# Patient Record
Sex: Male | Born: 1955 | Hispanic: No | Marital: Married | Smoking: Former smoker
Health system: Southern US, Community
[De-identification: ages and names within clinical notes are randomized; demographics above are authoritative.]

## PROBLEM LIST (undated history)

## (undated) DIAGNOSIS — E1169 Type 2 diabetes mellitus with other specified complication: Secondary | ICD-10-CM

## (undated) DIAGNOSIS — M549 Dorsalgia, unspecified: Secondary | ICD-10-CM

## (undated) DIAGNOSIS — M1990 Unspecified osteoarthritis, unspecified site: Secondary | ICD-10-CM

## (undated) DIAGNOSIS — F329 Major depressive disorder, single episode, unspecified: Secondary | ICD-10-CM

## (undated) DIAGNOSIS — F32A Depression, unspecified: Secondary | ICD-10-CM

## (undated) DIAGNOSIS — G4733 Obstructive sleep apnea (adult) (pediatric): Secondary | ICD-10-CM

## (undated) DIAGNOSIS — Z205 Contact with and (suspected) exposure to viral hepatitis: Secondary | ICD-10-CM

## (undated) DIAGNOSIS — F1110 Opioid abuse, uncomplicated: Secondary | ICD-10-CM

## (undated) DIAGNOSIS — K2270 Barrett's esophagus without dysplasia: Secondary | ICD-10-CM

## (undated) DIAGNOSIS — E669 Obesity, unspecified: Secondary | ICD-10-CM

## (undated) DIAGNOSIS — J301 Allergic rhinitis due to pollen: Secondary | ICD-10-CM

## (undated) HISTORY — DX: Depression, unspecified: F32.A

## (undated) HISTORY — PX: ABDOMINAL SURGERY: SHX537

## (undated) HISTORY — PX: BACK SURGERY: SHX140

## (undated) HISTORY — PX: HERNIA REPAIR: SHX51

## (undated) HISTORY — DX: Contact with and (suspected) exposure to viral hepatitis: Z20.5

## (undated) HISTORY — DX: Obstructive sleep apnea (adult) (pediatric): G47.33

## (undated) HISTORY — DX: Barrett's esophagus without dysplasia: K22.70

## (undated) HISTORY — PX: COLONOSCOPY: SHX174

## (undated) HISTORY — PX: ESOPHAGOGASTRODUODENOSCOPY: SHX1529

## (undated) HISTORY — DX: Allergic rhinitis due to pollen: J30.1

## (undated) HISTORY — PX: JOINT REPLACEMENT: SHX530

## (undated) HISTORY — DX: Major depressive disorder, single episode, unspecified: F32.9

## (undated) HISTORY — DX: Opioid abuse, uncomplicated: F11.10

---

## 1998-07-26 ENCOUNTER — Encounter: Payer: Self-pay | Admitting: Emergency Medicine

## 1998-07-26 ENCOUNTER — Emergency Department (HOSPITAL_COMMUNITY): Admission: EM | Admit: 1998-07-26 | Discharge: 1998-07-26 | Payer: Self-pay | Admitting: Emergency Medicine

## 2004-09-25 ENCOUNTER — Encounter (INDEPENDENT_AMBULATORY_CARE_PROVIDER_SITE_OTHER): Payer: Self-pay | Admitting: *Deleted

## 2004-09-25 ENCOUNTER — Ambulatory Visit (HOSPITAL_COMMUNITY): Admission: RE | Admit: 2004-09-25 | Discharge: 2004-09-25 | Payer: Self-pay | Admitting: Gastroenterology

## 2004-09-25 LAB — HM COLONOSCOPY

## 2005-02-20 ENCOUNTER — Encounter: Admission: RE | Admit: 2005-02-20 | Discharge: 2005-02-20 | Payer: Self-pay | Admitting: Family Medicine

## 2005-03-13 ENCOUNTER — Encounter: Admission: RE | Admit: 2005-03-13 | Discharge: 2005-03-13 | Payer: Self-pay | Admitting: Family Medicine

## 2005-03-27 ENCOUNTER — Encounter: Admission: RE | Admit: 2005-03-27 | Discharge: 2005-03-27 | Payer: Self-pay | Admitting: Family Medicine

## 2005-06-05 ENCOUNTER — Encounter: Admission: RE | Admit: 2005-06-05 | Discharge: 2005-06-05 | Payer: Self-pay | Admitting: Family Medicine

## 2007-07-17 HISTORY — PX: TOTAL KNEE ARTHROPLASTY: SHX125

## 2007-09-11 ENCOUNTER — Encounter: Admission: RE | Admit: 2007-09-11 | Discharge: 2007-09-11 | Payer: Self-pay | Admitting: Family Medicine

## 2007-12-17 ENCOUNTER — Inpatient Hospital Stay (HOSPITAL_COMMUNITY): Admission: RE | Admit: 2007-12-17 | Discharge: 2007-12-20 | Payer: Self-pay | Admitting: Orthopedic Surgery

## 2008-01-20 ENCOUNTER — Encounter: Admission: RE | Admit: 2008-01-20 | Discharge: 2008-04-12 | Payer: Self-pay | Admitting: Orthopedic Surgery

## 2008-10-19 ENCOUNTER — Encounter: Admission: RE | Admit: 2008-10-19 | Discharge: 2008-10-19 | Payer: Self-pay | Admitting: Family Medicine

## 2008-11-15 ENCOUNTER — Encounter: Admission: RE | Admit: 2008-11-15 | Discharge: 2008-11-15 | Payer: Self-pay | Admitting: Family Medicine

## 2009-05-10 ENCOUNTER — Encounter: Admission: RE | Admit: 2009-05-10 | Discharge: 2009-05-10 | Payer: Self-pay | Admitting: Family Medicine

## 2010-11-28 NOTE — Op Note (Signed)
NAME:  Robert [name], Robert [name]                 ACCOUNT NO.:  [id number]   MEDICAL RECORD NO.:  [id number]          PATIENT TYPE:  INP   LOCATION:  2550                         FACILITY:  [facility]   PHYSICIAN:  Loreta Ave, M.D. DATE OF BIRTH:  Aug 18, 1955   DATE OF PROCEDURE:  12/17/2007  DATE OF DISCHARGE:                               OPERATIVE REPORT   PREOPERATIVE DIAGNOSES:  1. End-stage degenerative arthritis, left knee.  2. 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  3. Numerous previous operative interventions.  4. Morbid obesity.   POSTOPERATIVE DIAGNOSIS:  1. End-stage degenerative arthritis, left knee.  2. 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  3. Numerous previous operative interventions.  4. Morbid obesity.  5. With significant bony erosions of medial femur and proximal tibia.   PROCEDURE:  Left total knee replacement utilizing Stryker Triathlon  prosthesis, standard approach, cemented pegged posterior stabilized #8  femoral component, cemented #8 tibial component with 11-mm polyethylene  insert, resurfacing 40-mm pegged cemented medial offset patellar  component, and extensive soft tissue balancing and medial capsule  release.   SURGEON:  Loreta Ave, M.D.   ASSISTANT:  Zonia Kief, PA.   I was present throughout the entire case and necessary for timely  completion of procedure.   ANESTHESIA:  General.   BLOOD LOSS:  Minimal.   TOURNIQUET TIME:  1 hour 30 minutes.   SPECIMENS:  None.   COMPLICATIONS:  None.   DRESSING:  Soft compressive with knee immobilizer.   DRAIN:  Hemovac x1.   PROCEDURE:  The patient brought to the operating room, placed on  operating table in supine position.  After adequate anesthesia been  obtained, knee examined.  Positioning and overall approach made much  more difficult by the patient's size, previous deformity, and his morbid  obesity.  A 7-10 degrees flexion contracture.  Alignment more than 5  degrees of  varus, not correctable at all.  Flexion barely 90 degrees.  Tourniquet applied.  Prepped and draped in usual sterile fashion.  Exsanguinated elevation Esmarch, tourniquet inflated to 350 mmHg.  Straight incision above the patella down to the tibial tubercle.  Medial  arthrotomy extending up into the quad tendon.  Knee exposed.  Extensive  spurring throughout, debrided.  Remnants of menisci debrided.  ACL had  marked deficiency spurring at the notch.  Distal femur exposed.  Intramedullary guide placed.  Distal cut resecting 12-mm set at 5  degrees of valgus.  I then did the distal cuts on the femur.  This was  difficult because of the bony deficiency medial and posteromedial.  Using epicondylar axis as much as possible, this was sized, cut, and  fitted for a posterior stabilized #8 femoral component.  This gave good  fitting coverage alignment at completion.  Proximal tibial resection was  of 3 degrees, posterior slope cut with the extramedullary guide getting  just barely below the posteromedial defect.  Good bone stock throughout.  Some sclerotic bone medially treated with multiple drilling, size #8  component.  The patella exposed.  Marked spurs removed.  Posterior 11-mm  removed.  Size drilled and fitted for a 40-mm component.  Generous  medial capsule release already been done.  Trials put in place.  A #8 on  the femur, #8 on the tibia.  With 11-mm insert on the tibial component  and the patellar component in place, I had full extension, full flexion,  nicely balanced knee with good patellofemoral tracking and stability.  Tibia was marked for appropriate rotation and then hand reamed.  All  trials removed.  Copious irrigation with a pulse irrigating device.  Cement prepared and placed on all components, which were all firmly  seated and hammered in place.  Polyethylene attached to tibia.  Patellar  component put in place and held with clamp.  Once cement hardened, then  knee was  reexamined.  Excellent motion with full extension, full flexion  normal mechanical axis, good stability.  Good patellofemoral tracking.  Hemovac placed brought out through a separate stab wound.  Arthrotomy  closed #1 Vicryl.  Skin and subcutaneous tissue with Vicryl and staples.  Knee injected with Marcaine and Hemovac clamp.  Sterile compressive  dressing applied.  Tourniquet deflated and removed.  Knee immobilizer  applied.  Anesthesia reversed.  Brought to recovery room.  Tolerated  surgery well.  No complications.      Loreta Ave, M.D.  Electronically Signed     DFM/MEDQ  D:  12/17/2007  T:  12/18/2007  Job:  [id number]

## 2010-11-28 NOTE — Discharge Summary (Signed)
NAME:  Robert [name], Robert [name]                 ACCOUNT NO.:  [id number]   MEDICAL RECORD NO.:  [id number]          PATIENT TYPE:  INP   LOCATION:  5022                         FACILITY:  [facility]   PHYSICIAN:  Loreta Ave, M.D. DATE OF BIRTH:  01/06/1956   DATE OF ADMISSION:  12/17/2007  DATE OF DISCHARGE:  12/20/2007                               DISCHARGE SUMMARY   FINAL DIAGNOSES:  1. Status post left total knee replacement for end-stage degenerative      joint disease.  2. Morbid obesity.  3. Depression.   HISTORY OF PRESENT ILLNESS:  A 55 year old white male with history of  end-stage DJD left knee and chronic pain presented to our office for  further evaluation for total knee replacement.  He had progressive  worsening pain with failed response to conservative treatment.  Significant decrease in his daily activities due to the ongoing  complaints.   HOSPITAL COURSE:  On December 17, 2007, the patient was taken to the [facility] OR, and a left total knee replacement procedure was performed.   SURGEON:  Loreta Ave, MD   ASSISTANT:  Genene Churn. Barry Dienes, PA-C   ANESTHESIA:  General.   ESTIMATED BLOOD LOSS:  Minimal.   COMPLICATIONS:  None.   DRAIN:  One Hemovac drain placed.   TOURNIQUET TIME:  2 hours.   DISPOSITION:  Transferred to recovery in stable condition.   HOSPITAL COURSE:  On December 18, 2007, the patient was doing well.  Good  pain control.  Temperature 100.8, pulse 115, respirations 16, and blood  pressure 148/98.  Dressing clean, dry, and intact.  Calf nontender and  neurovascularly intact.  Started on Lovenox for DVT prophylaxis.  No  Coumadin will be used secondary to the patient's history of lower GI  bleed.  PT was started.  On December 19, 2007, the patient was doing well.  Temperature 97.8, pulse 101, respirations 16, and blood pressure 147/73.  Hemoglobin 10.4 and hematocrit 29.2.  Wound looks good and staples  intact.  No drainage or signs of infection.  Calf  nontender and  neurovascularly intact.  Hemovac drain discontinued.  Discontinued  Foley.  Saline locked IV.  Ambulating well with therapy.  On December 20, 2007, the patient doing well and states that he is ready to go home.  No  complaints.  Vital signs stable and afebrile.  Hemoglobin 9.5 and  hematocrit 26.9.  Sodium 136, potassium 3.4, chloride 99, CO2 30, BUN  10, creatinine 0.83, and glucose 133.  Wound looks good and staples  intact.  No drainage or signs of infection.   CONDITION:  Good and stable.   DISPOSITION:  Discharged home.   MEDICATIONS:  1. Percocet 10/325 one-to-two tablets p.o. q.4-6 hours p.r.n. for      pain.  2. Robaxin 500 mg one tablet p.o. q.6 hours p.r.n. for spasms.  3. Lovenox 30 mg one subcu injection b.i.d. x4 weeks postoperative for      DVT prophylaxis.   INSTRUCTIONS:  The patient will go home with PT and OT to improve  ambulation and  knee range of motion and strengthening.  Daily dressing  changes with 4 x 4 gauze and tape.  Weightbearing as tolerated.  We will  follow up in 2 weeks postop for a recheck.      Loreta Ave, M.D.  Electronically Signed     Loreta Ave, M.D.  Electronically Signed    DFM/MEDQ  D:  01/26/2008  T:  01/27/2008  Job:  [id number]

## 2010-12-01 NOTE — Op Note (Signed)
NAME:  Robert [name], Robert [name]                 ACCOUNT NO.:  [id number]   MEDICAL RECORD NO.:  [id number]          PATIENT TYPE:  AMB   LOCATION:  ENDO                         FACILITY:  [facility]   PHYSICIAN:  Bernette Redbird, M.D.   DATE OF BIRTH:  1955/08/28   DATE OF PROCEDURE:  09/25/2004  DATE OF DISCHARGE:                                 OPERATIVE REPORT   PROCEDURE:  Colonoscopy with biopsy.   INDICATION:  Hemoccult positive stool in a 55 year old gentleman with  nonspecific upper tract symptoms and 10 pounds weight loss with a negative  endoscopy. Also needs colon cancer screening in view of age.   FINDINGS:  Two small polyps in the proximal colon.   PROCEDURE:  The nature, purpose and risks of the procedure had been  discussed with the patient, who provided written consent. Sedation for this  procedure and the upper endoscopy which preceded it totaled Phenergan 12.5  milligrams IV, fentanyl 140 mcg IV, and Versed 10 milligrams IV without  arrhythmias or desaturation. Digital exam of the prostate as well as  perianal inspection were unremarkable. The Olympus adjustable tension adult  video colonoscope was advanced around the colon, using some external  abdominal compression to reach the cecum which was identified by clear  visualization of the appendiceal orifice. The terminal ileum was not  entered. Pullback was performed.   In and just above the cecum, I encountered total of two small sessile  polyps, each measuring about 2 to 3 mm across, and removed by cold biopsy in  the first instance and probably removed by cold biopsy in second instance  (it was sort of behind a fold and I never could see it again following  biopsy).   No large polyps, cancer, colitis, vascular malformations or diverticulosis  were noted. Retroflexion in the rectum and reinspection of the rectum were  unremarkable except for the presence of a patch of hemorrhagic mucosa the  rectosigmoid junction presumably due  to scope trauma, without evidence of a  tear or laceration or perforation.   The patient tolerated the procedure well and there were no apparent  complications.   IMPRESSION:  1.  Two colon polyps removed as described above (221.3).  2.  No source of heme positive stool identified  3.  No source of weight loss or nonspecific abdominal discomfort identified.   PLAN:  Await pathology results.    RB/MEDQ  D:  09/25/2004  T:  09/25/2004  Job:  [id number]   cc:   Milus Height, PA-C [facility]

## 2010-12-01 NOTE — Op Note (Signed)
NAME:  Robert [name], Robert [name]                 ACCOUNT NO.:  [id number]   MEDICAL RECORD NO.:  [id number]          PATIENT TYPE:  AMB   LOCATION:  ENDO                         FACILITY:  [facility]   PHYSICIAN:  Bernette Redbird, M.D.   DATE OF BIRTH:  1955/09/21   DATE OF PROCEDURE:  09/25/2004  DATE OF DISCHARGE:                                 OPERATIVE REPORT   PROCEDURE:  Upper endoscopy with biopsies.   INDICATION:  A 55 year old gentleman with heme-positive stool, nonspecific  upper abdominal discomfort with food intolerance and 10 pounds weight loss.   FINDINGS:  Short segment Barrett's esophagus.   PROCEDURE:  The nature, purpose, risks of the procedure had been discussed  with the patient provided written consent. Sedation was Phenergan 12.5  milligrams IV, fentanyl 100 mcg IV and Versed 10 milligrams IV. The Olympus  video endoscope was passed under direct vision. The larynx and vocal cords  looked normal. The esophagus was readily entered.   The proximal esophagus was normal, but the distal esophagus had a couple of  tongues of Barrett's mucosa extending about 2-3 cm proximally. These were  biopsied at the conclusion of the procedure. No free reflux, active reflux  esophagitis, ring, stricture, significant hiatal hernia, varices, infection  or neoplasia were observed.   The stomach contained no residual and had normal mucosa without evidence of  gastritis, erosions, ulcers, polyps or masses and a retroflexed view of the  cardia was unremarkable. The pylorus, duodenal bulb and second duodenum  looked normal.   Random mucosal biopsies were obtained from the duodenum and the gastric  antrum and then from the Barrett's segment of the esophagus.   The patient tolerated the procedure well and there no apparent  complications.   IMPRESSION:  1.  Short segment Barrett's esophagus (530.85).  2.  No source of heme positivity or GI symptoms identified.   PLAN:  Await pathology results and  proceed to colonoscopic evaluation.     RB/MEDQ  D:  09/25/2004  T:  09/25/2004  Job:  [id number]   cc:   Dr. [facility]

## 2011-01-12 ENCOUNTER — Other Ambulatory Visit: Payer: Self-pay | Admitting: Family Medicine

## 2011-01-12 DIAGNOSIS — J3489 Other specified disorders of nose and nasal sinuses: Secondary | ICD-10-CM

## 2011-01-16 ENCOUNTER — Ambulatory Visit
Admission: RE | Admit: 2011-01-16 | Discharge: 2011-01-16 | Disposition: A | Discharge status: Home / Self Care | Payer: Medicare Other | Source: Ambulatory Visit | Attending: Family Medicine | Admitting: Family Medicine

## 2011-01-16 DIAGNOSIS — J3489 Other specified disorders of nose and nasal sinuses: Secondary | ICD-10-CM

## 2011-04-11 LAB — CBC
Hemoglobin: 14.9
MCHC: 34.9
Platelets: 199
RBC: 4.65
RDW: 13.9
WBC: 5.9

## 2011-04-11 LAB — URINALYSIS, ROUTINE W REFLEX MICROSCOPIC
Glucose, UA: NEGATIVE
Nitrite: NEGATIVE
Urobilinogen, UA: 1

## 2011-04-11 LAB — COMPREHENSIVE METABOLIC PANEL
CO2: 26
Chloride: 106
Glucose, Bld: 95
Potassium: 4.4
Total Bilirubin: 0.7

## 2011-04-11 LAB — APTT: aPTT: 29

## 2011-04-12 LAB — CBC
HCT: 29.2 — ABNORMAL LOW
HCT: 35.7 — ABNORMAL LOW
Hemoglobin: 12.4 — ABNORMAL LOW
Hemoglobin: 9.5 — ABNORMAL LOW
MCHC: 34.9
MCHC: 35.3
MCHC: 35.7
Platelets: 149 — ABNORMAL LOW
Platelets: 159
Platelets: 211
RBC: 2.91 — ABNORMAL LOW
RBC: 3.15 — ABNORMAL LOW
RBC: 3.87 — ABNORMAL LOW
WBC: 6.1

## 2011-04-12 LAB — BASIC METABOLIC PANEL
BUN: 14
CO2: 27
Calcium: 8.6
Calcium: 8.7
Chloride: 105
Chloride: 99
Creatinine, Ser: 1.13
GFR calc Af Amer: >60
GFR calc Af Amer: >60
GFR calc non Af Amer: >60
GFR calc non Af Amer: >60
Glucose, Bld: 149 — ABNORMAL HIGH
Potassium: 3.4 — ABNORMAL LOW
Potassium: 4.3
Sodium: 136

## 2011-08-31 DIAGNOSIS — M545 Low back pain: Secondary | ICD-10-CM | POA: Diagnosis not present

## 2011-09-19 DIAGNOSIS — IMO0002 Reserved for concepts with insufficient information to code with codable children: Secondary | ICD-10-CM | POA: Diagnosis not present

## 2011-09-19 DIAGNOSIS — M47817 Spondylosis without myelopathy or radiculopathy, lumbosacral region: Secondary | ICD-10-CM | POA: Diagnosis not present

## 2011-09-22 ENCOUNTER — Emergency Department (HOSPITAL_COMMUNITY): Payer: Medicare Other

## 2011-09-22 ENCOUNTER — Inpatient Hospital Stay (HOSPITAL_COMMUNITY)
Admission: EM | Admit: 2011-09-22 | Discharge: 2011-09-25 | DRG: 194 | Disposition: A | Discharge status: Home / Self Care | Payer: Medicare Other | Attending: Internal Medicine | Admitting: Internal Medicine

## 2011-09-22 ENCOUNTER — Inpatient Hospital Stay (HOSPITAL_COMMUNITY): Payer: Medicare Other

## 2011-09-22 ENCOUNTER — Encounter (HOSPITAL_COMMUNITY): Payer: Self-pay

## 2011-09-22 ENCOUNTER — Other Ambulatory Visit: Payer: Self-pay

## 2011-09-22 DIAGNOSIS — R410 Disorientation, unspecified: Secondary | ICD-10-CM

## 2011-09-22 DIAGNOSIS — G8929 Other chronic pain: Secondary | ICD-10-CM | POA: Diagnosis not present

## 2011-09-22 DIAGNOSIS — R112 Nausea with vomiting, unspecified: Secondary | ICD-10-CM

## 2011-09-22 DIAGNOSIS — Z6841 Body Mass Index (BMI) 40.0 and over, adult: Secondary | ICD-10-CM

## 2011-09-22 DIAGNOSIS — K5730 Diverticulosis of large intestine without perforation or abscess without bleeding: Secondary | ICD-10-CM | POA: Diagnosis not present

## 2011-09-22 DIAGNOSIS — R509 Fever, unspecified: Secondary | ICD-10-CM

## 2011-09-22 DIAGNOSIS — G4730 Sleep apnea, unspecified: Secondary | ICD-10-CM | POA: Diagnosis not present

## 2011-09-22 DIAGNOSIS — G4710 Hypersomnia, unspecified: Secondary | ICD-10-CM | POA: Diagnosis not present

## 2011-09-22 DIAGNOSIS — R1032 Left lower quadrant pain: Secondary | ICD-10-CM | POA: Diagnosis not present

## 2011-09-22 DIAGNOSIS — I2789 Other specified pulmonary heart diseases: Secondary | ICD-10-CM | POA: Diagnosis present

## 2011-09-22 DIAGNOSIS — J209 Acute bronchitis, unspecified: Secondary | ICD-10-CM

## 2011-09-22 DIAGNOSIS — R109 Unspecified abdominal pain: Secondary | ICD-10-CM | POA: Diagnosis present

## 2011-09-22 DIAGNOSIS — K5900 Constipation, unspecified: Secondary | ICD-10-CM | POA: Diagnosis present

## 2011-09-22 DIAGNOSIS — G319 Degenerative disease of nervous system, unspecified: Secondary | ICD-10-CM | POA: Diagnosis not present

## 2011-09-22 DIAGNOSIS — E871 Hypo-osmolality and hyponatremia: Secondary | ICD-10-CM | POA: Diagnosis not present

## 2011-09-22 DIAGNOSIS — M545 Low back pain, unspecified: Secondary | ICD-10-CM | POA: Diagnosis not present

## 2011-09-22 DIAGNOSIS — R6883 Chills (without fever): Secondary | ICD-10-CM | POA: Diagnosis not present

## 2011-09-22 DIAGNOSIS — F29 Unspecified psychosis not due to a substance or known physiological condition: Secondary | ICD-10-CM | POA: Diagnosis present

## 2011-09-22 DIAGNOSIS — J189 Pneumonia, unspecified organism: Secondary | ICD-10-CM | POA: Diagnosis present

## 2011-09-22 DIAGNOSIS — J40 Bronchitis, not specified as acute or chronic: Secondary | ICD-10-CM

## 2011-09-22 DIAGNOSIS — G4733 Obstructive sleep apnea (adult) (pediatric): Secondary | ICD-10-CM | POA: Diagnosis present

## 2011-09-22 DIAGNOSIS — M549 Dorsalgia, unspecified: Secondary | ICD-10-CM | POA: Diagnosis not present

## 2011-09-22 DIAGNOSIS — J45909 Unspecified asthma, uncomplicated: Secondary | ICD-10-CM | POA: Diagnosis not present

## 2011-09-22 DIAGNOSIS — E860 Dehydration: Secondary | ICD-10-CM

## 2011-09-22 DIAGNOSIS — R4789 Other speech disturbances: Secondary | ICD-10-CM | POA: Diagnosis not present

## 2011-09-22 DIAGNOSIS — K7689 Other specified diseases of liver: Secondary | ICD-10-CM | POA: Diagnosis not present

## 2011-09-22 DIAGNOSIS — R059 Cough, unspecified: Secondary | ICD-10-CM | POA: Diagnosis not present

## 2011-09-22 DIAGNOSIS — R4182 Altered mental status, unspecified: Secondary | ICD-10-CM | POA: Diagnosis not present

## 2011-09-22 DIAGNOSIS — I7789 Other specified disorders of arteries and arterioles: Secondary | ICD-10-CM | POA: Diagnosis not present

## 2011-09-22 HISTORY — DX: Unspecified osteoarthritis, unspecified site: M19.90

## 2011-09-22 LAB — CBC
HCT: 37.9 % — ABNORMAL LOW (ref 39.0–52.0)
MCHC: 35.4 g/dL (ref 30.0–36.0)
MCV: 90.5 fL (ref 78.0–100.0)
RDW: 13.7 % (ref 11.5–15.5)

## 2011-09-22 LAB — PROCALCITONIN: Procalcitonin: 0.88 ng/mL

## 2011-09-22 LAB — URINALYSIS, ROUTINE W REFLEX MICROSCOPIC
Glucose, UA: NEGATIVE mg/dL
Leukocytes, UA: NEGATIVE
Nitrite: NEGATIVE
Protein, ur: NEGATIVE mg/dL
Urobilinogen, UA: 0.2 mg/dL (ref 0.0–1.0)

## 2011-09-22 LAB — COMPREHENSIVE METABOLIC PANEL
AST: 47 U/L — ABNORMAL HIGH (ref 0–37)
CO2: 25 mEq/L (ref 19–32)
Calcium: 8.8 mg/dL (ref 8.4–10.5)
Creatinine, Ser: 0.71 mg/dL (ref 0.50–1.35)
GFR calc non Af Amer: >90 mL/min (ref >90)

## 2011-09-22 LAB — GLUCOSE, CAPILLARY: Glucose-Capillary: 143 mg/dL — ABNORMAL HIGH (ref 70–99)

## 2011-09-22 LAB — DIFFERENTIAL
Basophils Absolute: 0 10*3/uL (ref 0.0–0.1)
Basophils Relative: 0 % (ref 0–1)
Eosinophils Relative: 2 % (ref 0–5)
Monocytes Absolute: 0.3 10*3/uL (ref 0.1–1.0)

## 2011-09-22 LAB — LIPASE, BLOOD: Lipase: 21 U/L (ref 11–59)

## 2011-09-22 MED ORDER — HYDROMORPHONE HCL PF 1 MG/ML IJ SOLN
1.0000 mg | Freq: Once | INTRAMUSCULAR | Status: AC
  Administered 2011-09-22: 1 mg via INTRAVENOUS
  Filled 2011-09-22: qty 1

## 2011-09-22 MED ORDER — IPRATROPIUM BROMIDE 0.02 % IN SOLN
0.5000 mg | Freq: Once | RESPIRATORY_TRACT | Status: AC
  Administered 2011-09-22: 0.5 mg via RESPIRATORY_TRACT
  Filled 2011-09-22: qty 2.5

## 2011-09-22 MED ORDER — ONDANSETRON HCL 4 MG/2ML IJ SOLN
INTRAMUSCULAR | Status: AC
  Filled 2011-09-22: qty 2

## 2011-09-22 MED ORDER — BISACODYL 5 MG PO TBEC: 5.0000 mg | DELAYED_RELEASE_TABLET | Freq: Every day | ORAL | Status: DC | PRN

## 2011-09-22 MED ORDER — PANTOPRAZOLE SODIUM 40 MG PO TBEC
40.0000 mg | DELAYED_RELEASE_TABLET | Freq: Every day | ORAL | Status: DC
  Administered 2011-09-22 – 2011-09-25 (×4): 40 mg via ORAL
  Filled 2011-09-22 (×3): qty 1

## 2011-09-22 MED ORDER — ONDANSETRON HCL 4 MG/2ML IJ SOLN
4.0000 mg | Freq: Once | INTRAMUSCULAR | Status: AC
  Administered 2011-09-22: 4 mg via INTRAVENOUS
  Filled 2011-09-22: qty 2

## 2011-09-22 MED ORDER — HYDROCODONE-ACETAMINOPHEN 10-325 MG PO TABS
1.0000 | ORAL_TABLET | ORAL | Status: DC | PRN
  Administered 2011-09-22 – 2011-09-24 (×4): 1 via ORAL
  Filled 2011-09-22 (×4): qty 1

## 2011-09-22 MED ORDER — CYCLOBENZAPRINE HCL 10 MG PO TABS
10.0000 mg | ORAL_TABLET | Freq: Once | ORAL | Status: AC
  Administered 2011-09-22: 10 mg via ORAL
  Filled 2011-09-22: qty 1

## 2011-09-22 MED ORDER — SODIUM CHLORIDE 0.9 % IV SOLN
INTRAVENOUS | Status: DC
  Administered 2011-09-22: 125 mL/h via INTRAVENOUS
  Administered 2011-09-23 – 2011-09-24 (×4): via INTRAVENOUS

## 2011-09-22 MED ORDER — DOCUSATE SODIUM 100 MG PO CAPS
100.0000 mg | ORAL_CAPSULE | Freq: Two times a day (BID) | ORAL | Status: DC
  Administered 2011-09-22 – 2011-09-25 (×6): 100 mg via ORAL
  Filled 2011-09-22 (×7): qty 1

## 2011-09-22 MED ORDER — DEXTROSE 5 % IV SOLN
1.0000 g | INTRAVENOUS | Status: DC
  Administered 2011-09-22 – 2011-09-24 (×3): 1 g via INTRAVENOUS
  Filled 2011-09-22 (×4): qty 10

## 2011-09-22 MED ORDER — SODIUM CHLORIDE 0.9 % IV BOLUS (SEPSIS)
2000.0000 mL | Freq: Once | INTRAVENOUS | Status: AC
  Administered 2011-09-22: 2000 mL via INTRAVENOUS

## 2011-09-22 MED ORDER — POLYETHYLENE GLYCOL 3350 17 G PO PACK
17.0000 g | PACK | Freq: Every day | ORAL | Status: DC | PRN
  Filled 2011-09-22: qty 1

## 2011-09-22 MED ORDER — ALBUTEROL SULFATE (5 MG/ML) 0.5% IN NEBU
5.0000 mg | INHALATION_SOLUTION | Freq: Once | RESPIRATORY_TRACT | Status: AC
  Administered 2011-09-22: 5 mg via RESPIRATORY_TRACT
  Filled 2011-09-22: qty 1

## 2011-09-22 MED ORDER — GUAIFENESIN ER 600 MG PO TB12
600.0000 mg | ORAL_TABLET | Freq: Two times a day (BID) | ORAL | Status: DC
  Administered 2011-09-22 – 2011-09-25 (×6): 600 mg via ORAL
  Filled 2011-09-22 (×7): qty 1

## 2011-09-22 MED ORDER — ACETAMINOPHEN 325 MG PO TABS
650.0000 mg | ORAL_TABLET | Freq: Once | ORAL | Status: AC
  Administered 2011-09-22: 650 mg via ORAL
  Filled 2011-09-22: qty 2

## 2011-09-22 MED ORDER — LEVALBUTEROL HCL 0.63 MG/3ML IN NEBU
0.6300 mg | INHALATION_SOLUTION | Freq: Four times a day (QID) | RESPIRATORY_TRACT | Status: DC | PRN
  Filled 2011-09-22: qty 3

## 2011-09-22 MED ORDER — DEXTROSE 5 % IV SOLN
500.0000 mg | INTRAVENOUS | Status: DC
  Administered 2011-09-22: 500 mg via INTRAVENOUS
  Filled 2011-09-22 (×2): qty 500

## 2011-09-22 MED ORDER — DEXTROSE 5 % IV SOLN: 500.0000 mg | Freq: Once | INTRAVENOUS | Status: DC

## 2011-09-22 MED ORDER — ONDANSETRON HCL 4 MG/2ML IJ SOLN
4.0000 mg | Freq: Four times a day (QID) | INTRAMUSCULAR | Status: DC | PRN
  Administered 2011-09-22: 4 mg via INTRAVENOUS
  Filled 2011-09-22: qty 2

## 2011-09-22 MED ORDER — ONDANSETRON HCL 4 MG/2ML IJ SOLN: 4.0000 mg | Freq: Three times a day (TID) | INTRAMUSCULAR | Status: DC | PRN

## 2011-09-22 MED ORDER — CYCLOBENZAPRINE HCL 10 MG PO TABS
5.0000 mg | ORAL_TABLET | Freq: Three times a day (TID) | ORAL | Status: DC | PRN
  Administered 2011-09-23 – 2011-09-24 (×3): 5 mg via ORAL
  Filled 2011-09-22 (×3): qty 1

## 2011-09-22 MED ORDER — DEXTROSE 5 % IV SOLN
1.0000 g | INTRAVENOUS | Status: DC
  Filled 2011-09-22: qty 10

## 2011-09-22 MED ORDER — SENNA 8.6 MG PO TABS
1.0000 | ORAL_TABLET | Freq: Two times a day (BID) | ORAL | Status: DC
  Administered 2011-09-22 – 2011-09-25 (×6): 8.6 mg via ORAL
  Filled 2011-09-22 (×7): qty 1

## 2011-09-22 MED ORDER — SERTRALINE HCL 100 MG PO TABS
200.0000 mg | ORAL_TABLET | Freq: Every day | ORAL | Status: DC
  Administered 2011-09-22 – 2011-09-25 (×4): 200 mg via ORAL
  Filled 2011-09-22 (×4): qty 2

## 2011-09-22 MED ORDER — SODIUM CHLORIDE 0.9 % IV SOLN: INTRAVENOUS | Status: DC

## 2011-09-22 MED ORDER — ONDANSETRON HCL 4 MG PO TABS: 4.0000 mg | ORAL_TABLET | Freq: Four times a day (QID) | ORAL | Status: DC | PRN

## 2011-09-22 MED ORDER — IPRATROPIUM BROMIDE 0.02 % IN SOLN: 0.5000 mg | Freq: Four times a day (QID) | RESPIRATORY_TRACT | Status: DC | PRN

## 2011-09-22 NOTE — ED Notes (Signed)
Pt had back surgery in 2006 and is on Norco for pain.  Pt had constipation and was told to take stool softeners.  Last BM was yesterday.  Pt c/o severe LLQ abdominal pain and chills.  Pt's temp here oral was 98.2.

## 2011-09-22 NOTE — ED Notes (Signed)
IV team returned call for IV start. 

## 2011-09-22 NOTE — H&P (Addendum)
PCP:  No primary provider on file.   DOA:  09/22/2011 10:06 AM  Chief Complaint:  Cough and abdominal pain  HPI: 56 years old morbidly obese pleasant man with history of chronic back pain on narcotics. He presented to the ER today with chief complaint off cough and nasal congestion. productive of yellowish/greenish sputum for about 3 days associated with chills and shortness of breath, he denies any chest pain. Patient is also complaining of constipation and abdominal pain and distention especially in the left lower quadrant. He had one episode of vomiting this morning which was described as clear nonbloody. Patient also will complaining of sinus congestion and frontal headache, he denies any blurring of vision, any motor weakness or numbness. He does have chronic history of back pain since 1980s with no changes or worsening. He was recently seen by his orthopedic surgeon couple of days ago. ER attending reported that patient was confused. Workup in the ED including CT head showed cortical atrophy with no acute changes, KUB showed constipation with no obstructive pattern. Rectal temperature checked was found to be 101.9. Patient was given Rocephin and Zithromax, blood cultures were ordered.  Allergies: No Known Allergies  Prior to Admission medications   Medication Sig Start Date End Date Taking? Authorizing Provider  cyclobenzaprine (FLEXERIL) 10 MG tablet Take 5-10 mg by mouth 3 (three) times daily as needed. For muscle spasms. Starts with 5mg  and takes additional 5mg  if needed.   Yes Historical Provider, MD  HYDROcodone-acetaminophen (NORCO) 10-325 MG per tablet Take 1 tablet by mouth every 4 (four) hours as needed. For pain   Yes Historical Provider, MD  meloxicam (MOBIC) 15 MG tablet Take 15 mg by mouth daily.   Yes Historical Provider, MD  omeprazole (PRILOSEC) 20 MG capsule Take 20 mg by mouth daily.   Yes Historical Provider, MD  sertraline (ZOLOFT) 100 MG tablet Take 200 mg by mouth daily.    Yes Historical Provider, MD    Past Medical History  Diagnosis Date  . Arthritis   . Asthma       Sleep apnea on CPAP  Past Surgical History  Procedure Date  . Back surgery   . Abdominal surgery   . Hernia repair   . Joint replacement     Social History:  This was wife ,reports that he has never smoked. He does not have any smokeless tobacco history on file. He reports that he drinks alcohol. He reports that he does not use illicit drugs.  No family history on file.  Review of Systems:  Constitutional: Denies fever, chills, diaphoresis, appetite change and fatigue.  HEENT: C/O congestion,Denies photophobia, eye pain, redness, hearing loss, ear pain,  sore throat,  mouth sores, trouble swallowing, neck pain, neck stiffness and tinnitus.   Respiratory: C/O SOB, DOE, cough,DENIES chest tightness,  and wheezing.   Cardiovascular: Denies chest pain, palpitations and leg swelling.  Gastrointestinal: C/Onausea, vomiting, abdominal pain,denies diarrhea, constipation, blood in stool and abdominal distention.  Genitourinary: Denies dysuria, urgency, frequency, hematuria, flank pain and difficulty urinating.  Musculoskeletal: C/O back pain, denies  joint swelling, arthralgias and gait problem.  Neurological: Denies dizziness, seizures, syncope, weakness, light-headedness, numbness .C/O headaches.     Physical Exam:  Filed Vitals:   09/22/11 1007 09/22/11 1014 09/22/11 1444 09/22/11 1628  BP: 123/65  126/75   Pulse:   125   Temp: 98.2 F (36.8 C)   101.9 F (38.8 C)  TempSrc: Oral   Rectal  Resp: 16  24  SpO2: 94% 97% 95%     Constitutional: Vital signs reviewed.  Patient is morbidly obese in moderate acute distress and cooperative with exam. Alert and oriented x3.  Head: Normocephalic and atraumatic. No sinus tenderness on exam. Eyes: PERRL, EOMI, conjunctivae normal, No scleral icterus.  Neck: Supple, Trachea midline normal ROM, No JVD, mass, thyromegaly, or carotid bruit  present.  Cardiovascular: RRR, S1 normal, S2 normal, no MRG, pulses symmetric and intact bilaterally Pulmonary/Chest: CTAB, no wheezes, rales, or rhonchi. Decreased breathing sounds bilaterally Abdominal: Soft. Left lower quadrant tenderness with rebound however no guarding, distended, bowel sounds are normal, no masses, organomegaly, or guarding present.  Ext: no edema and no cyanosis, pulses palpable bilaterally (DP and PT) Neurological: A&O x3, Strenght is normal and symmetric bilaterally, cranial nerve II-XII are grossly intact, no focal motor deficit, sensory intact to light touch bilaterally.    Labs on Admission:  Results for orders placed during the hospital encounter of 09/22/11 (from the past 48 hour(s))  CBC     Status: Abnormal   Collection Time   09/22/11 11:31 AM      Component Value Range Comment   WBC 6.7  4.0 - 10.5 (K/uL)    RBC 4.19 (*) 4.22 - 5.81 (MIL/uL)    Hemoglobin 13.4  13.0 - 17.0 (g/dL)    HCT 40.9 (*) 81.1 - 52.0 (%)    MCV 90.5  78.0 - 100.0 (fL)    MCH 32.0  26.0 - 34.0 (pg)    MCHC 35.4  30.0 - 36.0 (g/dL)    RDW 91.4  78.2 - 95.6 (%)    Platelets 144 (*) 150 - 400 (K/uL)   DIFFERENTIAL     Status: Abnormal   Collection Time   09/22/11 11:31 AM      Component Value Range Comment   Neutrophils Relative 88 (*) 43 - 77 (%)    Neutro Abs 5.9  1.7 - 7.7 (K/uL)    Lymphocytes Relative 5 (*) 12 - 46 (%)    Lymphs Abs 0.4 (*) 0.7 - 4.0 (K/uL)    Monocytes Relative 5  3 - 12 (%)    Monocytes Absolute 0.3  0.1 - 1.0 (K/uL)    Eosinophils Relative 2  0 - 5 (%)    Eosinophils Absolute 0.1  0.0 - 0.7 (K/uL)    Basophils Relative 0  0 - 1 (%)    Basophils Absolute 0.0  0.0 - 0.1 (K/uL)   COMPREHENSIVE METABOLIC PANEL     Status: Abnormal   Collection Time   09/22/11 11:31 AM      Component Value Range Comment   Sodium 137  135 - 145 (mEq/L)    Potassium 4.1  3.5 - 5.1 (mEq/L)    Chloride 101  96 - 112 (mEq/L)    CO2 25  19 - 32 (mEq/L)    Glucose, Bld 120 (*)  70 - 99 (mg/dL)    BUN 16  6 - 23 (mg/dL)    Creatinine, Ser 2.13  0.50 - 1.35 (mg/dL)    Calcium 8.8  8.4 - 10.5 (mg/dL)    Total Protein 7.0  6.0 - 8.3 (g/dL)    Albumin 4.3  3.5 - 5.2 (g/dL)    AST 47 (*) 0 - 37 (U/L)    ALT 61 (*) 0 - 53 (U/L)    Alkaline Phosphatase 66  39 - 117 (U/L)    Total Bilirubin 0.6  0.3 - 1.2 (mg/dL)    GFR calc  non Af Amer >90  >90 (mL/min)    GFR calc Af Amer >90  >90 (mL/min)   LIPASE, BLOOD     Status: Normal   Collection Time   09/22/11 11:31 AM      Component Value Range Comment   Lipase 21  11 - 59 (U/L)   URINALYSIS, ROUTINE W REFLEX MICROSCOPIC     Status: Normal   Collection Time   09/22/11 11:34 AM      Component Value Range Comment   Color, Urine YELLOW  YELLOW     APPearance CLEAR  CLEAR     Specific Gravity, Urine 1.025  1.005 - 1.030     pH 5.5  5.0 - 8.0     Glucose, UA NEGATIVE  NEGATIVE (mg/dL)    Hgb urine dipstick NEGATIVE  NEGATIVE     Bilirubin Urine NEGATIVE  NEGATIVE     Ketones, ur NEGATIVE  NEGATIVE (mg/dL)    Protein, ur NEGATIVE  NEGATIVE (mg/dL)    Urobilinogen, UA 0.2  0.0 - 1.0 (mg/dL)    Nitrite NEGATIVE  NEGATIVE     Leukocytes, UA NEGATIVE  NEGATIVE  MICROSCOPIC NOT DONE ON URINES WITH NEGATIVE PROTEIN, BLOOD, LEUKOCYTES, NITRITE, OR GLUCOSE <1000 mg/dL.    Radiological Exams on Admission: Dg Chest 2 View  09/22/2011  *RADIOLOGY REPORT*  Clinical Data: Abdominal pain and chills.  CHEST - 2 VIEW  Comparison: Chest x-ray 10/30/2009.  Findings: Lung volumes are low.  No consolidative airspace disease. No definite pleural effusions.  Prominence of the central pulmonary arteries is noted, which could suggest underlying pulmonary arterial hypertension.  Heart size is mildly enlarged. The patient is rotated to the left on today's exam, resulting in distortion of the mediastinal contours and reduced diagnostic sensitivity and specificity for mediastinal pathology.  IMPRESSION: 1.  No radiographic evidence of acute  cardiopulmonary disease. 2.  Dilatation of the central pulmonary arteries which may suggest pulmonary arterial hypertension. 3.  Mild cardiomegaly.  Original Report Authenticated By: Florencia Reasons, M.D.   Ct Head Wo Contrast  09/22/2011  *RADIOLOGY REPORT*  Clinical Data: Mental status changes.  Speech disturbance.  CT HEAD WITHOUT CONTRAST  Technique:  Contiguous axial images were obtained from the base of the skull through the vertex without contrast.  Comparison: 11/15/2008  Findings: There is no evidence of acute infarction, mass lesion, hemorrhage, hydrocephalus or extra-axial collection.  Some cortical atrophy remains evident.  The calvarium is unremarkable.  There are mild mucosal inflammatory changes of the paranasal sinuses.  IMPRESSION: Cortical atrophy.  No focal or acute finding.  Original Report Authenticated By: Thomasenia Sales, M.D.   Dg Abd 2 Views  09/22/2011  *RADIOLOGY REPORT*  Clinical Data: Abdominal pain.  Evaluate for obstruction.  ABDOMEN - 2 VIEW  Comparison: No priors.  Findings: Supine and left lateral decubitus views of the abdomen demonstrate gas and stool scattered throughout the colon extending to the distal rectum.  No pathologic distension of small bowel.  No gross evidence of pneumoperitoneum.  Sutures and likely related to prior ventral hernia repair are noted projecting over the mid abdomen.  Postoperative changes of PLIF at L4-L5 are incidentally noted.  IMPRESSION:  1.  Nonobstructive bowel gas pattern. 2.  No pneumoperitoneum. 3.  Postoperative changes, as above.  Original Report Authenticated By: Florencia Reasons, M.D.   EKG: Sinus tachycardia rate of 109 no ST changes  Assessment/Plan Active Problems:  Bronchitis//suspected sinusitis/suspected pneumonia.  Confusion  Abdominal pain  Constipation  Chronic back pain  Dehydration  Nausea & vomiting Obstructive sleep apnea. Plan Admit to telemetry Hydrate with IV fluids, antiemetics for nausea vomiting,  keep him clear liquid diet. We'll pain CT scan of the abdomen and pelvis with contrast to evaluate his abdominal pain. Check lactic acid and proalcitonin and follow blood cultures. We'll continue with Rocephin and Zithromax for now. Neb when necessary and mucolytic. Continue CPAP for OSA. Monitor continuous pulse ox. Obtain 2-D echocardiogram. We'll place on  laxative regimen.Continue Norco for pain control Patient is currently at alert and oriented x3 however he has episodes of sleepiness and drowsiness  during interview which is consistent with OSA . No neurological deficit appreciated on exam. CT head showed cortical atrophy with no acute events. DVT prophylaxis CODE STATUS discussed with patient and wife and he  requested full code.  Time Spent on Admission: Approximately 50 minutes  Alanna Storti 09/22/2011, 4:57 PM

## 2011-09-22 NOTE — ED Provider Notes (Signed)
History     CSN: [id number]  Arrival date & time 09/22/11  1006   First MD Initiated Contact with Patient 09/22/11 1040      Chief Complaint  Patient presents with  . Abdominal Pain  . Chills   Level V caveat patient obtained from patient's wife patient refuses to talk.  (Consider location/radiation/quality/duration/timing/severity/associated sxs/prior treatment) HPI  Per wife patient started having cough 3 days ago she states he is productive of mucus that was green now yellow. She denies any fever. He was seen by his orthopedist 3 days ago Dr. Dia Sitter in [place] And he was told to stop his Advil and he was placed on a new possible anti-inflammatory she's not sure. She states that he faces gets red and he is  getting hyper. She states he has chronic constipation and last night took a stool softener he's had nausea and vomiting and states that he's been thirsty for several days. She denies any polyuria. She relates she's having pain in his left lower quadrant that she relates started today. When patient asked to point where he hurts he shakily points to his left lower abdomen. Patient presents via EMS. Wife states she has also had similar symptoms but not as bad.  PCP Dr. Manus Gunning  Past Medical History  Diagnosis Date  . Arthritis   . Asthma    chronic back pain  Past Surgical History  Procedure Date  . Back surgery   . Abdominal surgery   . Hernia repair   . Joint replacement     No family history on file.  History  Substance Use Topics  . Smoking status: Never Smoker   . Smokeless tobacco: Not on file  . Alcohol Use: Yes     rarely  live with spouse On disability   Review of Systems  Unable to perform ROS   Allergies  Review of patient's allergies indicates no known allergies.  Home Medications   Current Outpatient Rx  Name Route Sig Dispense Refill  . CYCLOBENZAPRINE HCL 10 MG PO TABS Oral Take 5-10 mg by mouth 3 (three) times daily as needed. For  muscle spasms. Starts with 5mg  and takes additional 5mg  if needed.    Marland Kitchen HYDROCODONE-ACETAMINOPHEN 10-325 MG PO TABS Oral Take 1 tablet by mouth every 4 (four) hours as needed. For pain    . MELOXICAM 15 MG PO TABS Oral Take 15 mg by mouth daily.    Marland Kitchen OMEPRAZOLE 20 MG PO CPDR Oral Take 20 mg by mouth daily.    . SERTRALINE HCL 100 MG PO TABS Oral Take 200 mg by mouth daily.      BP 123/65  Temp(Src) 98.2 F (36.8 C) (Oral)  Resp 16  SpO2 97%  Vital signs normal     Physical Exam  Nursing note and vitals reviewed. Constitutional: He appears well-developed and well-nourished.  Non-toxic appearance. He does not appear ill. No distress.       Obese, laying on his right side. Patient will only state "I need water"  HENT:  Head: Normocephalic and atraumatic.  Right Ear: External ear normal.  Left Ear: External ear normal.  Nose: Nose normal. No mucosal edema or rhinorrhea.  Mouth/Throat: Mucous membranes are normal. No dental abscesses or uvula swelling.       Tongue dry  Eyes: Conjunctivae and EOM are normal. Pupils are equal, round, and reactive to light.  Neck: Normal range of motion and full passive range of motion without pain. Neck supple.  Cardiovascular: Normal rate, regular rhythm and normal heart sounds.  Exam reveals no gallop and no friction rub.   No murmur heard. Pulmonary/Chest: Effort normal and breath sounds normal. No respiratory distress. He has no wheezes. He has no rhonchi. He has no rales. He exhibits no tenderness and no crepitus.  Abdominal: Soft. Normal appearance and bowel sounds are normal. He exhibits distension. There is no tenderness. There is no rebound and no guarding.       Abdomen feels firm to touch. There are decreased bowel sounds.  Musculoskeletal: Normal range of motion. He exhibits no edema and no tenderness.       Moves all extremities well.   Neurological: He is alert. He has normal strength. No cranial nerve deficit.  Skin: Skin is warm, dry  and intact. No rash noted. No erythema. No pallor.       Face is flushed. Patient has a reddened area in his left lower quadrant which wife relates is a rash however I think it may be where he has his hand resting.  Psychiatric: He has a normal mood and affect. His speech is normal and behavior is normal. His mood appears not anxious.    ED Course  Procedures (including critical care time)   Medications  0.9 %  sodium chloride infusion (not administered)  sodium chloride 0.9 % bolus 2,000 mL (2000 mL Intravenous Given 09/22/11 1345)  cyclobenzaprine (FLEXERIL) tablet 10 mg (not administered)  albuterol (PROVENTIL) (5 MG/ML) 0.5% nebulizer solution 5 mg (not administered)  ipratropium (ATROVENT) nebulizer solution 0.5 mg (not administered)  HYDROmorphone (DILAUDID) injection 1 mg (1 mg Intravenous Given 09/22/11 1214)  ondansetron (ZOFRAN) injection 4 mg (4 mg Intravenous Given 09/22/11 1214)    Pt given IV fluids. He was given dilaudid 1 mg IV.   14:00 pt now talking, states his back is stiff. Has cough that almost sounds like he is having bronchospasm, will try a nebulizer.   15:07 wife states pt is now confused. Pt is now making eye contact, smiles and says inappropriate things, talking about a 10 pound weight. Will get CT of head. Still has some wheezing, will repeat his nebulizer.   Techs report they helped pt to the bathroom and he had a BM but he told them he didn't.   16:29 Dr Cleotis Lema, med-surg triad team 7  Rectal temp shows fever.  Results for orders placed during the hospital encounter of 09/22/11  CBC      Component Value Range   WBC 6.7  4.0 - 10.5 (K/uL)   RBC 4.19 (*) 4.22 - 5.81 (MIL/uL)   Hemoglobin 13.4  13.0 - 17.0 (g/dL)   HCT 16.1 (*) 09.6 - 52.0 (%)   MCV 90.5  78.0 - 100.0 (fL)   MCH 32.0  26.0 - 34.0 (pg)   MCHC 35.4  30.0 - 36.0 (g/dL)   RDW 04.5  40.9 - 81.1 (%)   Platelets 144 (*) 150 - 400 (K/uL)  DIFFERENTIAL      Component Value Range   Neutrophils  Relative 88 (*) 43 - 77 (%)   Neutro Abs 5.9  1.7 - 7.7 (K/uL)   Lymphocytes Relative 5 (*) 12 - 46 (%)   Lymphs Abs 0.4 (*) 0.7 - 4.0 (K/uL)   Monocytes Relative 5  3 - 12 (%)   Monocytes Absolute 0.3  0.1 - 1.0 (K/uL)   Eosinophils Relative 2  0 - 5 (%)   Eosinophils Absolute 0.1  0.0 - 0.7 (K/uL)  Basophils Relative 0  0 - 1 (%)   Basophils Absolute 0.0  0.0 - 0.1 (K/uL)  COMPREHENSIVE METABOLIC PANEL      Component Value Range   Sodium 137  135 - 145 (mEq/L)   Potassium 4.1  3.5 - 5.1 (mEq/L)   Chloride 101  96 - 112 (mEq/L)   CO2 25  19 - 32 (mEq/L)   Glucose, Bld 120 (*) 70 - 99 (mg/dL)   BUN 16  6 - 23 (mg/dL)   Creatinine, Ser 1.61  0.50 - 1.35 (mg/dL)   Calcium 8.8  8.4 - 09.6 (mg/dL)   Total Protein 7.0  6.0 - 8.3 (g/dL)   Albumin 4.3  3.5 - 5.2 (g/dL)   AST 47 (*) 0 - 37 (U/L)   ALT 61 (*) 0 - 53 (U/L)   Alkaline Phosphatase 66  39 - 117 (U/L)   Total Bilirubin 0.6  0.3 - 1.2 (mg/dL)   GFR calc non Af Amer >90  >90 (mL/min)   GFR calc Af Amer >90  >90 (mL/min)  LIPASE, BLOOD      Component Value Range   Lipase 21  11 - 59 (U/L)  URINALYSIS, ROUTINE W REFLEX MICROSCOPIC      Component Value Range   Color, Urine YELLOW  YELLOW    APPearance CLEAR  CLEAR    Specific Gravity, Urine 1.025  1.005 - 1.030    pH 5.5  5.0 - 8.0    Glucose, UA NEGATIVE  NEGATIVE (mg/dL)   Hgb urine dipstick NEGATIVE  NEGATIVE    Bilirubin Urine NEGATIVE  NEGATIVE    Ketones, ur NEGATIVE  NEGATIVE (mg/dL)   Protein, ur NEGATIVE  NEGATIVE (mg/dL)   Urobilinogen, UA 0.2  0.0 - 1.0 (mg/dL)   Nitrite NEGATIVE  NEGATIVE    Leukocytes, UA NEGATIVE  NEGATIVE    Laboratory interpretation all normal   Dg Chest 2 View  09/22/2011  *RADIOLOGY REPORT*  Clinical Data: Abdominal pain and chills.  CHEST - 2 VIEW  Comparison: Chest x-ray 10/30/2009.  Findings: Lung volumes are low.  No consolidative airspace disease. No definite pleural effusions.  Prominence of the central pulmonary arteries is  noted, which could suggest underlying pulmonary arterial hypertension.  Heart size is mildly enlarged. The patient is rotated to the left on today's exam, resulting in distortion of the mediastinal contours and reduced diagnostic sensitivity and specificity for mediastinal pathology.  IMPRESSION: 1.  No radiographic evidence of acute cardiopulmonary disease. 2.  Dilatation of the central pulmonary arteries which may suggest pulmonary arterial hypertension. 3.  Mild cardiomegaly.  Original Report Authenticated By: Florencia Reasons, M.D.   Dg Abd 2 Views  09/22/2011  *RADIOLOGY REPORT*  Clinical Data: Abdominal pain.  Evaluate for obstruction.  ABDOMEN - 2 VIEW  Comparison: No priors.  Findings: Supine and left lateral decubitus views of the abdomen demonstrate gas and stool scattered throughout the colon extending to the distal rectum.  No pathologic distension of small bowel.  No gross evidence of pneumoperitoneum.  Sutures and likely related to prior ventral hernia repair are noted projecting over the mid abdomen.  Postoperative changes of PLIF at L4-L5 are incidentally noted.  IMPRESSION:  1.  Nonobstructive bowel gas pattern. 2.  No pneumoperitoneum. 3.  Postoperative changes, as above.  Original Report Authenticated By: Florencia Reasons, M.D.      1. Chronic back pain   2. Constipation   3. Bronchitis with bronchospasm   4. Confusion   5.  Dehydration   6. Nausea and vomiting   7. Fever    Plan admission Devoria Albe, MD, FACEP    MDM          Ward Givens, MD 09/22/11 1630  Ward Givens, MD 09/22/11 1945

## 2011-09-22 NOTE — ED Notes (Signed)
Paged IV team for IV start after failed attempts to restart line.  Line that EMS place (24 G to right hand) is occluded and would not flush.  Removed that line.

## 2011-09-22 NOTE — ED Notes (Signed)
Started 1L bolus of a 2L bolus order.  Pt only has 1 IV site, 24G to left upper arm.  Will attempt another IV line after pt receives some fluids.

## 2011-09-22 NOTE — ED Notes (Signed)
Pt dozing off during assessment.  Pt easily aroused by saying his name.  Pt states he has not slept well in 2 nights.

## 2011-09-22 NOTE — ED Notes (Signed)
Gave Zofran 4mg  IV to EMS to replace the dose given in the field.

## 2011-09-23 DIAGNOSIS — E860 Dehydration: Secondary | ICD-10-CM | POA: Diagnosis not present

## 2011-09-23 DIAGNOSIS — E871 Hypo-osmolality and hyponatremia: Secondary | ICD-10-CM | POA: Diagnosis not present

## 2011-09-23 DIAGNOSIS — M545 Low back pain: Secondary | ICD-10-CM | POA: Diagnosis not present

## 2011-09-23 DIAGNOSIS — G4710 Hypersomnia, unspecified: Secondary | ICD-10-CM | POA: Diagnosis not present

## 2011-09-23 LAB — GLUCOSE, CAPILLARY: Glucose-Capillary: 115 mg/dL — ABNORMAL HIGH (ref 70–99)

## 2011-09-23 MED ORDER — IOHEXOL 300 MG/ML  SOLN
80.0000 mL | Freq: Once | INTRAMUSCULAR | Status: AC | PRN
  Administered 2011-09-23: 80 mL via INTRAVENOUS

## 2011-09-23 MED ORDER — DEXTROSE 5 % IV SOLN
500.0000 mg | INTRAVENOUS | Status: DC
  Administered 2011-09-23 – 2011-09-24 (×2): 500 mg via INTRAVENOUS
  Filled 2011-09-23 (×4): qty 500

## 2011-09-23 NOTE — Progress Notes (Addendum)
Subjective: Patient seen and examined, feeling much better. He reported improvement in his breathing and improvement in his abdominal pain after he had bowel movement yesterday.  Objective: Vital signs in last 24 hours: Temp:  [97.7 F (36.5 C)-101.9 F (38.8 C)] 97.7 F (36.5 C) (03/10 0516) Pulse Rate:  [91-125] 91  (03/10 0516) Resp:  [18-24] 20  (03/10 0516) BP: (104-139)/(46-80) 139/80 mmHg (03/10 0516) SpO2:  [93 %-97 %] 95 % (03/10 0516) Weight:  [161.4 kg (355 lb 13.2 oz)] 161.4 kg (355 lb 13.2 oz) (03/09 2123) Weight change:  Last BM Date: 09/21/11  Intake/Output from previous day: 03/09 0701 - 03/10 0700 In: 1958.3 [I.V.:1656.3; IV Piggyback:302] Out: 750 [Urine:750]     Physical Exam: General: Alert, awake, oriented x3, in no acute distress. Morbidly obese Heart: Regular rate and rhythm, without murmurs, rubs, gallops. Lungs: Clear to auscultation bilaterally. Abdomen: Soft, mild tenderness in left lower quadrant with no rebound or guarding. distended, positive bowel sounds. Extremities: No clubbing cyanosis or edema with positive pedal pulses. Neuro: Grossly intact, nonfocal.    Lab Results: Results for orders placed during the hospital encounter of 09/22/11 (from the past 24 hour(s))  CBC     Status: Abnormal   Collection Time   09/22/11 11:31 AM      Component Value Range   WBC 6.7  4.0 - 10.5 (K/uL)   RBC 4.19 (*) 4.22 - 5.81 (MIL/uL)   Hemoglobin 13.4  13.0 - 17.0 (g/dL)   HCT 78.2 (*) 95.6 - 52.0 (%)   MCV 90.5  78.0 - 100.0 (fL)   MCH 32.0  26.0 - 34.0 (pg)   MCHC 35.4  30.0 - 36.0 (g/dL)   RDW 21.3  08.6 - 57.8 (%)   Platelets 144 (*) 150 - 400 (K/uL)  DIFFERENTIAL     Status: Abnormal   Collection Time   09/22/11 11:31 AM      Component Value Range   Neutrophils Relative 88 (*) 43 - 77 (%)   Neutro Abs 5.9  1.7 - 7.7 (K/uL)   Lymphocytes Relative 5 (*) 12 - 46 (%)   Lymphs Abs 0.4 (*) 0.7 - 4.0 (K/uL)   Monocytes Relative 5  3 - 12 (%)   Monocytes Absolute 0.3  0.1 - 1.0 (K/uL)   Eosinophils Relative 2  0 - 5 (%)   Eosinophils Absolute 0.1  0.0 - 0.7 (K/uL)   Basophils Relative 0  0 - 1 (%)   Basophils Absolute 0.0  0.0 - 0.1 (K/uL)  COMPREHENSIVE METABOLIC PANEL     Status: Abnormal   Collection Time   09/22/11 11:31 AM      Component Value Range   Sodium 137  135 - 145 (mEq/L)   Potassium 4.1  3.5 - 5.1 (mEq/L)   Chloride 101  96 - 112 (mEq/L)   CO2 25  19 - 32 (mEq/L)   Glucose, Bld 120 (*) 70 - 99 (mg/dL)   BUN 16  6 - 23 (mg/dL)   Creatinine, Ser 4.69  0.50 - 1.35 (mg/dL)   Calcium 8.8  8.4 - 62.9 (mg/dL)   Total Protein 7.0  6.0 - 8.3 (g/dL)   Albumin 4.3  3.5 - 5.2 (g/dL)   AST 47 (*) 0 - 37 (U/L)   ALT 61 (*) 0 - 53 (U/L)   Alkaline Phosphatase 66  39 - 117 (U/L)   Total Bilirubin 0.6  0.3 - 1.2 (mg/dL)   GFR calc non Af Amer >90  >  90 (mL/min)   GFR calc Af Amer >90  >90 (mL/min)  LIPASE, BLOOD     Status: Normal   Collection Time   09/22/11 11:31 AM      Component Value Range   Lipase 21  11 - 59 (U/L)  URINALYSIS, ROUTINE W REFLEX MICROSCOPIC     Status: Normal   Collection Time   09/22/11 11:34 AM      Component Value Range   Color, Urine YELLOW  YELLOW    APPearance CLEAR  CLEAR    Specific Gravity, Urine 1.025  1.005 - 1.030    pH 5.5  5.0 - 8.0    Glucose, UA NEGATIVE  NEGATIVE (mg/dL)   Hgb urine dipstick NEGATIVE  NEGATIVE    Bilirubin Urine NEGATIVE  NEGATIVE    Ketones, ur NEGATIVE  NEGATIVE (mg/dL)   Protein, ur NEGATIVE  NEGATIVE (mg/dL)   Urobilinogen, UA 0.2  0.0 - 1.0 (mg/dL)   Nitrite NEGATIVE  NEGATIVE    Leukocytes, UA NEGATIVE  NEGATIVE   CULTURE, BLOOD (ROUTINE X 2)     Status: Normal (Preliminary result)   Collection Time   09/22/11  5:00 PM      Component Value Range   Specimen Description BLOOD RIGHT ARM     Special Requests       Value: BOTTLES DRAWN AEROBIC AND ANAEROBIC 10CC BLUE 4CC RED   Culture  Setup Time [id number]     Culture       Value:        BLOOD CULTURE  RECEIVED NO GROWTH TO DATE CULTURE WILL BE HELD FOR 5 DAYS BEFORE ISSUING A FINAL NEGATIVE REPORT   Report Status PENDING    LACTIC ACID, PLASMA     Status: Normal   Collection Time   09/22/11  6:49 PM      Component Value Range   Lactic Acid, Venous 1.2  0.5 - 2.2 (mmol/L)  PROCALCITONIN     Status: Normal   Collection Time   09/22/11  6:49 PM      Component Value Range   Procalcitonin 0.88    GLUCOSE, CAPILLARY     Status: Abnormal   Collection Time   09/22/11  7:50 PM      Component Value Range   Glucose-Capillary 143 (*) 70 - 99 (mg/dL)  GLUCOSE, CAPILLARY     Status: Abnormal   Collection Time   09/23/11  8:05 AM      Component Value Range   Glucose-Capillary 122 (*) 70 - 99 (mg/dL)    Studies/Results: Dg Chest 2 View  09/22/2011  *RADIOLOGY REPORT*  Clinical Data: Abdominal pain and chills.  CHEST - 2 VIEW  Comparison: Chest x-ray 10/30/2009.  Findings: Lung volumes are low.  No consolidative airspace disease. No definite pleural effusions.  Prominence of the central pulmonary arteries is noted, which could suggest underlying pulmonary arterial hypertension.  Heart size is mildly enlarged. The patient is rotated to the left on today's exam, resulting in distortion of the mediastinal contours and reduced diagnostic sensitivity and specificity for mediastinal pathology.  IMPRESSION: 1.  No radiographic evidence of acute cardiopulmonary disease. 2.  Dilatation of the central pulmonary arteries which may suggest pulmonary arterial hypertension. 3.  Mild cardiomegaly.  Original Report Authenticated By: Florencia Reasons, M.D.   Ct Head Wo Contrast  09/22/2011  *RADIOLOGY REPORT*  Clinical Data: Mental status changes.  Speech disturbance.  CT HEAD WITHOUT CONTRAST  Technique:  Contiguous axial images were obtained from  the base of the skull through the vertex without contrast.  Comparison: 11/15/2008  Findings: There is no evidence of acute infarction, mass lesion, hemorrhage, hydrocephalus or  extra-axial collection.  Some cortical atrophy remains evident.  The calvarium is unremarkable.  There are mild mucosal inflammatory changes of the paranasal sinuses.  IMPRESSION: Cortical atrophy.  No focal or acute finding.  Original Report Authenticated By: Thomasenia Sales, M.D.   Ct Abdomen Pelvis W Contrast  09/23/2011  *RADIOLOGY REPORT*  Clinical Data: Left lower quadrant abdominal pain  CT ABDOMEN AND PELVIS WITH CONTRAST  Technique:  Multidetector CT imaging of the abdomen and pelvis was performed following the standard protocol during bolus administration of intravenous contrast.  Contrast: 80mL OMNIPAQUE IOHEXOL 300 MG/ML IJ SOLN  Comparison: 09/22/2011 radiograph  Findings: 1.1 cm right hilar density on image 1 may represent volume averaging however the nodule cannot be excluded. The lung bases are otherwise predominately clear.  Heart size within normal limits.  Suboptimal contrast bolus.  Within this limitation, hepatic steatosis without focal lesion identified.  Distended gallbladder without wall thickening or pericholecystic fluid.  No biliary ductal dilatation.  Unremarkable spleen, pancreas, adrenal glands.  Symmetric renal enhancement.  No hydronephrosis or hydroureter.  Moderate stool burden.  Mild colonic diverticulosis.  Appendix not identified however no right lower quadrant inflammation.  No free intraperitoneal air or fluid.  No lymphadenopathy.  Status post anterior abdominal wall hernia repair.  No associated fluid collection.  Thin-walled bladder.  Normal caliber vasculature.  Multilevel degenerative changes of the imaged spine. No acute or aggressive appearing osseous lesion.  The L4-5 posterior fusion hardware.  IMPRESSION: Status post anterior abdominal wall hernia repair.  No bowel obstruction or intraperitoneal fluid.  Hepatic steatosis.  Mild colonic diverticulosis without CT evidence for diverticulitis.  Original Report Authenticated By: Waneta Martins, M.D.   Dg Abd 2  Views  09/22/2011  *RADIOLOGY REPORT*  Clinical Data: Abdominal pain.  Evaluate for obstruction.  ABDOMEN - 2 VIEW  Comparison: No priors.  Findings: Supine and left lateral decubitus views of the abdomen demonstrate gas and stool scattered throughout the colon extending to the distal rectum.  No pathologic distension of small bowel.  No gross evidence of pneumoperitoneum.  Sutures and likely related to prior ventral hernia repair are noted projecting over the mid abdomen.  Postoperative changes of PLIF at L4-L5 are incidentally noted.  IMPRESSION:  1.  Nonobstructive bowel gas pattern. 2.  No pneumoperitoneum. 3.  Postoperative changes, as above.  Original Report Authenticated By: Florencia Reasons, M.D.    Medications:    . acetaminophen  650 mg Oral Once  . albuterol  5 mg Nebulization Once  . albuterol  5 mg Nebulization Once  . azithromycin  500 mg Intravenous Q24H  . cefTRIAXone (ROCEPHIN) IVPB 1 gram/50 mL D5W  1 g Intravenous Q24H  . cefTRIAXone (ROCEPHIN)  IV  1 g Intravenous Q24H  . cyclobenzaprine  10 mg Oral Once  . docusate sodium  100 mg Oral BID  . guaiFENesin  600 mg Oral BID  .  HYDROmorphone (DILAUDID) injection  1 mg Intravenous Once  . ipratropium  0.5 mg Nebulization Once  . ipratropium  0.5 mg Nebulization Once  . ondansetron      . ondansetron (ZOFRAN) IV  4 mg Intravenous Once  . pantoprazole  40 mg Oral Q1200  . senna  1 tablet Oral BID  . sertraline  200 mg Oral Daily  . sodium chloride  2,000  mL Intravenous Once  . DISCONTD: azithromycin  500 mg Intravenous Once    bisacodyl, cyclobenzaprine, HYDROcodone-acetaminophen, iohexol, ipratropium, levalbuterol, ondansetron (ZOFRAN) IV, ondansetron, polyethylene glycol, DISCONTD: ondansetron (ZOFRAN) IV     . sodium chloride 125 mL/hr at 09/23/11 0644  . DISCONTD: sodium chloride      Assessment/Plan:  Active Problems:  Bronchitis/sinusitis/suspected pneumonia. Significant response to Rocephin and Zithromax  noted, will  Continue. Continue nebs when necessary. Repeat chest x-ray tomorrow.  Confusion: Resolved  Abdominal pain: Significantly improved, most likely secondary to constipation. CT abdomen showed diverticulosis with no diverticulitis.  Constipation: Continue laxatives  Chronic back pain: Continue pain control, no neurologic deficits on exam.  Dehydration: Continue IV fluids, decrease rate to 75 cc per hour.  Nausea & vomiting: Resolved, continue Zofran when necessary Obstructive sleep apnea: Continue CPAP each bedtime. Suspected pulmonary hypertension: Obtain 2-D echocardiogram. Disposition: PT consult pending.    LOS: 1 day   Robert [name] 09/23/2011, 10:09 AM

## 2011-09-23 NOTE — Progress Notes (Signed)
Physical Therapy Evaluation Patient Details Name: Robert [name] MRN: [id number] DOB: 20-May-1956 Today's Date: 09/23/2011  Problem List:  Patient Active Problem List  Diagnoses  . Bronchitis  . Confusion  . Abdominal pain  . Constipation  . Chronic back pain  . Dehydration  . Nausea & vomiting    Past Medical History:  Past Medical History  Diagnosis Date  . Arthritis   . Asthma    Past Surgical History:  Past Surgical History  Procedure Date  . Back surgery   . Abdominal surgery   . Hernia repair   . Joint replacement     PT Assessment/Plan/Recommendation PT Assessment Clinical Impression Statement: 56 yo male admitted with AMS, SOB/bronchitis presents to PT with somewhat decr activity tol; will benefit from PT to maximize independence and safety with mobility, activity tolerance, steps to enable safe dc home; Will likely only need 1 more PT session PT Recommendation/Assessment: Patient will need skilled PT in the acute care venue PT Problem List: Decreased activity tolerance;Decreased mobility PT Therapy Diagnosis : Difficulty walking PT Plan PT Frequency: Min 3X/week PT Treatment/Interventions: DME instruction;Gait training;Stair training;Functional mobility training;Patient/family education PT Recommendation Follow Up Recommendations: No PT follow up Equipment Recommended: None recommended by PT PT Goals  Acute Rehab PT Goals PT Goal Formulation: With patient Time For Goal Achievement: 7 days Pt will go Supine/Side to Sit: with modified independence PT Goal: Supine/Side to Sit - Progress: Goal set today Pt will go Sit to Supine/Side: with modified independence PT Goal: Sit to Supine/Side - Progress: Goal set today Pt will go Sit to Stand: with modified independence PT Goal: Sit to Stand - Progress: Goal set today Pt will go Stand to Sit: with modified independence PT Goal: Stand to Sit - Progress: Goal set today Pt will Ambulate: >150 feet;with modified  independence (without shortness of breath) PT Goal: Ambulate - Progress: Goal set today Pt will Go Up / Down Stairs: 3-5 stairs;with modified independence;with rail(s) PT Goal: Up/Down Stairs - Progress: Goal set today  PT Evaluation Precautions/Restrictions    Prior Functioning  Home Living Lives With: Family Receives Help From: Family Type of Home: House Home Layout: One level Home Access: Stairs to enter Entrance Stairs-Rails: Right;Left;Can reach both Secretary/administrator of Steps: 4 Bathroom Shower/Tub: Teacher, adult education: None Prior Function Level of Independence: Independent with homemaking with ambulation Able to Take Stairs?: Yes Driving: Yes Vocation: Full time employment Vocation Requirements: Guitarist Cognition Cognition Arousal/Alertness: Awake/alert Overall Cognitive Status: Appears within functional limits for tasks assessed Orientation Level: Oriented X4 Sensation/Coordination Coordination Gross Motor Movements are Fluid and Coordinated: Yes (slightly limited by body habitus) Fine Motor Movements are Fluid and Coordinated: Yes Extremity Assessment RUE Assessment RUE Assessment: Within Functional Limits LUE Assessment LUE Assessment: Within Functional Limits RLE Assessment RLE Assessment: Within Functional Limits LLE Assessment LLE Assessment: Within Functional Limits Mobility (including Balance) Bed Mobility Bed Mobility: Yes Supine to Sit: 5: Supervision Supine to Sit Details (indicate cue type and reason): cues for safe technique Sit to Supine: 5: Supervision Sit to Supine - Details (indicate cue type and reason): cues for safe technique, and technique to avoid back pain Transfers Transfers: Yes Sit to Stand: 4: Min assist (guard assist, without physical contact) Sit to Stand Details (indicate cue type and reason): cues for safety Stand to Sit: 4: Min assist (guard assist without  physical contact) Stand to Sit Details:  cues to control descent Ambulation/Gait Ambulation/Gait: Yes Ambulation/Gait Assistance: 4: Min assist;5: Supervision Ambulation/Gait  Assistance Details (indicate cue type and reason): Minguard assist at first progressing to supervision; wide stance; cues to self monitor for activity tolerance as we noted some shortness of breath during amb; O2 sats remained >95% on room air Ambulation Distance (Feet): 200 Feet Assistive device: None Gait Pattern: Step-through pattern  Posture/Postural Control Posture/Postural Control: Postural limitations Postural Limitations: Walks with trunk stiff; reports previous back surgeries Exercise    End of Session PT - End of Session Equipment Utilized During Treatment: Gait belt Activity Tolerance: Patient tolerated treatment well Patient left: in bed;with call bell in reach (in bed per pt request) Nurse Communication: Mobility status for transfers;Mobility status for ambulation General Behavior During Session: [facility] for tasks performed Cognition: [facility] for tasks performed  Van Clines [facility] [place], [place] [phone]  09/23/2011, 5:20 PM

## 2011-09-23 NOTE — Progress Notes (Addendum)
1149 09-23-11 RN Charge Synetta Fail called Infectious Disease and they stated that pt will not need nasal swab since placed on droplet precautions. Pt is not from Nursing home- he is from home with wife. Pt has blood cultures pending. Will continue to monitor pt. Gala Lewandowsky, RN,BSN

## 2011-09-23 NOTE — Progress Notes (Signed)
Pt placed on CPAP at previous settings with 2L O2 bleed in. Pt tolerating well. RT will continue to monitor.

## 2011-09-24 ENCOUNTER — Inpatient Hospital Stay (HOSPITAL_COMMUNITY): Payer: Medicare Other

## 2011-09-24 DIAGNOSIS — M545 Low back pain: Secondary | ICD-10-CM | POA: Diagnosis not present

## 2011-09-24 DIAGNOSIS — E860 Dehydration: Secondary | ICD-10-CM | POA: Diagnosis not present

## 2011-09-24 DIAGNOSIS — R509 Fever, unspecified: Secondary | ICD-10-CM | POA: Diagnosis not present

## 2011-09-24 DIAGNOSIS — R05 Cough: Secondary | ICD-10-CM | POA: Diagnosis not present

## 2011-09-24 DIAGNOSIS — G4710 Hypersomnia, unspecified: Secondary | ICD-10-CM | POA: Diagnosis not present

## 2011-09-24 DIAGNOSIS — E871 Hypo-osmolality and hyponatremia: Secondary | ICD-10-CM | POA: Diagnosis not present

## 2011-09-24 DIAGNOSIS — J45909 Unspecified asthma, uncomplicated: Secondary | ICD-10-CM | POA: Diagnosis not present

## 2011-09-24 LAB — MRSA PCR SCREENING: MRSA by PCR: NEGATIVE

## 2011-09-24 MED ORDER — VANCOMYCIN HCL 1000 MG IV SOLR
2000.0000 mg | Freq: Two times a day (BID) | INTRAVENOUS | Status: DC
  Administered 2011-09-24 – 2011-09-25 (×2): 2000 mg via INTRAVENOUS
  Filled 2011-09-24 (×4): qty 2000

## 2011-09-24 NOTE — Clinical Documentation Improvement (Signed)
Pulmonary Hypertension Documentation Clarification Query  THIS DOCUMENT IS NOT A PERMANENT PART OF THE MEDICAL RECORD  TO RESPOND TO THE THIS QUERY, FOLLOW THE INSTRUCTIONS BELOW:  1. If needed, update documentation for the patient's encounter via the notes activity.  2. Access this query again and click edit on the In [employer].  3. After updating, or not, click F2 to complete all highlighted (required) fields concerning your review. Select "additional documentation in the medical record" OR "no additional documentation provided".  4. Click Sign note button.  5. The deficiency will fall out of your In Basket *Please let us know if you are not able to complete this workflow by phone or e-mail (listed below).       09/24/11  Dear Dr.  Cleotis Lema  Marton Redwood  In an effort to better capture your patient's severity of illness, reflect appropriate length of stay and utilization of resources, a review of the patient medical record has revealed indicators for Pulmonary Hypertension.   Based on your clinical judgment, please clarify and document in a progress note and/or discharge summary the clinical condition associated with the following supporting information:  In responding to this query please exercise your independent judgment.  The fact that a query is asked, does not imply that any particular answer is desired or expected.  Possible Clinical Conditions?   ESSENTIAL PULMONARY HYPERTENSION  PRIMARY PULMONARY HYPERTENSION  IDIOPATHIC PULMONARY HYPERTENSION   Other Condition   Cannot Clinically Determine    Supporting Information:  Risk Factors: " Hypertension"   Signs and Symptoms:(As per Notes) "Suspected pulmonary hypertension: Obtain 2-D echocardiogram"  Diagnostics: Radiology:CXR 09-22-11 IMPRESSION: 1.  No radiographic evidence of acute cardiopulmonary disease. 2.  Dilatation of the central pulmonary arteries which may suggest pulmonary arterial hypertension. 3.  Mild  cardiomegaly.  Original Report Authenticated By: Florencia Reasons, M.D.   ECHO: Awaiting Results   You may use possible, probable, or suspect with inpatient documentation. Possible, probable, suspected diagnoses MUST be documented at the time of discharge.  Reviewed:  no additional documentation provided  Thank You,  Joanette Gula Delk RN,BSN Clinical Documentation Specialist: [phone] Pager Health Information Management [place]

## 2011-09-24 NOTE — Progress Notes (Signed)
ANTIBIOTIC CONSULT NOTE - INITIAL  Pharmacy Consult for Vancomycin Indication:  Positive blood cultures  No Known Allergies  Patient Measurements: Height: 6\' 1"  (185.4 cm) Weight: 354 lb 15.1 oz (161 kg) IBW/kg (Calculated) : 79.9   Vital Signs: Temp: 97.8 F (36.6 C) (03/10 2100) Temp src: Axillary (03/10 2100) BP: 135/85 mmHg (03/10 2100) Pulse Rate: 85  (03/10 2100) Intake/Output from previous day: 03/10 0701 - 03/11 0700 In: 3380 [P.O.:1080; I.V.:2000; IV Piggyback:300] Out: 600 [Urine:600] Intake/Output from this shift: Total I/O In: 1780 [P.O.:480; I.V.:1000; IV Piggyback:300] Out: 600 [Urine:600]  Labs:  [facility] 09/22/11 1131  WBC 6.7  HGB 13.4  PLT 144*  LABCREA --  CREATININE 0.71   Estimated Creatinine Clearance: 161.8 ml/min (by C-G formula based on Cr of 0.71).  Microbiology: Recent Results (from the past 720 hour(s))  CULTURE, BLOOD (ROUTINE X 2)     Status: Normal (Preliminary result)   Collection Time   09/22/11  5:00 PM      Component Value Range Status Comment   Specimen Description BLOOD RIGHT ARM   Final    Special Requests     Final    Value: BOTTLES DRAWN AEROBIC AND ANAEROBIC 10CC BLUE 4CC RED   Culture  Setup Time [id number]   Final    Culture     Final    Value:        BLOOD CULTURE RECEIVED NO GROWTH TO DATE CULTURE WILL BE HELD FOR 5 DAYS BEFORE ISSUING A FINAL NEGATIVE REPORT   Report Status PENDING   Incomplete   CULTURE, BLOOD (ROUTINE X 2)     Status: Normal (Preliminary result)   Collection Time   09/22/11  5:34 PM      Component Value Range Status Comment   Specimen Description BLOOD LEFT FOREARM   Final    Special Requests BOTTLES DRAWN AEROBIC AND ANAEROBIC 5CC   Final    Culture  Setup Time [id number]   Final    Culture     Final    Value: GRAM POSITIVE COCCI IN CLUSTERS     Note: Gram Stain Report Called to,Read Back By and Verified With: Melissa Noon RN on 09/24/11 at 01:28 by Christie Nottingham   Report Status PENDING    Incomplete     Medical History: Past Medical History  Diagnosis Date  . Arthritis   . Asthma     Medications:  Anti-infectives     Start     Dose/Rate Route Frequency Ordered Stop   09/23/11 2100   azithromycin (ZITHROMAX) 500 mg in dextrose 5 % 250 mL IVPB        500 mg 250 mL/hr over 60 Minutes Intravenous Every 24 hours 09/23/11 1815     09/23/11 1800   cefTRIAXone (ROCEPHIN) 1 g in dextrose 5 % 50 mL IVPB  Status:  Discontinued        1 g 100 mL/hr over 30 Minutes Intravenous Every 24 hours 09/22/11 1925 09/23/11 1711   09/22/11 2100   azithromycin (ZITHROMAX) 500 mg in dextrose 5 % 250 mL IVPB  Status:  Discontinued        500 mg 250 mL/hr over 60 Minutes Intravenous Every 24 hours 09/22/11 1925 09/23/11 1815   09/22/11 1645   cefTRIAXone (ROCEPHIN) 1 g in dextrose 5 % 50 mL IVPB        1 g 100 mL/hr over 30 Minutes Intravenous Every 24 hours 09/22/11 1638     09/22/11 1645  azithromycin (ZITHROMAX) 500 mg in dextrose 5 % 250 mL IVPB  Status:  Discontinued        500 mg 250 mL/hr over 60 Minutes Intravenous  Once 09/22/11 1638 09/22/11 1939         Assessment: 56 yo male admitted with complaints of cough, nasal congestion and productive sputum.  He has had chills and shortness of breath as well.  One of 2 blood cultures grew Gram positive cocci in clusters and IV Vancomycin will be started until sensitivities are known.  Patient has tolerated Ceftriaxone and Azithromycin without noted complications.  He is morbidly obese, therefore, we will use weight adjusted dosing for him.  Goal of Therapy:  Vancomycin trough level 15-20 mcg/ml  Plan:  Vancomycin 2000 mg IV every 12 hours Will check steady state levels if continued.  Nadara Mustard, PharmD., MS Clinical Pharmacist Pager:  [phone] 09/24/2011,2:08 AM

## 2011-09-24 NOTE — Progress Notes (Signed)
Subjective: Patient seen and examined, feeling a lot better. Reported resolution of his abdominal pain and improvement in his breathing and cough.  Objective: Vital signs in last 24 hours: Temp:  [97.7 F (36.5 C)-98.8 F (37.1 C)] 98.8 F (37.1 C) (03/11 1420) Pulse Rate:  [85-91] 86  (03/11 1420) Resp:  [20] 20  (03/11 1420) BP: (116-158)/(81-94) 158/94 mmHg (03/11 1420) SpO2:  [95 %-98 %] 98 % (03/11 1420) Weight:  [161 kg (354 lb 15.1 oz)] 161 kg (354 lb 15.1 oz) (03/10 2100) Weight change: -0.4 kg (-14.1 oz) Last BM Date: 09/24/11  Intake/Output from previous day: 03/10 0701 - 03/11 0700 In: 4630 [P.O.:1080; I.V.:2750; IV Piggyback:800] Out: 1050 [Urine:1050] Total I/O In: 480 [P.O.:480] Out: 1000 [Urine:1000]   Physical Exam: General: Alert, awake, oriented x3, in no acute distress. Morbidly obese  Heart: Regular rate and rhythm, without murmurs, rubs, gallops.  Lungs: Clear to auscultation bilaterally.  Abdomen: Obese, Soft,  no tenderness. None distended, positive bowel sounds.  Extremities: No clubbing cyanosis or edema with positive pedal pulses.  Neuro: Grossly intact, nonfocal.     Lab Results: Results for orders placed during the hospital encounter of 09/22/11 (from the past 24 hour(s))  GLUCOSE, CAPILLARY     Status: Abnormal   Collection Time   09/23/11  4:38 PM      Component Value Range   Glucose-Capillary 115 (*) 70 - 99 (mg/dL)  GLUCOSE, CAPILLARY     Status: Abnormal   Collection Time   09/23/11  9:25 PM      Component Value Range   Glucose-Capillary 105 (*) 70 - 99 (mg/dL)   Comment 1 Notify RN    MRSA PCR SCREENING     Status: Normal   Collection Time   09/24/11  3:30 AM      Component Value Range   MRSA by PCR NEGATIVE  NEGATIVE     Studies/Results: Ct Head Wo Contrast  09/22/2011  *RADIOLOGY REPORT*  Clinical Data: Mental status changes.  Speech disturbance.  CT HEAD WITHOUT CONTRAST  Technique:  Contiguous axial images were obtained from  the base of the skull through the vertex without contrast.  Comparison: 11/15/2008  Findings: There is no evidence of acute infarction, mass lesion, hemorrhage, hydrocephalus or extra-axial collection.  Some cortical atrophy remains evident.  The calvarium is unremarkable.  There are mild mucosal inflammatory changes of the paranasal sinuses.  IMPRESSION: Cortical atrophy.  No focal or acute finding.  Original Report Authenticated By: Thomasenia Sales, M.D.   Ct Abdomen Pelvis W Contrast  09/23/2011  *RADIOLOGY REPORT*  Clinical Data: Left lower quadrant abdominal pain  CT ABDOMEN AND PELVIS WITH CONTRAST  Technique:  Multidetector CT imaging of the abdomen and pelvis was performed following the standard protocol during bolus administration of intravenous contrast.  Contrast: 80mL OMNIPAQUE IOHEXOL 300 MG/ML IJ SOLN  Comparison: 09/22/2011 radiograph  Findings: 1.1 cm right hilar density on image 1 may represent volume averaging however the nodule cannot be excluded. The lung bases are otherwise predominately clear.  Heart size within normal limits.  Suboptimal contrast bolus.  Within this limitation, hepatic steatosis without focal lesion identified.  Distended gallbladder without wall thickening or pericholecystic fluid.  No biliary ductal dilatation.  Unremarkable spleen, pancreas, adrenal glands.  Symmetric renal enhancement.  No hydronephrosis or hydroureter.  Moderate stool burden.  Mild colonic diverticulosis.  Appendix not identified however no right lower quadrant inflammation.  No free intraperitoneal air or fluid.  No lymphadenopathy.  Status post anterior abdominal wall hernia repair.  No associated fluid collection.  Thin-walled bladder.  Normal caliber vasculature.  Multilevel degenerative changes of the imaged spine. No acute or aggressive appearing osseous lesion.  The L4-5 posterior fusion hardware.  IMPRESSION: Status post anterior abdominal wall hernia repair.  No bowel obstruction or  intraperitoneal fluid.  Hepatic steatosis.  Mild colonic diverticulosis without CT evidence for diverticulitis.  Original Report Authenticated By: Waneta Martins, M.D.    Medications:    . azithromycin  500 mg Intravenous Q24H  . cefTRIAXone (ROCEPHIN) IVPB 1 gram/50 mL D5W  1 g Intravenous Q24H  . docusate sodium  100 mg Oral BID  . guaiFENesin  600 mg Oral BID  . pantoprazole  40 mg Oral Q1200  . senna  1 tablet Oral BID  . sertraline  200 mg Oral Daily  . vancomycin  2,000 mg Intravenous Q12H  . DISCONTD: azithromycin  500 mg Intravenous Q24H  . DISCONTD: cefTRIAXone (ROCEPHIN)  IV  1 g Intravenous Q24H    bisacodyl, cyclobenzaprine, HYDROcodone-acetaminophen, ipratropium, levalbuterol, ondansetron (ZOFRAN) IV, ondansetron, polyethylene glycol     . sodium chloride 125 mL/hr at 09/23/11 2231    Assessment/Plan:  Active Problems:  Bronchitis/sinusitis/suspected pneumonia.  He continued to improve with Rocephin and Zithromax . Continue nebs when necessary.  Repeat chest x-ray today.  Staph Bacteremia: One out of two bottles likely contaminant. Follow identification and sensitivity and continue vancomycin for now. Normal White blood count, lactic acid and PCT all within normal range. Confusion: Resolved  Abdominal pain: Resolved was most likely secondary to constipation. CT abdomen showed diverticulosis with no diverticulitis.  Constipation: Continue laxatives  Chronic back pain: Improved. Continue pain control, no neurologic deficits on exam. Seen by PT no followup recommended. Dehydration: Continue IV fluids, decrease rate to 75 cc per hour.  Nausea & vomiting: Resolved, continue Zofran when necessary  Obstructive sleep apnea: Continue CPAP each bedtime.  Suspected pulmonary hypertension:  2-D echocardiogram pending.  Disposition: To home with wife when stable.  LOS: 2 days   Robert [name] 09/24/2011, 3:06 PM

## 2011-09-24 NOTE — Progress Notes (Signed)
Placed patient on CPAP QHS auto-titration, full face mask and humidity.  Patient tolerating CPAP well.

## 2011-09-24 NOTE — Significant Event (Signed)
Patient's aerobic blood culture came back positive for gram positive cocci in clusters. Benedetto Coons, PA notified - orders given. Will continue to monitor.  Casey Burkitt, RN

## 2011-09-24 NOTE — Progress Notes (Signed)
Pt. Went downstairs for x-ray when he comeback he has a scratch on his left lower leg, the side was cleaned and a Tegaderm was put on,Keep monitoring pt. Closely.

## 2011-09-24 NOTE — Progress Notes (Signed)
Utilization Review Completed.Rexine Gowens T3/05/2012   

## 2011-09-25 DIAGNOSIS — E860 Dehydration: Secondary | ICD-10-CM | POA: Diagnosis not present

## 2011-09-25 DIAGNOSIS — M545 Low back pain: Secondary | ICD-10-CM | POA: Diagnosis not present

## 2011-09-25 DIAGNOSIS — G4730 Sleep apnea, unspecified: Secondary | ICD-10-CM | POA: Diagnosis not present

## 2011-09-25 DIAGNOSIS — E871 Hypo-osmolality and hyponatremia: Secondary | ICD-10-CM | POA: Diagnosis not present

## 2011-09-25 LAB — CBC
HCT: 31.8 % — ABNORMAL LOW (ref 39.0–52.0)
RDW: 13.8 % (ref 11.5–15.5)
WBC: 4.5 10*3/uL (ref 4.0–10.5)

## 2011-09-25 LAB — BASIC METABOLIC PANEL
BUN: 8 mg/dL (ref 6–23)
Chloride: 104 mEq/L (ref 96–112)
GFR calc Af Amer: >90 mL/min (ref >90)
Potassium: 4.1 mEq/L (ref 3.5–5.1)

## 2011-09-25 LAB — CULTURE, BLOOD (ROUTINE X 2): Culture  Setup Time: 201303100141

## 2011-09-25 MED ORDER — SENNA-DOCUSATE SODIUM 8.6-50 MG PO TABS: 2.0000 | ORAL_TABLET | Freq: Every day | ORAL | Status: DC

## 2011-09-25 MED ORDER — GUAIFENESIN ER 600 MG PO TB12: 600.0000 mg | ORAL_TABLET | Freq: Two times a day (BID) | ORAL | Status: DC

## 2011-09-25 MED ORDER — LEVOFLOXACIN 750 MG PO TABS: 750.0000 mg | ORAL_TABLET | Freq: Every day | ORAL | Status: AC

## 2011-09-25 MED ORDER — POLYETHYLENE GLYCOL 3350 17 G PO PACK: 17.0000 g | PACK | Freq: Every day | ORAL | Status: AC | PRN

## 2011-09-25 NOTE — Discharge Summary (Addendum)
Patient ID: Robert [name] MRN: [id number] DOB/AGE: Dec 21, 1955 56 y.o.  Admit date: 09/22/2011 Discharge date: 09/25/2011  Primary Care Physician:  No primary provider on file.   Discharge Diagnoses:    Present on Admission:  .Bronchitis /suspected pneumonia.  Marland Kitchen transient Confusion, resolved.  .Abdominal pain .Constipation .Chronic back pain .Dehydration .Nausea & vomiting  Medication List  As of 09/25/2011 12:16 PM   TAKE these medications         cyclobenzaprine 10 MG tablet   Commonly known as: FLEXERIL   Take 5-10 mg by mouth 3 (three) times daily as needed. For muscle spasms. Starts with 5mg  and takes additional 5mg  if needed.      guaiFENesin 600 MG 12 hr tablet   Commonly known as: MUCINEX   Take 1 tablet (600 mg total) by mouth 2 (two) times daily.      HYDROcodone-acetaminophen 10-325 MG per tablet   Commonly known as: NORCO   Take 1 tablet by mouth every 4 (four) hours as needed. For pain      levofloxacin 750 MG tablet   Commonly known as: LEVAQUIN   Take 1 tablet (750 mg total) by mouth daily.      meloxicam 15 MG tablet   Commonly known as: MOBIC   Take 15 mg by mouth daily.      omeprazole 20 MG capsule   Commonly known as: PRILOSEC   Take 20 mg by mouth daily.      polyethylene glycol packet   Commonly known as: MIRALAX / GLYCOLAX   Take 17 g by mouth daily as needed.      sennosides-docusate sodium 8.6-50 MG tablet   Commonly known as: SENOKOT-S   Take 2 tablets by mouth daily.      sertraline 100 MG tablet   Commonly known as: ZOLOFT   Take 200 mg by mouth daily.             Consults:  None   Significant Diagnostic Studies:  Dg Chest 2 View  09/24/2011  *RADIOLOGY REPORT*  Clinical Data: Cough and fever.  History of asthma.  CHEST - 2 VIEW  Comparison: 09/22/2011.  Findings: Borderline enlarged cardiac silhouette with a mild decrease in size, most likely technical in nature.  Improved inspiration.  The aorta remains tortuous.  Clear  lungs.  Mild diffuse peribronchial thickening.  Mild thoracic spine degenerative changes.  IMPRESSION: Mild bronchitic changes.  Original Report Authenticated By: Darrol Angel, M.D.   Dg Chest 2 View  09/22/2011  *RADIOLOGY REPORT*  Clinical Data: Abdominal pain and chills.  CHEST - 2 VIEW  Comparison: Chest x-ray 10/30/2009.  Findings: Lung volumes are low.  No consolidative airspace disease. No definite pleural effusions.  Prominence of the central pulmonary arteries is noted, which could suggest underlying pulmonary arterial hypertension.  Heart size is mildly enlarged. The patient is rotated to the left on today's exam, resulting in distortion of the mediastinal contours and reduced diagnostic sensitivity and specificity for mediastinal pathology.  IMPRESSION: 1.  No radiographic evidence of acute cardiopulmonary disease. 2.  Dilatation of the central pulmonary arteries which may suggest pulmonary arterial hypertension. 3.  Mild cardiomegaly.  Original Report Authenticated By: Florencia Reasons, M.D.   Ct Head Wo Contrast  09/22/2011  *RADIOLOGY REPORT*  Clinical Data: Mental status changes.  Speech disturbance.  CT HEAD WITHOUT CONTRAST  Technique:  Contiguous axial images were obtained from the base of the skull through the vertex without contrast.  Comparison: 11/15/2008  Findings: There is no evidence of acute infarction, mass lesion, hemorrhage, hydrocephalus or extra-axial collection.  Some cortical atrophy remains evident.  The calvarium is unremarkable.  There are mild mucosal inflammatory changes of the paranasal sinuses.  IMPRESSION: Cortical atrophy.  No focal or acute finding.  Original Report Authenticated By: Thomasenia Sales, M.D.   Ct Abdomen Pelvis W Contrast  09/23/2011  *RADIOLOGY REPORT*  Clinical Data: Left lower quadrant abdominal pain  CT ABDOMEN AND PELVIS WITH CONTRAST  Technique:  Multidetector CT imaging of the abdomen and pelvis was performed following the standard protocol  during bolus administration of intravenous contrast.  Contrast: 80mL OMNIPAQUE IOHEXOL 300 MG/ML IJ SOLN  Comparison: 09/22/2011 radiograph  Findings: 1.1 cm right hilar density on image 1 may represent volume averaging however the nodule cannot be excluded. The lung bases are otherwise predominately clear.  Heart size within normal limits.  Suboptimal contrast bolus.  Within this limitation, hepatic steatosis without focal lesion identified.  Distended gallbladder without wall thickening or pericholecystic fluid.  No biliary ductal dilatation.  Unremarkable spleen, pancreas, adrenal glands.  Symmetric renal enhancement.  No hydronephrosis or hydroureter.  Moderate stool burden.  Mild colonic diverticulosis.  Appendix not identified however no right lower quadrant inflammation.  No free intraperitoneal air or fluid.  No lymphadenopathy.  Status post anterior abdominal wall hernia repair.  No associated fluid collection.  Thin-walled bladder.  Normal caliber vasculature.  Multilevel degenerative changes of the imaged spine. No acute or aggressive appearing osseous lesion.  The L4-5 posterior fusion hardware.  IMPRESSION: Status post anterior abdominal wall hernia repair.  No bowel obstruction or intraperitoneal fluid.  Hepatic steatosis.  Mild colonic diverticulosis without CT evidence for diverticulitis.  Original Report Authenticated By: Waneta Martins, M.D.   Dg Abd 2 Views  09/22/2011  *RADIOLOGY REPORT*  Clinical Data: Abdominal pain.  Evaluate for obstruction.  ABDOMEN - 2 VIEW  Comparison: No priors.  Findings: Supine and left lateral decubitus views of the abdomen demonstrate gas and stool scattered throughout the colon extending to the distal rectum.  No pathologic distension of small bowel.  No gross evidence of pneumoperitoneum.  Sutures and likely related to prior ventral hernia repair are noted projecting over the mid abdomen.  Postoperative changes of PLIF at L4-L5 are incidentally noted.   IMPRESSION:  1.  Nonobstructive bowel gas pattern. 2.  No pneumoperitoneum. 3.  Postoperative changes, as above.  Original Report Authenticated By: Florencia Reasons, M.D.    Brief H and P: For complete details please refer to admission H and P, but in brief  56 years old morbidly obese pleasant man with history of chronic back pain on narcotics. He presented to the ER today with chief complaint off cough and nasal congestion. productive of yellowish/greenish sputum for about 3 days associated with chills and shortness of breath, he denies any chest pain. Patient is also complaining of constipation and abdominal pain and distention especially in the left lower quadrant. He had one episode of vomiting this morning which was described as clear nonbloody. Patient also will complaining of sinus congestion and frontal headache, he denies any blurring of vision, any motor weakness or numbness. He does have chronic history of back pain since 1980s with no changes or worsening. He was recently seen by his orthopedic surgeon couple of days ago. ER attending reported that patient was confused. Workup in the ED including CT head showed cortical atrophy with no acute changes, KUB showed constipation with no obstructive  pattern. Rectal temperature checked was found to be 101.9. Patient was given Rocephin and Zithromax, blood cultures were ordered.   Hospital Course:  Patient was admitted to the medical floor, he was treated with IV Rocephin and Zithromax, nebs when necessary and mucolytic. CT of the abdomen and pelvis was done showed diverticulosis with no diverticulitis. His abdominal pain was felt to be secondary to constipation and he was treated with laxative regimen with resolution of his abdominal pain. One set of blood cultures grew staph aureus , patient was covered with vancomycin however identification of blood cultures showed coagulase-negative staph which was felt to be contaminant given clinical  improvement, absence of fever or leukocytosis,that was discussed with ID(Dr Lucianne Muss who agreed). Patient was kept on CPAP at night and he was compliant and advised to continue as an outpatient. His chest x-ray showed findings suggestive of possible pulmonary hypertension, patient will need echocardiogram as an outpatient. Patient continued to improve and he is stable for discharge to home today. Active Problems:  Bronchitis/sinusitis/suspected pneumonia.  We'll switch IV medication to by mouth Levaquin, continue mucolytics. CNS Staph Bacteremia: One out of two bottles likely contaminant.   Normal White blood count, lactic acid and PCT all within normal range.  Confusion: Resolved  Abdominal pain: Resolved was most likely secondary to constipation. CT abdomen showed diverticulosis with no diverticulitis.  Constipation: Continue laxatives  Chronic back pain: Improved. Continue pain control, no neurologic deficits on exam. Seen by PT no followup recommended.  Dehydration: Resolved, patient was treated with IV fluids.  Nausea & vomiting: Resolved, patient was treated with Zofran when necessary  Obstructive sleep apnea: Continue CPAP each bedtime.  Suspected pulmonary hypertension: Recommend 2-D echocardiogram and outpatient,defer PCP Disposition: To home with wife   Subjective: Patient seen and examined, denies any complaints and wants to go home.    Filed Vitals:   09/25/11 0914  BP: 155/91  Pulse: 74  Temp: 98.4 F (36.9 C)  Resp: 20    General: Alert, awake, oriented x3, in no acute distress. Morbidly obese  Heart: Regular rate and rhythm, without murmurs, rubs, gallops.  Lungs: Clear to auscultation bilaterally.  Abdomen: Obese, Soft, no tenderness. None distended, positive bowel sounds.  Extremities: No clubbing cyanosis or edema with positive pedal pulses.  Neuro: Grossly intact, nonfocal.   Disposition and Follow-up:   (attempted to route this report electronically  to Dr.  Deborah Chalk /PCP was unsuccessful ). Patient was advised to call and make an appointment with his PCP ASAP within one week.   Time spent on Discharge: Approximately 45 minutes   Signed: Lusia Greis 09/25/2011, 12:16 PM

## 2011-09-28 LAB — CULTURE, BLOOD (ROUTINE X 2): Culture: NO GROWTH

## 2011-10-01 DIAGNOSIS — M48061 Spinal stenosis, lumbar region without neurogenic claudication: Secondary | ICD-10-CM | POA: Diagnosis not present

## 2011-10-01 DIAGNOSIS — M5126 Other intervertebral disc displacement, lumbar region: Secondary | ICD-10-CM | POA: Diagnosis not present

## 2011-10-01 DIAGNOSIS — M47817 Spondylosis without myelopathy or radiculopathy, lumbosacral region: Secondary | ICD-10-CM | POA: Diagnosis not present

## 2011-10-01 DIAGNOSIS — M5137 Other intervertebral disc degeneration, lumbosacral region: Secondary | ICD-10-CM | POA: Diagnosis not present

## 2011-10-08 DIAGNOSIS — M961 Postlaminectomy syndrome, not elsewhere classified: Secondary | ICD-10-CM | POA: Diagnosis not present

## 2011-10-08 DIAGNOSIS — M4716 Other spondylosis with myelopathy, lumbar region: Secondary | ICD-10-CM | POA: Diagnosis not present

## 2011-10-08 DIAGNOSIS — M48061 Spinal stenosis, lumbar region without neurogenic claudication: Secondary | ICD-10-CM | POA: Diagnosis not present

## 2011-10-10 DIAGNOSIS — M5430 Sciatica, unspecified side: Secondary | ICD-10-CM | POA: Diagnosis not present

## 2011-10-17 DIAGNOSIS — M545 Low back pain: Secondary | ICD-10-CM | POA: Diagnosis not present

## 2011-10-17 DIAGNOSIS — R262 Difficulty in walking, not elsewhere classified: Secondary | ICD-10-CM | POA: Diagnosis not present

## 2011-10-17 DIAGNOSIS — M48061 Spinal stenosis, lumbar region without neurogenic claudication: Secondary | ICD-10-CM | POA: Diagnosis not present

## 2011-11-01 DIAGNOSIS — R262 Difficulty in walking, not elsewhere classified: Secondary | ICD-10-CM | POA: Diagnosis not present

## 2011-11-01 DIAGNOSIS — M545 Low back pain: Secondary | ICD-10-CM | POA: Diagnosis not present

## 2011-11-01 DIAGNOSIS — M48061 Spinal stenosis, lumbar region without neurogenic claudication: Secondary | ICD-10-CM | POA: Diagnosis not present

## 2011-11-06 DIAGNOSIS — M48061 Spinal stenosis, lumbar region without neurogenic claudication: Secondary | ICD-10-CM | POA: Diagnosis not present

## 2011-11-06 DIAGNOSIS — M545 Low back pain: Secondary | ICD-10-CM | POA: Diagnosis not present

## 2011-11-06 DIAGNOSIS — M5430 Sciatica, unspecified side: Secondary | ICD-10-CM | POA: Diagnosis not present

## 2011-11-06 DIAGNOSIS — R262 Difficulty in walking, not elsewhere classified: Secondary | ICD-10-CM | POA: Diagnosis not present

## 2011-11-08 DIAGNOSIS — M545 Low back pain: Secondary | ICD-10-CM | POA: Diagnosis not present

## 2011-11-08 DIAGNOSIS — M48061 Spinal stenosis, lumbar region without neurogenic claudication: Secondary | ICD-10-CM | POA: Diagnosis not present

## 2011-11-08 DIAGNOSIS — R262 Difficulty in walking, not elsewhere classified: Secondary | ICD-10-CM | POA: Diagnosis not present

## 2011-11-11 ENCOUNTER — Emergency Department (HOSPITAL_COMMUNITY)
Admission: EM | Admit: 2011-11-11 | Discharge: 2011-11-12 | Disposition: A | Discharge status: Home / Self Care | Payer: Medicare Other | Attending: Emergency Medicine | Admitting: Emergency Medicine

## 2011-11-11 ENCOUNTER — Encounter (HOSPITAL_COMMUNITY): Payer: Self-pay | Admitting: Emergency Medicine

## 2011-11-11 ENCOUNTER — Emergency Department (HOSPITAL_COMMUNITY): Payer: Medicare Other

## 2011-11-11 DIAGNOSIS — R509 Fever, unspecified: Secondary | ICD-10-CM | POA: Insufficient documentation

## 2011-11-11 DIAGNOSIS — J45909 Unspecified asthma, uncomplicated: Secondary | ICD-10-CM | POA: Diagnosis not present

## 2011-11-11 DIAGNOSIS — R059 Cough, unspecified: Secondary | ICD-10-CM | POA: Insufficient documentation

## 2011-11-11 DIAGNOSIS — J40 Bronchitis, not specified as acute or chronic: Secondary | ICD-10-CM

## 2011-11-11 DIAGNOSIS — R0602 Shortness of breath: Secondary | ICD-10-CM | POA: Diagnosis not present

## 2011-11-11 DIAGNOSIS — M129 Arthropathy, unspecified: Secondary | ICD-10-CM | POA: Insufficient documentation

## 2011-11-11 DIAGNOSIS — R5381 Other malaise: Secondary | ICD-10-CM | POA: Diagnosis not present

## 2011-11-11 DIAGNOSIS — R05 Cough: Secondary | ICD-10-CM | POA: Insufficient documentation

## 2011-11-11 DIAGNOSIS — J209 Acute bronchitis, unspecified: Secondary | ICD-10-CM | POA: Diagnosis not present

## 2011-11-11 HISTORY — DX: Dorsalgia, unspecified: M54.9

## 2011-11-11 LAB — URINALYSIS, ROUTINE W REFLEX MICROSCOPIC
Bilirubin Urine: NEGATIVE
Glucose, UA: NEGATIVE mg/dL
Ketones, ur: NEGATIVE mg/dL
Leukocytes, UA: NEGATIVE
Specific Gravity, Urine: 1.015 (ref 1.005–1.030)
pH: 7.5 (ref 5.0–8.0)

## 2011-11-11 LAB — BASIC METABOLIC PANEL
BUN: 9 mg/dL (ref 6–23)
CO2: 29 mEq/L (ref 19–32)
Chloride: 99 mEq/L (ref 96–112)
Creatinine, Ser: 0.71 mg/dL (ref 0.50–1.35)

## 2011-11-11 LAB — DIFFERENTIAL
Lymphocytes Relative: 9 % — ABNORMAL LOW (ref 12–46)
Lymphs Abs: 0.5 10*3/uL — ABNORMAL LOW (ref 0.7–4.0)
Monocytes Absolute: 1 10*3/uL (ref 0.1–1.0)
Monocytes Relative: 18 % — ABNORMAL HIGH (ref 3–12)
Neutro Abs: 3.8 10*3/uL (ref 1.7–7.7)

## 2011-11-11 LAB — CBC
HCT: 36.9 % — ABNORMAL LOW (ref 39.0–52.0)
Hemoglobin: 13.2 g/dL (ref 13.0–17.0)
RBC: 4.2 MIL/uL — ABNORMAL LOW (ref 4.22–5.81)
WBC: 5.4 10*3/uL (ref 4.0–10.5)

## 2011-11-11 MED ORDER — SODIUM CHLORIDE 0.9 % IV BOLUS (SEPSIS)
1000.0000 mL | Freq: Once | INTRAVENOUS | Status: AC
  Administered 2011-11-11: 1000 mL via INTRAVENOUS

## 2011-11-11 MED ORDER — PREDNISONE 20 MG PO TABS: 60.0000 mg | ORAL_TABLET | Freq: Every day | ORAL | Status: AC

## 2011-11-11 MED ORDER — SODIUM CHLORIDE 0.9 % IV SOLN
INTRAVENOUS | Status: DC
  Administered 2011-11-11: 23:00:00 via INTRAVENOUS

## 2011-11-11 MED ORDER — AZITHROMYCIN 250 MG PO TABS
500.0000 mg | ORAL_TABLET | Freq: Once | ORAL | Status: AC
  Administered 2011-11-11: 500 mg via ORAL
  Filled 2011-11-11: qty 2

## 2011-11-11 MED ORDER — ALBUTEROL SULFATE (5 MG/ML) 0.5% IN NEBU
5.0000 mg | INHALATION_SOLUTION | Freq: Once | RESPIRATORY_TRACT | Status: AC
  Administered 2011-11-11: 5 mg via RESPIRATORY_TRACT
  Filled 2011-11-11: qty 1

## 2011-11-11 MED ORDER — ALBUTEROL SULFATE HFA 108 (90 BASE) MCG/ACT IN AERS
2.0000 | INHALATION_SPRAY | RESPIRATORY_TRACT | Status: DC | PRN
  Filled 2011-11-11: qty 6.7

## 2011-11-11 MED ORDER — PREDNISONE 20 MG PO TABS
60.0000 mg | ORAL_TABLET | Freq: Once | ORAL | Status: AC
  Administered 2011-11-11: 60 mg via ORAL
  Filled 2011-11-11: qty 3

## 2011-11-11 MED ORDER — AZITHROMYCIN 250 MG PO TABS: 250.0000 mg | ORAL_TABLET | Freq: Every day | ORAL | Status: AC

## 2011-11-11 NOTE — ED Notes (Signed)
Pt stated that yesterday morning, he started having a fever, body aches, and difficulty breathing. Symptoms became worse throughout the day.  He stated that he took his temperature at home and it was 102, rectal. Pt has faint wheezing in all lobes. No sore throat. No CP. No n/v/d. Pt has productive coughing. No blood in mucous. Per pt, mucous is white.

## 2011-11-11 NOTE — ED Notes (Signed)
Patient with fever and shortness of breath that started yesterday.  Patient states he was having fever of 104 rectally at home.

## 2011-11-11 NOTE — ED Provider Notes (Signed)
History     CSN: [id number]  Arrival date & time 11/11/11  2007   First MD Initiated Contact with Patient 11/11/11 2023      Chief Complaint  Patient presents with  . Fever  . Shortness of Breath    (Consider location/radiation/quality/duration/timing/severity/associated sxs/prior treatment) Patient is a 56 y.o. male presenting with fever. The history is provided by the patient. No language interpreter was used.  Fever Primary symptoms of the febrile illness include fever, fatigue, cough and wheezing. Primary symptoms do not include visual change, headaches, shortness of breath, abdominal pain, nausea, vomiting, diarrhea, dysuria or rash. The current episode started yesterday. This is a new problem. The problem has been gradually worsening.  The fever began today. The fever has been gradually worsening since its onset. The maximum temperature recorded prior to his arrival was 102 to 102.9 F. The temperature was taken by a rectal thermometer.  The fatigue began today. The fatigue has been unchanged since its onset.  The cough began today. The cough is dry and productive. The sputum is yellow.    Past Medical History  Diagnosis Date  . Arthritis   . Asthma   . Back pain     Past Surgical History  Procedure Date  . Back surgery   . Abdominal surgery   . Hernia repair   . Joint replacement     History reviewed. No pertinent family history.  History  Substance Use Topics  . Smoking status: Never Smoker   . Smokeless tobacco: Not on file  . Alcohol Use: Yes     rarely      Review of Systems  Constitutional: Positive for fever, chills and fatigue. Negative for activity change and appetite change.  HENT: Negative for congestion, sore throat, rhinorrhea, neck pain and neck stiffness.   Respiratory: Positive for cough and wheezing. Negative for shortness of breath.   Cardiovascular: Negative for chest pain and palpitations.  Gastrointestinal: Negative for nausea,  vomiting, abdominal pain and diarrhea.  Genitourinary: Negative for dysuria, urgency, frequency and flank pain.  Skin: Negative for rash and wound.  Neurological: Negative for dizziness, weakness, light-headedness, numbness and headaches.  All other systems reviewed and are negative.    Allergies  Review of patient's allergies indicates no known allergies.  Home Medications   Current Outpatient Rx  Name Route Sig Dispense Refill  . ACETAMINOPHEN 500 MG PO TABS Oral Take 1,000 mg by mouth every 6 (six) hours as needed. For pain/fever    . CYCLOBENZAPRINE HCL 10 MG PO TABS Oral Take 5-10 mg by mouth 3 (three) times daily as needed. For muscle spasms. Starts with 5mg  and takes additional 5mg  if needed.    Marland Kitchen HYDROCODONE-ACETAMINOPHEN 10-325 MG PO TABS Oral Take 1 tablet by mouth every 4 (four) hours as needed. For pain    . MELOXICAM 15 MG PO TABS Oral Take 15 mg by mouth daily.    . SERTRALINE HCL 100 MG PO TABS Oral Take 200 mg by mouth daily.    . AZITHROMYCIN 250 MG PO TABS Oral Take 1 tablet (250 mg total) by mouth daily. 4 tablet 0  . PREDNISONE 20 MG PO TABS Oral Take 3 tablets (60 mg total) by mouth daily. 15 tablet 0    BP 150/89  Pulse 80  Temp(Src) 100 F (37.8 C) (Rectal)  Resp 18  SpO2 100%  Physical Exam  Nursing note and vitals reviewed. Constitutional: He is oriented to person, place, and time. He appears well-developed  and well-nourished. No distress.  HENT:  Head: Normocephalic and atraumatic.  Right Ear: External ear normal.  Left Ear: External ear normal.  Mouth/Throat: Oropharynx is clear and moist. No oropharyngeal exudate.  Eyes: Conjunctivae and EOM are normal. Pupils are equal, round, and reactive to light.  Neck: Normal range of motion. Neck supple.  Cardiovascular: Normal rate, regular rhythm, normal heart sounds and intact distal pulses.  Exam reveals no gallop and no friction rub.   No murmur heard. Pulmonary/Chest: Effort normal. No respiratory  distress. He has wheezes (diffuse faint end expiratory wheezing).  Abdominal: Soft. Bowel sounds are normal. There is no tenderness.  Musculoskeletal: Normal range of motion. He exhibits no edema and no tenderness.  Lymphadenopathy:    He has no cervical adenopathy.  Neurological: He is alert and oriented to person, place, and time. No cranial nerve deficit.  Skin: Skin is warm and dry. No rash noted.    ED Course  Procedures (including critical care time)  Labs Reviewed  CBC - Abnormal; Notable for the following:    RBC 4.20 (*)    HCT 36.9 (*)    Platelets 137 (*)    All other components within normal limits  DIFFERENTIAL - Abnormal; Notable for the following:    Lymphocytes Relative 9 (*)    Lymphs Abs 0.5 (*)    Monocytes Relative 18 (*)    All other components within normal limits  BASIC METABOLIC PANEL - Abnormal; Notable for the following:    Glucose, Bld 123 (*)    All other components within normal limits  URINALYSIS, ROUTINE W REFLEX MICROSCOPIC   Dg Chest 2 View  11/11/2011  *RADIOLOGY REPORT*  Clinical Data: Shortness of breath, fatigue, fever.  CHEST - 2 VIEW  Comparison: 09/24/2011.  Findings: Aortic arch and ectasia.  Heart size mildly enlarged. Mild central vascular congestion without edema.  No focal consolidation.  No pneumothorax or pleural effusion.  The multilevel degenerative change.  No acute osseous abnormality.  IMPRESSION:  No acute process identified.  Mild cardiomegaly.  Central vascular prominence is similar to prior.  Original Report Authenticated By: Waneta Martins, M.D.     1. Bronchitis       MDM  cxr with no pna.  No additional cause identified for his sx.  Likely bronchitis. Wheezing resolved after nebs x3.  Placed on steroids and abx for purulent sputum and likely copd.  Will dc home on steroids, inhaler and zmax.  Provided strict return precautions        Dayton Bailiff, MD 11/11/11 2350

## 2011-11-11 NOTE — Discharge Instructions (Signed)

## 2011-11-13 DIAGNOSIS — J069 Acute upper respiratory infection, unspecified: Secondary | ICD-10-CM | POA: Diagnosis not present

## 2011-11-15 DIAGNOSIS — M47817 Spondylosis without myelopathy or radiculopathy, lumbosacral region: Secondary | ICD-10-CM | POA: Diagnosis not present

## 2011-11-15 DIAGNOSIS — M545 Low back pain: Secondary | ICD-10-CM | POA: Diagnosis not present

## 2011-11-22 DIAGNOSIS — M545 Low back pain: Secondary | ICD-10-CM | POA: Diagnosis not present

## 2011-11-26 ENCOUNTER — Encounter: Payer: Self-pay | Admitting: Internal Medicine

## 2011-11-27 ENCOUNTER — Ambulatory Visit (INDEPENDENT_AMBULATORY_CARE_PROVIDER_SITE_OTHER): Payer: Medicare Other | Admitting: Internal Medicine

## 2011-11-27 ENCOUNTER — Encounter: Payer: Self-pay | Admitting: Internal Medicine

## 2011-11-27 VITALS — BP 118/66 | HR 73 | Temp 98.1°F | Ht 72.0 in | Wt 337.4 lb

## 2011-11-27 DIAGNOSIS — J4489 Other specified chronic obstructive pulmonary disease: Secondary | ICD-10-CM

## 2011-11-27 DIAGNOSIS — J449 Chronic obstructive pulmonary disease, unspecified: Secondary | ICD-10-CM

## 2011-11-27 DIAGNOSIS — R0600 Dyspnea, unspecified: Secondary | ICD-10-CM | POA: Insufficient documentation

## 2011-11-27 MED ORDER — OMEPRAZOLE 20 MG PO CPDR: DELAYED_RELEASE_CAPSULE | ORAL | Status: DC

## 2011-11-27 NOTE — Assessment & Plan Note (Signed)
When respiratory symptoms begin or become refractory well after a patient reports complete smoking cessation,  Especially when this wasn't the case while they were smoking, a red flag is raised based on the work of Dr Primitivo Gauze which states:  if you quit smoking when your best day FEV1 is still well preserved it is highly unlikely you will progress to severe disease.  That is to say, once the smoking stops,  the symptoms should not suddenly erupt or markedly worsen.  If so, the differential diagnosis should include  obesity/deconditioning,  LPR/Reflux/Aspiration syndromes,  occult CHF, or  especially side effect of medications commonly used in this population.    His situation is complicated in that he has "wheezed all his life" and indeed "wheezed" in the office but found to have classic pseudowheeze pattern while admittedly not on gerd rx even though is MO (obesity/deconditioning) with h/o Barrett's(so has GERD).  Therefore needs aggressive ppi trial plus diet plus f/u with full pft's

## 2011-11-27 NOTE — Progress Notes (Signed)
  Subjective:    Patient ID: Robert [name], male    DOB: 1955/12/06  MRN: [id number]  HPI  63 yowm quit smoking 2006 morbidly obese dx with Barrett's esophagitis with "audible wheezing all his life" but did not slow him down but weighed 240-250 then developed sob since 2010 and referred 11/27/2011 by Dr Manus Gunning for pulmonary evaluation p recent admit as follows  Admit date: 09/22/2011  Discharge date: 09/25/2011  Primary Care Physician: No primary provider on file.  Discharge Diagnoses:  Present on Admission:  .Bronchitis /suspected pneumonia.  Marland Kitchen transient Confusion, resolved.  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 Also having flares of resp failure ? Due to medication reaction most recent hosp April 2013 better p change from narco to ultram.  No longer using ventolin.  Has Barrett's but not using ppi consistently at all and using lots of mints "for my stomach". Has some sense of nasal congestion without spring/fall allergies, no purulent nasal secretions. Not much variability to any of his symptoms.  Sleeping ok without nocturnal  or early am exacerbation  of respiratory  c/o's or need for noct saba. Also denies any obvious fluctuation of symptoms with weather or environmental changes or other aggravating or alleviating factors except as outlined above.       Review of Systems  Constitutional: Negative for fever, chills, activity change, appetite change and unexpected weight change.  HENT: Positive for congestion. Negative for sore throat, rhinorrhea, sneezing, trouble swallowing, dental problem, voice change and postnasal drip.   Eyes: Negative for visual  disturbance.  Respiratory: Positive for shortness of breath. Negative for cough and choking.   Cardiovascular: Negative for chest pain and leg swelling.  Gastrointestinal: Negative for nausea, vomiting and abdominal pain.  Genitourinary: Negative for difficulty urinating.  Musculoskeletal: Negative for arthralgias.  Skin: Negative for rash.  Psychiatric/Behavioral: Negative for behavioral problems and confusion.       Objective:   Physical Exam Pleasant obese amb wm nad Wt 337 11/27/2011  HEENT mild turbinate edema.  Oropharynx no thrush or excess pnd or cobblestoning.  No JVD or cervical adenopathy. Mild accessory muscle hypertrophy. Trachea midline, nl thryroid. Chest was hyperinflated by percussion with diminished breath sounds and moderate increased exp time without wheeze. Hoover sign positive at mid inspiration. Regular rate and rhythm without murmur gallop or rub or increase P2 or edema.  Abd: no hsm, nl excursion. Ext warm without cyanosis or clubbing.      cxr 11/11/11 Aortic arch and ectasia. Heart size mildly enlarged.  Mild central vascular congestion without edema. No focal  consolidation. No pneumothorax or pleural effusion. The  multilevel degenerative change. No acute osseous abnormality.  IMPRESSION:  No acute process identified.      Assessment & Plan:

## 2011-11-27 NOTE — Patient Instructions (Addendum)
Resume omeprazole 20 mg Take 30- 60 min before your first and last meals of the day   GERD (REFLUX)  is an extremely common cause of respiratory symptoms, many times with no significant heartburn at all.    It can be treated with medication, but also with lifestyle changes including avoidance of late meals, excessive alcohol, smoking cessation, and avoid fatty foods, chocolate, peppermint, colas, red wine, and acidic juices such as orange juice.  NO MINT OR MENTHOL PRODUCTS SO NO COUGH DROPS  USE SUGARLESS CANDY INSTEAD (jolley ranchers or Stover's)  NO OIL BASED VITAMINS - use powdered substitutes.   Bring all medication including over the counters with you to next visit - only use your inhaler if you can't catch your breath but bring it with you!  Please schedule a follow up office visit in 6 weeks, call sooner if needed with PFTs

## 2011-12-06 DIAGNOSIS — J45909 Unspecified asthma, uncomplicated: Secondary | ICD-10-CM | POA: Diagnosis not present

## 2011-12-27 DIAGNOSIS — M5430 Sciatica, unspecified side: Secondary | ICD-10-CM | POA: Diagnosis not present

## 2011-12-27 DIAGNOSIS — M545 Low back pain: Secondary | ICD-10-CM | POA: Diagnosis not present

## 2012-01-03 ENCOUNTER — Encounter: Payer: Self-pay | Admitting: Physical Medicine & Rehabilitation

## 2012-01-04 ENCOUNTER — Encounter: Payer: Medicare Other | Attending: Physical Medicine & Rehabilitation

## 2012-01-04 ENCOUNTER — Ambulatory Visit (HOSPITAL_BASED_OUTPATIENT_CLINIC_OR_DEPARTMENT_OTHER): Payer: Medicare Other | Admitting: Physical Medicine & Rehabilitation

## 2012-01-04 ENCOUNTER — Encounter: Payer: Self-pay | Admitting: Physical Medicine & Rehabilitation

## 2012-01-04 VITALS — BP 149/80 | HR 56 | Resp 16 | Ht 71.0 in | Wt 327.0 lb

## 2012-01-04 DIAGNOSIS — IMO0001 Reserved for inherently not codable concepts without codable children: Secondary | ICD-10-CM | POA: Insufficient documentation

## 2012-01-04 DIAGNOSIS — M961 Postlaminectomy syndrome, not elsewhere classified: Secondary | ICD-10-CM | POA: Diagnosis not present

## 2012-01-04 DIAGNOSIS — G8929 Other chronic pain: Secondary | ICD-10-CM | POA: Diagnosis not present

## 2012-01-04 DIAGNOSIS — Z5181 Encounter for therapeutic drug level monitoring: Secondary | ICD-10-CM | POA: Diagnosis not present

## 2012-01-04 DIAGNOSIS — Z79899 Other long term (current) drug therapy: Secondary | ICD-10-CM | POA: Diagnosis not present

## 2012-01-04 DIAGNOSIS — M549 Dorsalgia, unspecified: Secondary | ICD-10-CM

## 2012-01-04 DIAGNOSIS — M545 Low back pain, unspecified: Secondary | ICD-10-CM | POA: Insufficient documentation

## 2012-01-04 DIAGNOSIS — F1110 Opioid abuse, uncomplicated: Secondary | ICD-10-CM | POA: Insufficient documentation

## 2012-01-04 NOTE — Patient Instructions (Addendum)
Recommend that you go to [facility] to help you come off of your oxycodone. You may benefit from lumbar medial branch blocks to treat the arthritis in the lumbar facet joints above your L4-L5 fusion. The usual protocol is 2 sets of medial branch blocks. If you get greater than 50% relief on both occasions then there is another procedure called radiofrequency neurotomy that may be beneficial for up to 12 months. I will not be prescribing a narcotic analgesics for reasons that we have discussed.

## 2012-01-04 NOTE — Progress Notes (Signed)
Subjective:    Patient ID: Robert [name], male    DOB: [birth year], 56 y.o.   MRN: [id number]  HPI History of low back pain since at least 2006. Has undergone an L4-L5 laminectomy. Last MRI was performed on 10/01/2011 showing the postoperative level as well as some surrounding scar. No other nerve root compression was seen. There is evidence of fusion at L4-L5 level. There was facet hypertrophy at L2-L3, L3-L4, L5-S1 levels. He should also complaining of pain in the right buttocks as well as right anterior thigh area. As tried physical therapy and was limited by financial concerns. Has been taking pain medicine. Head problems postoperatively taking too much pain medicine. Has been off the pain medicine but then placed back on Norco and has taken 87 tablets in the last 8 days. The prescription dose was for no more than 4 tablets per day.Injections with Dr Alois Cliche last injection on 6/13 Pain Inventory Average Pain 6 Pain Right Now 7 My pain is constant, sharp, stabbing, tingling and aching  In the last 24 hours, has pain interfered with the following? General activity 7 Relation with others 5 Enjoyment of life 7 What TIME of day is your pain at its worst? morning and evening Sleep (in general) Poor  Pain is worse with: walking, bending, sitting and standing Pain improves with: medication and injections Relief from Meds: 5  Mobility use a cane how many minutes can you walk? 10-15 ability to climb steps?  yes do you drive?  yes  Function disabled: date disabled 2006 I need assistance with the following:  household duties and shopping  Neuro/Psych weakness numbness tingling spasms depression  Prior Studies x-rays CT/MRI  Physicians involved in your care Orthopedist Dr Myer Peer   Family History  Problem Relation Age of Onset  . Allergies Mother    History   Social History  . Marital Status: Married    Spouse Name: N/A    Number of Children: 2  . Years of Education:  N/A   Occupational History  . Mucician, Disabled    Social History Main Topics  . Smoking status: Former Smoker -- 0.8 packs/day for 15 years    Types: Cigarettes    Quit date: 07/16/2004  . Smokeless tobacco: Never Used  . Alcohol Use: Yes     occ  . Drug Use: No  . Sexually Active: None   Other Topics Concern  . None   Social History Narrative  . None   Past Surgical History  Procedure Date  . Back surgery   . Abdominal surgery   . Hernia repair   . Joint replacement   . Total knee arthroplasty 2009    left   Past Medical History  Diagnosis Date  . Arthritis   . Asthma   . Back pain   . Barrett esophagus   . Exposure to hepatitis B   . Exposure to hepatitis C   . Narcotic abuse   . OSA (obstructive sleep apnea)     on CPAP   . Depressive disorder   . Allergic rhinitis due to pollen    BP 149/80  Pulse 56  Resp 16  Ht 5\' 11"  (1.803 m)  Wt 327 lb (148.326 kg)  BMI 45.61 kg/m2  SpO2 93%     Review of Systems  Constitutional: Positive for unexpected weight change.  Respiratory: Positive for shortness of breath and wheezing.   Musculoskeletal: Positive for back pain.  Neurological: Positive for weakness and numbness.  Hematological: Bruises/bleeds easily.  Psychiatric/Behavioral: Positive for dysphoric mood.  All other systems reviewed and are negative.       Objective:   Physical Exam  Constitutional: He is oriented to person, place, and time.  Musculoskeletal:       Lumbar back: He exhibits decreased range of motion, tenderness and pain. He exhibits no deformity and no spasm.       Good lumbar flexion limited lumbar extension pain with lateral bending to both sides  Neurological: He is alert and oriented to person, place, and time. He has normal strength. A sensory deficit is present. Gait abnormal.  Reflex Scores:      Tricep reflexes are 2+ on the right side and 2+ on the left side.      Bicep reflexes are 2+ on the right side and 2+ on the  left side.      Brachioradialis reflexes are 2+ on the right side and 2+ on the left side.      Patellar reflexes are 2+ on the right side and 2+ on the left side.      Achilles reflexes are 2+ on the right side and 2+ on the left side.      Left L4, left L5 reduction pin prick  Psychiatric: His mood appears anxious.          Assessment & Plan:  1. Lumbar postlaminectomy syndrome with chronic pain. I think a multimodal approach is appropriate and think that lumbar medial branch blocks above the level of the fusion may be helpful to alleviate the low back pain. In regards to narcotic pain medications, the patient is clearly overusing pain medication and has done so on more than one occasion. I recommend treatment for prescription drug abuse. Nonnarcotic pain management after completion of an outpatient program. Discussed with patient and have made a referral to [facility].  The patient agrees with this treatment plan

## 2012-01-08 DIAGNOSIS — M47817 Spondylosis without myelopathy or radiculopathy, lumbosacral region: Secondary | ICD-10-CM | POA: Diagnosis not present

## 2012-01-08 DIAGNOSIS — M961 Postlaminectomy syndrome, not elsewhere classified: Secondary | ICD-10-CM | POA: Diagnosis not present

## 2012-01-15 ENCOUNTER — Ambulatory Visit: Payer: Medicare Other | Admitting: Internal Medicine

## 2012-02-01 DIAGNOSIS — M545 Low back pain: Secondary | ICD-10-CM | POA: Diagnosis not present

## 2012-02-01 DIAGNOSIS — G8921 Chronic pain due to trauma: Secondary | ICD-10-CM | POA: Diagnosis not present

## 2012-02-01 DIAGNOSIS — M79609 Pain in unspecified limb: Secondary | ICD-10-CM | POA: Diagnosis not present

## 2012-02-01 DIAGNOSIS — Z79899 Other long term (current) drug therapy: Secondary | ICD-10-CM | POA: Diagnosis not present

## 2012-02-01 DIAGNOSIS — Z5181 Encounter for therapeutic drug level monitoring: Secondary | ICD-10-CM | POA: Diagnosis not present

## 2012-02-08 DIAGNOSIS — G8921 Chronic pain due to trauma: Secondary | ICD-10-CM | POA: Diagnosis not present

## 2012-02-08 DIAGNOSIS — M545 Low back pain: Secondary | ICD-10-CM | POA: Diagnosis not present

## 2012-02-08 DIAGNOSIS — M47817 Spondylosis without myelopathy or radiculopathy, lumbosacral region: Secondary | ICD-10-CM | POA: Diagnosis not present

## 2012-02-15 ENCOUNTER — Ambulatory Visit (INDEPENDENT_AMBULATORY_CARE_PROVIDER_SITE_OTHER): Payer: Medicare Other | Admitting: Internal Medicine

## 2012-02-15 ENCOUNTER — Encounter: Payer: Self-pay | Admitting: Internal Medicine

## 2012-02-15 VITALS — BP 120/70 | HR 86 | Temp 97.6°F | Ht 74.0 in | Wt 325.0 lb

## 2012-02-15 DIAGNOSIS — J449 Chronic obstructive pulmonary disease, unspecified: Secondary | ICD-10-CM

## 2012-02-15 DIAGNOSIS — R0989 Other specified symptoms and signs involving the circulatory and respiratory systems: Secondary | ICD-10-CM

## 2012-02-15 DIAGNOSIS — R0600 Dyspnea, unspecified: Secondary | ICD-10-CM

## 2012-02-15 LAB — PULMONARY FUNCTION TEST

## 2012-02-15 NOTE — Assessment & Plan Note (Signed)
Cal balance issues reviewed See instructions for specific recommendations which were reviewed directly with the patient who was given a copy with highlighter outlining the key components.

## 2012-02-15 NOTE — Progress Notes (Signed)
Subjective:    Patient ID: Robert [name], male    DOB: [birth year]  MRN: [id number]  HPI  38 yowm quit smoking 2006 morbidly obese dx with Barrett's esophagitis with "audible wheezing all his life" but did not slow him down but weighed 240-250 then developed sob since 2010 and referred 11/27/2011 by Dr Manus Gunning for pulmonary evaluation p recent admit as follows  Admit date: 09/22/2011  Discharge date: 09/25/2011  Primary Care Physician: No primary provider on file.  Discharge Diagnoses:  Present on Admission:  .Bronchitis /suspected pneumonia.  Marland Kitchen transient Confusion, resolved.  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 Also having flares of resp failure ? Due to medication reaction most recent hosp April 2013 better p change from narco to ultram.  No longer using ventolin.  Has Barrett's but not using ppi consistently at all and using lots of mints "for my stomach". Has some sense of nasal congestion without spring/fall allergies, no purulent nasal secretions. Not much variability to any of his symptoms. rec Resume omeprazole 20 mg Take 30- 60 min before your first and last meals of the day  GERD diet Continue qvar   02/15/2012 f/u ov/Alinda Egolf cc much better sob but not consistently taking ppi bid despite dx of Barrett's but does confirm he understands this is important.   No unusual cough, purulent sputum or sinus/hb symptoms on present rx.   Sleeping ok without nocturnal  or early am exacerbation  of respiratory  c/o's or need for noct saba. Also denies any obvious fluctuation of symptoms with weather or environmental changes or other aggravating or alleviating factors except  as outlined above.  ROS  The following are not active complaints unless bolded sore throat, dysphagia, dental problems, itching, sneezing,  nasal congestion or excess/ purulent secretions, ear ache,   fever, chills, sweats, unintended wt loss, pleuritic or exertional cp, hemoptysis,  orthopnea pnd or leg swelling, presyncope, palpitations, heartburn, abdominal pain, anorexia, nausea, vomiting, diarrhea  or change in bowel or urinary habits, change in stools or urine, dysuria,hematuria,  rash, arthralgias, visual complaints, headache, numbness weakness or ataxia or problems with walking or coordination,  change in mood/affect or memory.                  Objective:   Physical Exam Pleasant obese amb wm nad with occ throat clearing  Wt 337 11/27/2011 > 02/15/2012  326 HEENT mild turbinate edema.  Oropharynx no thrush or excess pnd or cobblestoning.  No JVD or cervical adenopathy. Mild accessory muscle hypertrophy. Trachea midline, nl thryroid. Chest was hyperinflated by percussion with diminished breath sounds and moderate increased exp time without wheeze. Hoover sign positive at mid inspiration. Regular rate and rhythm without murmur gallop or rub or increase P2 or edema.  Abd: no hsm, nl excursion. Ext warm without cyanosis or clubbing.      cxr 11/11/11 Aortic arch and ectasia. Heart size mildly enlarged.  Mild central vascular congestion without edema. No focal  consolidation. No pneumothorax or pleural effusion. The  multilevel degenerative change. No acute osseous abnormality.  IMPRESSION:  No acute process identified.      Assessment &  Plan:

## 2012-02-15 NOTE — Assessment & Plan Note (Signed)
-   PFT's 02/15/2012 FEV1  3.60 (97%) ratio 72 and no change p B2,  ERV 41, dlco 73 corrects to 109  I had an extended summary discussion with the patient today lasting 15 to 20 minutes of a 25 minute visit on the following issues:   So he does not have sign copd but could still have asthma - the main problem on pft's is obesity which also places him at risk of bad GERD  The proper method of use, as well as anticipated side effects, of a metered-dose inhaler are discussed and demonstrated to the patient. Improved effectiveness after extensive coaching during this visit to a level of approximately  75%  Ok to stop qvar at this point and focus on rx of GERD and see if any of his "wheeze"  Or sob recur and if so immediately resume qvar and then if not controlled return here, otherwise f/u can be prn

## 2012-02-15 NOTE — Patient Instructions (Addendum)
Maintain on prilosec (or omeprazole) Take 30- 60 min before your first and last meals of the day   Stop qvar after a few weeks and see if any respiratory symptoms flare or you start needing your rescue w/in a week or two and if so resume it.  Your main respiratory problem is your weight. Weight control is simply a matter of calorie balance which needs to be tilted in your favor by eating less and exercising more.  To get the most out of exercise, you need to be continuously aware that you are short of breath, but never out of breath, for 30 minutes daily. As you improve, it will actually be easier for you to do the same amount of exercise  in  30 minutes so always push to the level where you are short of breath.  If this does not result in gradual weight reduction then I strongly recommend you see a nutritionist with a food diary x 2 weeks so that we can work out a negative calorie balance which is universally effective in steady weight loss programs.  Think of your calorie balance like you do your bank account where in this case you want the balance to go down so you must take in less calories than you burn up.  It's just that simple:  Hard to do, but easy to understand.  Good luck!    If you are satisfied with your treatment plan let your doctor know and he/she can either refill your medications or you can return here when your prescription runs out.     If in any way you are not 100% satisfied,  please tell us.  If 100% better, tell your friends!

## 2012-02-15 NOTE — Progress Notes (Signed)
PFT done today. 

## 2012-02-27 DIAGNOSIS — M545 Low back pain, unspecified: Secondary | ICD-10-CM | POA: Diagnosis not present

## 2012-02-27 DIAGNOSIS — IMO0002 Reserved for concepts with insufficient information to code with codable children: Secondary | ICD-10-CM | POA: Diagnosis not present

## 2012-02-27 DIAGNOSIS — E669 Obesity, unspecified: Secondary | ICD-10-CM | POA: Diagnosis not present

## 2012-03-07 DIAGNOSIS — G8921 Chronic pain due to trauma: Secondary | ICD-10-CM | POA: Diagnosis not present

## 2012-03-07 DIAGNOSIS — M545 Low back pain: Secondary | ICD-10-CM | POA: Diagnosis not present

## 2012-03-07 DIAGNOSIS — M961 Postlaminectomy syndrome, not elsewhere classified: Secondary | ICD-10-CM | POA: Diagnosis not present

## 2012-04-14 DIAGNOSIS — M545 Low back pain: Secondary | ICD-10-CM | POA: Diagnosis not present

## 2012-04-14 DIAGNOSIS — IMO0002 Reserved for concepts with insufficient information to code with codable children: Secondary | ICD-10-CM | POA: Diagnosis not present

## 2012-04-14 DIAGNOSIS — M5137 Other intervertebral disc degeneration, lumbosacral region: Secondary | ICD-10-CM | POA: Diagnosis not present

## 2012-04-14 DIAGNOSIS — M961 Postlaminectomy syndrome, not elsewhere classified: Secondary | ICD-10-CM | POA: Diagnosis not present

## 2012-05-07 ENCOUNTER — Other Ambulatory Visit: Payer: Self-pay | Admitting: Gastroenterology

## 2012-05-07 DIAGNOSIS — K2270 Barrett's esophagus without dysplasia: Secondary | ICD-10-CM | POA: Diagnosis not present

## 2012-05-07 DIAGNOSIS — K2210 Ulcer of esophagus without bleeding: Secondary | ICD-10-CM | POA: Diagnosis not present

## 2012-05-26 DIAGNOSIS — IMO0002 Reserved for concepts with insufficient information to code with codable children: Secondary | ICD-10-CM | POA: Diagnosis not present

## 2012-05-26 DIAGNOSIS — M961 Postlaminectomy syndrome, not elsewhere classified: Secondary | ICD-10-CM | POA: Diagnosis not present

## 2012-05-26 DIAGNOSIS — M545 Low back pain: Secondary | ICD-10-CM | POA: Diagnosis not present

## 2012-05-31 ENCOUNTER — Emergency Department (HOSPITAL_COMMUNITY)
Admission: EM | Admit: 2012-05-31 | Discharge: 2012-05-31 | Disposition: A | Discharge status: Home / Self Care | Payer: Medicare Other | Attending: Emergency Medicine | Admitting: Emergency Medicine

## 2012-05-31 ENCOUNTER — Encounter (HOSPITAL_COMMUNITY): Payer: Self-pay | Admitting: *Deleted

## 2012-05-31 ENCOUNTER — Emergency Department (HOSPITAL_COMMUNITY): Payer: Medicare Other

## 2012-05-31 DIAGNOSIS — F329 Major depressive disorder, single episode, unspecified: Secondary | ICD-10-CM | POA: Diagnosis not present

## 2012-05-31 DIAGNOSIS — Z79899 Other long term (current) drug therapy: Secondary | ICD-10-CM | POA: Insufficient documentation

## 2012-05-31 DIAGNOSIS — R509 Fever, unspecified: Secondary | ICD-10-CM | POA: Diagnosis not present

## 2012-05-31 DIAGNOSIS — Z9889 Other specified postprocedural states: Secondary | ICD-10-CM | POA: Diagnosis not present

## 2012-05-31 DIAGNOSIS — R110 Nausea: Secondary | ICD-10-CM | POA: Insufficient documentation

## 2012-05-31 DIAGNOSIS — R109 Unspecified abdominal pain: Secondary | ICD-10-CM

## 2012-05-31 DIAGNOSIS — Z87891 Personal history of nicotine dependence: Secondary | ICD-10-CM | POA: Diagnosis not present

## 2012-05-31 DIAGNOSIS — Z8739 Personal history of other diseases of the musculoskeletal system and connective tissue: Secondary | ICD-10-CM | POA: Insufficient documentation

## 2012-05-31 DIAGNOSIS — R161 Splenomegaly, not elsewhere classified: Secondary | ICD-10-CM | POA: Diagnosis not present

## 2012-05-31 DIAGNOSIS — R197 Diarrhea, unspecified: Secondary | ICD-10-CM | POA: Diagnosis not present

## 2012-05-31 DIAGNOSIS — J45909 Unspecified asthma, uncomplicated: Secondary | ICD-10-CM | POA: Insufficient documentation

## 2012-05-31 DIAGNOSIS — K2270 Barrett's esophagus without dysplasia: Secondary | ICD-10-CM | POA: Insufficient documentation

## 2012-05-31 DIAGNOSIS — G4733 Obstructive sleep apnea (adult) (pediatric): Secondary | ICD-10-CM | POA: Diagnosis not present

## 2012-05-31 DIAGNOSIS — F3289 Other specified depressive episodes: Secondary | ICD-10-CM | POA: Insufficient documentation

## 2012-05-31 DIAGNOSIS — R112 Nausea with vomiting, unspecified: Secondary | ICD-10-CM | POA: Diagnosis not present

## 2012-05-31 LAB — URINALYSIS, ROUTINE W REFLEX MICROSCOPIC
Bilirubin Urine: NEGATIVE
Ketones, ur: 40 mg/dL — AB
Leukocytes, UA: NEGATIVE
Nitrite: NEGATIVE
Protein, ur: NEGATIVE mg/dL
Urobilinogen, UA: 0.2 mg/dL (ref 0.0–1.0)

## 2012-05-31 LAB — COMPREHENSIVE METABOLIC PANEL
ALT: 38 U/L (ref 0–53)
Alkaline Phosphatase: 72 U/L (ref 39–117)
BUN: 23 mg/dL (ref 6–23)
CO2: 21 mEq/L (ref 19–32)
Chloride: 106 mEq/L (ref 96–112)
GFR calc Af Amer: >90 mL/min (ref >90)
GFR calc non Af Amer: >90 mL/min (ref >90)
Glucose, Bld: 134 mg/dL — ABNORMAL HIGH (ref 70–99)
Potassium: 3.8 mEq/L (ref 3.5–5.1)
Sodium: 137 mEq/L (ref 135–145)
Total Bilirubin: 0.7 mg/dL (ref 0.3–1.2)
Total Protein: 6.9 g/dL (ref 6.0–8.3)

## 2012-05-31 LAB — CBC
HCT: 41.5 % (ref 39.0–52.0)
Hemoglobin: 15.2 g/dL (ref 13.0–17.0)
MCHC: 36.6 g/dL — ABNORMAL HIGH (ref 30.0–36.0)
RBC: 4.71 MIL/uL (ref 4.22–5.81)

## 2012-05-31 MED ORDER — HYDROCODONE-ACETAMINOPHEN 5-325 MG PO TABS: 1.0000 | ORAL_TABLET | ORAL | Status: DC | PRN

## 2012-05-31 MED ORDER — ONDANSETRON HCL 4 MG PO TABS: 4.0000 mg | ORAL_TABLET | Freq: Three times a day (TID) | ORAL | Status: DC | PRN

## 2012-05-31 MED ORDER — IOHEXOL 300 MG/ML  SOLN
20.0000 mL | INTRAMUSCULAR | Status: AC
  Administered 2012-05-31: 20 mL via ORAL

## 2012-05-31 MED ORDER — SODIUM CHLORIDE 0.9 % IV BOLUS (SEPSIS)
500.0000 mL | Freq: Once | INTRAVENOUS | Status: AC
  Administered 2012-05-31: 500 mL via INTRAVENOUS

## 2012-05-31 MED ORDER — HYDROMORPHONE HCL PF 1 MG/ML IJ SOLN
1.0000 mg | INTRAMUSCULAR | Status: AC
  Administered 2012-05-31: 1 mg via INTRAVENOUS
  Filled 2012-05-31: qty 1

## 2012-05-31 MED ORDER — ONDANSETRON HCL 4 MG/2ML IJ SOLN
4.0000 mg | Freq: Once | INTRAMUSCULAR | Status: AC
  Administered 2012-05-31: 4 mg via INTRAVENOUS
  Filled 2012-05-31: qty 2

## 2012-05-31 MED ORDER — IOHEXOL 300 MG/ML  SOLN
100.0000 mL | Freq: Once | INTRAMUSCULAR | Status: AC | PRN
  Administered 2012-05-31: 100 mL via INTRAVENOUS

## 2012-05-31 MED ORDER — ACETAMINOPHEN 325 MG PO TABS
650.0000 mg | ORAL_TABLET | Freq: Once | ORAL | Status: AC
  Administered 2012-05-31: 650 mg via ORAL
  Filled 2012-05-31: qty 2

## 2012-05-31 MED ORDER — MORPHINE SULFATE 4 MG/ML IJ SOLN
4.0000 mg | Freq: Once | INTRAMUSCULAR | Status: AC
  Administered 2012-05-31: 4 mg via INTRAVENOUS
  Filled 2012-05-31: qty 1

## 2012-05-31 NOTE — ED Notes (Signed)
CT made aware PT done with contrast

## 2012-05-31 NOTE — ED Provider Notes (Signed)
Medical screening examination/treatment/procedure(s) were conducted as a shared visit with non-physician practitioner(s) and myself.  I personally evaluated the patient during the encounter   Rolan Bucco, MD 05/31/12 2251

## 2012-05-31 NOTE — ED Notes (Signed)
Pt stated that he started having diarrhea this morning. No blood in stool. No N/V. Lower abdominal cramping. No chest pain or SOB. Pt stated that this has happened 2 times before, and he is unaware of the reasoning.

## 2012-05-31 NOTE — ED Notes (Signed)
Lab informed of the need to draw blood.

## 2012-05-31 NOTE — ED Notes (Signed)
Pt given oral fluids, pt tolerating well

## 2012-05-31 NOTE — ED Notes (Signed)
Report given to [facility]. Transported by RN.

## 2012-05-31 NOTE — ED Notes (Signed)
Patient states that around 5am he woke up with diarrhea, abdominal pain, fever, and nausea.  Patient states that he just feels dry

## 2012-05-31 NOTE — ED Provider Notes (Signed)
Patient to move to CDU holding for CT abd/pelvis.  Sign out received from Dr Fredderick Phenix.  Pt with RLQ pain, diarrhea, and fever.  Hx hernia repair with mesh, no other abdominal surgeries.  Plan for CT, UA, IVF, pain control.  If CT negative for acute process, pt may be d/c home.    7:55 PM Pt currently in CT.   8:49 PM Patient reports pain across entire lower abdomen, 6/10.  States morphine previously only lasted a short time.  Will give dilaudid and zofran.  Pt reports last episode of diarrhea was just after he arrived to ED after first set of vitals taken.  Awaiting CT results.    9:32 PM Pt reports pain is 3/10 after dilaudid.  On exam, pt is A&O, NAD, abdomen is obese, NABS, soft, TTP diffusely with exception of suprapubic area, no guarding, no rebound.  Reviewed results with Dr Fredderick Phenix.  Plan is for d/c home with pain and nausea medication, recheck with PCP on Monday.    Discussed all results, treatment plan, and follow up with patient.  Pt given return precautions.  Pt verbalizes understanding and agrees with plan.       Results for orders placed during the hospital encounter of 05/31/12  CBC      Component Value Range   WBC 7.4  4.0 - 10.5 K/uL   RBC 4.71  4.22 - 5.81 MIL/uL   Hemoglobin 15.2  13.0 - 17.0 g/dL   HCT 16.1  09.6 - 04.5 %   MCV 88.1  78.0 - 100.0 fL   MCH 32.3  26.0 - 34.0 pg   MCHC 36.6 (*) 30.0 - 36.0 g/dL   RDW 40.9  81.1 - 91.4 %   Platelets 144 (*) 150 - 400 K/uL  COMPREHENSIVE METABOLIC PANEL      Component Value Range   Sodium 137  135 - 145 mEq/L   Potassium 3.8  3.5 - 5.1 mEq/L   Chloride 106  96 - 112 mEq/L   CO2 21  19 - 32 mEq/L   Glucose, Bld 134 (*) 70 - 99 mg/dL   BUN 23  6 - 23 mg/dL   Creatinine, Ser 7.82  0.50 - 1.35 mg/dL   Calcium 9.2  8.4 - 95.6 mg/dL   Total Protein 6.9  6.0 - 8.3 g/dL   Albumin 4.0  3.5 - 5.2 g/dL   AST 24  0 - 37 U/L   ALT 38  0 - 53 U/L   Alkaline Phosphatase 72  39 - 117 U/L   Total Bilirubin 0.7  0.3 - 1.2 mg/dL   GFR  calc non Af Amer >90  >90 mL/min   GFR calc Af Amer >90  >90 mL/min  URINALYSIS, ROUTINE W REFLEX MICROSCOPIC      Component Value Range   Color, Urine YELLOW  YELLOW   APPearance CLEAR  CLEAR   Specific Gravity, Urine 1.026  1.005 - 1.030   pH 8.0  5.0 - 8.0   Glucose, UA NEGATIVE  NEGATIVE mg/dL   Hgb urine dipstick NEGATIVE  NEGATIVE   Bilirubin Urine NEGATIVE  NEGATIVE   Ketones, ur 40 (*) NEGATIVE mg/dL   Protein, ur NEGATIVE  NEGATIVE mg/dL   Urobilinogen, UA 0.2  0.0 - 1.0 mg/dL   Nitrite NEGATIVE  NEGATIVE   Leukocytes, UA NEGATIVE  NEGATIVE   Ct Abdomen Pelvis W Contrast  05/31/2012  *RADIOLOGY REPORT*  Clinical Data: Abdominal pain with diarrhea, fever and nausea.  CT  ABDOMEN AND PELVIS WITH CONTRAST  Technique:  Multidetector CT imaging of the abdomen and pelvis was performed following the standard protocol during bolus administration of intravenous contrast.  Contrast: OMNIPAQUE IOHEXOL 300 MG/ML  SOLN  Comparison: 09/22/2011.  Findings: Fatty liver without focal lesion.  No calcified gallstones.  Enlarged spleen spanning over 17.3 mm.  No focal mass.  No focal pancreatic adrenal or renal mass.  No extraluminal bowel inflammatory process, free fluid or free air.  Prior anterior abdominal wall hernia surgery.  Degenerative changes lower thoracic and lumbar spine.  Prior fusion L4-5.  Noncontrast filled views of the urinary bladder unremarkable.  Mild ectasia iliac arteries.  Minimal atherosclerotic type changes lower abdominal aorta.  Lung bases clear.  IMPRESSION: Splenomegaly.  No extraluminal bowel inflammatory process, free fluid or free air.   Original Report Authenticated By: Lacy Duverney, M.D.       [place], [place] 05/31/12 2158

## 2012-05-31 NOTE — ED Provider Notes (Signed)
History     CSN: [id number]  Arrival date & time 05/31/12  1303   First MD Initiated Contact with Patient 05/31/12 1624      Chief Complaint  Patient presents with  . Diarrhea  . Fever  . Abdominal Pain    (Consider location/radiation/quality/duration/timing/severity/associated sxs/prior treatment) HPI Comments: Patient woke up this morning with abdominal pain. He states it started suddenly about 5 AM. It starts in his lower abdomen and radiates up to the right side. He describes as a burning crampy sensation. He has associated watery, nonbloody diarrhea. Had some nausea but no vomiting. He has had a fever at home. His wife to a rectal temp was 101. He states he feels dehydrated. He denies any recent travel or camping history. He took one dose of clindamycin prior to dental procedure 2 weeks ago however denies any other antibiotic use. He has had multiple hernia repairs in his abdomen but denies any other surgeries. Denies any history of obstruction. Denies any urinary symptoms.  Patient is a 56 y.o. male presenting with diarrhea, fever, and abdominal pain.  Diarrhea The primary symptoms include fever, abdominal pain, nausea and diarrhea. Primary symptoms do not include fatigue, vomiting, arthralgias or rash.  The illness does not include chills or back pain.  Fever Primary symptoms of the febrile illness include fever, abdominal pain, nausea and diarrhea. Primary symptoms do not include fatigue, headaches, cough, shortness of breath, vomiting, arthralgias or rash.  Abdominal Pain The primary symptoms of the illness include abdominal pain, fever, nausea and diarrhea. The primary symptoms of the illness do not include fatigue, shortness of breath or vomiting.  Symptoms associated with the illness do not include chills, diaphoresis, hematuria, frequency or back pain.    Past Medical History  Diagnosis Date  . Arthritis   . Asthma   . Back pain   . Barrett esophagus   . Exposure to  hepatitis B   . Exposure to hepatitis C   . Narcotic abuse   . OSA (obstructive sleep apnea)     on CPAP   . Depressive disorder   . Allergic rhinitis due to pollen     Past Surgical History  Procedure Date  . Back surgery   . Abdominal surgery   . Hernia repair   . Joint replacement   . Total knee arthroplasty 2009    left    Family History  Problem Relation Age of Onset  . Allergies Mother     History  Substance Use Topics  . Smoking status: Former Smoker -- 0.8 packs/day for 15 years    Types: Cigarettes    Quit date: 07/16/2004  . Smokeless tobacco: Never Used  . Alcohol Use: Yes     Comment: occ      Review of Systems  Constitutional: Positive for fever. Negative for chills, diaphoresis and fatigue.  HENT: Negative for congestion, rhinorrhea and sneezing.   Eyes: Negative.   Respiratory: Negative for cough, chest tightness and shortness of breath.   Cardiovascular: Negative for chest pain and leg swelling.  Gastrointestinal: Positive for nausea, abdominal pain and diarrhea. Negative for vomiting and blood in stool.  Genitourinary: Negative for frequency, hematuria, flank pain and difficulty urinating.  Musculoskeletal: Negative for back pain and arthralgias.  Skin: Negative for rash.  Neurological: Negative for dizziness, speech difficulty, weakness, numbness and headaches.    Allergies  Review of patient's allergies indicates no known allergies.  Home Medications   Current Outpatient Rx  Name  Route  Sig  Dispense  Refill  . ALBUTEROL SULFATE HFA 108 (90 BASE) MCG/ACT IN AERS   Inhalation   Inhale 2 puffs into the lungs every 6 (six) hours as needed. For shortness of breath         . B COMPLEX 100 PO   Oral   Take 1 capsule by mouth daily.         . MELOXICAM 7.5 MG PO TABS   Oral   Take 7.5 mg by mouth 2 (two) times daily.         Marland Kitchen OMEPRAZOLE 20 MG PO CPDR   Oral   Take 20 mg by mouth 2 (two) times daily. Take 30- 60 min before your  first and last meals of the day         . POTASSIUM 99 MG PO TABS   Oral   Take 2 tablets by mouth every other day.          . SERTRALINE HCL 100 MG PO TABS   Oral   Take 200 mg by mouth daily.         . TOPIRAMATE 25 MG PO TABS   Oral   Take 25 mg by mouth 2 (two) times daily. Tapering up to 2 tablets twice daily         . TRAMADOL HCL 50 MG PO TABS   Oral   Take 50 mg by mouth every 4 (four) hours as needed. For pain           BP 135/80  Pulse 103  Temp 99.1 F (37.3 C) (Oral)  Resp 20  SpO2 95%  Physical Exam  Constitutional: He is oriented to person, place, and time. He appears well-developed and well-nourished.  HENT:  Head: Normocephalic and atraumatic.  Eyes: Pupils are equal, round, and reactive to light.  Neck: Normal range of motion. Neck supple.  Cardiovascular: Normal rate, regular rhythm and normal heart sounds.   Pulmonary/Chest: Effort normal and breath sounds normal. No respiratory distress. He has no wheezes. He has no rales. He exhibits no tenderness.  Abdominal: Soft. Bowel sounds are normal. There is tenderness (Moderate tenderness to the right lower and mid abdomen. No peritoneal signs.). There is no rebound and no guarding.  Musculoskeletal: Normal range of motion. He exhibits no edema.  Lymphadenopathy:    He has no cervical adenopathy.  Neurological: He is alert and oriented to person, place, and time.  Skin: Skin is warm and dry. No rash noted.  Psychiatric: He has a normal mood and affect.    ED Course  Procedures (including critical care time)  Results for orders placed during the hospital encounter of 05/31/12  CBC      Component Value Range   WBC 7.4  4.0 - 10.5 K/uL   RBC 4.71  4.22 - 5.81 MIL/uL   Hemoglobin 15.2  13.0 - 17.0 g/dL   HCT 40.1  02.7 - 25.3 %   MCV 88.1  78.0 - 100.0 fL   MCH 32.3  26.0 - 34.0 pg   MCHC 36.6 (*) 30.0 - 36.0 g/dL   RDW 66.4  40.3 - 47.4 %   Platelets 144 (*) 150 - 400 K/uL  COMPREHENSIVE  METABOLIC PANEL      Component Value Range   Sodium 137  135 - 145 mEq/L   Potassium 3.8  3.5 - 5.1 mEq/L   Chloride 106  96 - 112 mEq/L   CO2 21  19 -  32 mEq/L   Glucose, Bld 134 (*) 70 - 99 mg/dL   BUN 23  6 - 23 mg/dL   Creatinine, Ser 3.32  0.50 - 1.35 mg/dL   Calcium 9.2  8.4 - 95.1 mg/dL   Total Protein 6.9  6.0 - 8.3 g/dL   Albumin 4.0  3.5 - 5.2 g/dL   AST 24  0 - 37 U/L   ALT 38  0 - 53 U/L   Alkaline Phosphatase 72  39 - 117 U/L   Total Bilirubin 0.7  0.3 - 1.2 mg/dL   GFR calc non Af Amer >90  >90 mL/min   GFR calc Af Amer >90  >90 mL/min   No results found.    1. Abdominal pain       MDM  Pt with lower abd pain, diarrhea.  Labs okay.  Awaiting urine and CT.  Will move to CDU pending results.         Rolan Bucco, MD 05/31/12 [phone]

## 2012-06-03 DIAGNOSIS — Z23 Encounter for immunization: Secondary | ICD-10-CM | POA: Diagnosis not present

## 2012-06-03 DIAGNOSIS — R109 Unspecified abdominal pain: Secondary | ICD-10-CM | POA: Diagnosis not present

## 2012-06-03 DIAGNOSIS — R197 Diarrhea, unspecified: Secondary | ICD-10-CM | POA: Diagnosis not present

## 2012-06-06 DIAGNOSIS — G4733 Obstructive sleep apnea (adult) (pediatric): Secondary | ICD-10-CM | POA: Diagnosis not present

## 2012-06-17 DIAGNOSIS — M545 Low back pain: Secondary | ICD-10-CM | POA: Diagnosis not present

## 2012-06-17 DIAGNOSIS — E669 Obesity, unspecified: Secondary | ICD-10-CM | POA: Diagnosis not present

## 2012-06-17 DIAGNOSIS — Z981 Arthrodesis status: Secondary | ICD-10-CM | POA: Diagnosis not present

## 2012-06-17 DIAGNOSIS — M129 Arthropathy, unspecified: Secondary | ICD-10-CM | POA: Diagnosis not present

## 2012-06-17 DIAGNOSIS — B1920 Unspecified viral hepatitis C without hepatic coma: Secondary | ICD-10-CM | POA: Diagnosis not present

## 2012-06-17 DIAGNOSIS — M961 Postlaminectomy syndrome, not elsewhere classified: Secondary | ICD-10-CM | POA: Diagnosis not present

## 2012-06-17 DIAGNOSIS — G4730 Sleep apnea, unspecified: Secondary | ICD-10-CM | POA: Diagnosis not present

## 2012-06-17 DIAGNOSIS — IMO0002 Reserved for concepts with insufficient information to code with codable children: Secondary | ICD-10-CM | POA: Diagnosis not present

## 2012-06-17 DIAGNOSIS — G8921 Chronic pain due to trauma: Secondary | ICD-10-CM | POA: Diagnosis not present

## 2012-06-17 DIAGNOSIS — Z79899 Other long term (current) drug therapy: Secondary | ICD-10-CM | POA: Diagnosis not present

## 2012-06-17 DIAGNOSIS — Z96659 Presence of unspecified artificial knee joint: Secondary | ICD-10-CM | POA: Diagnosis not present

## 2012-06-17 DIAGNOSIS — F329 Major depressive disorder, single episode, unspecified: Secondary | ICD-10-CM | POA: Diagnosis not present

## 2012-06-25 DIAGNOSIS — H905 Unspecified sensorineural hearing loss: Secondary | ICD-10-CM | POA: Diagnosis not present

## 2012-07-01 ENCOUNTER — Other Ambulatory Visit: Payer: Self-pay | Admitting: Gastroenterology

## 2012-07-01 DIAGNOSIS — K2270 Barrett's esophagus without dysplasia: Secondary | ICD-10-CM | POA: Diagnosis not present

## 2012-07-01 DIAGNOSIS — K2210 Ulcer of esophagus without bleeding: Secondary | ICD-10-CM | POA: Diagnosis not present

## 2012-07-01 DIAGNOSIS — R1011 Right upper quadrant pain: Secondary | ICD-10-CM

## 2012-07-03 ENCOUNTER — Ambulatory Visit
Admission: RE | Admit: 2012-07-03 | Discharge: 2012-07-03 | Disposition: A | Discharge status: Home / Self Care | Payer: Medicare Other | Source: Ambulatory Visit | Attending: Gastroenterology | Admitting: Gastroenterology

## 2012-07-03 DIAGNOSIS — R1011 Right upper quadrant pain: Secondary | ICD-10-CM

## 2012-07-03 DIAGNOSIS — R161 Splenomegaly, not elsewhere classified: Secondary | ICD-10-CM | POA: Diagnosis not present

## 2012-07-03 DIAGNOSIS — K7689 Other specified diseases of liver: Secondary | ICD-10-CM | POA: Diagnosis not present

## 2012-07-21 DIAGNOSIS — M961 Postlaminectomy syndrome, not elsewhere classified: Secondary | ICD-10-CM | POA: Diagnosis not present

## 2012-07-21 DIAGNOSIS — IMO0002 Reserved for concepts with insufficient information to code with codable children: Secondary | ICD-10-CM | POA: Diagnosis not present

## 2012-07-21 DIAGNOSIS — G8921 Chronic pain due to trauma: Secondary | ICD-10-CM | POA: Insufficient documentation

## 2012-07-21 DIAGNOSIS — M5416 Radiculopathy, lumbar region: Secondary | ICD-10-CM | POA: Insufficient documentation

## 2012-11-10 ENCOUNTER — Other Ambulatory Visit: Payer: Self-pay | Admitting: Gastroenterology

## 2012-11-10 DIAGNOSIS — K2270 Barrett's esophagus without dysplasia: Secondary | ICD-10-CM | POA: Diagnosis not present

## 2013-01-13 DIAGNOSIS — M545 Low back pain: Secondary | ICD-10-CM | POA: Diagnosis not present

## 2013-01-13 DIAGNOSIS — M542 Cervicalgia: Secondary | ICD-10-CM | POA: Diagnosis not present

## 2013-01-19 DIAGNOSIS — M47812 Spondylosis without myelopathy or radiculopathy, cervical region: Secondary | ICD-10-CM | POA: Diagnosis not present

## 2013-01-19 DIAGNOSIS — R209 Unspecified disturbances of skin sensation: Secondary | ICD-10-CM | POA: Diagnosis not present

## 2013-01-20 DIAGNOSIS — IMO0002 Reserved for concepts with insufficient information to code with codable children: Secondary | ICD-10-CM | POA: Diagnosis not present

## 2013-02-17 DIAGNOSIS — Z79899 Other long term (current) drug therapy: Secondary | ICD-10-CM | POA: Diagnosis not present

## 2013-02-17 DIAGNOSIS — M961 Postlaminectomy syndrome, not elsewhere classified: Secondary | ICD-10-CM | POA: Diagnosis not present

## 2013-02-17 DIAGNOSIS — M79609 Pain in unspecified limb: Secondary | ICD-10-CM | POA: Diagnosis not present

## 2013-02-17 DIAGNOSIS — G894 Chronic pain syndrome: Secondary | ICD-10-CM | POA: Diagnosis not present

## 2013-03-05 DIAGNOSIS — M961 Postlaminectomy syndrome, not elsewhere classified: Secondary | ICD-10-CM | POA: Diagnosis not present

## 2013-03-05 DIAGNOSIS — M79609 Pain in unspecified limb: Secondary | ICD-10-CM | POA: Diagnosis not present

## 2013-03-05 DIAGNOSIS — G894 Chronic pain syndrome: Secondary | ICD-10-CM | POA: Diagnosis not present

## 2013-03-09 ENCOUNTER — Other Ambulatory Visit: Payer: Self-pay | Admitting: Family Medicine

## 2013-03-09 ENCOUNTER — Ambulatory Visit
Admission: RE | Admit: 2013-03-09 | Discharge: 2013-03-09 | Disposition: A | Discharge status: Home / Self Care | Payer: Medicare Other | Source: Ambulatory Visit | Attending: Family Medicine | Admitting: Family Medicine

## 2013-03-09 DIAGNOSIS — M19049 Primary osteoarthritis, unspecified hand: Secondary | ICD-10-CM | POA: Diagnosis not present

## 2013-03-09 DIAGNOSIS — M79645 Pain in left finger(s): Secondary | ICD-10-CM

## 2013-03-09 DIAGNOSIS — M79609 Pain in unspecified limb: Secondary | ICD-10-CM | POA: Diagnosis not present

## 2013-03-13 DIAGNOSIS — R6882 Decreased libido: Secondary | ICD-10-CM | POA: Diagnosis not present

## 2013-03-13 DIAGNOSIS — R609 Edema, unspecified: Secondary | ICD-10-CM | POA: Diagnosis not present

## 2013-03-13 DIAGNOSIS — M542 Cervicalgia: Secondary | ICD-10-CM | POA: Diagnosis not present

## 2013-03-13 DIAGNOSIS — R635 Abnormal weight gain: Secondary | ICD-10-CM | POA: Diagnosis not present

## 2013-03-13 DIAGNOSIS — M1990 Unspecified osteoarthritis, unspecified site: Secondary | ICD-10-CM | POA: Diagnosis not present

## 2013-04-29 DIAGNOSIS — G894 Chronic pain syndrome: Secondary | ICD-10-CM | POA: Diagnosis not present

## 2013-04-29 DIAGNOSIS — M961 Postlaminectomy syndrome, not elsewhere classified: Secondary | ICD-10-CM | POA: Diagnosis not present

## 2013-04-29 DIAGNOSIS — Z79899 Other long term (current) drug therapy: Secondary | ICD-10-CM | POA: Diagnosis not present

## 2013-04-29 DIAGNOSIS — M5137 Other intervertebral disc degeneration, lumbosacral region: Secondary | ICD-10-CM | POA: Diagnosis not present

## 2013-06-05 DIAGNOSIS — G4733 Obstructive sleep apnea (adult) (pediatric): Secondary | ICD-10-CM | POA: Diagnosis not present

## 2013-07-22 DIAGNOSIS — G894 Chronic pain syndrome: Secondary | ICD-10-CM | POA: Diagnosis not present

## 2013-07-22 DIAGNOSIS — M961 Postlaminectomy syndrome, not elsewhere classified: Secondary | ICD-10-CM | POA: Diagnosis not present

## 2013-07-22 DIAGNOSIS — M5380 Other specified dorsopathies, site unspecified: Secondary | ICD-10-CM | POA: Diagnosis not present

## 2013-07-22 DIAGNOSIS — Z79899 Other long term (current) drug therapy: Secondary | ICD-10-CM | POA: Diagnosis not present

## 2014-01-04 DIAGNOSIS — M5137 Other intervertebral disc degeneration, lumbosacral region: Secondary | ICD-10-CM | POA: Diagnosis not present

## 2014-01-04 DIAGNOSIS — G894 Chronic pain syndrome: Secondary | ICD-10-CM | POA: Diagnosis not present

## 2014-01-04 DIAGNOSIS — M79609 Pain in unspecified limb: Secondary | ICD-10-CM | POA: Diagnosis not present

## 2014-01-04 DIAGNOSIS — Z79899 Other long term (current) drug therapy: Secondary | ICD-10-CM | POA: Diagnosis not present

## 2014-04-15 DEATH — deceased

## 2014-06-30 DIAGNOSIS — Z79899 Other long term (current) drug therapy: Secondary | ICD-10-CM | POA: Diagnosis not present

## 2014-06-30 DIAGNOSIS — G894 Chronic pain syndrome: Secondary | ICD-10-CM | POA: Diagnosis not present

## 2014-06-30 DIAGNOSIS — M79604 Pain in right leg: Secondary | ICD-10-CM | POA: Diagnosis not present

## 2014-06-30 DIAGNOSIS — Z79891 Long term (current) use of opiate analgesic: Secondary | ICD-10-CM | POA: Diagnosis not present

## 2014-06-30 DIAGNOSIS — M1990 Unspecified osteoarthritis, unspecified site: Secondary | ICD-10-CM | POA: Diagnosis not present

## 2014-06-30 DIAGNOSIS — M79605 Pain in left leg: Secondary | ICD-10-CM | POA: Diagnosis not present

## 2014-06-30 DIAGNOSIS — M549 Dorsalgia, unspecified: Secondary | ICD-10-CM | POA: Diagnosis not present

## 2014-06-30 DIAGNOSIS — G629 Polyneuropathy, unspecified: Secondary | ICD-10-CM | POA: Diagnosis not present

## 2014-06-30 DIAGNOSIS — M961 Postlaminectomy syndrome, not elsewhere classified: Secondary | ICD-10-CM | POA: Diagnosis not present

## 2014-12-24 DIAGNOSIS — W57XXXA Bitten or stung by nonvenomous insect and other nonvenomous arthropods, initial encounter: Secondary | ICD-10-CM | POA: Diagnosis not present

## 2014-12-24 DIAGNOSIS — Z6841 Body Mass Index (BMI) 40.0 and over, adult: Secondary | ICD-10-CM | POA: Diagnosis not present

## 2014-12-24 DIAGNOSIS — I700 Atherosclerosis of aorta: Secondary | ICD-10-CM | POA: Diagnosis not present

## 2014-12-24 DIAGNOSIS — R21 Rash and other nonspecific skin eruption: Secondary | ICD-10-CM | POA: Diagnosis not present

## 2014-12-27 DIAGNOSIS — M1990 Unspecified osteoarthritis, unspecified site: Secondary | ICD-10-CM | POA: Diagnosis not present

## 2014-12-27 DIAGNOSIS — M961 Postlaminectomy syndrome, not elsewhere classified: Secondary | ICD-10-CM | POA: Diagnosis not present

## 2014-12-27 DIAGNOSIS — G629 Polyneuropathy, unspecified: Secondary | ICD-10-CM | POA: Diagnosis not present

## 2014-12-27 DIAGNOSIS — Z79899 Other long term (current) drug therapy: Secondary | ICD-10-CM | POA: Diagnosis not present

## 2014-12-27 DIAGNOSIS — M79606 Pain in leg, unspecified: Secondary | ICD-10-CM | POA: Diagnosis not present

## 2014-12-27 DIAGNOSIS — G894 Chronic pain syndrome: Secondary | ICD-10-CM | POA: Diagnosis not present

## 2015-06-27 DIAGNOSIS — M792 Neuralgia and neuritis, unspecified: Secondary | ICD-10-CM | POA: Diagnosis not present

## 2015-06-27 DIAGNOSIS — Z79899 Other long term (current) drug therapy: Secondary | ICD-10-CM | POA: Diagnosis not present

## 2015-06-27 DIAGNOSIS — M4697 Unspecified inflammatory spondylopathy, lumbosacral region: Secondary | ICD-10-CM | POA: Diagnosis not present

## 2015-06-27 DIAGNOSIS — G894 Chronic pain syndrome: Secondary | ICD-10-CM | POA: Diagnosis not present

## 2015-09-19 DIAGNOSIS — Z79899 Other long term (current) drug therapy: Secondary | ICD-10-CM | POA: Diagnosis not present

## 2015-09-19 DIAGNOSIS — M4697 Unspecified inflammatory spondylopathy, lumbosacral region: Secondary | ICD-10-CM | POA: Diagnosis not present

## 2015-09-19 DIAGNOSIS — M545 Low back pain: Secondary | ICD-10-CM | POA: Diagnosis not present

## 2015-09-19 DIAGNOSIS — G894 Chronic pain syndrome: Secondary | ICD-10-CM | POA: Diagnosis not present

## 2015-09-27 ENCOUNTER — Emergency Department (HOSPITAL_COMMUNITY)
Admission: EM | Admit: 2015-09-27 | Discharge: 2015-09-27 | Disposition: A | Discharge status: Home / Self Care | Payer: No Typology Code available for payment source | Attending: Emergency Medicine | Admitting: Emergency Medicine

## 2015-09-27 ENCOUNTER — Encounter (HOSPITAL_COMMUNITY): Payer: Self-pay | Admitting: Emergency Medicine

## 2015-09-27 ENCOUNTER — Emergency Department (HOSPITAL_COMMUNITY): Payer: No Typology Code available for payment source

## 2015-09-27 DIAGNOSIS — Y9389 Activity, other specified: Secondary | ICD-10-CM | POA: Insufficient documentation

## 2015-09-27 DIAGNOSIS — Y998 Other external cause status: Secondary | ICD-10-CM | POA: Insufficient documentation

## 2015-09-27 DIAGNOSIS — S29001A Unspecified injury of muscle and tendon of front wall of thorax, initial encounter: Secondary | ICD-10-CM | POA: Insufficient documentation

## 2015-09-27 DIAGNOSIS — J45909 Unspecified asthma, uncomplicated: Secondary | ICD-10-CM | POA: Insufficient documentation

## 2015-09-27 DIAGNOSIS — S199XXA Unspecified injury of neck, initial encounter: Secondary | ICD-10-CM | POA: Diagnosis not present

## 2015-09-27 DIAGNOSIS — Z9981 Dependence on supplemental oxygen: Secondary | ICD-10-CM | POA: Diagnosis not present

## 2015-09-27 DIAGNOSIS — S279XXA Injury of unspecified intrathoracic organ, initial encounter: Secondary | ICD-10-CM | POA: Diagnosis not present

## 2015-09-27 DIAGNOSIS — R079 Chest pain, unspecified: Secondary | ICD-10-CM | POA: Diagnosis not present

## 2015-09-27 DIAGNOSIS — Y92410 Unspecified street and highway as the place of occurrence of the external cause: Secondary | ICD-10-CM | POA: Insufficient documentation

## 2015-09-27 DIAGNOSIS — S161XXA Strain of muscle, fascia and tendon at neck level, initial encounter: Secondary | ICD-10-CM | POA: Diagnosis not present

## 2015-09-27 DIAGNOSIS — Z87891 Personal history of nicotine dependence: Secondary | ICD-10-CM | POA: Diagnosis not present

## 2015-09-27 DIAGNOSIS — G8929 Other chronic pain: Secondary | ICD-10-CM | POA: Insufficient documentation

## 2015-09-27 DIAGNOSIS — S299XXA Unspecified injury of thorax, initial encounter: Secondary | ICD-10-CM | POA: Diagnosis not present

## 2015-09-27 DIAGNOSIS — G4733 Obstructive sleep apnea (adult) (pediatric): Secondary | ICD-10-CM | POA: Diagnosis not present

## 2015-09-27 DIAGNOSIS — S0990XA Unspecified injury of head, initial encounter: Secondary | ICD-10-CM | POA: Insufficient documentation

## 2015-09-27 DIAGNOSIS — S3992XA Unspecified injury of lower back, initial encounter: Secondary | ICD-10-CM | POA: Diagnosis not present

## 2015-09-27 DIAGNOSIS — Z8619 Personal history of other infectious and parasitic diseases: Secondary | ICD-10-CM | POA: Diagnosis not present

## 2015-09-27 MED ORDER — HYDROCODONE-ACETAMINOPHEN 5-325 MG PO TABS
2.0000 | ORAL_TABLET | Freq: Once | ORAL | Status: AC
  Administered 2015-09-27: 2 via ORAL
  Filled 2015-09-27: qty 2

## 2015-09-27 MED ORDER — TRAMADOL HCL 50 MG PO TABS
150.0000 mg | ORAL_TABLET | Freq: Once | ORAL | Status: DC
  Filled 2015-09-27: qty 3
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
## 2015-09-30 ENCOUNTER — Ambulatory Visit
Admission: RE | Admit: 2015-09-30 | Discharge: 2015-09-30 | Disposition: A | Discharge status: Home / Self Care | Payer: Medicare Other | Source: Ambulatory Visit | Attending: Family Medicine | Admitting: Family Medicine

## 2015-09-30 ENCOUNTER — Other Ambulatory Visit: Payer: Self-pay | Admitting: Family Medicine

## 2015-09-30 DIAGNOSIS — M5136 Other intervertebral disc degeneration, lumbar region: Secondary | ICD-10-CM | POA: Diagnosis not present

## 2015-09-30 DIAGNOSIS — M549 Dorsalgia, unspecified: Secondary | ICD-10-CM | POA: Diagnosis not present

## 2015-09-30 DIAGNOSIS — R29898 Other symptoms and signs involving the musculoskeletal system: Secondary | ICD-10-CM

## 2015-10-04 DIAGNOSIS — Z981 Arthrodesis status: Secondary | ICD-10-CM | POA: Diagnosis not present

## 2015-10-04 DIAGNOSIS — S335XXA Sprain of ligaments of lumbar spine, initial encounter: Secondary | ICD-10-CM | POA: Diagnosis not present

## 2015-10-04 DIAGNOSIS — M545 Low back pain: Secondary | ICD-10-CM | POA: Diagnosis not present

## 2015-10-27 ENCOUNTER — Encounter: Payer: Self-pay | Admitting: Physical Therapy

## 2015-10-27 ENCOUNTER — Ambulatory Visit: Payer: Medicare Other | Attending: Orthopedic Surgery | Admitting: Physical Therapy

## 2015-10-27 DIAGNOSIS — M5442 Lumbago with sciatica, left side: Secondary | ICD-10-CM | POA: Diagnosis not present

## 2015-10-27 DIAGNOSIS — M6281 Muscle weakness (generalized): Secondary | ICD-10-CM | POA: Diagnosis not present

## 2015-10-27 DIAGNOSIS — R262 Difficulty in walking, not elsewhere classified: Secondary | ICD-10-CM | POA: Diagnosis not present
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
## 2015-11-03 DIAGNOSIS — Z981 Arthrodesis status: Secondary | ICD-10-CM | POA: Diagnosis not present

## 2015-11-03 DIAGNOSIS — S335XXA Sprain of ligaments of lumbar spine, initial encounter: Secondary | ICD-10-CM | POA: Diagnosis not present

## 2015-11-03 DIAGNOSIS — M545 Low back pain: Secondary | ICD-10-CM | POA: Diagnosis not present

## 2015-11-10 DIAGNOSIS — Q7649 Other congenital malformations of spine, not associated with scoliosis: Secondary | ICD-10-CM | POA: Diagnosis not present

## 2015-11-10 DIAGNOSIS — Z981 Arthrodesis status: Secondary | ICD-10-CM | POA: Diagnosis not present

## 2015-11-10 DIAGNOSIS — M5124 Other intervertebral disc displacement, thoracic region: Secondary | ICD-10-CM | POA: Diagnosis not present

## 2015-11-10 DIAGNOSIS — M5126 Other intervertebral disc displacement, lumbar region: Secondary | ICD-10-CM | POA: Diagnosis not present

## 2015-11-14 ENCOUNTER — Ambulatory Visit: Payer: Medicare Other | Attending: Orthopedic Surgery | Admitting: Physical Therapy

## 2015-11-14 DIAGNOSIS — M5442 Lumbago with sciatica, left side: Secondary | ICD-10-CM | POA: Diagnosis not present

## 2015-11-14 DIAGNOSIS — M6281 Muscle weakness (generalized): Secondary | ICD-10-CM | POA: Insufficient documentation

## 2015-11-14 DIAGNOSIS — R262 Difficulty in walking, not elsewhere classified: Secondary | ICD-10-CM | POA: Insufficient documentation

## 2015-11-14 NOTE — Therapy (Signed)
[facility] [address] [place], [place], [address] Phone: [phone]   Fax:  [phone]  Physical Therapy Treatment  Patient Details  Name: Robert [name] MRN: [id number] Date of Birth: Nov 25, 1955 Referring Provider: Dr Herbert Deaner Daubert   Encounter Date: 11/14/2015      PT End of Session - 11/14/15 0931    Visit Number 2   Number of Visits 16   Date for PT Re-Evaluation 12/22/15   Authorization Type medicare    PT Start Time 0930   PT Stop Time 1020   PT Time Calculation (min) 50 min      Past Medical History  Diagnosis Date  . Arthritis   . Asthma   . Back pain   . Barrett esophagus   . Exposure to hepatitis B   . Exposure to hepatitis C   . Narcotic abuse   . OSA (obstructive sleep apnea)     on CPAP   . Depressive disorder   . Allergic rhinitis due to pollen     Past Surgical History  Procedure Laterality Date  . Back surgery    . Abdominal surgery    . Hernia repair    . Joint replacement    . Total knee arthroplasty  2009    left    There were no vitals filed for this visit.      Subjective Assessment - 11/14/15 0931    Subjective I am better.  I talked to the PA. They cleared me for PT. Gave me muscle relaxers and recommended heat and ice.    Currently in Pain? Yes   Pain Score 6    Pain Location Back   Pain Descriptors / Indicators Aching;Stabbing   Aggravating Factors  lifting and twisting to move dog   Pain Relieving Factors heat, muscle relaxers,                          [facility] Adult PT Treatment/Exercise - 11/14/15 0001    Self-Care   Self-Care Other Self-Care Comments   Other Self-Care Comments  HOME TENS RECOMMENDATION   Lumbar Exercises: Stretches   Single Knee to Chest Stretch 3 reps;30 seconds   Lower Trunk Rotation 5 reps;10 seconds   Lumbar Exercises: Supine   Ab Set 10 reps   AB Set Limitations with cues for breathing   Clam 10 reps   Clam Limitations with  ab set   Isometric Hip Flexion 10 reps   Electrical Stimulation   Electrical Stimulation Location lumbar spine side lying on the R with pillow between the legs.    Electrical Stimulation Action IFCx 15 minutes   Electrical Stimulation Parameters 27 mA   Electrical Stimulation Goals Pain                PT Education - 11/14/15 1033    Education provided Yes   Education Details Tens unit info, HEP    Person(s) Educated Patient   Methods Explanation;Handout   Comprehension Verbalized understanding          PT Short Term Goals - 11/14/15 1046    PT SHORT TERM GOAL #1   Title -Pt will increase core strength to good    Time 4   Period Weeks   Status On-going   PT SHORT TERM GOAL #2   Title -Pt will be I w/ HEP for core strength and stretching    Time 4   Period Weeks  Status On-going   PT SHORT TERM GOAL #3   Title -Pt will report centralized lumbar pain with no radiculopathy   Time 4   Period Weeks   Status On-going   PT SHORT TERM GOAL #4   Title Pt will report 4/10 pain at worst.    Time 4   Period Weeks   Status On-going           PT Long Term Goals - 10/27/15 1216    PT LONG TERM GOAL #1   Title Pt will sit for 1 hour without pain in order to drive in his car    Baseline can not sit for more then 5 min    PT LONG TERM GOAL #2   Title Pt will stand for 1/2 hour in order to perform IADL's    Baseline can not stand > 5 min    PT LONG TERM GOAL #3   Title Pt's photo score will have a 55 % limitation    Baseline 80 % limitation    PT LONG TERM GOAL #4   Title Pt return to the gym with a program that promotes core stability and avoids exercises that will irritate his back.                Plan - 11/14/15 1034    Clinical Impression Statement Pt reports improvement after muscle relaxers and Heat treatments at home. PA cleared him for PT however he has not gotten the results of his MRI. Instructed pt in gentle hip stretching as well as beginning  core exercises and updated HEP with no increased pain. Pt also given info on how to purchase Home TENS.  Pt reports he has mutiple umbilical and inguinal hernias. He has a h/o umbilical hernia repair.    PT Next Visit Plan check HEP, continue IFC   PT Home Exercise Plan LTR, knee to chest, isometric hip flexion with ab set, ab set with clam      Patient will benefit from skilled therapeutic intervention in order to improve the following deficits and impairments:  Abnormal gait, Decreased activity tolerance, Decreased range of motion, Decreased endurance, Hypomobility, Pain, Impaired sensation, Improper body mechanics  Visit Diagnosis: Bilateral low back pain with left-sided sciatica  Muscle weakness (generalized)  Difficulty in walking, not elsewhere classified     Problem List Patient Active Problem List   Diagnosis Date Noted  . Morbid obesity (HCC) 02/15/2012  . Back pain 01/04/2012  . Postlaminectomy syndrome, lumbar region 01/04/2012  . Opioid abuse, unspecified 01/04/2012  . Dyspnea 11/27/2011  . Bronchitis 09/22/2011  . Confusion 09/22/2011  . Abdominal pain 09/22/2011  . Constipation 09/22/2011  . Chronic back pain 09/22/2011  . Dehydration 09/22/2011  . Nausea & vomiting 09/22/2011    Sherrie Mustacheonoho, Tarique Loveall McGee, PTA 11/14/2015, 10:48 AM  [facility] [address] [place], [place], [address] Phone: [phone]   Fax:  [phone]  Name: Robert [name] MRN: [id number] Date of Birth: 10/22/1955

## 2015-11-14 NOTE — Patient Instructions (Addendum)
Unilateral Isometric Hip Flexion    Tighten stomach and raise right knee to outstretched arm. Push gently, keeping arm straight, trunk rigid. Hold ___5_ seconds while exhaling Repeat __10__ times per set. Do __1-2__ sets per session. Do __2__ sessions per day.  Knee Drop    Keep pelvis stable. Inhale, Without rotating hips, slowly drop knees to side, pause, return to center as you exhale.  Repeat for ___10_ times. 2 session per day.  Knee to Chest (Flexion)    Pull knee toward chest. Feel stretch in lower back or buttock area. Breathing deeply, Hold _30___ seconds. Repeat with other knee. Repeat _3___ times. Do ___2_ sessions per day.  Lower Trunk Rotation Stretch    Keeping back flat and feet together, rotate knees to left side. Hold _5___ seconds. Repeat _10___ times per set. Do __1__ sets per session. Do __2__ sessions per day.

## 2015-11-16 ENCOUNTER — Ambulatory Visit: Payer: Medicare Other | Admitting: Physical Therapy

## 2015-11-16 DIAGNOSIS — R262 Difficulty in walking, not elsewhere classified: Secondary | ICD-10-CM

## 2015-11-16 DIAGNOSIS — M5442 Lumbago with sciatica, left side: Secondary | ICD-10-CM | POA: Diagnosis not present

## 2015-11-16 DIAGNOSIS — M6281 Muscle weakness (generalized): Secondary | ICD-10-CM

## 2015-11-16 NOTE — Therapy (Signed)
[facility] [address] [place], [place], [address] Phone: [phone]   Fax:  [phone]  Physical Therapy Treatment  Patient Details  Name: Robert [name] MRN: [id number] Date of Birth: [birth year] Referring Provider: Dr Herbert DeanerHarlan Daubert   Encounter Date: 11/16/2015      PT End of Session - 11/16/15 1005    Visit Number 3   Number of Visits 16   Date for PT Re-Evaluation 12/22/15   Authorization Type medicare    PT Start Time 0932   PT Stop Time 1019   PT Time Calculation (min) 47 min      Past Medical History  Diagnosis Date  . Arthritis   . Asthma   . Back pain   . Barrett esophagus   . Exposure to hepatitis B   . Exposure to hepatitis C   . Narcotic abuse   . OSA (obstructive sleep apnea)     on CPAP   . Depressive disorder   . Allergic rhinitis due to pollen     Past Surgical History  Procedure Laterality Date  . Back surgery    . Abdominal surgery    . Hernia repair    . Joint replacement    . Total knee arthroplasty  2009    left    There were no vitals filed for this visit.      Subjective Assessment - 11/16/15 0933    Subjective The exercises make my leg muscles sore.    Currently in Pain? Yes   Pain Score 6   5.5 or 6/10                          [facility] Adult PT Treatment/Exercise - 11/16/15 0001    Lumbar Exercises: Stretches   Single Knee to Chest Stretch 3 reps;30 seconds   Lower Trunk Rotation 5 reps;10 seconds   Lumbar Exercises: Supine   Ab Set 10 reps   AB Set Limitations with cues for breathing   Glut Set 10 reps   Glut Set Limitations with ab set   Clam 10 reps   Clam Limitations with ab set   Isometric Hip Flexion 10 reps   Isometric Hip Flexion Limitations added opposites for obliques   Electrical Stimulation   Electrical Stimulation Location lumbar spine side lying on the R with pillow between the legs.    Electrical Stimulation Action IFC x 15 minutes    Electrical Stimulation Parameters 29   Electrical Stimulation Goals Pain                  PT Short Term Goals - 11/14/15 1046    PT SHORT TERM GOAL #1   Title -Pt will increase core strength to good    Time 4   Period Weeks   Status On-going   PT SHORT TERM GOAL #2   Title -Pt will be I w/ HEP for core strength and stretching    Time 4   Period Weeks   Status On-going   PT SHORT TERM GOAL #3   Title -Pt will report centralized lumbar pain with no radiculopathy   Time 4   Period Weeks   Status On-going   PT SHORT TERM GOAL #4   Title Pt will report 4/10 pain at worst.    Time 4   Period Weeks   Status On-going           PT Long Term Goals -  10/27/15 1216    PT LONG TERM GOAL #1   Title Pt will sit for 1 hour without pain in order to drive in his car    Baseline can not sit for more then 5 min    PT LONG TERM GOAL #2   Title Pt will stand for 1/2 hour in order to perform IADL's    Baseline can not stand > 5 min    PT LONG TERM GOAL #3   Title Pt's photo score will have a 55 % limitation    Baseline 80 % limitation    PT LONG TERM GOAL #4   Title Pt return to the gym with a program that promotes core stability and avoids exercises that will irritate his back.                Plan - 11/16/15 1004    Clinical Impression Statement Pt reports muscle soreness but no increased pain with HEP. Review of HEP from last visit with increased reps and no increased pain. Pt reports IFC helpful with pain.    PT Next Visit Plan Continue HEP and progress as tolerated (pt has h/o umbilical hernia repair and inguinal hernias) , IFC for pain management       Patient will benefit from skilled therapeutic intervention in order to improve the following deficits and impairments:  Abnormal gait, Decreased activity tolerance, Decreased range of motion, Decreased endurance, Hypomobility, Pain, Impaired sensation, Improper body mechanics  Visit Diagnosis: Bilateral low back  pain with left-sided sciatica  Muscle weakness (generalized)  Difficulty in walking, not elsewhere classified     Problem List Patient Active Problem List   Diagnosis Date Noted  . Morbid obesity (HCC) 02/15/2012  . Back pain 01/04/2012  . Postlaminectomy syndrome, lumbar region 01/04/2012  . Opioid abuse, unspecified 01/04/2012  . Dyspnea 11/27/2011  . Bronchitis 09/22/2011  . Confusion 09/22/2011  . Abdominal pain 09/22/2011  . Constipation 09/22/2011  . Chronic back pain 09/22/2011  . Dehydration 09/22/2011  . Nausea & vomiting 09/22/2011    Sherrie Mustache, PTA 11/16/2015, 10:08 AM  [facility] [address] [place], [place], [address] Phone: [phone]   Fax:  [phone]  Name: Robert [name] MRN: [id number] Date of Birth: 05/16/1956

## 2015-11-17 DIAGNOSIS — Z981 Arthrodesis status: Secondary | ICD-10-CM | POA: Diagnosis not present

## 2015-11-17 DIAGNOSIS — M4806 Spinal stenosis, lumbar region: Secondary | ICD-10-CM | POA: Diagnosis not present

## 2015-11-21 ENCOUNTER — Ambulatory Visit: Payer: Medicare Other | Admitting: Physical Therapy

## 2015-11-21 DIAGNOSIS — M5442 Lumbago with sciatica, left side: Secondary | ICD-10-CM | POA: Diagnosis not present

## 2015-11-21 DIAGNOSIS — R262 Difficulty in walking, not elsewhere classified: Secondary | ICD-10-CM

## 2015-11-21 DIAGNOSIS — M6281 Muscle weakness (generalized): Secondary | ICD-10-CM | POA: Diagnosis not present
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
## 2015-11-23 ENCOUNTER — Ambulatory Visit: Payer: Medicare Other | Admitting: Physical Therapy

## 2015-11-23 DIAGNOSIS — M6281 Muscle weakness (generalized): Secondary | ICD-10-CM | POA: Diagnosis not present

## 2015-11-23 DIAGNOSIS — M5442 Lumbago with sciatica, left side: Secondary | ICD-10-CM | POA: Diagnosis not present

## 2015-11-23 DIAGNOSIS — R262 Difficulty in walking, not elsewhere classified: Secondary | ICD-10-CM | POA: Diagnosis not present

## 2015-11-23 DIAGNOSIS — M5416 Radiculopathy, lumbar region: Secondary | ICD-10-CM | POA: Diagnosis not present

## 2015-11-23 DIAGNOSIS — M545 Low back pain: Secondary | ICD-10-CM | POA: Diagnosis not present

## 2015-11-23 NOTE — Therapy (Signed)
[facility] [address] [place], [place], [address] Phone: [phone]   Fax:  [phone]  Physical Therapy Treatment  Patient Details  Name: Robert [name] MRN: [id number] Date of Birth: 08/21/1955 Referring Provider: Dr Herbert DeanerHarlan Daubert   Encounter Date: 11/23/2015      PT End of Session - 11/23/15 0938    Visit Number 5   Number of Visits 16   Date for PT Re-Evaluation 12/22/15   PT Start Time 0932   PT Stop Time 1015   PT Time Calculation (min) 43 min      Past Medical History  Diagnosis Date  . Arthritis   . Asthma   . Back pain   . Barrett esophagus   . Exposure to hepatitis B   . Exposure to hepatitis C   . Narcotic abuse   . OSA (obstructive sleep apnea)     on CPAP   . Depressive disorder   . Allergic rhinitis due to pollen     Past Surgical History  Procedure Laterality Date  . Back surgery    . Abdominal surgery    . Hernia repair    . Joint replacement    . Total knee arthroplasty  2009    left    There were no vitals filed for this visit.      Subjective Assessment - 11/23/15 0937    Currently in Pain? Yes   Pain Score 6    Pain Location Back   Pain Orientation Right;Left                         [facility] Adult PT Treatment/Exercise - 11/23/15 0001    Lumbar Exercises: Stretches   Lower Trunk Rotation 5 reps;30 seconds   Lumbar Exercises: Standing   Row 15 reps;Theraband   Theraband Level (Row) Level 3 (Green)   Lumbar Exercises: Seated   Other Seated Lumbar Exercises Seated on sit fot pelvic rocking, holding posterior pelvic tilt with breathing and then added clams while mainting Posterior pelvic tilt as well as alternating toe taps, alternating marching, seated dead bug   Lumbar Exercises: Supine   Heel Slides 10 reps   Heel Slides Limitations with ab draw in   Bent Knee Raise 10 reps   Bent Knee Raise Limitations with ab draw in   Knee/Hip Exercises: Stretches   Hip Flexor Stretch 1 rep;60 seconds                PT Education - 11/23/15 1026    Education provided Yes   Education Details Standing green band rows and extension   Person(s) Educated Patient   Methods Explanation   Comprehension Verbalized understanding          PT Short Term Goals - 11/21/15 1113    PT SHORT TERM GOAL #1   Title -Pt will increase core strength to good    Baseline improving   Time 4   Period Weeks   Status On-going   PT SHORT TERM GOAL #2   Title -Pt will be I w/ HEP for core strength and stretching    Time 4   Period Weeks   Status Achieved   PT SHORT TERM GOAL #3   Title -Pt will report centralized lumbar pain with no radiculopathy   Baseline some improvement   Time 4   Period Weeks   Status On-going   PT SHORT TERM GOAL #4   Title Pt  will report 4/10 pain at worst.    Time 4   Period Weeks   Status On-going           PT Long Term Goals - 11/21/15 1113    PT LONG TERM GOAL #1   Title Pt will sit for 1 hour without pain in order to drive in his car    Baseline 20 miniutes   Period Weeks   Status On-going   PT LONG TERM GOAL #2   Title Pt will stand for 1/2 hour in order to perform IADL's    Period Weeks   Status Achieved   PT LONG TERM GOAL #3   Title Pt's photo score will have a 55 % limitation    Period Weeks   Status On-going   PT LONG TERM GOAL #4   Title Pt return to the gym with a program that promotes core stability and avoids exercises that will irritate his back.    Period Weeks   Status On-going               Plan - 11/23/15 0945    Clinical Impression Statement Pt interested in purchasing dynadisc for care stability exercises at home. Pt given options for purchase. He is having lumbar injections today. He is compliant with his HEP and feels soreness in core with stability exercises.  Progressed supine core exercises with good tolerance and no increased pain. Also instructed pt in standing Rows and  shoulder extension without increased pain.    PT Next Visit Plan Continue HEP and progress as tolerated (pt has h/o umbilical hernia repair and inguinal hernias) Review standing bands, , IFC for pain management , did pt get inkjections?   PT Home Exercise Plan LTR, knee to chest, isometric hip flexion with ab set, ab set with clam, hip flexor stretch , standing rows and extension with green band      Patient will benefit from skilled therapeutic intervention in order to improve the following deficits and impairments:  Abnormal gait, Decreased activity tolerance, Decreased range of motion, Decreased endurance, Hypomobility, Pain, Impaired sensation, Improper body mechanics  Visit Diagnosis: Bilateral low back pain with left-sided sciatica  Muscle weakness (generalized)  Difficulty in walking, not elsewhere classified     Problem List Patient Active Problem List   Diagnosis Date Noted  . Morbid obesity (HCC) 02/15/2012  . Back pain 01/04/2012  . Postlaminectomy syndrome, lumbar region 01/04/2012  . Opioid abuse, unspecified 01/04/2012  . Dyspnea 11/27/2011  . Bronchitis 09/22/2011  . Confusion 09/22/2011  . Abdominal pain 09/22/2011  . Constipation 09/22/2011  . Chronic back pain 09/22/2011  . Dehydration 09/22/2011  . Nausea & vomiting 09/22/2011    Sherrie Mustache, PTA 11/23/2015, 10:26 AM  [facility] [address] [place], [place], [address] Phone: [phone]   Fax:  [phone]  Name: Robert [name] MRN: [id number] Date of Birth: 04/30/1956

## 2015-11-29 ENCOUNTER — Ambulatory Visit: Payer: Medicare Other | Admitting: Physical Therapy

## 2015-11-29 DIAGNOSIS — R262 Difficulty in walking, not elsewhere classified: Secondary | ICD-10-CM | POA: Diagnosis not present

## 2015-11-29 DIAGNOSIS — M6281 Muscle weakness (generalized): Secondary | ICD-10-CM

## 2015-11-29 DIAGNOSIS — M5442 Lumbago with sciatica, left side: Secondary | ICD-10-CM

## 2015-11-29 NOTE — Patient Instructions (Signed)

## 2015-11-29 NOTE — Therapy (Signed)
[facility] [address] [place], [place], [address] Phone: [phone]   Fax:  [phone]  Physical Therapy Treatment  Patient Details  Name: Robert [name] MRN: [id number] Date of Birth: 11-28-1955 Referring Provider: Dr Herbert DeanerHarlan Daubert   Encounter Date: 11/29/2015      PT End of Session - 11/29/15 0940    Visit Number 6   Number of Visits 16   Date for PT Re-Evaluation 12/22/15   Authorization Type medicare    PT Start Time 0933   PT Stop Time 1040   PT Time Calculation (min) 67 min      Past Medical History  Diagnosis Date  . Arthritis   . Asthma   . Back pain   . Barrett esophagus   . Exposure to hepatitis B   . Exposure to hepatitis C   . Narcotic abuse   . OSA (obstructive sleep apnea)     on CPAP   . Depressive disorder   . Allergic rhinitis due to pollen     Past Surgical History  Procedure Laterality Date  . Back surgery    . Abdominal surgery    . Hernia repair    . Joint replacement    . Total knee arthroplasty  2009    left    There were no vitals filed for this visit.      Subjective Assessment - 11/29/15 0936    Subjective I had the lumbar injections and the MD recommended I wait 2 days to do my HEP. The injections helped a little. Its easier to move.    Currently in Pain? Yes   Pain Score 5    Pain Location Back   Pain Orientation Left;Right   Pain Descriptors / Indicators Aching;Dull   Aggravating Factors  lifting and twisting   Pain Relieving Factors HMP, relaxing on my side.                         [facility] Adult PT Treatment/Exercise - 11/29/15 0001    Lumbar Exercises: Stretches   Lower Trunk Rotation 5 reps;30 seconds   Lumbar Exercises: Standing   Row 20 reps;Theraband   Theraband Level (Row) Level 3 (Green)   Shoulder Extension Strengthening   Theraband Level (Shoulder Extension) Level 3 (Green)   Lumbar Exercises: Supine   Clam 20 reps   Clam Limitations  with ab set   Heel Slides 20 reps   Heel Slides Limitations with ab draw in   Bent Knee Raise 20 reps   Bent Knee Raise Limitations with ab draw in   Isometric Hip Flexion 10 reps   Isometric Hip Flexion Limitations added opposites for obliques x 10   Knee/Hip Exercises: Stretches   Active Hamstring Stretch 3 reps;30 seconds   Hip Flexor Stretch 1 rep;60 seconds   Other Knee/Hip Stretches Butterfly stretch- continued to get a cramp in  left hamstring  better with one leg at a time.    Programme researcher, broadcasting/film/videolectrical Stimulation   Electrical Stimulation Location lumbar spine side lying on the R with pillow between the legs.    Electrical Stimulation Action IFC x 15 minutes   Electrical Stimulation Goals Pain                PT Education - 11/29/15 1035    Education provided Yes   Education Details Pre pilates HEP   Person(s) Educated Patient   Methods Explanation;Handout   Comprehension Verbalized understanding  PT Short Term Goals - 11/21/15 1113    PT SHORT TERM GOAL #1   Title -Pt will increase core strength to good    Baseline improving   Time 4   Period Weeks   Status On-going   PT SHORT TERM GOAL #2   Title -Pt will be I w/ HEP for core strength and stretching    Time 4   Period Weeks   Status Achieved   PT SHORT TERM GOAL #3   Title -Pt will report centralized lumbar pain with no radiculopathy   Baseline some improvement   Time 4   Period Weeks   Status On-going   PT SHORT TERM GOAL #4   Title Pt will report 4/10 pain at worst.    Time 4   Period Weeks   Status On-going           PT Long Term Goals - 11/21/15 1113    PT LONG TERM GOAL #1   Title Pt will sit for 1 hour without pain in order to drive in his car    Baseline 20 miniutes   Period Weeks   Status On-going   PT LONG TERM GOAL #2   Title Pt will stand for 1/2 hour in order to perform IADL's    Period Weeks   Status Achieved   PT LONG TERM GOAL #3   Title Pt's photo score will have a 55 %  limitation    Period Weeks   Status On-going   PT LONG TERM GOAL #4   Title Pt return to the gym with a program that promotes core stability and avoids exercises that will irritate his back.    Period Weeks   Status On-going               Plan - 11/29/15 1000    Clinical Impression Statement Pt able to tolerate 45 minute drive to MD appointment. Progressing toward LTG #1, Pt also reports decreased radiculopathy, progressing toward remaining STGs. Continued focus on core strength and flexibility without increased pain. [place] HEP.    PT Next Visit Plan Continue HEP and progress as tolerated (pt has h/o umbilical hernia repair and inguinal hernias)  IFC for pain management    PT Home Exercise Plan LTR, knee to chest, isometric hip flexion with ab set, ab set with clam, hip flexor stretch , standing rows and extension with green band, prepilates HEP      Patient will benefit from skilled therapeutic intervention in order to improve the following deficits and impairments:  Abnormal gait, Decreased activity tolerance, Decreased range of motion, Decreased endurance, Hypomobility, Pain, Impaired sensation, Improper body mechanics  Visit Diagnosis: Bilateral low back pain with left-sided sciatica  Muscle weakness (generalized)  Difficulty in walking, not elsewhere classified     Problem List Patient Active Problem List   Diagnosis Date Noted  . Morbid obesity (HCC) 02/15/2012  . Back pain 01/04/2012  . Postlaminectomy syndrome, lumbar region 01/04/2012  . Opioid abuse, unspecified 01/04/2012  . Dyspnea 11/27/2011  . Bronchitis 09/22/2011  . Confusion 09/22/2011  . Abdominal pain 09/22/2011  . Constipation 09/22/2011  . Chronic back pain 09/22/2011  . Dehydration 09/22/2011  . Nausea & vomiting 09/22/2011    Sherrie Mustache, PTA 11/29/2015, 10:36 AM  [facility] [address] [place], [place], [address] Phone:  [phone]   Fax:  [phone]  Name: Robert [name] MRN: [id number] Date of Birth: 18-May-1956

## 2015-12-01 ENCOUNTER — Ambulatory Visit: Payer: Medicare Other | Admitting: Physical Therapy

## 2015-12-06 ENCOUNTER — Ambulatory Visit: Payer: Medicare Other | Admitting: Physical Therapy

## 2015-12-06 ENCOUNTER — Encounter: Payer: Self-pay | Admitting: Physical Therapy

## 2015-12-06 DIAGNOSIS — M6281 Muscle weakness (generalized): Secondary | ICD-10-CM | POA: Diagnosis not present

## 2015-12-06 DIAGNOSIS — M5442 Lumbago with sciatica, left side: Secondary | ICD-10-CM | POA: Diagnosis not present

## 2015-12-06 DIAGNOSIS — R262 Difficulty in walking, not elsewhere classified: Secondary | ICD-10-CM

## 2015-12-06 NOTE — Therapy (Signed)
[facility] [address] [place], [place], [address] Phone: [phone]   Fax:  [phone]  Physical Therapy Treatment  Patient Details  Name: Robert [name] MRN: [id number] Date of Birth: Feb 17, 1956 Referring Provider: Dr Herbert Deaner Daubert   Encounter Date: 12/06/2015      PT End of Session - 12/06/15 1004    Visit Number 7   Number of Visits 16   Date for PT Re-Evaluation 12/22/15   Authorization Type medicare    PT Start Time 0926   PT Stop Time 1030   PT Time Calculation (min) 64 min      Past Medical History  Diagnosis Date  . Arthritis   . Asthma   . Back pain   . Barrett esophagus   . Exposure to hepatitis B   . Exposure to hepatitis C   . Narcotic abuse   . OSA (obstructive sleep apnea)     on CPAP   . Depressive disorder   . Allergic rhinitis due to pollen     Past Surgical History  Procedure Laterality Date  . Back surgery    . Abdominal surgery    . Hernia repair    . Joint replacement    . Total knee arthroplasty  2009    left    There were no vitals filed for this visit.      Subjective Assessment - 12/06/15 0929    Subjective Injections above seem to be helping, needs to schedule for injections below surgery site. Has been doing HEP, rest of the body has been complaining. Reports cont numbness in R thigh and groin.    How long can you sit comfortably? 30 min   How long can you stand comfortably? 10-15 min before leg goes numb   Currently in Pain? Yes   Pain Score 6    Pain Descriptors / Indicators Aching;[facility] Adult PT Treatment/Exercise - 12/06/15 0001    Exercises   Exercises Lumbar   Lumbar Exercises: Stretches   Lower Trunk Rotation Other (comment)  10 reps   Passive Hamstring Stretch 30 seconds   Passive Hamstring Stretch Limitations seated EOB   Lumbar Exercises: Aerobic   Stationary Bike NuStep 10 min L5   Lumbar Exercises:  Standing   Heel Raises 15 reps   Heel Raises Limitations UE on PB on high table   Lumbar Exercises: Seated   Other Seated Lumbar Exercises seated pelvic tilt with UE abd GTB, marches, antirotation RTB    Electrical Stimulation   Electrical Stimulation Location lumbar spine side lying on the R with pillow between the legs.    Electrical Stimulation Action IFC 15 min   Electrical Stimulation Goals Pain                PT Education - 12/06/15 1004    Education provided Yes   Education Details exercise form/rationale, drinking water, water walking/bike riding for exercise, cues for gait pattern   Person(s) Educated Patient   Methods Explanation;Demonstration;Tactile cues;Verbal cues   Comprehension Verbalized understanding;Returned demonstration;Verbal cues required;Tactile cues required;Need further instruction          PT Short Term Goals - 11/21/15 1113    PT SHORT TERM GOAL #1   Title -Pt will increase core strength to good    Baseline improving   Time 4  Period Weeks   Status On-going   PT SHORT TERM GOAL #2   Title -Pt will be I w/ HEP for core strength and stretching    Time 4   Period Weeks   Status Achieved   PT SHORT TERM GOAL #3   Title -Pt will report centralized lumbar pain with no radiculopathy   Baseline some improvement   Time 4   Period Weeks   Status On-going   PT SHORT TERM GOAL #4   Title Pt will report 4/10 pain at worst.    Time 4   Period Weeks   Status On-going           PT Long Term Goals - 11/21/15 1113    PT LONG TERM GOAL #1   Title Pt will sit for 1 hour without pain in order to drive in his car    Baseline 20 miniutes   Period Weeks   Status On-going   PT LONG TERM GOAL #2   Title Pt will stand for 1/2 hour in order to perform IADL's    Period Weeks   Status Achieved   PT LONG TERM GOAL #3   Title Pt's photo score will have a 55 % limitation    Period Weeks   Status On-going   PT LONG TERM GOAL #4   Title Pt return  to the gym with a program that promotes core stability and avoids exercises that will irritate his back.    Period Weeks   Status On-going               Plan - 12/06/15 1127    Clinical Impression Statement Pt responded positively to exercise and was able to maintain appropriate core contraction with minimal cuing from PT. Notable difficulty with antirotation with resistance from R. Will continue to benefit fom skilled PT in order to stabilize and strengthen lumbo pelvic region and train appropriate gait so pt can return to PLOF.    Clinical Impairments Affecting Rehab Potential old spinal fracture and fixation    PT Treatment/Interventions ADLs/Self Care Home Management;Cryotherapy;Electrical Stimulation;Moist Heat;Traction;Therapeutic exercise;Therapeutic activities;Functional mobility training;Ultrasound;Manual techniques;Taping;Passive range of motion;Dry needling   PT Next Visit Plan Continue HEP and progress as tolerated (pt has h/o umbilical hernia repair and inguinal hernias)  IFC for pain management, review effects of Nustep   PT Home Exercise Plan LTR, knee to chest, isometric hip flexion with ab set, ab set with clam, hip flexor stretch , standing rows and extension with green band, prepilates HEP   Consulted and Agree with Plan of Care Patient      Patient will benefit from skilled therapeutic intervention in order to improve the following deficits and impairments:  Abnormal gait, Decreased activity tolerance, Decreased range of motion, Decreased endurance, Hypomobility, Pain, Impaired sensation, Improper body mechanics  Visit Diagnosis: Bilateral low back pain with left-sided sciatica  Muscle weakness (generalized)  Difficulty in walking, not elsewhere classified     Problem List Patient Active Problem List   Diagnosis Date Noted  . Morbid obesity (HCC) 02/15/2012  . Back pain 01/04/2012  . Postlaminectomy syndrome, lumbar region 01/04/2012  . Opioid abuse,  unspecified 01/04/2012  . Dyspnea 11/27/2011  . Bronchitis 09/22/2011  . Confusion 09/22/2011  . Abdominal pain 09/22/2011  . Constipation 09/22/2011  . Chronic back pain 09/22/2011  . Dehydration 09/22/2011  . Nausea & vomiting 09/22/2011    Willadene Mounsey C. Afomia Blackley PT, DPT 12/06/2015 11:30 AM   [facility] [address]  [address] [place], [place], [address] Phone: [phone]   Fax:  [phone]  Name: Robert [name] MRN: [id number] Date of Birth: July 10, 1956

## 2015-12-08 ENCOUNTER — Ambulatory Visit: Payer: Medicare Other | Admitting: Physical Therapy

## 2015-12-08 DIAGNOSIS — R262 Difficulty in walking, not elsewhere classified: Secondary | ICD-10-CM | POA: Diagnosis not present

## 2015-12-08 DIAGNOSIS — M6281 Muscle weakness (generalized): Secondary | ICD-10-CM

## 2015-12-08 DIAGNOSIS — M5442 Lumbago with sciatica, left side: Secondary | ICD-10-CM
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
## 2015-12-13 ENCOUNTER — Ambulatory Visit: Payer: Medicare Other | Admitting: Physical Therapy

## 2015-12-15 ENCOUNTER — Ambulatory Visit: Payer: Medicare Other | Attending: Orthopedic Surgery | Admitting: Physical Therapy

## 2015-12-15 DIAGNOSIS — M5137 Other intervertebral disc degeneration, lumbosacral region: Secondary | ICD-10-CM | POA: Diagnosis not present

## 2015-12-15 DIAGNOSIS — M47817 Spondylosis without myelopathy or radiculopathy, lumbosacral region: Secondary | ICD-10-CM | POA: Diagnosis not present

## 2015-12-15 DIAGNOSIS — M6281 Muscle weakness (generalized): Secondary | ICD-10-CM | POA: Insufficient documentation

## 2015-12-15 DIAGNOSIS — R262 Difficulty in walking, not elsewhere classified: Secondary | ICD-10-CM | POA: Insufficient documentation

## 2015-12-15 DIAGNOSIS — G894 Chronic pain syndrome: Secondary | ICD-10-CM | POA: Diagnosis not present

## 2015-12-15 DIAGNOSIS — Z79899 Other long term (current) drug therapy: Secondary | ICD-10-CM | POA: Diagnosis not present

## 2015-12-15 DIAGNOSIS — M961 Postlaminectomy syndrome, not elsewhere classified: Secondary | ICD-10-CM | POA: Diagnosis not present

## 2015-12-15 DIAGNOSIS — M5442 Lumbago with sciatica, left side: Secondary | ICD-10-CM | POA: Insufficient documentation

## 2015-12-15 DIAGNOSIS — Z79891 Long term (current) use of opiate analgesic: Secondary | ICD-10-CM | POA: Diagnosis not present

## 2015-12-27 ENCOUNTER — Ambulatory Visit: Payer: Medicare Other | Admitting: Physical Therapy

## 2015-12-27 ENCOUNTER — Encounter: Payer: Self-pay | Admitting: Physical Therapy

## 2015-12-27 DIAGNOSIS — M5442 Lumbago with sciatica, left side: Secondary | ICD-10-CM

## 2015-12-27 DIAGNOSIS — M6281 Muscle weakness (generalized): Secondary | ICD-10-CM | POA: Diagnosis not present

## 2015-12-27 DIAGNOSIS — R262 Difficulty in walking, not elsewhere classified: Secondary | ICD-10-CM
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
## 2015-12-29 ENCOUNTER — Encounter: Payer: Medicare Other | Admitting: Physical Therapy

## 2015-12-29 DIAGNOSIS — M5417 Radiculopathy, lumbosacral region: Secondary | ICD-10-CM | POA: Diagnosis not present

## 2015-12-30 ENCOUNTER — Ambulatory Visit: Payer: Medicare Other | Admitting: Physical Therapy

## 2015-12-30 DIAGNOSIS — R262 Difficulty in walking, not elsewhere classified: Secondary | ICD-10-CM

## 2015-12-30 DIAGNOSIS — M5442 Lumbago with sciatica, left side: Secondary | ICD-10-CM | POA: Diagnosis not present

## 2015-12-30 DIAGNOSIS — M6281 Muscle weakness (generalized): Secondary | ICD-10-CM

## 2015-12-30 NOTE — Therapy (Signed)
[facility] Outpatient Rehabilitation [facility] [address] [place], [place], [address] Phone: [phone]   Fax:  [phone]  Physical Therapy Treatment  Patient Details  Name: Robert [name] MRN: [id number] Date of Birth: 11-23-1955 Referring Provider: Dr Herbert Deaner Daubert   Encounter Date: 12/30/2015      PT End of Session - 12/30/15 0858    Visit Number 10   Number of Visits 16   Date for PT Re-Evaluation 02/07/16   Authorization Type medicare    PT Start Time 0845   PT Stop Time 0945   PT Time Calculation (min) 60 min      Past Medical History  Diagnosis Date  . Arthritis   . Asthma   . Back pain   . Barrett esophagus   . Exposure to hepatitis B   . Exposure to hepatitis C   . Narcotic abuse   . OSA (obstructive sleep apnea)     on CPAP   . Depressive disorder   . Allergic rhinitis due to pollen     Past Surgical History  Procedure Laterality Date  . Back surgery    . Abdominal surgery    . Hernia repair    . Joint replacement    . Total knee arthroplasty  2009    left    There were no vitals filed for this visit.      Subjective Assessment - 12/30/15 0852    Subjective I am ever so much more flexible   Currently in Pain? Yes   Pain Score 6    Pain Location Back            [facility] PT Assessment - 12/30/15 0001    Observation/Other Assessments   Focus on Therapeutic Outcomes (FOTO)  50% limited improved from 80% limited on eval                     [facility] Adult PT Treatment/Exercise - 12/30/15 0001    Lumbar Exercises: Stretches   Prone Mid Back Stretch Limitations seated physioball roll outs x10   Lumbar Exercises: Aerobic   Stationary Bike NuStep 10 min L6   Lumbar Exercises: Standing   Row 20 reps;Theraband   Theraband Level (Row) Level 3 (Green)   Shoulder Extension Strengthening   Theraband Level (Shoulder Extension) Level 3 (Green)   Other Standing Lumbar Exercises Standing rotational stabilization with  green band x1 5 each side in staggered stand    Other Standing Lumbar Exercises Standing 3 point rotational reach x5 each side   Lumbar Exercises: Seated   Long Arc Quad on Chair 10 reps   LAQ on Chair Limitations BUE reach forward, 3s holds   Electrical Stimulation   Electrical Stimulation Location lumbar spine side lying on the R with pillow between the legs.    Electrical Stimulation Action IFC x 15    Electrical Stimulation Parameters to tolerance    Electrical Stimulation Goals Pain                  PT Short Term Goals - 12/27/15 1020    PT SHORT TERM GOAL #1   Title -Pt will increase core strength to good    PT SHORT TERM GOAL #2   Title -Pt will be I w/ HEP for core strength and stretching    Time 4   Period Weeks   Status Achieved   PT SHORT TERM GOAL #3   Title -Pt will report centralized lumbar pain with  no radiculopathy   Baseline stops above knee after doing a lot of flexion motions with exercises.    Time 4   Period Weeks   Status On-going   PT SHORT TERM GOAL #4   Title Pt will report 4/10 pain at worst.    Baseline 7/10, morning, due to being bend forward and to the R   Time 4   Period Weeks   Status On-going           PT Long Term Goals - 12/08/15 1307    PT LONG TERM GOAL #1   Title Pt will sit for 1 hour without pain in order to drive in his car    Baseline 20 miniutes   Time 8   Period Weeks   Status On-going   PT LONG TERM GOAL #2   Title Pt will stand for 1/2 hour in order to perform IADL's    Baseline can not stand > 5 min    Time 8   Period Weeks   Status Achieved   PT LONG TERM GOAL #3   Title Pt's photo score will have a 55 % limitation    Baseline 80 % limitation    Time 8   Period Weeks   Status On-going   PT LONG TERM GOAL #4   Title Pt return to the gym with a program that promotes core stability and avoids exercises that will irritate his back.    Time 8   Period Weeks   Status On-going               Plan  - 12/30/15 0930    Clinical Impression Statement Continued standing and seated stabilization exercises while avoiding hip flexion. No increased leg pain. IFC for pain. Pt is still trying to save money for purchase.    PT Next Visit Plan  Continue HEP and progress as tolerated (pt has h/o umbilical hernia repair and inguinal hernias)  IFC for pain management   PT Home Exercise Plan LTR, knee to chest, ab set with clam, hip flexor stretch , standing rows and extension with green band, prepilates HEP- except hip flexion      Patient will benefit from skilled therapeutic intervention in order to improve the following deficits and impairments:  Abnormal gait, Decreased activity tolerance, Decreased range of motion, Decreased endurance, Hypomobility, Pain, Impaired sensation, Improper body mechanics  Visit Diagnosis: Bilateral low back pain with left-sided sciatica  Muscle weakness (generalized)  Difficulty in walking, not elsewhere classified     Problem List Patient Active Problem List   Diagnosis Date Noted  . Morbid obesity (HCC) 02/15/2012  . Back pain 01/04/2012  . Postlaminectomy syndrome, lumbar region 01/04/2012  . Opioid abuse, unspecified 01/04/2012  . Dyspnea 11/27/2011  . Bronchitis 09/22/2011  . Confusion 09/22/2011  . Abdominal pain 09/22/2011  . Constipation 09/22/2011  . Chronic back pain 09/22/2011  . Dehydration 09/22/2011  . Nausea & vomiting 09/22/2011    Sherrie Mustacheonoho, Dasani Crear McGee, PTA 12/30/2015, 9:50 AM  [facility] [address] [place], [place], [address] Phone: [phone]   Fax:  [phone]  Name: Robert [name] MRN: [id number] Date of Birth: 1955/11/12

## 2016-01-05 ENCOUNTER — Ambulatory Visit: Payer: Medicare Other | Admitting: Physical Therapy

## 2016-01-06 ENCOUNTER — Ambulatory Visit: Payer: Medicare Other | Admitting: Physical Therapy

## 2016-01-06 DIAGNOSIS — M5442 Lumbago with sciatica, left side: Secondary | ICD-10-CM

## 2016-01-06 DIAGNOSIS — R262 Difficulty in walking, not elsewhere classified: Secondary | ICD-10-CM

## 2016-01-06 DIAGNOSIS — M6281 Muscle weakness (generalized): Secondary | ICD-10-CM | POA: Diagnosis not present

## 2016-01-06 NOTE — Therapy (Signed)
[facility] [address] [place], [place], [address] Phone: [phone]   Fax:  [phone]  Physical Therapy Treatment  Patient Details  Name: Robert D Sw[place] MRN: [id number] Date of Birth: 1956-03-31 Referring Provider: Dr Herbert DeanerHarlan Daubert   Encounter Date: 01/06/2016      PT End of Session - 01/06/16 0902    Visit Number 11   Number of Visits 16   Date for PT Re-Evaluation 02/07/16   Authorization Type medicare    PT Start Time [id number]   PT Stop Time 0945   PT Time Calculation (min) 47 min      Past Medical History  Diagnosis Date  . Arthritis   . Asthma   . Back pain   . Barrett esophagus   . Exposure to hepatitis B   . Exposure to hepatitis C   . Narcotic abuse   . OSA (obstructive sleep apnea)     on CPAP   . Depressive disorder   . Allergic rhinitis due to pollen     Past Surgical History  Procedure Laterality Date  . Back surgery    . Abdominal surgery    . Hernia repair    . Joint replacement    . Total knee arthroplasty  2009    left    There were no vitals filed for this visit.      Subjective Assessment - 01/06/16 0903    Subjective I am doing okay   Currently in Pain? Yes   Pain Score 5    Pain Location Back   Pain Orientation Lower;Right;Left;Mid   Pain Descriptors / Indicators Aching   Aggravating Factors  reaching over head, bending to do laundry   Pain Relieving Factors MHP, laying on either side                         [facility] Adult PT Treatment/Exercise - 01/06/16 0001    Lumbar Exercises: Aerobic   Stationary Bike Nustep L5 x 5 minutes   Lumbar Exercises: Standing   Other Standing Lumbar Exercises Standing with back to wall, pelvic tilts x 10, then hold pelvic tilt with UE flexion, green band horizontal abduction-cues for neutral spine, towel behind head.    Lumbar Exercises: Seated   Other Seated Lumbar Exercises Seated thoracic extension over back of chair 10 sec x  5   Lumbar Exercises: Sidelying   Other Sidelying Lumbar Exercises upper trunk rotation book openings x 3 each side 30 sec   Electrical Stimulation   Electrical Stimulation Location lumbar spine side lying on the R with pillow between the legs.    Electrical Stimulation Action IFC x 15    Electrical Stimulation Parameters 16   Electrical Stimulation Goals Pain                  PT Short Term Goals - 01/06/16 0923    PT SHORT TERM GOAL #1   Title -Pt will increase core strength to good    Baseline improving   Time 4   Period Weeks   Status On-going   PT SHORT TERM GOAL #2   Title -Pt will be I w/ HEP for core strength and stretching    Time 4   Period Weeks   Status Achieved   PT SHORT TERM GOAL #3   Title -Pt will report centralized lumbar pain with no radiculopathy   Time 4   Period Weeks   Status On-going  PT SHORT TERM GOAL #4   Title Pt will report 4/10 pain at worst.    Baseline 7/10 in the mornings, sometimes as low as 5/10    Time 4   Period Weeks   Status On-going           PT Long Term Goals - 01/06/16 9604    PT LONG TERM GOAL #1   Title Pt will sit for 1 hour without pain in order to drive in his car    Baseline 20 minutes   Time 8   Period Weeks   Status On-going   PT LONG TERM GOAL #2   Title Pt will stand for 1/2 hour in order to perform IADL's    Baseline can stand/walk 30 minutes   Time 8   Period Weeks   Status Achieved   PT LONG TERM GOAL #3   Title Pt's photo score will have a 55 % limitation    Baseline 80 % limitation    Time 8   Period Weeks   Status On-going   PT LONG TERM GOAL #4   Title Pt return to the gym with a program that promotes core stability and avoids exercises that will irritate his back.    Time 8   Period Weeks   Status On-going               Plan - 01/06/16 0917    Clinical Impression Statement Pt came on wrong day however able to work him into schedule. Pt reports decreased radicular symptoms  in L LE however right sie still present. He has ddifficulty with increased back pain when reaching over head. He deomonstrates decreased thoracic mobility and shoulder flexion ROM bilateral. Instructed pt in standing pelvic tilts against wall with UE flexion and horizontal abduction/adduction exercises. Also instructed pt in upper trunk rotation and seated thoracic extension stretch over back of chair. These stretches were added to HEP. Pt reports increased strain in calves with standing against wall which eased when he brought his feet closer to the wall.    PT Next Visit Plan  Continue HEP and progress as tolerated; continue to work on standing core/ thoracic mobility exercises (pt has h/o umbilical hernia repair and inguinal hernias)  IFC for pain management   PT Home Exercise Plan LTR, knee to chest, ab set with clam, hip flexor stretch , standing rows and extension with green band, prepilates HEP- except hip flexion, standing rotation stabilization with green band, thoracic extension over chair, upper trunk rotation book openings.       Patient will benefit from skilled therapeutic intervention in order to improve the following deficits and impairments:  Abnormal gait, Decreased activity tolerance, Decreased range of motion, Decreased endurance, Hypomobility, Pain, Impaired sensation, Improper body mechanics  Visit Diagnosis: Bilateral low back pain with left-sided sciatica  Muscle weakness (generalized)  Difficulty in walking, not elsewhere classified     Problem List Patient Active Problem List   Diagnosis Date Noted  . Morbid obesity (HCC) 02/15/2012  . Back pain 01/04/2012  . Postlaminectomy syndrome, lumbar region 01/04/2012  . Opioid abuse, unspecified 01/04/2012  . Dyspnea 11/27/2011  . Bronchitis 09/22/2011  . Confusion 09/22/2011  . Abdominal pain 09/22/2011  . Constipation 09/22/2011  . Chronic back pain 09/22/2011  . Dehydration 09/22/2011  . Nausea & vomiting  09/22/2011    Jannette Spanner [place] [facility] 01/06/2016, 12:16 PM  [facility] [address] [place], [place], [address] Phone: [phone]  Fax:  [phone]  Name: Demarkis D [place] MRN: [id number] Date of Birth: 1955/07/29

## 2016-01-11 ENCOUNTER — Ambulatory Visit: Payer: Medicare Other | Admitting: Physical Therapy

## 2016-01-11 DIAGNOSIS — R262 Difficulty in walking, not elsewhere classified: Secondary | ICD-10-CM

## 2016-01-11 DIAGNOSIS — M6281 Muscle weakness (generalized): Secondary | ICD-10-CM

## 2016-01-11 DIAGNOSIS — M5442 Lumbago with sciatica, left side: Secondary | ICD-10-CM

## 2016-01-11 NOTE — Therapy (Signed)
[facility] Outpatient Rehabilitation [facility] [address] [place], [place], [address] Phone: [phone]   Fax:  [phone]  Physical Therapy Treatment  Patient Details  Name: Robert [name] MRN: [id number] Date of Birth: 22-Jan-1956 Referring Provider: Dr Herbert Deaner Daubert   Encounter Date: 01/11/2016      PT End of Session - 01/11/16 1152    Visit Number 12   Number of Visits 16   Date for PT Re-Evaluation 02/07/16   Authorization Type medicare    PT Start Time 1150   PT Stop Time 1243   PT Time Calculation (min) 53 min      Past Medical History  Diagnosis Date  . Arthritis   . Asthma   . Back pain   . Barrett esophagus   . Exposure to hepatitis B   . Exposure to hepatitis C   . Narcotic abuse   . OSA (obstructive sleep apnea)     on CPAP   . Depressive disorder   . Allergic rhinitis due to pollen     Past Surgical History  Procedure Laterality Date  . Back surgery    . Abdominal surgery    . Hernia repair    . Joint replacement    . Total knee arthroplasty  2009    left    There were no vitals filed for this visit.      Subjective Assessment - 01/11/16 1222    Subjective I think the pelvic tilts on the wall have aggravated my sciatica.    Currently in Pain? Yes   Pain Score 6    Pain Location Back   Pain Orientation Lower;Left;Mid                         [facility] Adult PT Treatment/Exercise - 01/11/16 0001    Lumbar Exercises: Aerobic   Stationary Bike Nustep L5 x 8 minutes   Lumbar Exercises: Standing   Other Standing Lumbar Exercises Standing at wall modified exercise to only contract abdominals instead while perfroming posterior pelvic tilt -maintaining core brace, horizontal abduction with green band x 20, ER x 20   Other Standing Lumbar Exercises Standing rotation stabilization green band in semi tandem stance x 15 each side, the D2 flexion x 10 each with green band and normal stance.    Lumbar Exercises: Seated    Other Seated Lumbar Exercises Seated thoracic extension over back of chair 1 minute x 2   Lumbar Exercises: Sidelying   Other Sidelying Lumbar Exercises upper trunk rotation book openings x 3 each side 30 sec   Electrical Stimulation   Electrical Stimulation Location lumbar spine side lying on the R with pillow between the legs.    Electrical Stimulation Action IFC x 15 min   Electrical Stimulation Parameters 20   Electrical Stimulation Goals Pain                  PT Short Term Goals - 01/06/16 0923    PT SHORT TERM GOAL #1   Title -Pt will increase core strength to good    Baseline improving   Time 4   Period Weeks   Status On-going   PT SHORT TERM GOAL #2   Title -Pt will be I w/ HEP for core strength and stretching    Time 4   Period Weeks   Status Achieved   PT SHORT TERM GOAL #3   Title -Pt will report centralized lumbar pain with no  radiculopathy   Time 4   Period Weeks   Status On-going   PT SHORT TERM GOAL #4   Title Pt will report 4/10 pain at worst.    Baseline 7/10 in the mornings, sometimes as low as 5/10    Time 4   Period Weeks   Status On-going           PT Long Term Goals - 01/06/16 0254    PT LONG TERM GOAL #1   Title Pt will sit for 1 hour without pain in order to drive in his car    Baseline 20 minutes   Time 8   Period Weeks   Status On-going   PT LONG TERM GOAL #2   Title Pt will stand for 1/2 hour in order to perform IADL's    Baseline can stand/walk 30 minutes   Time 8   Period Weeks   Status Achieved   PT LONG TERM GOAL #3   Title Pt's photo score will have a 55 % limitation    Baseline 80 % limitation    Time 8   Period Weeks   Status On-going   PT LONG TERM GOAL #4   Title Pt return to the gym with a program that promotes core stability and avoids exercises that will irritate his back.    Time 8   Period Weeks   Status On-going               Plan - 01/11/16 1228    Clinical Impression Statement Pt  reports he tried the pelvic tilts at the wall over the weekend and he thinks it may have aggravated his sciatica. Asked pt to omit these exercises. We perfromed abdominal bracing while maintaining neutral spine and performed UE band exercises. He has some discomfort maintaining neutral spine. Repeated standing rotational stabilization exercises with semi tandem stand. Pt reports various pains throughout treatment however reports he is okay and wants to proceed. He admits to looking forward to the Estim at the end of session. He is still trying to [place] some extra money to purchase one for home. Reviewed book opening stretch and thoracic extension stretch which pt reports feel of benefit. No additional goals met due to exacerbation of pain.    PT Next Visit Plan  Continue HEP and progress as tolerated; continue to work on standing core/ thoracic mobility exercises (pt has h/o umbilical hernia repair and inguinal hernias)  IFC for pain management      Patient will benefit from skilled therapeutic intervention in order to improve the following deficits and impairments:  Abnormal gait, Decreased activity tolerance, Decreased range of motion, Decreased endurance, Hypomobility, Pain, Impaired sensation, Improper body mechanics  Visit Diagnosis: Bilateral low back pain with left-sided sciatica  Muscle weakness (generalized)  Difficulty in walking, not elsewhere classified     Problem List Patient Active Problem List   Diagnosis Date Noted  . Morbid obesity (HCC) 02/15/2012  . Back pain 01/04/2012  . Postlaminectomy syndrome, lumbar region 01/04/2012  . Opioid abuse, unspecified 01/04/2012  . Dyspnea 11/27/2011  . Bronchitis 09/22/2011  . Confusion 09/22/2011  . Abdominal pain 09/22/2011  . Constipation 09/22/2011  . Chronic back pain 09/22/2011  . Dehydration 09/22/2011  . Nausea & vomiting 09/22/2011    Sherrie Mustache, PTA 01/11/2016, 12:35 PM  [facility] [address] [place], [place], [address] Phone: [phone]   Fax:  [phone]  Name: Robert [name] MRN: [id number]  Date of Birth: 1955-09-08

## 2016-01-13 ENCOUNTER — Ambulatory Visit: Payer: Medicare Other | Admitting: Physical Therapy

## 2016-01-13 DIAGNOSIS — R262 Difficulty in walking, not elsewhere classified: Secondary | ICD-10-CM

## 2016-01-13 DIAGNOSIS — M5442 Lumbago with sciatica, left side: Secondary | ICD-10-CM

## 2016-01-13 DIAGNOSIS — M6281 Muscle weakness (generalized): Secondary | ICD-10-CM
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
## 2016-01-18 ENCOUNTER — Ambulatory Visit: Payer: No Typology Code available for payment source | Attending: Orthopedic Surgery | Admitting: Physical Therapy

## 2016-01-18 DIAGNOSIS — M6281 Muscle weakness (generalized): Secondary | ICD-10-CM | POA: Diagnosis not present

## 2016-01-18 DIAGNOSIS — R262 Difficulty in walking, not elsewhere classified: Secondary | ICD-10-CM | POA: Diagnosis not present

## 2016-01-18 DIAGNOSIS — M5442 Lumbago with sciatica, left side: Secondary | ICD-10-CM | POA: Diagnosis not present
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
## 2016-01-19 DIAGNOSIS — M4696 Unspecified inflammatory spondylopathy, lumbar region: Secondary | ICD-10-CM | POA: Diagnosis not present

## 2016-01-19 DIAGNOSIS — M47817 Spondylosis without myelopathy or radiculopathy, lumbosacral region: Secondary | ICD-10-CM | POA: Diagnosis not present

## 2016-01-19 DIAGNOSIS — M5137 Other intervertebral disc degeneration, lumbosacral region: Secondary | ICD-10-CM | POA: Diagnosis not present

## 2016-01-19 DIAGNOSIS — M961 Postlaminectomy syndrome, not elsewhere classified: Secondary | ICD-10-CM | POA: Diagnosis not present

## 2016-01-19 DIAGNOSIS — G894 Chronic pain syndrome: Secondary | ICD-10-CM | POA: Diagnosis not present

## 2016-01-19 DIAGNOSIS — Z79891 Long term (current) use of opiate analgesic: Secondary | ICD-10-CM | POA: Diagnosis not present

## 2016-01-19 DIAGNOSIS — Z79899 Other long term (current) drug therapy: Secondary | ICD-10-CM | POA: Diagnosis not present

## 2016-01-20 ENCOUNTER — Ambulatory Visit: Payer: No Typology Code available for payment source | Admitting: Physical Therapy

## 2016-01-20 DIAGNOSIS — M5442 Lumbago with sciatica, left side: Secondary | ICD-10-CM | POA: Diagnosis not present

## 2016-01-20 DIAGNOSIS — M6281 Muscle weakness (generalized): Secondary | ICD-10-CM | POA: Diagnosis not present

## 2016-01-20 DIAGNOSIS — R262 Difficulty in walking, not elsewhere classified: Secondary | ICD-10-CM | POA: Diagnosis not present

## 2016-01-20 NOTE — Therapy (Signed)
[facility] [address] [place], [place], [address] Phone: [phone]   Fax:  [phone]  Physical Therapy Treatment  Patient Details  Name: Robert [name] MRN: [id number] Date of Birth: 07-13-1956 Referring Provider: Dr Herbert DeanerHarlan Daubert   Encounter Date: 01/20/2016      PT End of Session - 01/20/16 0937    Visit Number 15   Number of Visits 16   Date for PT Re-Evaluation 02/07/16   Authorization Type medicare    PT Start Time 0932   PT Stop Time 1028   PT Time Calculation (min) 56 min   Activity Tolerance Patient tolerated treatment well   Behavior During Therapy [facility] for tasks assessed/performed      Past Medical History  Diagnosis Date  . Arthritis   . Asthma   . Back pain   . Barrett esophagus   . Exposure to hepatitis B   . Exposure to hepatitis C   . Narcotic abuse   . OSA (obstructive sleep apnea)     on CPAP   . Depressive disorder   . Allergic rhinitis due to pollen     Past Surgical History  Procedure Laterality Date  . Back surgery    . Abdominal surgery    . Hernia repair    . Joint replacement    . Total knee arthroplasty  2009    left    There were no vitals filed for this visit.      Subjective Assessment - 01/20/16 0935    Subjective Pt reports he is sore following spinal injections.    Patient Stated Goals to have less pain. To return to working out to lose weight.    Currently in Pain? Yes   Pain Score 6    Pain Location Back   Pain Orientation Lower   Pain Descriptors / Indicators Tightness;Sore                         [facility] Adult PT Treatment/Exercise - 01/20/16 0001    Lumbar Exercises: Aerobic   Stationary Bike Nustep L5 x 7 minutes   Lumbar Exercises: Standing   Row 20 reps;Theraband   Theraband Level (Row) Level 3 (Green)   Shoulder Extension 20 reps   Theraband Level (Shoulder Extension) Level 3 (Green)   Other Standing Lumbar Exercises against wall,  abdominal brace body blade lateral with BUE 3x1 min   Lumbar Exercises: Sidelying   Clam 20 reps;10 reps   Electrical Stimulation   Electrical Stimulation Location lumbar spine side lying on the R with pillow between the legs.    Electrical Stimulation Action IFC 15 min   Electrical Stimulation Parameters 15   Electrical Stimulation Goals Pain                PT Education - 01/20/16 [phone]    Education provided Yes   Education Details exercise form/rationale   Person(s) Educated Patient   Methods Explanation;Demonstration;Tactile cues;Verbal cues   Comprehension Verbalized understanding;Returned demonstration;Verbal cues required;Tactile cues required;Need further instruction          PT Short Term Goals - 01/18/16 1338    PT SHORT TERM GOAL #1   Title -Pt will increase core strength to good    Baseline improving   Time 4   Period Weeks   Status On-going   PT SHORT TERM GOAL #2   Title -Pt will be I w/ HEP for core strength and stretching  Time 4   Period Weeks   Status Achieved   PT SHORT TERM GOAL #3   Title -Pt will report centralized lumbar pain with no radiculopathy   Baseline stops above knee after doing a lot of flexion motions with exercises.    PT SHORT TERM GOAL #4   Title Pt will report 4/10 pain at worst.    Baseline 7/10 in the mornings, sometimes as low as 5/10    Time 4   Period Weeks   Status On-going           PT Long Term Goals - 01/06/16 [id number]    PT LONG TERM GOAL #1   Title Pt will sit for 1 hour without pain in order to drive in his car    Baseline 20 minutes   Time 8   Period Weeks   Status On-going   PT LONG TERM GOAL #2   Title Pt will stand for 1/2 hour in order to perform IADL's    Baseline can stand/walk 30 minutes   Time 8   Period Weeks   Status Achieved   PT LONG TERM GOAL #3   Title Pt's photo score will have a 55 % limitation    Baseline 80 % limitation    Time 8   Period Weeks   Status On-going   PT LONG TERM  GOAL #4   Title Pt return to the gym with a program that promotes core stability and avoids exercises that will irritate his back.    Time 8   Period Weeks   Status On-going               Plan - 01/20/16 [id number]    Clinical Impression Statement Avoidance of rotational motions and excessive flexion/extension due to injections yesterday. difficulty with core control during body blade challenge and required VC to breathe and maintain form.    PT Next Visit Plan  Continue HEP and progress as tolerated; continue to work on standing core/ thoracic mobility exercises (pt has h/o umbilical hernia repair and inguinal hernias)  IFC for pain management   PT Home Exercise Plan LTR, knee to chest, ab set with clam, hip flexor stretch , standing rows and extension with green band, prepilates HEP- except hip flexion, standing rotation stabilization with green band, thoracic extnsion over chair, upper trunk rotation book openings.    Consulted and Agree with Plan of Care Patient      Patient will benefit from skilled therapeutic intervention in order to improve the following deficits and impairments:     Visit Diagnosis: Bilateral low back pain with left-sided sciatica  Muscle weakness (generalized)  Difficulty in walking, not elsewhere classified     Problem List Patient Active Problem List   Diagnosis Date Noted  . Morbid obesity (HCC) 02/15/2012  . Back pain 01/04/2012  . Postlaminectomy syndrome, lumbar region 01/04/2012  . Opioid abuse, unspecified 01/04/2012  . Dyspnea 11/27/2011  . Bronchitis 09/22/2011  . Confusion 09/22/2011  . Abdominal pain 09/22/2011  . Constipation 09/22/2011  . Chronic back pain 09/22/2011  . Dehydration 09/22/2011  . Nausea & vomiting 09/22/2011    Desean Heemstra C. Lewis Keats PT, DPT 01/20/2016 10:19 AM   [facility] [address] [place], [place], [address] Phone: [phone]   Fax:   [phone]  Name: Robert [name] MRN: [id number] Date of Birth: 1955/07/18

## 2016-01-24 ENCOUNTER — Ambulatory Visit: Payer: No Typology Code available for payment source | Admitting: Physical Therapy

## 2016-01-24 DIAGNOSIS — R262 Difficulty in walking, not elsewhere classified: Secondary | ICD-10-CM

## 2016-01-24 DIAGNOSIS — M5442 Lumbago with sciatica, left side: Secondary | ICD-10-CM | POA: Diagnosis not present

## 2016-01-24 DIAGNOSIS — M6281 Muscle weakness (generalized): Secondary | ICD-10-CM | POA: Diagnosis not present

## 2016-01-24 NOTE — Therapy (Addendum)
[place], [place], [address] Phone: [phone]   Fax:  [phone]  Physical Therapy Treatment/Discharge Summary  Patient Details  Name: Robert [name] MRN: [id number] Date of Birth: June 30, 1956 Referring Provider: Dr Theadora Rama Daubert   Encounter Date: 01/24/2016      PT End of Session - 01/24/16 1106    Visit Number 16   Number of Visits 16   Date for PT Re-Evaluation 02/07/16   Authorization Type medicare    PT Start Time 1103   PT Stop Time 1205   PT Time Calculation (min) 62 min      Past Medical History  Diagnosis Date  . Arthritis   . Asthma   . Back pain   . Barrett esophagus   . Exposure to hepatitis B   . Exposure to hepatitis C   . Narcotic abuse   . OSA (obstructive sleep apnea)     on CPAP   . Depressive disorder   . Allergic rhinitis due to pollen     Past Surgical History  Procedure Laterality Date  . Back surgery    . Abdominal surgery    . Hernia repair    . Joint replacement    . Total knee arthroplasty  2009    left    There were no vitals filed for this visit.      Subjective Assessment - 01/24/16 1113    Subjective I am having increased sciatica pain, thought it might be due to the injections.    Currently in Pain? Yes   Pain Score 6    Pain Location Back   Pain Orientation Lower   Aggravating Factors  scraping the trim to prepare for pain   Pain Relieving Factors E stim            [facility] PT Assessment - 01/24/16 0001    Observation/Other Assessments   Focus on Therapeutic Outcomes (FOTO)  54 % limited (At best 50% limited) improved from 80 % limited on Eval                      [facility] Adult PT Treatment/Exercise - 01/24/16 0001    Lumbar Exercises: Stretches   Lower Trunk Rotation Limitations x10   Lumbar Exercises: Standing   Row 20 reps;Theraband   Theraband Level (Row) Level 3 (Green)   Shoulder Extension 20 reps   Theraband Level  (Shoulder Extension) Level 3 (Green)   Other Standing Lumbar Exercises Standing rotation stabilization green band in semi tandem stance x 15 each side, the D2 flexion x 10 each with green band and normal stance.    Lumbar Exercises: Seated   Long Arc Quad on Chair 20 reps   LAQ on Chair Limitations with arms at 90 degrees to engage core   Lumbar Exercises: Supine   Clam 20 reps   Clam Limitations with ab set   Lumbar Exercises: Sidelying   Clam 20 reps;10 reps   Other Sidelying Lumbar Exercises upper trunk rotation book openings x 3 each side 30 sec   Electrical Stimulation   Electrical Stimulation Location lumbar spine side lying on the R with pillow between the legs.    Electrical Stimulation Action IFC x 1 5 minutes    Electrical Stimulation Parameters 15   Electrical Stimulation Goals Pain                PT Education - 01/24/16 1204    Education provided  Yes   Education Details rotational stab and d2 flexion   Person(s) Educated Patient   Methods Explanation;Handout   Comprehension Verbalized understanding          PT Short Term Goals - 01/24/16 1117    PT SHORT TERM GOAL #1   Title -Pt will increase core strength to good    Baseline improving   Time 4   Period Weeks   Status Partially Met   PT SHORT TERM GOAL #2   Title -Pt will be I w/ HEP for core strength and stretching    Time 4   Period Weeks   Status Achieved   PT SHORT TERM GOAL #3   Title -Pt will report centralized lumbar pain with no radiculopathy   Baseline was doing better until recent exacerbation   Time 4   Period Weeks   Status Partially Met   PT SHORT TERM GOAL #4   Title Pt will report 4/10 pain at worst.    Baseline 7/10 in the mornings, sometimes as low as 5/10    Time 4   Period Weeks   Status Not Met           PT Long Term Goals - 01/24/16 1120    PT LONG TERM GOAL #1   Title Pt will sit for 1 hour without pain in order to drive in his car    Baseline 35 minutes max    Time 8   Period Weeks   Status Not Met   PT LONG TERM GOAL #2   Title Pt will stand for 1/2 hour in order to perform IADL's    Baseline can stand/walk 30 minutes   Time 8   Period Weeks   Status Achieved   PT LONG TERM GOAL #3   Title Pt's photo score will have a 55 % limitation    Baseline 50% best, 54% today   Time 8   Period Weeks   Status Partially Met   PT LONG TERM GOAL #4   Title Pt return to the gym with a program that promotes core stability and avoids exercises that will irritate his back.    Baseline Has never planned to go to gym, Has HEP   Time 8   Period Weeks   Status Deferred            G-Codes - 2016-02-02 1437    Functional Assessment Tool Used Clinical judgement (FOTO to be completed next visit)   Functional Limitation Changing and maintaining body position   Changing and Maintaining Body Position Current Status (K5397) At least 60 percent but less than 80 percent impaired, limited or restricted   Changing and Maintaining Body Position Goal Status (Q7341) At least 20 percent but less than 40 percent impaired, limited or restricted   Changing and Maintaining Body Position Discharge Status (P3790) At least 20 percent but less than 40 percent impaired, limited or restricted             Plan - 01/24/16 1126    Clinical Impression Statement Pt reports improved mobility and ease with trasfers as well as improved sitting and walking tolerance. His lumbar pain still averages 6/10 daily with decreased leg pain overall. He is having an exacerbation of sciatica today due to preparing to paint family room at home. He is independent with HEP and is prepared to discharge today. He plans to get a exercise bike for home and purchase a home TENS unit when able. See goals  met.    PT Next Visit Plan discharge   PT Home Exercise Plan LTR, knee to chest, ab set with clam, hip flexor stretch , standing rows and extension with green band, prepilates HEP- except hip flexion,  standing rotation stabilization with green band, thoracic extnsion over chair, upper trunk rotation book openings.       Patient will benefit from skilled therapeutic intervention in order to improve the following deficits and impairments:  Abnormal gait, Decreased activity tolerance, Decreased range of motion, Decreased endurance, Hypomobility, Pain, Impaired sensation, Improper body mechanics  Visit Diagnosis: Bilateral low back pain with left-sided sciatica  Muscle weakness (generalized)  Difficulty in walking, not elsewhere classified  PHYSICAL THERAPY DISCHARGE SUMMARY  Visits from Start of Care: 16  Current functional level related to goals / functional outcomes: Patient continues to have pain but he has a good program to reduce his pain. He was advised that weight loss would likely lead to improved lower back pain.    Remaining deficits: Pain in lower back   Education / Equipment: Continue with HEP  Plan: Patient agrees to discharge.  Patient goals were partially met. Patient is being discharged due to lack of progress.  ?????       Problem List Patient Active Problem List   Diagnosis Date Noted  . Morbid obesity ([place]) 02/15/2012  . Back pain 01/04/2012  . Postlaminectomy syndrome, lumbar region 01/04/2012  . Opioid abuse, unspecified 01/04/2012  . Dyspnea 11/27/2011  . Bronchitis 09/22/2011  . Confusion 09/22/2011  . Abdominal pain 09/22/2011  . Constipation 09/22/2011  . Chronic back pain 09/22/2011  . Dehydration 09/22/2011  . Nausea & vomiting 09/22/2011   Carolyne Littles PT DPT  Dorene Ar, [place] 01/24/2016, 1:11 PM  [facility] [address] [place], [place], [address] Phone: [phone]   Fax:  [phone]  Name: Bracen D [name] MRN: [id number] Date of Birth: 09-07-1955   PHYSICAL THERAPY DISCHARGE SUMMARY  Visits from Start of Care: 16  Current functional level related to goals /  functional outcomes: See above   Remaining deficits: See above   Education / Equipment: Anatomy of condition, POC, HEP, exercise form/rationale  Plan: Patient agrees to discharge.  Patient goals were partially met. Patient is being discharged due to being pleased with the current functional level.  ?????    Cobie Leidner C. Hightower PT, DPT 06/06/16 8:00 AM

## 2016-01-26 ENCOUNTER — Encounter: Payer: Medicare Other | Admitting: Physical Therapy

## 2016-02-16 DIAGNOSIS — M961 Postlaminectomy syndrome, not elsewhere classified: Secondary | ICD-10-CM | POA: Diagnosis not present

## 2016-02-16 DIAGNOSIS — Z79891 Long term (current) use of opiate analgesic: Secondary | ICD-10-CM | POA: Diagnosis not present

## 2016-02-16 DIAGNOSIS — M545 Low back pain: Secondary | ICD-10-CM | POA: Diagnosis not present

## 2016-02-16 DIAGNOSIS — M4696 Unspecified inflammatory spondylopathy, lumbar region: Secondary | ICD-10-CM | POA: Diagnosis not present

## 2016-02-16 DIAGNOSIS — Z79899 Other long term (current) drug therapy: Secondary | ICD-10-CM | POA: Diagnosis not present

## 2016-02-16 DIAGNOSIS — M5137 Other intervertebral disc degeneration, lumbosacral region: Secondary | ICD-10-CM | POA: Diagnosis not present

## 2016-02-16 DIAGNOSIS — G894 Chronic pain syndrome: Secondary | ICD-10-CM | POA: Diagnosis not present

## 2016-02-21 ENCOUNTER — Ambulatory Visit: Payer: Medicare Other | Admitting: Family Medicine

## 2016-03-02 DIAGNOSIS — M545 Low back pain: Secondary | ICD-10-CM | POA: Diagnosis not present

## 2016-03-15 ENCOUNTER — Ambulatory Visit: Payer: Medicare Other | Admitting: Family Medicine

## 2016-03-20 DIAGNOSIS — M5137 Other intervertebral disc degeneration, lumbosacral region: Secondary | ICD-10-CM | POA: Diagnosis not present

## 2016-03-20 DIAGNOSIS — M4696 Unspecified inflammatory spondylopathy, lumbar region: Secondary | ICD-10-CM | POA: Diagnosis not present

## 2016-03-20 DIAGNOSIS — M961 Postlaminectomy syndrome, not elsewhere classified: Secondary | ICD-10-CM | POA: Diagnosis not present

## 2016-03-20 DIAGNOSIS — M47817 Spondylosis without myelopathy or radiculopathy, lumbosacral region: Secondary | ICD-10-CM | POA: Diagnosis not present

## 2016-04-11 ENCOUNTER — Ambulatory Visit (INDEPENDENT_AMBULATORY_CARE_PROVIDER_SITE_OTHER): Payer: Medicare Other | Admitting: Family Medicine

## 2016-04-11 ENCOUNTER — Encounter: Payer: Self-pay | Admitting: Family Medicine

## 2016-04-11 DIAGNOSIS — Z9989 Dependence on other enabling machines and devices: Secondary | ICD-10-CM

## 2016-04-11 DIAGNOSIS — J984 Other disorders of lung: Secondary | ICD-10-CM

## 2016-04-11 DIAGNOSIS — E669 Obesity, unspecified: Secondary | ICD-10-CM

## 2016-04-11 DIAGNOSIS — R5381 Other malaise: Secondary | ICD-10-CM | POA: Diagnosis not present

## 2016-04-11 DIAGNOSIS — R600 Localized edema: Secondary | ICD-10-CM

## 2016-04-11 DIAGNOSIS — F32A Depression, unspecified: Secondary | ICD-10-CM | POA: Insufficient documentation

## 2016-04-11 DIAGNOSIS — G4733 Obstructive sleep apnea (adult) (pediatric): Secondary | ICD-10-CM | POA: Diagnosis not present

## 2016-04-11 DIAGNOSIS — F329 Major depressive disorder, single episode, unspecified: Secondary | ICD-10-CM

## 2016-04-11 DIAGNOSIS — Z1389 Encounter for screening for other disorder: Secondary | ICD-10-CM

## 2016-04-11 DIAGNOSIS — E668 Other obesity: Secondary | ICD-10-CM

## 2016-04-11 DIAGNOSIS — Z87891 Personal history of nicotine dependence: Secondary | ICD-10-CM

## 2016-04-11 NOTE — Patient Instructions (Signed)
     Mediterranean Diet  Why follow it? Research shows. . Those who follow the Mediterranean diet have a reduced risk of heart disease  . The diet is associated with a reduced incidence of Parkinson's and Alzheimer's diseases . People following the diet may have longer life expectancies and lower rates of chronic diseases  . The Dietary Guidelines for Americans recommends the Mediterranean diet as an eating plan to promote health and prevent disease  What Is the Mediterranean Diet?  . Healthy eating plan based on typical foods and recipes of Mediterranean-style cooking . The diet is primarily a plant based diet; these foods should make up a majority of meals   Starches - Plant based foods should make up a majority of meals - They are an important sources of vitamins, minerals, energy, antioxidants, and fiber - Choose whole grains, foods high in fiber and minimally processed items  - Typical grain sources include wheat, oats, barley, corn, brown rice, bulgar, farro, millet, polenta, couscous  - Various types of beans include chickpeas, lentils, fava beans, black beans, white beans   Fruits  Veggies - Large quantities of antioxidant rich fruits & veggies; 6 or more servings  - Vegetables can be eaten raw or lightly drizzled with oil and cooked  - Vegetables common to the traditional Mediterranean Diet include: artichokes, arugula, beets, broccoli, brussel sprouts, cabbage, carrots, celery, collard greens, cucumbers, eggplant, kale, leeks, lemons, lettuce, mushrooms, okra, onions, peas, peppers, potatoes, pumpkin, radishes, rutabaga, shallots, spinach, sweet potatoes, turnips, zucchini - Fruits common to the Mediterranean Diet include: apples, apricots, avocados, cherries, clementines, dates, figs, grapefruits, grapes, melons, nectarines, oranges, peaches, pears, pomegranates, strawberries, tangerines  Fats - Replace butter and margarine with healthy oils, such as olive oil, canola oil, and  tahini  - Limit nuts to no more than a handful a day  - Nuts include walnuts, almonds, pecans, pistachios, pine nuts  - Limit or avoid candied, honey roasted or heavily salted nuts - Olives are central to the Mediterranean diet - can be eaten whole or used in a variety of dishes   Meats Protein - Limiting red meat: no more than a few times a month - When eating red meat: choose lean cuts and keep the portion to the size of deck of cards - Eggs: approx. 0 to 4 times a week  - Fish and lean poultry: at least 2 a week  - Healthy protein sources include, chicken, turkey, lean beef, lamb - Increase intake of seafood such as tuna, salmon, trout, mackerel, shrimp, scallops - Avoid or limit high fat processed meats such as sausage and bacon  Dairy - Include moderate amounts of low fat dairy products  - Focus on healthy dairy such as fat free yogurt, skim milk, low or reduced fat cheese - Limit dairy products higher in fat such as whole or 2% milk, cheese, ice cream  Alcohol - Moderate amounts of red wine is ok  - No more than 5 oz daily for women (all ages) and men older than age 65  - No more than 10 oz of wine daily for men younger than 65  Other - Limit sweets and other desserts  - Use herbs and spices instead of salt to flavor foods  - Herbs and spices common to the traditional Mediterranean Diet include: basil, bay leaves, chives, cloves, cumin, fennel, garlic, lavender, marjoram, mint, oregano, parsley, pepper, rosemary, sage, savory, sumac, tarragon, thyme   It's not just a diet, it's   a lifestyle:  . The Mediterranean diet includes lifestyle factors typical of those in the region  . Foods, drinks and meals are best eaten with others and savored . Daily physical activity is important for overall good health . This could be strenuous exercise like running and aerobics . This could also be more leisurely activities such as walking, housework, yard-work, or taking the stairs . Moderation is  the key; a balanced and healthy diet accommodates most foods and drinks . Consider portion sizes and frequency of consumption of certain foods   Meal Ideas & Options:  . Breakfast:  o Whole wheat toast or whole wheat English muffins with peanut butter & hard boiled egg o Steel cut oats topped with apples & cinnamon and skim milk  o Fresh fruit: banana, strawberries, melon, berries, peaches  o Smoothies: strawberries, bananas, greek yogurt, peanut butter o Low fat greek yogurt with blueberries and granola  o Egg white omelet with spinach and mushrooms o Breakfast couscous: whole wheat couscous, apricots, skim milk, cranberries  . Sandwiches:  o Hummus and grilled vegetables (peppers, zucchini, squash) on whole wheat bread   o Grilled chicken on whole wheat pita with lettuce, tomatoes, cucumbers or tzatziki  o Tuna salad on whole wheat bread: tuna salad made with greek yogurt, olives, red peppers, capers, green onions o Garlic rosemary lamb pita: lamb sauted with garlic, rosemary, salt & pepper; add lettuce, cucumber, greek yogurt to pita - flavor with lemon juice and black pepper  . Seafood:  o Mediterranean grilled salmon, seasoned with garlic, basil, parsley, lemon juice and black pepper o Shrimp, lemon, and spinach whole-grain pasta salad made with low fat greek yogurt  o Seared scallops with lemon orzo  o Seared tuna steaks seasoned salt, pepper, coriander topped with tomato mixture of olives, tomatoes, olive oil, minced garlic, parsley, green onions and cappers  . Meats:  o Herbed greek chicken salad with kalamata olives, cucumber, feta  o Red bell peppers stuffed with spinach, bulgur, lean ground beef (or lentils) & topped with feta   o Kebabs: skewers of chicken, tomatoes, onions, zucchini, squash  o Turkey burgers: made with red onions, mint, dill, lemon juice, feta cheese topped with roasted red peppers . Vegetarian o Cucumber salad: cucumbers, artichoke hearts, celery, red  onion, feta cheese, tossed in olive oil & lemon juice  o Hummus and whole grain pita points with a greek salad (lettuce, tomato, feta, olives, cucumbers, red onion) o Lentil soup with celery, carrots made with vegetable broth, garlic, salt and pepper  o Tabouli salad: parsley, bulgur, mint, scallions, cucumbers, tomato, radishes, lemon juice, olive oil, salt and pepper.  

## 2016-04-11 NOTE — Progress Notes (Signed)
New patient office visit note:  Impression and Recommendations:    1. Morbid obesity due to excess calories (HCC)   2. Restrictive lung disease secondary to obesity   3. OSA on CPAP   4. Bilateral lower extremity pitting edema   5. Physical deconditioning   6. Screening for multiple conditions   7. History of tobacco use   8. h/o Depressive disorder      Discussed with patient connection between BMI and various medical conditions. Extensive health counseling done. Mentioned bariatric medicine referral and we will readdress next office visit as well.   Patient will follow-up with Dr. Sherene [name] regarding his restrictive lung disease and CPAP machine for his OSA.  Explained to patient bilateral lower extremity edema and skin darkening is chronic. I explained pathophysiology. Unless he loses weight and starts to exercise, and eat better, this will never go away and will worsen with time.  Recommend he walk 5 minutes several times daily as tolerated. Patient told me he cannot walk due to his terrible back pain.    All pain medicines from his Ortho and pain doctors.  Denies depression or history thereof  We'll obtain fasting blood work in the very near future and make a follow-up to discuss. Pt was in the office today for 40+ minutes, with over 50% time spent in face to face counseling of various medical concerns and in coordination of care   Orders Placed This Encounter  Procedures  . CBC with Differential/Platelet  . COMPLETE METABOLIC PANEL WITH GFR  . Hemoglobin A1c  . Lipid panel  . T4, free  . TSH  . VITAMIN D 25 Hydroxy (Vit-D Deficiency, Fractures)  . Vitamin B12    New Prescriptions   No medications on file    Modified Medications   No medications on file    Discontinued Medications   No medications on file    Return for Fasting blood work near future and then OV with me 2 wks later to discuss.  The patient was counseled, risk factors were  discussed, anticipatory guidance given.  Gross side effects, risk and benefits, and alternatives of medications discussed with patient.  Patient is aware that all medications have potential side effects and we are unable to predict every side effect or drug-drug interaction that may occur.  Expresses verbal understanding and consents to current therapy plan and treatment regimen.  Please see AVS handed out to patient at the end of our visit for further patient instructions/ counseling done pertaining to today's office visit.    Note: This document was prepared using Dragon voice recognition software and may include unintentional dictation errors.  -------------------------------------------------------------------------------------------------------------------  Subjective:    Chief Complaint  Patient presents with  . Establish Care    HPI: Robert [name] is a pleasant 60 y.o. male who presents to [facility] [address] [facility] today to review their medical history with me and establish care.   DisabledNow, Was a musician, Visual merchandiser   I asked the patient to review their chronic problem list with me to ensure everything was updated and accurate.    - Patient has not seen a primary care physician for over 5-6 years at least other than his pain doctors and/or orthopedist. Has not had screening blood work in at least that amount of time.    Robert [name] to Robert [name], 2 daughters ages 52 and 14;  and 1 son age 12 from a prior marriage  Morbidly obese:  Has been big most of his life. Everyone in his family is.  He gained about 100-125 lbs in 2006 after back and knee sx  Patient with a history of several knee surgeries- 4 then knee replacement '07, back surgery 2006.  Specialists patient is cared for by:  Dr Robert [name]- spine center in [place]- back sx.  Goes to the [facility] in [place]-  Dr  Robert [name] Readings from Last 3 Encounters:  04/11/16 (!) 354 lb 14.4 oz  (161 kg)  09/27/15 (!) 340 lb (154.2 kg)  02/15/12 (!) 325 lb (147.4 kg)   BP Readings from Last 3 Encounters:  04/11/16 134/86  09/27/15 131/81  05/31/12 (!) 107/46   Pulse Readings from Last 3 Encounters:  04/11/16 75  09/27/15 61  05/31/12 97   BMI Readings from Last 3 Encounters:  04/11/16 48.81 kg/m  09/27/15 43.07 kg/m  02/15/12 41.73 kg/m    Patient Active Problem List   Diagnosis Date Noted  . Bilateral lower extremity pitting edema 04/11/2016    Priority: High  . Restrictive lung disease secondary to obesity 04/11/2016    Priority: High  . Morbid obesity BMI >er 48 02/15/2012    Priority: High  . OSA on CPAP- seen Dr Robert [name] in past 04/11/2016    Priority: Medium  . Physical deconditioning 04/11/2016    Priority: Medium  . History of tobacco use 04/11/2016    Priority: Low  . h/o Depressive disorder 04/11/2016  . Lumbar radiculopathy 07/21/2012  . Chronic pain due to injury 07/21/2012  . Back pain 01/04/2012  . Postlaminectomy syndrome, lumbar region 01/04/2012  . h/o Narcotic abuse 01/04/2012  . Dyspnea 11/27/2011  . Confusion 09/22/2011  . Abdominal pain 09/22/2011  . Constipation 09/22/2011  . Chronic back pain 09/22/2011  . Dehydration 09/22/2011  . Nausea & vomiting 09/22/2011     Past Medical History:  Diagnosis Date  . Allergic rhinitis due to pollen   . Arthritis   . Asthma   . Back pain   . Barrett esophagus   . Depressive disorder   . Exposure to hepatitis B   . Exposure to hepatitis C   . Narcotic abuse    pain medications  . OSA (obstructive sleep apnea)    on CPAP      Past Surgical History:  Procedure Laterality Date  . ABDOMINAL SURGERY    . BACK SURGERY    . HERNIA REPAIR    . JOINT REPLACEMENT    . TOTAL KNEE ARTHROPLASTY  2009   left     Family History  Problem Relation Age of Onset  . Allergies Mother   . Early death Father   . Thyroid disease Sister      History  Drug Use No    Comment: Prior abuse of  narcotics    History  Alcohol Use No    Comment: occ    History  Smoking Status  . Former Smoker  . Packs/day: 0.80  . Years: 15.00  . Types: Cigarettes  . Quit date: 07/16/2004  Smokeless Tobacco  . Never Used    Patient's Medications  New Prescriptions   No medications on file  Previous Medications   ALBUTEROL (PROVENTIL HFA;VENTOLIN HFA) 108 (90 BASE) MCG/ACT INHALER    Inhale 2 puffs into the lungs every 6 (six) hours as needed for wheezing or shortness of breath.   B COMPLEX VITAMINS (B COMPLEX 100 PO)  Take 1 capsule by mouth daily.   CYCLOBENZAPRINE (FLEXERIL) 10 MG TABLET    Take 1 tablet by mouth 2 (two) times daily.   IBUPROFEN (ADVIL,MOTRIN) 200 MG TABLET    Take 200 mg by mouth every 6 (six) hours as needed for fever or mild pain.   TRAMADOL (ULTRAM) 50 MG TABLET    Take 50 mg by mouth 3 (three) times daily. For pain  Modified Medications   No medications on file  Discontinued Medications   No medications on file    Allergies: Review of patient's allergies indicates no known allergies.  Review of Systems  Constitutional: Negative.  Negative for chills, diaphoresis, fever, malaise/fatigue and weight loss.  HENT: Negative.  Negative for congestion, sore throat and tinnitus.   Eyes: Negative.  Negative for blurred vision, double vision and photophobia.  Respiratory: Positive for cough. Negative for wheezing.   Cardiovascular: Negative.  Negative for chest pain and palpitations.  Gastrointestinal: Negative.  Negative for blood in stool, diarrhea, nausea and vomiting.  Genitourinary: Negative.  Negative for dysuria, frequency and urgency.       Concerned with sexual function  Musculoskeletal: Positive for joint pain and myalgias.  Skin: Negative.  Negative for itching and rash.  Neurological: Negative.  Negative for dizziness, focal weakness, weakness and headaches.  Endo/Heme/Allergies: Negative.  Negative for environmental allergies and polydipsia. Does not  bruise/bleed easily.  Psychiatric/Behavioral: Negative for depression and memory loss. The patient has insomnia. The patient is not nervous/anxious.      Objective:   Blood pressure 134/86, pulse 75, height 5' 11.5" (1.816 m), weight (!) 354 lb 14.4 oz (161 kg). Body mass index is 48.81 kg/m. General: Well Developed, well nourished, and in no acute distress.  Neuro: Alert and oriented x3, extra-ocular muscles intact HEENT: Normocephalic, atraumatic, pupils equal round reactive, neck supple Skin: no gross suspicious lesions or rashes  Cardiac: Regular rate and rhythm- but very distant, no murmurs rubs or gallops.  Respiratory:   Distant but essentially clear to auscultation bilaterally. Not using accessory muscles, speaking in full sentences.  Abdominal: Soft, not grossly distended Musculoskeletal: Ambulates w/o diff, FROM * 4 ext.  Vasc: less 2 sec cap RF, warm and pink; bilateral pitting edema up to his knees. Chronic venous stasis changes bilateral lower legs Psych:   judgement and insight good, Euthymic mood. Full Affect.

## 2016-04-17 DIAGNOSIS — G894 Chronic pain syndrome: Secondary | ICD-10-CM | POA: Diagnosis not present

## 2016-04-17 DIAGNOSIS — M4696 Unspecified inflammatory spondylopathy, lumbar region: Secondary | ICD-10-CM | POA: Diagnosis not present

## 2016-04-17 DIAGNOSIS — M961 Postlaminectomy syndrome, not elsewhere classified: Secondary | ICD-10-CM | POA: Diagnosis not present

## 2016-04-17 DIAGNOSIS — M5137 Other intervertebral disc degeneration, lumbosacral region: Secondary | ICD-10-CM | POA: Diagnosis not present

## 2016-04-17 DIAGNOSIS — Z79891 Long term (current) use of opiate analgesic: Secondary | ICD-10-CM | POA: Diagnosis not present

## 2016-04-17 DIAGNOSIS — M47817 Spondylosis without myelopathy or radiculopathy, lumbosacral region: Secondary | ICD-10-CM | POA: Diagnosis not present

## 2016-04-17 DIAGNOSIS — Z79899 Other long term (current) drug therapy: Secondary | ICD-10-CM | POA: Diagnosis not present

## 2016-04-25 ENCOUNTER — Other Ambulatory Visit (INDEPENDENT_AMBULATORY_CARE_PROVIDER_SITE_OTHER): Payer: Medicare Other

## 2016-04-25 DIAGNOSIS — G4733 Obstructive sleep apnea (adult) (pediatric): Secondary | ICD-10-CM

## 2016-04-25 DIAGNOSIS — Z9989 Dependence on other enabling machines and devices: Secondary | ICD-10-CM | POA: Diagnosis not present

## 2016-04-25 DIAGNOSIS — R7303 Prediabetes: Secondary | ICD-10-CM | POA: Diagnosis not present

## 2016-04-25 DIAGNOSIS — R410 Disorientation, unspecified: Secondary | ICD-10-CM | POA: Diagnosis not present

## 2016-04-25 DIAGNOSIS — E559 Vitamin D deficiency, unspecified: Secondary | ICD-10-CM | POA: Diagnosis not present

## 2016-04-26 LAB — COMPLETE METABOLIC PANEL WITH GFR
ALK PHOS: 69 U/L (ref 40–115)
ALT: 38 U/L (ref 9–46)
AST: 21 U/L (ref 10–35)
Albumin: 4.2 g/dL (ref 3.6–5.1)
BILIRUBIN TOTAL: 0.5 mg/dL (ref 0.2–1.2)
BUN: 19 mg/dL (ref 7–25)
CALCIUM: 9.2 mg/dL (ref 8.6–10.3)
CO2: 29 mmol/L (ref 20–31)
CREATININE: 0.81 mg/dL (ref 0.70–1.25)
Chloride: 102 mmol/L (ref 98–110)
Glucose, Bld: 107 mg/dL — ABNORMAL HIGH (ref 65–99)
Potassium: 4.5 mmol/L (ref 3.5–5.3)
Sodium: 140 mmol/L (ref 135–146)
TOTAL PROTEIN: 6.4 g/dL (ref 6.1–8.1)

## 2016-04-26 LAB — CBC WITH DIFFERENTIAL/PLATELET
BASOS ABS: 0 {cells}/uL (ref 0–200)
Basophils Relative: 0 %
EOS ABS: 68 {cells}/uL (ref 15–500)
Eosinophils Relative: 1 %
HEMATOCRIT: 42.6 % (ref 38.5–50.0)
Hemoglobin: 14.4 g/dL (ref 13.2–17.1)
LYMPHS PCT: 21 %
Lymphs Abs: 1428 cells/uL (ref 850–3900)
MCH: 31.2 pg (ref 27.0–33.0)
MCHC: 33.8 g/dL (ref 32.0–36.0)
MCV: 92.2 fL (ref 80.0–100.0)
MONO ABS: 476 {cells}/uL (ref 200–950)
MONOS PCT: 7 %
MPV: 9.6 fL (ref 7.5–12.5)
Neutro Abs: 4828 cells/uL (ref 1500–7800)
Neutrophils Relative %: 71 %
PLATELETS: 196 10*3/uL (ref 140–400)
RBC: 4.62 MIL/uL (ref 4.20–5.80)
RDW: 14.4 % (ref 11.0–15.0)
WBC: 6.8 10*3/uL (ref 3.8–10.8)

## 2016-04-26 LAB — TSH: TSH: 1.42 m[IU]/L (ref 0.40–4.50)

## 2016-04-26 LAB — LIPID PANEL
CHOL/HDL RATIO: 3.1 ratio (ref <=5.0)
CHOLESTEROL: 154 mg/dL (ref 125–200)
HDL: 50 mg/dL (ref >=40)
LDL Cholesterol: 91 mg/dL (ref <130)
Triglycerides: 65 mg/dL (ref <150)
VLDL: 13 mg/dL (ref <30)

## 2016-04-26 LAB — HEMOGLOBIN A1C
HEMOGLOBIN A1C: 6.2 % — AB (ref <5.7)
MEAN PLASMA GLUCOSE: 131 mg/dL

## 2016-04-26 LAB — T4, FREE: FREE T4: 1.2 ng/dL (ref 0.8–1.8)

## 2016-04-26 LAB — VITAMIN D 25 HYDROXY (VIT D DEFICIENCY, FRACTURES): VIT D 25 HYDROXY: 12 ng/mL — AB (ref 30–100)

## 2016-04-26 LAB — VITAMIN B12: VITAMIN B 12: 279 pg/mL (ref 200–1100)

## 2016-05-08 ENCOUNTER — Encounter: Payer: Self-pay | Admitting: Family Medicine

## 2016-05-08 ENCOUNTER — Ambulatory Visit (INDEPENDENT_AMBULATORY_CARE_PROVIDER_SITE_OTHER): Payer: Medicare Other | Admitting: Family Medicine

## 2016-05-08 VITALS — BP 138/84 | HR 88 | Ht 71.5 in | Wt 351.4 lb

## 2016-05-08 DIAGNOSIS — Z23 Encounter for immunization: Secondary | ICD-10-CM | POA: Diagnosis not present

## 2016-05-08 DIAGNOSIS — I872 Venous insufficiency (chronic) (peripheral): Secondary | ICD-10-CM | POA: Insufficient documentation

## 2016-05-08 DIAGNOSIS — R7303 Prediabetes: Secondary | ICD-10-CM | POA: Diagnosis not present

## 2016-05-08 DIAGNOSIS — E559 Vitamin D deficiency, unspecified: Secondary | ICD-10-CM | POA: Insufficient documentation

## 2016-05-08 DIAGNOSIS — R5381 Other malaise: Secondary | ICD-10-CM

## 2016-05-08 MED ORDER — VITAMIN D (ERGOCALCIFEROL) 1.25 MG (50000 UNIT) PO CAPS: 50000.0000 [IU] | ORAL_CAPSULE | ORAL | 10 refills | Status: DC

## 2016-05-08 MED ORDER — VITAMIN D3 125 MCG (5000 UT) PO TABS: ORAL_TABLET | ORAL | 3 refills | Status: DC

## 2016-05-08 NOTE — Progress Notes (Signed)
Assessment and plan:  1. Prediabetes- new onset 04-2016   2. Morbid obesity BMI >er 48   3. Vitamin D deficiency   4. Physical deconditioning   5. Need for prophylactic vaccination and inoculation against influenza    - Counseled patient on pathophysiology of disease and discussed various treatment options, which often includes dietary and lifestyle modifications as first line, in addition to discussing the risks and benefits of various medications.   Please call insurance company to find out which glucometer they prefer- and please let us know. Education done and handouts provided. Obesity: Encouraged weight loss through healthy\prudent diet and moving daily. Patient will take weekly and daily doses of vitamin D. Also advised B complex vitamin daily - Anticipatory guidance given.  - Encouraged to return to clinic or call the office with any further questions or concerns.   New Prescriptions   CHOLECALCIFEROL (VITAMIN D3) 5000 UNITS TABS    5,000 IU OTC vitamin D3 daily.   VITAMIN D, ERGOCALCIFEROL, (DRISDOL) 50000 UNITS CAPS CAPSULE    Take 1 capsule (50,000 Units total) by mouth every 7 (seven) days.    Modified Medications   No medications on file    Discontinued Medications   No medications on file   Return in about 4 months (around 09/08/2016) for reck A1c next ov;   reck BP and for new onset Pre-DM.  Anticipatory guidance and routine counseling done re: condition, txmnt options and need for follow up. All questions of patient's were answered.   Gross side effects, risk and benefits, and alternatives of medications discussed with patient.  Patient is aware that all medications have potential side effects and we are unable to predict every sideeffect or drug-drug interaction that may occur.  Expresses verbal understanding and consents to current therapy plan and treatment regiment.  Please see AVS handed out  to patient at the end of our visit for additional patient instructions/ counseling done pertaining to today's office visit.  Note: This document was prepared using Dragon voice recognition software and may include unintentional dictation errors.   ----------------------------------------------------------------------------------------------------------------------  Subjective:   CC:   Robert [name] is a 60 y.o. male who presents to [facility] today for review and discussion of recent bloodwork that was done.  All recent blood work that we ordered was reviewed with patient today.  Patient was counseled on all abnormalities and we discussed dietary and lifestyle changes that could help those values (also medications when appropriate).  Extensive health counseling performed and all patient's concerns/ questions were addressed.   1. Prior PCP Dr Eilleen Kempf- in [place].  2. No h/o Pre-DM 3. Down 3lbs since last OV-little change in diet etc 4. Has fatigue-but thought was due to decompensation-maybe vit D def 5.  - Has a lot of questions about healthy diet, eating habits, exercise. He has not done any exercise since last office visit when I set a goal of 5 minutes twice daily.  Wt Readings from Last 3 Encounters:  05/08/16 (!) 351 lb 6.4 oz (159.4 kg)  04/11/16 (!) 354 lb 14.4 oz (161 kg)  09/27/15 (!) 340 lb (154.2 kg)   BP Readings from Last 3 Encounters:  05/08/16 138/84  04/11/16 134/86  09/27/15 131/81   Pulse Readings from Last 3 Encounters:  05/08/16 88  04/11/16 75  09/27/15 61   BMI Readings from Last 3 Encounters:  05/08/16 48.33 kg/m  04/11/16 48.81 kg/m  09/27/15 43.07 kg/m     Patient Care Team    Relationship Specialty Notifications Start End  [name], DO PCP - General Family Medicine  02/20/16   Artist Pais, MD  Orthopedic Surgery  04/11/16   Dian Situ, MD  Pain Medicine  04/11/16   Tanda Rockers, MD Consulting Physician  Pulmonary Disease  04/11/16    Comment: Obstructive sleep apnea, restrictive lung disease due to severe morbid obesity    Full medical history updated and reviewed in the office today  Patient Active Problem List   Diagnosis Date Noted  . Vitamin D deficiency 05/08/2016    Priority: High  . Prediabetes: new onset 04/2016 05/08/2016    Priority: High  . Bilateral lower extremity pitting edema 04/11/2016    Priority: High  . Restrictive lung disease secondary to obesity 04/11/2016    Priority: High  . Morbid obesity BMI >er 48 02/15/2012    Priority: High  . Chronic venous stasis dermatitis of both lower extremities 05/08/2016    Priority: Medium  . OSA on CPAP- seen Dr Melvyn Novas in past 04/11/2016    Priority: Medium  . Physical deconditioning 04/11/2016    Priority: Medium  . h/o Depressive disorder 04/11/2016    Priority: Medium  . History of tobacco use 04/11/2016    Priority: Low  . Postlaminectomy syndrome, lumbar region 01/04/2012    Priority: Low  . h/o Narcotic abuse 01/04/2012    Priority: Low  . Constipation 09/22/2011    Priority: Low  . Chronic back pain 09/22/2011    Priority: Low  . Lumbar radiculopathy 07/21/2012  . Chronic pain due to injury 07/21/2012  . Back pain 01/04/2012  . Dyspnea 11/27/2011  . Confusion 09/22/2011  . Abdominal pain 09/22/2011  . Dehydration 09/22/2011  . Nausea & vomiting 09/22/2011    Past Medical History:  Diagnosis Date  . Allergic rhinitis due to pollen   . Arthritis   . Asthma   . Back pain   . Barrett esophagus   . Depressive disorder   . Exposure to hepatitis B   . Exposure to hepatitis C   . Narcotic abuse    pain medications  . OSA (obstructive sleep apnea)    on CPAP     Past Surgical History:  Procedure Laterality Date  . ABDOMINAL SURGERY    . BACK SURGERY    . HERNIA REPAIR    . JOINT REPLACEMENT    . TOTAL KNEE ARTHROPLASTY  2009   left    Social History  Substance Use Topics  . Smoking status:  Former Smoker    Packs/day: 0.80    Years: 15.00    Types: Cigarettes    Quit date: 07/16/2004  . Smokeless tobacco: Never Used  . Alcohol use No     Comment: occ    Family Hx: Family History  Problem Relation Age of Onset  . Allergies Mother   . Early death Father   . Thyroid disease Sister      Medications: Current Outpatient Prescriptions  Medication Sig Dispense Refill  . albuterol (PROVENTIL HFA;VENTOLIN HFA) 108 (90 Base) MCG/ACT inhaler Inhale 2 puffs into the lungs every 6 (six) hours as needed for wheezing or shortness of breath.    . B Complex Vitamins (B COMPLEX 100 PO) Take 1 capsule by mouth daily.    . cyclobenzaprine (FLEXERIL) 10 MG tablet Take 1 tablet by mouth 2 (two) times daily.    Marland Kitchen ibuprofen (ADVIL,MOTRIN) 200  MG tablet Take 200 mg by mouth every 6 (six) hours as needed for fever or mild pain.    . traMADol (ULTRAM) 50 MG tablet Take 50 mg by mouth 3 (three) times daily. For pain    . Cholecalciferol (VITAMIN D3) 5000 units TABS 5,000 IU OTC vitamin D3 daily. 90 tablet 3  . Vitamin D, Ergocalciferol, (DRISDOL) 50000 units CAPS capsule Take 1 capsule (50,000 Units total) by mouth every 7 (seven) days. 12 capsule 10   No current facility-administered medications for this visit.     Allergies:  No Known Allergies   ROS: Review of Systems  Constitutional: Negative.  Negative for chills, diaphoresis, fever, malaise/fatigue and weight loss.  HENT: Negative.  Negative for congestion, sore throat and tinnitus.   Eyes: Negative.  Negative for blurred vision, double vision and photophobia.  Respiratory: Negative.  Negative for cough and wheezing.   Cardiovascular: Negative.  Negative for chest pain and palpitations.  Gastrointestinal: Negative.  Negative for blood in stool, diarrhea, nausea and vomiting.  Genitourinary: Negative.  Negative for dysuria, frequency and urgency.  Musculoskeletal: Negative.  Negative for joint pain and myalgias.  Skin: Negative.   Negative for itching and rash.  Neurological: Negative.  Negative for dizziness, focal weakness, weakness and headaches.  Endo/Heme/Allergies: Negative.  Negative for environmental allergies and polydipsia. Does not bruise/bleed easily.  Psychiatric/Behavioral: Negative.  Negative for depression and memory loss. The patient is not nervous/anxious and does not have insomnia.     Objective:  Blood pressure 138/84, pulse 88, height 5' 11.5" (1.816 m), weight (!) 351 lb 6.4 oz (159.4 kg). Body mass index is 48.33 kg/m. Gen:   Well NAD, A and O *3 HEENT:    [place]/AT, EOMI,  MMM, OP- clr Lungs:   Normal work of breathing. CTA B/L, no Wh, rhonchi Heart:   RRR, S1, S2 WNL's, no MRG Abd:   Obese Exts:     Bilateral 3+ pitting edema and chronic venous stasis dermatitis Psych:    No HI/SI, judgement and insight good, Euthymic mood. Full Affect.   Recent Results (from the past 2160 hour(s))  CBC with Differential/Platelet     Status: None   Collection Time: 04/25/16  8:46 AM  Result Value Ref Range   WBC 6.8 3.8 - 10.8 K/uL   RBC 4.62 4.20 - 5.80 MIL/uL   Hemoglobin 14.4 13.2 - 17.1 g/dL   HCT 42.6 38.5 - 50.0 %   MCV 92.2 80.0 - 100.0 fL   MCH 31.2 27.0 - 33.0 pg   MCHC 33.8 32.0 - 36.0 g/dL   RDW 14.4 11.0 - 15.0 %   Platelets 196 140 - 400 K/uL   MPV 9.6 7.5 - 12.5 fL   Neutro Abs 4,828 1,500 - 7,800 cells/uL   Lymphs Abs 1,428 850 - 3,900 cells/uL   Monocytes Absolute 476 200 - 950 cells/uL   Eosinophils Absolute 68 15 - 500 cells/uL   Basophils Absolute 0 0 - 200 cells/uL   Neutrophils Relative % 71 %   Lymphocytes Relative 21 %   Monocytes Relative 7 %   Eosinophils Relative 1 %   Basophils Relative 0 %   Smear Review Criteria for review not met   COMPLETE METABOLIC PANEL WITH GFR     Status: Abnormal   Collection Time: 04/25/16  8:46 AM  Result Value Ref Range   Sodium 140 135 - 146 mmol/L   Potassium 4.5 3.5 - 5.3 mmol/L   Chloride 102 98 - 110  mmol/L   CO2 29 20 - 31  mmol/L   Glucose, Bld 107 (H) 65 - 99 mg/dL   BUN 19 7 - 25 mg/dL   Creat 0.81 0.70 - 1.25 mg/dL    Comment:   For patients > or = 62 years of age: The upper reference limit for Creatinine is approximately 13% higher for people identified as African-American.      Total Bilirubin 0.5 0.2 - 1.2 mg/dL   Alkaline Phosphatase 69 40 - 115 U/L   AST 21 10 - 35 U/L   ALT 38 9 - 46 U/L   Total Protein 6.4 6.1 - 8.1 g/dL   Albumin 4.2 3.6 - 5.1 g/dL   Calcium 9.2 8.6 - 10.3 mg/dL   GFR, Est African American >89 >=60 mL/min   GFR, Est Non African American >89 >=60 mL/min  Hemoglobin A1c     Status: Abnormal   Collection Time: 04/25/16  8:46 AM  Result Value Ref Range   Hgb A1c MFr Bld 6.2 (H) <5.7 %    Comment:   For someone without known diabetes, a hemoglobin A1c value between 5.7% and 6.4% is consistent with prediabetes and should be confirmed with a follow-up test.   For someone with known diabetes, a value <7% indicates that their diabetes is well controlled. A1c targets should be individualized based on duration of diabetes, age, co-morbid conditions and other considerations.   This assay result is consistent with an increased risk of diabetes.   Currently, no consensus exists regarding use of hemoglobin A1c for diagnosis of diabetes in children.      Mean Plasma Glucose 131 mg/dL  Lipid panel     Status: None   Collection Time: 04/25/16  8:46 AM  Result Value Ref Range   Cholesterol 154 125 - 200 mg/dL   Triglycerides 65 <150 mg/dL   HDL 50 >=40 mg/dL   Total CHOL/HDL Ratio 3.1 <=5.0 Ratio   VLDL 13 <30 mg/dL   LDL Cholesterol 91 <130 mg/dL    Comment:   Total Cholesterol/HDL Ratio:CHD Risk                        Coronary Heart Disease Risk Table                                        Men       Women          1/2 Average Risk              3.4        3.3              Average Risk              5.0        4.4           2X Average Risk              9.6        7.1            3X Average Risk             23.4       11.0 Use the calculated Patient Ratio above and the CHD Risk table  to determine the patient's CHD Risk.   T4, free     Status: None  Collection Time: 04/25/16  8:46 AM  Result Value Ref Range   Free T4 1.2 0.8 - 1.8 ng/dL  TSH     Status: None   Collection Time: 04/25/16  8:46 AM  Result Value Ref Range   TSH 1.42 0.40 - 4.50 mIU/L  VITAMIN D 25 Hydroxy (Vit-D Deficiency, Fractures)     Status: Abnormal   Collection Time: 04/25/16  8:46 AM  Result Value Ref Range   Vit D, 25-Hydroxy 12 (L) 30 - 100 ng/mL    Comment: Vitamin D Status           25-OH Vitamin D        Deficiency                <20 ng/mL        Insufficiency         20 - 29 ng/mL        Optimal             > or = 30 ng/mL   For 25-OH Vitamin D testing on patients on D2-supplementation and patients for whom quantitation of D2 and D3 fractions is required, the [facility] 25-OH VIT D, (D2,D3), LC/MS/MS is recommended: order code [id number] (patients > 2 yrs).   Vitamin B12     Status: None   Collection Time: 04/25/16  8:46 AM  Result Value Ref Range   Vitamin B-12 279 200 - 1,100 pg/mL

## 2016-05-08 NOTE — Patient Instructions (Addendum)
Please let us know if you would like a glucometer   also please check your blood pressure at home or at [facility] or pharmacies or even the apartment they can check your blood pressure for you. If it remains above the goal of 140/90 or less then please come in sooner than planned.   Prediabetes Eating Plan Prediabetes--also called impaired glucose tolerance or impaired fasting glucose--is a condition that causes blood sugar (blood glucose) levels to be higher than normal. Following a healthy diet can help to keep prediabetes under control. It can also help to lower the risk of type 2 diabetes and heart disease, which are increased in people who have prediabetes. Along with regular exercise, a healthy diet:  Promotes weight loss.  Helps to control blood sugar levels.  Helps to improve the way that the body uses insulin. WHAT DO I NEED TO KNOW ABOUT THIS EATING PLAN?  Use the glycemic index (GI) to plan your meals. The index tells you how quickly a food will raise your blood sugar. Choose low-GI foods. These foods take a longer time to raise blood sugar.  Pay close attention to the amount of carbohydrates in the food that you eat. Carbohydrates increase blood sugar levels.  Keep track of how many calories you take in. Eating the right amount of calories will help you to achieve a healthy weight. Losing about 7 percent of your starting weight can help to prevent type 2 diabetes.  You may want to follow a Mediterranean diet. This diet includes a lot of vegetables, lean meats or fish, whole grains, fruits, and healthy oils and fats. WHAT FOODS CAN I EAT? Grains Whole grains, such as whole-wheat or whole-grain breads, crackers, cereals, and pasta. Unsweetened oatmeal. Bulgur. Barley. Quinoa. Brown rice. Corn or whole-wheat flour tortillas or taco shells. Vegetables Lettuce. Spinach. Peas. Beets. Cauliflower. Cabbage. Broccoli. Carrots. Tomatoes. Squash. Eggplant. Herbs. Peppers. Onions. Cucumbers.  Brussels sprouts. Fruits Berries. Bananas. Apples. Oranges. Grapes. Papaya. Mango. Pomegranate. Kiwi. Grapefruit. Cherries. Meats and Other Protein Sources Seafood. Lean meats, such as chicken and [place] or lean cuts of pork and beef. Tofu. Eggs. Nuts. Beans. Dairy Low-fat or fat-free dairy products, such as yogurt, cottage cheese, and cheese. Beverages Water. Tea. Coffee. Sugar-free or diet soda. Seltzer water. Milk. Milk alternatives, such as soy or almond milk. Condiments Mustard. Relish. Low-fat, low-sugar ketchup. Low-fat, low-sugar barbecue sauce. Low-fat or fat-free mayonnaise. Sweets and Desserts Sugar-free or low-fat pudding. Sugar-free or low-fat ice cream and other frozen treats. Fats and Oils Avocado. Walnuts. Olive oil. The items listed above may not be a complete list of recommended foods or beverages. Contact your dietitian for more options.  WHAT FOODS ARE NOT RECOMMENDED? Grains Refined white flour and flour products, such as bread, pasta, snack foods, and cereals. Beverages Sweetened drinks, such as sweet iced tea and soda. Sweets and Desserts Baked goods, such as cake, cupcakes, pastries, cookies, and cheesecake. The items listed above may not be a complete list of foods and beverages to avoid. Contact your dietitian for more information.   This information is not intended to replace advice given to you by your health care provider. Make sure you discuss any questions you have with your health care provider.   Document Released: 11/16/2014 Document Reviewed: 11/16/2014 Elsevier Interactive Patient Education 2016 [employer].     Risk factors for prediabetes and type 2 diabetes  Researchers don't fully understand why some people develop prediabetes and type 2 diabetes and others don't.  It's clear  that certain factors increase the risk, however, including:  Weight. The more fatty tissue you have, the more resistant your cells become to insulin.  Inactivity.  The less active you are, the greater your risk. Physical activity helps you control your weight, uses up glucose as energy and makes your cells more sensitive to insulin.  Family history. Your risk increases if a parent or sibling has type 2 diabetes.  Race. Although it's unclear why, people of certain races - including blacks, Hispanics, American Indians and Asian-Americans - are at higher risk.  Age. Your risk increases as you get older. This may be because you tend to exercise less, lose muscle mass and gain weight as you age. But type 2 diabetes is also increasing dramatically among children, adolescents and younger adults.  Gestational diabetes. If you developed gestational diabetes when you were pregnant, your risk of developing prediabetes and type 2 diabetes later increases. If you gave birth to a baby weighing more than 9 pounds (4 kilograms), you're also at risk of type 2 diabetes.  Polycystic ovary syndrome. For women, having polycystic ovary syndrome - a common condition characterized by irregular menstrual periods, excess hair growth and obesity - increases the risk of diabetes.  High blood pressure. Having blood pressure over 140/90 millimeters of mercury (mm Hg) is linked to an increased risk of type 2 diabetes.  Abnormal cholesterol and triglyceride levels. If you have low levels of high-density lipoprotein (HDL), or "good," cholesterol, your risk of type 2 diabetes is higher. Triglycerides are another type of fat carried in the blood. People with high levels of triglycerides have an increased risk of type 2 diabetes. Your doctor can let you know what your cholesterol and triglyceride levels are.    A good guide to good carbs: The glycemic index ---If you have diabetes, or at risk for diabetes, you know all too well that when you eat carbohydrates, your blood sugar goes up. The total amount of carbs you consume at a meal or in a snack mostly determines what your blood sugar will do. But  the food itself also plays a role. A serving of white rice has almost the same effect as eating pure table sugar - a quick, high spike in blood sugar. A serving of lentils has a slower, smaller effect.  ---Picking good sources of carbs can help you control your blood sugar and your weight. Even if you don't have diabetes, eating healthier carbohydrate-rich foods can help ward off a host of chronic conditions, from heart disease to various cancers to, well, diabetes.  ---One way to choose foods is with the glycemic index (GI). This tool measures how much a food boosts blood sugar.  The glycemic index rates the effect of a specific amount of a food on blood sugar compared with the same amount of pure glucose. A food with a glycemic index of 28 boosts blood sugar only 28% as much as pure glucose. One with a GI of 95 acts like pure glucose.  High glycemic foods result in a quick spike in insulin and blood sugar (also known as blood glucose).  Low glycemic foods have a slower, smaller effect- these are healthier for you.   Using the glycemic index Using the glycemic index is easy: choose foods in the low GI category instead of those in the high GI category (see below), and go easy on those in between. Low glycemic index (GI of 55 or less): Most fruits and vegetables, beans, minimally processed grains,  pasta, low-fat dairy foods, and nuts.  Moderate glycemic index (GI 56 to 69): White and sweet potatoes, corn, white rice, couscous, breakfast cereals such as Cream of Wheat and Mini Wheats.  High glycemic index (GI of 70 or higher): White bread, rice cakes, most crackers, bagels, cakes, doughnuts, croissants, most packaged breakfast cereals. You can see the values for 100 commons foods and get links to more at www.health.RecordDebt.hu.  Swaps for lowering glycemic index  Instead of this high-glycemic index food Eat this lower-glycemic index food  White rice Brown rice or converted rice  Instant  oatmeal Steel-cut oats  Cornflakes Bran flakes  Baked potato Pasta, bulgur  White bread Whole-grain bread  Corn Peas or leafy greens     Please realize, EXERCISE IS MEDICINE!  -  American Heart Association ( AHA) guidelines for exercise : If you are in good health, without any medical conditions, you should engage in 150 minutes of moderate intensity aerobic activity per week.  This means you should be huffing and puffing throughout your workout.   Engaging in regular exercise will improve brain function and memory, as well as improve mood, boost immune system and help with weight management.  As well as the other, more well-known effects of exercise such as decreasing blood sugar levels, decreasing blood pressure,  and decreasing bad cholesterol levels/ increasing good cholesterol levels.     -  The AHA strongly endorses consumption of a diet that contains a variety of foods from all the food categories with an emphasis on fruits and vegetables; fat-free and low-fat dairy products; cereal and grain products; legumes and nuts; and fish, poultry, and/or extra lean meats.    Excessive food intake, especially of foods high in saturated and trans fats, sugar, and salt, should be avoided.    Adequate water intake of roughly 1/2 of your weight in pounds, should equal the ounces of water per day you should drink.  So for instance, if you're 200 pounds, that would be 100 ounces of water per day.         Mediterranean Diet  Why follow it? Research shows. . Those who follow the Mediterranean diet have a reduced risk of heart disease  . The diet is associated with a reduced incidence of Parkinson's and Alzheimer's diseases . People following the diet may have longer life expectancies and lower rates of chronic diseases  . The Dietary Guidelines for Americans recommends the Mediterranean diet as an eating plan to promote health and prevent disease  What Is the Mediterranean Diet?  . Healthy eating  plan based on typical foods and recipes of Mediterranean-style cooking . The diet is primarily a plant based diet; these foods should make up a majority of meals   Starches - Plant based foods should make up a majority of meals - They are an important sources of vitamins, minerals, energy, antioxidants, and fiber - Choose whole grains, foods high in fiber and minimally processed items  - Typical grain sources include wheat, oats, barley, corn, brown rice, bulgar, farro, millet, polenta, couscous  - Various types of beans include chickpeas, lentils, fava beans, black beans, white beans   Fruits  Veggies - Large quantities of antioxidant rich fruits & veggies; 6 or more servings  - Vegetables can be eaten raw or lightly drizzled with oil and cooked  - Vegetables common to the traditional Mediterranean Diet include: artichokes, arugula, beets, broccoli, brussel sprouts, cabbage, carrots, celery, collard greens, cucumbers, eggplant, kale, leeks, lemons, lettuce, mushrooms,  okra, onions, peas, peppers, potatoes, pumpkin, radishes, rutabaga, shallots, spinach, sweet potatoes, turnips, zucchini - Fruits common to the Mediterranean Diet include: apples, apricots, avocados, cherries, clementines, dates, figs, grapefruits, grapes, melons, nectarines, oranges, peaches, pears, pomegranates, strawberries, tangerines  Fats - Replace butter and margarine with healthy oils, such as olive oil, canola oil, and tahini  - Limit nuts to no more than a handful a day  - Nuts include walnuts, almonds, pecans, pistachios, pine nuts  - Limit or avoid candied, honey roasted or heavily salted nuts - Olives are central to the [employer] - can be eaten whole or used in a variety of dishes   Meats Protein - Limiting red meat: no more than a few times a month - When eating red meat: choose lean cuts and keep the portion to the size of deck of cards - Eggs: approx. 0 to 4 times a week  - Fish and lean poultry: at least 2  a week  - Healthy protein sources include, chicken, [place], lean beef, lamb - Increase intake of seafood such as tuna, salmon, trout, mackerel, shrimp, scallops - Avoid or limit high fat processed meats such as sausage and bacon  Dairy - Include moderate amounts of low fat dairy products  - Focus on healthy dairy such as fat free yogurt, skim milk, low or reduced fat cheese - Limit dairy products higher in fat such as whole or 2% milk, cheese, ice cream  Alcohol - Moderate amounts of red wine is ok  - No more than 5 oz daily for women (all ages) and men older than age 41  - No more than 10 oz of wine daily for men younger than 79  Other - Limit sweets and other desserts  - Use herbs and spices instead of salt to flavor foods  - Herbs and spices common to the traditional Mediterranean Diet include: basil, bay leaves, chives, cloves, cumin, fennel, garlic, lavender, marjoram, mint, oregano, parsley, pepper, rosemary, sage, savory, sumac, tarragon, thyme   It's not just a diet, it's a lifestyle:  . The Mediterranean diet includes lifestyle factors typical of those in the region  . Foods, drinks and meals are best eaten with others and savored . Daily physical activity is important for overall good health . This could be strenuous exercise like running and aerobics . This could also be more leisurely activities such as walking, housework, yard-work, or taking the stairs . Moderation is the key; a balanced and healthy diet accommodates most foods and drinks . Consider portion sizes and frequency of consumption of certain foods   Meal Ideas & Options:  . Breakfast:  o Whole wheat toast or whole wheat English muffins with peanut butter & hard boiled egg o Steel cut oats topped with apples & cinnamon and skim milk  o Fresh fruit: banana, strawberries, melon, berries, peaches  o Smoothies: strawberries, bananas, greek yogurt, peanut butter o Low fat greek yogurt with blueberries and granola   o Egg white omelet with spinach and mushrooms o Breakfast couscous: whole wheat couscous, apricots, skim milk, cranberries  . Sandwiches:  o Hummus and grilled vegetables (peppers, zucchini, squash) on whole wheat bread   o Grilled chicken on whole wheat pita with lettuce, tomatoes, cucumbers or tzatziki  o Tuna salad on whole wheat bread: tuna salad made with greek yogurt, olives, red peppers, capers, green onions o Garlic rosemary lamb pita: lamb sauted with garlic, rosemary, salt & pepper; add lettuce, cucumber, greek yogurt to [place] -  flavor with lemon juice and black pepper  . Seafood:  o Mediterranean grilled salmon, seasoned with garlic, basil, parsley, lemon juice and black pepper o Shrimp, lemon, and spinach whole-grain pasta salad made with low fat greek yogurt  o Seared scallops with lemon orzo  o Seared tuna steaks seasoned salt, pepper, coriander topped with tomato mixture of olives, tomatoes, olive oil, minced garlic, parsley, green onions and cappers  . Meats:  o Herbed greek chicken salad with kalamata olives, cucumber, feta  o Red bell peppers stuffed with spinach, bulgur, lean ground beef (or lentils) & topped with feta   o Kebabs: skewers of chicken, tomatoes, onions, zucchini, squash  o [place] burgers: made with red onions, mint, dill, lemon juice, feta cheese topped with roasted red peppers . Vegetarian o Cucumber salad: cucumbers, artichoke hearts, celery, red onion, feta cheese, tossed in olive oil & lemon juice  o Hummus and whole grain pita points with a greek salad (lettuce, tomato, feta, olives, cucumbers, red onion) o Lentil soup with celery, carrots made with vegetable broth, garlic, salt and pepper  o Tabouli salad: parsley, bulgur, mint, scallions, cucumbers, tomato, radishes, lemon juice, olive oil, salt and pepper.

## 2016-05-21 DIAGNOSIS — Z79899 Other long term (current) drug therapy: Secondary | ICD-10-CM | POA: Diagnosis not present

## 2016-05-21 DIAGNOSIS — M47817 Spondylosis without myelopathy or radiculopathy, lumbosacral region: Secondary | ICD-10-CM | POA: Diagnosis not present

## 2016-05-21 DIAGNOSIS — M961 Postlaminectomy syndrome, not elsewhere classified: Secondary | ICD-10-CM | POA: Diagnosis not present

## 2016-05-21 DIAGNOSIS — Z79891 Long term (current) use of opiate analgesic: Secondary | ICD-10-CM | POA: Diagnosis not present

## 2016-05-21 DIAGNOSIS — G894 Chronic pain syndrome: Secondary | ICD-10-CM | POA: Diagnosis not present

## 2016-05-21 DIAGNOSIS — M5137 Other intervertebral disc degeneration, lumbosacral region: Secondary | ICD-10-CM | POA: Diagnosis not present

## 2016-05-21 DIAGNOSIS — M4696 Unspecified inflammatory spondylopathy, lumbar region: Secondary | ICD-10-CM | POA: Diagnosis not present

## 2016-06-13 ENCOUNTER — Ambulatory Visit: Payer: Medicare Other | Admitting: Family Medicine

## 2016-06-18 DIAGNOSIS — Z79899 Other long term (current) drug therapy: Secondary | ICD-10-CM | POA: Diagnosis not present

## 2016-06-18 DIAGNOSIS — G894 Chronic pain syndrome: Secondary | ICD-10-CM | POA: Diagnosis not present

## 2016-06-18 DIAGNOSIS — M961 Postlaminectomy syndrome, not elsewhere classified: Secondary | ICD-10-CM | POA: Diagnosis not present

## 2016-06-18 DIAGNOSIS — M47817 Spondylosis without myelopathy or radiculopathy, lumbosacral region: Secondary | ICD-10-CM | POA: Diagnosis not present

## 2016-06-18 DIAGNOSIS — Z79891 Long term (current) use of opiate analgesic: Secondary | ICD-10-CM | POA: Diagnosis not present

## 2016-06-18 DIAGNOSIS — M5137 Other intervertebral disc degeneration, lumbosacral region: Secondary | ICD-10-CM | POA: Diagnosis not present

## 2016-07-02 ENCOUNTER — Ambulatory Visit: Payer: Medicare Other | Admitting: Family Medicine

## 2016-07-17 DIAGNOSIS — G894 Chronic pain syndrome: Secondary | ICD-10-CM | POA: Diagnosis not present

## 2016-07-17 DIAGNOSIS — M47816 Spondylosis without myelopathy or radiculopathy, lumbar region: Secondary | ICD-10-CM | POA: Diagnosis not present

## 2016-07-17 DIAGNOSIS — M545 Low back pain: Secondary | ICD-10-CM | POA: Diagnosis not present

## 2016-07-17 DIAGNOSIS — M961 Postlaminectomy syndrome, not elsewhere classified: Secondary | ICD-10-CM | POA: Diagnosis not present

## 2016-07-17 DIAGNOSIS — M5137 Other intervertebral disc degeneration, lumbosacral region: Secondary | ICD-10-CM | POA: Diagnosis not present

## 2016-07-17 DIAGNOSIS — Z79899 Other long term (current) drug therapy: Secondary | ICD-10-CM | POA: Diagnosis not present

## 2016-07-17 DIAGNOSIS — Z79891 Long term (current) use of opiate analgesic: Secondary | ICD-10-CM | POA: Diagnosis not present

## 2016-08-20 DIAGNOSIS — Z79899 Other long term (current) drug therapy: Secondary | ICD-10-CM | POA: Diagnosis not present

## 2016-08-20 DIAGNOSIS — M47816 Spondylosis without myelopathy or radiculopathy, lumbar region: Secondary | ICD-10-CM | POA: Diagnosis not present

## 2016-08-20 DIAGNOSIS — Z79891 Long term (current) use of opiate analgesic: Secondary | ICD-10-CM | POA: Diagnosis not present

## 2016-08-20 DIAGNOSIS — G894 Chronic pain syndrome: Secondary | ICD-10-CM | POA: Diagnosis not present

## 2016-08-20 DIAGNOSIS — M545 Low back pain: Secondary | ICD-10-CM | POA: Diagnosis not present

## 2016-09-04 ENCOUNTER — Ambulatory Visit: Payer: Medicare Other | Admitting: Family Medicine

## 2016-09-17 DIAGNOSIS — Z79891 Long term (current) use of opiate analgesic: Secondary | ICD-10-CM | POA: Diagnosis not present

## 2016-09-17 DIAGNOSIS — G894 Chronic pain syndrome: Secondary | ICD-10-CM | POA: Diagnosis not present

## 2016-09-17 DIAGNOSIS — M5137 Other intervertebral disc degeneration, lumbosacral region: Secondary | ICD-10-CM | POA: Diagnosis not present

## 2016-09-17 DIAGNOSIS — Z79899 Other long term (current) drug therapy: Secondary | ICD-10-CM | POA: Diagnosis not present

## 2016-09-17 DIAGNOSIS — M961 Postlaminectomy syndrome, not elsewhere classified: Secondary | ICD-10-CM | POA: Diagnosis not present

## 2016-09-17 DIAGNOSIS — M47817 Spondylosis without myelopathy or radiculopathy, lumbosacral region: Secondary | ICD-10-CM | POA: Diagnosis not present

## 2016-09-21 ENCOUNTER — Ambulatory Visit: Payer: Medicare Other | Admitting: Family Medicine

## 2016-10-17 DIAGNOSIS — G894 Chronic pain syndrome: Secondary | ICD-10-CM | POA: Diagnosis not present

## 2016-10-17 DIAGNOSIS — M961 Postlaminectomy syndrome, not elsewhere classified: Secondary | ICD-10-CM | POA: Diagnosis not present

## 2016-10-17 DIAGNOSIS — M545 Low back pain: Secondary | ICD-10-CM | POA: Diagnosis not present

## 2016-10-17 DIAGNOSIS — Z79891 Long term (current) use of opiate analgesic: Secondary | ICD-10-CM | POA: Diagnosis not present

## 2016-10-17 DIAGNOSIS — M47816 Spondylosis without myelopathy or radiculopathy, lumbar region: Secondary | ICD-10-CM | POA: Diagnosis not present

## 2016-10-17 DIAGNOSIS — Z79899 Other long term (current) drug therapy: Secondary | ICD-10-CM | POA: Diagnosis not present

## 2016-11-01 ENCOUNTER — Ambulatory Visit: Payer: Medicare Other | Admitting: Family Medicine

## 2016-11-15 DIAGNOSIS — M47816 Spondylosis without myelopathy or radiculopathy, lumbar region: Secondary | ICD-10-CM | POA: Diagnosis not present

## 2016-11-15 DIAGNOSIS — Z79899 Other long term (current) drug therapy: Secondary | ICD-10-CM | POA: Diagnosis not present

## 2016-11-15 DIAGNOSIS — M545 Low back pain: Secondary | ICD-10-CM | POA: Diagnosis not present

## 2016-11-15 DIAGNOSIS — G894 Chronic pain syndrome: Secondary | ICD-10-CM | POA: Diagnosis not present

## 2016-11-15 DIAGNOSIS — Z79891 Long term (current) use of opiate analgesic: Secondary | ICD-10-CM | POA: Diagnosis not present

## 2016-11-22 ENCOUNTER — Other Ambulatory Visit: Payer: Self-pay | Admitting: Pain Medicine

## 2016-11-22 DIAGNOSIS — M545 Low back pain: Secondary | ICD-10-CM

## 2016-11-27 ENCOUNTER — Encounter: Payer: Self-pay | Admitting: Family Medicine

## 2016-11-27 ENCOUNTER — Ambulatory Visit (INDEPENDENT_AMBULATORY_CARE_PROVIDER_SITE_OTHER): Payer: Medicare Other | Admitting: Family Medicine

## 2016-11-27 VITALS — BP 138/87 | HR 67 | Ht 71.5 in | Wt 354.5 lb

## 2016-11-27 DIAGNOSIS — R7303 Prediabetes: Secondary | ICD-10-CM

## 2016-11-27 DIAGNOSIS — E559 Vitamin D deficiency, unspecified: Secondary | ICD-10-CM

## 2016-11-27 DIAGNOSIS — G8929 Other chronic pain: Secondary | ICD-10-CM | POA: Diagnosis not present

## 2016-11-27 DIAGNOSIS — F329 Major depressive disorder, single episode, unspecified: Secondary | ICD-10-CM

## 2016-11-27 DIAGNOSIS — M79672 Pain in left foot: Secondary | ICD-10-CM | POA: Diagnosis not present

## 2016-11-27 DIAGNOSIS — M545 Low back pain: Secondary | ICD-10-CM

## 2016-11-27 DIAGNOSIS — F32A Depression, unspecified: Secondary | ICD-10-CM

## 2016-11-27 DIAGNOSIS — R600 Localized edema: Secondary | ICD-10-CM

## 2016-11-27 DIAGNOSIS — Z1159 Encounter for screening for other viral diseases: Secondary | ICD-10-CM | POA: Diagnosis not present

## 2016-11-27 LAB — POCT GLYCOSYLATED HEMOGLOBIN (HGB A1C): Hemoglobin A1C: 6.3

## 2016-11-27 NOTE — Assessment & Plan Note (Signed)
Pain mgt- per Pain Doc

## 2016-11-27 NOTE — Patient Instructions (Addendum)
Ice 15-20 min every hour even if painful  Wear supportive shoes; elevate feet as well   - please call us if you have not heard anythign about the nutritional referral in one week- Robert [name]  - call your insurance co and ask them exactly which BS monitor is cheapest for you and call tonya with that info and we will send script to pharmacy.  Check fasting BS and 2 hr PP.  Risk factors for prediabetes and type 2 diabetes  Researchers don't fully understand why some people develop prediabetes and type 2 diabetes and others don't.  It's clear that certain factors increase the risk, however, including:  Weight. The more fatty tissue you have, the more resistant your cells become to insulin.  Inactivity. The less active you are, the greater your risk. Physical activity helps you control your weight, uses up glucose as energy and makes your cells more sensitive to insulin.  Family history. Your risk increases if a parent or sibling has type 2 diabetes.  Race. Although it's unclear why, people of certain races - including blacks, Hispanics, American Indians and Asian-Americans - are at higher risk.  Age. Your risk increases as you get older. This may be because you tend to exercise less, lose muscle mass and gain weight as you age. But type 2 diabetes is also increasing dramatically among children, adolescents and younger adults.  Gestational diabetes. If you developed gestational diabetes when you were pregnant, your risk of developing prediabetes and type 2 diabetes later increases. If you gave birth to a baby weighing more than 9 pounds (4 kilograms), you're also at risk of type 2 diabetes.  Polycystic ovary syndrome. For women, having polycystic ovary syndrome - a common condition characterized by irregular menstrual periods, excess hair growth and obesity - increases the risk of diabetes.  High blood pressure. Having blood pressure over 140/90 millimeters of mercury (mm Hg) is linked to an increased risk  of type 2 diabetes.  Abnormal cholesterol and triglyceride levels. If you have low levels of high-density lipoprotein (HDL), or "good," cholesterol, your risk of type 2 diabetes is higher. Triglycerides are another type of fat carried in the blood. People with high levels of triglycerides have an increased risk of type 2 diabetes. Your doctor can let you know what your cholesterol and triglyceride levels are.  A good guide to good carbs: The glycemic index ---If you have diabetes, or at risk for diabetes, you know all too well that when you eat carbohydrates, your blood sugar goes up. The total amount of carbs you consume at a meal or in a snack mostly determines what your blood sugar will do. But the food itself also plays a role. A serving of white rice has almost the same effect as eating pure table sugar - a quick, high spike in blood sugar. A serving of lentils has a slower, smaller effect.  ---Picking good sources of carbs can help you control your blood sugar and your weight. Even if you don't have diabetes, eating healthier carbohydrate-rich foods can help ward off a host of chronic conditions, from heart disease to various cancers to, well, diabetes.  ---One way to choose foods is with the glycemic index (GI). This tool measures how much a food boosts blood sugar.  The glycemic index rates the effect of a specific amount of a food on blood sugar compared with the same amount of pure glucose. A food with a glycemic index of 28 boosts blood sugar only 28%  as much as pure glucose. One with a GI of 95 acts like pure glucose.    High glycemic foods result in a quick spike in insulin and blood sugar (also known as blood glucose).  Low glycemic foods have a slower, smaller effect- these are healthier for you.   Using the glycemic index Using the glycemic index is easy: choose foods in the low GI category instead of those in the high GI category (see below), and go easy on those in between. Low  glycemic index (GI of 55 or less): Most fruits and vegetables, beans, minimally processed grains, pasta, low-fat dairy foods, and nuts.  Moderate glycemic index (GI 56 to 69): White and sweet potatoes, corn, white rice, couscous, breakfast cereals such as Cream of Wheat and Mini Wheats.  High glycemic index (GI of 70 or higher): White bread, rice cakes, most crackers, bagels, cakes, doughnuts, croissants, most packaged breakfast cereals. You can see the values for 100 commons foods and get links to more at www.health.RecordDebt.hu.  Swaps for lowering glycemic index  Instead of this high-glycemic index food Eat this lower-glycemic index food  White rice Brown rice or converted rice  Instant oatmeal Steel-cut oats  Cornflakes Bran flakes  Baked potato Pasta, bulgur  White bread Whole-grain bread  Corn Peas or leafy greens       Prediabetes Eating Plan  Prediabetes--also called impaired glucose tolerance or impaired fasting glucose--is a condition that causes blood sugar (blood glucose) levels to be higher than normal. Following a healthy diet can help to keep prediabetes under control. It can also help to lower the risk of type 2 diabetes and heart disease, which are increased in people who have prediabetes. Along with regular exercise, a healthy diet:  Promotes weight loss.  Helps to control blood sugar levels.  Helps to improve the way that the body uses insulin.   WHAT DO I NEED TO KNOW ABOUT THIS EATING PLAN?   Use the glycemic index (GI) to plan your meals. The index tells you how quickly a food will raise your blood sugar. Choose low-GI foods. These foods take a longer time to raise blood sugar.  Pay close attention to the amount of carbohydrates in the food that you eat. Carbohydrates increase blood sugar levels.  Keep track of how many calories you take in. Eating the right amount of calories will help you to achieve a healthy weight. Losing about 7 percent of your  starting weight can help to prevent type 2 diabetes.  You may want to follow a Mediterranean diet. This diet includes a lot of vegetables, lean meats or fish, whole grains, fruits, and healthy oils and fats.   WHAT FOODS CAN I EAT?  Grains Whole grains, such as whole-wheat or whole-grain breads, crackers, cereals, and pasta. Unsweetened oatmeal. Bulgur. Barley. Quinoa. Brown rice. Corn or whole-wheat flour tortillas or taco shells. Vegetables Lettuce. Spinach. Peas. Beets. Cauliflower. Cabbage. Broccoli. Carrots. Tomatoes. Squash. Eggplant. Herbs. Peppers. Onions. Cucumbers. Brussels sprouts. Fruits Berries. Bananas. Apples. Oranges. Grapes. Papaya. Mango. Pomegranate. Kiwi. Grapefruit. Cherries. Meats and Other Protein Sources Seafood. Lean meats, such as chicken and [place] or lean cuts of pork and beef. Tofu. Eggs. Nuts. Beans. Dairy Low-fat or fat-free dairy products, such as yogurt, cottage cheese, and cheese. Beverages Water. Tea. Coffee. Sugar-free or diet soda. Seltzer water. Milk. Milk alternatives, such as soy or almond milk. Condiments Mustard. Relish. Low-fat, low-sugar ketchup. Low-fat, low-sugar barbecue sauce. Low-fat or fat-free mayonnaise. Sweets and Desserts Sugar-free or low-fat  pudding. Sugar-free or low-fat ice cream and other frozen treats. Fats and Oils Avocado. Walnuts. Olive oil. The items listed above may not be a complete list of recommended foods or beverages. Contact your dietitian for more options.    WHAT FOODS ARE NOT RECOMMENDED?  Grains Refined white flour and flour products, such as bread, pasta, snack foods, and cereals. Beverages Sweetened drinks, such as sweet iced tea and soda. Sweets and Desserts Baked goods, such as cake, cupcakes, pastries, cookies, and cheesecake. The items listed above may not be a complete list of foods and beverages to avoid. Contact your dietitian for more information.   This information is not intended to replace  advice given to you by your health care provider. Make sure you discuss any questions you have with your health care provider.   Document Released: 11/16/2014 Document Reviewed: 11/16/2014 Elsevier Interactive Patient Education [employer]

## 2016-11-27 NOTE — Assessment & Plan Note (Addendum)
Worse now---> needs diet and lifestyle mod.   Referral ot DM educator- early intevention needed

## 2016-11-27 NOTE — Assessment & Plan Note (Signed)
wlaking on track- that is forgiving is rec daily   Elevated feet o/w  Declines TED hose--> would not be able to put them on  Control salt intake  Drink more water

## 2016-11-27 NOTE — Assessment & Plan Note (Addendum)
Likley due to ATFL sprain and bone contusion- may  take months to heal due to being overwt.   Ice 15-20 min every hour if painful  Wear supportive shoes- not sandals but something with ankle support;   elevate feet as well.  Avoid aggrivating activities

## 2016-11-27 NOTE — Progress Notes (Signed)
Impression and Recommendations:    1. Prediabetes: new onset 04/2016   2. Morbid obesity BMI >er 48   3. Vitamin D deficiency   4. Bilateral lower extremity pitting edema   5. Chronic pain of left heel- 2 mo   6. Need for hepatitis C screening test   7. h/o Depressive disorder   8. Chronic low back pain, unspecified back pain laterality, with sciatica presence unspecified     Bilateral lower extremity pitting edema wlaking on track- that is forgiving is rec daily   Elevated feet o/w  Declines TED hose--> would not be able to put them on  Control salt intake  Drink more water  Morbid obesity BMI >er 48 Nutrition counseling referral made;  Declines bariatric medicine referral after our discusion  improtance of wt loss d/c pt or else future may  be bleak due to medical conditions being poorly contorlled and DM  Prediabetes: new onset 04/2016 Worse now---> needs diet and lifestyle mod.   Referral ot DM educator- early intevention needed  Vitamin D deficiency Will check levels with next Bldwrk draw   h/o Depressive disorder Mood- stable; denies issue/ concern  Chronic back pain Pain mgt- per Pain Doc  Chronic pain of left heel Likley due to ATFL sprain and bone contusion- may  take months to heal due to being overwt.   Ice 15-20 min every hour if painful  Wear supportive shoes- not sandals but something with ankle support;   elevate feet as well.  Avoid aggrivating activities   The patient was counseled, risk factors were discussed, anticipatory guidance given.   Orders Placed This Encounter  Procedures  . VITAMIN D 25 Hydroxy (Vit-D Deficiency, Fractures)  . Hepatitis C Antibody  . Ambulatory referral to diabetic education  . POCT glycosylated hemoglobin (Hb A1C)     Gross side effects, risk and benefits, and alternatives of medications and treatment plan in general discussed with patient.  Patient is aware that all medications have  potential side effects and we are unable to predict every side effect or drug-drug interaction that may occur.   Patient will call with any questions prior to using medication if they have concerns.  Expresses verbal understanding and consents to current therapy and treatment regimen.  No barriers to understanding were identified.  Red flag symptoms and signs discussed in detail.  Patient expressed understanding regarding what to do in case of emergency\urgent symptoms  Please see AVS handed out to patient at the end of our visit for further patient instructions/ counseling done pertaining to today's office visit.   Return in about 4 months (around 03/30/2017) for pre-DM, wt loss or sooner if needed.     Note: This document was prepared using Dragon voice recognition software and may include unintentional dictation errors.  Thomasene Lot 4:42 PM --------------------------------------------------------------------------------------------------------------------------------------------------------------------------------------------------------------------------------------------    Subjective:    CC:  Chief Complaint  Patient presents with  . Hypertension  . Vitamin D deficiency  . Hyperglycemia    HPI: Robert [name] is a 61 y.o. male who presents to [facility] today for issues as discussed below.   Wt: Lost 8 lbs according to last pain ov.  Eating less- smaller portions and if not hungry, not eating.  No exercise due to back pain- not walking even  Pre-Dm:  Was 6.2 - now 6.3.  Not charting meals/ tracking foods.  Has not lost wt according to our scales- gained.  L heel pain:   Couple months now - injured c mo ago.  Now slowly gettign better, was doing steps (stepping up onto a step)- 25 steps on one foot and 25 steps on other foot--> and he missed a step and came down harder on his L foot/heel one time.  Then pain started 1-2 days after that  incident.   Mostly heel and medial side of foot.  Been sore since.  No txmnt tried.  Wearing sandals/ poorly supportive shoes today   Wt Readings from Last 3 Encounters:  11/27/16 (!) 354 lb 8 oz (160.8 kg)  05/08/16 (!) 351 lb 6.4 oz (159.4 kg)  04/11/16 (!) 354 lb 14.4 oz (161 kg)   BP Readings from Last 3 Encounters:  11/27/16 138/87  05/08/16 138/84  04/11/16 134/86   Pulse Readings from Last 3 Encounters:  11/27/16 67  05/08/16 88  04/11/16 75   BMI Readings from Last 3 Encounters:  11/27/16 48.75 kg/m  05/08/16 48.33 kg/m  04/11/16 48.81 kg/m     Patient Care Team    Relationship Specialty Notifications Start End  Thomasene Lot, DO PCP - General Family Medicine  02/20/16   Rachelle Hora, MD  Orthopedic Surgery  04/11/16   Ardell Isaacs, MD  Pain Medicine  04/11/16   Nyoka Cowden, MD Consulting Physician Pulmonary Disease  04/11/16    Comment: Obstructive sleep apnea, restrictive lung disease due to severe morbid obesity     Patient Active Problem List   Diagnosis Date Noted  . Vitamin D deficiency 05/08/2016    Priority: High  . Prediabetes: new onset 04/2016 05/08/2016    Priority: High  . Bilateral lower extremity pitting edema 04/11/2016    Priority: High  . Restrictive lung disease secondary to obesity 04/11/2016    Priority: High  . Morbid obesity BMI >er 48 02/15/2012    Priority: High  . Chronic venous stasis dermatitis of both lower extremities 05/08/2016    Priority: Medium  . OSA on CPAP- seen Dr Sherene Sires in past 04/11/2016    Priority: Medium  . Physical deconditioning 04/11/2016    Priority: Medium  . h/o Depressive disorder 04/11/2016    Priority: Medium  . History of tobacco use 04/11/2016    Priority: Low  . Postlaminectomy syndrome, lumbar region 01/04/2012    Priority: Low  . h/o Narcotic abuse 01/04/2012    Priority: Low  . Constipation 09/22/2011    Priority: Low  . Chronic back pain 09/22/2011    Priority: Low  . Chronic  pain of left heel 11/27/2016  . Lumbar radiculopathy 07/21/2012  . Chronic pain due to injury 07/21/2012  . Back pain 01/04/2012  . Dyspnea 11/27/2011  . Confusion 09/22/2011  . Abdominal pain 09/22/2011  . Dehydration 09/22/2011  . Nausea & vomiting 09/22/2011    Past Medical history, Surgical history, Family history, Social history, Allergies and Medications have been entered into the medical record, reviewed and changed as needed.    Current Meds  Medication Sig  . albuterol (PROVENTIL HFA;VENTOLIN HFA) 108 (90 Base) MCG/ACT inhaler Inhale 2 puffs into the lungs every 6 (six) hours as needed for wheezing or shortness of breath.  . B Complex Vitamins (B COMPLEX 100 PO) Take 1 capsule by mouth daily.  . Cholecalciferol (VITAMIN D3) 5000 units TABS 5,000 IU OTC vitamin D3 daily.  . cyclobenzaprine (FLEXERIL) 10 MG tablet Take 1 tablet by mouth 2 (two) times daily.  Marland Kitchen ibuprofen (ADVIL,MOTRIN) 200 MG tablet Take  200 mg by mouth every 6 (six) hours as needed for fever or mild pain.  Marland Kitchen. Potassium 75 MG TABS Take 1 tablet by mouth daily at 8 pm.  . traMADol (ULTRAM) 50 MG tablet Take 50 mg by mouth 3 (three) times daily. For pain  . Vitamin D, Ergocalciferol, (DRISDOL) 50000 units CAPS capsule Take 1 capsule (50,000 Units total) by mouth every 7 (seven) days.  Marland Kitchen. zinc gluconate 50 MG tablet Take 50 mg by mouth daily.    Allergies:  No Known Allergies   Review of Systems: General:   Denies fever, chills, unexplained weight loss.  Optho/Auditory:   Denies visual changes, blurred vision/LOV Respiratory:   Denies wheeze, DOE more than baseline levels.  Cardiovascular:   Denies chest pain, palpitations, new onset peripheral edema  Gastrointestinal:   Denies nausea, vomiting, diarrhea, abd pain.  Genitourinary: Denies dysuria, freq/ urgency, flank pain or discharge from genitals.  Endocrine:     Denies hot or cold intolerance, polyuria, polydipsia. Musculoskeletal:   Denies unexplained  myalgias, unexplained arthralgias, gait problems.  Skin:  Denies new onset rash, suspicious lesions Neurological:     Denies dizziness, unexplained weakness, numbness  Psychiatric/Behavioral:   Denies mood changes, suicidal or homicidal ideations, hallucinations    Objective:   Blood pressure 138/87, pulse 67, height 5' 11.5" (1.816 m), weight (!) 354 lb 8 oz (160.8 kg). Body mass index is 48.75 kg/m. General:  Well Developed, well nourished, appropriate for stated age.  Neuro:  Alert and oriented,  extra-ocular muscles intact  HEENT:  Normocephalic, atraumatic, neck supple, no carotid bruits appreciated  Skin:  no gross rash, warm, pink. Cardiac:  RRR, S1 S2 Respiratory:  ECTA B/L and A/P, Not using accessory muscles, speaking in full sentences- unlabored. Foot:  Callused feet b/l and diffuse.  Edematous b/l with 3+ pitting edema.  TT Deep Palp over posterior medial aspect calcaneus and ATFL region. No boney ttp, neg drawers, some pain with inversion/eversion mvmnts of heel.  Cap RF less than 2 sec. Vascular:  Ext warm, no cyanosis apprec.; cap RF less 2 sec. Psych:  No HI/SI, judgement and insight good, Euthymic mood. Full Affect.

## 2016-11-27 NOTE — Assessment & Plan Note (Signed)
[facility]

## 2016-11-27 NOTE — Assessment & Plan Note (Addendum)
Nutrition counseling referral made;  Declines bariatric medicine referral after our discusion  improtance of wt loss d/c pt or else future may  be bleak due to medical conditions being poorly contorlled and DM

## 2016-11-27 NOTE — Assessment & Plan Note (Signed)
Mood- stable; denies issue/ concern

## 2016-12-07 ENCOUNTER — Telehealth: Payer: Self-pay

## 2016-12-07 DIAGNOSIS — K2270 Barrett's esophagus without dysplasia: Secondary | ICD-10-CM

## 2016-12-07 NOTE — Telephone Encounter (Signed)
Spoke with patient regarding records from [facility].  Patients says he never had follow up EGD or Colonoscopy.  Records reviewed and scanned into chart.  Referral to Gastroenterology for follow up made.  Patient aware.  He also denies any use of PPI.

## 2016-12-12 ENCOUNTER — Encounter: Payer: Self-pay | Admitting: Internal Medicine

## 2016-12-17 DIAGNOSIS — Z79891 Long term (current) use of opiate analgesic: Secondary | ICD-10-CM | POA: Diagnosis not present

## 2016-12-17 DIAGNOSIS — Z79899 Other long term (current) drug therapy: Secondary | ICD-10-CM | POA: Diagnosis not present

## 2016-12-17 DIAGNOSIS — G894 Chronic pain syndrome: Secondary | ICD-10-CM | POA: Diagnosis not present

## 2016-12-17 DIAGNOSIS — M545 Low back pain: Secondary | ICD-10-CM | POA: Diagnosis not present

## 2016-12-17 DIAGNOSIS — M47816 Spondylosis without myelopathy or radiculopathy, lumbar region: Secondary | ICD-10-CM | POA: Diagnosis not present

## 2016-12-29 ENCOUNTER — Other Ambulatory Visit: Payer: Medicare Other

## 2016-12-31 ENCOUNTER — Ambulatory Visit
Admission: RE | Admit: 2016-12-31 | Discharge: 2016-12-31 | Disposition: A | Discharge status: Home / Self Care | Payer: Medicare Other | Source: Ambulatory Visit | Attending: Pain Medicine | Admitting: Pain Medicine

## 2016-12-31 DIAGNOSIS — M5126 Other intervertebral disc displacement, lumbar region: Secondary | ICD-10-CM | POA: Diagnosis not present

## 2016-12-31 DIAGNOSIS — M545 Low back pain: Secondary | ICD-10-CM

## 2017-01-17 DIAGNOSIS — M79606 Pain in leg, unspecified: Secondary | ICD-10-CM | POA: Diagnosis not present

## 2017-01-17 DIAGNOSIS — M961 Postlaminectomy syndrome, not elsewhere classified: Secondary | ICD-10-CM | POA: Diagnosis not present

## 2017-01-17 DIAGNOSIS — G894 Chronic pain syndrome: Secondary | ICD-10-CM | POA: Diagnosis not present

## 2017-01-17 DIAGNOSIS — Z79899 Other long term (current) drug therapy: Secondary | ICD-10-CM | POA: Diagnosis not present

## 2017-01-17 DIAGNOSIS — Z79891 Long term (current) use of opiate analgesic: Secondary | ICD-10-CM | POA: Diagnosis not present

## 2017-01-17 DIAGNOSIS — M545 Low back pain: Secondary | ICD-10-CM | POA: Diagnosis not present

## 2017-01-21 ENCOUNTER — Encounter: Payer: Self-pay | Admitting: Internal Medicine

## 2017-01-21 ENCOUNTER — Ambulatory Visit (INDEPENDENT_AMBULATORY_CARE_PROVIDER_SITE_OTHER): Payer: Medicare Other | Admitting: Internal Medicine

## 2017-01-21 VITALS — BP 126/74 | HR 76 | Ht 71.0 in | Wt 345.0 lb

## 2017-01-21 DIAGNOSIS — Z86010 Personal history of colonic polyps: Secondary | ICD-10-CM | POA: Diagnosis not present

## 2017-01-21 DIAGNOSIS — K2270 Barrett's esophagus without dysplasia: Secondary | ICD-10-CM

## 2017-01-21 NOTE — Patient Instructions (Addendum)
  We will obtain your records from [facility] for review and we will contact you with results and plans.   Normal BMI (Body Mass Index- based on height and weight) is between 19 and 25. Your BMI today is Body mass index is 48.12 kg/m. Marland Kitchen. Please consider follow up  regarding your BMI with your Primary Care Provider.    I appreciate the opportunity to care for you. Stan Headarl Gessner, MD, [facility]

## 2017-01-21 NOTE — Progress Notes (Addendum)
Robert [name] 61 y.o. 02-09-1956 [id number]  Assessment & Plan:   Encounter Diagnoses  Name Primary?  . Barrett's esophagus without dysplasia Yes  . Hx of adenomatous polyp of colon   . Morbid obesity (HCC)     Patient clearly has a history of Barrett's esophagus I do not know the length of the segment. He has never had dysplasia, fortunately. He does not have active reflux symptoms. He is not on a PPI but current guidelines indicate that is optional and I would agree as it is not entirely clear why people get Barrett's though we do think reflux is related were not 100% certain.  I will review his endoscopy reports and determine when he should have another surveillance endoscopy it might be 5 years from the last one. That would be 2019.  He has a history of a small adenomatous polyp in 2006, he thinks he had another colonoscopy, I don't have that report, apparently no polyp then. Thus he could wait 10 years from his last colonoscopy that is the case. I will sort that out and let him know.  Note that his weight is 345 pounds which is close to her maximum stretcher limit of 350 pounds for our endoscopy center. He has lost about 20 pounds in the last several months to a year and is congratulated on this and he is working on eating differently.  I appreciate the opportunity to care for this patient. CC: Thomasene Lot, DO  Records review after this visit:  He has a 3 cm length of Barrett's esophagus reported in 2014. Intestinal metaplasia consistent with Barrett's esophagus noted. No dysplasia. It is a reasonable time to repeat a surveillance upper endoscopy. 3-5 years is the recommendation for that. His last colonoscopy was in 2010 so he should not need a repeat colonoscopy until 2020. As no polyp on the 2010 exam after a diminutive adenoma in 2006.   Subjective:   Chief Complaint:Barrett's esophagus and history of colon polyps  HPI The patient is a very nice 61 year old married  white man, disabled professional guitarist, with a long history of Barrett's esophagus previously followed by Dr. Matthias Hughs as well as a history of a small adenomatous colon polyp in 2006. Barrett's esophagus was diagnosed then also. I have pathology reports available that show Barrett's esophagus i.e. intestinal metaplasia without dysplasia in the distal esophagus in 2006, 2010 2013 and 2014. In 2013 there was some atypia indeterminate for dysplasia so he had a follow-up in 2014 without dysplasia. He has no significant heartburn symptoms. He says that those disappeared when he drastically reduced his coffee consumption though he still drinks about 6 caffeinated beverages a day he used to drink 2 pots a day.  He is trying to lose weight with intent. He's had back surgeries and knee surgery and immobility has led to weight gain he tells me. There are no other active GI symptoms. No Known Allergies Current Meds  Medication Sig  . albuterol (PROVENTIL HFA;VENTOLIN HFA) 108 (90 Base) MCG/ACT inhaler Inhale 2 puffs into the lungs every 6 (six) hours as needed for wheezing or shortness of breath.  . APPLE CIDER VINEGAR PO Take by mouth.  . B Complex Vitamins (B COMPLEX 100 PO) Take 1 capsule by mouth daily.  . Cholecalciferol (VITAMIN D3) 5000 units TABS 5,000 IU OTC vitamin D3 daily.  . cyclobenzaprine (FLEXERIL) 10 MG tablet Take 1 tablet by mouth 2 (two) times daily.  Marland Kitchen ibuprofen (ADVIL,MOTRIN) 200 MG tablet Take 200  mg by mouth every 6 (six) hours as needed for fever or mild pain.  . Misc Natural Products (GINSENG COMPLEX PO) Take by mouth.  . Potassium 75 MG TABS Take 1 tablet by mouth daily at 8 pm.  . traMADol (ULTRAM) 50 MG tablet Take 50 mg by mouth 3 (three) times daily. For pain  . Vitamin D, Ergocalciferol, (DRISDOL) 50000 units CAPS capsule Take 1 capsule (50,000 Units total) by mouth every 7 (seven) days.  Marland Kitchen. zinc gluconate 50 MG tablet Take 50 mg by mouth daily.   Past Medical History:    Diagnosis Date  . Allergic rhinitis due to pollen   . Arthritis   . Asthma   . Back pain   . Barrett esophagus   . Depressive disorder   . Exposure to hepatitis B   . Exposure to hepatitis C   . Narcotic abuse    pain medications  . OSA (obstructive sleep apnea)    on CPAP    Past Surgical History:  Procedure Laterality Date  . ABDOMINAL SURGERY    . BACK SURGERY     Rods and screws in lumbar area  . COLONOSCOPY    . ESOPHAGOGASTRODUODENOSCOPY  multiple  . HERNIA REPAIR     4  . JOINT REPLACEMENT    . TOTAL KNEE ARTHROPLASTY  2009   left   Social History   Social History  . Marital status: Married    Spouse name: N/A  . Number of children: 2  . Years of education: N/A   Occupational History  . Musician, Disabled    Social History Main Topics  . Smoking status: Former Smoker    Packs/day: 0.80    Years: 15.00    Types: Cigarettes    Quit date: 07/16/2004  . Smokeless tobacco: Never Used  . Alcohol use No  . Drug use: No     Comment: Prior abuse of narcotics  . Sexual activity: No    Social History Narrative   Married, 2 daughters born 2003 and 2004. 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 [place]   6 caffeinated beverages daily.   01/21/2017      family history includes Allergies in his mother; Early death in his father; Thyroid disease in his sister.   Review of Systems  Positive for insomnia and back pain which is chronic. He uses a back brace and walks with a cane. All other review of systems are negative. Objective:   Physical Exam @BP  126/74   Pulse 76   Ht 5\' 11"  (1.803 m)   Wt (!) 345 lb (156.5 kg)   BMI 48.12 kg/m @  General:  Well-developed, well-nourished and in no acute distress - obese Eyes:  anicteric. ENT:   Mouth and posterior pharynx free of lesions.  Neck:   supple w/o thyromegaly or mass.  Lungs: Clear to auscultation bilaterally. Heart:  S1S2, no rubs,  murmurs, gallops. Abdomen:  obese soft, non-tender, no hepatosplenomegaly, hernia, or mass and BS+. There are surgical scars. Lymph:  no cervical or supraclavicular adenopathy. Extremities:   Trace LE edema, cyanosis or clubbing, some small varicosities LE's Skin   cat scratches Neuro:  A&O x 3.  Psych:  appropriate mood and  Affect.   Data Reviewed:  Endoscopy information pathology as per history of present illness. Normal CBC October 2017 Primary care visit May 2018

## 2017-02-04 ENCOUNTER — Telehealth: Payer: Self-pay

## 2017-02-04 NOTE — Telephone Encounter (Signed)
-----   Message from Iva Booparl E Gessner, MD sent at 02/01/2017  5:12 PM EDT ----- Regarding: Next procedures He needs a colonoscopy recall for October 2020.  He should have a repeat EGD to follow-up Barrett's esophagus any time he is ready. He is close to the weight limit but not above so [facility]

## 2017-02-04 NOTE — Telephone Encounter (Signed)
Left Robert [name] a message to call us so I can inform him what Dr Leone PayorGessner advises. Colon recall put into system.

## 2017-02-07 NOTE — Telephone Encounter (Signed)
  [facility] called back and we looked at the open dates and he needs to check with his wife and call back to set up the EGD and pre-visit.  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

## 2017-02-11 ENCOUNTER — Encounter: Payer: Self-pay | Admitting: Family Medicine

## 2017-02-11 ENCOUNTER — Ambulatory Visit (INDEPENDENT_AMBULATORY_CARE_PROVIDER_SITE_OTHER): Payer: Medicare Other | Admitting: Family Medicine

## 2017-02-11 VITALS — BP 140/84 | HR 89 | Ht 71.0 in | Wt 341.0 lb

## 2017-02-11 DIAGNOSIS — H9190 Unspecified hearing loss, unspecified ear: Secondary | ICD-10-CM

## 2017-02-11 DIAGNOSIS — W57XXXA Bitten or stung by nonvenomous insect and other nonvenomous arthropods, initial encounter: Secondary | ICD-10-CM

## 2017-02-11 NOTE — Progress Notes (Signed)
Pt here for an acute care OV today   Impression and Recommendations:    1. Tick bite, initial encounter    Ehrlichiosis, and lymes disease, [place] spotted fever labs drawn today- see orders. - Since tick was on body less than 24 hours, we will hold off on treatment until labs returned.  Did discuss chance of false positives with patient.  He will let us know if he develops any other symptoms.  No problem-specific Assessment & Plan notes found for this encounter.   The patient was counseled, risk factors were discussed, anticipatory guidance given.  New Prescriptions   No medications on file    Discontinued Medications   No medications on file     Orders Placed This Encounter  Procedures  . [place] spotted fvr abs pnl(IgG+IgM)  . Lyme Disease, IgM, Early Test w/ Rflx     Gross side effects, risk and benefits, and alternatives of medications and treatment plan in general discussed with patient.  Patient is aware that all medications have potential side effects and we are unable to predict every side effect or drug-drug interaction that may occur.   Patient will call with any questions prior to using medication if they have concerns.  Expresses verbal understanding and consents to current therapy and treatment regimen.  No barriers to understanding were identified.  Red flag symptoms and signs discussed in detail.  Patient expressed understanding regarding what to do in case of emergency\urgent symptoms  Please see AVS handed out to patient at the end of our visit for further patient instructions/ counseling done pertaining to today's office visit.   No Follow-up on file.     Note: This document was prepared using Dragon voice recognition software and may include unintentional dictation errors.  Thomasene Loteborah Kanden Carey 5:02  PM --------------------------------------------------------------------------------------------------------------------------------------------------------------------------------------------------------------------------------------------    Subjective:    CC:  Chief Complaint  Patient presents with  . Tick Removal    6 wks ago right lower leg    HPI: Robert [name] is a 61 y.o. male who [facility] for issues as discussed below.  -  That day was walking in yard- later than day found tick on R lower ext. Pulled off with tweasers- bled.  About 3 wks later--> felt more achy than usual.  C/o fatigued more than usual and HA.   No Fevers.  No rash. --->   Been more active than usual lately- pulling wallpaper off wall, more late nights than usual with work/ music stuff.    Hearing affected- hears a high pitched sound constantly- for sev yrs- can't recall when he didn't hear that.  Had a hearing test- 3-4 yrs ago in [place]  No problems updated.   Wt Readings from Last 3 Encounters:  02/11/17 (!) 341 lb (154.7 kg)  01/21/17 (!) 345 lb (156.5 kg)  11/27/16 (!) 354 lb 8 oz (160.8 kg)   BP Readings from Last 3 Encounters:  02/11/17 140/84  01/21/17 126/74  11/27/16 138/87   BMI Readings from Last 3 Encounters:  02/11/17 47.56 kg/m  01/21/17 48.12 kg/m  11/27/16 48.75 kg/m     Patient Care Team    Relationship Specialty Notifications Start End  Thomasene Lotpalski, Navneet Schmuck, DO PCP - General Family Medicine  02/20/16   Rachelle Horaaubert, Harlan B, MD  Orthopedic Surgery  04/11/16   Ardell IsaacsSpivey, David, MD  Pain Medicine  04/11/16   Nyoka CowdenWert, Michael B, MD Consulting Physician Pulmonary Disease  04/11/16    Comment: Obstructive sleep apnea, restrictive lung disease due to severe morbid obesity     Patient Active Problem List   Diagnosis Date Noted  . Vitamin D deficiency 05/08/2016    Priority: High  . Prediabetes: new onset 04/2016 05/08/2016    Priority: High  .  Bilateral lower extremity pitting edema 04/11/2016    Priority: High  . Restrictive lung disease secondary to obesity 04/11/2016    Priority: High  . Morbid obesity BMI >er 48 02/15/2012    Priority: High  . Chronic venous stasis dermatitis of both lower extremities 05/08/2016    Priority: Medium  . OSA on CPAP- seen Dr Sherene SiresWert in past 04/11/2016    Priority: Medium  . Physical deconditioning 04/11/2016    Priority: Medium  . h/o Depressive disorder 04/11/2016    Priority: Medium  . History of tobacco use 04/11/2016    Priority: Low  . Postlaminectomy syndrome, lumbar region 01/04/2012    Priority: Low  . h/o Narcotic abuse 01/04/2012    Priority: Low  . Constipation 09/22/2011    Priority: Low  . Chronic back pain 09/22/2011    Priority: Low  . Barrett's esophagus 12/07/2016  . Chronic pain of left heel 11/27/2016  . Lumbar radiculopathy 07/21/2012  . Chronic pain due to injury 07/21/2012  . Back pain 01/04/2012  . Dyspnea 11/27/2011  . Confusion 09/22/2011  . Abdominal pain 09/22/2011  . Dehydration 09/22/2011  . Nausea & vomiting 09/22/2011    Past Medical history, Surgical history, Family history, Social history, Allergies and Medications have been entered into the medical record, reviewed and changed as needed.    Current Meds  Medication Sig  . albuterol (PROVENTIL HFA;VENTOLIN HFA) 108 (90 Base) MCG/ACT inhaler Inhale 2 puffs into the lungs every 6 (six) hours as needed for wheezing or shortness of breath.  . APPLE CIDER VINEGAR PO Take by mouth.  . B Complex Vitamins (B COMPLEX 100 PO) Take 1 capsule by mouth daily.  . Cholecalciferol (VITAMIN D3) 5000 units TABS 5,000 IU OTC vitamin D3 daily.  . cyclobenzaprine (FLEXERIL) 10 MG tablet Take 1 tablet by mouth 2 (two) times daily.  Marland Kitchen. ibuprofen (ADVIL,MOTRIN) 200 MG tablet Take 200 mg by mouth every 6 (six) hours as needed for fever or mild pain.  . Misc Natural Products (GINSENG COMPLEX PO) Take by mouth.  .  Potassium 75 MG TABS Take 1 tablet by mouth daily at 8 pm.  . traMADol (ULTRAM) 50 MG tablet Take 50 mg by mouth 3 (three) times daily. For pain  . Vitamin D, Ergocalciferol, (DRISDOL) 50000 units CAPS capsule Take 1 capsule (50,000 Units total) by mouth every 7 (seven) days.  Marland Kitchen. zinc gluconate 50 MG tablet Take 50 mg by mouth daily.    Allergies:  No Known Allergies   Review of Systems: General:   Denies fever, chills, unexplained weight loss.  Optho/Auditory:   Denies visual changes, blurred vision/LOV Respiratory:   Denies wheeze, DOE more than baseline levels.  Cardiovascular:   Denies chest pain, palpitations, new onset peripheral edema  Gastrointestinal:   Denies nausea, vomiting, diarrhea, abd pain.  Genitourinary: Denies dysuria, freq/ urgency, flank pain or discharge from genitals.  Endocrine:     Denies hot or cold intolerance, polyuria, polydipsia. Musculoskeletal:   Denies unexplained myalgias, joint swelling, unexplained arthralgias, gait problems.  Skin:  Denies new onset rash, suspicious lesions Neurological:     Denies dizziness, unexplained weakness, numbness  Psychiatric/Behavioral:   Denies  mood changes, suicidal or homicidal ideations, hallucinations    Objective:   Blood pressure 140/84, pulse 89, height 5\' 11"  (1.803 m), weight (!) 341 lb (154.7 kg). Body mass index is 47.56 kg/m. General:  Well Developed, well nourished, appropriate for stated age.  Neuro:  Alert and oriented,  extra-ocular muscles intact  HEENT:  Normocephalic, atraumatic, neck supple Skin:  no gross rash, warm, pink. Cardiac:  RRR, S1 S2 Respiratory:  ECTA B/L and A/P, Not using accessory muscles, speaking in full sentences- unlabored. Vascular:  Ext warm, no cyanosis apprec.; cap RF less 2 sec. Psych:  No HI/SI, judgement and insight good, Euthymic mood. Full Affect.

## 2017-02-11 NOTE — Patient Instructions (Addendum)
Since tick was on body less than 24 hours, we will hold off on treatment until labs returned.  Did discuss chance of false positives with patient.  He will let us know if he develops any other symptoms.   Tick Bite Information  Introduction Ticks are insects that attach themselves to the skin. There are many types of ticks. Common types include wood ticks and deer ticks. Sometimes, ticks carry diseases that can make a person very ill. The most common places for ticks to attach themselves are the scalp, neck, armpits, waist, and groin. HOW CAN YOU PREVENT TICK BITES? Take these steps to help prevent tick bites when you are outdoors:  Wear long sleeves and long pants.  Wear white clothes so you can see ticks more easily.  Tuck your pant legs into your socks.  If walking on a trail, stay in the middle of the trail to avoid brushing against bushes.  Avoid walking through areas with long grass.  Put bug spray on all skin that is showing and along boot tops, pant legs, and sleeve cuffs.  Check clothes, hair, and skin often and before going inside.  Brush off any ticks that are not attached.  Take a shower or bath as soon as possible after being outdoors.  HOW SHOULD YOU REMOVE A TICK? Ticks should be removed as soon as possible to help prevent diseases. 1. If latex gloves are available, put them on before trying to remove a tick. 2. Use tweezers to grasp the tick as close to the skin as possible. You may also use curved forceps or a tick removal tool. Grasp the tick as close to its head as possible. Avoid grasping the tick on its body. 3. Pull gently upward until the tick lets go. Do not twist the tick or jerk it suddenly. This may break off the tick's head or mouth parts. 4. Do not squeeze or crush the tick's body. This could force disease-carrying fluids from the tick into your body. 5. After the tick is removed, wash the bite area and your hands with soap and water or  alcohol. 6. Apply a small amount of antiseptic cream or ointment to the bite site. 7. Wash any tools that were used.  Do not try to remove a tick by applying a hot match, petroleum jelly, or fingernail polish to the tick. These methods do not work. They may also increase the chances of disease being spread from the tick bite. WHEN SHOULD YOU SEEK HELP? Contact your health care provider if you are unable to remove a tick or if a part of the tick breaks off in the skin. After a tick bite, you need to watch for signs and symptoms of diseases that can be spread by ticks. Contact your health care provider if you develop any of the following:  Fever.  Rash.  Redness and puffiness (swelling) in the area of the tick bite.  Tender, puffy lymph glands.  Watery poop (diarrhea).  Weight loss.  Cough.  Feeling more tired than normal (fatigue).  Muscle, joint, or bone pain.  Belly (abdominal) pain.  Headache.  Change in your level of consciousness.  Trouble walking or moving your legs.  Loss of feeling (numbness) in the legs.  Loss of movement (paralysis).  Shortness of breath.  Confusion.  Throwing up (vomiting) many times.  This information is not intended to replace advice given to you by your health care provider. Make sure you discuss any questions you have with  your health care provider. Document Released: 09/26/2009 Document Revised: 12/08/2015 Document Reviewed: 12/10/2012 Elsevier Interactive Patient Education  [employer]2018 [employer]

## 2017-02-13 LAB — ROCKY MTN SPOTTED FVR ABS PNL(IGG+IGM)
RMSF IGM: 0.23 {index} (ref 0.00–0.89)
RMSF IgG: NEGATIVE

## 2017-02-13 LAB — LYME, IGM, EARLY TEST/REFLEX

## 2017-02-15 DIAGNOSIS — M545 Low back pain: Secondary | ICD-10-CM | POA: Diagnosis not present

## 2017-02-15 DIAGNOSIS — G894 Chronic pain syndrome: Secondary | ICD-10-CM | POA: Diagnosis not present

## 2017-02-15 DIAGNOSIS — M47816 Spondylosis without myelopathy or radiculopathy, lumbar region: Secondary | ICD-10-CM | POA: Diagnosis not present

## 2017-02-15 DIAGNOSIS — Z79891 Long term (current) use of opiate analgesic: Secondary | ICD-10-CM | POA: Diagnosis not present

## 2017-02-15 DIAGNOSIS — Z79899 Other long term (current) drug therapy: Secondary | ICD-10-CM | POA: Diagnosis not present

## 2017-02-18 LAB — E. CHAFFEENSIS-HME (MONOCYTIC)
E. CHAFFEENSIS (HME) IGM TITER: NEGATIVE
E. CHAFFEENSIS IGG AB: NEGATIVE

## 2017-02-18 LAB — SPECIMEN STATUS REPORT

## 2017-03-07 ENCOUNTER — Telehealth: Payer: Self-pay | Admitting: Family Medicine

## 2017-03-07 NOTE — Telephone Encounter (Signed)
Patient got your phone call the labs, he said he already saw the results were normal so he doesn't need a call back about these. He just wants to make sure that he can get his disability placard form soon and to call when it is ready

## 2017-03-20 DIAGNOSIS — M545 Low back pain: Secondary | ICD-10-CM | POA: Diagnosis not present

## 2017-03-20 DIAGNOSIS — M79606 Pain in leg, unspecified: Secondary | ICD-10-CM | POA: Diagnosis not present

## 2017-03-20 DIAGNOSIS — M961 Postlaminectomy syndrome, not elsewhere classified: Secondary | ICD-10-CM | POA: Diagnosis not present

## 2017-03-20 DIAGNOSIS — G894 Chronic pain syndrome: Secondary | ICD-10-CM | POA: Diagnosis not present

## 2017-03-28 ENCOUNTER — Ambulatory Visit (INDEPENDENT_AMBULATORY_CARE_PROVIDER_SITE_OTHER): Payer: Medicare Other | Admitting: Family Medicine

## 2017-03-28 ENCOUNTER — Ambulatory Visit: Payer: Medicare Other

## 2017-03-28 ENCOUNTER — Encounter: Payer: Self-pay | Admitting: Family Medicine

## 2017-03-28 VITALS — BP 137/87 | HR 64 | Ht 71.0 in | Wt 345.0 lb

## 2017-03-28 DIAGNOSIS — R600 Localized edema: Secondary | ICD-10-CM

## 2017-03-28 DIAGNOSIS — E559 Vitamin D deficiency, unspecified: Secondary | ICD-10-CM

## 2017-03-28 DIAGNOSIS — M79672 Pain in left foot: Secondary | ICD-10-CM

## 2017-03-28 DIAGNOSIS — R7303 Prediabetes: Secondary | ICD-10-CM

## 2017-03-28 DIAGNOSIS — F329 Major depressive disorder, single episode, unspecified: Secondary | ICD-10-CM

## 2017-03-28 DIAGNOSIS — M7662 Achilles tendinitis, left leg: Secondary | ICD-10-CM

## 2017-03-28 DIAGNOSIS — I872 Venous insufficiency (chronic) (peripheral): Secondary | ICD-10-CM

## 2017-03-28 DIAGNOSIS — G8929 Other chronic pain: Secondary | ICD-10-CM

## 2017-03-28 DIAGNOSIS — F32A Depression, unspecified: Secondary | ICD-10-CM

## 2017-03-28 LAB — POCT GLYCOSYLATED HEMOGLOBIN (HGB A1C): HEMOGLOBIN A1C: 6.2

## 2017-03-28 MED ORDER — METFORMIN HCL 500 MG PO TABS: 500.0000 mg | ORAL_TABLET | Freq: Two times a day (BID) | ORAL | 3 refills | Status: DC

## 2017-03-28 NOTE — Patient Instructions (Addendum)
Check your blood sugar in the mornings after you eat especially good or especially on healthy the night before.  Fasting blood sugars should be between 60 and 100 and definitely less than 125.  What I recommend you do is call your insurance company and find out which glucometer they recommend then call the office and tell Melissa and she can call it in for you.       Achilles Tendinitis Achilles tendinitis is inflammation of the tough, cord-like band that attaches the lower leg muscles to the heel bone (Achilles tendon). This is usually caused by overusing the tendon and the ankle joint. Achilles tendinitis usually gets better over time with treatment and caring for yourself at home. It can take weeks or months to heal completely. What are the causes? This condition may be caused by:  A sudden increase in exercise or activity, such as running.  Doing the same exercises or activities (such as jumping) over and over.  Not warming up calf muscles before exercising.  Exercising in shoes that are worn out or not made for exercise.  Having arthritis or a bone growth (spur) on the back of the heel bone. This can rub against the tendon and hurt it.  Age-related wear and tear. Tendons become less flexible with age and more likely to be injured.  What are the signs or symptoms? Common symptoms of this condition include:  Pain in the Achilles tendon or in the back of the leg, just above the heel. The pain usually gets worse with exercise.  Stiffness or soreness in the back of the leg, especially in the morning.  Swelling of the skin over the Achilles tendon.  Thickening of the tendon.  Bone spurs at the bottom of the Achilles tendon, near the heel.  Trouble standing on tiptoe.  How is this diagnosed? This condition is diagnosed based on your symptoms and a physical exam. You may have tests, including:  X-rays.  MRI.  How is this treated? The goal of treatment is to relieve  symptoms and help your injury heal. Treatment may include:  Decreasing or stopping activities that caused the tendinitis. This may mean switching to low-impact exercises like biking or swimming.  Icing the injured area.  Doing physical therapy, including strengthening and stretching exercises.  NSAIDs to help relieve pain and swelling.  Using supportive shoes, wraps, heel lifts, or a walking boot (air cast).  Surgery. This may be done if your symptoms do not improve after 6 months.  Using high-energy shock wave impulses to stimulate the healing process (extracorporeal shock wave therapy). This is rare.  Injection of medicines to help relieve inflammation (corticosteroids). This is rare.  Follow these instructions at home: If you have an air cast:  Wear the cast as told by your health care provider. Remove it only as told by your health care provider.  Loosen the cast if your toes tingle, become numb, or turn cold and blue. Activity  Gradually return to your normal activities once your health care provider approves. Do not do activities that cause pain. ? Consider doing low-impact exercises, like cycling or swimming.  If you have an air cast, ask your health care provider when it is safe for you to drive.  If physical therapy was prescribed, do exercises as told by your health care provider or physical therapist. Managing pain, stiffness, and swelling  Raise (elevate) your foot above the level of your heart while you are sitting or lying down.  Move  your toes often to avoid stiffness and to lessen swelling.  If directed, put ice on the injured area: ? Put ice in a plastic bag. ? Place a towel between your skin and the bag. ? Leave the ice on for 20 minutes, 2-3 times a day General instructions  If directed, wrap your foot with an elastic bandage or other wrap. This can help keep your tendon from moving too much while it heals. Your health care provider will show you how to  wrap your foot correctly.  Wear supportive shoes or heel lifts only as told by your health care provider.  Take over-the-counter and prescription medicines only as told by your health care provider.  Keep all follow-up visits as told by your health care provider. This is important. Contact a health care provider if:  You have symptoms that gets worse.  You have pain that does not get better with medicine.  You develop new, unexplained symptoms.  You develop warmth and swelling in your foot.  You have a fever. Get help right away if:  You have a sudden popping sound or sensation in your Achilles tendon followed by severe pain.  You cannot move your toes or foot.  You cannot put any weight on your foot. Summary  Achilles tendinitis is inflammation of the tough, cord-like band that attaches the lower leg muscles to the heel bone (Achilles tendon).  This condition is usually caused by overusing the tendon and the ankle joint. It can also be caused by arthritis or normal aging.  The most common symptoms of this condition include pain, swelling, or stiffness in the Achilles tendon or in the back of the leg.  This condition is usually treated with rest, NSAIDs, and physical therapy. This information is not intended to replace advice given to you by your health care provider. Make sure you discuss any questions you have with your health care provider. Document Released: 04/11/2005 Document Revised: 05/21/2016 Document Reviewed: 05/21/2016 Elsevier Interactive Patient Education  2017 [employer]     Achilles Tendinitis Rehab Ask your health care provider which exercises are safe for you. Do exercises exactly as told by your health care provider and adjust them as directed. It is normal to feel mild stretching, pulling, tightness, or discomfort as you do these exercises, but you should stop right away if you feel sudden pain or your pain gets worse. Do not begin these exercises  until told by your health care provider. Stretching and range of motion exercises These exercises warm up your muscles and joints and improve the movement and flexibility of your ankle. These exercises also help to relieve pain, numbness, and tingling. Exercise A: Standing wall calf stretch, knee straight  1. Stand with your hands against a wall. 2. Extend your __________ leg behind you and bend your front knee slightly. Keep both of your heels on the floor. 3. Point the toes of your back foot slightly inward. 4. Keeping your heels on the floor and your back knee straight, shift your weight toward the wall. Do not allow your back to arch. You should feel a gentle stretch in your calf. 5. Hold this position for seconds. Repeat __________ times. Complete this stretch __________ times per day. Exercise B: Standing wall calf stretch, knee bent 1. Stand with your hands against a wall. 2. Extend your __________ leg behind you, and bend your front knee slightly. Keep both of your heels on the floor. 3. Point the toes of your back foot  slightly inward. 4. Keeping your heels on the floor, unlock your back knee so that it is bent. You should feel a gentle stretch deep in your calf. 5. Hold this position for __________ seconds. Repeat __________ times. Complete this stretch __________ times per day. Strengthening exercises These exercises build strength and control of your ankle. Endurance is the ability to use your muscles for a long time, even after they get tired. Exercise C: Plantar flexion with band  1. Sit on the floor with your __________ leg extended. You may put a pillow under your calf to give your foot more room to move. 2. Loop a rubber exercise band or tube around the ball of your __________ foot. The ball of your foot is on the walking surface, right under your toes. The band or tube should be slightly tense when your foot is relaxed. If the band or tube slips, you can put on your shoe or  put a washcloth between the band and your foot to help it stay in place. 3. Slowly point your toes downward, pushing them away from you. 4. Hold this position for __________ seconds. 5. Slowly release the tension in the band or tube, controlling smoothly until your foot is back to the starting position. Repeat __________ times. Complete this exercise __________ times per day. Exercise D: Heel raise with eccentric lower  1. Stand on a step with the balls of your feet. The ball of your foot is on the walking surface, right under your toes. ? Do not put your heels on the step. ? For balance, rest your hands on the wall or on a railing. 2. Rise up onto the balls of your feet. 3. Keeping your heels up, shift all of your weight to your __________ leg and pick up your other leg. 4. Slowly lower your __________ leg so your heel drops below the level of the step. 5. Put down your foot. If told by your health care provider, build up to:  3 sets of 15 repetitions while keeping your knees straight.  3 sets of 15 repetitions while keeping your knees bent as far as told by your health care provider.  Complete this exercise __________ times per day. If this exercise is too easy, try doing it while wearing a backpack with weights in it. Balance exercises These exercises improve or maintain your balance. Balance is important in preventing falls. Exercise E: Single leg stand 1. Without shoes, stand near a railing or in a door frame. Hold on to the railing or door frame as needed. 2. Stand on your __________ foot. Keep your big toe down on the floor and try to keep your arch lifted. 3. Hold this position for __________ seconds. Repeat __________ times. Complete this exercise __________ times per day. If this exercise is too easy, you can try it with your eyes closed or while standing on a pillow. This information is not intended to replace advice given to you by your health care provider. Make sure you  discuss any questions you have with your health care provider. Document Released: 01/31/2005 Document Revised: 03/08/2016 Document Reviewed: 03/08/2015 Elsevier Interactive Patient Education  [employer].

## 2017-03-28 NOTE — Progress Notes (Signed)
Impression and Recommendations:    1. Pain of left heel   2. Prediabetes: new onset 04/2016   3. Morbid obesity BMI >er 48   4. Bilateral lower extremity pitting edema   5. Vitamin D deficiency   6. Chronic venous stasis dermatitis of both lower extremities   7. h/o Depressive disorder   8. Acute on Chronic pain of left heel   9. acute Left heel pain   10. Achilles tendinitis of left lower extremity     Pain of left heel - Plan: XR Os Calcis Left, CANCELED: XR Os Calcis Left, CANCELED: CHG X-RAY HEEL, CANCELED: XR Os Calcis Left  Prediabetes: new onset 04/2016 - Plan: POCT glycosylated hemoglobin (Hb A1C), metFORMIN (GLUCOPHAGE) 500 MG tablet, Basic metabolic panel  Morbid obesity BMI >er 48  Bilateral lower extremity pitting edema  Vitamin D deficiency  Chronic venous stasis dermatitis of both lower extremities  h/o Depressive disorder  Acute on Chronic pain of left heel - Plan: Ambulatory referral to Sports Medicine  acute Left heel pain  Achilles tendinitis of left lower extremity - Plan: Ambulatory referral to Sports Medicine  No problem-specific Assessment & Plan notes found for this encounter.   The patient was counseled, risk factors were discussed, anticipatory guidance given.   New Prescriptions   METFORMIN (GLUCOPHAGE) 500 MG TABLET    Take 1 tablet (500 mg total) by mouth 2 (two) times daily with a meal.     Discontinued Medications   No medications on file     Modified Medications   No medications on file      Orders Placed This Encounter  Procedures  . XR Os Calcis Left  . Basic metabolic panel  . Ambulatory referral to Sports Medicine  . POCT glycosylated hemoglobin (Hb A1C)     Gross side effects, risk and benefits, and alternatives of medications and treatment plan in general discussed with patient.  Patient is aware that all medications have potential side effects and we are unable to predict every side effect or drug-drug  interaction that may occur.   Patient will call with any questions prior to using medication if they have concerns.  Expresses verbal understanding and consents to current therapy and treatment regimen.  No barriers to understanding were identified.  Red flag symptoms and signs discussed in detail.  Patient expressed understanding regarding what to do in case of emergency\urgent symptoms  Please see AVS handed out to patient at the end of our visit for further patient instructions/ counseling done pertaining to today's office visit.   No Follow-up on file.     Note: This document was prepared using Dragon voice recognition software and may include unintentional dictation errors.  Robert [name] 11:15 AM --------------------------------------------------------------------------------------------------------------------------------------------------------------------------------------------------------------------------------------------    Subjective:    CC:  Chief Complaint  Patient presents with  . Follow-up    HPI: Robert [name] is a 61 y.o. male who presents to [facility] today for issues as discussed below.  1-1.5 mo ago twisted ankle/ feel into a divot in yard - immediate pain in heel then and sometimes W, sometimes better.    A1c last time 4 mo ago--> 6.3.   Today 6.2.  Eliminated ice cream.  Stopped white bread, less carbs.  BMP from 2009 showed that his glucose was elevated to 142 fasting back then.    Problem  acute Left heel pain  Chronic Pain of Left Heel  Wt Readings from Last 3 Encounters:  03/28/17 (!) 345 lb (156.5 kg)  02/11/17 (!) 341 lb (154.7 kg)  01/21/17 (!) 345 lb (156.5 kg)   BP Readings from Last 3 Encounters:  03/28/17 137/87  02/11/17 140/84  01/21/17 126/74   Pulse Readings from Last 3 Encounters:  03/28/17 64  02/11/17 89  01/21/17 76   BMI Readings from Last 3 Encounters:  03/28/17 48.12 kg/m    02/11/17 47.56 kg/m  01/21/17 48.12 kg/m     Patient Care Team    Relationship Specialty Notifications Start End  Thomasene Lot, DO PCP - General Family Medicine  02/20/16   Rachelle Hora, MD  Orthopedic Surgery  04/11/16   Ardell Isaacs, MD  Pain Medicine  04/11/16   Nyoka Cowden, MD Consulting Physician Pulmonary Disease  04/11/16    Comment: Obstructive sleep apnea, restrictive lung disease due to severe morbid obesity     Patient Active Problem List   Diagnosis Date Noted  . Vitamin D deficiency 05/08/2016    Priority: High  . Prediabetes: new onset 04/2016 05/08/2016    Priority: High  . Bilateral lower extremity pitting edema 04/11/2016    Priority: High  . Restrictive lung disease secondary to obesity 04/11/2016    Priority: High  . Morbid obesity BMI >er 48 02/15/2012    Priority: High  . Chronic venous stasis dermatitis of both lower extremities 05/08/2016    Priority: Medium  . OSA on CPAP- seen Dr Sherene Sires in past 04/11/2016    Priority: Medium  . Physical deconditioning 04/11/2016    Priority: Medium  . h/o Depressive disorder 04/11/2016    Priority: Medium  . History of tobacco use 04/11/2016    Priority: Low  . Postlaminectomy syndrome, lumbar region 01/04/2012    Priority: Low  . h/o Narcotic abuse 01/04/2012    Priority: Low  . Constipation 09/22/2011    Priority: Low  . Chronic back pain 09/22/2011    Priority: Low  . acute Left heel pain 03/28/2017  . Tick bite 02/11/2017  . Barrett's esophagus 12/07/2016  . Chronic pain of left heel 11/27/2016  . Lumbar radiculopathy 07/21/2012  . Chronic pain due to injury 07/21/2012  . Back pain 01/04/2012  . Dyspnea 11/27/2011  . Confusion 09/22/2011  . Abdominal pain 09/22/2011  . Dehydration 09/22/2011  . Nausea & vomiting 09/22/2011    Past Medical history, Surgical history, Family history, Social history, Allergies and Medications have been entered into the medical record, reviewed and changed  as needed.    Current Meds  Medication Sig  . albuterol (PROVENTIL HFA;VENTOLIN HFA) 108 (90 Base) MCG/ACT inhaler Inhale 2 puffs into the lungs every 6 (six) hours as needed for wheezing or shortness of breath.  . APPLE CIDER VINEGAR PO Take by mouth.  . B Complex Vitamins (B COMPLEX 100 PO) Take 1 capsule by mouth daily.  . Cholecalciferol (VITAMIN D3) 5000 units TABS 5,000 IU OTC vitamin D3 daily.  . cyclobenzaprine (FLEXERIL) 10 MG tablet Take 1 tablet by mouth 2 (two) times daily.  Marland Kitchen ibuprofen (ADVIL,MOTRIN) 200 MG tablet Take 200 mg by mouth every 6 (six) hours as needed for fever or mild pain.  . Misc Natural Products (GINSENG COMPLEX PO) Take by mouth.  . Potassium 75 MG TABS Take 1 tablet by mouth daily at 8 pm.  . traMADol (ULTRAM) 50 MG tablet Take 50 mg by mouth 3 (three) times daily. For pain  . Vitamin D, Ergocalciferol, (DRISDOL) 50000  units CAPS capsule Take 1 capsule (50,000 Units total) by mouth every 7 (seven) days.  Marland Kitchen. zinc gluconate 50 MG tablet Take 50 mg by mouth daily.    Allergies:  No Known Allergies   Review of Systems: General:   Denies fever, chills, unexplained weight loss.  Optho/Auditory:   Denies visual changes, blurred vision/LOV Respiratory:   Denies wheeze, DOE more than baseline levels.  Cardiovascular:   Denies chest pain, palpitations, new onset peripheral edema  Gastrointestinal:   Denies nausea, vomiting, diarrhea, abd pain.  Genitourinary: Denies dysuria, freq/ urgency, flank pain or discharge from genitals.  Endocrine:     Denies hot or cold intolerance, polyuria, polydipsia. Musculoskeletal:   Denies unexplained myalgias, joint swelling, unexplained arthralgias, gait problems.  Skin:  Denies new onset rash, suspicious lesions Neurological:     Denies dizziness, unexplained weakness, numbness  Psychiatric/Behavioral:   Denies mood changes, suicidal or homicidal ideations, hallucinations    Objective:   Blood pressure 137/87, pulse 64,  height 5\' 11"  (1.803 m), weight (!) 345 lb (156.5 kg). Body mass index is 48.12 kg/m. General:  Well Developed, well nourished, appropriate for stated age.  Neuro:  Alert and oriented,  extra-ocular muscles intact  HEENT:  Normocephalic, atraumatic, neck supple, no carotid bruits appreciated  Skin:  no gross rash, warm, pink. Cardiac:  RRR, S1 S2 Respiratory:  ECTA B/L and A/P, Not using accessory muscles, speaking in full sentences- unlabored. Vascular:  Ext warm, no cyanosis apprec.; cap RF less 2 sec. Psych:  No HI/SI, judgement and insight good, Euthymic mood. Full Affect.

## 2017-03-29 LAB — BASIC METABOLIC PANEL
BUN/Creatinine Ratio: 10 (ref 10–24)
BUN: 8 mg/dL (ref 8–27)
CALCIUM: 9.5 mg/dL (ref 8.6–10.2)
CHLORIDE: 101 mmol/L (ref 96–106)
CO2: 28 mmol/L (ref 20–29)
Creatinine, Ser: 0.83 mg/dL (ref 0.76–1.27)
GFR calc Af Amer: 108 mL/min/{1.73_m2} (ref >59)
GFR calc non Af Amer: 94 mL/min/{1.73_m2} (ref >59)
Glucose: 94 mg/dL (ref 65–99)
POTASSIUM: 4.5 mmol/L (ref 3.5–5.2)
Sodium: 142 mmol/L (ref 134–144)

## 2017-04-03 ENCOUNTER — Ambulatory Visit (INDEPENDENT_AMBULATORY_CARE_PROVIDER_SITE_OTHER): Payer: Medicare Other | Admitting: Family Medicine

## 2017-04-03 DIAGNOSIS — M7662 Achilles tendinitis, left leg: Secondary | ICD-10-CM

## 2017-04-03 MED ORDER — NITROGLYCERIN 0.2 MG/HR TD PT24: MEDICATED_PATCH | TRANSDERMAL | 0 refills | Status: DC

## 2017-04-03 NOTE — Patient Instructions (Signed)

## 2017-04-03 NOTE — Progress Notes (Signed)
Chief complaint: Posterior heel pain 6 months, acute on chronic pain 6 weeks  History of present illness: Robert [name] is a 61 year old male who presents to the sports medicine office today with chief complaint of left heel pain. He reports that this is been ongoing for approximately 6 months now. He reports this initially starting as he was walking down some steps, misstepped, reports having a forced plantarflexion and then fell, landed back and hit his calcaneus bone. He reports pain with dorsiflexion and eversion. He does not report of any immediate swelling, warmth, erythema, or ecchymosis. He does mostly wear walking sandals, occasionally wear shoes for hiking. He reports 6 weeks ago while cleaning out his yard he walked into a pothole, have forced plantar flexion and increased pain at the posterior heel near Achilles insertion. He did see his primary physician approximately a week ago. X ray of his left heel was done there, was told that there were no fractures or any acute bony issues. He was recommended to follow up here for further evaluation. He does not report of any previous left foot or ankle injury. he does not report of any numbness, tingling, or weakness. He has had his left knee replaced. He does not report of any left hip pain. He reports that his primary physician did give him some Achilles tendon physical therapy exercises to do at home, he has been doing this over the last few days. Has not used any medications for relief of symptoms.  His past medical history, surgical history, family history, social history are reviewed. Pertinent history includes hypertension, prediabetes, morbid obesity, postlaminectomy syndrome, status post left knee replacement, obstructive sleep apnea on CPAP. He reports of former tobacco use, none currently. His family history is noncontributory to orthopedic complaint.  Review of systems:  As stated above  Physical exam: Vital signs are reviewed and are documented  in the chart Gen.: Alert, oriented, appears stated age, in no apparent distress HEENT: Moist oral mucosa Respiratory: Normal respirations, able to speak in full sentences Cardiac: Regular rate, distal pulses 2+, 1+ pitting edema bilaterally up to the top of his ankles Integumentary: No rashes on visible skin:  Neurologic: Strength 5/5, sensation 2+ bilateral lower extremities Psych: Normal affect, mood is described as good Musculoskeletal: Inspection of left foot reveals no obvious deformity or muscle atrophy, no warmth, erythema, ecchymosis, or effusion noted. He is tender to palpation at insertion of Achilles onto calcaneus, no tenderness over mid substance of Achilles, calcaneal squeeze test positive, pain elicited with dorsiflexion and eversion, both active and passively, no pain elicited with plantar flexion and inversion, anterior drawer and talar tilt negative  Limited musculoskeletal ultrasound of his left Achilles was performed in the office today. He does have increase Achilles size over the insertional component of Achilles over calcaneus. Measurement of this is 1 cm, midportion of Achilles is 0.6 cm. Do see slight hypoechogenicity within the distal Achilles where it inserts on the calcaneus, both on long axis and short axis views. Do see some slight swelling near the retrocalcaneal bursa, but the bursa was not fully viewed. Color Doppler that showed neovascularization at distal aspect near Achilles insertion to calcaneus. For comparison, the right Achilles measures 0.6 cm at the insertional aspect. He does have calcaneal spurring noted on left heel, otherwise no other abnormalities seen on ultrasound.  Impression: Left insertional Achilles tendinopathy  Assessment and plan: 1. Left posterior heel pain  Posterior heel pain -Discussed with patient based on history, physical exam, and ultrasound  findings he does have diagnosis of left insertional Achilles tendinopathy, with left Achilles  distally measuring 1 cm, which is abnormal -Personally and independently reviewed x-ray from office visit last week here in the office, he does have some slight degenerative changes noted in the calcaneus, with subchondral sclerosis and subchondral cysts noted, no evidence of any type of avulsion fracture or injury -Will start patient on nitroglycerin protocol, he does not have any contraindications for this, does not have history of migraines or rosacea, discussed to apply one fourth nitroglycerin patch to affected area daily, discussed changing position a patch every 24 hours, discussed side effects of headache and skin irritation -Discussed not to use Viagra, Cialis, or Levitra with this -Discussed eccentric exercises for him to do at home, mainly telling him not to go further below level of step in lowering left heel  Will have patient follow-up in 6 weeks, will plan for repeat ultrasound at that time. Otherwise, he will return to clinic sooner as needed.   Haynes Kerns, M.D. Primary Care Sports Medicine Fellow [facility]

## 2017-04-17 DIAGNOSIS — M545 Low back pain: Secondary | ICD-10-CM | POA: Diagnosis not present

## 2017-04-17 DIAGNOSIS — G894 Chronic pain syndrome: Secondary | ICD-10-CM | POA: Diagnosis not present

## 2017-04-17 DIAGNOSIS — M961 Postlaminectomy syndrome, not elsewhere classified: Secondary | ICD-10-CM | POA: Diagnosis not present

## 2017-04-17 DIAGNOSIS — M47816 Spondylosis without myelopathy or radiculopathy, lumbar region: Secondary | ICD-10-CM | POA: Diagnosis not present

## 2017-04-30 ENCOUNTER — Other Ambulatory Visit: Payer: Self-pay

## 2017-04-30 MED ORDER — NITROGLYCERIN 0.2 MG/HR TD PT24: MEDICATED_PATCH | TRANSDERMAL | 0 refills | Status: DC

## 2017-05-01 DIAGNOSIS — M47817 Spondylosis without myelopathy or radiculopathy, lumbosacral region: Secondary | ICD-10-CM | POA: Diagnosis not present

## 2017-05-09 ENCOUNTER — Ambulatory Visit (INDEPENDENT_AMBULATORY_CARE_PROVIDER_SITE_OTHER): Payer: Medicare Other | Admitting: Family Medicine

## 2017-05-09 ENCOUNTER — Encounter: Payer: Self-pay | Admitting: Family Medicine

## 2017-05-09 VITALS — BP 149/82 | HR 67 | Ht 71.5 in | Wt 356.1 lb

## 2017-05-09 DIAGNOSIS — E860 Dehydration: Secondary | ICD-10-CM | POA: Diagnosis not present

## 2017-05-09 DIAGNOSIS — R5381 Other malaise: Secondary | ICD-10-CM | POA: Diagnosis not present

## 2017-05-09 DIAGNOSIS — R600 Localized edema: Secondary | ICD-10-CM

## 2017-05-09 DIAGNOSIS — R252 Cramp and spasm: Secondary | ICD-10-CM | POA: Diagnosis not present

## 2017-05-09 DIAGNOSIS — E559 Vitamin D deficiency, unspecified: Secondary | ICD-10-CM

## 2017-05-09 DIAGNOSIS — R7303 Prediabetes: Secondary | ICD-10-CM

## 2017-05-09 DIAGNOSIS — Z23 Encounter for immunization: Secondary | ICD-10-CM

## 2017-05-09 DIAGNOSIS — Z9989 Dependence on other enabling machines and devices: Secondary | ICD-10-CM

## 2017-05-09 DIAGNOSIS — G4733 Obstructive sleep apnea (adult) (pediatric): Secondary | ICD-10-CM

## 2017-05-09 MED ORDER — BLOOD GLUCOSE MONITOR KIT: PACK | 0 refills | Status: DC

## 2017-05-09 NOTE — Patient Instructions (Signed)
Risk factors for prediabetes and type 2 diabetes ° °Researchers don't fully understand why some people develop prediabetes and type 2 diabetes and others don't.  It's clear that certain factors increase the risk, however, including: ° °Weight. The more fatty tissue you have, the more resistant your cells become to insulin.  °Inactivity. The less active you are, the greater your risk. Physical activity helps you control your weight, uses up glucose as energy and makes your cells more sensitive to insulin.  °Family history. Your risk increases if a parent or sibling has type 2 diabetes.  °Race. Although it's unclear why, people of certain races -- including blacks, Hispanics, American Indians and Asian-Americans -- are at higher risk.  °Age. Your risk increases as you get older. This may be because you tend to exercise less, lose muscle mass and gain weight as you age. But type 2 diabetes is also increasing dramatically among children, adolescents and younger adults.  °Gestational diabetes. If you developed gestational diabetes when you were pregnant, your risk of developing prediabetes and type 2 diabetes later increases. If you gave birth to a baby weighing more than 9 pounds (4 kilograms), you're also at risk of type 2 diabetes.  °Polycystic ovary syndrome. For women, having polycystic ovary syndrome -- a common condition characterized by irregular menstrual periods, excess hair growth and obesity -- increases the risk of diabetes.  °High blood pressure. Having blood pressure over 140/90 millimeters of mercury (mm Hg) is linked to an increased risk of type 2 diabetes.  °Abnormal cholesterol and triglyceride levels. If you have low levels of high-density lipoprotein (HDL), or "good," cholesterol, your risk of type 2 diabetes is higher. Triglycerides are another type of fat carried in the blood. People with high levels of triglycerides have an increased risk of type 2 diabetes. Your doctor can let you know what your  cholesterol and triglyceride levels are. ° °A good guide to good carbs: The glycemic index °---If you have diabetes, or at risk for diabetes, you know all too well that when you eat carbohydrates, your blood sugar goes up. The total amount of carbs you consume at a meal or in a snack mostly determines what your blood sugar will do. But the food itself also plays a role. A serving of white rice has almost the same effect as eating pure table sugar -- a quick, high spike in blood sugar. A serving of lentils has a slower, smaller effect. ° °---Picking good sources of carbs can help you control your blood sugar and your weight. Even if you don't have diabetes, eating healthier carbohydrate-rich foods can help ward off a host of chronic conditions, from heart disease to various cancers to, well, diabetes. ° °---One way to choose foods is with the glycemic index (GI). This tool measures how much a food boosts blood sugar.  The glycemic index rates the effect of a specific amount of a food on blood sugar compared with the same amount of pure glucose. A food with a glycemic index of 28 boosts blood sugar only 28% as much as pure glucose. One with a GI of 95 acts like pure glucose. ° ° ° °High glycemic foods result in a quick spike in insulin and blood sugar (also known as blood glucose).  Low glycemic foods have a slower, smaller effect- these are healthier for you.  ° °Using the glycemic index °Using the glycemic index is easy: choose foods in the low GI category instead of those in the high   GI category (see below), and go easy on those in between. °Low glycemic index (GI of 55 or less): Most fruits and vegetables, beans, minimally processed grains, pasta, low-fat dairy foods, and nuts.  °Moderate glycemic index (GI 56 to 69): White and sweet potatoes, corn, white rice, couscous, breakfast cereals such as Cream of Wheat and Mini Wheats.  °High glycemic index (GI of 70 or higher): White bread, rice cakes, most crackers,  bagels, cakes, doughnuts, croissants, most packaged breakfast cereals. °You can see the values for 100 commons foods and get links to more at www.health.[employer].edu/glycemic. ° °Swaps for lowering glycemic index  °Instead of this high-glycemic index food Eat this lower-glycemic index food  °White rice Brown rice or converted rice  °Instant oatmeal Steel-cut oats  °Cornflakes Bran flakes  °Baked potato Pasta, bulgur  °White bread Whole-grain bread  °Corn Peas or leafy greens  ° ° ° ° ° °Prediabetes Eating Plan ° °Prediabetes--also called impaired glucose tolerance or impaired fasting glucose--is a condition that causes blood sugar (blood glucose) levels to be higher than normal. Following a healthy diet can help to keep prediabetes under control. It can also help to lower the risk of type 2 diabetes and heart disease, which are increased in people who have prediabetes. Along with regular exercise, a healthy diet: °· Promotes weight loss. °· Helps to control blood sugar levels. °· Helps to improve the way that the body uses insulin. ° ° °WHAT DO I NEED TO KNOW ABOUT THIS EATING PLAN? ° °· Use the glycemic index (GI) to plan your meals. The index tells you how quickly a food will raise your blood sugar. Choose low-GI foods. These foods take a longer time to raise blood sugar. °· Pay close attention to the amount of carbohydrates in the food that you eat. Carbohydrates increase blood sugar levels. °· Keep track of how many calories you take in. Eating the right amount of calories will help you to achieve a healthy weight. Losing about 7 percent of your starting weight can help to prevent type 2 diabetes. °· You may want to follow a Mediterranean diet. This diet includes a lot of vegetables, lean meats or fish, whole grains, fruits, and healthy oils and fats. ° ° °WHAT FOODS CAN I EAT? ° °Grains °Whole grains, such as whole-wheat or whole-grain breads, crackers, cereals, and pasta. Unsweetened oatmeal. Bulgur. Barley.  Quinoa. Brown rice. Corn or whole-wheat flour tortillas or taco shells. °Vegetables °Lettuce. Spinach. Peas. Beets. Cauliflower. Cabbage. Broccoli. Carrots. Tomatoes. Squash. Eggplant. Herbs. Peppers. Onions. Cucumbers. Brussels sprouts. °Fruits °Berries. Bananas. Apples. Oranges. Grapes. Papaya. Mango. Pomegranate. Kiwi. Grapefruit. Cherries. °Meats and Other Protein Sources °Seafood. Lean meats, such as chicken and turkey or lean cuts of pork and beef. Tofu. Eggs. Nuts. Beans. °Dairy °Low-fat or fat-free dairy products, such as yogurt, cottage cheese, and cheese. °Beverages °Water. Tea. Coffee. Sugar-free or diet soda. Seltzer water. Milk. Milk alternatives, such as soy or almond milk. °Condiments °Mustard. Relish. Low-fat, low-sugar ketchup. Low-fat, low-sugar barbecue sauce. Low-fat or fat-free mayonnaise. °Sweets and Desserts °Sugar-free or low-fat pudding. Sugar-free or low-fat ice cream and other frozen treats. °Fats and Oils °Avocado. Walnuts. Olive oil. °The items listed above may not be a complete list of recommended foods or beverages. Contact your dietitian for more options.  ° ° °WHAT FOODS ARE NOT RECOMMENDED? ° °Grains °Refined white flour and flour products, such as bread, pasta, snack foods, and cereals. °Beverages °Sweetened drinks, such as sweet iced tea and soda. °Sweets and Desserts °  Baked goods, such as cake, cupcakes, pastries, cookies, and cheesecake. °The items listed above may not be a complete list of foods and beverages to avoid. Contact your dietitian for more information. °  °This information is not intended to replace advice given to you by your health care provider. Make sure you discuss any questions you have with your health care provider. °  °Document Released: 11/16/2014 Document Reviewed: 11/16/2014 °[employer]vier Interactive Patient Education ©2016 [employer] ° ° ° °

## 2017-05-09 NOTE — Progress Notes (Signed)
Impression and Recommendations:    1. Prediabetes: new onset 04/2016   2. Morbid obesity BMI >er 48   3. Bilateral lower extremity pitting edema   4. OSA on CPAP- seen Dr Sherene Sires in past   5. Physical deconditioning   6. Bilateral leg cramps   7. Dehydration   8. Vitamin D deficiency   9. Flu vaccine need   10. Need for Tdap vaccination      1. Flu vaccine need - Flu Vaccine QUAD 36+ mos IM  2. Need for Tdap vaccination    No problem-specific Assessment & Plan notes found for this encounter.   Education and routine counseling performed. Handouts provided.  New Prescriptions   BLOOD GLUCOSE METER KIT AND SUPPLIES KIT    Dispense based on patient and insurance preference. Use up to four times daily as directed. (FOR ICD-9 250.00, 250.01).    Meds ordered this encounter  Medications   blood glucose meter kit and supplies KIT    Sig: Dispense based on patient and insurance preference. Use up to four times daily as directed. (FOR ICD-9 250.00, 250.01).    Dispense:  1 each    Refill:  0    Order Specific Question:   Number of strips    Answer:   360    Order Specific Question:   Number of lancets    Answer:   360    Modified Medications   No medications on file    Discontinued Medications   No medications on file     Orders Placed This Encounter  Procedures   Flu Vaccine QUAD 36+ mos IM   CBC with Differential/Platelet   Comprehensive metabolic panel   Lipid panel   VITAMIN D 25 Hydroxy (Vit-D Deficiency, Fractures)   TSH   T4, free     Return for Prediabetes follow-up (after starting metformin mid-Sept ) in mid December.  The patient was counseled, risk factors were discussed, anticipatory guidance given.  Gross side effects, risk and benefits, and alternatives of medications discussed with patient.  Patient is aware that all medications have potential side effects and we are unable to predict every side effect or drug-drug interaction that may  occur.  Expresses verbal understanding and consents to current therapy plan and treatment regimen.  Please see AVS handed out to patient at the end of our visit for further patient instructions/ counseling done pertaining to today's office visit.    Note:  This document was prepared using Dragon voice recognition software and may include unintentional dictation errors.     Subjective:    Chief Complaint  Patient presents with   Follow-up     Robert [name] is a 61 y.o. male who presents to [facility] today for Diabetes Management.     DM HPI: Pre-DM 10-15 yrs- last OV we decided to put pt on metformin.  Tolerating it well.   Not checking sugars b/c he called Medicare and they did not tell him which glucometer is best as he could not get in touch with anybody there    Pt is currently maintained on the following medications for diabetes:   see med list today Medication compliance -   Home glucose readings range    Denies polyuria/polydipsia. Denies hypo/ hyperglycemia symptoms - He denies new onset of: chest pain, exercise intolerance, shortness of breath, dizziness, visual changes, headache, lower extremity swelling or claudication.   Last diabetic eye exam was  No results found for: HMDIABEYEEXA  Foot exam- UTD  Last A1C in the office was:  Lab Results  Component Value Date   HGBA1C 6.2 03/28/2017   HGBA1C 6.3 11/27/2016   HGBA1C 6.2 (H) 04/25/2016    Lab Results  Component Value Date   LDLCALC 91 04/25/2016   CREATININE 0.83 03/28/2017      Last 3 blood pressure readings in our office are as follows: BP Readings from Last 3 Encounters:  05/09/17 (!) 149/82  04/03/17 134/79  03/28/17 137/87    BMI Readings from Last 3 Encounters:  05/09/17 48.97 kg/m  04/03/17 46.76 kg/m  03/28/17 48.12 kg/m     Problem  Bilateral Leg Cramps      Patient Care Team    Relationship Specialty Notifications Start End  Thomasene Lot, DO PCP - General Family Medicine  02/20/16   Rachelle Hora, MD  Orthopedic Surgery  04/11/16   Ardell Isaacs, MD  Pain Medicine  04/11/16   Nyoka Cowden, MD Consulting Physician Pulmonary Disease  04/11/16    Comment: Obstructive sleep apnea, restrictive lung disease due to severe morbid obesity     Patient Active Problem List   Diagnosis Date Noted   Vitamin D deficiency 05/08/2016    Priority: High   Prediabetes: new onset 04/2016 05/08/2016    Priority: High   Bilateral lower extremity pitting edema 04/11/2016    Priority: High   Restrictive lung disease secondary to obesity 04/11/2016    Priority: High   Morbid obesity BMI >er 48 02/15/2012    Priority: High   Chronic venous stasis dermatitis of both lower extremities 05/08/2016    Priority: Medium   OSA on CPAP- seen Dr Sherene Sires in past 04/11/2016    Priority: Medium   Physical deconditioning 04/11/2016    Priority: Medium   h/o Depressive disorder 04/11/2016    Priority: Medium   History of tobacco use 04/11/2016    Priority: Low   Postlaminectomy syndrome, lumbar region 01/04/2012    Priority: Low   h/o Narcotic abuse 01/04/2012    Priority: Low   Constipation 09/22/2011    Priority: Low   Chronic back pain 09/22/2011    Priority: Low   Bilateral leg cramps 05/09/2017   acute Left heel pain 03/28/2017   Tick bite 02/11/2017   Barrett's esophagus 12/07/2016   Chronic pain of left heel 11/27/2016   Lumbar radiculopathy 07/21/2012   Chronic pain due to injury 07/21/2012   Back pain 01/04/2012   Dyspnea 11/27/2011   Confusion 09/22/2011   Abdominal pain 09/22/2011   Dehydration 09/22/2011   Nausea & vomiting 09/22/2011     Past Medical History:  Diagnosis Date   Allergic rhinitis due to pollen    Arthritis    Asthma    Back pain    Barrett esophagus    Depressive disorder    Exposure to hepatitis B    Exposure to hepatitis C    Narcotic abuse (HCC)    pain medications   OSA (obstructive  sleep apnea)    on CPAP      Past Surgical History:  Procedure Laterality Date   ABDOMINAL SURGERY     BACK SURGERY     Rods and screws in lumbar area   COLONOSCOPY     ESOPHAGOGASTRODUODENOSCOPY  multiple   HERNIA REPAIR     4   JOINT REPLACEMENT     TOTAL KNEE ARTHROPLASTY  2009   left  Family History  Problem Relation Age of Onset   Allergies Mother    Early death Father    Thyroid disease Sister    Colon cancer Neg Hx    Stomach cancer Neg Hx      History  Drug Use No    Comment: Prior abuse of narcotics  ,  History  Alcohol Use No  ,  History  Smoking Status   Former Smoker   Packs/day: 0.80   Years: 15.00   Types: Cigarettes   Quit date: 07/16/2004  Smokeless Tobacco   Never Used  ,    Current Outpatient Prescriptions on File Prior to Visit  Medication Sig Dispense Refill   albuterol (PROVENTIL HFA;VENTOLIN HFA) 108 (90 Base) MCG/ACT inhaler Inhale 2 puffs into the lungs every 6 (six) hours as needed for wheezing or shortness of breath.     APPLE CIDER VINEGAR PO Take by mouth.     B Complex Vitamins (B COMPLEX 100 PO) Take 1 capsule by mouth daily.     Cholecalciferol (VITAMIN D3) 5000 units TABS 5,000 IU OTC vitamin D3 daily. 90 tablet 3   cyclobenzaprine (FLEXERIL) 10 MG tablet Take 1 tablet by mouth 2 (two) times daily.     ibuprofen (ADVIL,MOTRIN) 200 MG tablet Take 200 mg by mouth every 6 (six) hours as needed for fever or mild pain.     metFORMIN (GLUCOPHAGE) 500 MG tablet Take 1 tablet (500 mg total) by mouth 2 (two) times daily with a meal. 180 tablet 3   Misc Natural Products (GINSENG COMPLEX PO) Take by mouth.     nitroGLYCERIN (NITRODUR - DOSED IN MG/24 HR) 0.2 mg/hr patch Place 1/4 patch on affected area daily. 30 patch 0   Potassium 75 MG TABS Take 1 tablet by mouth daily at 8 pm.     traMADol (ULTRAM) 50 MG tablet Take 50 mg by mouth 3 (three) times daily. For pain     Vitamin D, Ergocalciferol, (DRISDOL) 50000 units CAPS capsule  Take 1 capsule (50,000 Units total) by mouth every 7 (seven) days. 12 capsule 10   zinc gluconate 50 MG tablet Take 50 mg by mouth daily.     No current facility-administered medications on file prior to visit.      No Known Allergies   Review of Systems:   General:  Denies fever, chills Optho/Auditory:   Denies visual changes, blurred vision Respiratory:   Denies SOB, cough, wheeze, DIB  Cardiovascular:   Denies chest pain, palpitations, painful respirations Gastrointestinal:   Denies nausea, vomiting, diarrhea.  Endocrine:     Denies new hot or cold intolerance Musculoskeletal:  Denies joint swelling, gait issues, or new unexplained myalgias/ arthralgias Skin:  Denies rash, suspicious lesions  Neurological:    Denies dizziness, unexplained weakness, numbness  Psychiatric/Behavioral:   Denies mood changes    Objective:     Blood pressure (!) 149/82, pulse 67, height 5' 11.5" (1.816 m), weight (!) 356 lb 1.6 oz (161.5 kg).  Body mass index is 48.97 kg/m.  General: Well Developed, well nourished, and in no acute distress.  HEENT: Normocephalic, atraumatic, pupils equal round reactive to light, neck supple, No carotid bruits, no JVD Skin: Warm and dry, cap RF less 2 sec Cardiac: Regular rate and rhythm, S1, S2 WNL's, no murmurs rubs or gallops Respiratory: ECTA B/L, Not using accessory muscles, speaking in full sentences. NeuroM-Sk: Ambulates w/o assistance, moves ext * 4 w/o difficulty, sensation grossly intact.  Ext: scant edema b/l lower ext  Psych: No HI/SI, judgement and insight good, Euthymic mood. Full Affect.

## 2017-05-10 LAB — COMPREHENSIVE METABOLIC PANEL
ALBUMIN: 4.3 g/dL (ref 3.6–4.8)
ALT: 35 IU/L (ref 0–44)
AST: 27 IU/L (ref 0–40)
Albumin/Globulin Ratio: 2.3 — ABNORMAL HIGH (ref 1.2–2.2)
Alkaline Phosphatase: 70 IU/L (ref 39–117)
BILIRUBIN TOTAL: 0.5 mg/dL (ref 0.0–1.2)
BUN / CREAT RATIO: 17 (ref 10–24)
BUN: 13 mg/dL (ref 8–27)
CHLORIDE: 103 mmol/L (ref 96–106)
CO2: 29 mmol/L (ref 20–29)
CREATININE: 0.76 mg/dL (ref 0.76–1.27)
Calcium: 9.3 mg/dL (ref 8.6–10.2)
GFR calc non Af Amer: 97 mL/min/{1.73_m2} (ref >59)
GFR, EST AFRICAN AMERICAN: 112 mL/min/{1.73_m2} (ref >59)
GLUCOSE: 128 mg/dL — AB (ref 65–99)
Globulin, Total: 1.9 g/dL (ref 1.5–4.5)
Potassium: 4.8 mmol/L (ref 3.5–5.2)
Sodium: 144 mmol/L (ref 134–144)
TOTAL PROTEIN: 6.2 g/dL (ref 6.0–8.5)

## 2017-05-10 LAB — CBC WITH DIFFERENTIAL/PLATELET
BASOS ABS: 0 10*3/uL (ref 0.0–0.2)
BASOS: 0 %
EOS (ABSOLUTE): 0.2 10*3/uL (ref 0.0–0.4)
EOS: 4 %
HEMATOCRIT: 38 % (ref 37.5–51.0)
HEMOGLOBIN: 12.8 g/dL — AB (ref 13.0–17.7)
Immature Grans (Abs): 0 10*3/uL (ref 0.0–0.1)
Immature Granulocytes: 0 %
LYMPHS: 25 %
Lymphocytes Absolute: 1.1 10*3/uL (ref 0.7–3.1)
MCH: 30.9 pg (ref 26.6–33.0)
MCHC: 33.7 g/dL (ref 31.5–35.7)
MCV: 92 fL (ref 79–97)
MONOCYTES: 12 %
Monocytes Absolute: 0.6 10*3/uL (ref 0.1–0.9)
NEUTROS PCT: 59 %
Neutrophils Absolute: 2.7 10*3/uL (ref 1.4–7.0)
Platelets: 159 10*3/uL (ref 150–379)
RBC: 4.14 x10E6/uL (ref 4.14–5.80)
RDW: 13.7 % (ref 12.3–15.4)
WBC: 4.5 10*3/uL (ref 3.4–10.8)

## 2017-05-10 LAB — LIPID PANEL
Chol/HDL Ratio: 4.4 ratio (ref 0.0–5.0)
Cholesterol, Total: 140 mg/dL (ref 100–199)
HDL: 32 mg/dL — ABNORMAL LOW (ref >39)
LDL CALC: 81 mg/dL (ref 0–99)
Triglycerides: 133 mg/dL (ref 0–149)
VLDL CHOLESTEROL CAL: 27 mg/dL (ref 5–40)

## 2017-05-10 LAB — TSH: TSH: 1.79 u[IU]/mL (ref 0.450–4.500)

## 2017-05-10 LAB — VITAMIN D 25 HYDROXY (VIT D DEFICIENCY, FRACTURES): VIT D 25 HYDROXY: 52 ng/mL (ref 30.0–100.0)

## 2017-05-10 LAB — T4, FREE: FREE T4: 1.05 ng/dL (ref 0.82–1.77)

## 2017-05-15 ENCOUNTER — Ambulatory Visit: Payer: Medicare Other | Admitting: Family Medicine

## 2017-05-17 ENCOUNTER — Ambulatory Visit: Payer: Medicare Other | Admitting: Family Medicine

## 2017-05-20 DIAGNOSIS — M47816 Spondylosis without myelopathy or radiculopathy, lumbar region: Secondary | ICD-10-CM | POA: Diagnosis not present

## 2017-05-20 DIAGNOSIS — Z79899 Other long term (current) drug therapy: Secondary | ICD-10-CM | POA: Diagnosis not present

## 2017-05-20 DIAGNOSIS — M545 Low back pain: Secondary | ICD-10-CM | POA: Diagnosis not present

## 2017-05-20 DIAGNOSIS — M47817 Spondylosis without myelopathy or radiculopathy, lumbosacral region: Secondary | ICD-10-CM | POA: Diagnosis not present

## 2017-05-20 DIAGNOSIS — G894 Chronic pain syndrome: Secondary | ICD-10-CM | POA: Diagnosis not present

## 2017-05-20 DIAGNOSIS — Z79891 Long term (current) use of opiate analgesic: Secondary | ICD-10-CM | POA: Diagnosis not present

## 2017-05-22 ENCOUNTER — Encounter: Payer: Self-pay | Admitting: Family Medicine

## 2017-05-22 ENCOUNTER — Ambulatory Visit (INDEPENDENT_AMBULATORY_CARE_PROVIDER_SITE_OTHER): Payer: Medicare Other | Admitting: Family Medicine

## 2017-05-22 VITALS — BP 137/85 | Ht 71.5 in | Wt 340.0 lb

## 2017-05-22 DIAGNOSIS — M7662 Achilles tendinitis, left leg: Secondary | ICD-10-CM

## 2017-05-22 NOTE — Progress Notes (Signed)
Chief complaint: Follow up Achilles tendonopathy x 7.5 months, acute over 12 weeks  History of present illness: Robert [name] is a 61 year old male who presents to the sports medicine office today for follow-up of left heel pain. When he was here last time he was diagnosed with Achilles tendinopathy as diagnosed on ultrasound. He was started on nitroglycerin protocol and given home exercise program to do eccentric exercises. He reports overall today having 40% interval improvement in his symptoms. He reports that he has been doing exercises on his own on a daily basis, has also been using the nitroglycerin patch. He does not report of any interval injury or trauma. He has not had any headaches with the nitroglycerin patch. He reports that he is trying to lose weight. He does not report of any numbness, tingling, or burning paresthesias. He reports swelling in his ankles have gone down slightly. Today, he describes the pain as a throbbing pain, 2/10. Any type of prolonged walking dorsiflexion are aggravating factors.  Review of systems:  As stated above  Interval past medical history, surgical history, family history, and social history obtained and unchanged.  Physical exam: Vital signs are reviewed and are documented in the chart Gen.: Alert, oriented, appears stated age, in no apparent distress HEENT: Moist oral mucosa Respiratory: Normal respirations, able to speak in full sentences Cardiac: Regular rate, distal pulses 2+ Integumentary: No rashes on visible skin:  Neurologic: Strength 5/5, sensation 2+ Psych: Normal affect, mood is described as good Musculoskeletal: Inspection of left foot reveals no obvious deformity or muscle atrophy, no warmth, erythema, ecchymosis, or effusion noted, he is tender to palpation at insertion of Achilles onto calcaneus, some tenderness over precalcaneal bursa, no tenderness over mid substance of Achilles, calcaneal squeeze test negative, no pain elicited with active  or passive dorsiflexion, plantar flexion, inversion, eversion, anterior drawer and talar tilt negative  Limited musculoskeletal ultrasound of his left Achilles was performed in the office today. Key findings of the ultrasound revealed that he still continues to have Achilles tendinopathy, measurement of the Achilles tendon at the calcaneal insertion measures 1.54 cm, no evidence today of neovascularization on color flow Doppler, normal Achilles tendon size at the midportion. There is ultrasound evidence of chronic tendinopathy on short axis view at the calcaneal insertion, with slight hypoechogenicity seen in the midsubstance of Achilles tendon     Assessment and plan: 1. Left posterior heel pain, with diagnosis of left insertional Achilles tendinopathy  Plan: Discussed with patient that things are positive in the sense that his symptoms are improving. Discussed to have him continue with nitroglycerin patch for an additional 6 weeks. Congratulated him on weight loss and encouraged continued weight loss. Discussed continued eccentric strengthening exercises. Did offer formal physical therapy option to him, which he declines today due to financial reasons. Will have him follow-up in 6 weeks or sooner as needed.   Robert [name], M.D. Primary Care Sports Medicine Fellow [facility]

## 2017-05-28 ENCOUNTER — Other Ambulatory Visit: Payer: Self-pay

## 2017-05-28 DIAGNOSIS — R7303 Prediabetes: Secondary | ICD-10-CM

## 2017-05-28 MED ORDER — ONETOUCH DELICA LANCETS 33G MISC: 11 refills | Status: AC

## 2017-05-28 MED ORDER — ONETOUCH ULTRA MINI W/DEVICE KIT: PACK | 0 refills | Status: AC

## 2017-05-28 MED ORDER — GLUCOSE BLOOD VI STRP: ORAL_STRIP | 11 refills | Status: AC

## 2017-05-28 NOTE — Telephone Encounter (Signed)
Pharmacy sent over request for diabetic supplies. MPulliam, CMA/RT(R)

## 2017-06-12 DIAGNOSIS — M47817 Spondylosis without myelopathy or radiculopathy, lumbosacral region: Secondary | ICD-10-CM | POA: Diagnosis not present

## 2017-06-17 DIAGNOSIS — M47816 Spondylosis without myelopathy or radiculopathy, lumbar region: Secondary | ICD-10-CM | POA: Diagnosis not present

## 2017-06-17 DIAGNOSIS — M545 Low back pain: Secondary | ICD-10-CM | POA: Diagnosis not present

## 2017-06-17 DIAGNOSIS — G894 Chronic pain syndrome: Secondary | ICD-10-CM | POA: Diagnosis not present

## 2017-07-02 ENCOUNTER — Encounter: Payer: Self-pay | Admitting: Family Medicine

## 2017-07-02 ENCOUNTER — Ambulatory Visit: Payer: Medicare Other

## 2017-07-02 ENCOUNTER — Ambulatory Visit (INDEPENDENT_AMBULATORY_CARE_PROVIDER_SITE_OTHER): Payer: Medicare Other | Admitting: Family Medicine

## 2017-07-02 ENCOUNTER — Ambulatory Visit (HOSPITAL_COMMUNITY): Payer: Medicare Other

## 2017-07-02 VITALS — BP 133/84 | HR 70 | Ht 72.0 in | Wt 352.0 lb

## 2017-07-02 DIAGNOSIS — G8929 Other chronic pain: Secondary | ICD-10-CM

## 2017-07-02 DIAGNOSIS — M19042 Primary osteoarthritis, left hand: Secondary | ICD-10-CM | POA: Diagnosis not present

## 2017-07-02 DIAGNOSIS — M159 Polyosteoarthritis, unspecified: Secondary | ICD-10-CM

## 2017-07-02 DIAGNOSIS — M79645 Pain in left finger(s): Secondary | ICD-10-CM

## 2017-07-02 DIAGNOSIS — M1812 Unilateral primary osteoarthritis of first carpometacarpal joint, left hand: Secondary | ICD-10-CM | POA: Insufficient documentation

## 2017-07-02 DIAGNOSIS — M545 Low back pain: Secondary | ICD-10-CM | POA: Diagnosis not present

## 2017-07-02 DIAGNOSIS — R7303 Prediabetes: Secondary | ICD-10-CM

## 2017-07-02 LAB — POCT GLYCOSYLATED HEMOGLOBIN (HGB A1C): Hemoglobin A1C: 6.5

## 2017-07-02 MED ORDER — MELOXICAM 15 MG PO TABS: 15.0000 mg | ORAL_TABLET | Freq: Every day | ORAL | 0 refills | Status: DC

## 2017-07-02 NOTE — Progress Notes (Signed)
Impression and Recommendations:    1. Prediabetes: new onset 04/2016   2. Morbid obesity BMI >er 48   3. Chronic low back pain, unspecified back pain laterality, with sciatica presence unspecified   4. Chronic thumb pain, left   5. Generalized osteoarthritis    We would do a trial of the meloxicam to see if that will help with your generalized arthritis in your especially your left thumb, knees, ankles, back etc.  Please take this first thing in the morning when you awake.  12 hours later you can take the tramadol if you would like at night.  Please see if this helps with your generalized arthritic pains a little more and help to move throughout the day a little more.  I only gave you 30 of the meloxicam see you can see if it helps you.  Over the next 2-3 weeks please email [place] or call us and talk to [facility] about if it is helping or not and we could just refill it for you.   -Please ice your thumb 1520 minutes 3-4 times a day to help with pain  1. Continue metformin at 500 twice daily.  A1c 6.5 today.  Patient knows we will recheck in 3 months and if it is not under 6.5 we will change diagnosis to diabetes and adjust meds accordingly if needed. 2. Discussed weight loss.  Discussed prudent diet.  Patient will try to become more active as his ankle heals.  He will follow-up with sports med. 3. Continue his tramadol as needed for back pain from his back doctor.  He will take it just at night and try the meloxicam for his generalized osteoarthritis and multiple sites for daytime pain relief and mobility.  We will give only 30 and patient will let us know in the next 2-3 weeks if this is working well for him then we will refill. 4. For left thumb pain, will obtain x-ray today.  Likely to show degenerative joint disease.  Will call patient if it shows anything other than that. 5.   Orders Placed This Encounter  Procedures  . DG Finger Thumb Left  . DG Hand Complete Left  . POCT  glycosylated hemoglobin (Hb A1C)    Gross side effects, risk and benefits, and alternatives of medications and treatment plan in general discussed with patient.  Patient is aware that all medications have potential side effects and we are unable to predict every side effect or drug-drug interaction that may occur.   Patient will call with any questions prior to using medication if they have concerns.  Expresses verbal understanding and consents to current therapy and treatment regimen.  No barriers to understanding were identified.  Red flag symptoms and signs discussed in detail.  Patient expressed understanding regarding what to do in case of emergency\urgent symptoms  Please see AVS handed out to patient at the end of our visit for further patient instructions/ counseling done pertaining to today's office visit.   Return for 36-monthfollow-up blood sugar/ A1c etc..    Note: This note was prepared with assistance of Dragon voice recognition software. Occasional wrong-word or sound-a-like substitutions may have occurred due to the inherent limitations of voice recognition software.    I have reviewed the above documentation for accuracy and completeness, and I agree with the above.   DMellody Dance01/14/19 8:12 AM  --------------------------------------------------------------------------------------------------------------------------------------------------------------------------------------------------------------------------------------------    Subjective:     HPI: Robert [name] a 61y.o.  male who presents [facility] today for issues as discussed below.   L ankle pain - seeing Dr Raynelle Bring of [facility]- getting nitroglycerin patches for his ankle to help his tendon heel which increases blood flow.  Feeling well.  Does feel is helping and he is slowly getting better.  Pre-DM:    Taking metformin.  No s-e.   A1c 6.2 on 03/28/17 and started  metformin 3-4 months now.  Aic today went up to 6.5.   Patient does admit he is probably been not eating as healthy over the holidays-lots more desserts and rich foods.   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  It aches him especially first thing in the morning when he awakes it feels stiff and swollen and painful.  As he gets it moving and playing guitar for 2-3 hours a day it eventually loosens and feels like it is moving better and less pain, but after using it playuing for couple hrs per day, later in the day once he gets stiff again, it will hurt again.  Uses tramadol for his chronic back pain.    Wt Readings from Last 3 Encounters:  07/03/17 (!) 342 lb (155.1 kg)  07/02/17 (!) 352 lb (159.7 kg)  05/22/17 (!) 340 lb (154.2 kg)   BP Readings from Last 3 Encounters:  07/03/17 140/83  07/02/17 133/84  05/22/17 137/85   Pulse Readings from Last 3 Encounters:  07/02/17 70  05/09/17 67  03/28/17 64   BMI Readings from Last 3 Encounters:  07/03/17 45.75 kg/m  07/02/17 47.74 kg/m  05/22/17 46.76 kg/m     Patient Care Team    Relationship Specialty Notifications Start End  [name], DO PCP - General Family Medicine  02/20/16   Artist Pais, MD  Orthopedic Surgery  04/11/16   Dian Situ, MD  Pain Medicine  04/11/16   Tanda Rockers, MD Consulting Physician Pulmonary Disease  04/11/16    Comment: Obstructive sleep apnea, restrictive lung disease due to severe morbid obesity  Awilda Metro, PA-C  Pain Medicine  07/23/17      Patient Active Problem List   Diagnosis Date Noted  . Vitamin D deficiency 05/08/2016    Priority: High  . Prediabetes: new onset 04/2016 05/08/2016    Priority: High  . Bilateral lower extremity pitting edema 04/11/2016    Priority: High  . Restrictive lung disease secondary  to obesity 04/11/2016    Priority: High  . Morbid obesity BMI >er 48 02/15/2012    Priority: High  . Chronic venous stasis dermatitis of both lower extremities 05/08/2016    Priority: Medium  . OSA on CPAP- seen Dr Melvyn Novas in past 04/11/2016    Priority: Medium  . Physical deconditioning 04/11/2016    Priority: Medium  . h/o Depressive disorder 04/11/2016    Priority: Medium  . History of tobacco use 04/11/2016    Priority: Low  . Postlaminectomy syndrome, lumbar region 01/04/2012    Priority: Low  . h/o Narcotic abuse 01/04/2012    Priority: Low  . Constipation 09/22/2011    Priority: Low  . Chronic back pain 09/22/2011    Priority: Low  . Arthritis of carpometacarpal [facility]) joint of left thumb 07/02/2017  . Bilateral leg cramps 05/09/2017  . acute  Left heel pain 03/28/2017  . Tick bite 02/11/2017  . Barrett's esophagus 12/07/2016  . Chronic pain of left heel 11/27/2016  . Lumbar radiculopathy 07/21/2012  . Chronic pain due to injury 07/21/2012  . Back pain 01/04/2012  . Dyspnea 11/27/2011  . Confusion 09/22/2011  . Abdominal pain 09/22/2011  . Dehydration 09/22/2011  . Nausea & vomiting 09/22/2011    Past Medical history, Surgical history, Family history, Social history, Allergies and Medications have been entered into the medical record, reviewed and changed as needed.    Current Meds  Medication Sig  . albuterol (PROVENTIL HFA;VENTOLIN HFA) 108 (90 Base) MCG/ACT inhaler Inhale 2 puffs into the lungs every 6 (six) hours as needed for wheezing or shortness of breath.  . APPLE CIDER VINEGAR PO Take by mouth.  . B Complex Vitamins (B COMPLEX 100 PO) Take 1 capsule by mouth daily.  . blood glucose meter kit and supplies KIT Dispense based on patient and insurance preference. Use up to four times daily as directed. (FOR ICD-9 250.00, 250.01).  . Blood Glucose Monitoring Suppl (ONE TOUCH ULTRA MINI) w/Device KIT Use to test blood sugar, test in the morning fasting and test  after largest meal of the day.  . Cholecalciferol (VITAMIN D3) 5000 units TABS 5,000 IU OTC vitamin D3 daily.  Marland Kitchen glucose blood (ONE TOUCH ULTRA TEST) test strip Use to test blood sugar, test in the morning fasting and test after largest meal of the day.  . ibuprofen (ADVIL,MOTRIN) 200 MG tablet Take 200 mg by mouth every 6 (six) hours as needed for fever or mild pain.  . metFORMIN (GLUCOPHAGE) 500 MG tablet Take 1 tablet (500 mg total) by mouth 2 (two) times daily with a meal.  . Misc Natural Products (GINSENG COMPLEX PO) Take by mouth.  Glory Rosebush DELICA LANCETS 67H MISC Use for testing blood sugar, test once in the morning fasting and test after largest meal of the day.  . Potassium 75 MG TABS Take 1 tablet by mouth daily at 8 pm.  . traMADol (ULTRAM) 50 MG tablet Take 50 mg by mouth 3 (three) times daily. For pain  . Vitamin D, Ergocalciferol, (DRISDOL) 50000 units CAPS capsule Take 1 capsule (50,000 Units total) by mouth every 7 (seven) days.  Marland Kitchen zinc gluconate 50 MG tablet Take 50 mg by mouth daily.  . [DISCONTINUED] cyclobenzaprine (FLEXERIL) 10 MG tablet Take 1 tablet by mouth 2 (two) times daily.  . [DISCONTINUED] nitroGLYCERIN (NITRODUR - DOSED IN MG/24 HR) 0.2 mg/hr patch Place 1/4 patch on affected area daily.    Allergies:  No Known Allergies   Review of Systems:  A fourteen system review of systems was performed and found to be positive as per HPI.   Objective:   Blood pressure 133/84, pulse 70, height 6' (1.829 m), weight (!) 352 lb (159.7 kg), SpO2 98 %. Body mass index is 47.74 kg/m. General:  Well Developed, well nourished, appropriate for stated age.  Neuro:  Alert and oriented,  extra-ocular muscles intact  HEENT:  Normocephalic, atraumatic, neck supple, no carotid bruits appreciated  Skin:  no gross rash, warm, pink. Cardiac:  RRR, S1 S2 Respiratory:  ECTA B/L and A/P, Not using accessory muscles, speaking in full sentences- unlabored.  GEN: WDWN, NAD, Non-toxic,  Alert & Oriented x 3 HEENT: Atraumatic, Normocephalic.  Ears and Nose: No external deformity. EXTR: No clubbing/cyanosis/edema NEURO: Normal gait.  PSYCH: Normally interactive. Conversant. Not depressed or anxious appearing.  Calm demeanor.   L  hand Ecchymosis or edema:  Neg except for around the first MCP joint. ROM wrist/hand/digits:  full  Carpals, MCP's, digits:  -First MCP joint swollen, hard and calcifications likely.  Thenar eminence more prominent versus right due to overuse. Distal Ulna and Radius:  NT No instability Cysts/nodules:  neg Digit triggering:  neg Finkelstein's test:  neg Snuffbox tenderness:  neg Resisted supination/ pronation:  NT Full fist; Grip, all digits: 5/5 strength DIPJT:  NT PIP JT:  NT MCP JT:  TTP No tenosynovitis Atrophy: neg Hand sensation: intact NVI distally Vascular:  Ext warm, no cyanosis apprec.; cap RF less 2 sec. Psych:  No HI/SI, judgement and insight good, Euthymic mood. Full Affect.

## 2017-07-02 NOTE — Patient Instructions (Addendum)
We would do a trial of the meloxicam to see if that will help with your generalized arthritis in your especially your left thumb, knees, ankles, back etc.  Please take this first thing in the morning when you awake.  12 hours later you can take the tramadol if you would like at night.  Please see if this helps with your generalized arthritic pains a little more and help to move throughout the day a little more.  I only gave you 30 of the meloxicam see you can see if it helps you.  Over the next 2-3 weeks please email us or call [facility] and [facility] if it is helping or not and we could just refill it for you.   -Please ice your thumb 1520 minutes 3-4 times a day to help with pain

## 2017-07-03 ENCOUNTER — Encounter: Payer: Self-pay | Admitting: Family Medicine

## 2017-07-03 ENCOUNTER — Ambulatory Visit (INDEPENDENT_AMBULATORY_CARE_PROVIDER_SITE_OTHER): Payer: Medicare Other | Admitting: Family Medicine

## 2017-07-03 VITALS — BP 140/83 | Ht 72.5 in | Wt 342.0 lb

## 2017-07-03 DIAGNOSIS — M1812 Unilateral primary osteoarthritis of first carpometacarpal joint, left hand: Secondary | ICD-10-CM

## 2017-07-03 DIAGNOSIS — M7662 Achilles tendinitis, left leg: Secondary | ICD-10-CM | POA: Diagnosis not present

## 2017-07-03 MED ORDER — NITROGLYCERIN 0.2 MG/HR TD PT24: MEDICATED_PATCH | TRANSDERMAL | 0 refills | Status: DC

## 2017-07-03 NOTE — Progress Notes (Signed)
Chief complaint: Follow up of left Achilles tendinosis new problem of left thumb pain times "many years"  History of present illness: Annette StableBill is a 61 year old male who presents to the sports medicine office today for follow-up of left Achilles insertional tendinosis. He was last here in the office about 6 weeks ago back on 05/22/17. He reports today that he has been having some slow interval improvement in his symptoms. He reports noticing the pain when he is dorsiflexing his foot extremely at the calcaneal border. He has been using the nitroglycerin patch on daily basis which he reports has helped. He has also been doing the home exercise program on daily basis. No interval injury or trauma. No numbness, tingling, or burning paresthesias.  He also has to talk about today his left thumb pain. He reports this is been ongoing for several years, but has acutely worsened over the last 2 weeks. He is not report of any specific inciting incident, trauma, or injury to explain the pain. 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 He does use his left hand to grip the strings on the guitar. He reports pain and stiffness in his thumb. He also reports noticing when he extends his thumb having pain. He reports that he did see his primary care physician for this yesterday. She did prescribe him meloxicam 15 mg daily and ordered an x-ray of his left hand. He asks today about the results and what he needs to do in regards to do that. He does not report of any weakness with grip strength. He does not report of any numbness, tingling, or burning paresthesias. He is right-hand dominant. No previous injury to his left hand, finger, wrist. He has not been using any medications otherwise. He does not report of any radiation of pain, describes it as a throbbing pain at the base of the thumb.  Review of systems:  As stated above  Interval past medical history, surgical history, family history, and social  history obtained and unchanged.  Physical exam: Vital signs are reviewed and are documented in the chart Gen.: Alert, oriented, appears stated age, in no apparent distress HEENT: Moist oral mucosa Respiratory: Normal respirations, able to speak in full sentences Cardiac: Regular rate, distal pulses 2+, 1+ pitting edema bilaterally in bilateral feet and ankles Integumentary: No rashes on visible skin:  Neurologic: Strength 5/5, sensation 2+ in bilateral upper and lower extremities Psych: Normal affect, mood is described as good Musculoskeletal: Inspection of left foot reveals no obvious deformity or muscle atrophy, no warmth, erythema, or ecchymosis, pitting edema as noted above, he does have slight tenderness to palpation at the Achilles tendon insertion on the calcaneus and at the pre-calcaneal bursa, no mid substance tenderness to palpation over the Achilles tendon, he does have full range of motion at the ankle, slight elicitation of pain with extreme amount of dorsiflexion, no pain with plantar flexion, inversion, eversion, anterior drawer and talar tilt negative; inspection of left thumb reveals that he does have slight anterior subluxation of the left thumb, otherwise no obvious deformity or muscle atrophy, no warmth, erythema, ecchymosis, or effusion, he is tender to palpation along the radial aspect of the left thumb [facility] the [facility], no tenderness over the MCP or IP joint, he does have full finger and thumb range of motion, normal thumb opposition and adduction, does report of pain with resisted thumb extension, with both isolating and not isolating the IP joint, he is able to  clench his hand into a fist, grind test equivocally positive, Finkelstein negative   Assessment and plan: 1. Left chronic Achilles insertional tendinopathy 2. 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 Discussed with him to continue doing home exercise program on daily basis, including eccentric strengthening exercises. Also discussed continue with the nitroglycerin patch. I discussed with him that he does have some swelling diffusely in his foot and ankle. I discussed with him to have a conversation with his primary physician regarding this and if being started on a diuretic, such as Lasix, is right for him. I will defer any decisions on this to his primary physician, This may decrease pressure on his feet and subsequently his Achilles tendon. In regards to his left thumb pain, I did discuss with him that he does have severe first CMC osteoarthritis, with subsequent anterior subluxation due to this. 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 I pulled up the image in the room with him today and discussed the findings on the x-ray. Since he just started a meloxicam yesterday, I discussed reasonable option would be to take this scheduled for the next 10-14 days and see how this does. I also discussed using topical creams such as Aspercreme. Discussed if he is not in any better or has worsening of pain, an option would be an ultrasound-guided cortisone injection to the [facility]. He is in agreeance with this plan. He will otherwise continue the tramadol as prescribed by his spine physician for his back. He will follow up here in 4 weeks or sooner as needed.   Haynes Kernshristopher Flavius Repsher, M.D. Primary Care Sports Medicine Fellow [facility]

## 2017-07-18 DIAGNOSIS — G894 Chronic pain syndrome: Secondary | ICD-10-CM | POA: Diagnosis not present

## 2017-07-18 DIAGNOSIS — Z79899 Other long term (current) drug therapy: Secondary | ICD-10-CM | POA: Diagnosis not present

## 2017-07-18 DIAGNOSIS — M545 Low back pain: Secondary | ICD-10-CM | POA: Diagnosis not present

## 2017-07-18 DIAGNOSIS — M47816 Spondylosis without myelopathy or radiculopathy, lumbar region: Secondary | ICD-10-CM | POA: Diagnosis not present

## 2017-07-18 DIAGNOSIS — Z79891 Long term (current) use of opiate analgesic: Secondary | ICD-10-CM | POA: Diagnosis not present

## 2017-07-22 ENCOUNTER — Other Ambulatory Visit: Payer: Self-pay

## 2017-07-22 ENCOUNTER — Telehealth: Payer: Self-pay | Admitting: Family Medicine

## 2017-07-22 DIAGNOSIS — M159 Polyosteoarthritis, unspecified: Secondary | ICD-10-CM

## 2017-07-22 DIAGNOSIS — G8929 Other chronic pain: Secondary | ICD-10-CM

## 2017-07-22 DIAGNOSIS — M79645 Pain in left finger(s): Principal | ICD-10-CM

## 2017-07-22 NOTE — Telephone Encounter (Signed)
Sent request to Dr. Sharee Holsterpalski for the Flexeril. MPulliam, CMA/RT(R)

## 2017-07-22 NOTE — Telephone Encounter (Signed)
Patient  Called states Dr. O gv sample of :  meloxicam (MOBIC) 15 MG tabVal Eaglelet [[id number]][[id number]]  Order Details  Dose: 15 mg Route: Oral Frequency: Daily  Dispense Quantity: 30 tablet Refills: 0 Fills remaining: --        Sig: Take 1 tablet (15 mg total) by mouth daily    --Patient states it is working very well and he would like a refill if possible.  --Patient also states spoke w/ Dr. Val Eagle about cyclobenzaprine/Flexeril 10MG  (back surgeon prescribes) he now request Dr.O to be the prescriber/follower and wishes her to call him regarding the switch. At [phone]

## 2017-07-22 NOTE — Telephone Encounter (Signed)
Patient is requesting refill on Mobic - per last office note okay to refill if working.  Patient states that the medication is working very well.  Patient is also requesting a refill on Flexeril, last prescribed by a previous provider there sent request to Dr. Sharee Holsterpalski to review for refill on this medication. MPulliam, CMA/RT(R)

## 2017-07-23 MED ORDER — CYCLOBENZAPRINE HCL 10 MG PO TABS: 10.0000 mg | ORAL_TABLET | Freq: Two times a day (BID) | ORAL | 1 refills | Status: DC

## 2017-07-23 MED ORDER — MELOXICAM 15 MG PO TABS: 15.0000 mg | ORAL_TABLET | Freq: Every day | ORAL | 1 refills | Status: DC

## 2017-07-23 NOTE — Telephone Encounter (Signed)
Ok to [employer].

## 2017-07-31 ENCOUNTER — Ambulatory Visit: Payer: Medicare Other | Admitting: Family Medicine

## 2017-08-03 ENCOUNTER — Other Ambulatory Visit: Payer: Self-pay | Admitting: Family Medicine

## 2017-08-07 ENCOUNTER — Encounter: Payer: Self-pay | Admitting: Family Medicine

## 2017-08-07 ENCOUNTER — Ambulatory Visit (INDEPENDENT_AMBULATORY_CARE_PROVIDER_SITE_OTHER): Payer: Medicare Other | Admitting: Family Medicine

## 2017-08-07 VITALS — BP 163/76 | HR 81 | Ht 72.0 in | Wt 343.0 lb

## 2017-08-07 DIAGNOSIS — M79645 Pain in left finger(s): Secondary | ICD-10-CM | POA: Diagnosis not present

## 2017-08-07 DIAGNOSIS — M7662 Achilles tendinitis, left leg: Secondary | ICD-10-CM | POA: Diagnosis not present

## 2017-08-07 NOTE — Progress Notes (Signed)
Chief complaint: Follow up of left insertional Achilles tendinosis and left thumb pain times "many years"  History of present illness: Robert [name] is a 62 year old male presents to sports medicine office today for follow-up of left Achilles insertional tendinosis as well as severe left first CMC osteoarthritis.  Last office visit here was back on 07/03/17.  Fortunately, both these problems are much improved today.  He was here back last month, he was just a day into starting meloxicam.  Fortunately, the meloxicam has made much improvement in both of his painful areas.  Reports today only noticing a slight throbbing pain at the distal Achilles insertion on calcaneus when walking for prolonged period time.  He reports that he has been trying to be consistent with Achilles eccentric strengthening exercises.  He has been using the nitroglycerin patch on daily basis still.  He also been using the heel lift in his shoes.  In regards to his left thumb pain, symptoms are much improved since being on scheduled meloxicam.  He does not report pain with thumb flexion or thumb extension like he did previously.  Able to play the guitar with no issues right now.  He is otherwise using tramadol intermittently for breakthrough pain.  This is being prescribed by his spine physician.  No interval injury or trauma.  Review of systems:  As stated above  Interval past medical history, surgical history, family history, and social history obtained and unchanged.  Physical exam: Vital signs are reviewed and are documented in the chart Gen.: Alert, oriented, appears stated age, in no apparent distress HEENT: Moist oral mucosa Respiratory: Normal respirations, able to speak in full sentences Cardiac: Regular rate, distal pulses 2+ Integumentary: No rashes on visible skin:  Neurologic: Strength 5/5, sensation 2+ Psych: Normal affect, mood is described as good Musculoskeletal: Inspection of left foot reveals no obvious deformity  or muscle atrophy, no warmth, erythema, or ecchymosis, pitting edema is still present today, no tenderness to palpation today at the Achilles tendon insertion on the calcaneus and at the retroachilles bursa, no mid substance tenderness to palpation over the Achilles tendon, he does have full range of motion at the ankle, no pain with dorsiflexion, plantarflexion, inversion, and eversion, anterior drawer and talar tilt negative; inspection of left thumb reveals that he does have slight anterior subluxation of the left thumb, otherwise no obvious deformity or muscle atrophy, no warmth, erythema, ecchymosis, or effusion, no tenderness to palpation today along the radial aspect of the left thumb at the [facility], no tenderness over the MCP or IP joint, he does have full finger and thumb range of motion, normal thumb opposition and adduction, does report of pain with resisted thumb extension, with both isolating and not isolating the IP joint, he is able to clench his hand into a fist, grind test negative, Finkelstein negative  Assessment and plan: 1. Left chronic Achilles insertional tendinopathy, with symptom improvement 2. 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  In regards to his Achilles pain, discussed to continue wearing the heel lifts and doing eccentric strengthening exercises. He did get a new pair of shoes and asks if he can get another pair of heel lifts. Did provide him this.  In regards to the nitroglycerin therapy, discussed will have him use the patch on daily basis for the next month, then wean down to every other day for the following  month, then discontinue it completely afterwards.  In regards to his thumb pain, will have him use meloxicam as needed for pain.  If he is having worsening of symptoms and the meloxicam is not helping, discussed next step would be  ultrasound-guided cortisone injection into the first [facility] joint.  Will plan to have him return in 2 months for follow-up, with plan for repeat ultrasound of his left Achilles at that time.  Otherwise, will have him return sooner on as-needed basis.   Haynes Kerns, M.D. Primary Care Sports Medicine Fellow [facility]

## 2017-08-19 DIAGNOSIS — M47816 Spondylosis without myelopathy or radiculopathy, lumbar region: Secondary | ICD-10-CM | POA: Diagnosis not present

## 2017-08-19 DIAGNOSIS — Z79899 Other long term (current) drug therapy: Secondary | ICD-10-CM | POA: Diagnosis not present

## 2017-08-19 DIAGNOSIS — M545 Low back pain: Secondary | ICD-10-CM | POA: Diagnosis not present

## 2017-08-19 DIAGNOSIS — G894 Chronic pain syndrome: Secondary | ICD-10-CM | POA: Diagnosis not present

## 2017-08-19 DIAGNOSIS — Z79891 Long term (current) use of opiate analgesic: Secondary | ICD-10-CM | POA: Diagnosis not present

## 2017-09-11 DIAGNOSIS — M542 Cervicalgia: Secondary | ICD-10-CM | POA: Diagnosis not present

## 2017-09-11 DIAGNOSIS — M5116 Intervertebral disc disorders with radiculopathy, lumbar region: Secondary | ICD-10-CM | POA: Diagnosis not present

## 2017-09-11 DIAGNOSIS — M9905 Segmental and somatic dysfunction of pelvic region: Secondary | ICD-10-CM | POA: Diagnosis not present

## 2017-09-11 DIAGNOSIS — M9901 Segmental and somatic dysfunction of cervical region: Secondary | ICD-10-CM | POA: Diagnosis not present

## 2017-09-11 DIAGNOSIS — M461 Sacroiliitis, not elsewhere classified: Secondary | ICD-10-CM | POA: Diagnosis not present

## 2017-09-11 DIAGNOSIS — M9904 Segmental and somatic dysfunction of sacral region: Secondary | ICD-10-CM | POA: Diagnosis not present

## 2017-09-15 ENCOUNTER — Other Ambulatory Visit: Payer: Self-pay | Admitting: Family Medicine

## 2017-09-15 DIAGNOSIS — M159 Polyosteoarthritis, unspecified: Secondary | ICD-10-CM

## 2017-09-16 DIAGNOSIS — Z79891 Long term (current) use of opiate analgesic: Secondary | ICD-10-CM | POA: Diagnosis not present

## 2017-09-16 DIAGNOSIS — M545 Low back pain: Secondary | ICD-10-CM | POA: Diagnosis not present

## 2017-09-16 DIAGNOSIS — G894 Chronic pain syndrome: Secondary | ICD-10-CM | POA: Diagnosis not present

## 2017-09-16 DIAGNOSIS — M47816 Spondylosis without myelopathy or radiculopathy, lumbar region: Secondary | ICD-10-CM | POA: Diagnosis not present

## 2017-09-18 DIAGNOSIS — M9901 Segmental and somatic dysfunction of cervical region: Secondary | ICD-10-CM | POA: Diagnosis not present

## 2017-09-18 DIAGNOSIS — M9905 Segmental and somatic dysfunction of pelvic region: Secondary | ICD-10-CM | POA: Diagnosis not present

## 2017-09-18 DIAGNOSIS — M542 Cervicalgia: Secondary | ICD-10-CM | POA: Diagnosis not present

## 2017-09-18 DIAGNOSIS — M9904 Segmental and somatic dysfunction of sacral region: Secondary | ICD-10-CM | POA: Diagnosis not present

## 2017-09-18 DIAGNOSIS — M461 Sacroiliitis, not elsewhere classified: Secondary | ICD-10-CM | POA: Diagnosis not present

## 2017-09-18 DIAGNOSIS — M5116 Intervertebral disc disorders with radiculopathy, lumbar region: Secondary | ICD-10-CM | POA: Diagnosis not present

## 2017-09-22 ENCOUNTER — Other Ambulatory Visit: Payer: Self-pay | Admitting: Family Medicine

## 2017-09-22 DIAGNOSIS — M159 Polyosteoarthritis, unspecified: Secondary | ICD-10-CM

## 2017-09-22 DIAGNOSIS — G8929 Other chronic pain: Secondary | ICD-10-CM

## 2017-09-22 DIAGNOSIS — M79645 Pain in left finger(s): Principal | ICD-10-CM

## 2017-09-25 DIAGNOSIS — M461 Sacroiliitis, not elsewhere classified: Secondary | ICD-10-CM | POA: Diagnosis not present

## 2017-09-25 DIAGNOSIS — M9901 Segmental and somatic dysfunction of cervical region: Secondary | ICD-10-CM | POA: Diagnosis not present

## 2017-09-25 DIAGNOSIS — M542 Cervicalgia: Secondary | ICD-10-CM | POA: Diagnosis not present

## 2017-09-25 DIAGNOSIS — M9904 Segmental and somatic dysfunction of sacral region: Secondary | ICD-10-CM | POA: Diagnosis not present

## 2017-09-25 DIAGNOSIS — M9905 Segmental and somatic dysfunction of pelvic region: Secondary | ICD-10-CM | POA: Diagnosis not present

## 2017-09-25 DIAGNOSIS — M5116 Intervertebral disc disorders with radiculopathy, lumbar region: Secondary | ICD-10-CM | POA: Diagnosis not present

## 2017-09-30 ENCOUNTER — Encounter: Payer: Self-pay | Admitting: Family Medicine

## 2017-09-30 ENCOUNTER — Ambulatory Visit (INDEPENDENT_AMBULATORY_CARE_PROVIDER_SITE_OTHER): Payer: Medicare Other | Admitting: Family Medicine

## 2017-09-30 VITALS — BP 131/73 | HR 73 | Ht 72.0 in | Wt 354.0 lb

## 2017-09-30 DIAGNOSIS — E782 Mixed hyperlipidemia: Secondary | ICD-10-CM | POA: Diagnosis not present

## 2017-09-30 DIAGNOSIS — E118 Type 2 diabetes mellitus with unspecified complications: Secondary | ICD-10-CM | POA: Diagnosis not present

## 2017-09-30 DIAGNOSIS — I10 Essential (primary) hypertension: Secondary | ICD-10-CM | POA: Diagnosis not present

## 2017-09-30 DIAGNOSIS — E1159 Type 2 diabetes mellitus with other circulatory complications: Secondary | ICD-10-CM | POA: Insufficient documentation

## 2017-09-30 DIAGNOSIS — L089 Local infection of the skin and subcutaneous tissue, unspecified: Secondary | ICD-10-CM | POA: Insufficient documentation

## 2017-09-30 DIAGNOSIS — R7303 Prediabetes: Secondary | ICD-10-CM

## 2017-09-30 DIAGNOSIS — E11628 Type 2 diabetes mellitus with other skin complications: Secondary | ICD-10-CM | POA: Insufficient documentation

## 2017-09-30 DIAGNOSIS — I152 Hypertension secondary to endocrine disorders: Secondary | ICD-10-CM

## 2017-09-30 DIAGNOSIS — E1169 Type 2 diabetes mellitus with other specified complication: Secondary | ICD-10-CM

## 2017-09-30 LAB — POCT GLYCOSYLATED HEMOGLOBIN (HGB A1C): Hemoglobin A1C: 7

## 2017-09-30 MED ORDER — METFORMIN HCL 500 MG PO TABS: 1000.0000 mg | ORAL_TABLET | Freq: Two times a day (BID) | ORAL | 3 refills | Status: DC

## 2017-09-30 MED ORDER — ATORVASTATIN CALCIUM 20 MG PO TABS: 20.0000 mg | ORAL_TABLET | Freq: Every day | ORAL | 0 refills | Status: DC

## 2017-09-30 MED ORDER — OLMESARTAN-AMLODIPINE-HCTZ 20-5-12.5 MG PO TABS: 0.5000 | ORAL_TABLET | Freq: Every day | ORAL | 0 refills | Status: DC

## 2017-09-30 NOTE — Patient Instructions (Addendum)
I want you to check your fasting sugars that your blood sugar first thing in the morning when you wake up.  Write it down also, if you want to check another time a day it would only be 2 hours after your largest meal.  It makes no sense to check your blood sugar randomly throughout the day as is not interpretable unless you feel poorly or badly then check it  -For your blood pressure please make sure you are sitting quietly for 15-20 minutes with both feet flat on the ground you have not eaten anything spicy or caffeinated etc. prior and check it with the appropriate size cuff.  Write it down and bring it in next office visit  Double your metformin first to 2 tabs in the morning and 2 tabs in the evening.  Then after a couple days you tolerate that start with the new blood pressure medicine every morning.  For the next week as long as you have no side effects and tolerate that well then start with the cholesterol medicine every evening before bedtime   Think about the Diabetes educator and whether or not you would like to see them=-just give [place] a call and let us know as we can put in the referral for you.   [email or web address]/living-[email or web address]-[email or web address]-[email or web address]=lwd-[email or web address]       Diabetes Mellitus and Standards of Medical Care  Managing diabetes (diabetes mellitus) can be complicated. Your diabetes treatment may be managed by a team of health care providers, including:  A diet and nutrition specialist (registered dietitian).  A nurse.  A certified diabetes educator (CDE).  A diabetes specialist (endocrinologist).  An eye doctor.  A primary care provider.  A dentist.  Your health care providers follow a schedule in order to help you get the best quality of care. The following schedule is a general guideline for your diabetes management plan. Your health care providers may also give you more specific instructions.  HbA1c (hemoglobin A1c) test This  test provides information about blood sugar (glucose) control over the previous 2-3 months. It is used to check whether your diabetes management plan needs to be adjusted.  If you are meeting your treatment goals, this test is done at least 2 times a year.  If you are not meeting treatment goals or if your treatment goals have changed, this test is done 4 times a year.  Blood pressure test  This test is done at every routine medical visit. For most people, the goal is less than 130/80. Ask your health care provider what your goal blood pressure should be.  Dental and eye exams  Visit your dentist two times a year.  If you have type 1 diabetes, get an eye exam 3-5 years after you are diagnosed, and then once a year after your first exam. ? If you were diagnosed with type 1 diabetes as a child, get an eye exam when you are age 40 or older and have had diabetes for 3-5 years. After the first exam, you should get an eye exam once a year.  If you have type 2 diabetes, have an eye exam as soon as you are diagnosed, and then once a year after your first exam.  Foot care exam  Visual foot exams are done at every routine medical visit. The exams check for cuts, bruises, redness, blisters, sores, or other problems with the feet.  A complete foot exam is done by your health care provider once a year. This exam includes  an inspection of the structure and skin of your feet, and a check of the pulses and sensation in your feet. ? Type 1 diabetes: Get your first exam 3-5 years after diagnosis. ? Type 2 diabetes: Get your first exam as soon as you are diagnosed.  Check your feet every day for cuts, bruises, redness, blisters, or sores. If you have any of these or other problems that are not healing, contact your health care provider.  Kidney function test (urine microalbumin)  This test is done once a year. ? Type 1 diabetes: Get your first test 5 years after diagnosis. ? Type 2 diabetes: Get your  first test as soon as you are diagnosed._  If you have chronic kidney disease (CKD), get a serum creatinine and estimated glomerular filtration rate (eGFR) test once a year.  Lipid profile (cholesterol, HDL, LDL, triglycerides)  This test should be done when you are diagnosed with diabetes, and every 5 years after the first test. If you are on medicines to lower your cholesterol, you may need to get this test done every year. ? The goal for LDL is less than 100 mg/dL (5.5 mmol/L). If you are at high risk, the goal is less than 70 mg/dL (3.9 mmol/L). ? The goal for HDL is 40 mg/dL (2.2 mmol/L) for men and 50 mg/dL(2.8 mmol/L) for women. An HDL cholesterol of 60 mg/dL (3.3 mmol/L) or higher gives some protection against heart disease. ? The goal for triglycerides is less than 150 mg/dL (8.3 mmol/L).  Immunizations  The yearly flu (influenza) vaccine is recommended for everyone 6 months or older who has diabetes.  The pneumonia (pneumococcal) vaccine is recommended for everyone 2 years or older who has diabetes. If you are 37 or older, you may get the pneumonia vaccine as a series of two separate shots.  The hepatitis B vaccine is recommended for adults shortly after they have been diagnosed with diabetes.  The Tdap (tetanus, diphtheria, and pertussis) vaccine should be given: ? According to normal childhood vaccination schedules, for children. ? Every 10 years, for adults who have diabetes.  The shingles vaccine is recommended for people who have had chicken pox and are 50 years or older.  Mental and emotional health  Screening for symptoms of eating disorders, anxiety, and depression is recommended at the time of diagnosis and afterward as needed. If your screening shows that you have symptoms (you have a positive screening result), you may need further evaluation and be referred to a mental health care provider.  Diabetes self-management education  Education about how to manage your  diabetes is recommended at diagnosis and ongoing as needed.  Treatment plan  Your treatment plan will be reviewed at every medical visit.  Summary  Managing diabetes (diabetes mellitus) can be complicated. Your diabetes treatment may be managed by a team of health care providers.  Your health care providers follow a schedule in order to help you get the best quality of care.  Standards of care including having regular physical exams, blood tests, blood pressure monitoring, immunizations, screening tests, and education about how to manage your diabetes.  Your health care providers may also give you more specific instructions based on your individual health.      Type 2 Diabetes Mellitus, Self Care, Adult Caring for yourself after you have been diagnosed with type 2 diabetes (type 2 diabetes mellitus) means keeping your blood sugar (glucose) under control with a balance of:  Nutrition.  Exercise.  Lifestyle changes.  Medicines or insulin, if necessary.  Support from your team of health care providers and others.  The following information explains what you need to know to manage your diabetes at home. What do I need to do to manage my blood glucose?  Check your blood glucose every day, as often as told by your health care provider.  Contact your health care provider if your blood glucose is above your target for 2 tests in a row.  Have your A1c (hemoglobin A1c) level checked at least two times a year, or as often as told by your health care provider. Your health care provider will set individualized treatment goals for you. Generally, the goal of treatment is to maintain the following blood glucose levels:  Before meals (preprandial): 80-130 mg/dL (4.4-7.2 mmol/L).  After meals (postprandial): below 180 mg/dL (10 mmol/L).  A1c level: less than 7%.  What do I need to know about hyperglycemia and hypoglycemia? What is hyperglycemia? Hyperglycemia, also called high blood  glucose, occurs when blood glucose is too high.Make sure you know the early signs of hyperglycemia, such as:  Increased thirst.  Hunger.  Feeling very tired.  Needing to urinate more often than usual.  Blurry vision.  What is hypoglycemia? Hypoglycemia, also called low blood glucose, occurswith a blood glucose level at or below 70 mg/dL (3.9 mmol/L). The risk for hypoglycemia increases during or after exercise, during sleep, during illness, and when skipping meals or not eating for a long time (fasting). It is important to know the symptoms of hypoglycemia and treat it right away. Always have a 15-gram rapid-acting carbohydrate snack with you to treat low blood glucose. Family members and close friends should also know the symptoms and should understand how to treat hypoglycemia, in case you are not able to treat yourself. What are the symptoms of hypoglycemia? Hypoglycemia symptoms can include:  Hunger.  Anxiety.  Sweating and feeling clammy.  Confusion.  Dizziness or feeling light-headed.  Sleepiness.  Nausea.  Increased heart rate.  Headache.  Blurry vision.  Seizure.  Nightmares.  Tingling or numbness around the mouth, lips, or tongue.  A change in speech.  Decreased ability to concentrate.  A change in coordination.  Restless sleep.  Tremors or shakes.  Fainting.  Irritability.  How do I treat hypoglycemia?  If you are alert and able to swallow safely, follow the 15:15 rule:  Take 15 grams of a rapid-acting carbohydrate. Rapid-acting options include: ? 1 tube of glucose gel. ? 3 glucose pills. ? 6-8 pieces of hard candy. ? 4 oz (120 mL) of fruit juice. ? 4 oz (120 mL) of regular (not diet) soda.  Check your blood glucose 15 minutes after you take the carbohydrate.  If the repeat blood glucose level is still at or below 70 mg/dL (3.9 mmol/L), take 15 grams of a carbohydrate again.  If your blood glucose level does not increase above 70  mg/dL (3.9 mmol/L) after 3 tries, seek emergency medical care.  After your blood glucose level returns to normal, eat a meal or a snack within 1 hour.  How do I treat severe hypoglycemia? Severe hypoglycemia is when your blood glucose level is at or below 54 mg/dL (3 mmol/L). Severe hypoglycemia is an emergency. Do not wait to see if the symptoms will go away. Get medical help right away. Call your local emergency services (911 in the [place] Do not drive yourself to the hospital. If you have severe hypoglycemia and you cannot eat or drink, you  may need an injection of glucagon. A family member or close friend should learn how to check your blood glucose and how to give you a glucagon injection. Ask your health care provider if you need to have an emergency glucagon injection kit available. Severe hypoglycemia may need to be treated in a hospital. The treatment may include getting glucose through an IV tube. You may also need treatment for the cause of your hypoglycemia. Can having diabetes put me at risk for other conditions? Having diabetes can put you at risk for other long-term (chronic) conditions, such as heart disease and kidney disease. Your health care provider may prescribe medicines to help prevent complications from diabetes. These medicines may include:  Aspirin.  Medicine to lower cholesterol.  Medicine to control blood pressure.  What else can I do to manage my diabetes? Take your diabetes medicines as told  If your health care provider prescribed insulin or diabetes medicines, take them every day.  Do not run out of insulin or other diabetes medicines that you take. Plan ahead so you always have these available.  If you use insulin, adjust your dosage based on how physically active you are and what foods you eat. Your health care provider will tell you how to adjust your dosage. Make healthy food choices  The things that you eat and drink affect your blood glucose and your  insulin dosage. Making good choices helps to control your diabetes and prevent other health problems. A healthy meal plan includes eating lean proteins, complex carbohydrates, fresh fruits and vegetables, low-fat dairy products, and healthy fats. Make an appointment to see a diet and nutrition specialist (registered dietitian) to help you create an eating plan that is right for you. Make sure that you:  Follow instructions from your health care provider about eating or drinking restrictions.  Drink enough fluid to keep your urine clear or pale yellow.  Eat healthy snacks between nutritious meals.  Track the carbohydrates that you eat. Do this by reading food labels and learning the standard serving sizes of foods.  Follow your sick day plan whenever you cannot eat or drink as usual. Make this plan in advance with your health care provider.  Stay active  Exercise regularly, as told by your health care provider. This may include:  Stretching and doing strength exercises, such as yoga or weightlifting, at least 2 times a week.  Doing at least 150 minutes of moderate-intensity or vigorous-intensity exercise each week. This could be brisk walking, biking, or water aerobics. ? Spread out your activity over at least 3 days of the week. ? Do not go more than 2 days in a row without doing some kind of physical activity.  When you start a new exercise or activity, work with your health care provider to adjust your insulin, medicines, or food intake as needed. Make healthy lifestyle choices  Do not use any tobacco products, such as cigarettes, chewing tobacco, and e-cigarettes. If you need help quitting, ask your health care provider.  If your health care provider says that alcohol is safe for you, limit alcohol intake to no more than 1 drink per day for nonpregnant women and 2 drinks per day for men. One drink equals 12 oz of beer, 5 oz of wine, or 1 oz of hard liquor.  Learn to manage stress.  If you need help with this, ask your health care provider. Care for your body   Keep your immunizations up to date. In addition to  getting vaccinations as told by your health care provider, it is recommended that you get vaccinated against the following illnesses: ? The flu (influenza). Get a flu shot every year. ? Pneumonia. ? Hepatitis B.  Schedule an eye exam soon after your diagnosis, and then one time every year after that.  Check your skin and feet every day for cuts, bruises, redness, blisters, or sores. Schedule a foot exam with your health care provider once every year.  Brush your teeth and gums two times a day, and floss at least one time a day. Visit your dentist at least once every 6 months.  Maintain a healthy weight. General instructions  Take over-the-counter and prescription medicines only as told by your health care provider.  Share your diabetes management plan with people in your workplace, school, and household.  Check your urine for ketones when you are ill and as told by your health care provider.  Ask your health care provider: ? Do I need to meet with a diabetes educator? ? Where can I find a support group for people with diabetes?  Carry a medical alert card or wear medical alert jewelry.  Keep all follow-up visits as told by your health care provider. This is important. Where to find more information: For more information about diabetes, visit:  American Diabetes Association (ADA): www.diabetes.org  American Association of Diabetes Educators (AADE): www.diabeteseducator.org/patient-resources  This information is not intended to replace advice given to you by your health care provider. Make sure you discuss any questions you have with your health care provider. Document Released: 10/24/2015 Document Revised: 12/08/2015 Document Reviewed: 08/05/2015 Elsevier Interactive Patient Education  2017 [place].      Blood Glucose Monitoring,  Adult Monitoring your blood sugar (glucose) helps you manage your diabetes. It also helps you and your health care provider determine how well your diabetes management plan is working. Blood glucose monitoring involves checking your blood glucose as often as directed, and keeping a record (log) of your results over time. Why should I monitor my blood glucose? Checking your blood glucose regularly can:  Help you understand how food, exercise, illnesses, and medicines affect your blood glucose.  Let you know what your blood glucose is at any time. You can quickly tell if you are having low blood glucose (hypoglycemia) or high blood glucose (hyperglycemia).  Help you and your health care provider adjust your medicines as needed.  When should I check my blood glucose? Follow instructions from your health care provider about how often to check your blood glucose.   This may depend on:  The type of diabetes you have.  How well-controlled your diabetes is.  Medicines you are taking.  If you have type 1 diabetes:  Check your blood glucose at least 2 times a day.  Also check your blood glucose: ? Before every insulin injection. ? Before and after exercise. ? Between meals. ? 2 hours after a meal. ? Occasionally between 2:00 a.m. and 3:00 a.m., as directed. ? Before potentially dangerous tasks, like driving or using heavy machinery. ? At bedtime.  You may need to check your blood glucose more often, up to 6-10 times a day: ? If you use an insulin pump. ? If you need multiple daily injections (MDI). ? If your diabetes is not well-controlled. ? If you are ill. ? If you have a history of severe hypoglycemia. ? If you have a history of not knowing when your blood glucose is getting low (hypoglycemia unawareness).  If you have type 2 diabetes:  If you take insulin or other diabetes medicines, check your blood glucose at least 2 times a day.  If you are on intensive insulin therapy,  check your blood glucose at least 4 times a day. Occasionally, you may also need to check between 2:00 a.m. and 3:00 a.m., as directed.  Also check your blood glucose: ? Before and after exercise. ? Before potentially dangerous tasks, like driving or using heavy machinery.  You may need to check your blood glucose more often if: ? Your medicine is being adjusted. ? Your diabetes is not well-controlled. ? You are ill.  What is a blood glucose log?  A blood glucose log is a record of your blood glucose readings. It helps you and your health care provider: ? Look for patterns in your blood glucose over time. ? Adjust your diabetes management plan as needed.  Every time you check your blood glucose, write down your result and notes about things that may be affecting your blood glucose, such as your diet and exercise for the day.  Most glucose meters store a record of glucose readings in the meter. Some meters allow you to download your records to a computer. How do I check my blood glucose? Follow these steps to get accurate readings of your blood glucose: Supplies needed   Blood glucose meter.  Test strips for your meter. Each meter has its own strips. You must use the strips that come with your meter.  A needle to prick your finger (lancet). Do not use lancets more than once.  A device that holds the lancet (lancing device).  A journal or log book to write down your results.  Procedure  Wash your hands with soap and water.  Prick the side of your finger (not the tip) with the lancet. Use a different finger each time.  Gently rub the finger until a small drop of blood appears.  Follow instructions that come with your meter for inserting the test strip, applying blood to the strip, and using your blood glucose meter.  Write down your result and any notes.  Alternative testing sites  Some meters allow you to use areas of your body other than your finger (alternative sites)  to test your blood.  If you think you may have hypoglycemia, or if you have hypoglycemia unawareness, do not use alternative sites. Use your finger instead.  Alternative sites may not be as accurate as the fingers, because blood flow is slower in these areas. This means that the result you get may be delayed, and it may be different from the result that you would get from your finger.  The most common alternative sites are: ? Forearm. ? Thigh. ? Palm of the hand.  Additional tips  Always keep your supplies with you.  If you have questions or need help, all blood glucose meters have a 24-hour "hotline" number that you can call. You may also contact your health care provider.  After you use a few boxes of test strips, adjust (calibrate) your blood glucose meter by following instructions that came with your meter.    The American Diabetes Association suggests the following targets for most nonpregnant adults with diabetes.  More or less stringent glycemic goals may be appropriate for each individual.  A1C: Less than 7% A1C may also be reported as eAG: Less than 154 mg/dl Before a meal (preprandial plasma glucose): 80-130 mg/dl 1-2 hours after beginning of the meal (Postprandial  plasma glucose)*: Less than 180 mg/dl  *Postprandial glucose may be targeted if A1C goals are not met despite reaching preprandial glucose goals.   GOALS in short:  The goals are for the Hgb A1C to be less than 7.0 & blood pressure to be less than 130/80.    It is recommended that all diabetics are educated on and follow a healthy diabetic diet, exercise for 30 minutes 3-4 times per week (walking, biking, swimming, or machine), monitor blood glucose readings and bring that record with you to be reviewed at your next office visit.     You should be checking fasting blood sugars- especially after you eat poorly or eat really healthy, and also check 2 hour postprandial blood sugars after largest meal of the day.     Write these down and bring in your log at each office visit.    You will need to be seen every 3 months by the provider managing your Diabetes unless told otherwise by that provider.   You will need yearly eye exams from an eye specialist and foot exams to check the nerves of your feet.  Also, your urine should be checked yearly as well to make sure excess protein is not present.   If you are checking your blood pressure at home, please record it and bring it to your next office visit.    Follow the Dietary Approaches to Stop Hypertension (DASH) diet (3 servings of fruit and vegetables daily, whole grains, low sodium, low-fat proteins).  See below.    Lastly, when it comes to your cholesterol, the goal is to have the HDL (good cholesterol) >40, and the LDL (bad cholesterol) <100.   It is recommended that you follow a heart healthy, low saturated and trans-fat diet and exercise for 30 minutes at least 5 times a week.     (( Check out the DASH diet = 1.5 Gram Low Sodium Diet   A 1.5 gram sodium diet restricts the amount of sodium in the diet to no more than 1.5 g or 1500 mg daily.  The American Heart Association recommends Americans over the age of 48 to consume no more than 1500 mg of sodium each day to reduce the risk of developing high blood pressure.  Research also shows that limiting sodium may reduce heart attack and stroke risk.  Many foods contain sodium for flavor and sometimes as a preservative.  When the amount of sodium in a diet needs to be low, it is important to know what to look for when choosing foods and drinks.  The following includes some information and guidelines to help make it easier for you to adapt to a low sodium diet.    QUICK TIPS  Do not add salt to food.  Avoid convenience items and fast food.  Choose unsalted snack foods.  Buy lower sodium products, often labeled as "lower sodium" or "no salt added."  Check food labels to learn how much sodium is in 1 serving.   When eating at a restaurant, ask that your food be prepared with less salt or none, if possible.    READING FOOD LABELS FOR SODIUM INFORMATION  The nutrition facts label is a good place to find how much sodium is in foods. Look for products with no more than 400 mg of sodium per serving.  Remember that 1.5 g = 1500 mg.  The food label may also list foods as:  Sodium-free: Less than 5 mg in a serving.  Very low sodium: 35 mg or less in a serving.  Low-sodium: 140 mg or less in a serving.  Light in sodium: 50% less sodium in a serving. For example, if a food that usually has 300 mg of sodium is changed to become light in sodium, it will have 150 mg of sodium.  Reduced sodium: 25% less sodium in a serving. For example, if a food that usually has 400 mg of sodium is changed to reduced sodium, it will have 300 mg of sodium.    CHOOSING FOODS  Grains  Avoid: Salted crackers and snack items. Some cereals, including instant hot cereals. Bread stuffing and biscuit mixes. Seasoned rice or pasta mixes.  Choose: Unsalted snack items. Low-sodium cereals, oats, puffed wheat and rice, shredded wheat. English muffins and bread. Pasta.  Meats  Avoid: Salted, canned, smoked, spiced, pickled meats, including fish and poultry. Bacon, ham, sausage, cold cuts, hot dogs, anchovies.  Choose: Low-sodium canned tuna and salmon. Fresh or frozen meat, poultry, and fish.  Dairy  Avoid: Processed cheese and spreads. Cottage cheese. Buttermilk and condensed milk. Regular cheese.  Choose: Milk. Low-sodium cottage cheese. Yogurt. Sour cream. Low-sodium cheese.  Fruits and Vegetables  Avoid: Regular canned vegetables. Regular canned tomato sauce and paste. Frozen vegetables in sauces. Olives. Angie Fava. Relishes. Sauerkraut.  Choose: Low-sodium canned vegetables. Low-sodium tomato sauce and paste. Frozen or fresh vegetables. Fresh and frozen fruit.  Condiments  Avoid: Canned and packaged gravies. Worcestershire sauce.  Tartar sauce. Barbecue sauce. Soy sauce. Steak sauce. Ketchup. Onion, garlic, and table salt. Meat flavorings and tenderizers.  Choose: Fresh and dried herbs and spices. Low-sodium varieties of mustard and ketchup. Lemon juice. Tabasco sauce. Horseradish.    SAMPLE 1.5 GRAM SODIUM MEAL PLAN:   Breakfast / Sodium (mg)  1 cup low-fat milk / 143 mg  1 whole-wheat English muffin / 240 mg  1 tbs heart-healthy margarine / 153 mg  1 hard-boiled egg / 139 mg  1 small orange / 0 mg  Lunch / Sodium (mg)  1 cup raw carrots / 76 mg  2 tbs no salt added peanut butter / 5 mg  2 slices whole-wheat bread / 270 mg  1 tbs jelly / 6 mg   cup red grapes / 2 mg  Dinner / Sodium (mg)  1 cup whole-wheat pasta / 2 mg  1 cup low-sodium tomato sauce / 73 mg  3 oz lean ground beef / 57 mg  1 small side salad (1 cup raw spinach leaves,  cup cucumber,  cup yellow bell pepper) with 1 tsp olive oil and 1 tsp red wine vinegar / 25 mg  Snack / Sodium (mg)  1 container low-fat vanilla yogurt / 107 mg  3 graham cracker squares / 127 mg  Nutrient Analysis  Calories: 1745  Protein: 75 g  Carbohydrate: 237 g  Fat: 57 g  Sodium: 1425 mg  Document Released: 07/02/2005 Document Revised: 03/14/2011 Document Reviewed: 10/03/2009  ExitCare Patient Information 2012 [place].))    This information is not intended to replace advice given to you by your health care provider. Make sure you discuss any questions you have with your health care provider. Document Released: 07/05/2003 Document Revised: 01/20/2016 Document Reviewed: 12/12/2015 Elsevier Interactive Patient Education  2017 [employer].

## 2017-09-30 NOTE — Progress Notes (Signed)
Impression and Recommendations:    1. Type 2 diabetes mellitus with complication, without long-term current use of insulin ([place])   2. Hypertension associated with diabetes ([place])   3. Mixed diabetic hyperlipidemia associated with type 2 diabetes mellitus ([place])   4. Morbid obesity BMI >er 48   5. Prediabetes: new onset 04/2016    Started 2 new medications and increased metformin.  1. Diabetes - A1c today is 7.0. - Reviewed that goal A1c is in the mid 6.0's.  - Patient has been taking metformin since 03/28/2017, (1) 500 mg tablet twice daily. - Increased to (2) in the morning and (2) at night, for 1000 mg twice daily.  - Reviewed that patient needs yearly foot exam, needs yearly diabetic eye exam. - Patient does not currently have a podiatrist.  - Will need to check yearly urine for protein in his urine.  - Noted that some diabetes medicines cause weight loss, like Saxenda, Victoza, etc. - We will consider implementing or adding these in the future as the patient desires.  - Patient should begin checking his blood sugars at home. - Emphasized the importance of closely monitoring his blood sugar at home. - Check fasting sugars in the morning after waking up.  Also, if you want to check another time of day, check post-prandial 2 hours after your largest meal.  Do not check blood sugar randomly throughout the day, unless you feel poorly or badly.  Check if feeling symptomatic.  - Advised the patient that he can consult with a diabetic educator. - Knows to give [place] a call if he changes his mind.  - American Diabetes Association (http://www.diabetes.org) recommended to the patient for information.  2. Cholesterol - Atorvastatin (Lipitor) prescribed to be taken at night before bedtime.  - Reviewed that diabetics are at super high risk for cardiovascular events when sugar is flowing through the blood. - Discussed the need for cholesterol medications moving forward - and this  will help protect his kidneys.  - Reviewed that his chance of an atherosclerotic event is currently as follows: The 10-year ASCVD risk score Mikey Bussing DC Brooke Bonito., et al., 2013) is: 20.9%   Values used to calculate the score:     Age: 62 years     Sex: Male     Is Non-Hispanic African American: No     Diabetic: Yes     Tobacco smoker: No     Systolic Blood Pressure: 381 mmHg     Is BP treated: No     HDL Cholesterol: 32 mg/dL     Total Cholesterol: 140 mg/dL  3. HTN - Beginning patient on olmesartan generic. - Low dose 20-5-12.  Will start with a half tablet. - Noted that this medication works synergistically to lower his blood pressure.  - Reviewed goal blood pressure as 130/80 or less.  - Patient should begin monitoring his blood pressure at home. - Emphasized the importance of closely monitoring his blood pressure at home. - Reviewed proper technique for taking blood pressure readings - sit quietly for 15 minutes with both feet on the ground, don't eat anything spicy, stimulating, or caffienated beforehand.  4. BMI Greater than 48; Weight Loss & Diet Explained to patient what BMI refers to, and what it means medically.    Told patient to think about it as a "medical risk stratification measurement" and how increasing BMI is associated with increasing risk/ or worsening state of various diseases such as hypertension, hyperlipidemia, diabetes,  premature OA, depression etc.  American Heart Association guidelines for healthy diet, basically Mediterranean diet, and exercise guidelines of 30 minutes 5 days per week or more discussed in detail.  Health counseling performed.  All questions answered.  - Emphasized that it's much easier to gain weight than to lose it or even maintain it.  Current Impression - Up 12 pounds from three months ago. - Reviewed that as his weight goes up, his A1c, blood pressure, other values will also go up, joints and ankles and knees are going to hurt more, etc.;  increasing weight leads to increasing body problems.  Exercise & Diet Recommedations - Advised patient to continue working toward exercising to improve health.   - Encouraged that now that his foot feels better, he can start to be more active.  - Advised patient to begin with 15 minutes of activity daily.  Recommended that the patient eventually strive for at least 150 minutes of cardio per week according to guidelines established by the [facility].   - Healthy dietary habits encouraged, including low-carb, and high amounts of lean protein in diet.   - Patient should also consume adequate amounts of water - half of body weight in oz of water per day  - Advised beginning tracking his intake on an app like LoseIt, MyFitnessPal, or joining a program like Weight Watchers for accountability.  - Information provided on keto diet.  5. Follow-Up - Return in 4 weeks for follow up on new diabetes diagnosis. - Changed prescriptions - added low dose olmesartan, increased dose of metformin to (2) tablets in morning and (2) tablets in the evening, and added atorvastatin (lipitor).  - Patient knows to systematically introduce his new medications: - Advised to double metformin first. - Then, after a couple of days tolerating this, start with new blood pressure medication every morning. - For the next week, then start with cholesterol medicine every evening before bedtime.  - At follow-up, need to check his blood pressure and blood sugar. - Patient knows to bring in blood pressure log and blood sugar log.   Education and routine counseling performed. Handouts provided.  Pt was in the office today for 36+ minutes, with over 50% time spent in face to face counseling of patients various medical conditions, treatment plans of those medical conditions including medicine management and lifestyle modification, strategies to improve health and well being; and in coordination of care. SEE ABOVE FOR DETAILS  PLUS  ADDITIONAL 15 MINUTE MEDICATION MANAGEMENT DISCUSSION    Orders Placed This Encounter  Procedures  . Microalbumin / creatinine urine ratio  . POCT glycosylated hemoglobin (Hb A1C)    Meds ordered this encounter  Medications  . metFORMIN (GLUCOPHAGE) 500 MG tablet    Sig: Take 2 tablets (1,000 mg total) by mouth 2 (two) times daily with a meal.    Dispense:  180 tablet    Refill:  3  . Olmesartan-amLODIPine-HCTZ 20-5-12.5 MG TABS    Sig: Take 0.5 tablets by mouth daily. p 1 wk inc to 1 po qd if BP not at goal of <130/80    Dispense:  90 tablet    Refill:  0  . atorvastatin (LIPITOR) 20 MG tablet    Sig: Take 1 tablet (20 mg total) by mouth at bedtime.    Dispense:  90 tablet    Refill:  0    Return in about 4 weeks (around 10/28/2017) for Bp log, BS log, started 2 new meds &increased metformin.   The  patient was counseled, risk factors were discussed, anticipatory guidance given.  Gross side effects, risk and benefits, and alternatives of medications discussed with patient.  Patient is aware that all medications have potential side effects and we are unable to predict every side effect or drug-drug interaction that may occur.  Expresses verbal understanding and consents to current therapy plan and treatment regimen.  Please see AVS handed out to patient at the end of our visit for further patient instructions/ counseling done pertaining to today's office visit.    Note: This document was prepared using Dragon voice recognition software and may include unintentional dictation errors.  This document serves as a record of services personally performed by [name], DO. It was created on her behalf by Toni Amend, a trained medical scribe. The creation of this record is based on the scribe's personal observations and the provider's statements to them.   I have reviewed the above medical documentation for accuracy and completeness and I concur.  [name] 10/07/17 5:15 PM    Subjective:    Chief Complaint  Patient presents with  . Follow-up    Robert [name] is a 62 y.o. male who presents to [place] at [facility] today for Diabetes Management.    Notes that he's sure his A1c will not be good today. He felt that he'd been doing better on his weight, and then was shocked today that he gained. Was down to 340 lbs in September of 2018.  Weighs 354 lbs today.  Additionally, his brother-in-law came to visit and took them out to dinner, breakfast, lunch, etc.  Notes that his heel is doing a lot better.  He is not using his special shoe anymore. Does still have inserts in his walking shoes to help with impact.  Is not using nitro patches at all anymore.  DM HPI: -  He has not been working on diet and exercise to prevent diabetes.  Diagnosed with diabetes today.  Pt is currently maintained on the following medications for diabetes:   see med list today He has been taking metformin since 03/16/2017 - 1 500 mg tablet by mouth twice daily. Medication compliance - has been taking it as prescribed.  Patient has not been taking glucose readings at home. He did not obtain his glucometer.   Denies polyuria/polydipsia. Denies hypo/ hyperglycemia symptoms - He denies new onset of: chest pain, exercise intolerance, shortness of breath, dizziness, visual changes, headache, lower extremity swelling or claudication.   Last diabetic eye exam was No results found for: HMDIABEYEEXA  Foot exam - Needed.  Last A1C in the office was:  Lab Results  Component Value Date   HGBA1C 7.0 09/30/2017   HGBA1C 6.5 07/02/2017   HGBA1C 6.2 03/28/2017    Lab Results  Component Value Date   LDLCALC 81 05/09/2017   CREATININE 0.76 05/09/2017    Last 3 blood pressure readings in our office are as follows: BP Readings from Last 3 Encounters:  09/30/17 131/73  08/07/17 (!) 163/76  07/03/17 140/83    BMI Readings from Last 3  Encounters:  09/30/17 48.01 kg/m  08/07/17 46.52 kg/m  07/03/17 45.75 kg/m     No problems updated.    Patient Care Team    Relationship Specialty Notifications Start End  [name], DO PCP - General Family Medicine  02/20/16   Daubert, Marlise Eves, MD  Orthopedic Surgery  04/11/16   Dian Situ, MD  Pain Medicine  04/11/16  Wert, Michael B, MD Consulting Physician Pulmonary Disease  04/11/16    Comment: Obstructive sleep apnea, restrictive lung disease due to severe morbid obesity  Campbell, Pamela Diann, PA-C  Pain Medicine  07/23/17      Patient Active Problem List   Diagnosis Date Noted  . Vitamin D deficiency 05/08/2016    Priority: High  . Prediabetes: new onset 04/2016 05/08/2016    Priority: High  . Bilateral lower extremity pitting edema 04/11/2016    Priority: High  . Restrictive lung disease secondary to obesity 04/11/2016    Priority: High  . Morbid obesity BMI >er 48 02/15/2012    Priority: High  . Chronic venous stasis dermatitis of both lower extremities 05/08/2016    Priority: Medium  . OSA on CPAP- seen Dr Wert in past 04/11/2016    Priority: Medium  . Physical deconditioning 04/11/2016    Priority: Medium  . h/o Depressive disorder 04/11/2016    Priority: Medium  . History of tobacco use 04/11/2016    Priority: Low  . Postlaminectomy syndrome, lumbar region 01/04/2012    Priority: Low  . h/o Narcotic abuse 01/04/2012    Priority: Low  . Constipation 09/22/2011    Priority: Low  . Chronic back pain 09/22/2011    Priority: Low  . Type 2 diabetes mellitus with complication, without long-term current use of insulin ([facility]) 09/30/2017  . Hypertension associated with diabetes ([facility]) 09/30/2017  . Mixed diabetic hyperlipidemia associated with type 2 diabetes mellitus ([facility]) 09/30/2017  . Arthritis of carpometacarpal (CMC) joint of left thumb 07/02/2017  . Bilateral leg cramps 05/09/2017  . acute Left heel pain 03/28/2017  . Tick bite 02/11/2017  .  Barrett's esophagus 12/07/2016  . Chronic pain of left heel 11/27/2016  . Lumbar radiculopathy 07/21/2012  . Chronic pain due to injury 07/21/2012  . Back pain 01/04/2012  . Dyspnea 11/27/2011  . Confusion 09/22/2011  . Abdominal pain 09/22/2011  . Dehydration 09/22/2011  . Nausea & vomiting 09/22/2011     Past Medical History:  Diagnosis Date  . Allergic rhinitis due to pollen   . Arthritis   . Asthma   . Back pain   . Barrett esophagus   . Depressive disorder   . Exposure to hepatitis B   . Exposure to hepatitis C   . Narcotic abuse ([facility])    pain medications  . OSA (obstructive sleep apnea)    on CPAP      Past Surgical History:  Procedure Laterality Date  . ABDOMINAL SURGERY    . BACK SURGERY     Rods and screws in lumbar area  . COLONOSCOPY    . ESOPHAGOGASTRODUODENOSCOPY  multiple  . HERNIA REPAIR     4  . JOINT REPLACEMENT    . TOTAL KNEE ARTHROPLASTY  2009   left     Family History  Problem Relation Age of Onset  . Allergies Mother   . Early death Father   . Thyroid disease Sister   . Colon cancer Neg Hx   . Stomach cancer Neg Hx      Social History   Substance and Sexual Activity  Drug Use No   Comment: Prior abuse of narcotics  ,  Social History   Substance and Sexual Activity  Alcohol Use No  ,  Social History   Tobacco Use  Smoking Status Former Smoker  . Packs/day: 0.80  . Years: 15.00  . Pack years: 12.00  . Types: Cigarettes  . Last attempt   to quit: 07/16/2004  . Years since quitting: 13.2  Smokeless Tobacco Never Used  ,    Current Outpatient Medications on File Prior to Visit  Medication Sig Dispense Refill  . albuterol (PROVENTIL HFA;VENTOLIN HFA) 108 (90 Base) MCG/ACT inhaler Inhale 2 puffs into the lungs every 6 (six) hours as needed for wheezing or shortness of breath.    . APPLE CIDER VINEGAR PO Take by mouth.    . B Complex Vitamins (B COMPLEX 100 PO) Take 1 capsule by mouth daily.    . blood glucose meter kit  and supplies KIT Dispense based on patient and insurance preference. Use up to four times daily as directed. (FOR ICD-9 250.00, 250.01). 1 each 0  . Blood Glucose Monitoring Suppl (ONE TOUCH ULTRA MINI) w/Device KIT Use to test blood sugar, test in the morning fasting and test after largest meal of the day. 1 each 0  . Cholecalciferol (VITAMIN D3) 5000 units TABS 5,000 IU OTC vitamin D3 daily. 90 tablet 3  . cyclobenzaprine (FLEXERIL) 10 MG tablet TAKE 1 TABLET BY MOUTH TWICE A DAY 60 tablet 1  . glucose blood (ONE TOUCH ULTRA TEST) test strip Use to test blood sugar, test in the morning fasting and test after largest meal of the day. 100 each 11  . ibuprofen (ADVIL,MOTRIN) 200 MG tablet Take 200 mg by mouth every 6 (six) hours as needed for fever or mild pain.    . meloxicam (MOBIC) 15 MG tablet TAKE 1 TABLET BY MOUTH EVERY DAY 30 tablet 1  . Misc Natural Products (GINSENG COMPLEX PO) Take by mouth.    Glory Rosebush DELICA LANCETS 54O MISC Use for testing blood sugar, test once in the morning fasting and test after largest meal of the day. 100 each 11  . Potassium 75 MG TABS Take 1 tablet by mouth daily at 8 pm.    . traMADol (ULTRAM) 50 MG tablet Take 50 mg by mouth 3 (three) times daily. For pain    . vitamin C (ASCORBIC ACID) 500 MG tablet Take 500 mg by mouth daily.    . Vitamin D, Ergocalciferol, (DRISDOL) 50000 units CAPS capsule TAKE 1 CAPSULE WEEKLY 12 capsule 4  . zinc gluconate 50 MG tablet Take 50 mg by mouth daily.     No current facility-administered medications on file prior to visit.      No Known Allergies   Review of Systems:   General:  Denies fever, chills Optho/Auditory:   Denies visual changes, blurred vision Respiratory:   Denies SOB, cough, wheeze, DIB  Cardiovascular:   Denies chest pain, palpitations, painful respirations Gastrointestinal:   Denies nausea, vomiting, diarrhea.  Endocrine:     Denies new hot or cold intolerance Musculoskeletal:  Denies joint  swelling, gait issues, or new unexplained myalgias/ arthralgias Skin:  Denies rash, suspicious lesions  Neurological:    Denies dizziness, unexplained weakness, numbness  Psychiatric/Behavioral:   Denies mood changes    Objective:     Blood pressure 131/73, pulse 73, height 6' (1.829 m), weight (!) 354 lb (160.6 kg), SpO2 97 %.  Body mass index is 48.01 kg/m.  General: Well Developed, well nourished, and in no acute distress.  HEENT: Normocephalic, atraumatic, pupils equal round reactive to light, neck supple, No carotid bruits, no JVD Skin: Warm and dry, cap RF less 2 sec Cardiac: Regular rate and rhythm, S1, S2 WNL's, no murmurs rubs or gallops Respiratory: ECTA B/L, Not using accessory muscles, speaking in full sentences. NeuroM-Sk: Ambulates  w/o assistance, moves ext * 4 w/o difficulty, sensation grossly intact.  Ext: scant edema b/l lower ext Psych: No HI/SI, judgement and insight good, Euthymic mood. Full Affect.

## 2017-10-07 ENCOUNTER — Telehealth: Payer: Self-pay | Admitting: Family Medicine

## 2017-10-07 DIAGNOSIS — E118 Type 2 diabetes mellitus with unspecified complications: Secondary | ICD-10-CM

## 2017-10-07 NOTE — Telephone Encounter (Signed)
Absolutely yes. Thank you!

## 2017-10-07 NOTE — Addendum Note (Signed)
Addended by: Chalmers CaterUTTLE, Kuron Docken H on: 10/07/2017 01:58 PM   Modules accepted: Orders

## 2017-10-07 NOTE — Telephone Encounter (Signed)
At last OV, it was discussed sending patient to nutrition and diabetes management, patient would like to go ahead with this. Can I please get a referral for this service

## 2017-10-07 NOTE — Telephone Encounter (Signed)
Is it ok to place referral? 

## 2017-10-11 DIAGNOSIS — M9901 Segmental and somatic dysfunction of cervical region: Secondary | ICD-10-CM | POA: Diagnosis not present

## 2017-10-11 DIAGNOSIS — M461 Sacroiliitis, not elsewhere classified: Secondary | ICD-10-CM | POA: Diagnosis not present

## 2017-10-11 DIAGNOSIS — M9905 Segmental and somatic dysfunction of pelvic region: Secondary | ICD-10-CM | POA: Diagnosis not present

## 2017-10-11 DIAGNOSIS — M5116 Intervertebral disc disorders with radiculopathy, lumbar region: Secondary | ICD-10-CM | POA: Diagnosis not present

## 2017-10-11 DIAGNOSIS — M9904 Segmental and somatic dysfunction of sacral region: Secondary | ICD-10-CM | POA: Diagnosis not present

## 2017-10-11 DIAGNOSIS — M542 Cervicalgia: Secondary | ICD-10-CM | POA: Diagnosis not present

## 2017-10-16 DIAGNOSIS — M9905 Segmental and somatic dysfunction of pelvic region: Secondary | ICD-10-CM | POA: Diagnosis not present

## 2017-10-16 DIAGNOSIS — M461 Sacroiliitis, not elsewhere classified: Secondary | ICD-10-CM | POA: Diagnosis not present

## 2017-10-16 DIAGNOSIS — Z79899 Other long term (current) drug therapy: Secondary | ICD-10-CM | POA: Diagnosis not present

## 2017-10-16 DIAGNOSIS — M9904 Segmental and somatic dysfunction of sacral region: Secondary | ICD-10-CM | POA: Diagnosis not present

## 2017-10-16 DIAGNOSIS — M5116 Intervertebral disc disorders with radiculopathy, lumbar region: Secondary | ICD-10-CM | POA: Diagnosis not present

## 2017-10-16 DIAGNOSIS — M545 Low back pain: Secondary | ICD-10-CM | POA: Diagnosis not present

## 2017-10-16 DIAGNOSIS — Z79891 Long term (current) use of opiate analgesic: Secondary | ICD-10-CM | POA: Diagnosis not present

## 2017-10-16 DIAGNOSIS — M542 Cervicalgia: Secondary | ICD-10-CM | POA: Diagnosis not present

## 2017-10-16 DIAGNOSIS — M9901 Segmental and somatic dysfunction of cervical region: Secondary | ICD-10-CM | POA: Diagnosis not present

## 2017-10-16 DIAGNOSIS — G894 Chronic pain syndrome: Secondary | ICD-10-CM | POA: Diagnosis not present

## 2017-10-16 DIAGNOSIS — M47816 Spondylosis without myelopathy or radiculopathy, lumbar region: Secondary | ICD-10-CM | POA: Diagnosis not present

## 2017-10-23 DIAGNOSIS — M542 Cervicalgia: Secondary | ICD-10-CM | POA: Diagnosis not present

## 2017-10-23 DIAGNOSIS — M9905 Segmental and somatic dysfunction of pelvic region: Secondary | ICD-10-CM | POA: Diagnosis not present

## 2017-10-23 DIAGNOSIS — M461 Sacroiliitis, not elsewhere classified: Secondary | ICD-10-CM | POA: Diagnosis not present

## 2017-10-23 DIAGNOSIS — M5116 Intervertebral disc disorders with radiculopathy, lumbar region: Secondary | ICD-10-CM | POA: Diagnosis not present

## 2017-10-23 DIAGNOSIS — M9904 Segmental and somatic dysfunction of sacral region: Secondary | ICD-10-CM | POA: Diagnosis not present

## 2017-10-23 DIAGNOSIS — M9901 Segmental and somatic dysfunction of cervical region: Secondary | ICD-10-CM | POA: Diagnosis not present

## 2017-10-30 DIAGNOSIS — M5116 Intervertebral disc disorders with radiculopathy, lumbar region: Secondary | ICD-10-CM | POA: Diagnosis not present

## 2017-10-30 DIAGNOSIS — M9904 Segmental and somatic dysfunction of sacral region: Secondary | ICD-10-CM | POA: Diagnosis not present

## 2017-10-30 DIAGNOSIS — M9901 Segmental and somatic dysfunction of cervical region: Secondary | ICD-10-CM | POA: Diagnosis not present

## 2017-10-30 DIAGNOSIS — M9905 Segmental and somatic dysfunction of pelvic region: Secondary | ICD-10-CM | POA: Diagnosis not present

## 2017-10-30 DIAGNOSIS — M461 Sacroiliitis, not elsewhere classified: Secondary | ICD-10-CM | POA: Diagnosis not present

## 2017-10-30 DIAGNOSIS — M542 Cervicalgia: Secondary | ICD-10-CM | POA: Diagnosis not present

## 2017-10-31 ENCOUNTER — Ambulatory Visit (INDEPENDENT_AMBULATORY_CARE_PROVIDER_SITE_OTHER): Payer: Medicare Other | Admitting: Family Medicine

## 2017-10-31 ENCOUNTER — Encounter: Payer: Self-pay | Admitting: Family Medicine

## 2017-10-31 VITALS — BP 133/79 | HR 73 | Ht 73.0 in | Wt 343.0 lb

## 2017-10-31 DIAGNOSIS — E118 Type 2 diabetes mellitus with unspecified complications: Secondary | ICD-10-CM

## 2017-10-31 DIAGNOSIS — E1169 Type 2 diabetes mellitus with other specified complication: Secondary | ICD-10-CM

## 2017-10-31 DIAGNOSIS — I10 Essential (primary) hypertension: Secondary | ICD-10-CM

## 2017-10-31 DIAGNOSIS — E782 Mixed hyperlipidemia: Secondary | ICD-10-CM | POA: Diagnosis not present

## 2017-10-31 DIAGNOSIS — R252 Cramp and spasm: Secondary | ICD-10-CM

## 2017-10-31 DIAGNOSIS — E1159 Type 2 diabetes mellitus with other circulatory complications: Secondary | ICD-10-CM

## 2017-10-31 NOTE — Patient Instructions (Signed)
In 2 weeks which would be 6 weeks from when he started the on losartan, increase metformin and added Lipitor, he will come in for a CMP lab only visit.  Continue weight loss which she is lost 10 pounds in the past 1 month.  This is great.  We will see him in 3 to 4 months since his last A1c which was 7 around mid March.

## 2017-10-31 NOTE — Progress Notes (Signed)
Impression and Recommendations:    1. Type 2 diabetes mellitus with complication, without long-term current use of insulin ([place])   2. Hypertension associated with diabetes ([place])   3. Mixed diabetic hyperlipidemia associated with type 2 diabetes mellitus ([place])   4. Morbid obesity BMI >er 48   5. Bilateral leg cramps     - Since last visit, patient was able to purchase his new medications, but unable to afford the necessary tools to monitor his blood sugar and blood pressure (noted glucometer and test strips in particular).  - Advised patient to call medicare and ask about cost for glucometer, strips, and lancets.  - In the meantime, advised patient that he may stop by the clinic to check his blood pressure or blood sugar any time.  1. Diabetes Mellitus - A1c last check 09/30/2017 was 7.0. - Reviewed that goal A1c is in the mid 6.0's.  - Increased metformin last visit 09/30/2017 to (2) in the morning and (2) at night, for 1000 mg twice daily. - Patient tolerates increased dosage well.  Continue as prescribed.  - Reviewed that patient needs yearly foot exam and yearly diabetic eye exam. - Patient does not currently have a podiatrist.  - Reviewed need to check yearly urine for protein in his urine.  - Noted that some diabetes medicines cause weight loss, like Saxenda, Victoza, etc. - We will consider implementing or adding these in the future as the patient desires.  - Patient urgently needs to begin checking his blood sugars at home.  Emphasized the importance of this. - Check sugars after you eat especially healthfully, or especially poorly.  Patient should check post-prandial 2 hours after your largest meal, and fasting sugars in the morning after waking up.  Do not check blood sugar randomly throughout the day, unless you feel exceptionally symptomatic.  - Advised the patient that he can consult with a diabetic educator.  Knows to give [place] a call if he desires  referral.  - American Diabetes Association (http://www.diabetes.org) recommended to the patient for information.  2. Cholesterol - Continue Atorvastatin (Lipitor) as prescribed, at night before bedtime.  Patient tolerates med well.  - Reviewed that diabetics are at high risk for cardiovascular events. - Discussed the need for cholesterol medications moving forward - (and this will help protect his kidneys).  The 10-year ASCVD risk score Mikey Bussing DC Brooke Bonito., et al., 2013) is: 21.4%   Values used to calculate the score:     Age: 62 years     Sex: Male     Is Non-Hispanic African American: No     Diabetic: Yes     Tobacco smoker: No     Systolic Blood Pressure: 412 mmHg     Is BP treated: No     HDL Cholesterol: 32 mg/dL     Total Cholesterol: 140 mg/dL  3. HTN - Last visit (09/30/2017), began patient on olmesartan generic. - Patient tolerates medicine well.  Continue as prescribed.  - Reviewed goal blood pressure as 130/80 or less.  - Patient should begin monitoring his blood pressure at home. - Emphasized the importance of closely monitoring his blood pressure at home. - Reviewed proper technique for taking blood pressure readings - sit quietly for 15 minutes with both feet on the ground, don't eat anything spicy, stimulating, or caffienated beforehand.  4. BMI Greater than 48; Weight Loss & Diet Explained to patient what BMI refers to, and what it means medically.  Told patient to think about it as a "medical risk stratification measurement" and how increasing BMI is associated with increasing risk/ or worsening state of various diseases such as hypertension, hyperlipidemia, diabetes, premature OA, depression etc.  American Heart Association guidelines for healthy diet, basically Mediterranean diet, and exercise guidelines of 30 minutes 5 days per week or more discussed in detail.  Health counseling performed.  All questions answered.  - Emphasized that it's much easier to  gain weight than to lose it or even maintain it.  Current Impression - Down 11 pounds from visit last month (from 354 to 343. - Encouraged continued weight loss at around 1-2 lbs per week.  - Reviewed that at high weight, his A1c, blood pressure, other values also go up, joints and ankles and knees hurt more, etc.; increasing weight leads to increasing body problems.  Exercise & Diet Recommedations - Advised patient to continue working toward exercising to improve health.   - Encouraged that he obtain as much physical activity as possible.  - Advised patient to begin with 15 minutes of activity daily.  Recommended that the patient eventually strive for at least 150 minutes of cardio per week according to guidelines established by the [facility].   - Healthy dietary habits encouraged.  Continue with low-carb, and high amounts of lean protein in diet.   - Patient should also consume adequate amounts of water - half of body weight in oz of water per day.  - Advised beginning tracking his intake on an app like LoseIt, MyFitnessPal, or joining a program like Weight Watchers for accountability.  5. Follow-Up - Return in near future for lab only CMP, about 6 weeks since starting Lipitor and increased BP meds. - Continue meds as prescribed, olmesartan, metformin (2) tablets in morning and (2) tablets in the evening, and atorvastatin (lipitor).  - Return mid to late June or mid to late July. - At follow-up, will check blood pressure and blood sugar. - Patient knows to obtain the necessary tools ASAP and bring in blood pressure log and blood sugar log.  - Patient knows to call if he needs to come in sooner.   Education and routine counseling performed. Handouts provided.  Orders Placed This Encounter  Procedures  . Comprehensive metabolic panel    No orders of the defined types were placed in this encounter.   Return for DM, HTN, HLD follow up every 17moor so.   The patient was  counseled, risk factors were discussed, anticipatory guidance given.  Gross side effects, risk and benefits, and alternatives of medications discussed with patient.  Patient is aware that all medications have potential side effects and we are unable to predict every side effect or drug-drug interaction that may occur.  Expresses verbal understanding and consents to current therapy plan and treatment regimen.  Please see AVS handed out to patient at the end of our visit for further patient instructions/ counseling done pertaining to today's office visit.    Note: This document was prepared using Dragon voice recognition software and may include unintentional dictation errors.  This document serves as a record of services personally performed by DMellody Dance DO. It was created on her behalf by KToni Amend a trained medical scribe. The creation of this record is based on the scribe's personal observations and the provider's statements to them.   I have reviewed the above medical documentation for accuracy and completeness and I concur.  DMellody Dance04/21/19 8:24 PM    Subjective:  Chief Complaint  Patient presents with  . Follow-up   Robert [name] is a 62 y.o. male who presents to [place] at [facility] today for Diabetes Management.    Last visit, started two new meds (olmesartan and atorvastatin), and increased dose of metformin.  Patient was able to purchase his new medications, but did not bring a blood pressure log or blood sugar log with him today because he was not able to afford the necessary tools (glucometer and test strips in particular).  He and his family have been having a hard time making ends meet financially.  Notes living paycheck to paycheck and hardly being able to afford necessities.  Cholesterol - Patient reports good compliance with medications or treatment plan.  Notes "everything seems to be fine" on the Lipitor.  Takes it every  night before bed.  - Has been drinking plenty of water; notes that he doesn't pay attention to how many ounces he drinks, he just keeps drinking.  "I keep an eye on the color of the urine, too.  I want it as clear as possible."  Notes that he takes ginseng as well to "clean out his system."  - Denies medication S-E.   - Smoking Status noted   - He denies new onset of: chest pain, exercise intolerance, shortness of breath, dizziness, visual changes, headache, lower extremity swelling or claudication.   Denies new myalgias.  The cholesterol last visit was:  Lab Results  Component Value Date   CHOL 140 05/09/2017   HDL 32 (L) 05/09/2017   LDLCALC 81 05/09/2017   TRIG 133 05/09/2017   CHOLHDL 4.4 05/09/2017    Hepatic Function Latest Ref Rng & Units 05/09/2017 04/25/2016 05/31/2012  Total Protein 6.0 - 8.5 g/dL 6.2 6.4 6.9  Albumin 3.6 - 4.8 g/dL 4.3 4.2 4.0  AST 0 - 40 IU/L 27 21 24   ALT 0 - 44 IU/L 35 38 38  Alk Phosphatase 39 - 117 IU/L 70 69 72  Total Bilirubin 0.0 - 1.2 mg/dL 0.5 0.5 0.7    Diabetes Mellitus HPI: -  Patient has been working on diet and exercise for diabetes.  He has lost ten pounds since last appointment.  Has been avoiding pizza and ice cream.  Has eliminated cookies and breads.  Notes that he likes eating oatmeal for breakfast, bananas, peanut butter with celery, and apples.  Has been walking a little bit over the past month, taking his dog for walks outside.  Pt is currently maintained on the following medications for diabetes:   see med list today Medication compliance - taking increased dose as prescribed.  Home glucose readings range - has not been measuring his blood sugar at home.   Denies polyuria/polydipsia. Denies hypo/ hyperglycemia symptoms - He denies new onset of: chest pain, exercise intolerance, shortness of breath, dizziness, visual changes, headache, lower extremity swelling or claudication.   Last diabetic eye exam was No results  found for: HMDIABEYEEXA  Foot exam- UTD  Last A1C in the office was:  Lab Results  Component Value Date   HGBA1C 7.0 09/30/2017   HGBA1C 6.5 07/02/2017   HGBA1C 6.2 03/28/2017    Lab Results  Component Value Date   LDLCALC 81 05/09/2017   CREATININE 0.76 05/09/2017   Hypertension -  His blood pressure has been controlled at home.  Pt is not regularly checking it at home.  - Yesterday his blood pressure was 120 over something.  Today it was 133  over something.  - Patient reports good compliance with blood pressure medications - see med list today.  - Denies medication S-E   - Smoking Status noted   - He denies new onset of: chest pain, exercise intolerance, shortness of breath, dizziness, visual changes, headache, lower extremity swelling or claudication.   Last 3 blood pressure readings in our office are as follows: BP Readings from Last 3 Encounters:  10/31/17 133/79  09/30/17 131/73  08/07/17 (!) 163/76    Filed Weights   10/31/17 1438  Weight: (!) 343 lb (155.6 kg)    BMI Readings from Last 3 Encounters:  10/31/17 45.25 kg/m  09/30/17 48.01 kg/m  08/07/17 46.52 kg/m     No problems updated.    Patient Care Team    Relationship Specialty Notifications Start End  [name], DO PCP - General Family Medicine  02/20/16   Daubert, Marlise Eves, MD  Orthopedic Surgery  04/11/16   Dian Situ, MD  Pain Medicine  04/11/16   Tanda Rockers, MD Consulting Physician Pulmonary Disease  04/11/16    Comment: Obstructive sleep apnea, restrictive lung disease due to severe morbid obesity  Awilda Metro, PA-C  Pain Medicine  07/23/17      Patient Active Problem List   Diagnosis Date Noted  . Vitamin D deficiency 05/08/2016    Priority: High  . Prediabetes: new onset 04/2016 05/08/2016    Priority: High  . Bilateral lower extremity pitting edema 04/11/2016    Priority: High  . Restrictive lung disease secondary to obesity 04/11/2016    Priority:  High  . Morbid obesity BMI >er 48 02/15/2012    Priority: High  . Chronic venous stasis dermatitis of both lower extremities 05/08/2016    Priority: Medium  . OSA on CPAP- seen Dr Melvyn Novas in past 04/11/2016    Priority: Medium  . Physical deconditioning 04/11/2016    Priority: Medium  . h/o Depressive disorder 04/11/2016    Priority: Medium  . History of tobacco use 04/11/2016    Priority: Low  . Postlaminectomy syndrome, lumbar region 01/04/2012    Priority: Low  . h/o Narcotic abuse 01/04/2012    Priority: Low  . Constipation 09/22/2011    Priority: Low  . Chronic back pain 09/22/2011    Priority: Low  . Type 2 diabetes mellitus with complication, without long-term current use of insulin ([place]) 09/30/2017  . Hypertension associated with diabetes ([place]) 09/30/2017  . Mixed diabetic hyperlipidemia associated with type 2 diabetes mellitus ([place]) 09/30/2017  . Arthritis of carpometacarpal [facility]) joint of left thumb 07/02/2017  . Bilateral leg cramps 05/09/2017  . acute Left heel pain 03/28/2017  . Tick bite 02/11/2017  . Barrett's esophagus 12/07/2016  . Chronic pain of left heel 11/27/2016  . Lumbar radiculopathy 07/21/2012  . Chronic pain due to injury 07/21/2012  . Back pain 01/04/2012  . Dyspnea 11/27/2011  . Confusion 09/22/2011  . Abdominal pain 09/22/2011  . Dehydration 09/22/2011  . Nausea & vomiting 09/22/2011     Past Medical History:  Diagnosis Date  . Allergic rhinitis due to pollen   . Arthritis   . Asthma   . Back pain   . Barrett esophagus   . Depressive disorder   . Exposure to hepatitis B   . Exposure to hepatitis C   . Narcotic abuse ([facility])    pain medications  . OSA (obstructive sleep apnea)    on CPAP      Past Surgical History:  Procedure  Laterality Date  . ABDOMINAL SURGERY    . BACK SURGERY     Rods and screws in lumbar area  . COLONOSCOPY    . ESOPHAGOGASTRODUODENOSCOPY  multiple  . HERNIA REPAIR     4  . JOINT REPLACEMENT    . TOTAL  KNEE ARTHROPLASTY  2009   left     Family History  Problem Relation Age of Onset  . Allergies Mother   . Early death Father   . Thyroid disease Sister   . Colon cancer Neg Hx   . Stomach cancer Neg Hx      Social History   Substance and Sexual Activity  Drug Use No   Comment: Prior abuse of narcotics  ,  Social History   Substance and Sexual Activity  Alcohol Use No  ,  Social History   Tobacco Use  Smoking Status Former Smoker  . Packs/day: 0.80  . Years: 15.00  . Pack years: 12.00  . Types: Cigarettes  . Last attempt to quit: 07/16/2004  . Years since quitting: 13.3  Smokeless Tobacco Never Used  ,    Current Outpatient Medications on File Prior to Visit  Medication Sig Dispense Refill  . albuterol (PROVENTIL HFA;VENTOLIN HFA) 108 (90 Base) MCG/ACT inhaler Inhale 2 puffs into the lungs every 6 (six) hours as needed for wheezing or shortness of breath.    . APPLE CIDER VINEGAR PO Take by mouth.    Marland Kitchen atorvastatin (LIPITOR) 20 MG tablet Take 1 tablet (20 mg total) by mouth at bedtime. 90 tablet 0  . B Complex Vitamins (B COMPLEX 100 PO) Take 1 capsule by mouth daily.    . blood glucose meter kit and supplies KIT Dispense based on patient and insurance preference. Use up to four times daily as directed. (FOR ICD-9 250.00, 250.01). 1 each 0  . Blood Glucose Monitoring Suppl (ONE TOUCH ULTRA MINI) w/Device KIT Use to test blood sugar, test in the morning fasting and test after largest meal of the day. 1 each 0  . Cholecalciferol (VITAMIN D3) 5000 units TABS 5,000 IU OTC vitamin D3 daily. 90 tablet 3  . cyclobenzaprine (FLEXERIL) 10 MG tablet TAKE 1 TABLET BY MOUTH TWICE A DAY 60 tablet 1  . glucose blood (ONE TOUCH ULTRA TEST) test strip Use to test blood sugar, test in the morning fasting and test after largest meal of the day. 100 each 11  . ibuprofen (ADVIL,MOTRIN) 200 MG tablet Take 200 mg by mouth every 6 (six) hours as needed for fever or mild pain.    . meloxicam  (MOBIC) 15 MG tablet TAKE 1 TABLET BY MOUTH EVERY DAY 30 tablet 1  . metFORMIN (GLUCOPHAGE) 500 MG tablet Take 2 tablets (1,000 mg total) by mouth 2 (two) times daily with a meal. 180 tablet 3  . Misc Natural Products (GINSENG COMPLEX PO) Take by mouth.    . Olmesartan-amLODIPine-HCTZ 20-5-12.5 MG TABS Take 0.5 tablets by mouth daily. p 1 wk inc to 1 po qd if BP not at goal of <130/80 90 tablet 0  . ONETOUCH DELICA LANCETS 19J MISC Use for testing blood sugar, test once in the morning fasting and test after largest meal of the day. 100 each 11  . Potassium 75 MG TABS Take 1 tablet by mouth daily at 8 pm.    . traMADol (ULTRAM) 50 MG tablet Take 50 mg by mouth 3 (three) times daily. For pain    . vitamin C (ASCORBIC ACID) 500 MG  tablet Take 500 mg by mouth daily.    . Vitamin D, Ergocalciferol, (DRISDOL) 50000 units CAPS capsule TAKE 1 CAPSULE WEEKLY 12 capsule 4  . zinc gluconate 50 MG tablet Take 50 mg by mouth daily.     No current facility-administered medications on file prior to visit.      No Known Allergies   Review of Systems:   General:  Denies fever, chills Optho/Auditory:   Denies visual changes, blurred vision Respiratory:   Denies SOB, cough, wheeze, DIB  Cardiovascular:   Denies chest pain, palpitations, painful respirations Gastrointestinal:   Denies nausea, vomiting, diarrhea.  Endocrine:     Denies new hot or cold intolerance Musculoskeletal:  Denies joint swelling, gait issues, or new unexplained myalgias/ arthralgias Skin:  Denies rash, suspicious lesions  Neurological:    Denies dizziness, unexplained weakness, numbness  Psychiatric/Behavioral:   Denies mood changes    Objective:     Blood pressure 133/79, pulse 73, height 6' 1"  (1.854 m), weight (!) 343 lb (155.6 kg), SpO2 97 %.  Body mass index is 45.25 kg/m.  General: Well Developed, well nourished, and in no acute distress.  HEENT: Normocephalic, atraumatic, pupils equal round reactive to light, neck  supple, No carotid bruits, no JVD Skin: Warm and dry, cap RF less 2 sec Cardiac: Regular rate and rhythm, S1, S2 WNL's, no murmurs rubs or gallops Respiratory: ECTA B/L, Not using accessory muscles, speaking in full sentences. NeuroM-Sk: Ambulates w/o assistance, moves ext * 4 w/o difficulty, sensation grossly intact.  Ext: scant edema b/l lower ext Psych: No HI/SI, judgement and insight good, Euthymic mood. Full Affect.

## 2017-11-06 ENCOUNTER — Encounter: Payer: Medicare Other | Attending: Family Medicine | Admitting: *Deleted

## 2017-11-06 DIAGNOSIS — Z713 Dietary counseling and surveillance: Secondary | ICD-10-CM | POA: Diagnosis not present

## 2017-11-06 DIAGNOSIS — E118 Type 2 diabetes mellitus with unspecified complications: Secondary | ICD-10-CM | POA: Diagnosis not present

## 2017-11-06 NOTE — Patient Instructions (Signed)
Plan:  Aim for 4 Carb Choices per meal (60 grams) +/- 1 either way  Aim for 0-2 Carbs per snack if hungry  Include protein in moderation with your meals and snacks Consider reading food labels for Total Carbohydrate of foods Consider  increasing your activity level daily as tolerated Consider checking BG at alternate times per day as directed by MD  Continue taking medication as directed by MD

## 2017-11-06 NOTE — Progress Notes (Signed)
Diabetes Self-Management Education  Visit Type: First/Initial  Appt. Start Time: 0940 Appt. End Time: 1110  11/06/2017  [name], identified by name and date of birth, is a 62 y.o. male with a diagnosis of Diabetes: Type 2. He states he is newly diagnosed but expresses understanding of what he has learned on his own. He walks with a cane, stating a back injury. He is working on getting a meter either from [employer] or from [facility]. Diet history indicates he gets a good variety of all food groups and he states a weight loss recently of about 10 pounds.  ASSESSMENT  There were no vitals taken for this visit. There is no height or weight on file to calculate BMI.  Diabetes Self-Management Education - 11/06/17 0954      Visit Information   Visit Type  First/Initial      Initial Visit   Diabetes Type  Type 2    Are you currently following a meal plan?  Yes    What type of meal plan do you follow?  low carb and no sugars    Are you taking your medications as prescribed?  Yes    Date Diagnosed  09/2017      Health Coping   How would you rate your overall health?  Good      Psychosocial Assessment   Patient Belief/Attitude about Diabetes  Motivated to manage diabetes    Self-care barriers  Unsteady gait/risk for falls    Self-management support  Family    Other persons present  Patient    Patient Concerns  Nutrition/Meal planning;Glycemic Control    Special Needs  None    Learning Readiness  Change in progress    What is the last grade level you completed in school?  12      Pre-Education Assessment   Patient understands incorporating nutritional management into lifestyle.  Needs Instruction    Patient undertands incorporating physical activity into lifestyle.  Needs Instruction    Patient understands using medications safely.  Needs Instruction    Patient understands monitoring blood glucose, interpreting and using results  Needs Instruction    Patient understands prevention,  detection, and treatment of acute complications.  Needs Instruction    Patient understands prevention, detection, and treatment of chronic complications.  Needs Instruction    Patient understands how to develop strategies to address psychosocial issues.  Needs Instruction    Patient understands how to develop strategies to promote health/change behavior.  Needs Instruction      Complications   Last HgB A1C per patient/outside source  7 %    How often do you check your blood sugar?  0 times/day (not testing)    Have you had a dilated eye exam in the past 12 months?  No    Have you had a dental exam in the past 12 months?  Yes    Are you checking your feet?  Yes    How many days per week are you checking your feet?  4      Dietary Intake   Breakfast  regular oatmeal with honey or Stevia OR unsweetened cereal with almond milk or eats plain    Lunch  skips a few days a week OR 2 apples with PNB, raw vegetables    Snack (afternoon)  occasionally trail mix, wasabi almonds    Dinner  lean meat and cheese sandwich on whole grain bread x 2 OR low carb squash spaghetti with meat sauce OR  lean meat casserole    Snack (evening)  not usually    Beverage(s)  tea with half and half and Stevia, water      Exercise   Exercise Type  ADL's    How many days per week to you exercise?  0    How many minutes per day do you exercise?  0    Total minutes per week of exercise  0      Patient Education   Previous Diabetes Education  No    Disease state   Definition of diabetes, type 1 and 2, and the diagnosis of diabetes;Factors that contribute to the development of diabetes    Nutrition management   Role of diet in the treatment of diabetes and the relationship between the three main macronutrients and blood glucose level;Carbohydrate counting;Food label reading, portion sizes and measuring food.;Reviewed blood glucose goals for pre and post meals and how to evaluate the patients' food intake on their blood  glucose level.    Physical activity and exercise   Role of exercise on diabetes management, blood pressure control and cardiac health.    Medications  Reviewed patients medication for diabetes, action, purpose, timing of dose and side effects.    Monitoring  Purpose and frequency of SMBG.;Identified appropriate SMBG and/or A1C goals.    Chronic complications  Relationship between chronic complications and blood glucose control;Lipid levels, blood glucose control and heart disease    Psychosocial adjustment  Role of stress on diabetes      Individualized Goals (developed by patient)   Nutrition  Follow meal plan discussed;General guidelines for healthy choices and portions discussed    Physical Activity  Exercise 3-5 times per week    Medications  take my medication as prescribed    Monitoring   test blood glucose pre and post meals as discussed      Post-Education Assessment   Patient understands the diabetes disease and treatment process.  Demonstrates understanding / competency    Patient understands incorporating nutritional management into lifestyle.  Demonstrates understanding / competency    Patient undertands incorporating physical activity into lifestyle.  Demonstrates understanding / competency    Patient understands using medications safely.  Demonstrates understanding / competency    Patient understands monitoring blood glucose, interpreting and using results  Demonstrates understanding / competency    Patient understands prevention, detection, and treatment of chronic complications.  Demonstrates understanding / competency    Patient understands how to develop strategies to address psychosocial issues.  Demonstrates understanding / competency    Patient understands how to develop strategies to promote health/change behavior.  Demonstrates understanding / competency      Outcomes   Future DMSE  PRN    Program Status  Completed       Individualized Plan for Diabetes  Self-Management Training:   Learning Objective:  Patient will have a greater understanding of diabetes self-management. Patient education plan is to attend individual and/or group sessions per assessed needs and concerns.   Plan:   Patient Instructions  Plan:  Aim for 4 Carb Choices per meal (60 grams) +/- 1 either way  Aim for 0-2 Carbs per snack if hungry  Include protein in moderation with your meals and snacks Consider reading food labels for Total Carbohydrate of foods Consider  increasing your activity level daily as tolerated Consider checking BG at alternate times per day as directed by MD  Continue taking medication as directed by MD  Expected Outcomes:  Education material provided: Living Well with Diabetes, Food label handouts, A1C conversion sheet, Meal plan card and Carbohydrate counting sheet  If problems or questions, patient to contact team via:  Phone  Future DSME appointment: PRN

## 2017-11-13 DIAGNOSIS — M5116 Intervertebral disc disorders with radiculopathy, lumbar region: Secondary | ICD-10-CM | POA: Diagnosis not present

## 2017-11-13 DIAGNOSIS — M9905 Segmental and somatic dysfunction of pelvic region: Secondary | ICD-10-CM | POA: Diagnosis not present

## 2017-11-13 DIAGNOSIS — M9901 Segmental and somatic dysfunction of cervical region: Secondary | ICD-10-CM | POA: Diagnosis not present

## 2017-11-13 DIAGNOSIS — M542 Cervicalgia: Secondary | ICD-10-CM | POA: Diagnosis not present

## 2017-11-13 DIAGNOSIS — M461 Sacroiliitis, not elsewhere classified: Secondary | ICD-10-CM | POA: Diagnosis not present

## 2017-11-13 DIAGNOSIS — M9904 Segmental and somatic dysfunction of sacral region: Secondary | ICD-10-CM | POA: Diagnosis not present

## 2017-11-17 ENCOUNTER — Other Ambulatory Visit: Payer: Self-pay | Admitting: Family Medicine

## 2017-11-17 DIAGNOSIS — M159 Polyosteoarthritis, unspecified: Secondary | ICD-10-CM

## 2017-11-17 DIAGNOSIS — G8929 Other chronic pain: Secondary | ICD-10-CM

## 2017-11-17 DIAGNOSIS — M79645 Pain in left finger(s): Principal | ICD-10-CM

## 2017-11-18 ENCOUNTER — Telehealth: Payer: Self-pay | Admitting: Family Medicine

## 2017-11-18 ENCOUNTER — Other Ambulatory Visit: Payer: Self-pay

## 2017-11-18 DIAGNOSIS — E118 Type 2 diabetes mellitus with unspecified complications: Secondary | ICD-10-CM

## 2017-11-18 DIAGNOSIS — M545 Low back pain: Secondary | ICD-10-CM | POA: Diagnosis not present

## 2017-11-18 DIAGNOSIS — G894 Chronic pain syndrome: Secondary | ICD-10-CM | POA: Diagnosis not present

## 2017-11-18 DIAGNOSIS — M47816 Spondylosis without myelopathy or radiculopathy, lumbar region: Secondary | ICD-10-CM | POA: Diagnosis not present

## 2017-11-18 DIAGNOSIS — Z79899 Other long term (current) drug therapy: Secondary | ICD-10-CM | POA: Diagnosis not present

## 2017-11-18 DIAGNOSIS — Z79891 Long term (current) use of opiate analgesic: Secondary | ICD-10-CM | POA: Diagnosis not present

## 2017-11-18 MED ORDER — METFORMIN HCL 500 MG PO TABS: 1000.0000 mg | ORAL_TABLET | Freq: Two times a day (BID) | ORAL | 1 refills | Status: DC

## 2017-11-18 NOTE — Telephone Encounter (Signed)
Patient called states he needs a (90 day) supply count of the Metformin---  Pt says pharmacy refill amount to not (Match the quantity needed as prescribed for him that will last for 3 months.:  metFORMIN (GLUCOPHAGE) 500 MG tablet [[id number]]   Order Details  Dose: 1,000 mg Route: Oral Frequency: 2 times daily with meals  Indications of Use: Type 2 Diabetes Mellitus  Dispense Quantity: 180 tablet Refills: 3 Fills remaining: --        Sig: Take 2 tablets (1,000 mg total) by mouth 2 (two) times daily with a meal.          Per patient the count should be 360 tablets  4 pill daily = 2 pills ( two times daily.)  ---Forwarding to medical assistant.  --Fausto Skillern

## 2017-11-18 NOTE — Telephone Encounter (Signed)
Refill for 90 day supply. MPulliam, CMA/RT(R)

## 2017-11-18 NOTE — Telephone Encounter (Signed)
Sent in for 90 day supply.  Patient notified. MPulliam, CMA/RT(R)

## 2017-11-20 DIAGNOSIS — M5116 Intervertebral disc disorders with radiculopathy, lumbar region: Secondary | ICD-10-CM | POA: Diagnosis not present

## 2017-11-20 DIAGNOSIS — M9904 Segmental and somatic dysfunction of sacral region: Secondary | ICD-10-CM | POA: Diagnosis not present

## 2017-11-20 DIAGNOSIS — M9905 Segmental and somatic dysfunction of pelvic region: Secondary | ICD-10-CM | POA: Diagnosis not present

## 2017-11-20 DIAGNOSIS — M9901 Segmental and somatic dysfunction of cervical region: Secondary | ICD-10-CM | POA: Diagnosis not present

## 2017-11-20 DIAGNOSIS — M461 Sacroiliitis, not elsewhere classified: Secondary | ICD-10-CM | POA: Diagnosis not present

## 2017-11-20 DIAGNOSIS — M542 Cervicalgia: Secondary | ICD-10-CM | POA: Diagnosis not present

## 2017-11-27 DIAGNOSIS — M9901 Segmental and somatic dysfunction of cervical region: Secondary | ICD-10-CM | POA: Diagnosis not present

## 2017-11-27 DIAGNOSIS — M5116 Intervertebral disc disorders with radiculopathy, lumbar region: Secondary | ICD-10-CM | POA: Diagnosis not present

## 2017-11-27 DIAGNOSIS — M542 Cervicalgia: Secondary | ICD-10-CM | POA: Diagnosis not present

## 2017-11-27 DIAGNOSIS — M461 Sacroiliitis, not elsewhere classified: Secondary | ICD-10-CM | POA: Diagnosis not present

## 2017-11-27 DIAGNOSIS — M9904 Segmental and somatic dysfunction of sacral region: Secondary | ICD-10-CM | POA: Diagnosis not present

## 2017-11-27 DIAGNOSIS — M9905 Segmental and somatic dysfunction of pelvic region: Secondary | ICD-10-CM | POA: Diagnosis not present

## 2017-12-03 ENCOUNTER — Telehealth: Payer: Self-pay | Admitting: Family Medicine

## 2017-12-03 NOTE — Telephone Encounter (Signed)
Please send script for glucometer- whichever one pt prefers through his insurance.

## 2017-12-03 NOTE — Telephone Encounter (Signed)
Patient needs open prescription for glucose meter, lancets and [facility] strips [facility] [address] For [facility] to determine what meter Medicare will cover.

## 2017-12-04 ENCOUNTER — Other Ambulatory Visit: Payer: Self-pay | Admitting: Family Medicine

## 2017-12-04 ENCOUNTER — Other Ambulatory Visit: Payer: Self-pay

## 2017-12-04 ENCOUNTER — Other Ambulatory Visit: Payer: Medicare Other

## 2017-12-04 DIAGNOSIS — E1159 Type 2 diabetes mellitus with other circulatory complications: Secondary | ICD-10-CM | POA: Diagnosis not present

## 2017-12-04 DIAGNOSIS — E118 Type 2 diabetes mellitus with unspecified complications: Secondary | ICD-10-CM | POA: Diagnosis not present

## 2017-12-04 DIAGNOSIS — E782 Mixed hyperlipidemia: Secondary | ICD-10-CM | POA: Diagnosis not present

## 2017-12-04 DIAGNOSIS — I10 Essential (primary) hypertension: Secondary | ICD-10-CM

## 2017-12-04 DIAGNOSIS — M159 Polyosteoarthritis, unspecified: Secondary | ICD-10-CM

## 2017-12-04 DIAGNOSIS — E1169 Type 2 diabetes mellitus with other specified complication: Secondary | ICD-10-CM

## 2017-12-04 DIAGNOSIS — R7303 Prediabetes: Secondary | ICD-10-CM

## 2017-12-04 MED ORDER — BLOOD GLUCOSE METER KIT: PACK | 0 refills | Status: AC

## 2017-12-04 NOTE — Telephone Encounter (Signed)
RX was faxed over to the pharmacy. MPulliam, CMA/RT(R)

## 2017-12-05 LAB — COMPREHENSIVE METABOLIC PANEL
ALK PHOS: 66 IU/L (ref 39–117)
ALT: 44 IU/L (ref 0–44)
AST: 28 IU/L (ref 0–40)
Albumin/Globulin Ratio: 1.9 (ref 1.2–2.2)
Albumin: 4.3 g/dL (ref 3.6–4.8)
BILIRUBIN TOTAL: 0.4 mg/dL (ref 0.0–1.2)
BUN/Creatinine Ratio: 16 (ref 10–24)
BUN: 14 mg/dL (ref 8–27)
CHLORIDE: 103 mmol/L (ref 96–106)
CO2: 25 mmol/L (ref 20–29)
Calcium: 9.7 mg/dL (ref 8.6–10.2)
Creatinine, Ser: 0.89 mg/dL (ref 0.76–1.27)
GFR calc Af Amer: 105 mL/min/{1.73_m2} (ref >59)
GFR calc non Af Amer: 91 mL/min/{1.73_m2} (ref >59)
GLUCOSE: 124 mg/dL — AB (ref 65–99)
Globulin, Total: 2.3 g/dL (ref 1.5–4.5)
Potassium: 5 mmol/L (ref 3.5–5.2)
Sodium: 141 mmol/L (ref 134–144)
TOTAL PROTEIN: 6.6 g/dL (ref 6.0–8.5)

## 2017-12-18 DIAGNOSIS — M542 Cervicalgia: Secondary | ICD-10-CM | POA: Diagnosis not present

## 2017-12-18 DIAGNOSIS — M47816 Spondylosis without myelopathy or radiculopathy, lumbar region: Secondary | ICD-10-CM | POA: Diagnosis not present

## 2017-12-18 DIAGNOSIS — M9905 Segmental and somatic dysfunction of pelvic region: Secondary | ICD-10-CM | POA: Diagnosis not present

## 2017-12-18 DIAGNOSIS — G894 Chronic pain syndrome: Secondary | ICD-10-CM | POA: Diagnosis not present

## 2017-12-18 DIAGNOSIS — M545 Low back pain: Secondary | ICD-10-CM | POA: Diagnosis not present

## 2017-12-18 DIAGNOSIS — M9901 Segmental and somatic dysfunction of cervical region: Secondary | ICD-10-CM | POA: Diagnosis not present

## 2017-12-18 DIAGNOSIS — M461 Sacroiliitis, not elsewhere classified: Secondary | ICD-10-CM | POA: Diagnosis not present

## 2017-12-18 DIAGNOSIS — M5116 Intervertebral disc disorders with radiculopathy, lumbar region: Secondary | ICD-10-CM | POA: Diagnosis not present

## 2017-12-18 DIAGNOSIS — M9904 Segmental and somatic dysfunction of sacral region: Secondary | ICD-10-CM | POA: Diagnosis not present

## 2017-12-23 ENCOUNTER — Other Ambulatory Visit: Payer: Self-pay | Admitting: Family Medicine

## 2018-01-08 DIAGNOSIS — M9905 Segmental and somatic dysfunction of pelvic region: Secondary | ICD-10-CM | POA: Diagnosis not present

## 2018-01-08 DIAGNOSIS — M5116 Intervertebral disc disorders with radiculopathy, lumbar region: Secondary | ICD-10-CM | POA: Diagnosis not present

## 2018-01-08 DIAGNOSIS — M461 Sacroiliitis, not elsewhere classified: Secondary | ICD-10-CM | POA: Diagnosis not present

## 2018-01-08 DIAGNOSIS — M9904 Segmental and somatic dysfunction of sacral region: Secondary | ICD-10-CM | POA: Diagnosis not present

## 2018-01-08 DIAGNOSIS — M542 Cervicalgia: Secondary | ICD-10-CM | POA: Diagnosis not present

## 2018-01-08 DIAGNOSIS — M9901 Segmental and somatic dysfunction of cervical region: Secondary | ICD-10-CM | POA: Diagnosis not present

## 2018-01-09 ENCOUNTER — Ambulatory Visit (INDEPENDENT_AMBULATORY_CARE_PROVIDER_SITE_OTHER): Payer: Medicare Other | Admitting: Family Medicine

## 2018-01-09 VITALS — BP 110/71 | HR 62 | Ht 73.0 in | Wt 342.6 lb

## 2018-01-09 DIAGNOSIS — E1159 Type 2 diabetes mellitus with other circulatory complications: Secondary | ICD-10-CM

## 2018-01-09 DIAGNOSIS — L259 Unspecified contact dermatitis, unspecified cause: Secondary | ICD-10-CM | POA: Diagnosis not present

## 2018-01-09 DIAGNOSIS — M1990 Unspecified osteoarthritis, unspecified site: Secondary | ICD-10-CM | POA: Insufficient documentation

## 2018-01-09 DIAGNOSIS — I10 Essential (primary) hypertension: Secondary | ICD-10-CM | POA: Diagnosis not present

## 2018-01-09 DIAGNOSIS — M159 Polyosteoarthritis, unspecified: Secondary | ICD-10-CM

## 2018-01-09 DIAGNOSIS — R600 Localized edema: Secondary | ICD-10-CM | POA: Diagnosis not present

## 2018-01-09 DIAGNOSIS — E118 Type 2 diabetes mellitus with unspecified complications: Secondary | ICD-10-CM | POA: Diagnosis not present

## 2018-01-09 DIAGNOSIS — Z723 Lack of physical exercise: Secondary | ICD-10-CM

## 2018-01-09 LAB — POCT GLYCOSYLATED HEMOGLOBIN (HGB A1C): Hemoglobin A1C: 6.1 % — AB (ref 4.0–5.6)

## 2018-01-09 MED ORDER — TRIAMCINOLONE ACETONIDE 0.5 % EX CREA: 1.0000 "application " | TOPICAL_CREAM | Freq: Two times a day (BID) | CUTANEOUS | 0 refills | Status: DC

## 2018-01-09 MED ORDER — OLMESARTAN-AMLODIPINE-HCTZ 20-5-12.5 MG PO TABS: ORAL_TABLET | ORAL | 3 refills | Status: DC

## 2018-01-09 MED ORDER — MELOXICAM 15 MG PO TABS: 15.0000 mg | ORAL_TABLET | Freq: Every day | ORAL | 1 refills | Status: DC

## 2018-01-09 NOTE — Patient Instructions (Signed)
Use lose it app to track foods

## 2018-01-09 NOTE — Progress Notes (Signed)
Impression and Recommendations:    1. Type 2 diabetes mellitus with complication, without long-term current use of insulin ([place])   2. Hypertension associated with diabetes ([place])   3. Morbid obesity BMI >er 48   4. Inactivity   5. Bilateral lower extremity pitting edema   6. Generalized osteoarthritis   7. Contact dermatitis and eczema     1. DM2 -A1c today is 6.1, significantly improved from prior where it was 7.0 on 09-30-17.  -continue prudent diet/exercise.  -reduce intake of sweets and carbohydrates.  -continue meds 1000 mg BID metformin. -obtain your glucometer and lancets. Check your BS at home and keep a log. Bring this into next OV.   2. HTN -BP well controlled at this time. Continue meds.  -check BP at home and keep a log. Bring this into next OV.  -refill given.   3. Obesity/inactivity -strongly encouraged pt to lose weight in order to reduce his risk of disease. -prudent diet/exercise discussed -recommend tracking daily food intake using the Lose It or My Fitness Pal apps. -move more.  4. B/l pitting edema -stable from prior. Continue to monitor. -move more. -drink adequate amounts of water, equal to half of your wt in oz per day.     5. OA -refill meloxicam given. Continue meds.  6. Dermatitis -start 50% eucerin and 50% triamcinolone.  -Encouraged pt to not neglect his own personal health and wellbeing for his family's needs. Counseled pt in great length on this, which lengthened today's OV.  Orders Placed This Encounter  Procedures  . POCT glycosylated hemoglobin (Hb A1C)    Meds ordered this encounter  Medications  . Olmesartan-amLODIPine-HCTZ 20-5-12.5 MG TABS    Sig: 1 tab po qd    Dispense:  90 tablet    Refill:  3  . meloxicam (MOBIC) 15 MG tablet    Sig: Take 1 tablet (15 mg total) by mouth daily.    Dispense:  90 tablet    Refill:  1  . triamcinolone cream (KENALOG) 0.5 %    Sig: Apply 1 application topically 2 (two) times  daily. Mix with Eucerin cream 1:1 ratio and apply to arms BID prn    Dispense:  454 g    Refill:  0    Gross side effects, risk and benefits, and alternatives of medications and treatment plan in general discussed with patient.  Patient is aware that all medications have potential side effects and we are unable to predict every side effect or drug-drug interaction that may occur.   Patient will call with any questions prior to using medication if they have concerns.  Expresses verbal understanding and consents to current therapy and treatment regimen.  No barriers to understanding were identified.  Red flag symptoms and signs discussed in detail.  Patient expressed understanding regarding what to do in case of emergency\urgent symptoms  Please see AVS handed out to patient at the end of our visit for further patient instructions/ counseling done pertaining to today's office visit.   Return for 3-4 mo f/up for DM, BP, CHol etc.    Note: This note was prepared with assistance of Dragon voice recognition software. Occasional wrong-word or sound-a-like substitutions may have occurred due to the inherent limitations of voice recognition software.   This document serves as a record of services personally performed by [name], DO. It was created on her behalf by Mayer Masker, a trained medical scribe. The creation of this record is based on  the scribe's personal observations and the provider's statements to them.   I have reviewed the above medical documentation for accuracy and completeness and I concur.  [name] 01/12/18 11:40 PM  --------------------------------------------------------------------------------------------------------------------------------------------------------------------------------------------------------------------------------------------    Subjective:     HPI: Robert [name] is a 62 y.o. male who presents to [facility]  today for issues as discussed below.  Skin He has had an itchy rash on bilateral arms that has spread slowly over the last 3 weeks.  He does not know where he might have been exposed to anything.  He has no h/o allergies to cats, but notes he has 5 cats at home.  He has been using an unknown cream to the area that dries out his arms.    DM HPI: A1c today is 6.1, slightly improved from prior where it was 7.0 on 09-30-17.   -  He has not been working on exercise for diabetes. He does not have a kit yet to check- he states he had to budget his money to other things recently.   He has been walking 2-3 times a week, but not daily. He states his recent weight gain has been because of not walking /  being less active recently. He is joining the [employer] for a [employer]. He wants to go 3 days a week.   He has been changing his diet by eating more fruits/veggies, less red meat, etc. He does not really splurge on weekends, but after dinner at night he will eat a few cookies. He cuts out a lot of his sweets.    Pt is currently maintained on the following medications for diabetes:   see med list today.   Medication compliance - yes.  Home glucose readings range: NA- he is not checking at home.    Denies polyuria/polydipsia.  Denies hypo/ hyperglycemia symptoms  Last diabetic eye exam was No results found for: HMDIABEYEEXA  Foot exam- UTD  Last A1C in the office was:  Lab Results  Component Value Date   HGBA1C 6.1 (A) 01/09/2018   HGBA1C 7.0 09/30/2017   HGBA1C 6.5 07/02/2017    Lab Results  Component Value Date   LDLCALC 81 05/09/2017   CREATININE 0.89 12/04/2017    Wt Readings from Last 3 Encounters:  01/09/18 (!) 342 lb 9.6 oz (155.4 kg)  10/31/17 (!) 343 lb (155.6 kg)  09/30/17 (!) 354 lb (160.6 kg)    BP Readings from Last 3 Encounters:  01/09/18 110/71  10/31/17 133/79  09/30/17 131/73     Wt Readings from Last 3 Encounters:  01/09/18 (!) 342 lb 9.6 oz (155.4 kg)    10/31/17 (!) 343 lb (155.6 kg)  09/30/17 (!) 354 lb (160.6 kg)   BP Readings from Last 3 Encounters:  01/09/18 110/71  10/31/17 133/79  09/30/17 131/73   Pulse Readings from Last 3 Encounters:  01/09/18 62  10/31/17 73  09/30/17 73   BMI Readings from Last 3 Encounters:  01/09/18 45.20 kg/m  10/31/17 45.25 kg/m  09/30/17 48.01 kg/m     Patient Care Team    Relationship Specialty Notifications Start End  [name], DO PCP - General Family Medicine  02/20/16   Artist Pais, MD  Orthopedic Surgery  04/11/16   Dian Situ, MD  Pain Medicine  04/11/16   Tanda Rockers, MD Consulting Physician Pulmonary Disease  04/11/16    Comment: Obstructive sleep apnea, restrictive lung disease due to severe morbid obesity  Awilda Metro, PA-C  Pain Medicine  07/23/17      Patient Active Problem List   Diagnosis Date Noted  . Type 2 diabetes mellitus with complication, without long-term current use of insulin ([place]) 09/30/2017    Priority: High  . Hypertension associated with diabetes ([place]) 09/30/2017    Priority: High  . Mixed diabetic hyperlipidemia associated with type 2 diabetes mellitus ([place]) 09/30/2017    Priority: High  . Vitamin D deficiency 05/08/2016    Priority: High  . Bilateral lower extremity pitting edema 04/11/2016    Priority: High  . Restrictive lung disease secondary to obesity 04/11/2016    Priority: High  . Morbid obesity BMI >er 48 02/15/2012    Priority: High  . Chronic venous stasis dermatitis of both lower extremities 05/08/2016    Priority: Medium  . OSA on CPAP- seen Dr Melvyn Novas in past 04/11/2016    Priority: Medium  . Physical deconditioning 04/11/2016    Priority: Medium  . h/o Depressive disorder 04/11/2016    Priority: Medium  . History of tobacco use 04/11/2016    Priority: Low  . Postlaminectomy syndrome, lumbar region 01/04/2012    Priority: Low  . h/o Narcotic abuse 01/04/2012    Priority: Low  . Constipation 09/22/2011     Priority: Low  . Chronic back pain 09/22/2011    Priority: Low  . Inactivity 01/09/2018  . Generalized osteoarthritis 01/09/2018  . Contact dermatitis and eczema 01/09/2018  . Arthritis of carpometacarpal [facility]) joint of left thumb 07/02/2017  . Bilateral leg cramps 05/09/2017  . acute Left heel pain 03/28/2017  . Tick bite 02/11/2017  . Barrett's esophagus 12/07/2016  . Chronic pain of left heel 11/27/2016  . Lumbar radiculopathy 07/21/2012  . Chronic pain due to injury 07/21/2012  . Back pain 01/04/2012  . Dyspnea 11/27/2011  . Confusion 09/22/2011  . Abdominal pain 09/22/2011  . Dehydration 09/22/2011  . Nausea & vomiting 09/22/2011    Past Medical history, Surgical history, Family history, Social history, Allergies and Medications have been entered into the medical record, reviewed and changed as needed.    Current Meds  Medication Sig  . albuterol (PROVENTIL HFA;VENTOLIN HFA) 108 (90 Base) MCG/ACT inhaler Inhale 2 puffs into the lungs every 6 (six) hours as needed for wheezing or shortness of breath.  . APPLE CIDER VINEGAR PO Take by mouth.  Marland Kitchen atorvastatin (LIPITOR) 20 MG tablet TAKE 1 TABLET BY MOUTH EVERYDAY AT BEDTIME  . B Complex Vitamins (B COMPLEX 100 PO) Take 1 capsule by mouth daily.  . blood glucose meter kit and supplies KIT Dispense based on patient and insurance preference. Use up to four times daily as directed. (FOR ICD-9 250.00, 250.01).  . blood glucose meter kit and supplies Dispense based on patient and insurance preference. Use to check glucose fasting in the AM and then after largest meal of the day.. (FOR ICD-10 E10.9, E11.9).  Marland Kitchen Blood Glucose Monitoring Suppl (ONE TOUCH ULTRA MINI) w/Device KIT Use to test blood sugar, test in the morning fasting and test after largest meal of the day.  . Cholecalciferol (VITAMIN D3) 5000 units TABS 5,000 IU OTC vitamin D3 daily.  . cyclobenzaprine (FLEXERIL) 10 MG tablet TAKE 1 TABLET BY MOUTH TWICE A DAY  . glucose  blood (ONE TOUCH ULTRA TEST) test strip Use to test blood sugar, test in the morning fasting and test after largest meal of the day.  . ibuprofen (ADVIL,MOTRIN) 200 MG tablet Take 200 mg by mouth  every 6 (six) hours as needed for fever or mild pain.  . meloxicam (MOBIC) 15 MG tablet Take 1 tablet (15 mg total) by mouth daily.  . metFORMIN (GLUCOPHAGE) 500 MG tablet Take 2 tablets (1,000 mg total) by mouth 2 (two) times daily with a meal.  . Misc Natural Products (GINSENG COMPLEX PO) Take by mouth.  . Olmesartan-amLODIPine-HCTZ 20-5-12.5 MG TABS 1 tab po qd  . ONETOUCH DELICA LANCETS 48O MISC Use for testing blood sugar, test once in the morning fasting and test after largest meal of the day.  . Potassium 75 MG TABS Take 1 tablet by mouth daily at 8 pm.  . traMADol (ULTRAM) 50 MG tablet Take 100 mg by mouth 3 (three) times daily. For pain  . vitamin C (ASCORBIC ACID) 500 MG tablet Take 500 mg by mouth daily.  . Vitamin D, Ergocalciferol, (DRISDOL) 50000 units CAPS capsule TAKE 1 CAPSULE WEEKLY  . zinc gluconate 50 MG tablet Take 50 mg by mouth daily.  . [DISCONTINUED] meloxicam (MOBIC) 15 MG tablet TAKE 1 TABLET BY MOUTH EVERY DAY  . [DISCONTINUED] Olmesartan-amLODIPine-HCTZ 20-5-12.5 MG TABS TAKE 0.5 TABLETS BY MOUTH DAILY. IN 1 WK INCREASE TO 1 TAB DAILY IF BP NOT AT GOAL OF <130/80    Allergies:  No Known Allergies   Review of Systems:  A fourteen system review of systems was performed and found to be positive as per HPI.   Objective:   Blood pressure 110/71, pulse 62, height _0  (1.854 m), weight (!) 342 lb 9.6 oz (155.4 kg), SpO2 98 %. Body mass index is 45.2 kg/m. General:  Well Developed, well nourished, appropriate for stated age.  Neuro:  Alert and oriented,  extra-ocular muscles intact  HEENT:  Normocephalic, atraumatic, neck supple, no carotid bruits appreciated  Skin:  no gross rash, warm, pink. Cardiac:  RRR, S1 S2 Respiratory:  ECTA B/L and A/P, Not using accessory  muscles, speaking in full sentences- unlabored. Vascular:  Ext warm, no cyanosis apprec.; cap RF less 2 sec. 2+ pitting edema bilaterally. Psych:  No HI/SI, judgement and insight good, Euthymic mood. Full Affect. Skin: dry skin bilateral forearms with scattered, erythematous papules. Some excoriated from scratching from elbow down to wrist. Few scattered on dorsal surface of palm.

## 2018-01-10 ENCOUNTER — Telehealth: Payer: Self-pay | Admitting: Family Medicine

## 2018-01-10 NOTE — Telephone Encounter (Signed)
Patient cld states his insurance does Not cover he Delilah Shanurissa? Cream/ Oinment Dr. Val Eagle prescribed & he wishes her to call in a different steroid cream.   Forwarding message to medical assistant to call patient to discuss.  --glh

## 2018-01-12 NOTE — Telephone Encounter (Signed)
Call pt and see what he med is talking about please

## 2018-01-13 NOTE — Telephone Encounter (Signed)
Called the patient to verify medication, no answer. Patient was prescribed triamcinolone 5% cream at his office visit on 01/09/2018, this should be the cream that the patient is talking about. MPulliam, CMA/RT(R)

## 2018-01-14 ENCOUNTER — Other Ambulatory Visit: Payer: Self-pay

## 2018-01-14 DIAGNOSIS — L259 Unspecified contact dermatitis, unspecified cause: Secondary | ICD-10-CM

## 2018-01-14 MED ORDER — TRIAMCINOLONE 0.1 % CREAM:EUCERIN CREAM 1:1: 1.0000 "application " | TOPICAL_CREAM | Freq: Two times a day (BID) | CUTANEOUS | 1 refills | Status: DC

## 2018-01-14 NOTE — Telephone Encounter (Signed)
Sent to pharmacy and patient notified. MPulliam, CMA/RT(R)

## 2018-01-14 NOTE — Telephone Encounter (Signed)
Take TAC 0.1% cream then  Apply BID Supply 80gr tube, 1 RF

## 2018-01-14 NOTE — Progress Notes (Signed)
Change in medication due to insurance not covering the 0.5% cream.  Changed per Dr. Sharee Holsterpalski see pervious note. MPulliam, CMA/RT(R)

## 2018-01-15 DIAGNOSIS — M542 Cervicalgia: Secondary | ICD-10-CM | POA: Diagnosis not present

## 2018-01-15 DIAGNOSIS — M9901 Segmental and somatic dysfunction of cervical region: Secondary | ICD-10-CM | POA: Diagnosis not present

## 2018-01-15 DIAGNOSIS — M9905 Segmental and somatic dysfunction of pelvic region: Secondary | ICD-10-CM | POA: Diagnosis not present

## 2018-01-15 DIAGNOSIS — M5116 Intervertebral disc disorders with radiculopathy, lumbar region: Secondary | ICD-10-CM | POA: Diagnosis not present

## 2018-01-15 DIAGNOSIS — M461 Sacroiliitis, not elsewhere classified: Secondary | ICD-10-CM | POA: Diagnosis not present

## 2018-01-15 DIAGNOSIS — M9904 Segmental and somatic dysfunction of sacral region: Secondary | ICD-10-CM | POA: Diagnosis not present

## 2018-01-20 DIAGNOSIS — Z79899 Other long term (current) drug therapy: Secondary | ICD-10-CM | POA: Diagnosis not present

## 2018-01-20 DIAGNOSIS — M47816 Spondylosis without myelopathy or radiculopathy, lumbar region: Secondary | ICD-10-CM | POA: Diagnosis not present

## 2018-01-20 DIAGNOSIS — Z79891 Long term (current) use of opiate analgesic: Secondary | ICD-10-CM | POA: Diagnosis not present

## 2018-01-20 DIAGNOSIS — G894 Chronic pain syndrome: Secondary | ICD-10-CM | POA: Diagnosis not present

## 2018-01-22 DIAGNOSIS — M542 Cervicalgia: Secondary | ICD-10-CM | POA: Diagnosis not present

## 2018-01-22 DIAGNOSIS — M5116 Intervertebral disc disorders with radiculopathy, lumbar region: Secondary | ICD-10-CM | POA: Diagnosis not present

## 2018-01-22 DIAGNOSIS — M9905 Segmental and somatic dysfunction of pelvic region: Secondary | ICD-10-CM | POA: Diagnosis not present

## 2018-01-22 DIAGNOSIS — M9901 Segmental and somatic dysfunction of cervical region: Secondary | ICD-10-CM | POA: Diagnosis not present

## 2018-01-22 DIAGNOSIS — M461 Sacroiliitis, not elsewhere classified: Secondary | ICD-10-CM | POA: Diagnosis not present

## 2018-01-22 DIAGNOSIS — M9904 Segmental and somatic dysfunction of sacral region: Secondary | ICD-10-CM | POA: Diagnosis not present

## 2018-02-05 ENCOUNTER — Other Ambulatory Visit: Payer: Self-pay | Admitting: Family Medicine

## 2018-02-05 DIAGNOSIS — M159 Polyosteoarthritis, unspecified: Secondary | ICD-10-CM

## 2018-02-20 DIAGNOSIS — M47816 Spondylosis without myelopathy or radiculopathy, lumbar region: Secondary | ICD-10-CM | POA: Diagnosis not present

## 2018-02-20 DIAGNOSIS — G894 Chronic pain syndrome: Secondary | ICD-10-CM | POA: Diagnosis not present

## 2018-02-20 DIAGNOSIS — M25569 Pain in unspecified knee: Secondary | ICD-10-CM | POA: Diagnosis not present

## 2018-02-26 DIAGNOSIS — M9901 Segmental and somatic dysfunction of cervical region: Secondary | ICD-10-CM | POA: Diagnosis not present

## 2018-02-26 DIAGNOSIS — M461 Sacroiliitis, not elsewhere classified: Secondary | ICD-10-CM | POA: Diagnosis not present

## 2018-02-26 DIAGNOSIS — M9905 Segmental and somatic dysfunction of pelvic region: Secondary | ICD-10-CM | POA: Diagnosis not present

## 2018-02-26 DIAGNOSIS — M5116 Intervertebral disc disorders with radiculopathy, lumbar region: Secondary | ICD-10-CM | POA: Diagnosis not present

## 2018-02-26 DIAGNOSIS — M542 Cervicalgia: Secondary | ICD-10-CM | POA: Diagnosis not present

## 2018-02-26 DIAGNOSIS — M9904 Segmental and somatic dysfunction of sacral region: Secondary | ICD-10-CM | POA: Diagnosis not present

## 2018-03-14 ENCOUNTER — Encounter (HOSPITAL_COMMUNITY): Payer: Self-pay

## 2018-03-14 ENCOUNTER — Inpatient Hospital Stay (HOSPITAL_COMMUNITY)
Admission: EM | Admit: 2018-03-14 | Discharge: 2018-03-17 | DRG: 603 | Disposition: A | Discharge status: Home / Self Care | Payer: Medicare Other | Attending: Internal Medicine | Admitting: Internal Medicine

## 2018-03-14 DIAGNOSIS — Z7984 Long term (current) use of oral hypoglycemic drugs: Secondary | ICD-10-CM

## 2018-03-14 DIAGNOSIS — S90819A Abrasion, unspecified foot, initial encounter: Secondary | ICD-10-CM

## 2018-03-14 DIAGNOSIS — L0390 Cellulitis, unspecified: Secondary | ICD-10-CM

## 2018-03-14 DIAGNOSIS — G4733 Obstructive sleep apnea (adult) (pediatric): Secondary | ICD-10-CM | POA: Diagnosis present

## 2018-03-14 DIAGNOSIS — L03115 Cellulitis of right lower limb: Secondary | ICD-10-CM | POA: Diagnosis not present

## 2018-03-14 DIAGNOSIS — I959 Hypotension, unspecified: Secondary | ICD-10-CM | POA: Diagnosis not present

## 2018-03-14 DIAGNOSIS — R51 Headache: Secondary | ICD-10-CM | POA: Diagnosis not present

## 2018-03-14 DIAGNOSIS — Z791 Long term (current) use of non-steroidal anti-inflammatories (NSAID): Secondary | ICD-10-CM

## 2018-03-14 DIAGNOSIS — E559 Vitamin D deficiency, unspecified: Secondary | ICD-10-CM | POA: Diagnosis present

## 2018-03-14 DIAGNOSIS — I10 Essential (primary) hypertension: Secondary | ICD-10-CM | POA: Diagnosis present

## 2018-03-14 DIAGNOSIS — M542 Cervicalgia: Secondary | ICD-10-CM | POA: Diagnosis not present

## 2018-03-14 DIAGNOSIS — Z87891 Personal history of nicotine dependence: Secondary | ICD-10-CM

## 2018-03-14 DIAGNOSIS — Z96652 Presence of left artificial knee joint: Secondary | ICD-10-CM | POA: Diagnosis present

## 2018-03-14 DIAGNOSIS — D61818 Other pancytopenia: Secondary | ICD-10-CM

## 2018-03-14 DIAGNOSIS — D649 Anemia, unspecified: Secondary | ICD-10-CM | POA: Diagnosis present

## 2018-03-14 DIAGNOSIS — E118 Type 2 diabetes mellitus with unspecified complications: Secondary | ICD-10-CM | POA: Diagnosis present

## 2018-03-14 DIAGNOSIS — R110 Nausea: Secondary | ICD-10-CM | POA: Diagnosis not present

## 2018-03-14 DIAGNOSIS — R509 Fever, unspecified: Secondary | ICD-10-CM | POA: Diagnosis not present

## 2018-03-14 DIAGNOSIS — E11628 Type 2 diabetes mellitus with other skin complications: Secondary | ICD-10-CM | POA: Diagnosis present

## 2018-03-14 DIAGNOSIS — Z8349 Family history of other endocrine, nutritional and metabolic diseases: Secondary | ICD-10-CM

## 2018-03-14 DIAGNOSIS — J301 Allergic rhinitis due to pollen: Secondary | ICD-10-CM | POA: Diagnosis present

## 2018-03-14 DIAGNOSIS — M19042 Primary osteoarthritis, left hand: Secondary | ICD-10-CM | POA: Diagnosis present

## 2018-03-14 DIAGNOSIS — K219 Gastro-esophageal reflux disease without esophagitis: Secondary | ICD-10-CM | POA: Diagnosis present

## 2018-03-14 DIAGNOSIS — R0902 Hypoxemia: Secondary | ICD-10-CM | POA: Diagnosis not present

## 2018-03-14 DIAGNOSIS — R519 Headache, unspecified: Secondary | ICD-10-CM | POA: Diagnosis present

## 2018-03-14 DIAGNOSIS — Z6841 Body Mass Index (BMI) 40.0 and over, adult: Secondary | ICD-10-CM

## 2018-03-14 DIAGNOSIS — L089 Local infection of the skin and subcutaneous tissue, unspecified: Secondary | ICD-10-CM

## 2018-03-14 DIAGNOSIS — E1140 Type 2 diabetes mellitus with diabetic neuropathy, unspecified: Secondary | ICD-10-CM | POA: Diagnosis present

## 2018-03-14 MED ORDER — SODIUM CHLORIDE 0.9 % IV BOLUS (SEPSIS)
1000.0000 mL | Freq: Once | INTRAVENOUS | Status: AC
  Administered 2018-03-15: 1000 mL via INTRAVENOUS

## 2018-03-14 MED ORDER — ACETAMINOPHEN 325 MG PO TABS: 650.0000 mg | ORAL_TABLET | Freq: Once | ORAL | Status: DC

## 2018-03-14 NOTE — ED Triage Notes (Signed)
Pt arrived by EMS from home. Pt reports that he started having a fever and nausea around 0300 this morning. Pt reports the highest his temperature got was 104. Pt has complaint of back pain. EMS administered 250cc of normal saline and 4mg  of Zofran in route. Pt alert and oriented. Vitals stable on arrival.

## 2018-03-14 NOTE — ED Notes (Signed)
Bed: [id number] Expected date:  Expected time:  Means of arrival:  Comments: [age] M fever/ weakness/ nausea

## 2018-03-15 ENCOUNTER — Other Ambulatory Visit: Payer: Self-pay

## 2018-03-15 ENCOUNTER — Observation Stay (HOSPITAL_COMMUNITY): Payer: Medicare Other

## 2018-03-15 ENCOUNTER — Encounter (HOSPITAL_COMMUNITY): Payer: Self-pay | Admitting: Internal Medicine

## 2018-03-15 ENCOUNTER — Emergency Department (HOSPITAL_COMMUNITY): Payer: Medicare Other

## 2018-03-15 DIAGNOSIS — D61818 Other pancytopenia: Secondary | ICD-10-CM

## 2018-03-15 DIAGNOSIS — R509 Fever, unspecified: Secondary | ICD-10-CM | POA: Diagnosis not present

## 2018-03-15 DIAGNOSIS — E118 Type 2 diabetes mellitus with unspecified complications: Secondary | ICD-10-CM

## 2018-03-15 DIAGNOSIS — S90811A Abrasion, right foot, initial encounter: Secondary | ICD-10-CM | POA: Diagnosis not present

## 2018-03-15 DIAGNOSIS — R519 Headache, unspecified: Secondary | ICD-10-CM | POA: Diagnosis present

## 2018-03-15 DIAGNOSIS — R51 Headache: Secondary | ICD-10-CM

## 2018-03-15 DIAGNOSIS — L03115 Cellulitis of right lower limb: Secondary | ICD-10-CM | POA: Diagnosis not present

## 2018-03-15 LAB — URINALYSIS, ROUTINE W REFLEX MICROSCOPIC
Bilirubin Urine: NEGATIVE
Glucose, UA: NEGATIVE mg/dL
Hgb urine dipstick: NEGATIVE
Ketones, ur: NEGATIVE mg/dL
LEUKOCYTES UA: NEGATIVE
NITRITE: NEGATIVE
PROTEIN: NEGATIVE mg/dL
SPECIFIC GRAVITY, URINE: 1.021 (ref 1.005–1.030)
pH: 5 (ref 5.0–8.0)

## 2018-03-15 LAB — CBC
HCT: 41.7 % (ref 39.0–52.0)
Hemoglobin: 14.5 g/dL (ref 13.0–17.0)
MCH: 31.7 pg (ref 26.0–34.0)
MCHC: 34.8 g/dL (ref 30.0–36.0)
MCV: 91.2 fL (ref 78.0–100.0)
PLATELETS: 146 10*3/uL — AB (ref 150–400)
RBC: 4.57 MIL/uL (ref 4.22–5.81)
RDW: 13.4 % (ref 11.5–15.5)
WBC: 10.4 10*3/uL (ref 4.0–10.5)

## 2018-03-15 LAB — COMPREHENSIVE METABOLIC PANEL
ALBUMIN: 4 g/dL (ref 3.5–5.0)
ALBUMIN: 4.3 g/dL (ref 3.5–5.0)
ALK PHOS: 50 U/L (ref 38–126)
ALT: 25 U/L (ref 0–44)
ALT: 28 U/L (ref 0–44)
ANION GAP: 15 (ref 5–15)
AST: 26 U/L (ref 15–41)
AST: 30 U/L (ref 15–41)
Alkaline Phosphatase: 53 U/L (ref 38–126)
Anion gap: 12 (ref 5–15)
BUN: 21 mg/dL (ref 8–23)
BUN: 22 mg/dL (ref 8–23)
CALCIUM: 9.1 mg/dL (ref 8.9–10.3)
CALCIUM: 9.2 mg/dL (ref 8.9–10.3)
CO2: 21 mmol/L — AB (ref 22–32)
CO2: 22 mmol/L (ref 22–32)
CREATININE: 1.08 mg/dL (ref 0.61–1.24)
Chloride: 102 mmol/L (ref 98–111)
Chloride: 104 mmol/L (ref 98–111)
Creatinine, Ser: 1.09 mg/dL (ref 0.61–1.24)
GFR calc non Af Amer: >60 mL/min (ref >60)
GFR calc non Af Amer: >60 mL/min (ref >60)
Glucose, Bld: 141 mg/dL — ABNORMAL HIGH (ref 70–99)
Glucose, Bld: 176 mg/dL — ABNORMAL HIGH (ref 70–99)
POTASSIUM: 4.7 mmol/L (ref 3.5–5.1)
Potassium: 4.1 mmol/L (ref 3.5–5.1)
SODIUM: 138 mmol/L (ref 135–145)
SODIUM: 138 mmol/L (ref 135–145)
TOTAL PROTEIN: 6.9 g/dL (ref 6.5–8.1)
Total Bilirubin: 1 mg/dL (ref 0.3–1.2)
Total Bilirubin: 1 mg/dL (ref 0.3–1.2)
Total Protein: 7.6 g/dL (ref 6.5–8.1)

## 2018-03-15 LAB — GLUCOSE, CAPILLARY
GLUCOSE-CAPILLARY: 142 mg/dL — AB (ref 70–99)
GLUCOSE-CAPILLARY: 161 mg/dL — AB (ref 70–99)
Glucose-Capillary: 141 mg/dL — ABNORMAL HIGH (ref 70–99)
Glucose-Capillary: 102 mg/dL — ABNORMAL HIGH (ref 70–99)
Glucose-Capillary: 133 mg/dL — ABNORMAL HIGH (ref 70–99)

## 2018-03-15 LAB — CBC WITH DIFFERENTIAL/PLATELET
BASOS ABS: 0 10*3/uL (ref 0.0–0.1)
BASOS PCT: 0 %
EOS ABS: 0 10*3/uL (ref 0.0–0.7)
Eosinophils Relative: 0 %
HCT: 34.9 % — ABNORMAL LOW (ref 39.0–52.0)
Hemoglobin: 12.3 g/dL — ABNORMAL LOW (ref 13.0–17.0)
LYMPHS ABS: 0.3 10*3/uL — AB (ref 0.7–4.0)
Lymphocytes Relative: 4 %
MCH: 31.8 pg (ref 26.0–34.0)
MCHC: 35.2 g/dL (ref 30.0–36.0)
MCV: 90.2 fL (ref 78.0–100.0)
Monocytes Absolute: 0.9 10*3/uL (ref 0.1–1.0)
Monocytes Relative: 9 %
Neutro Abs: 8 10*3/uL — ABNORMAL HIGH (ref 1.7–7.7)
Neutrophils Relative %: 87 %
PLATELETS: 150 10*3/uL (ref 150–400)
RBC: 3.87 MIL/uL — AB (ref 4.22–5.81)
RDW: 13.1 % (ref 11.5–15.5)
WBC: 9.3 10*3/uL (ref 4.0–10.5)

## 2018-03-15 LAB — I-STAT CG4 LACTIC ACID, ED: Lactic Acid, Venous: 1.89 mmol/L (ref 0.5–1.9)

## 2018-03-15 LAB — SEDIMENTATION RATE: Sed Rate: 15 mm/hr (ref 0–16)

## 2018-03-15 LAB — HIV ANTIBODY (ROUTINE TESTING W REFLEX): HIV SCREEN 4TH GENERATION: NONREACTIVE

## 2018-03-15 MED ORDER — AMLODIPINE BESYLATE 5 MG PO TABS
5.0000 mg | ORAL_TABLET | Freq: Every day | ORAL | Status: DC
  Administered 2018-03-15: 5 mg via ORAL
  Filled 2018-03-15: qty 1

## 2018-03-15 MED ORDER — VANCOMYCIN HCL 10 G IV SOLR
1500.0000 mg | Freq: Once | INTRAVENOUS | Status: AC
  Administered 2018-03-15: 1500 mg via INTRAVENOUS
  Filled 2018-03-15: qty 1500

## 2018-03-15 MED ORDER — HYDROCHLOROTHIAZIDE 12.5 MG PO CAPS
12.5000 mg | ORAL_CAPSULE | Freq: Every day | ORAL | Status: DC
  Administered 2018-03-15: 12.5 mg via ORAL
  Filled 2018-03-15: qty 1

## 2018-03-15 MED ORDER — NAPHAZOLINE-PHENIRAMINE 0.025-0.3 % OP SOLN
1.0000 [drp] | Freq: Four times a day (QID) | OPHTHALMIC | Status: DC | PRN
  Filled 2018-03-15: qty 15

## 2018-03-15 MED ORDER — INSULIN ASPART 100 UNIT/ML ~~LOC~~ SOLN
0.0000 [IU] | Freq: Three times a day (TID) | SUBCUTANEOUS | Status: DC
  Administered 2018-03-15: 1 [IU] via SUBCUTANEOUS
  Administered 2018-03-15: 2 [IU] via SUBCUTANEOUS

## 2018-03-15 MED ORDER — VANCOMYCIN HCL IN DEXTROSE 1-5 GM/200ML-% IV SOLN
1000.0000 mg | Freq: Once | INTRAVENOUS | Status: AC
  Administered 2018-03-15: 1000 mg via INTRAVENOUS
  Filled 2018-03-15: qty 200

## 2018-03-15 MED ORDER — MELOXICAM 15 MG PO TABS
15.0000 mg | ORAL_TABLET | Freq: Every day | ORAL | Status: DC | PRN
  Administered 2018-03-15: 15 mg via ORAL
  Filled 2018-03-15 (×3): qty 1

## 2018-03-15 MED ORDER — ACETAMINOPHEN 650 MG RE SUPP: 650.0000 mg | Freq: Four times a day (QID) | RECTAL | Status: DC | PRN

## 2018-03-15 MED ORDER — SODIUM CHLORIDE 0.9 % IV SOLN
2.0000 g | Freq: Once | INTRAVENOUS | Status: AC
  Administered 2018-03-15: 2 g via INTRAVENOUS
  Filled 2018-03-15: qty 20

## 2018-03-15 MED ORDER — INSULIN ASPART 100 UNIT/ML ~~LOC~~ SOLN: 0.0000 [IU] | Freq: Every day | SUBCUTANEOUS | Status: DC

## 2018-03-15 MED ORDER — SODIUM CHLORIDE 0.9 % IV SOLN
2.0000 g | INTRAVENOUS | Status: DC
  Administered 2018-03-15: 2 g via INTRAVENOUS
  Filled 2018-03-15: qty 2
  Filled 2018-03-15 (×2): qty 2000

## 2018-03-15 MED ORDER — CYCLOBENZAPRINE HCL 10 MG PO TABS
10.0000 mg | ORAL_TABLET | Freq: Three times a day (TID) | ORAL | Status: DC | PRN
  Administered 2018-03-15 (×2): 10 mg via ORAL
  Filled 2018-03-15 (×2): qty 1

## 2018-03-15 MED ORDER — BUTALBITAL-APAP-CAFFEINE 50-325-40 MG PO TABS
1.0000 | ORAL_TABLET | Freq: Four times a day (QID) | ORAL | Status: DC | PRN
  Administered 2018-03-15 – 2018-03-16 (×3): 1 via ORAL
  Filled 2018-03-15 (×3): qty 1

## 2018-03-15 MED ORDER — VANCOMYCIN HCL 10 G IV SOLR: 1250.0000 mg | Freq: Two times a day (BID) | INTRAVENOUS | Status: DC

## 2018-03-15 MED ORDER — ALBUTEROL SULFATE HFA 108 (90 BASE) MCG/ACT IN AERS: 2.0000 | INHALATION_SPRAY | Freq: Four times a day (QID) | RESPIRATORY_TRACT | Status: DC | PRN

## 2018-03-15 MED ORDER — ALBUTEROL SULFATE (2.5 MG/3ML) 0.083% IN NEBU: 2.5000 mg | INHALATION_SOLUTION | Freq: Four times a day (QID) | RESPIRATORY_TRACT | Status: DC | PRN

## 2018-03-15 MED ORDER — VITAMIN D 1000 UNITS PO TABS
5000.0000 [IU] | ORAL_TABLET | Freq: Every day | ORAL | Status: DC
  Administered 2018-03-15 – 2018-03-17 (×3): 5000 [IU] via ORAL
  Filled 2018-03-15 (×3): qty 5

## 2018-03-15 MED ORDER — METFORMIN HCL 500 MG PO TABS: 1000.0000 mg | ORAL_TABLET | Freq: Two times a day (BID) | ORAL | Status: DC

## 2018-03-15 MED ORDER — TRAMADOL HCL 50 MG PO TABS
100.0000 mg | ORAL_TABLET | Freq: Three times a day (TID) | ORAL | Status: DC
  Administered 2018-03-15 (×2): 100 mg via ORAL
  Filled 2018-03-15 (×2): qty 2

## 2018-03-15 MED ORDER — SODIUM CHLORIDE 0.9 % IV SOLN
INTRAVENOUS | Status: AC
  Administered 2018-03-15: 04:00:00 via INTRAVENOUS

## 2018-03-15 MED ORDER — SODIUM CHLORIDE 0.9 % IV SOLN
2.0000 g | Freq: Two times a day (BID) | INTRAVENOUS | Status: DC
  Administered 2018-03-15 (×2): 2 g via INTRAVENOUS
  Filled 2018-03-15: qty 2
  Filled 2018-03-15: qty 20
  Filled 2018-03-15: qty 2

## 2018-03-15 MED ORDER — IBUPROFEN 200 MG PO TABS
400.0000 mg | ORAL_TABLET | Freq: Once | ORAL | Status: AC
  Administered 2018-03-15: 400 mg via ORAL
  Filled 2018-03-15: qty 2

## 2018-03-15 MED ORDER — ACETAMINOPHEN 325 MG PO TABS
650.0000 mg | ORAL_TABLET | Freq: Four times a day (QID) | ORAL | Status: DC | PRN
  Administered 2018-03-15 – 2018-03-16 (×4): 650 mg via ORAL
  Filled 2018-03-15 (×4): qty 2

## 2018-03-15 MED ORDER — DOXYCYCLINE HYCLATE 100 MG PO TABS
100.0000 mg | ORAL_TABLET | Freq: Two times a day (BID) | ORAL | Status: DC
  Administered 2018-03-15 – 2018-03-17 (×6): 100 mg via ORAL
  Filled 2018-03-15 (×6): qty 1

## 2018-03-15 MED ORDER — IRBESARTAN 150 MG PO TABS
150.0000 mg | ORAL_TABLET | Freq: Every day | ORAL | Status: DC
  Administered 2018-03-15 – 2018-03-16 (×2): 150 mg via ORAL
  Filled 2018-03-15 (×2): qty 1

## 2018-03-15 MED ORDER — VITAMIN D (ERGOCALCIFEROL) 1.25 MG (50000 UNIT) PO CAPS: 50000.0000 [IU] | ORAL_CAPSULE | ORAL | Status: DC

## 2018-03-15 MED ORDER — HYDROCODONE-ACETAMINOPHEN 5-325 MG PO TABS
1.0000 | ORAL_TABLET | ORAL | Status: DC | PRN
  Administered 2018-03-15 – 2018-03-17 (×4): 1 via ORAL
  Filled 2018-03-15 (×4): qty 1

## 2018-03-15 MED ORDER — TRAMADOL HCL 50 MG PO TABS
50.0000 mg | ORAL_TABLET | Freq: Once | ORAL | Status: AC
  Administered 2018-03-15: 50 mg via ORAL
  Filled 2018-03-15: qty 1

## 2018-03-15 NOTE — Progress Notes (Signed)
Pharmacy Antibiotic Note  Robert [name] is a 62 y.o. male admitted on 03/14/2018 with meningitis.  Pharmacy has been consulted for Vancomycin dosing.  Plan: Vancomycin 1gm x1 and 1.5gm x1 (2.5gm total load), then 1250mg  iv q12hr  Goal AUC = 400 - 500 for all indications, except meningitis (goal AUC > 500 and Cmin 15-20 mcg/mL)   Height: 6\' 1"  (185.4 cm) Weight: (!) 351 lb 6.6 oz (159.4 kg) IBW/kg (Calculated) : 79.9  Temp (24hrs), Avg:99.8 F (37.7 C), Min:98.4 F (36.9 C), Max:102.1 F (38.9 C)  Recent Labs  Lab 03/15/18 0012 03/15/18 0015 03/15/18 0452  WBC 9.3  --  10.4  CREATININE 1.08  --  1.09  LATICACIDVEN  --  1.89  --     Estimated Creatinine Clearance: 108.2 mL/min (by C-G formula based on SCr of 1.09 mg/dL).    No Known Allergies  Antimicrobials this admission: Vancomycin 03/15/2018 >>  Dose adjustments this admission: -  Microbiology results: -  Thank you for allowing pharmacy to be a part of this patient's care.  Aleene DavidsonGrimsley Jr, Novalie Leamy Crowford 03/15/2018 5:57 AM

## 2018-03-15 NOTE — Progress Notes (Signed)
[facility] PLAN OF CARE NOTE Patient: Robert [name] EAV:[id number]   PCP: Thomasene Lotpalski, Deborah, DO DOB: 11-Jul-1956   DOA: 03/14/2018   DOS: 03/15/2018    Patient was admitted by my colleague Dr. Selena BattenKIm earlier on 03/15/2018. I have reviewed the H&P as well as assessment and plan and agree with the same. Important changes in the plan are listed below.  Plan of care: Principal Problem:   Fever Active Problems:   Type 2 diabetes mellitus with complication, without long-term current use of insulin (HCC)   Headache   Anemia fever due to cellulitis No concern for meningitis Continue ceftriaxone and doxycycline  Stop amoxicillin and vancomycin  No need for LP for now Follow up on cultures X ray foot shows swelling no evidence of osteomyelitis  CT head unremarkable  Author: Lynden OxfordPranav Newton Frutiger, MD [facility] Pager: [phone] 03/15/2018 11:29 AM   If 7PM-7AM, please contact night-coverage at [email or web address], password [email or web address]

## 2018-03-15 NOTE — H&P (Signed)
TRH H&P   Patient Demographics:    Robert [name], is a 62 y.o. male  MRN: [id number]   DOB - 04/27/1956  Admit Date - 03/14/2018  Outpatient Primary MD for the patient is [name], DO  Referring MD/NP/PA:  Ripley Fraise  Outpatient Specialists:     Patient coming from:   home  Chief Complaint  Patient presents with  . Fever  . Nausea      HPI:    Robert [name]  is a 62 y.o. male, w Jerrye Bushy, Barretts Esophagus, OSA (on cpap), apparently presents with headache starting 7am. Frontal with slight photophobia, and nausea and neck stiffness. Pt denies tick bite.  + fever for the past 24 hours,  T 104 at home. Pt concerned about fever and therefore presented to ED,   In Ed, T101 CXR IMPRESSION: No active disease.  Na 138, k 4.1, Bun 21, Creatinine 1.08 Ast 26, Alt 25 Alk phos 50, T. Bili 1.0 Wbc 9.3, Hgb 12.3, Plt 150  Lactic acid 1.89  ED states that they are unable to attempt LP due to his body habitus  Pt will be admitted for headache r/o meningitis           Review of systems:    In addition to the HPI above,   No Headache, No changes with Vision or hearing, No problems swallowing food or Liquids, No Chest pain, Cough or Shortness of Breath, No Abdominal pain, No Nausea or Vommitting, Bowel movements are regular, No Blood in stool or Urine, No dysuria, No new skin rashes or bruises, No new joints pains-aches,  No new weakness, tingling, numbness in any extremity, No recent weight gain or loss, No polyuria, polydypsia or polyphagia, No significant Mental Stressors.  A full 10 point Review of Systems was done, except as stated above, all other Review of Systems were negative.   With Past History of the following :    Past Medical History:  Diagnosis Date  . Allergic rhinitis due to pollen   . Arthritis   . Asthma   . Back pain   . Barrett  esophagus   . Depressive disorder   . Exposure to hepatitis B   . Exposure to hepatitis C   . Narcotic abuse (HCC)    pain medications  . OSA (obstructive sleep apnea)    on CPAP       Past Surgical History:  Procedure Laterality Date  . ABDOMINAL SURGERY    . BACK SURGERY     Rods and screws in lumbar area  . COLONOSCOPY    . ESOPHAGOGASTRODUODENOSCOPY  multiple  . HERNIA REPAIR     4  . JOINT REPLACEMENT    . TOTAL KNEE ARTHROPLASTY  2009   left      Social History:     Social History   Tobacco Use  . Smoking status: Former  Smoker    Packs/day: 0.80    Years: 15.00    Pack years: 12.00    Types: Cigarettes    Last attempt to quit: 07/16/2004    Years since quitting: 13.6  . Smokeless tobacco: Never Used  Substance Use Topics  . Alcohol use: No     Lives - at home  Mobility - walks by self   Family History :     Family History  Problem Relation Age of Onset  . Allergies Mother   . Early death Father   . Thyroid disease Sister   . Colon cancer Neg Hx   . Stomach cancer Neg Hx        Home Medications:   Prior to Admission medications   Medication Sig Start Date End Date Taking? Authorizing Provider  B Complex Vitamins (B COMPLEX 100 PO) Take 1 capsule by mouth every 3 (three) days.    Yes [provider]  Cholecalciferol (VITAMIN D3) 5000 units TABS 5,000 IU OTC vitamin D3 daily. Patient taking differently: Take 5,000 Units by mouth daily.  05/08/16  Yes Opalski, Neoma Laming, DO  cyclobenzaprine (FLEXERIL) 10 MG tablet TAKE 1 TABLET BY MOUTH TWICE A DAY Patient taking differently: Take 10 mg by mouth 3 (three) times daily as needed for muscle spasms.  02/05/18  Yes Opalski, Neoma Laming, DO  ibuprofen (ADVIL,MOTRIN) 200 MG tablet Take 800 mg by mouth every 6 (six) hours as needed for fever, mild pain or moderate pain.    Yes [provider]  meloxicam (MOBIC) 15 MG tablet Take 1 tablet (15 mg total) by mouth daily. Patient taking  differently: Take 15 mg by mouth daily as needed for pain.  01/09/18  Yes Opalski, Neoma Laming, DO  metFORMIN (GLUCOPHAGE) 500 MG tablet Take 2 tablets (1,000 mg total) by mouth 2 (two) times daily with a meal. 11/18/17  Yes Opalski, Deborah, DO  naphazoline-pheniramine (NAPHCON-A) 0.025-0.3 % ophthalmic solution Place 1 drop into both eyes 4 (four) times daily as needed for eye irritation.   Yes [provider]  Olmesartan-amLODIPine-HCTZ 20-5-12.5 MG TABS 1 tab po qd Patient taking differently: Take 1 tablet by mouth daily.  01/09/18  Yes Opalski, Neoma Laming, DO  Potassium 75 MG TABS Take 150 mg by mouth every other day.    Yes [provider]  traMADol (ULTRAM) 50 MG tablet Take 100 mg by mouth 3 (three) times daily. For pain   Yes [provider]  vitamin C (ASCORBIC ACID) 500 MG tablet Take 500 mg by mouth daily.   Yes [provider]  Vitamin D, Ergocalciferol, (DRISDOL) 50000 units CAPS capsule TAKE 1 CAPSULE WEEKLY Patient taking differently: Take 50,000 Units by mouth every Friday.  08/05/17  Yes Opalski, Deborah, DO  VITAMIN E PO Take 1 tablet by mouth daily.   Yes [provider]  zinc gluconate 50 MG tablet Take 50 mg by mouth daily.   Yes [provider]  albuterol (PROVENTIL HFA;VENTOLIN HFA) 108 (90 Base) MCG/ACT inhaler Inhale 2 puffs into the lungs every 6 (six) hours as needed for wheezing or shortness of breath.    [provider]  APPLE CIDER VINEGAR PO Take by mouth.    [provider]  atorvastatin (LIPITOR) 20 MG tablet TAKE 1 TABLET BY MOUTH EVERYDAY AT BEDTIME Patient not taking: Reported on 03/14/2018 12/23/17   [name], DO  blood glucose meter kit and supplies KIT Dispense based on patient and insurance preference. Use up to four times daily as  directed. (FOR ICD-9 250.00, 250.01). 05/09/17   Opalski, Deborah, DO  blood glucose meter kit and supplies Dispense based on patient and insurance preference. Use  to check glucose fasting in the AM and then after largest meal of the day.. (FOR ICD-10 E10.9, E11.9). 12/04/17   Opalski, Neoma Laming, DO  Blood Glucose Monitoring Suppl (ONE TOUCH ULTRA MINI) w/Device KIT Use to test blood sugar, test in the morning fasting and test after largest meal of the day. 05/28/17   Opalski, Deborah, DO  glucose blood (ONE TOUCH ULTRA TEST) test strip Use to test blood sugar, test in the morning fasting and test after largest meal of the day. 05/28/17   [name], DO  ONETOUCH DELICA LANCETS 16X MISC Use for testing blood sugar, test once in the morning fasting and test after largest meal of the day. 05/28/17   Opalski, Deborah, DO  Triamcinolone Acetonide (TRIAMCINOLONE 0.1 % CREAM : EUCERIN) CREA Apply 1 application topically 2 (two) times daily. Patient not taking: Reported on 03/14/2018 01/14/18   [name], DO     Allergies:    No Known Allergies   Physical Exam:   Vitals  Blood pressure 103/66, pulse 74, temperature (!) 102.1 F (38.9 C), temperature source Rectal, resp. rate (!) 21, height 6' 1" (1.854 m), weight (!) 152.4 kg, SpO2 93 %.   1. General  lying in bed in NAD,    2. Normal affect and insight, Not Suicidal or Homicidal, Awake Alert, Oriented X 3.  3. No F.N deficits, ALL C.Nerves Intact, Strength 5/5 all 4 extremities, Sensation intact all 4 extremities, Plantars down going.  4. Ears and Eyes appear Normal, Conjunctivae clear, PERRLA. Moist Oral Mucosa.  5. Supple Neck, No JVD, No cervical lymphadenopathy appriciated, No Carotid Bruits.  6. Symmetrical Chest wall movement, Good air movement bilaterally, CTAB.  7. RRR, No Gallops, Rubs or Murmurs, No Parasternal Heave.  8. Positive Bowel Sounds, Abdomen Soft, No tenderness, No organomegaly appriciated,No rebound -guarding or rigidity.  9.  No Cyanosis, Normal Skin Turgor, No Skin Rash or Bruise.  10. Good muscle tone,  joints appear normal , no effusions, Normal ROM.  11. No  Palpable Lymph Nodes in Neck or Axillae   skin ulcer 52m on the tip of the 2nd digit right foot Slight redness of the 2nd toe  Negative kernig, neg brudzinski No photophobia   Data Review:    CBC Recent Labs  Lab 03/15/18 0012  WBC 9.3  HGB 12.3*  HCT 34.9*  PLT 150  MCV 90.2  MCH 31.8  MCHC 35.2  RDW 13.1  LYMPHSABS 0.3*  MONOABS 0.9  EOSABS 0.0  BASOSABS 0.0   ------------------------------------------------------------------------------------------------------------------  Chemistries  Recent Labs  Lab 03/15/18 0012  NA 138  K 4.1  CL 104  CO2 22  GLUCOSE 176*  BUN 21  CREATININE 1.08  CALCIUM 9.2  AST 26  ALT 25  ALKPHOS 50  BILITOT 1.0   ------------------------------------------------------------------------------------------------------------------ estimated creatinine clearance is 106.4 mL/min (by C-G formula based on SCr of 1.08 mg/dL). ------------------------------------------------------------------------------------------------------------------ No results for input(s): TSH, T4TOTAL, T3FREE, THYROIDAB in the last 72 hours.  Invalid input(s): FREET3  Coagulation profile No results for input(s): INR, PROTIME in the last 168 hours. ------------------------------------------------------------------------------------------------------------------- No results for input(s): DDIMER in the last 72 hours. -------------------------------------------------------------------------------------------------------------------  Cardiac Enzymes No results for input(s): CKMB, TROPONINI, MYOGLOBIN in the last 168 hours.  Invalid input(s): CK ------------------------------------------------------------------------------------------------------------------ No results found for: BNP   ---------------------------------------------------------------------------------------------------------------  Urinalysis  Component Value Date/Time   COLORURINE YELLOW  05/31/2012 1658   APPEARANCEUR CLEAR 05/31/2012 1658   LABSPEC 1.026 05/31/2012 1658   PHURINE 8.0 05/31/2012 1658   GLUCOSEU NEGATIVE 05/31/2012 1658   HGBUR NEGATIVE 05/31/2012 [place] 05/31/2012 1658   KETONESUR 40 (A) 05/31/2012 1658   PROTEINUR NEGATIVE 05/31/2012 1658   UROBILINOGEN 0.2 05/31/2012 1658   NITRITE NEGATIVE 05/31/2012 1658   LEUKOCYTESUR NEGATIVE 05/31/2012 1658    ----------------------------------------------------------------------------------------------------------------   Imaging Results:    Dg Chest Port 1 View  Result Date: 03/15/2018 CLINICAL DATA:  Fever nausea beginning this morning. History of asthma. EXAM: PORTABLE CHEST 1 VIEW COMPARISON:  Chest radiograph September 27, 2015 FINDINGS: Cardiomediastinal silhouette is normal. No pleural effusions or focal consolidations. Trachea projects midline and there is no pneumothorax. Soft tissue planes and included osseous structures are non-suspicious. Old LEFT clavicle fracture. IMPRESSION: No active disease. Electronically Signed   By: Elon Alas M.D.   On: 03/15/2018 00:48       Assessment & Plan:    Principal Problem:   Fever Active Problems:   Type 2 diabetes mellitus with complication, without long-term current use of insulin (HCC)   Headache   Anemia    Fever Blood culture x2 LP pending Doxycycline 119m po bid while awaiting RMSF titer to return  ? Meningitis LP attempt pending by ED vanco iv,  Rocephin iv Ampicillin iv  Await LP  Dm2 Cont metformin Fsbs ac and qhs, ISS  Hypertension Cont Olmesartan-Amlodipine-hydrochlorothiazide  OSA CPAP at nite  DVT Prophylaxis - SCDs   AM Labs Ordered, also please review Full Orders  Family Communication: Admission, patients condition and plan of care including tests being ordered have been discussed with the patient  who indicate understanding and agree with the plan and Code Status.  Code Status  FULL  CODE  Likely DC to  home  Condition GUARDED    Consults called: none  Admission status: observation , pt will require hospitalization while awaiting LP results and results of fever w/up.  Depending on the source of fever , might need inpatient rather than observation stay.   Time spent in minutes : 70   JJani GravelM.D on 03/15/2018 at 2:02 AM  Between 7am to 7pm - Pager - [phone] . After 7pm go to [email or web address] - password [email or web address] - Office  [phone]

## 2018-03-15 NOTE — ED Provider Notes (Signed)
[facility] DEPT Provider Note   CSN: [id number] Arrival date & time: 03/14/18  2242     History   Chief Complaint Chief Complaint  Patient presents with  . Fever  . Nausea    HPI Robert [name] is a 62 y.o. male.  The history is provided by the patient and the spouse.  Fever   This is a new problem. The current episode started 12 to 24 hours ago. The problem occurs constantly. The problem has been gradually worsening. The maximum temperature noted was 103 to 104 F. Associated symptoms include headaches and muscle aches. Pertinent negatives include no chest pain, no diarrhea, no vomiting, no sore throat and no cough. He has tried ibuprofen and acetaminophen for the symptoms.  Patient with history of obesity, depression, chronic back pain presents with fever.  He reports fever and myalgias.  He also reports headache and neck pain. No new weakness.  No incontinence.  No chest or abdominal pain.  No cough. No tick bites No foreign travel.  No rash.  No sick contacts.  [place], but no other known sick contact Past Medical History:  Diagnosis Date  . Allergic rhinitis due to pollen   . Arthritis   . Asthma   . Back pain   . Barrett esophagus   . Depressive disorder   . Exposure to hepatitis B   . Exposure to hepatitis C   . Narcotic abuse (HCC)    pain medications  . OSA (obstructive sleep apnea)    on CPAP     Patient Active Problem List   Diagnosis Date Noted  . Inactivity 01/09/2018  . Generalized osteoarthritis 01/09/2018  . Contact dermatitis and eczema 01/09/2018  . Type 2 diabetes mellitus with complication, without long-term current use of insulin ([place]) 09/30/2017  . Hypertension associated with diabetes ([place]) 09/30/2017  . Mixed diabetic hyperlipidemia associated with type 2 diabetes mellitus ([place]) 09/30/2017  . Arthritis of carpometacarpal [facility]) joint of left thumb 07/02/2017  . Bilateral leg  cramps 05/09/2017  . acute Left heel pain 03/28/2017  . Tick bite 02/11/2017  . Barrett's esophagus 12/07/2016  . Chronic pain of left heel 11/27/2016  . Chronic venous stasis dermatitis of both lower extremities 05/08/2016  . Vitamin D deficiency 05/08/2016  . OSA on CPAP- seen Dr Melvyn Novas in past 04/11/2016  . Bilateral lower extremity pitting edema 04/11/2016  . Physical deconditioning 04/11/2016  . Restrictive lung disease secondary to obesity 04/11/2016  . History of tobacco use 04/11/2016  . h/o Depressive disorder 04/11/2016  . Lumbar radiculopathy 07/21/2012  . Chronic pain due to injury 07/21/2012  . Morbid obesity BMI >er 48 02/15/2012  . Back pain 01/04/2012  . Postlaminectomy syndrome, lumbar region 01/04/2012  . h/o Narcotic abuse 01/04/2012  . Dyspnea 11/27/2011  . Confusion 09/22/2011  . Abdominal pain 09/22/2011  . Constipation 09/22/2011  . Chronic back pain 09/22/2011  . Dehydration 09/22/2011  . Nausea & vomiting 09/22/2011    Past Surgical History:  Procedure Laterality Date  . ABDOMINAL SURGERY    . BACK SURGERY     Rods and screws in lumbar area  . COLONOSCOPY    . ESOPHAGOGASTRODUODENOSCOPY  multiple  . HERNIA REPAIR     4  . JOINT REPLACEMENT    . TOTAL KNEE ARTHROPLASTY  2009   left        Home Medications    Prior to Admission medications  Medication Sig Start Date End Date Taking? Authorizing Provider  B Complex Vitamins (B COMPLEX 100 PO) Take 1 capsule by mouth every 3 (three) days.    Yes [provider]  Cholecalciferol (VITAMIN D3) 5000 units TABS 5,000 IU OTC vitamin D3 daily. Patient taking differently: Take 5,000 Units by mouth daily.  05/08/16  Yes Opalski, Neoma Laming, DO  cyclobenzaprine (FLEXERIL) 10 MG tablet TAKE 1 TABLET BY MOUTH TWICE A DAY Patient taking differently: Take 10 mg by mouth 3 (three) times daily as needed for muscle spasms.  02/05/18  Yes Opalski, Neoma Laming, DO  ibuprofen (ADVIL,MOTRIN) 200 MG tablet Take  800 mg by mouth every 6 (six) hours as needed for fever, mild pain or moderate pain.    Yes [provider]  meloxicam (MOBIC) 15 MG tablet Take 1 tablet (15 mg total) by mouth daily. Patient taking differently: Take 15 mg by mouth daily as needed for pain.  01/09/18  Yes Opalski, Neoma Laming, DO  metFORMIN (GLUCOPHAGE) 500 MG tablet Take 2 tablets (1,000 mg total) by mouth 2 (two) times daily with a meal. 11/18/17  Yes Opalski, Deborah, DO  naphazoline-pheniramine (NAPHCON-A) 0.025-0.3 % ophthalmic solution Place 1 drop into both eyes 4 (four) times daily as needed for eye irritation.   Yes [provider]  Olmesartan-amLODIPine-HCTZ 20-5-12.5 MG TABS 1 tab po qd Patient taking differently: Take 1 tablet by mouth daily.  01/09/18  Yes Opalski, Neoma Laming, DO  Potassium 75 MG TABS Take 150 mg by mouth every other day.    Yes [provider]  traMADol (ULTRAM) 50 MG tablet Take 100 mg by mouth 3 (three) times daily. For pain   Yes [provider]  vitamin C (ASCORBIC ACID) 500 MG tablet Take 500 mg by mouth daily.   Yes [provider]  Vitamin D, Ergocalciferol, (DRISDOL) 50000 units CAPS capsule TAKE 1 CAPSULE WEEKLY Patient taking differently: Take 50,000 Units by mouth every Friday.  08/05/17  Yes Opalski, Deborah, DO  VITAMIN E PO Take 1 tablet by mouth daily.   Yes [provider]  zinc gluconate 50 MG tablet Take 50 mg by mouth daily.   Yes [provider]  albuterol (PROVENTIL HFA;VENTOLIN HFA) 108 (90 Base) MCG/ACT inhaler Inhale 2 puffs into the lungs every 6 (six) hours as needed for wheezing or shortness of breath.    [provider]  APPLE CIDER VINEGAR PO Take by mouth.    [provider]  atorvastatin (LIPITOR) 20 MG tablet TAKE 1 TABLET BY MOUTH EVERYDAY AT BEDTIME Patient not taking: Reported on 03/14/2018 12/23/17   [name], DO  blood glucose meter kit and supplies KIT Dispense based on patient and  insurance preference. Use up to four times daily as directed. (FOR ICD-9 250.00, 250.01). 05/09/17   Opalski, Deborah, DO  blood glucose meter kit and supplies Dispense based on patient and insurance preference. Use to check glucose fasting in the AM and then after largest meal of the day.. (FOR ICD-10 E10.9, E11.9). 12/04/17   Opalski, Neoma Laming, DO  Blood Glucose Monitoring Suppl (ONE TOUCH ULTRA MINI) w/Device KIT Use to test blood sugar, test in the morning fasting and test after largest meal of the day. 05/28/17   Opalski, Deborah, DO  glucose blood (ONE TOUCH ULTRA TEST) test strip Use to test blood sugar, test in the morning fasting and test after largest meal of the day. 05/28/17   [name], DO  ONETOUCH DELICA LANCETS 90W MISC Use for testing blood  sugar, test once in the morning fasting and test after largest meal of the day. 05/28/17   Opalski, Deborah, DO  Triamcinolone Acetonide (TRIAMCINOLONE 0.1 % CREAM : EUCERIN) CREA Apply 1 application topically 2 (two) times daily. Patient not taking: Reported on 03/14/2018 01/14/18   [name], DO    Family History Family History  Problem Relation Age of Onset  . Allergies Mother   . Early death Father   . Thyroid disease Sister   . Colon cancer Neg Hx   . Stomach cancer Neg Hx     Social History Social History   Tobacco Use  . Smoking status: Former Smoker    Packs/day: 0.80    Years: 15.00    Pack years: 12.00    Types: Cigarettes    Last attempt to quit: 07/16/2004    Years since quitting: 13.6  . Smokeless tobacco: Never Used  Substance Use Topics  . Alcohol use: No  . Drug use: No    Comment: Prior abuse of narcotics     Allergies   Patient has no known allergies.   Review of Systems Review of Systems  Constitutional: Positive for fever.  HENT: Negative for sore throat.   Respiratory: Negative for cough.   Cardiovascular: Negative for chest pain.  Gastrointestinal: Negative for diarrhea and vomiting.    Genitourinary: Negative for dysuria.  Musculoskeletal: Positive for neck pain.  Skin: Negative for rash.  Neurological: Positive for headaches. Negative for weakness.  All other systems reviewed and are negative.    Physical Exam Updated Vital Signs BP 108/61 (BP Location: Right Arm)   Pulse 93   Temp (!) 102.1 F (38.9 C) (Rectal)   Resp 20   Ht 1.854 m (6' 1" )   Wt (!) 152.4 kg   SpO2 95%   BMI 44.33 kg/m   Physical Exam  CONSTITUTIONAL: Well developed/well nourished HEAD: Normocephalic/atraumatic EYES: EOMI/PERRL ENMT: Mucous membranes moist uvula midline without erythema or exudate NECK: supple no meningeal signs SPINE/BACK:entire spine nontender, well-healed lumbar scar CV: S1/S2 noted, no murmurs/rubs/gallops noted LUNGS: Lungs are clear to auscultation bilaterally, no apparent distress ABDOMEN: soft, nontender, no rebound or guarding, bowel sounds noted throughout abdomen GU:no cva tenderness NEURO: Pt is awake/alert/appropriate, moves all extremitiesx4.  No facial droop.   EXTREMITIES: pulses normal/equal, full ROM, see photo SKIN: warm, color normal PSYCH: no abnormalities of mood noted, alert and oriented to situation        ED Treatments / Results  Labs (all labs ordered are listed, but only abnormal results are displayed) Labs Reviewed  COMPREHENSIVE METABOLIC PANEL - Abnormal; Notable for the following components:      Result Value   Glucose, Bld 176 (*)    All other components within normal limits  CBC WITH DIFFERENTIAL/PLATELET - Abnormal; Notable for the following components:   RBC 3.87 (*)    Hemoglobin 12.3 (*)    HCT 34.9 (*)    Neutro Abs 8.0 (*)    Lymphs Abs 0.3 (*)    All other components within normal limits  CULTURE, BLOOD (ROUTINE X 2)  CULTURE, BLOOD (ROUTINE X 2)  CSF CULTURE  GRAM STAIN  URINALYSIS, ROUTINE W REFLEX MICROSCOPIC  ROCKY MTN SPOTTED FVR ABS PNL(IGG+IGM)  CSF CELL COUNT WITH DIFFERENTIAL  CSF CELL COUNT WITH  DIFFERENTIAL  GLUCOSE, CSF  PROTEIN, CSF  VDRL, CSF  ARBOVIRUS IGG, CSF  HIV ANTIBODY (ROUTINE TESTING)  COMPREHENSIVE METABOLIC PANEL  CBC  SEDIMENTATION RATE  I-STAT CG4 LACTIC ACID,  ED    EKG EKG Interpretation  Date/Time:  Friday March 14 2018 22:53:35 EDT Ventricular Rate:  93 PR Interval:    QRS Duration: 89 QT Interval:  338 QTC Calculation: 421 R Axis:   -48 Text Interpretation:  Sinus rhythm Left anterior fascicular block Abnormal R-wave progression, early transition Confirmed by Ripley Fraise [phone]) on 03/15/2018 12:07:17 AM   Radiology Dg Chest Port 1 View  Result Date: 03/15/2018 CLINICAL DATA:  Fever nausea beginning this morning. History of asthma. EXAM: PORTABLE CHEST 1 VIEW COMPARISON:  Chest radiograph September 27, 2015 FINDINGS: Cardiomediastinal silhouette is normal. No pleural effusions or focal consolidations. Trachea projects midline and there is no pneumothorax. Soft tissue planes and included osseous structures are non-suspicious. Old LEFT clavicle fracture. IMPRESSION: No active disease. Electronically Signed   By: Elon Alas M.D.   On: 03/15/2018 00:48    Procedures .Lumbar Puncture Date/Time: 03/15/2018 3:07 AM Performed by: Ripley Fraise, MD Authorized by: Ripley Fraise, MD   Consent:    Consent obtained:  Written   Consent given by:  Patient   Risks discussed:  Bleeding, infection, pain, repeat procedure and headache Pre-procedure details:    Procedure purpose:  Diagnostic Anesthesia (see MAR for exact dosages):    Anesthesia method:  Local infiltration   Local anesthetic:  Lidocaine 1% w/o epi Procedure details:    Lumbar space:  L3-L4 interspace   Ultrasound guidance: no     Number of attempts:  1 Comments:     Procedure not successful, no CSF was extracted    Medications Ordered in ED Medications  vancomycin (VANCOCIN) IVPB 1000 mg/200 mL premix (1,000 mg Intravenous New Bag/Given 03/15/18 0239)  cefTRIAXone  (ROCEPHIN) 2 g in sodium chloride 0.9 % 100 mL IVPB (has no administration in time range)  ampicillin (OMNIPEN) 2 g in sodium chloride 0.9 % 100 mL IVPB (has no administration in time range)  doxycycline (VIBRA-TABS) tablet 100 mg (has no administration in time range)  0.9 %  sodium chloride infusion (has no administration in time range)  acetaminophen (TYLENOL) tablet 650 mg (has no administration in time range)    Or  acetaminophen (TYLENOL) suppository 650 mg (has no administration in time range)  insulin aspart (novoLOG) injection 0-9 Units (has no administration in time range)  insulin aspart (novoLOG) injection 0-5 Units (has no administration in time range)  vancomycin (VANCOCIN) 1,500 mg in sodium chloride 0.9 % 500 mL IVPB (has no administration in time range)  sodium chloride 0.9 % bolus 1,000 mL (0 mLs Intravenous Stopped 03/15/18 0239)  ibuprofen (ADVIL,MOTRIN) tablet 400 mg (400 mg Oral Given 03/15/18 0016)  cefTRIAXone (ROCEPHIN) 2 g in sodium chloride 0.9 % 100 mL IVPB (0 g Intravenous Stopped 03/15/18 0227)  traMADol (ULTRAM) tablet 50 mg (50 mg Oral Given 03/15/18 0149)     Initial Impression / Assessment and Plan / ED Course  I have reviewed the triage vital signs and the nursing notes.  Pertinent labs & imaging results that were available during my care of the patient were reviewed by me and considered in my medical decision making (see chart for details).     12:10 AM Patient presents with fever myalgias.  He is well-appearing, blood pressure is appropriate. He could be developing an early cellulitis to his lower extremities.  Labs and imaging pending At this time 3:08 AM Patient with fever, headache and neck pain.  He may have an early rash/cellulitis on his legs, but no other source of fever.  Concern for meningitis.  He was given broad-spectrum antibiotics.  I did make one attempted a lumbar puncture, but this was unsuccessful as it was limited due to body habitus, and  previous lumbar surgeries.  He will need to have LP done under fluoroscopy as an inpatient.  Discussed with Dr. Maudie Mercury for admission Final Clinical Impressions(s) / ED Diagnoses   Final diagnoses:  Acute febrile illness    ED Discharge Orders    None       Ripley Fraise, MD 03/15/18 [phone]

## 2018-03-15 NOTE — Progress Notes (Signed)
  Vital Signs MEWS/VS Documentation      03/15/2018 1118 03/15/2018 1213 03/15/2018 1430 03/15/2018 1852   MEWS Score:  4  3  2  2    MEWS Score Color:  Red  Yellow  Yellow  Yellow   Resp:  -  -  (!) 24  -   Pulse:  -  -  77  -   BP:  -  -  (!) 100/49  -   Temp:  (!) 102.6 F (39.2 C)  (!) 101 F (38.3 C)  99.9 F (37.7 C)  99.7 F (37.6 C)   O2 Device:  -  -  Room Air  -           [name], Robert [name] 03/15/2018,7:26 PM

## 2018-03-15 NOTE — ED Notes (Signed)
ED TO INPATIENT HANDOFF REPORT  Name/Age/Gender Robert [name] 62 y.o. male  Code Status    Code Status Orders  (From admission, onward)         Start     Ordered   03/15/18 0227  Full code  Continuous     03/15/18 0231        Code Status History    Date Active Date Inactive Code Status Order ID Comments User Context   09/22/2011 1926 09/25/2011 1629 Full Code [id number]  Sid Falcon, RN Inpatient      Home/SNF/Other Home  Chief Complaint fever  Level of Care/Admitting Diagnosis ED Disposition    ED Disposition Condition [place]: [facility] [[id number]]  Level of Care: Telemetry [5]  Admit to tele based on following criteria: Monitor for Ischemic changes  Diagnosis: Fever [903009]  Admitting Physician: Jani Gravel [[id number]]  Attending Physician: Jani Gravel [[id number]]  PT Class (Do Not Modify): Observation [104]  PT Acc Code (Do Not Modify): Observation [10022]       Medical History Past Medical History:  Diagnosis Date  . Allergic rhinitis due to pollen   . Arthritis   . Asthma   . Back pain   . Barrett esophagus   . Depressive disorder   . Exposure to hepatitis B   . Exposure to hepatitis C   . Narcotic abuse (HCC)    pain medications  . OSA (obstructive sleep apnea)    on CPAP     Allergies No Known Allergies  IV Location/Drains/Wounds Patient Lines/Drains/Airways Status   Active Line/Drains/Airways    Name:   Placement date:   Placement time:   Site:   Days:   Peripheral IV 03/14/18 Left Antecubital   03/14/18    2306    Antecubital   1          Labs/Imaging Results for orders placed or performed during the hospital encounter of 03/14/18 (from the past 48 hour(s))  Comprehensive metabolic panel     Status: Abnormal   Collection Time: 03/15/18 12:12 AM  Result Value Ref Range   Sodium 138 135 - 145 mmol/L   Potassium 4.1 3.5 - 5.1 mmol/L   Chloride 104 98 - 111 mmol/L   CO2 22 22 - 32 mmol/L   Glucose, Bld 176 (H) 70 - 99 mg/dL   BUN 21 8 - 23 mg/dL   Creatinine, Ser 1.08 0.61 - 1.24 mg/dL   Calcium 9.2 8.9 - 10.3 mg/dL   Total Protein 6.9 6.5 - 8.1 g/dL   Albumin 4.0 3.5 - 5.0 g/dL   AST 26 15 - 41 U/L   ALT 25 0 - 44 U/L   Alkaline Phosphatase 50 38 - 126 U/L   Total Bilirubin 1.0 0.3 - 1.2 mg/dL   GFR calc non Af Amer >60 >60 mL/min   GFR calc Af Amer >60 >60 mL/min    Comment: (NOTE) The eGFR has been calculated using the CKD EPI equation. This calculation has not been validated in all clinical situations. eGFR's persistently <60 mL/min signify possible Chronic Kidney Disease.    Anion gap 12 5 - 15    Comment: Performed at [facility], [place] [address] [place], [place] [address]  CBC WITH DIFFERENTIAL     Status: Abnormal   Collection Time: 03/15/18 12:12 AM  Result Value Ref Range   WBC 9.3 4.0 - 10.5 K/uL   RBC 3.87 (L) 4.22 -  5.81 MIL/uL   Hemoglobin 12.3 (L) 13.0 - 17.0 g/dL   HCT 34.9 (L) 39.0 - 52.0 %   MCV 90.2 78.0 - 100.0 fL   MCH 31.8 26.0 - 34.0 pg   MCHC 35.2 30.0 - 36.0 g/dL   RDW 13.1 11.5 - 15.5 %   Platelets 150 150 - 400 K/uL   Neutrophils Relative % 87 %   Neutro Abs 8.0 (H) 1.7 - 7.7 K/uL   Lymphocytes Relative 4 %   Lymphs Abs 0.3 (L) 0.7 - 4.0 K/uL   Monocytes Relative 9 %   Monocytes Absolute 0.9 0.1 - 1.0 K/uL   Eosinophils Relative 0 %   Eosinophils Absolute 0.0 0.0 - 0.7 K/uL   Basophils Relative 0 %   Basophils Absolute 0.0 0.0 - 0.1 K/uL    Comment: Performed at [facility], [place] [address], [place], [place] [address]  I-Stat CG4 Lactic Acid, ED  (not at  [facility])     Status: None   Collection Time: 03/15/18 12:15 AM  Result Value Ref Range   Lactic Acid, Venous 1.89 0.5 - 1.9 mmol/L  Blood culture (routine x 2)     Status: None (Preliminary result)   Collection Time: 03/15/18 12:52 AM  Result Value Ref Range   Specimen Description BLOOD RIGHT HAND    Special Requests      BOTTLES DRAWN  AEROBIC AND ANAEROBIC Blood Culture results may not be optimal due to an inadequate volume of blood received in culture bottles Performed at [place] [address], [place], [place] [address]    Culture PENDING    Report Status PENDING   Blood culture (routine x 2)     Status: None (Preliminary result)   Collection Time: 03/15/18  1:09 AM  Result Value Ref Range   Specimen Description BLOOD RIGHT HAND    Special Requests      BOTTLES DRAWN AEROBIC AND ANAEROBIC Blood Culture adequate volume Performed at [facility], [place] [address], [place], [place] [address]    Culture PENDING    Report Status PENDING   Urinalysis, Routine w reflex microscopic     Status: None   Collection Time: 03/15/18  2:35 AM  Result Value Ref Range   Color, Urine YELLOW YELLOW   APPearance CLEAR CLEAR   Specific Gravity, Urine 1.021 1.005 - 1.030   pH 5.0 5.0 - 8.0   Glucose, UA NEGATIVE NEGATIVE mg/dL   Hgb urine dipstick NEGATIVE NEGATIVE   Bilirubin Urine NEGATIVE NEGATIVE   Ketones, ur NEGATIVE NEGATIVE mg/dL   Protein, ur NEGATIVE NEGATIVE mg/dL   Nitrite NEGATIVE NEGATIVE   Leukocytes, UA NEGATIVE NEGATIVE    Comment: Performed at [facility], [place] [address], [place], [place] [address]   Dg Chest Port 1 View  Result Date: 03/15/2018 CLINICAL DATA:  Fever nausea beginning this morning. History of asthma. EXAM: PORTABLE CHEST 1 VIEW COMPARISON:  Chest radiograph September 27, 2015 FINDINGS: Cardiomediastinal silhouette is normal. No pleural effusions or focal consolidations. Trachea projects midline and there is no pneumothorax. Soft tissue planes and included osseous structures are non-suspicious. Old LEFT clavicle fracture. IMPRESSION: No active disease. Electronically Signed   By: Elon Alas M.D.   On: 03/15/2018 00:48    Pending Labs Unresulted Labs (From admission, onward)    Start     Ordered   03/15/18 0500  Comprehensive metabolic panel  Tomorrow  morning,   R  03/15/18 0231   03/15/18 0500  CBC  Tomorrow morning,   R     03/15/18 0231   03/15/18 0228  Sedimentation rate  Add-on,   R     03/15/18 0231   03/15/18 0227  HIV antibody (Routine Testing)  Once,   R     03/15/18 0231   03/15/18 0226  Arbovirus IgG, CSF  [facility] ED ADULT CSF PANEL)  STAT,   STAT     03/15/18 0225   03/15/18 0225  CSF cell count with differential collection tube #: 1  [facility] ED ADULT CSF PANEL)  STAT,   STAT    Question:  collection tube #  Answer:  1   03/15/18 0225   03/15/18 0225  CSF cell count with differential collection tube #: 4  [facility] ED ADULT CSF PANEL)  STAT,   STAT    Question:  collection tube #  Answer:  4   03/15/18 0225   03/15/18 0225  CSF culture  [facility] ED ADULT CSF PANEL)  STAT,   STAT    Question:  Are there also cytology or pathology orders on this specimen?  Answer:  Yes   03/15/18 0225   03/15/18 0225  Gram stain  [facility] ED ADULT CSF PANEL)  STAT,   STAT     03/15/18 0225   03/15/18 0225  Glucose, CSF  [facility] ED ADULT CSF PANEL)  STAT,   STAT     03/15/18 0225   03/15/18 0225  Protein, CSF  [facility] ED ADULT CSF PANEL)  STAT,   STAT     03/15/18 0225   03/15/18 0225  VDRL, CSF  [facility] ED ADULT CSF PANEL)  STAT,   STAT     03/15/18 0225   03/14/18 2332  Rocky mtn spotted fvr abs pnl(IgG+IgM)  Once,   R     03/14/18 2331          Vitals/Pain Today's Vitals   03/14/18 2303 03/14/18 2346 03/15/18 0100 03/15/18 0200  BP:   103/66 (!) 101/47  Pulse:   74 73  Resp:   (!) 21 19  Temp:  (!) 102.1 F (38.9 C)    TempSrc:  Rectal    SpO2:   93% 94%  Weight: (!) 152.4 kg     Height: 6' 1"  (1.854 m)     PainSc: 7        Isolation Precautions No active isolations  Medications Medications  vancomycin (VANCOCIN) IVPB 1000 mg/200 mL premix (1,000 mg Intravenous New Bag/Given 03/15/18 0239)  cefTRIAXone (ROCEPHIN) 2 g in sodium chloride 0.9 % 100 mL IVPB (has no administration in time range)  ampicillin (OMNIPEN) 2 g in sodium chloride 0.9  % 100 mL IVPB (has no administration in time range)  doxycycline (VIBRA-TABS) tablet 100 mg (has no administration in time range)  0.9 %  sodium chloride infusion (has no administration in time range)  acetaminophen (TYLENOL) tablet 650 mg (has no administration in time range)    Or  acetaminophen (TYLENOL) suppository 650 mg (has no administration in time range)  insulin aspart (novoLOG) injection 0-9 Units (has no administration in time range)  insulin aspart (novoLOG) injection 0-5 Units (has no administration in time range)  vancomycin (VANCOCIN) 1,500 mg in sodium chloride 0.9 % 500 mL IVPB (has no administration in time range)  sodium chloride 0.9 % bolus 1,000 mL (0 mLs Intravenous Stopped 03/15/18 0239)  ibuprofen (ADVIL,MOTRIN) tablet 400 mg (400 mg Oral Given 03/15/18  0016)  cefTRIAXone (ROCEPHIN) 2 g in sodium chloride 0.9 % 100 mL IVPB (0 g Intravenous Stopped 03/15/18 0227)  traMADol (ULTRAM) tablet 50 mg (50 mg Oral Given 03/15/18 0149)    Mobility Walks normally

## 2018-03-15 NOTE — Progress Notes (Signed)
   03/15/18 1430  Vitals  Temp 99.9 F (37.7 C)  Temp Source Oral  BP (!) 100/49 (nurse notified)  MAP (mmHg) (!) 64  BP Location Right Arm  BP Method Automatic  Patient Position (if appropriate) Lying  Pulse Rate 77  Pulse Rate Source Monitor  Resp (!) 24 (nurse notified.)  Oxygen Therapy  SpO2 95 %  O2 Device Room Air

## 2018-03-15 NOTE — Progress Notes (Signed)
Have spoken with MD at this time concerning pt's fever, headache, BP and HR. New orders noted.

## 2018-03-16 DIAGNOSIS — Z7984 Long term (current) use of oral hypoglycemic drugs: Secondary | ICD-10-CM | POA: Diagnosis not present

## 2018-03-16 DIAGNOSIS — Z96652 Presence of left artificial knee joint: Secondary | ICD-10-CM | POA: Diagnosis present

## 2018-03-16 DIAGNOSIS — M19042 Primary osteoarthritis, left hand: Secondary | ICD-10-CM | POA: Diagnosis present

## 2018-03-16 DIAGNOSIS — K219 Gastro-esophageal reflux disease without esophagitis: Secondary | ICD-10-CM | POA: Diagnosis present

## 2018-03-16 DIAGNOSIS — I10 Essential (primary) hypertension: Secondary | ICD-10-CM | POA: Diagnosis present

## 2018-03-16 DIAGNOSIS — E1140 Type 2 diabetes mellitus with diabetic neuropathy, unspecified: Secondary | ICD-10-CM | POA: Diagnosis present

## 2018-03-16 DIAGNOSIS — R509 Fever, unspecified: Secondary | ICD-10-CM | POA: Diagnosis not present

## 2018-03-16 DIAGNOSIS — Z87891 Personal history of nicotine dependence: Secondary | ICD-10-CM | POA: Diagnosis not present

## 2018-03-16 DIAGNOSIS — Z791 Long term (current) use of non-steroidal anti-inflammatories (NSAID): Secondary | ICD-10-CM | POA: Diagnosis not present

## 2018-03-16 DIAGNOSIS — Z6841 Body Mass Index (BMI) 40.0 and over, adult: Secondary | ICD-10-CM | POA: Diagnosis not present

## 2018-03-16 DIAGNOSIS — L03115 Cellulitis of right lower limb: Principal | ICD-10-CM

## 2018-03-16 DIAGNOSIS — E559 Vitamin D deficiency, unspecified: Secondary | ICD-10-CM | POA: Diagnosis present

## 2018-03-16 DIAGNOSIS — J301 Allergic rhinitis due to pollen: Secondary | ICD-10-CM | POA: Diagnosis present

## 2018-03-16 DIAGNOSIS — D649 Anemia, unspecified: Secondary | ICD-10-CM | POA: Diagnosis present

## 2018-03-16 DIAGNOSIS — Z8349 Family history of other endocrine, nutritional and metabolic diseases: Secondary | ICD-10-CM | POA: Diagnosis not present

## 2018-03-16 DIAGNOSIS — G4733 Obstructive sleep apnea (adult) (pediatric): Secondary | ICD-10-CM | POA: Diagnosis present

## 2018-03-16 LAB — COMPREHENSIVE METABOLIC PANEL
ALBUMIN: 3.4 g/dL — AB (ref 3.5–5.0)
ALT: 23 U/L (ref 0–44)
AST: 24 U/L (ref 15–41)
Alkaline Phosphatase: 40 U/L (ref 38–126)
Anion gap: 9 (ref 5–15)
BUN: 17 mg/dL (ref 8–23)
CHLORIDE: 105 mmol/L (ref 98–111)
CO2: 24 mmol/L (ref 22–32)
CREATININE: 0.78 mg/dL (ref 0.61–1.24)
Calcium: 8.6 mg/dL — ABNORMAL LOW (ref 8.9–10.3)
GFR calc Af Amer: >60 mL/min (ref >60)
GFR calc non Af Amer: >60 mL/min (ref >60)
GLUCOSE: 195 mg/dL — AB (ref 70–99)
Potassium: 3.6 mmol/L (ref 3.5–5.1)
Sodium: 138 mmol/L (ref 135–145)
Total Bilirubin: 0.6 mg/dL (ref 0.3–1.2)
Total Protein: 6.3 g/dL — ABNORMAL LOW (ref 6.5–8.1)

## 2018-03-16 LAB — CBC WITH DIFFERENTIAL/PLATELET
BASOS ABS: 0 10*3/uL (ref 0.0–0.1)
BASOS PCT: 0 %
Eosinophils Absolute: 0.1 10*3/uL (ref 0.0–0.7)
Eosinophils Relative: 1 %
HCT: 31.8 % — ABNORMAL LOW (ref 39.0–52.0)
Hemoglobin: 11.2 g/dL — ABNORMAL LOW (ref 13.0–17.0)
LYMPHS PCT: 13 %
Lymphs Abs: 0.9 10*3/uL (ref 0.7–4.0)
MCH: 31.6 pg (ref 26.0–34.0)
MCHC: 35.2 g/dL (ref 30.0–36.0)
MCV: 89.8 fL (ref 78.0–100.0)
MONO ABS: 0.7 10*3/uL (ref 0.1–1.0)
Monocytes Relative: 10 %
NEUTROS ABS: 5.4 10*3/uL (ref 1.7–7.7)
Neutrophils Relative %: 76 %
PLATELETS: 135 10*3/uL — AB (ref 150–400)
RBC: 3.54 MIL/uL — AB (ref 4.22–5.81)
RDW: 13.2 % (ref 11.5–15.5)
WBC: 7.2 10*3/uL (ref 4.0–10.5)

## 2018-03-16 LAB — GLUCOSE, CAPILLARY
Glucose-Capillary: 108 mg/dL — ABNORMAL HIGH (ref 70–99)
Glucose-Capillary: 106 mg/dL — ABNORMAL HIGH (ref 70–99)
Glucose-Capillary: 99 mg/dL (ref 70–99)
Glucose-Capillary: 125 mg/dL — ABNORMAL HIGH (ref 70–99)

## 2018-03-16 LAB — CK: Total CK: 452 U/L — ABNORMAL HIGH (ref 49–397)

## 2018-03-16 LAB — C-REACTIVE PROTEIN: CRP: 14.8 mg/dL — ABNORMAL HIGH (ref <1.0)

## 2018-03-16 MED ORDER — ENOXAPARIN SODIUM 80 MG/0.8ML ~~LOC~~ SOLN
80.0000 mg | SUBCUTANEOUS | Status: DC
  Administered 2018-03-16: 80 mg via SUBCUTANEOUS
  Filled 2018-03-16: qty 0.8

## 2018-03-16 MED ORDER — SODIUM CHLORIDE 0.9 % IV SOLN
2.0000 g | INTRAVENOUS | Status: DC
  Administered 2018-03-16: 2 g via INTRAVENOUS
  Filled 2018-03-16: qty 2

## 2018-03-16 MED ORDER — MELOXICAM 15 MG PO TABS
15.0000 mg | ORAL_TABLET | Freq: Every day | ORAL | Status: DC
  Administered 2018-03-16 – 2018-03-17 (×2): 15 mg via ORAL
  Filled 2018-03-16 (×2): qty 1

## 2018-03-16 MED ORDER — HYDROCHLOROTHIAZIDE 12.5 MG PO CAPS
12.5000 mg | ORAL_CAPSULE | Freq: Every day | ORAL | Status: DC
  Administered 2018-03-16 – 2018-03-17 (×2): 12.5 mg via ORAL
  Filled 2018-03-16 (×2): qty 1

## 2018-03-16 NOTE — Progress Notes (Addendum)
[facility] Progress Note  Patient: Robert [name] DGL:[id number]   PCP: [name], DO DOB: Jun 23, 1956   DOA: 03/14/2018   DOS: 03/16/2018   Date of Service: the patient was seen and examined on 03/16/2018  Subjective: Feeling better.  Headache is better with medication.  No nausea no vomiting.  Eating okay.  No diarrhea no constipation.  Peeing okay.  No focal deficit.  No neck stiffness no neck pain.  No photophobia anymore.  Brief hospital course: Pt. with PMH of morbid obesity, OSA on CPAP, GERD; admitted on 03/14/2018, presented with complaint of fever and nausea, was found to have cellulitis. Currently further plan is continue IV antibiotics.  Assessment and Plan: 1.  Right leg cellulitis. Patient has redness as well as warmth involving the right leg along with some rash. Patient did sustain some injury on his right second toe 2 weeks ago. X-ray foot as well as x-ray tibia-fibula were performed, no acute abnormality other than soft tissue swelling. ESR normal CRP significantly elevated. Patient was febrile yesterday no fever this morning. Initially there was some concern for possible meningitis with patient's complaint of photophobia although discussed with ID and on my evaluation I do not feel that the patient actually has any evidence of meningitis with no evidence of neck stiffness or rigidity and alternate explanation of foot cellulitis clearly visible. Continue to monitor blood cultures. Antibiotic changed from IV vancomycin, Rocephin, ampicillin and doxycycline to IV ceftriaxone to cover for strep cellulitis and continuing doxycycline to cover for staph, MRSA as well as concern for RMSF. Titers are still pending.  2.  Headache. This is likely in response to fever as well as noncompliance with OSA CPAP. Continue Fioricet headache is much better.  CT head unremarkable. No focal deficit again. PRN Norco for now.  3.  Essential hypertension. Patient is on olmesartan  switch to irbesartan here in the hospital. Amlodipine currently on hold. HCTZ resumed today.  4.  OSA. Continue CPAP at night.  5.  Type 2 diabetes mellitus.  Possibly neuropathy with long-term complication.  Not using insulin We will check hemoglobin A1c.  Holding metformin. Continue sliding scale insulin.  Diet: Cardiac and carb modified diet DVT Prophylaxis: subcutaneous Heparin Advance goals of care discussion: Full code  Family Communication: no family was present at bedside, at the time of interview.   Disposition:  Discharge to home.  Consultants: none Procedures: none  Antibiotics: Anti-infectives (From admission, onward)   Start     Dose/Rate Route Frequency Ordered Stop   03/16/18 2200  cefTRIAXone (ROCEPHIN) 2 g in sodium chloride 0.9 % 100 mL IVPB     2 g 200 mL/hr over 30 Minutes Intravenous Every 24 hours 03/16/18 0840     03/15/18 1800  vancomycin (VANCOCIN) 1,250 mg in sodium chloride 0.9 % 250 mL IVPB  Status:  Discontinued     1,250 mg 166.7 mL/hr over 90 Minutes Intravenous Every 12 hours 03/15/18 0556 03/15/18 1021   03/15/18 1000  cefTRIAXone (ROCEPHIN) 2 g in sodium chloride 0.9 % 100 mL IVPB  Status:  Discontinued     2 g 200 mL/hr over 30 Minutes Intravenous Every 12 hours 03/15/18 0231 03/16/18 0840   03/15/18 0400  ampicillin (OMNIPEN) 2 g in sodium chloride 0.9 % 100 mL IVPB  Status:  Discontinued     2 g 300 mL/hr over 20 Minutes Intravenous Every 4 hours 03/15/18 0231 03/15/18 1021   03/15/18 0315  vancomycin (VANCOCIN) 1,500 mg in sodium chloride  0.9 % 500 mL IVPB     1,500 mg 250 mL/hr over 120 Minutes Intravenous  Once 03/15/18 0301 03/15/18 0635   03/15/18 0245  doxycycline (VIBRA-TABS) tablet 100 mg     100 mg Oral Every 12 hours 03/15/18 0231     03/15/18 0115  vancomycin (VANCOCIN) IVPB 1000 mg/200 mL premix     1,000 mg 200 mL/hr over 60 Minutes Intravenous  Once 03/15/18 0114 03/15/18 0339   03/15/18 0115  cefTRIAXone (ROCEPHIN) 2  g in sodium chloride 0.9 % 100 mL IVPB     2 g 200 mL/hr over 30 Minutes Intravenous  Once 03/15/18 0114 03/15/18 0227       Objective: Physical Exam: Vitals:   03/15/18 2008 03/15/18 2232 03/16/18 0441 03/16/18 0708  BP: (!) 118/56  (!) 106/53   Pulse: 86 69 72   Resp: _0 Temp: 99.4 F (37.4 C)  99 F (37.2 C) 98.7 F (37.1 C)  TempSrc: Oral  Oral Oral  SpO2: 97% 93%    Weight:   (!) 164 kg   Height:        Intake/Output Summary (Last 24 hours) at 03/16/2018 1100 Last data filed at 03/16/2018 0600 Gross per 24 hour  Intake 1560 ml  Output 2700 ml  Net -1140 ml   Filed Weights   03/15/18 0500 03/15/18 1509 03/16/18 0441  Weight: (!) 159.4 kg (!) 164.4 kg (!) 164 kg   General: Alert, Awake and Oriented to Time, Place and Person. Appear in mild distress, affect appropriate Eyes: PERRL, Conjunctiva normal ENT: Oral Mucosa clear moist Neck: difficult to assess  JVD, no Abnormal Mass Or lumps Cardiovascular: S1 and S2 Present, no Murmur, Peripheral Pulses Present Respiratory: normal respiratory effort, Bilateral Air entry equal and Decreased, no use of accessory muscle, Clear to Auscultation, no Crackles, no wheezes Abdomen: Bowel Sound present, Soft and no tenderness, no hernia Skin: no redness, redness and diffuse macular rash involving the right foot, no induration Extremities: bilateral right more than left pedal edema, no calf tenderness Neurologic: Grossly no focal neuro deficit. Bilaterally Equal motor strength  Data Reviewed: CBC: Recent Labs  Lab 03/15/18 0012 03/15/18 0452  WBC 9.3 10.4  NEUTROABS 8.0*  --   HGB 12.3* 14.5  HCT 34.9* 41.7  MCV 90.2 91.2  PLT 150 809*   Basic Metabolic Panel: Recent Labs  Lab 03/15/18 0012 03/15/18 0452 03/16/18 0858  NA 138 138 138  K 4.1 4.7 3.6  CL 104 102 105  CO2 22 21* 24  GLUCOSE 176* 141* 195*  BUN _1 CREATININE 1.08 1.09 0.78  CALCIUM 9.2 9.1 8.6*    Liver Function Tests: Recent Labs    Lab 03/15/18 0012 03/15/18 0452 03/16/18 0858  AST _2 ALT _3 ALKPHOS 50 53 40  BILITOT 1.0 1.0 0.6  PROT 6.9 7.6 6.3*  ALBUMIN 4.0 4.3 3.4*   No results for input(s): LIPASE, AMYLASE in the last 168 hours. No results for input(s): AMMONIA in the last 168 hours. Coagulation Profile: No results for input(s): INR, PROTIME in the last 168 hours. Cardiac Enzymes: Recent Labs  Lab 03/16/18 0858  CKTOTAL 452*   BNP (last 3 results) No results for input(s): PROBNP in the last 8760 hours. CBG: Recent Labs  Lab 03/15/18 0822 03/15/18 1206 03/15/18 1720 03/15/18 2012 03/16/18 0722  GLUCAP 161* 141* 102* 133* 108*   Studies: No results found.  Scheduled Meds: .  cholecalciferol  5,000 Units Oral Daily  . doxycycline  100 mg Oral Q12H  . hydrochlorothiazide  12.5 mg Oral Daily  . insulin aspart  0-5 Units Subcutaneous QHS  . insulin aspart  0-9 Units Subcutaneous TID WC  . irbesartan  150 mg Oral Daily  . meloxicam  15 mg Oral Daily  . [START ON 03/21/2018] Vitamin D (Ergocalciferol)  50,000 Units Oral Q Fri   Continuous Infusions: . cefTRIAXone (ROCEPHIN)  IV     PRN Meds: acetaminophen **OR** acetaminophen, albuterol, butalbital-acetaminophen-caffeine, cyclobenzaprine, HYDROcodone-acetaminophen, naphazoline-pheniramine  Time spent: 35 minutes  Author: Berle Mull, MD [facility] Pager: [phone] 03/16/2018 11:00 AM  If 7PM-7AM, please contact night-coverage at [email or web address], password [facility]

## 2018-03-17 LAB — GLUCOSE, CAPILLARY
Glucose-Capillary: 115 mg/dL — ABNORMAL HIGH (ref 70–99)
Glucose-Capillary: 110 mg/dL — ABNORMAL HIGH (ref 70–99)

## 2018-03-17 LAB — HEMOGLOBIN A1C
HEMOGLOBIN A1C: 5.8 % — AB (ref 4.8–5.6)
Mean Plasma Glucose: 119.76 mg/dL

## 2018-03-17 MED ORDER — CEPHALEXIN 500 MG PO CAPS
500.0000 mg | ORAL_CAPSULE | Freq: Two times a day (BID) | ORAL | Status: DC
  Administered 2018-03-17: 500 mg via ORAL
  Filled 2018-03-17: qty 1

## 2018-03-17 MED ORDER — BUTALBITAL-APAP-CAFFEINE 50-325-40 MG PO TABS: 1.0000 | ORAL_TABLET | Freq: Four times a day (QID) | ORAL | 0 refills | Status: DC | PRN

## 2018-03-17 MED ORDER — CEPHALEXIN 500 MG PO CAPS: 500.0000 mg | ORAL_CAPSULE | Freq: Two times a day (BID) | ORAL | 0 refills | Status: AC

## 2018-03-17 MED ORDER — DOXYCYCLINE HYCLATE 100 MG PO TABS: 100.0000 mg | ORAL_TABLET | Freq: Two times a day (BID) | ORAL | 0 refills | Status: AC

## 2018-03-17 MED ORDER — HYDROCHLOROTHIAZIDE 12.5 MG PO CAPS: 12.5000 mg | ORAL_CAPSULE | Freq: Every day | ORAL | 0 refills | Status: DC

## 2018-03-17 NOTE — Care Management Note (Addendum)
Case Management Note  Patient Details  Name: Robert [name] MRN: [id number] Date of Birth: 05-07-1956  SuSubjective: Feeling better.  Headache is better with medication.  No nausea no vomiting.  Eating okay.  No diarrhea no constipation.  Peeing okay.  No focal deficit.  No neck stiffness no neck pain.  No photophobia anymore.  Brief hospital course: Pt. with PMH of morbid obesity, OSA on CPAP, GERD; admitted on 03/14/2018, presented with complaint of fever and nausea, was found to have cellulitis. Currently further plan is continue IV antibiotics.  Assessment and Plan: 1.  Right leg cellulitis. Patient has redness as well as warmth involving the right leg along with some rash. Patient did sustain some injury on his right second toe 2 weeks ago. X-ray foot as well as x-ray tibia-fibula were performed, no acute abnormality other than soft tissue swelling. ESR normal CRP significantly elevated. Patient was febrile yesterday no fever this morning. Initially there was some concern for possible meningitis with patient's complaint of photophobia although discussed with ID and on my evaluation I do not feel that the patient actually has any evidence of meningitis with no evidence of neck stiffness or rigidity and alternate explanation of foot cellulitis clearly visible. Continue to monitor blood cultures. Antibiotic changed from IV vancomycin, Rocephin, ampicillin and doxycycline to IV ceftriaxone to cover for strep cellulitis and continuing doxycycline to cover for staph, MRSA as well as concern for RMSF. Titers are still pending.  2.  Headache. This is likely in response to fever as well as noncompliance with OSA CPAP. Continue Fioricet headache is much better.  CT head unremarkable. No focal deficit again. PRN Norco for now.  3.  Essential hypertension. Patient is on olmesartan switch to irbesartan here in the hospital. Amlodipine currently on hold. HCTZ resumed today.  4.   OSA. Continue CPAP at night.  5.  Type 2 diabetes mellitus.  Possibly neuropathy with long-term complication.  Not using insulin We will check hemoglobin A1c.  Holding metformin. Continue sliding scale insulin. bjective/Objective:                    Action/Plan: Goal Length of Stay: Ambulatory or 2 days  Note: Goal Length of Stay assumes optimal recovery, decision making, and care. Patients may be discharged to a lower level of care (either later than or sooner than the goal) when it is appropriate for their clinical status and care needs. Discharge Readiness Return to top of Cellulitis RRG - [place]  Discharge readiness is indicated by patient meeting Recovery Milestones, including ALL of the following: ? Hemodynamic stability=yes ? Skin exam stable or improved- improved but remains swollen and reddned ? Mental status at baseline alert and reactive ? Fever absent or improved had fever on [id number] ? Antibiotic treatment needs ? Iv roechpin 2gms q24 hours ?  appropriate for next level of care ? Pain absent or manageable at next level of care=manageable ? Ambulatory yes ? Oral hydration, medications,[M] and diet reg diet/ iv and meds as above ? Discharge plans and education understood not ready due to continued swelling and reddness of the rt leg/iv meds and pain control.   Expected Discharge Date:  03/18/19               Expected Discharge Plan:  Home/Self Care  In-House Referral:     Discharge planning Services  CM Consult  Post Acute Care Choice:    Choice offered to:     DME Arranged:  DME Agency:     HH Arranged:    HH Agency:     Status of Service:  In process, will continue to follow Discharge to home on po abx If discussed at [place] of Stay Meetings, dates discussed:    Additional Comments:  Leeroy Cha, RN 03/17/2018, 11:30 AM

## 2018-03-17 NOTE — Evaluation (Signed)
Physical Therapy Evaluation Patient Details Name: Robert [name] MRN: [id number] DOB: [birth year] Today's Date: 03/17/2018   History of Present Illness  Pt. with PMH of morbid obesity, OSA on CPAP, GERD; admitted on 03/14/2018, presented with complaint of fever and nausea, was found to have cellulitis  Clinical Impression  The patient is ambulatory today. Noted redness of the right leg which was more pronounced dependent. Recommended to patient to elevate and consider support  Stockings if PCP agrees with follow up visit. No further PT needs.    Follow Up Recommendations No PT follow up    Equipment Recommendations  None recommended by PT    Recommendations for Other Services       Precautions / Restrictions Precautions Precautions: Fall      Mobility  Bed Mobility Overal bed mobility: Independent                Transfers Overall transfer level: Independent                  Ambulation/Gait Ambulation/Gait assistance: Modified independent (Device/Increase time) Gait Distance (Feet): 300 Feet         General Gait Details: patient pushed IV pole, offered to get a cane, patient felt comfortable with pole  Stairs            Wheelchair Mobility    Modified Rankin (Stroke Patients Only)       Balance                                             Pertinent Vitals/Pain Pain Assessment: Faces Faces Pain Scale: Hurts even more Pain Location: right leg Pain Intervention(s): Monitored during session    Home Living Family/patient expects to be discharged to:: Private residence   Available Help at Discharge: Family Type of Home: House Home Access: Stairs to enter Entrance Stairs-Rails: Doctor, general practice of Steps: 4 Home Layout: One level Home Equipment: Cane - single point      Prior Function Level of Independence: Independent with assistive device(s)               Hand Dominance         Extremity/Trunk Assessment   Upper Extremity Assessment Upper Extremity Assessment: Overall WFL for tasks assessed    Lower Extremity Assessment Lower Extremity Assessment: Overall WFL for tasks assessed    Cervical / Trunk Assessment Cervical / Trunk Assessment: Normal  Communication   Communication: No difficulties  Cognition Arousal/Alertness: Awake/alert Behavior During Therapy: WFL for tasks assessed/performed Overall Cognitive Status: Within Functional Limits for tasks assessed                                        General Comments      Exercises     Assessment/Plan    PT Assessment Patent does not need any further PT services  PT Problem List         PT Treatment Interventions      PT Goals (Current goals can be found in the Care Plan section)  Acute Rehab PT Goals Patient Stated Goal: go home PT Goal Formulation: All assessment and education complete, DC therapy    Frequency     Barriers to discharge        Co-evaluation  AM-PAC PT "6 Clicks" Daily Activity  Outcome Measure Difficulty turning over in bed (including adjusting bedclothes, sheets and blankets)?: None Difficulty moving from lying on back to sitting on the side of the bed? : None Difficulty sitting down on and standing up from a chair with arms (e.g., wheelchair, bedside commode, etc,.)?: None Help needed moving to and from a bed to chair (including a wheelchair)?: None Help needed walking in hospital room?: None Help needed climbing 3-5 steps with a railing? : None 6 Click Score: 24    End of Session   Activity Tolerance: Patient tolerated treatment well Patient left: in bed Nurse Communication: Mobility status PT Visit Diagnosis: Unsteadiness on feet (R26.81);Pain Pain - Right/Left: Right Pain - part of body: Leg    Time: 1203-1230 PT Time Calculation (min) (ACUTE ONLY): 27 min   Charges:   PT Evaluation $PT Eval Low Complexity: 1  Low PT Treatments $Gait Training: 8-22 mins        [place] [facility] [phone]   Rada Hay 03/17/2018, 1:41 PM

## 2018-03-19 LAB — ROCKY MTN SPOTTED FVR ABS PNL(IGG+IGM)
RMSF IgG: NEGATIVE
RMSF IgM: 0.42 index (ref 0.00–0.89)

## 2018-03-20 LAB — CULTURE, BLOOD (ROUTINE X 2)
Culture: NO GROWTH
Culture: NO GROWTH
Special Requests: ADEQUATE

## 2018-03-21 DIAGNOSIS — M25569 Pain in unspecified knee: Secondary | ICD-10-CM | POA: Diagnosis not present

## 2018-03-21 DIAGNOSIS — G894 Chronic pain syndrome: Secondary | ICD-10-CM | POA: Diagnosis not present

## 2018-03-21 DIAGNOSIS — Z79891 Long term (current) use of opiate analgesic: Secondary | ICD-10-CM | POA: Diagnosis not present

## 2018-03-21 DIAGNOSIS — Z79899 Other long term (current) drug therapy: Secondary | ICD-10-CM | POA: Diagnosis not present

## 2018-03-21 DIAGNOSIS — M47816 Spondylosis without myelopathy or radiculopathy, lumbar region: Secondary | ICD-10-CM | POA: Diagnosis not present

## 2018-03-24 ENCOUNTER — Ambulatory Visit (INDEPENDENT_AMBULATORY_CARE_PROVIDER_SITE_OTHER): Payer: Medicare Other | Admitting: Family Medicine

## 2018-03-24 ENCOUNTER — Encounter: Payer: Self-pay | Admitting: Family Medicine

## 2018-03-24 VITALS — BP 110/72 | HR 86 | Ht 73.0 in | Wt 340.0 lb

## 2018-03-24 DIAGNOSIS — L03119 Cellulitis of unspecified part of limb: Secondary | ICD-10-CM

## 2018-03-24 DIAGNOSIS — E118 Type 2 diabetes mellitus with unspecified complications: Secondary | ICD-10-CM | POA: Diagnosis not present

## 2018-03-24 DIAGNOSIS — R509 Fever, unspecified: Secondary | ICD-10-CM | POA: Diagnosis not present

## 2018-03-24 DIAGNOSIS — I872 Venous insufficiency (chronic) (peripheral): Secondary | ICD-10-CM | POA: Diagnosis not present

## 2018-03-24 DIAGNOSIS — G5793 Unspecified mononeuropathy of bilateral lower limbs: Secondary | ICD-10-CM | POA: Diagnosis not present

## 2018-03-24 DIAGNOSIS — Z09 Encounter for follow-up examination after completed treatment for conditions other than malignant neoplasm: Secondary | ICD-10-CM

## 2018-03-24 NOTE — Patient Instructions (Signed)
Lose it app  Behavior Modification Ideas for Weight Management  Weight management involves adopting a healthy lifestyle that includes a knowledge of nutrition and exercise, a positive attitude and the right kind of motivation. Internal motives such as better health, increased energy, self-esteem and personal control increase your chances of lifelong weight management success.  Remember to have realistic goals and think long-term success. Believe in yourself and you can do it. The following information will give you ideas to help you meet your goals.  Control Your Home Environment  Eat only while sitting down at the kitchen or dining room table. Do not eat while watching television, reading, cooking, talking on the phone, standing at the refrigerator or working on the computer. Keep tempting foods out of the house - don't buy them. Keep tempting foods out of sight. Have low-calorie foods ready to eat. Unless you are preparing a meal, stay out of the kitchen. Have healthy snacks at your disposal, such as small pieces of fruit, vegetables, canned fruit, pretzels, low-fat string cheese and nonfat cottage cheese.  Control Your Work Environment  Do not eat at [employer] or keep tempting snacks at your desk. If you get hungry between meals, plan healthy snacks and bring them with you to work. During your breaks, go for a walk instead of eating. If you work around food, plan in advance the one item you will eat at mealtime. Make it inconvenient to nibble on food by chewing gum, sugarless candy or drinking water or another low-calorie beverage. Do not work through meals. Skipping meals slows down metabolism and may result in overeating at the next meal. If food is available for special occasions, either pick the healthiest item, nibble on low-fat snacks brought from home, don't have anything offered, choose one option and have a small amount, or have only a beverage.  Control Your Mealtime  Environment  Serve your plate of food at the stove or kitchen counter. Do not put the serving dishes on the table. If you do put dishes on the table, remove them immediately when finished eating. Fill half of your plate with vegetables, a quarter with lean protein and a quarter with starch. Use smaller plates, bowls and glasses. A smaller portion will look large when it is in a little dish. Politely refuse second helpings. When fixing your plate, limit portions of food to one scoop/serving or less.   Daily Food Management  Replace eating with another activity that you will not associate with food. Wait 20 minutes before eating something you are craving. Drink a large glass of water or diet soda before eating. Always have a big glass or bottle of water to drink throughout the day. Avoid high-calorie add-ons such as cream with your coffee, butter, mayonnaise and salad dressings.  Shopping: Do not shop when hungry or tired. Shop from a list and avoid buying anything that is not on your list. If you must have tempting foods, buy individual-sized packages and try to find a lower-calorie alternative. Don't taste test in the store. Read food labels. Compare products to help you make the healthiest choices.  Preparation: Chew a piece of gum while cooking meals. Use a quarter teaspoon if you taste test your food. Try to only fix what you are going to eat, leaving yourself no chance for seconds. If you have prepared more food than you need, portion it into individual containers and freeze or refrigerate immediately. Don't snack while cooking meals.  Eating: Eat slowly. Remember it  takes about 20 minutes for your stomach to send a message to your brain that it is full. Don't let fake hunger make you think you need more. The ideal way to eat is to take a bite, put your utensil down, take a sip of water, cut your next bite, take a bit, put your utensil down and so on. Do not cut your food all at  one time. Cut only as needed. Take small bites and chew your food well. Stop eating for a minute or two at least once during a meal or snack. Take breaks to reflect and have conversation.  Cleanup and Leftovers: Label leftovers for a specific meal or snack. Freeze or refrigerate individual portions of leftovers. Do not clean up if you are still hungry.  Eating Out and Social Eating  Do not arrive hungry. Eat something light before the meal. Try to fill up on low-calorie foods, such as vegetables and fruit, and eat smaller portions of the high-calorie foods. Eat foods that you like, but choose small portions. If you want seconds, wait at least 20 minutes after you have eaten to see if you are actually hungry or if your eyes are bigger than your stomach. Limit alcoholic beverages. Try a soda water with a twist of lime. Do not skip other meals in the day to save room for the special event.  At Restaurants: Order  la carte rather than buffet style. Order some vegetables or a salad for an appetizer instead of eating bread. If you order a high-calorie dish, share it with someone. Try an after-dinner mint with your coffee. If you do have dessert, share it with two or more people. Don't overeat because you do not want to waste food. Ask for a doggie bag to take extra food home. Tell the server to put half of your entree in a to go bag before the meal is served to you. Ask for salad dressing, gravy or high-fat sauces on the side. Dip the tip of your fork in the dressing before each bite. If bread is served, ask for only one piece. Try it plain without butter or oil. At [employer] where oil and vinegar is served with bread, use only a small amount of oil and a lot of vinegar for dipping.  At a Friend's House: Offer to bring a dish, appetizer or dessert that is low in calories. Serve yourself small portions or tell the host that you only want a small amount. Stand or sit away from the  snack table. Stay away from the kitchen or stay busy if you are near the food. Limit your alcohol intake.  At [employer] and Cafeterias: Cover most of your plate with lettuce and/or vegetables. Use a salad plate instead of a dinner plate. After eating, clear away your dishes before having coffee or tea.  Entertaining at Home: Explore low-fat, low-cholesterol cookbooks. Use single-serving foods like chicken breasts or hamburger patties. Prepare low-calorie appetizers and desserts.   Holidays: Keep tempting foods out of sight. Decorate the house without using food. Have low-calorie beverages and foods on hand for guests. Allow yourself one planned treat a day. Don't skip meals to save up for the holiday feast. Eat regular, planned meals.   Exercise Well  Make exercise a priority and a planned activity in the day. If possible, walk the entire or part of the distance to work. Get an exercise buddy. Go for a walk with a colleague during one of your breaks, go to  the gym, run or take a walk with a friend, walk in the mall with a shopping companion. Park at the end of the parking lot and walk to the store or office entrance. Always take the stairs all of the way or at least part of the way to your floor. If you have a desk job, walk around the office frequently. Do leg lifts while sitting at your desk. Do something outside on the weekends like going for a hike or a bike ride.   Have a Healthy Attitude  Make health your weight management priority. Be realistic. Have a goal to achieve a healthier you, not necessarily the lowest weight or ideal weight based on calculations or tables. Focus on a healthy eating style, not on dieting. Dieting usually lasts for a short amount of time and rarely produces long-term success. Think long term. You are developing new healthy behaviors to follow next month, in a year and in a decade.    This information is for educational purposes only and is not  intended to replace the advice of your doctor or health care provider. We encourage you to discuss with your doctor any questions or concerns you may have.        Guidelines for Losing Weight   We want weight loss that will last so you should lose 1-2 pounds a week.  THAT IS IT! Please pick THREE things a month to change. Once it is a habit check off the item. Then pick another three items off the list to become habits.  If you are already doing a habit on the list GREAT!  Cross that item off!  Don't drink your calories. Ie, alcohol, soda, fruit juice, and sweet tea.   Drink more water. Drink a glass when you feel hungry or before each meal.   Eat breakfast - Complex carb and protein (likeDannon light and fit yogurt, oatmeal, fruit, eggs, [place] bacon).  Measure your cereal.  Eat no more than one cup a day. (ie Kashi)  Eat an apple a day.  Add a vegetable a day.  Try a new vegetable a month.  Use Pam! Stop using oil or butter to cook.  Don't finish your plate or use smaller plates.  Share your dessert.  Eat sugar free Jello for dessert or frozen grapes.  Don't eat 2-3 hours before bed.  Switch to whole wheat bread, pasta, and brown rice.  Make healthier choices when you eat out. No fries!  Pick baked chicken, NOT fried.  Don't forget to SLOW DOWN when you eat. It is not going anywhere.   Take the stairs.  Park far away in the parking lot  Lift soup cans (or weights) for 10 minutes while watching TV.  Walk at work for 10 minutes during break.  Walk outside 1 time a week with your friend, kids, dog, or significant other.  Start a walking group at church.  Walk the mall as much as you can tolerate.   Keep a food diary.  Weigh yourself daily.  Walk for 15 minutes 3 days per week.  Cook at home more often and eat out less. If life happens and you go back to old habits, it is okay.  Just start over. You can do it!  If you experience chest pain, get short  of breath, or tired during the exercise, please stop immediately and inform your doctor.    Before you even begin to attack a weight-loss plan, it pays to remember  this: You are not fat. You have fat. Losing weight isn't about blame or shame; it's simply another achievement to accomplish. Dieting is like any other skill-you have to buckle down and work at it. As long as you act in a smart, reasonable way, you'll ultimately get where you want to be. Here are some weight loss pearls for you.   1. It's Not a Diet. It's a Lifestyle Thinking of a diet as something you're on and suffering through only for the short term doesn't work. To shed weight and keep it off, you need to make permanent changes to the way you eat. It's OK to indulge occasionally, of course, but if you cut calories temporarily and then revert to your old way of eating, you'll gain back the weight quicker than you can say yo-yo. Use it to lose it. Research shows that one of the best predictors of long-term weight loss is how many pounds you drop in the first month. For that reason, nutritionists often suggest being stricter for the first two weeks of your new eating strategy to build momentum. Cut out added sugar and alcohol and avoid unrefined carbs. After that, figure out how you can reincorporate them in a way that's healthy and maintainable.  2. There's a Right Way to Exercise Working out burns calories and fat and boosts your metabolism by building muscle. But those trying to lose weight are notorious for overestimating the number of calories they burn and underestimating the amount they take in. Unfortunately, your system is biologically programmed to hold on to extra pounds and that means when you start exercising, your body senses the deficit and ramps up its hunger signals. If you're not diligent, you'll eat everything you burn and then some. Use it, to lose it. Cardio gets all the exercise glory, but strength and interval training  are the real heroes. They help you build lean muscle, which in turn increases your metabolism and calorie-burning ability 3. Don't Overreact to Mild Hunger Some people have a hard time losing weight because of hunger anxiety. To them, being hungry is bad-something to be avoided at all costs-so they carry snacks with them and eat when they don't need to. Others eat because they're stressed out or bored. While you never want to get to the point of being ravenous (that's when bingeing is likely to happen), a hunger pang, a craving, or the fact that it's 3:00 p.m. should not send you racing for the vending machine or obsessing about the energy bar in your purse. Ideally, you should put off eating until your stomach is growling and it's difficult to concentrate.  Use it to lose it. When you feel the urge to eat, use the HALT method. Ask yourself, Am I really hungry? Or am I angry or anxious, lonely or bored, or tired? If you're still not certain, try the apple test. If you're truly hungry, an apple should seem delicious; if it doesn't, something else is going on. Or you can try drinking water and making yourself busy, if you are still hungry try a healthy snack.  4. Not All Calories Are Created Equal The mechanics of weight loss are pretty simple: Take in fewer calories than you use for energy. But the kind of food you eat makes all the difference. Processed food that's high in saturated fat and refined starch or sugar can cause inflammation that disrupts the hormone signals that tell your brain you're full. The result: You eat a lot more.  Use it to lose it. Clean up your diet. Swap in whole, unprocessed foods, including vegetables, lean protein, and healthy fats that will fill you up and give you the biggest nutritional bang for your calorie buck. In a few weeks, as your brain starts receiving regular hunger and fullness signals once again, you'll notice that you feel less hungry overall and naturally start  cutting back on the amount you eat.  5. Protein, Produce, and Plant-Based Fats Are Your Weight-Loss Trinity Here's why eating the three Ps regularly will help you drop pounds. Protein fills you up. You need it to build lean muscle, which keeps your metabolism humming so that you can torch more fat. People in a weight-loss program who ate double the recommended daily allowance for protein (about 110 grams for a 150-pound woman) lost 70 percent of their weight from fat, while people who ate the RDA lost only about 40 percent, one study found. Produce is packed with filling fiber. "It's very difficult to consume too many calories if you're eating a lot of vegetables. Example: Three cups of broccoli is a lot of food, yet only 93 calories. (Fruit is another story. It can be easy to overeat and can contain a lot of calories from sugar, so be sure to monitor your intake.) Plant-based fats like olive oil and those in avocados and nuts are healthy and extra satiating.  Use it to lose it. Aim to incorporate each of the three Ps into every meal and snack. People who eat protein throughout the day are able to keep weight off, according to a study in the American Journal of Clinical Nutrition. In addition to meat, poultry and seafood, good sources are beans, lentils, eggs, tofu, and yogurt. As for fat, keep portion sizes in check by measuring out salad dressing, oil, and nut butters (shoot for one to two tablespoons). Finally, eat veggies or a little fruit at every meal. People who did that consumed 308 fewer calories but didn't feel any hungrier than when they didn't eat more produce.  7. How You Eat Is As Important As What You Eat In order for your brain to register that you're full, you need to focus on what you're eating. Sit down whenever you eat, preferably at a table. Turn off the TV or computer, put down your phone, and look at your food. Smell it. Chew slowly, and don't put another bite on your fork until you  swallow. When women ate lunch this attentively, they consumed 30 percent less when snacking later than those who listened to an audiobook at lunchtime, according to a study in the [place] Journal of Nutrition. 8. Weighing Yourself Really Works The scale provides the best evidence about whether your efforts are paying off. Seeing the numbers tick up or down or stagnate is motivation to keep going-or to rethink your approach. A 2015 study at [facility] found that daily weigh-ins helped people lose more weight, keep it off, and maintain that loss, even after two years. Use it to lose it. Step on the scale at the same time every day for the best results. If your weight shoots up several pounds from one weigh-in to the next, don't freak out. Eating a lot of salt the night before or having your period is the likely culprit. The number should return to normal in a day or two. It's a steady climb that you need to do something about. 9. Too Much Stress and Too Little Sleep Are Your Enemies When  you're tired and frazzled, your body cranks up the production of cortisol, the stress hormone that can cause carb cravings. Not getting enough sleep also boosts your levels of ghrelin, a hormone associated with hunger, while suppressing leptin, a hormone that signals fullness and satiety. People on a diet who slept only five and a half hours a night for two weeks lost 55 percent less fat and were hungrier than those who slept eight and a half hours, according to a study in the [place] [employer] Journal. Use it to lose it. Prioritize sleep, aiming for seven hours or more a night, which research shows helps lower stress. And make sure you're getting quality zzz's. If a snoring spouse or a fidgety cat wakes you up frequently throughout the night, you may end up getting the equivalent of just four hours of sleep, according to a study from [facility]. Keep pets out of the bedroom, and use a white-noise app  to drown out snoring. 10. You Will Hit a plateau-And You Can Bust Through It As you slim down, your body releases much less leptin, the fullness hormone.  If you're not strength training, start right now. Building muscle can raise your metabolism to help you overcome a plateau. To keep your body challenged and burning calories, incorporate new moves and more intense intervals into your workouts or add another sweat session to your weekly routine. Alternatively, cut an extra 100 calories or so a day from your diet. Now that you've lost weight, your body simply doesn't need as much fuel.    Since food equals calories, in order to lose weight you must either eat fewer calories, exercise more to burn off calories with activity, or both. Food that is not used to fuel the body is stored as fat. A major component of losing weight is to make smarter food choices. Here's how:  1)   Limit non-nutritious foods, such as: Sugar, honey, syrups and candy Pastries, donuts, pies, cakes and cookies Soft drinks, sweetened juices and alcoholic beverages  2)  Cut down on high-fat foods by: - Choosing poultry, fish or lean red meat - Choosing low-fat cooking methods, such as baking, broiling, steaming, grilling and boiling - Using low-fat or non-fat dairy products - Using vinaigrette, herbs, lemon or fat-free salad dressings - Avoiding fatty meats, such as bacon, sausage, franks, ribs and luncheon meats - Avoiding high-fat snacks like nuts, chips and chocolate - Avoiding fried foods - Using less butter, margarine, oil and mayonnaise - Avoiding high-fat gravies, cream sauces and cream-based soups  3) Eat a variety of foods, including: - Fruit and vegetables that are raw, steamed or baked - Whole grains, breads, cereal, rice and pasta - Dairy products, such as low-fat or non-fat milk or yogurt, low-fat cottage cheese and low-fat cheese - Protein-rich foods like chicken, [place], fish, lean meat and legumes, or  beans  4) Change your eating habits by: - Eat three balanced meals a day to help control your hunger - Watch portion sizes and eat small servings of a variety of foods - Choose low-calorie snacks - Eat only when you are hungry and stop when you are satisfied - Eat slowly and try not to perform other tasks while eating - Find other activities to distract you from food, such as walking, taking up a hobby or being involved in the community - Include regular exercise in your daily routine ( minimum of 20 min of moderate-intensity exercise at least 5 days/week)  -  Find a support group, if necessary, for emotional support in your weight loss journey           Easy ways to cut 100 calories   1. Eat your eggs with hot sauce OR salsa instead of cheese.  Eggs are great for breakfast, but many people consider eggs and cheese to be BFFs. Instead of cheese-1 oz. of cheddar has 114 calories-top your eggs with hot sauce, which contains no calories and helps with satiety and metabolism. Salsa is also a great option!!  2. Top your toast, waffles or pancakes with fresh berries instead of jelly or syrup. Half a cup of berries-fresh, frozen or thawed-has about 40 calories, compared with 2 tbsp. of maple syrup or jelly, which both have about 100 calories. The berries will also give you a good punch of fiber, which helps keep you full and satisfied and won't spike blood sugar quickly like the jelly or syrup. 3. Swap the non-fat latte for black coffee with a splash of half-and-half. Contrary to its name, that non-fat latte has 130 calories and a startling 19g of carbohydrates per 16 oz. serving. Replacing that 'light' drinkable dessert with a black coffee with a splash of half-and-half saves you more than 100 calories per 16 oz. serving. 4. Sprinkle salads with freeze-dried raspberries instead of dried cranberries. If you want a sweet addition to your nutritious salad, stay away from dried cranberries. They  have a whopping 130 calories per  cup and 30g carbohydrates. Instead, sprinkle freeze-dried raspberries guilt-free and save more than 100 calories per  cup serving, adding 3g of belly-filling fiber. 5. Go for mustard in place of mayo on your sandwich. Mustard can add really nice flavor to any sandwich, and there are tons of varieties, from spicy to honey. A serving of mayo is 95 calories, versus 10 calories in a serving of mustard.  Or try an avocado mayo spread: You can find the recipe few click this link: https://www.californiaavocado.com/recipes/recipe-container/[place]ifornia-avocado-mayo 6. Choose a DIY salad dressing instead of the store-bought kind. Mix Dijon or whole grain mustard with low-fat Kefir or red wine vinegar and garlic. 7. Use hummus as a spread instead of a dip. Use hummus as a spread on a high-fiber cracker or tortilla with a sandwich and save on calories without sacrificing taste. 8. Pick just one salad "accessory." Salad isn't automatically a calorie winner. It's easy to over-accessorize with toppings. Instead of topping your salad with nuts, avocado and cranberries (all three will clock in at 313 calories), just pick one. The next day, choose a different accessory, which will also keep your salad interesting. You don't wear all your jewelry every day, right? 9. Ditch the white pasta in favor of spaghetti squash. One cup of cooked spaghetti squash has about 40 calories, compared with traditional spaghetti, which comes with more than 200. Spaghetti squash is also nutrient-dense. It's a good source of fiber and Vitamins A and C, and it can be eaten just like you would eat pasta-with a great tomato sauce and [place] meatballs or with pesto, tofu and spinach, for example. 10. Dress up your chili, soups and stews with non-fat [place] yogurt instead of sour cream. Just a 'dollop' of sour cream can set you back 115 calories and a whopping 12g of fat-seven of which are of the artery-clogging  variety. Added bonus: [place] yogurt is packed with muscle-building protein, calcium and B Vitamins. 11. Mash cauliflower instead of mashed potatoes. One cup of traditional mashed potatoes-in all their creamy  goodness-has more than 200 calories, compared to mashed cauliflower, which you can typically eat for less than 100 calories per 1 cup serving. Cauliflower is a great source of the antioxidant indole-3-carbinol (I3C), which may help reduce the risk of some cancers, like breast cancer. 12. Ditch the ice cream sundae in favor of a [place] yogurt parfait. Instead of a cup of ice cream or fro-yo for dessert, try 1 cup of nonfat Greek yogurt topped with fresh berries and a sprinkle of cacao nibs. Both toppings are packed with antioxidants, which can help reduce cellular inflammation and oxidative damage. And the comparison is a no-brainer: One cup of ice cream has about 275 calories; one cup of frozen yogurt has about 230; and a cup of Greek yogurt has just 130, plus twice the protein, so you're less likely to return to the freezer for a second helping. 13. Put olive oil in a spray container instead of using it directly from the bottle. Each tablespoon of olive oil is 120 calories and 15g of fat. Use a mister instead of pouring it straight into the pan or onto a salad. This allows for portion control and will save you more than 100 calories. 14. When baking, substitute canned pumpkin for butter or oil. Canned pumpkin-not pumpkin pie mix-is loaded with Vitamin A, which is important for skin and eye health, as well as immunity. And the comparisons are pretty crazy:  cup of canned pumpkin has about 40 calories, compared to butter or oil, which has more than 800 calories. Yes, 800 calories. Applesauce and mashed banana can also serve as good substitutions for butter or oil, usually in a 1:1 ratio. 15. Top casseroles with high-fiber cereal instead of breadcrumbs. Breadcrumbs are typically made with white bread,  while breakfast cereals contain 5-9g of fiber per serving. Not only will you save more than 150 calories per  cup serving, the swap will also keep you more full and you'll get a metabolism boost from the added fiber. 16. Snack on pistachios instead of macadamia nuts. Believe it or not, you get the same amount of calories from 35 pistachios (100 calories) as you would from only five macadamia nuts. 17. Chow down on kale chips rather than potato chips. This is my favorite 'don't knock it 'till you try it' swap. Kale chips are so easy to make at home, and you can spice them up with a little grated parmesan or chili powder. Plus, they're a mere fraction of the calories of potato chips, but with the same crunch factor we crave so often. 18. Add seltzer and some fruit slices to your cocktail instead of soda or fruit juice. One cup of soda or fruit juice can pack on as much as 140 calories. Instead, use seltzer and fruit slices. The fruit provides valuable phytochemicals, such as flavonoids and anthocyanins, which help to combat cancer and stave off the aging process.

## 2018-03-24 NOTE — Progress Notes (Signed)
Impression and Recommendations:    1. Hospital discharge follow-up   2. Fever, unspecified fever cause   3. Cellulitis of lower extremity, unspecified laterality   4. Type 2 diabetes mellitus with complication, without long-term current use of insulin ([place])   5. Morbid obesity BMI >er 48   6. Chronic venous stasis dermatitis of both lower extremities   7. Chronic neuropathy of both feet-  due to lumbar radiculopathy as well as diabetic neuropathy     Hospital Followup -Pt stable, afebrile, cellulitis is gone, as he is finished both courses of his 2 antibiotics.  Pt back to baseline without complaints today.   Weight Loss -Pt set goal of 300 lbs by January 1 -Encouraged pt to consider tracking food in order to become more aware of negative eating habits. -Discussed apps for diet tracking such as LoseIt app for further support of dietary changes -When he follow-up sooner than later ( every month if desired )  if he would like assistance with weight loss.   Bilateral lower extremity edema  -Encouraged pt to elevate legs regularly to help decrease swelling of lower extremities -Discussed use of compression stockings in order to help decrease edema in his bilateral lower extremities. -Recommend exercise to improve venous return to the heart as well   Diabetic foot Care with peripheral neuropathy -Educated pt on importance of regular diabetic foot care to prevent injury and possible infection-Per patient he has never been to podiatrist and since he has hammertoes, many calluses etc., we will refer him to podiatry at this time.  DM eye care - has never gone for a diabetic retinopathy eye exam per patient. -Importance of compliance with these necessary screenings discussed with patient. -He expresses concerns of cost and states this is the reason why he never went in the past.    Orders Placed This Encounter  Procedures  . Ambulatory referral to Podiatry  . Ambulatory  referral to Ophthalmology    Please see AVS handed out to patient at the end of our visit for further patient instructions/ counseling done pertaining to today's office visit.   Return for keep routine f/up OV foir Chronic conditions near future.    Note:  This note was prepared with assistance of Dragon voice recognition software. Occasional wrong-word or sound-a-like substitutions may have occurred due to the inherent limitations of voice recognition software.   I have reviewed the above documentation for accuracy and completeness, and I agree with the above.    This document serves as a record of services personally performed by [name], MD. It was created on her behalf by Georga Bora, a trained medical scribe. The creation of this record is based on the scribe's personal observations and the provider's statements to them.   I have reviewed the above medical documentation for accuracy and completeness and I concur.  [name] 03/25/18 1:09 PM   --------------------------------------------------------------------------------------------------------------------------------------------------------------------------------------------------------------------------------    Subjective:     HPI: Chistopher D [name] is a 62 y.o. male who presents to [place] [facility] [facility] today for followup after hospital visit   -Pt states he went to the ER and was held for 2-3 days; we were able to view admission note from the ED, as well as rad reports- CXR, head CT and Lab results etc -Had a "screaming headache with pulsating pain right behind the eyes" and a fever of 104 at home.  -They attempted a lumbar puncture; failed to due "prevalent  scar tissue" -per pt: [name] determined likelycause of headache and fever was cellulitis and patient was discharged home on antibiotics. -Stated right LE was more swollen that usual with a "red patch of skin".  -There is no  discharge documentation/ Discharge note in pt's chart, and he does not have any discharge documentation with him today.  So unfortunately history is taken from patient today on exactly what was done during hospitalization/ upon D/c from hosp. -Denies headache/ facial pain, fevers, or any negative SE from medications today in office -States his peripheral edema in his bilateral lower extremities is essentially the same as his chronic state.     Weight Loss -Pt stated he has lost 6 lbs since last appointment with Dr. Raliegh Scarlet -He has joined a [employer] class to increase his level of activity and find new ways to move more -States he wants to be at 300 lbs by January 2019 -Pt says he "understands it took years to get this way so it will take time to change" but is determined to make changes - Has not made any dietary changes yet   Wt Readings from Last 3 Encounters:  03/24/18 (!) 340 lb (154.2 kg)  03/17/18 (!) 353 lb 9.9 oz (160.4 kg)  01/09/18 (!) 342 lb 9.6 oz (155.4 kg)   BP Readings from Last 3 Encounters:  03/24/18 110/72  03/17/18 115/78  01/09/18 110/71   Pulse Readings from Last 3 Encounters:  03/24/18 86  03/17/18 63  01/09/18 62   BMI Readings from Last 3 Encounters:  03/24/18 44.86 kg/m  03/17/18 46.65 kg/m  01/09/18 45.20 kg/m     Patient Care Team    Relationship Specialty Notifications Start End  [name], DO PCP - General Family Medicine  02/20/16   Artist Pais, MD  Orthopedic Surgery  04/11/16   Dian Situ, MD  Pain Medicine  04/11/16   Tanda Rockers, MD Consulting Physician Pulmonary Disease  04/11/16    Comment: Obstructive sleep apnea, restrictive lung disease due to severe morbid obesity  Awilda Metro, PA-C  Pain Medicine  07/23/17      Patient Active Problem List   Diagnosis Date Noted  . Type 2 diabetes mellitus with complication, without long-term current use of insulin ([place]) 09/30/2017    Priority: High  . Hypertension  associated with diabetes ([place]) 09/30/2017    Priority: High  . Mixed diabetic hyperlipidemia associated with type 2 diabetes mellitus ([place]) 09/30/2017    Priority: High  . Vitamin D deficiency 05/08/2016    Priority: High  . Bilateral lower extremity pitting edema 04/11/2016    Priority: High  . Restrictive lung disease secondary to obesity 04/11/2016    Priority: High  . Morbid obesity BMI >er 48 02/15/2012    Priority: High  . 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 Chronic venous stasis dermatitis of both lower extremities 05/08/2016    Priority: Medium  . OSA on CPAP- seen Dr Melvyn Novas in past 04/11/2016    Priority: Medium  . Physical deconditioning 04/11/2016    Priority: Medium  . h/o Depressive disorder 04/11/2016    Priority: Medium  . History of tobacco use 04/11/2016    Priority: Low  . Postlaminectomy syndrome, lumbar region 01/04/2012    Priority: Low  . h/o Narcotic abuse 01/04/2012    Priority: Low  . Constipation 09/22/2011    Priority: Low  .  Chronic back pain 09/22/2011    Priority: Low  . Fever 03/15/2018  . Headache 03/15/2018  . Anemia 03/15/2018  . Inactivity 01/09/2018  . Generalized osteoarthritis 01/09/2018  . Contact dermatitis and eczema 01/09/2018  . Arthritis of carpometacarpal [facility]) joint of left thumb 07/02/2017  . Bilateral leg cramps 05/09/2017  . acute Left heel pain 03/28/2017  . Tick bite 02/11/2017  . Barrett's esophagus 12/07/2016  . Chronic pain of left heel 11/27/2016  . Lumbar radiculopathy 07/21/2012  . Chronic pain due to injury 07/21/2012  . Back pain 01/04/2012  . Dyspnea 11/27/2011  . Confusion 09/22/2011  . Abdominal pain 09/22/2011  . Dehydration 09/22/2011  . Nausea & vomiting 09/22/2011    Past Medical history, Surgical history, Family history, Social history, Allergies and Medications have been entered into the medical record,  reviewed and changed as needed.    Current Meds  Medication Sig  . albuterol (PROVENTIL HFA;VENTOLIN HFA) 108 (90 Base) MCG/ACT inhaler Inhale 2 puffs into the lungs every 6 (six) hours as needed for wheezing or shortness of breath.  . APPLE CIDER VINEGAR PO Take by mouth.  Marland Kitchen atorvastatin (LIPITOR) 20 MG tablet TAKE 1 TABLET BY MOUTH EVERYDAY AT BEDTIME  . B Complex Vitamins (B COMPLEX 100 PO) Take 1 capsule by mouth every 3 (three) days.   . blood glucose meter kit and supplies KIT Dispense based on patient and insurance preference. Use up to four times daily as directed. (FOR ICD-9 250.00, 250.01).  . blood glucose meter kit and supplies Dispense based on patient and insurance preference. Use to check glucose fasting in the AM and then after largest meal of the day.. (FOR ICD-10 E10.9, E11.9).  Marland Kitchen Blood Glucose Monitoring Suppl (ONE TOUCH ULTRA MINI) w/Device KIT Use to test blood sugar, test in the morning fasting and test after largest meal of the day.  . butalbital-acetaminophen-caffeine (FIORICET, ESGIC) 50-325-40 MG tablet Take 1 tablet by mouth every 6 (six) hours as needed for headache.  . [EXPIRED] cephALEXin (KEFLEX) 500 MG capsule Take 1 capsule (500 mg total) by mouth every 12 (twelve) hours for 7 days.  . Cholecalciferol (VITAMIN D3) 5000 units TABS 5,000 IU OTC vitamin D3 daily. (Patient taking differently: Take 5,000 Units by mouth daily. )  . cyclobenzaprine (FLEXERIL) 10 MG tablet TAKE 1 TABLET BY MOUTH TWICE A DAY (Patient taking differently: Take 10 mg by mouth 3 (three) times daily as needed for muscle spasms. )  . [EXPIRED] doxycycline (VIBRA-TABS) 100 MG tablet Take 1 tablet (100 mg total) by mouth 2 (two) times daily for 7 days.  Marland Kitchen glucose blood (ONE TOUCH ULTRA TEST) test strip Use to test blood sugar, test in the morning fasting and test after largest meal of the day.  . hydrochlorothiazide (MICROZIDE) 12.5 MG capsule Take 1 capsule (12.5 mg total) by mouth daily.  .  meloxicam (MOBIC) 15 MG tablet Take 1 tablet (15 mg total) by mouth daily. (Patient taking differently: Take 15 mg by mouth daily as needed for pain. )  . metFORMIN (GLUCOPHAGE) 500 MG tablet Take 2 tablets (1,000 mg total) by mouth 2 (two) times daily with a meal.  . naphazoline-pheniramine (NAPHCON-A) 0.025-0.3 % ophthalmic solution Place 1 drop into both eyes 4 (four) times daily as needed for eye irritation.  Glory Rosebush DELICA LANCETS 83T MISC Use for testing blood sugar, test once in the morning fasting and test after largest meal of the day.  . Potassium 75 MG TABS Take 150 mg  by mouth every other day.   . traMADol (ULTRAM) 50 MG tablet Take 100 mg by mouth 3 (three) times daily. For pain  . Triamcinolone Acetonide (TRIAMCINOLONE 0.1 % CREAM : EUCERIN) CREA Apply 1 application topically 2 (two) times daily.  . vitamin C (ASCORBIC ACID) 500 MG tablet Take 500 mg by mouth daily.  . Vitamin D, Ergocalciferol, (DRISDOL) 50000 units CAPS capsule TAKE 1 CAPSULE WEEKLY (Patient taking differently: Take 50,000 Units by mouth every Friday. )  . VITAMIN E PO Take 1 tablet by mouth daily.  Marland Kitchen zinc gluconate 50 MG tablet Take 50 mg by mouth daily.    Allergies:  No Known Allergies   Review of Systems:  A fourteen system review of systems was performed and found to be positive as per HPI.   Objective:   Blood pressure 110/72, pulse 86, height _0  (1.854 m), weight (!) 340 lb (154.2 kg). Body mass index is 44.86 kg/m. General:  Well Developed, well nourished, appropriate for stated age.  Neuro:  Alert and oriented,  extra-ocular muscles intact  HEENT:  Normocephalic, atraumatic, neck supple, no carotid bruits appreciated  Skin:  no gross rash, warm, pink. Cardiac:  RRR, S1 S2 Respiratory:  ECTA B/L and A/P, Not using accessory muscles, speaking in full sentences- unlabored. Vascular:  Ext warm, no cyanosis apprec.; cap RF less 2 sec. Psych:  No HI/SI, judgement and insight good, Euthymic  mood. Full Affect.

## 2018-03-25 DIAGNOSIS — G5793 Unspecified mononeuropathy of bilateral lower limbs: Secondary | ICD-10-CM | POA: Insufficient documentation

## 2018-03-27 NOTE — Discharge Summary (Signed)
[facility] Discharge Summary   Patient: Robert [name] ZOX:[id number]   PCP: [name], DO DOB: May 27, 1956   Date of admission: 03/14/2018   Date of discharge: 03/17/2018    Discharge Diagnoses:  Principal Problem:   Fever Active Problems:   Type 2 diabetes mellitus with complication, without long-term current use of insulin ([place])   Headache   Anemia   Admitted From: home Disposition:  home  Recommendations for Outpatient Follow-up:  1. Please follow up with PCP in 1 week   Follow-up Information    [name], DO. Schedule an appointment as soon as possible for a visit in 1 week(s).   Specialty:  Family Medicine Contact information: [place] [address] [phone]          Diet recommendation: cardiac die  Activity: The patient is advised to gradually reintroduce usual activities.  Discharge Condition: good  Code Status: full code  History of present illness: As per the H and P dictated on admission, "Robert [name]  is a 62 y.o. male, w Jerrye Bushy, Barretts Esophagus, OSA (on cpap), apparently presents with headache starting 7am. Frontal with slight photophobia, and nausea and neck stiffness. Pt denies tick bite.  + fever for the past 24 hours,  T 104 at home. Pt concerned about fever and therefore presented to ED, "  Hospital Course:  Summary of his active problems in the hospital is as following. 1.  Right leg cellulitis. Patient has redness as well as warmth involving the right leg along with some rash. Patient did sustain some injury on his right second toe 2 weeks ago. X-ray foot as well as x-ray tibia-fibula were performed, no acute abnormality other than soft tissue swelling. ESR normal CRP significantly elevated. Patient was febrile yesterday no fever this morning. Initially there was some concern for possible meningitis with patient's complaint of photophobia although discussed with ID and on my evaluation I do not feel that  the patient actually has any evidence of meningitis with no evidence of neck stiffness or rigidity and alternate explanation of foot cellulitis clearly visible. Continue to monitor blood cultures. Antibiotic changed from IV vancomycin, Rocephin, ampicillin and doxycycline to IV ceftriaxone to keflex to cover for strep cellulitis and continuing doxycycline to cover for staph, MRSA as well as concern for RMSF. Titers are still pending.  2.  Headache. This is likely in response to fever as well as noncompliance with OSA CPAP. With Fioricet headache is much better.  CT head unremarkable. No focal deficit again. PRN Norco for now.  3.  Essential hypertension. Blood [pressure has been stable without any medication  Will stop the meds on discharge. Add HCTZ for edema  4.  OSA. Continue CPAP at night.  5.  Type 2 diabetes mellitus.  Possibly neuropathy with long-term complication.  Not using insulin  All other chronic medical condition were stable during the hospitalization.  Patient was seen by physical therapy, who recommended home health, which was arranged by Education officer, museum and case Freight forwarder. On the day of the discharge the patient's vitals were stable , and no other acute medical condition were reported by patient. the patient was felt safe to be discharge at home with home health.  Consultants: none Procedures: none  DISCHARGE MEDICATION: Allergies as of 03/17/2018   No Known Allergies     Medication List    STOP taking these medications   ibuprofen 200 MG tablet Commonly known as:  ADVIL,MOTRIN   Olmesartan-amLODIPine-HCTZ 20-5-12.5 MG Tabs  TAKE these medications   albuterol 108 (90 Base) MCG/ACT inhaler Commonly known as:  PROVENTIL HFA;VENTOLIN HFA Inhale 2 puffs into the lungs every 6 (six) hours as needed for wheezing or shortness of breath.   APPLE CIDER VINEGAR PO Take by mouth.   atorvastatin 20 MG tablet Commonly known as:  LIPITOR TAKE 1 TABLET BY MOUTH  EVERYDAY AT BEDTIME   B COMPLEX 100 PO Take 1 capsule by mouth every 3 (three) days.   blood glucose meter kit and supplies Dispense based on patient and insurance preference. Use to check glucose fasting in the AM and then after largest meal of the day.. (FOR ICD-10 E10.9, E11.9).   blood glucose meter kit and supplies Kit Dispense based on patient and insurance preference. Use up to four times daily as directed. (FOR ICD-9 250.00, 250.01).   butalbital-acetaminophen-caffeine 50-325-40 MG tablet Commonly known as:  FIORICET, ESGIC Take 1 tablet by mouth every 6 (six) hours as needed for headache.   cyclobenzaprine 10 MG tablet Commonly known as:  FLEXERIL TAKE 1 TABLET BY MOUTH TWICE A DAY What changed:    when to take this  reasons to take this   glucose blood test strip Use to test blood sugar, test in the morning fasting and test after largest meal of the day.   hydrochlorothiazide 12.5 MG capsule Commonly known as:  MICROZIDE Take 1 capsule (12.5 mg total) by mouth daily.   meloxicam 15 MG tablet Commonly known as:  MOBIC Take 1 tablet (15 mg total) by mouth daily. What changed:    when to take this  reasons to take this   metFORMIN 500 MG tablet Commonly known as:  GLUCOPHAGE Take 2 tablets (1,000 mg total) by mouth 2 (two) times daily with a meal.   naphazoline-pheniramine 0.025-0.3 % ophthalmic solution Commonly known as:  NAPHCON-A Place 1 drop into both eyes 4 (four) times daily as needed for eye irritation.   ONE TOUCH ULTRA MINI w/Device Kit Use to test blood sugar, test in the morning fasting and test after largest meal of the day.   ONETOUCH DELICA LANCETS 30Q Misc Use for testing blood sugar, test once in the morning fasting and test after largest meal of the day.   Potassium 75 MG Tabs Take 150 mg by mouth every other day.   traMADol 50 MG tablet Commonly known as:  ULTRAM Take 100 mg by mouth 3 (three) times daily. For pain   triamcinolone  0.1 % cream : eucerin Crea Apply 1 application topically 2 (two) times daily.   vitamin C 500 MG tablet Commonly known as:  ASCORBIC ACID Take 500 mg by mouth daily.   Vitamin D (Ergocalciferol) 50000 units Caps capsule Commonly known as:  DRISDOL TAKE 1 CAPSULE WEEKLY What changed:  when to take this   Vitamin D3 5000 units Tabs 5,000 IU OTC vitamin D3 daily. What changed:    how much to take  how to take this  when to take this  additional instructions   VITAMIN E PO Take 1 tablet by mouth daily.   zinc gluconate 50 MG tablet Take 50 mg by mouth daily.     ASK your doctor about these medications   cephALEXin 500 MG capsule Commonly known as:  KEFLEX Take 1 capsule (500 mg total) by mouth every 12 (twelve) hours for 7 days. Ask about: Should I take this medication?   doxycycline 100 MG tablet Commonly known as:  VIBRA-TABS Take 1 tablet (100 mg  total) by mouth 2 (two) times daily for 7 days. Ask about: Should I take this medication?      No Known Allergies Discharge Instructions    Diet - low sodium heart healthy   Complete by:  As directed    Discharge instructions   Complete by:  As directed    It is important that you read following instructions as well as go over your medication list with RN to help you understand your care after this hospitalization.  Discharge Instructions: Please follow-up with PCP in one week  Please request your primary care physician to go over all Hospital Tests and Procedure/Radiological results at the follow up,  Please get all Hospital records sent to your PCP by signing hospital release before you go home.   Do not take more than prescribed Pain, Sleep and Anxiety Medications. You were cared for by a hospitalist during your hospital stay. If you have any questions about your discharge medications or the care you received while you were in the hospital after you are discharged, you can call the unit and ask to speak with the  hospitalist on call if the hospitalist that took care of you is not available.  Once you are discharged, your primary care physician will handle any further medical issues. Please note that NO REFILLS for any discharge medications will be authorized once you are discharged, as it is imperative that you return to your primary care physician (or establish a relationship with a primary care physician if you do not have one) for your aftercare needs so that they can reassess your need for medications and monitor your lab values. You Must read complete instructions/literature along with all the possible adverse reactions/side effects for all the Medicines you take and that have been prescribed to you. Take any new Medicines after you have completely understood and accept all the possible adverse reactions/side effects. Wear Seat belts while driving. If you have smoked or chewed Tobacco in the last 2 yrs please stop smoking and/or stop any Recreational drug use.   Increase activity slowly   Complete by:  As directed      Discharge Exam: Filed Weights   03/15/18 1509 03/16/18 0441 03/17/18 0515  Weight: (!) 164.4 kg (!) 164 kg (!) 160.4 kg   Vitals:   03/17/18 0515 03/17/18 1212  BP: 95/61 115/78  Pulse: 68 63  Resp: 18 18  Temp: 97.9 F (36.6 C) 98.5 F (36.9 C)  SpO2: 95% 100%   General: Appear in no distress, no Rash; Oral Mucosa moist. Cardiovascular: S1 and S2 Present, no Murmur, no JVD Respiratory: Bilateral Air entry present and Clear to Auscultation, no Crackles, no wheezes Abdomen: Bowel Sound present, Soft and no tenderness Extremities: bilateral  Pedal edema, no calf tenderness Neurology: Grossly no focal neuro deficit.  The results of significant diagnostics from this hospitalization (including imaging, microbiology, ancillary and laboratory) are listed below for reference.    Significant Diagnostic Studies: Dg Tibia/fibula Right  Result Date: 03/15/2018 CLINICAL DATA:   Cellulitis. EXAM: RIGHT TIBIA AND FIBULA - 2 VIEW COMPARISON:  None. FINDINGS: There is no evidence of fracture or other focal bone lesions. Tricompartmental degenerative changes of the knee. Mild soft tissue swelling of the lower leg. IMPRESSION: Soft tissue swelling.  No acute osseous abnormality. Electronically Signed   By: Titus Dubin M.D.   On: 03/15/2018 10:21   Ct Head Wo Contrast  Result Date: 03/15/2018 CLINICAL DATA:  Frontal headache since yesterday.  Normal  neuro exam EXAM: CT HEAD WITHOUT CONTRAST TECHNIQUE: Contiguous axial images were obtained from the base of the skull through the vertex without intravenous contrast. COMPARISON:  09/22/2011 FINDINGS: Brain: No evidence of acute infarction, hemorrhage, hydrocephalus, extra-axial collection or mass lesion/mass effect. Vascular: No hyperdense vessel or unexpected calcification. Skull: Normal. Negative for fracture or focal lesion. Sinuses/Orbits: No acute finding. IMPRESSION: No acute finding or explanation for headache. Electronically Signed   By: Monte Fantasia M.D.   On: 03/15/2018 10:06   Dg Chest Port 1 View  Result Date: 03/15/2018 CLINICAL DATA:  Fever nausea beginning this morning. History of asthma. EXAM: PORTABLE CHEST 1 VIEW COMPARISON:  Chest radiograph September 27, 2015 FINDINGS: Cardiomediastinal silhouette is normal. No pleural effusions or focal consolidations. Trachea projects midline and there is no pneumothorax. Soft tissue planes and included osseous structures are non-suspicious. Old LEFT clavicle fracture. IMPRESSION: No active disease. Electronically Signed   By: Elon Alas M.D.   On: 03/15/2018 00:48   Dg Foot Complete Right  Result Date: 03/15/2018 CLINICAL DATA:  Infected foot abrasion EXAM: RIGHT FOOT COMPLETE - 3+ VIEW COMPARISON:  05/10/2009 FINDINGS: No opaque foreign body, fracture, or erosion. No definite soft tissue gas, and definitely none seen tracking along fascial planes. Severe first TMT  osteoarthritis with progression from prior. IMPRESSION: 1. No opaque foreign body or acute osseous finding. 2. Severe first TMT osteoarthritis. Electronically Signed   By: Monte Fantasia M.D.   On: 03/15/2018 10:20  Time spent: 35 minutes  Signed:  Berle Mull  [facility] 03/17/2018 , 8:15 AM

## 2018-04-02 DIAGNOSIS — M9905 Segmental and somatic dysfunction of pelvic region: Secondary | ICD-10-CM | POA: Diagnosis not present

## 2018-04-02 DIAGNOSIS — M5116 Intervertebral disc disorders with radiculopathy, lumbar region: Secondary | ICD-10-CM | POA: Diagnosis not present

## 2018-04-02 DIAGNOSIS — M461 Sacroiliitis, not elsewhere classified: Secondary | ICD-10-CM | POA: Diagnosis not present

## 2018-04-02 DIAGNOSIS — M9904 Segmental and somatic dysfunction of sacral region: Secondary | ICD-10-CM | POA: Diagnosis not present

## 2018-04-02 DIAGNOSIS — M542 Cervicalgia: Secondary | ICD-10-CM | POA: Diagnosis not present

## 2018-04-02 DIAGNOSIS — M9901 Segmental and somatic dysfunction of cervical region: Secondary | ICD-10-CM | POA: Diagnosis not present

## 2018-04-06 ENCOUNTER — Emergency Department (HOSPITAL_COMMUNITY): Payer: Medicare Other

## 2018-04-06 ENCOUNTER — Other Ambulatory Visit: Payer: Self-pay

## 2018-04-06 ENCOUNTER — Encounter (HOSPITAL_COMMUNITY): Payer: Self-pay | Admitting: Emergency Medicine

## 2018-04-06 DIAGNOSIS — M868X7 Other osteomyelitis, ankle and foot: Secondary | ICD-10-CM | POA: Diagnosis not present

## 2018-04-06 DIAGNOSIS — Z6841 Body Mass Index (BMI) 40.0 and over, adult: Secondary | ICD-10-CM | POA: Diagnosis not present

## 2018-04-06 DIAGNOSIS — Z23 Encounter for immunization: Secondary | ICD-10-CM | POA: Diagnosis not present

## 2018-04-06 DIAGNOSIS — A419 Sepsis, unspecified organism: Secondary | ICD-10-CM | POA: Diagnosis not present

## 2018-04-06 DIAGNOSIS — D61818 Other pancytopenia: Secondary | ICD-10-CM | POA: Diagnosis not present

## 2018-04-06 DIAGNOSIS — L03115 Cellulitis of right lower limb: Secondary | ICD-10-CM | POA: Diagnosis not present

## 2018-04-06 DIAGNOSIS — Z5321 Procedure and treatment not carried out due to patient leaving prior to being seen by health care provider: Secondary | ICD-10-CM | POA: Insufficient documentation

## 2018-04-06 DIAGNOSIS — R509 Fever, unspecified: Secondary | ICD-10-CM | POA: Diagnosis not present

## 2018-04-06 NOTE — ED Triage Notes (Addendum)
Pt c/o sore throat and fever since today. Pt states he treated fever with advil and tylenol today. Pt also c/o nausea. Pt states he is having leg pain in RLE similar to his prior dx of R leg cellulitis.

## 2018-04-07 ENCOUNTER — Ambulatory Visit (INDEPENDENT_AMBULATORY_CARE_PROVIDER_SITE_OTHER): Payer: Medicare Other | Admitting: Family Medicine

## 2018-04-07 ENCOUNTER — Emergency Department (HOSPITAL_COMMUNITY)
Admission: EM | Admit: 2018-04-07 | Discharge: 2018-04-07 | Discharge status: Against Medical Advice | Payer: Medicare Other | Source: Home / Self Care

## 2018-04-07 ENCOUNTER — Other Ambulatory Visit: Payer: Self-pay

## 2018-04-07 ENCOUNTER — Inpatient Hospital Stay (HOSPITAL_BASED_OUTPATIENT_CLINIC_OR_DEPARTMENT_OTHER)
Admission: EM | Admit: 2018-04-07 | Discharge: 2018-04-10 | DRG: 872 | Disposition: A | Discharge status: Home / Self Care | Payer: Medicare Other | Attending: Internal Medicine | Admitting: Internal Medicine

## 2018-04-07 ENCOUNTER — Encounter (HOSPITAL_BASED_OUTPATIENT_CLINIC_OR_DEPARTMENT_OTHER): Payer: Self-pay | Admitting: *Deleted

## 2018-04-07 ENCOUNTER — Encounter: Payer: Self-pay | Admitting: Family Medicine

## 2018-04-07 VITALS — BP 109/58 | HR 99 | Temp 98.7°F | Ht 73.0 in | Wt 340.0 lb

## 2018-04-07 DIAGNOSIS — Z79899 Other long term (current) drug therapy: Secondary | ICD-10-CM | POA: Diagnosis not present

## 2018-04-07 DIAGNOSIS — D649 Anemia, unspecified: Secondary | ICD-10-CM | POA: Diagnosis present

## 2018-04-07 DIAGNOSIS — L0390 Cellulitis, unspecified: Secondary | ICD-10-CM | POA: Diagnosis not present

## 2018-04-07 DIAGNOSIS — E118 Type 2 diabetes mellitus with unspecified complications: Secondary | ICD-10-CM

## 2018-04-07 DIAGNOSIS — E1140 Type 2 diabetes mellitus with diabetic neuropathy, unspecified: Secondary | ICD-10-CM | POA: Diagnosis present

## 2018-04-07 DIAGNOSIS — I878 Other specified disorders of veins: Secondary | ICD-10-CM | POA: Diagnosis present

## 2018-04-07 DIAGNOSIS — Z9989 Dependence on other enabling machines and devices: Secondary | ICD-10-CM | POA: Diagnosis not present

## 2018-04-07 DIAGNOSIS — L03119 Cellulitis of unspecified part of limb: Secondary | ICD-10-CM | POA: Diagnosis present

## 2018-04-07 DIAGNOSIS — Z87891 Personal history of nicotine dependence: Secondary | ICD-10-CM | POA: Diagnosis not present

## 2018-04-07 DIAGNOSIS — K2270 Barrett's esophagus without dysplasia: Secondary | ICD-10-CM | POA: Diagnosis present

## 2018-04-07 DIAGNOSIS — D696 Thrombocytopenia, unspecified: Secondary | ICD-10-CM | POA: Diagnosis present

## 2018-04-07 DIAGNOSIS — L03031 Cellulitis of right toe: Secondary | ICD-10-CM | POA: Diagnosis present

## 2018-04-07 DIAGNOSIS — E11621 Type 2 diabetes mellitus with foot ulcer: Secondary | ICD-10-CM | POA: Diagnosis present

## 2018-04-07 DIAGNOSIS — E11628 Type 2 diabetes mellitus with other skin complications: Secondary | ICD-10-CM | POA: Diagnosis present

## 2018-04-07 DIAGNOSIS — J45909 Unspecified asthma, uncomplicated: Secondary | ICD-10-CM | POA: Diagnosis present

## 2018-04-07 DIAGNOSIS — E782 Mixed hyperlipidemia: Secondary | ICD-10-CM

## 2018-04-07 DIAGNOSIS — E871 Hypo-osmolality and hyponatremia: Secondary | ICD-10-CM | POA: Diagnosis present

## 2018-04-07 DIAGNOSIS — S91205A Unspecified open wound of left lesser toe(s) with damage to nail, initial encounter: Secondary | ICD-10-CM | POA: Diagnosis not present

## 2018-04-07 DIAGNOSIS — E1169 Type 2 diabetes mellitus with other specified complication: Secondary | ICD-10-CM | POA: Diagnosis present

## 2018-04-07 DIAGNOSIS — E11649 Type 2 diabetes mellitus with hypoglycemia without coma: Secondary | ICD-10-CM | POA: Diagnosis not present

## 2018-04-07 DIAGNOSIS — A419 Sepsis, unspecified organism: Principal | ICD-10-CM | POA: Diagnosis present

## 2018-04-07 DIAGNOSIS — M1990 Unspecified osteoarthritis, unspecified site: Secondary | ICD-10-CM | POA: Diagnosis present

## 2018-04-07 DIAGNOSIS — G4733 Obstructive sleep apnea (adult) (pediatric): Secondary | ICD-10-CM | POA: Diagnosis present

## 2018-04-07 DIAGNOSIS — R600 Localized edema: Secondary | ICD-10-CM | POA: Diagnosis not present

## 2018-04-07 DIAGNOSIS — L97519 Non-pressure chronic ulcer of other part of right foot with unspecified severity: Secondary | ICD-10-CM | POA: Diagnosis not present

## 2018-04-07 DIAGNOSIS — D61818 Other pancytopenia: Secondary | ICD-10-CM | POA: Diagnosis present

## 2018-04-07 DIAGNOSIS — K219 Gastro-esophageal reflux disease without esophagitis: Secondary | ICD-10-CM | POA: Diagnosis present

## 2018-04-07 DIAGNOSIS — G5793 Unspecified mononeuropathy of bilateral lower limbs: Secondary | ICD-10-CM | POA: Diagnosis not present

## 2018-04-07 DIAGNOSIS — M79604 Pain in right leg: Secondary | ICD-10-CM

## 2018-04-07 DIAGNOSIS — M869 Osteomyelitis, unspecified: Secondary | ICD-10-CM | POA: Diagnosis present

## 2018-04-07 DIAGNOSIS — A689 Relapsing fever, unspecified: Secondary | ICD-10-CM

## 2018-04-07 DIAGNOSIS — E559 Vitamin D deficiency, unspecified: Secondary | ICD-10-CM | POA: Diagnosis present

## 2018-04-07 DIAGNOSIS — M868X7 Other osteomyelitis, ankle and foot: Secondary | ICD-10-CM | POA: Diagnosis present

## 2018-04-07 DIAGNOSIS — Z6841 Body Mass Index (BMI) 40.0 and over, adult: Secondary | ICD-10-CM | POA: Diagnosis not present

## 2018-04-07 DIAGNOSIS — M5416 Radiculopathy, lumbar region: Secondary | ICD-10-CM | POA: Diagnosis present

## 2018-04-07 DIAGNOSIS — M86171 Other acute osteomyelitis, right ankle and foot: Secondary | ICD-10-CM | POA: Diagnosis not present

## 2018-04-07 DIAGNOSIS — Z7984 Long term (current) use of oral hypoglycemic drugs: Secondary | ICD-10-CM

## 2018-04-07 DIAGNOSIS — I10 Essential (primary) hypertension: Secondary | ICD-10-CM | POA: Diagnosis present

## 2018-04-07 DIAGNOSIS — Z79891 Long term (current) use of opiate analgesic: Secondary | ICD-10-CM

## 2018-04-07 DIAGNOSIS — M79661 Pain in right lower leg: Secondary | ICD-10-CM | POA: Diagnosis not present

## 2018-04-07 DIAGNOSIS — R52 Pain, unspecified: Secondary | ICD-10-CM

## 2018-04-07 DIAGNOSIS — L97509 Non-pressure chronic ulcer of other part of unspecified foot with unspecified severity: Secondary | ICD-10-CM

## 2018-04-07 DIAGNOSIS — Z96652 Presence of left artificial knee joint: Secondary | ICD-10-CM | POA: Diagnosis present

## 2018-04-07 DIAGNOSIS — E1159 Type 2 diabetes mellitus with other circulatory complications: Secondary | ICD-10-CM | POA: Diagnosis present

## 2018-04-07 DIAGNOSIS — E785 Hyperlipidemia, unspecified: Secondary | ICD-10-CM | POA: Diagnosis present

## 2018-04-07 DIAGNOSIS — Z791 Long term (current) use of non-steroidal anti-inflammatories (NSAID): Secondary | ICD-10-CM

## 2018-04-07 DIAGNOSIS — L03115 Cellulitis of right lower limb: Secondary | ICD-10-CM | POA: Diagnosis present

## 2018-04-07 DIAGNOSIS — Z23 Encounter for immunization: Secondary | ICD-10-CM | POA: Diagnosis not present

## 2018-04-07 DIAGNOSIS — G8929 Other chronic pain: Secondary | ICD-10-CM | POA: Diagnosis present

## 2018-04-07 DIAGNOSIS — L84 Corns and callosities: Secondary | ICD-10-CM | POA: Diagnosis not present

## 2018-04-07 DIAGNOSIS — M7989 Other specified soft tissue disorders: Secondary | ICD-10-CM | POA: Diagnosis not present

## 2018-04-07 DIAGNOSIS — I872 Venous insufficiency (chronic) (peripheral): Secondary | ICD-10-CM | POA: Diagnosis present

## 2018-04-07 DIAGNOSIS — L089 Local infection of the skin and subcutaneous tissue, unspecified: Secondary | ICD-10-CM | POA: Diagnosis present

## 2018-04-07 DIAGNOSIS — M549 Dorsalgia, unspecified: Secondary | ICD-10-CM | POA: Diagnosis present

## 2018-04-07 DIAGNOSIS — R609 Edema, unspecified: Secondary | ICD-10-CM

## 2018-04-07 HISTORY — DX: Type 2 diabetes mellitus with other specified complication: E66.9

## 2018-04-07 HISTORY — DX: Obesity, unspecified: E11.69

## 2018-04-07 LAB — CBC WITH DIFFERENTIAL/PLATELET
BASOS ABS: 0 10*3/uL (ref 0.0–0.1)
BASOS PCT: 0 %
EOS ABS: 0 10*3/uL (ref 0.0–0.7)
Eosinophils Relative: 0 %
HCT: 33.9 % — ABNORMAL LOW (ref 39.0–52.0)
Hemoglobin: 11.9 g/dL — ABNORMAL LOW (ref 13.0–17.0)
Lymphocytes Relative: 9 %
Lymphs Abs: 0.7 10*3/uL (ref 0.7–4.0)
MCH: 32 pg (ref 26.0–34.0)
MCHC: 35.1 g/dL (ref 30.0–36.0)
MCV: 91.1 fL (ref 78.0–100.0)
MONO ABS: 0.6 10*3/uL (ref 0.1–1.0)
MONOS PCT: 8 %
Neutro Abs: 6.4 10*3/uL (ref 1.7–7.7)
Neutrophils Relative %: 83 %
Platelets: 120 10*3/uL — ABNORMAL LOW (ref 150–400)
RBC: 3.72 MIL/uL — ABNORMAL LOW (ref 4.22–5.81)
RDW: 13.5 % (ref 11.5–15.5)
WBC: 7.7 10*3/uL (ref 4.0–10.5)

## 2018-04-07 LAB — BASIC METABOLIC PANEL
ANION GAP: 6 (ref 5–15)
BUN: 16 mg/dL (ref 8–23)
CALCIUM: 8.9 mg/dL (ref 8.9–10.3)
CO2: 27 mmol/L (ref 22–32)
CREATININE: 0.84 mg/dL (ref 0.61–1.24)
Chloride: 98 mmol/L (ref 98–111)
GLUCOSE: 156 mg/dL — AB (ref 70–99)
Potassium: 3.8 mmol/L (ref 3.5–5.1)
Sodium: 131 mmol/L — ABNORMAL LOW (ref 135–145)

## 2018-04-07 LAB — I-STAT CG4 LACTIC ACID, ED: Lactic Acid, Venous: 2.01 mmol/L (ref 0.5–1.9)

## 2018-04-07 MED ORDER — CYCLOBENZAPRINE HCL 10 MG PO TABS
10.0000 mg | ORAL_TABLET | Freq: Three times a day (TID) | ORAL | Status: DC | PRN
  Administered 2018-04-08 – 2018-04-09 (×3): 10 mg via ORAL
  Filled 2018-04-07 (×3): qty 1

## 2018-04-07 MED ORDER — OLMESARTAN-AMLODIPINE-HCTZ 20-5-12.5 MG PO TABS: 1.0000 | ORAL_TABLET | Freq: Every day | ORAL | Status: DC

## 2018-04-07 MED ORDER — INSULIN ASPART 100 UNIT/ML ~~LOC~~ SOLN
0.0000 [IU] | Freq: Three times a day (TID) | SUBCUTANEOUS | Status: DC
  Administered 2018-04-08 – 2018-04-09 (×3): 1 [IU] via SUBCUTANEOUS
  Administered 2018-04-09: 2 [IU] via SUBCUTANEOUS
  Administered 2018-04-10: 1 [IU] via SUBCUTANEOUS

## 2018-04-07 MED ORDER — PIPERACILLIN-TAZOBACTAM 3.375 G IVPB 30 MIN: 3.3750 g | Freq: Three times a day (TID) | INTRAVENOUS | Status: DC

## 2018-04-07 MED ORDER — IRBESARTAN 150 MG PO TABS
150.0000 mg | ORAL_TABLET | Freq: Every day | ORAL | Status: DC
  Administered 2018-04-08 – 2018-04-10 (×3): 150 mg via ORAL
  Filled 2018-04-07 (×3): qty 1

## 2018-04-07 MED ORDER — PIPERACILLIN-TAZOBACTAM 3.375 G IVPB
3.3750 g | Freq: Three times a day (TID) | INTRAVENOUS | Status: DC
  Administered 2018-04-07 – 2018-04-10 (×8): 3.375 g via INTRAVENOUS
  Filled 2018-04-07 (×9): qty 50

## 2018-04-07 MED ORDER — MORPHINE SULFATE (PF) 4 MG/ML IV SOLN
4.0000 mg | Freq: Once | INTRAVENOUS | Status: AC
  Administered 2018-04-07: 4 mg via INTRAVENOUS
  Filled 2018-04-07: qty 1

## 2018-04-07 MED ORDER — SODIUM CHLORIDE 0.9 % IV BOLUS
1000.0000 mL | Freq: Once | INTRAVENOUS | Status: AC
  Administered 2018-04-07: 1000 mL via INTRAVENOUS

## 2018-04-07 MED ORDER — AMLODIPINE BESYLATE 5 MG PO TABS
5.0000 mg | ORAL_TABLET | Freq: Every day | ORAL | Status: DC
  Administered 2018-04-08 – 2018-04-10 (×3): 5 mg via ORAL
  Filled 2018-04-07 (×3): qty 1

## 2018-04-07 MED ORDER — ATORVASTATIN CALCIUM 10 MG PO TABS
20.0000 mg | ORAL_TABLET | Freq: Every day | ORAL | Status: DC
  Administered 2018-04-08 – 2018-04-09 (×2): 20 mg via ORAL
  Filled 2018-04-07 (×2): qty 2

## 2018-04-07 MED ORDER — ALBUTEROL SULFATE (2.5 MG/3ML) 0.083% IN NEBU: 2.5000 mg | INHALATION_SOLUTION | Freq: Four times a day (QID) | RESPIRATORY_TRACT | Status: DC | PRN

## 2018-04-07 MED ORDER — HYDROCHLOROTHIAZIDE 12.5 MG PO CAPS
12.5000 mg | ORAL_CAPSULE | Freq: Every day | ORAL | Status: DC
  Administered 2018-04-08 – 2018-04-10 (×3): 12.5 mg via ORAL
  Filled 2018-04-07 (×3): qty 1

## 2018-04-07 MED ORDER — ONDANSETRON HCL 4 MG PO TABS: 4.0000 mg | ORAL_TABLET | Freq: Four times a day (QID) | ORAL | Status: DC | PRN

## 2018-04-07 MED ORDER — ONDANSETRON HCL 4 MG/2ML IJ SOLN: 4.0000 mg | Freq: Four times a day (QID) | INTRAMUSCULAR | Status: DC | PRN

## 2018-04-07 MED ORDER — NAPHAZOLINE-PHENIRAMINE 0.025-0.3 % OP SOLN
1.0000 [drp] | Freq: Four times a day (QID) | OPHTHALMIC | Status: DC | PRN
  Filled 2018-04-07: qty 15

## 2018-04-07 MED ORDER — VANCOMYCIN HCL IN DEXTROSE 1-5 GM/200ML-% IV SOLN
1000.0000 mg | Freq: Once | INTRAVENOUS | Status: AC
  Administered 2018-04-07: 1000 mg via INTRAVENOUS
  Filled 2018-04-07: qty 200

## 2018-04-07 MED ORDER — VANCOMYCIN HCL 500 MG IV SOLR
INTRAVENOUS | Status: AC
  Filled 2018-04-07: qty 500

## 2018-04-07 MED ORDER — PIPERACILLIN-TAZOBACTAM 3.375 G IVPB 30 MIN
3.3750 g | Freq: Once | INTRAVENOUS | Status: DC
  Filled 2018-04-07 (×2): qty 50

## 2018-04-07 MED ORDER — TRAMADOL HCL 50 MG PO TABS
100.0000 mg | ORAL_TABLET | Freq: Three times a day (TID) | ORAL | Status: DC | PRN
  Administered 2018-04-08 (×2): 100 mg via ORAL
  Filled 2018-04-07 (×2): qty 2

## 2018-04-07 MED ORDER — VANCOMYCIN HCL 1000 MG IV SOLR
INTRAVENOUS | Status: AC
  Filled 2018-04-07: qty 1000

## 2018-04-07 MED ORDER — HYDROCHLOROTHIAZIDE 12.5 MG PO CAPS: 12.5000 mg | ORAL_CAPSULE | Freq: Every day | ORAL | Status: DC

## 2018-04-07 MED ORDER — ACETAMINOPHEN 650 MG RE SUPP: 650.0000 mg | Freq: Four times a day (QID) | RECTAL | Status: DC | PRN

## 2018-04-07 MED ORDER — VANCOMYCIN HCL 10 G IV SOLR
1500.0000 mg | Freq: Once | INTRAVENOUS | Status: AC
  Administered 2018-04-07: 1500 mg via INTRAVENOUS
  Filled 2018-04-07: qty 1500

## 2018-04-07 MED ORDER — HYDROCODONE-ACETAMINOPHEN 5-325 MG PO TABS
1.0000 | ORAL_TABLET | Freq: Four times a day (QID) | ORAL | Status: DC | PRN
  Administered 2018-04-07 – 2018-04-10 (×8): 1 via ORAL
  Filled 2018-04-07 (×8): qty 1

## 2018-04-07 MED ORDER — ACETAMINOPHEN 325 MG PO TABS
650.0000 mg | ORAL_TABLET | Freq: Once | ORAL | Status: AC
  Administered 2018-04-07: 650 mg via ORAL
  Filled 2018-04-07: qty 2

## 2018-04-07 MED ORDER — MELOXICAM 15 MG PO TABS
15.0000 mg | ORAL_TABLET | Freq: Every day | ORAL | Status: DC | PRN
  Administered 2018-04-08: 15 mg via ORAL
  Filled 2018-04-07 (×3): qty 1

## 2018-04-07 MED ORDER — ACETAMINOPHEN 325 MG PO TABS: 650.0000 mg | ORAL_TABLET | Freq: Four times a day (QID) | ORAL | Status: DC | PRN

## 2018-04-07 MED ORDER — VANCOMYCIN HCL 10 G IV SOLR
1500.0000 mg | Freq: Two times a day (BID) | INTRAVENOUS | Status: DC
  Administered 2018-04-08 – 2018-04-10 (×5): 1500 mg via INTRAVENOUS
  Filled 2018-04-07 (×5): qty 1500

## 2018-04-07 MED ORDER — BUTALBITAL-APAP-CAFFEINE 50-325-40 MG PO TABS
1.0000 | ORAL_TABLET | Freq: Four times a day (QID) | ORAL | Status: DC | PRN
  Administered 2018-04-08: 1 via ORAL
  Filled 2018-04-07: qty 1

## 2018-04-07 MED ORDER — ENOXAPARIN SODIUM 60 MG/0.6ML ~~LOC~~ SOLN
60.0000 mg | Freq: Every day | SUBCUTANEOUS | Status: DC
  Administered 2018-04-07 – 2018-04-09 (×3): 60 mg via SUBCUTANEOUS
  Filled 2018-04-07 (×3): qty 0.6

## 2018-04-07 NOTE — Progress Notes (Addendum)
Pt here for an acute care OV today   Impression and Recommendations:    1. Recurrent fever -  cellulitis versus osteomyelitis versus possible sepsis   2. Cellulitis of lower extremity, unspecified laterality   3. Acute pain of right lower extremity, along with redness, increased warmth and significant F/C   4. Type 2 diabetes mellitus with complication, without long-term current use of insulin ([place])   5. Morbid obesity BMI >er 48     ACUTE ONSET  Recurrent LE Cellulitis, fevers, malaise, nausea  -Notified pt and his wife [name] that he needs emergent evaluation at this time to ensure there is not a blood clot and more than likely this is recurrent cellulitis with possible osteomyelitis with need for extensive blood cultures and potential IV antibiotics that can not be done in outpatient setting. -Strongly encouraged pt and wife that he needs to return to the emergency department -Discussed concerning nature of rapid onset of symptoms and redness spreading on leg  -Contacted Dr. Coralyn Pear at [facility] to them of pt and update them for transfer of care  -Without immediate further workup and treatment planned, risk to pt's morbidity is high with possible prolonged functional impairment and poor health outcomes.     Education and routine counseling performed. Handouts provided   Patient will call with any questions prior to using medication if they have concerns.    Expresses verbal understanding and consents to current therapy and treatment regimen.  No barriers to understanding were identified.  Red flag symptoms and signs discussed in detail.  Patient expressed understanding regarding what to do in case of emergency\urgent symptoms   Please see AVS handed out to patient at the end of our visit for further patient instructions/ counseling done pertaining to today's office visit.   Return Follow-up after ER.     Note:  This document was prepared  occasionally using Dragon voice recognition software and may include unintentional dictation errors in addition to a scribe.  This document serves as a record of services personally performed by [name], MD. It was created on her behalf by Georga Bora, a trained medical scribe. The creation of this record is based on the scribe's personal observations and the provider's statements to them.   I have reviewed the above medical documentation for accuracy and completeness and I concur.  [name] 04/08/18 12:24 PM   --------------------------------------------------------------------------------------------------------------------------------------------------------------------------------------------------------------------------------------------    Subjective:    CC:  Chief Complaint  Patient presents with  . Leg Pain    HPI: Robert [name] is a 62 y.o. male who presents to [place] [facility] today for issues as discussed below.  Acute visit today -Pt went to ED yesterday around 7pm and left around 1am "without being seen" -Yesterday early afternoon pt started experiencing a headache, with fever of 101.5 -Wife stated that this morning his rectal temperature was around 103 and "mouth temperatures are often inaccurate with him" -Pt is complaining of headache, "all over muscle and bone aches"  -Began with fever, headache, chills, and went to ER upon wife noticing redness on back of leg -Wife states the ER says they had swollen glands -Took tylenol around 10am -Pt is pale, feeling very unwell -Pt feeling nausea, no vomiting, no diarrhea -Has eating a small serving of dry life cereal, seltzer, water, and tea -Denies increased heart rate, difficulty breathing, dizziness  -Pt has diabetes and BMI of 44.9   Previous ER  visit 03/14/2018 -Pt was previously hospitalized from 8/30 to 9/2 for right leg cellulitis -At that time he also had significant  fevers, headache, nausea and felt ill -Initially had concerns for possible meningitis however were unable to obtain LP (per Pt) and IV antibiotics were started of vancomycin, rosephin, ampicillin and doxycycline and then changed; see previous chart  -Pt was seen 03/24/2018 in hospital follow up and noted to be well and in stable condition at that time  Wt Readings from Last 3 Encounters:  04/07/18 (!) 346 lb (156.9 kg)  04/07/18 (!) 340 lb (154.2 kg)  03/24/18 (!) 340 lb (154.2 kg)   BP Readings from Last 3 Encounters:  04/08/18 107/64  04/07/18 (!) 109/58  04/06/18 129/65   BMI Readings from Last 3 Encounters:  04/07/18 45.65 kg/m  04/07/18 44.86 kg/m  03/24/18 44.86 kg/m     Patient Care Team    Relationship Specialty Notifications Start End  [name], DO PCP - General Family Medicine  02/20/16   Artist Pais, MD  Orthopedic Surgery  04/11/16   Dian Situ, MD  Pain Medicine  04/11/16   Tanda Rockers, MD Consulting Physician Pulmonary Disease  04/11/16    Comment: Obstructive sleep apnea, restrictive lung disease due to severe morbid obesity  Awilda Metro, PA-C  Pain Medicine  07/23/17      Patient Active Problem List   Diagnosis Date Noted  . Type 2 diabetes mellitus with complication, without long-term current use of insulin ([place]) 09/30/2017    Priority: High  . Essential hypertension 09/30/2017    Priority: High  . Mixed diabetic hyperlipidemia associated with type 2 diabetes mellitus ([place]) 09/30/2017    Priority: High  . Morbid obesity BMI >er 48 02/15/2012    Priority: High  . 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 Chronic venous stasis dermatitis of both lower extremities 05/08/2016    Priority: Medium  . OSA on CPAP- seen Dr Melvyn Novas in past 04/11/2016    Priority: Medium  . Restrictive lung disease secondary to obesity 04/11/2016    Priority: Medium  .  h/o Depressive disorder 04/11/2016    Priority: Medium  . Vitamin D deficiency 05/08/2016    Priority: Low  . Physical deconditioning 04/11/2016    Priority: Low  . History of tobacco use 04/11/2016    Priority: Low  . Postlaminectomy syndrome, lumbar region 01/04/2012    Priority: Low  . h/o Narcotic abuse 01/04/2012    Priority: Low  . Constipation 09/22/2011    Priority: Low  . Chronic back pain 09/22/2011    Priority: Low  . Hyperlipidemia 04/08/2018  . Sepsis due to cellulitis ([place]) 04/07/2018  . Fever 03/15/2018  . Headache 03/15/2018  . Anemia 03/15/2018  . Inactivity 01/09/2018  . Generalized osteoarthritis 01/09/2018  . Contact dermatitis and eczema 01/09/2018  . Arthritis of carpometacarpal [facility]) joint of left thumb 07/02/2017  . Bilateral leg cramps 05/09/2017  . acute Left heel pain 03/28/2017  . Tick bite 02/11/2017  . Barrett's esophagus 12/07/2016  . Chronic pain of left heel 11/27/2016  . Bilateral lower extremity pitting edema 04/11/2016  . Lumbar radiculopathy 07/21/2012  . Chronic pain due to injury 07/21/2012  . Back pain 01/04/2012  . Dyspnea 11/27/2011  . Confusion 09/22/2011  . Abdominal pain 09/22/2011  . Dehydration 09/22/2011  . Nausea & vomiting 09/22/2011    Past Medical history,  Surgical history, Family history, Social history, Allergies and Medications have been entered into the medical record, reviewed and changed as needed.    Current Meds  Medication Sig  . albuterol (PROVENTIL HFA;VENTOLIN HFA) 108 (90 Base) MCG/ACT inhaler Inhale 2 puffs into the lungs every 6 (six) hours as needed for wheezing or shortness of breath.  . APPLE CIDER VINEGAR PO Take 15 mLs by mouth 2 (two) times daily.   Marland Kitchen atorvastatin (LIPITOR) 20 MG tablet TAKE 1 TABLET BY MOUTH EVERYDAY AT BEDTIME (Patient taking differently: Take 20 mg by mouth daily at 6 PM. )  . B Complex Vitamins (B COMPLEX 100 PO) Take 1 capsule by mouth every other day.   . blood glucose  meter kit and supplies KIT Dispense based on patient and insurance preference. Use up to four times daily as directed. (FOR ICD-9 250.00, 250.01).  . blood glucose meter kit and supplies Dispense based on patient and insurance preference. Use to check glucose fasting in the AM and then after largest meal of the day.. (FOR ICD-10 E10.9, E11.9).  Marland Kitchen Blood Glucose Monitoring Suppl (ONE TOUCH ULTRA MINI) w/Device KIT Use to test blood sugar, test in the morning fasting and test after largest meal of the day.  . butalbital-acetaminophen-caffeine (FIORICET, ESGIC) 50-325-40 MG tablet Take 1 tablet by mouth every 6 (six) hours as needed for headache.  . Cholecalciferol (VITAMIN D3) 5000 units TABS 5,000 IU OTC vitamin D3 daily. (Patient taking differently: Take 5,000 Units by mouth daily. )  . cyclobenzaprine (FLEXERIL) 10 MG tablet TAKE 1 TABLET BY MOUTH TWICE A DAY (Patient taking differently: Take 10 mg by mouth 3 (three) times daily as needed for muscle spasms. )  . glucose blood (ONE TOUCH ULTRA TEST) test strip Use to test blood sugar, test in the morning fasting and test after largest meal of the day.  . hydrochlorothiazide (MICROZIDE) 12.5 MG capsule Take 1 capsule (12.5 mg total) by mouth daily. (Patient not taking: Reported on 04/07/2018)  . meloxicam (MOBIC) 15 MG tablet Take 1 tablet (15 mg total) by mouth daily. (Patient taking differently: Take 15 mg by mouth daily as needed for pain. )  . metFORMIN (GLUCOPHAGE) 500 MG tablet Take 2 tablets (1,000 mg total) by mouth 2 (two) times daily with a meal.  . naphazoline-pheniramine (NAPHCON-A) 0.025-0.3 % ophthalmic solution Place 1 drop into both eyes 4 (four) times daily as needed for eye irritation.  Glory Rosebush DELICA LANCETS 35K MISC Use for testing blood sugar, test once in the morning fasting and test after largest meal of the day.  . Potassium 75 MG TABS Take 150 mg by mouth every other day.   . traMADol (ULTRAM) 50 MG tablet Take 100 mg by mouth 3  (three) times daily. For pain  . vitamin C (ASCORBIC ACID) 500 MG tablet Take 500 mg by mouth daily.  . Vitamin D, Ergocalciferol, (DRISDOL) 50000 units CAPS capsule TAKE 1 CAPSULE WEEKLY (Patient taking differently: Take 50,000 Units by mouth every Friday. )  . VITAMIN E PO Take 1 tablet by mouth daily.  Marland Kitchen zinc gluconate 50 MG tablet Take 50 mg by mouth every other day.     Allergies:  No Known Allergies   Review of Systems: General:   +fever, chills, denies unexplained weight loss.  Optho/Auditory:   Denies visual changes, blurred vision/LOV Respiratory:   Denies wheeze, DOE more than baseline levels.   Cardiovascular:   Denies chest pain, palpitations, new onset peripheral edema  Gastrointestinal:  Denies vomiting, diarrhea, abd pain.  + N.  Genitourinary: Denies dysuria, freq/ urgency, flank pain or discharge from genitals.  Endocrine:     Denies hot or cold intolerance, polyuria, polydipsia. Musculoskeletal:   + Significant pain right lower extremity Skin:  + onset rash  Neurological:     Denies dizziness, numbness, feels weak Psychiatric/Behavioral:   Denies mood changes, suicidal or homicidal ideations, hallucinations    Objective:   Blood pressure (!) 109/58, pulse 99, temperature 98.7 F (37.1 C), height 6' 1"  (1.854 m), weight (!) 340 lb (154.2 kg), SpO2 94 %. Body mass index is 44.86 kg/m. General: Ill-appearing, appropriate for stated age.  Neuro:  Alert and oriented,  extra-ocular muscles intact  HEENT:  Normocephalic, atraumatic, neck supple Skin: Significant increased warmth, erythema and cellulitis right lower extremity.  No tracking proximally.  girth of right lower extremity around mid calf larger than left.  Bilateral pitting edema. Cardiac:  RRR, S1 S2 Respiratory:  ECTA B/L and A/P, Not using accessory muscles, speaking in full sentences- unlabored. Vascular:  no cyanosis apprec.; cap RF less 2 sec.

## 2018-04-07 NOTE — Progress Notes (Signed)
Lovenox per Pharmacy for DVT Prophylaxis    Pharmacy has been consulted from dosing enoxaparin (lovenox) in this patient for DVT prophylaxis.  The pharmacist has reviewed pertinent labs (Hgb _11.9__; PLT  120___), patient weight (_156__kg) and renal function (CrCl_>90__mL/min) and decided that enoxaparin _60_mg SQ Q24Hrs is appropriate for this patient.  The pharmacy department will sign off at this time.  Please reconsult pharmacy if status changes or for further issues.  Thank you  Luetta NuttingJulian Crowford Adie Vilar, Jr PharmD, BCPS  04/07/2018, 10:15 PM

## 2018-04-07 NOTE — Patient Instructions (Addendum)
Patient's wife, Revonda Standardllison did not wish to wait for the discharge summary and took patient to the [facility].   I called and spoke with Dr. Cathren LaineKevin Steinl at [facility] ER regarding patient condition of T-max 103.6 today, acute right lower extremity erythema, swelling and significant pain times less than 24 hours.  Explained patient was recently hospitalized from 03/14/2018 to 03/17/2018 for similar condition.  I had seen him in follow-up to that hospitalization on 03/24/2018 and patient had appeared well at that time.    Patient recently had antipyretics and was not febrile in the office today.  He was hemodynamically stable as well.  Revonda Standardllison his wife prefers to transfer him to the [facility] med center via private vehicle at this time.

## 2018-04-07 NOTE — H&P (Signed)
History and Physical    Robert [name] OIZ:[id number] DOB: Apr 30, 1956 DOA: 04/07/2018  Referring MD/NP/PA: Aletta Edouard, MD PCP: [name], DO  Patient coming from: [facility] transfer  Chief Complaint: Right leg pain  I have personally briefly reviewed patient's old medical records in [place]   HPI: Robert [name] is a 62 y.o. male with medical history significant of DM type 2, OSA on CPAP, Barrett's esophagus, and GERD; who presents with complaints of right leg pain since yesterday afternoon. Patient just recently hospitalized from 8/30-9/2 for right leg cellulitis ultimately discharged home on doxycycline.    His wife first noticed that his right leg was red in color, and had developed a rectal temperature of 103 .51F. He has been alternating Advil and Tylenol for fever symptoms.  He had gone to was a long hospital last night where his fever was noted to be 101.1 F, but ultimately left prior to being formally seen due to long wait times.  He followed up with his primary care provider today and was sent to [place] due to symptoms.  Patient has had associated symptoms of headache, leg swelling, and increased warmth.  He reports recently going to [employer] and [place] where the callus of the second digit of his right toe came off.  Denies any drainage/discharge, chest pain, palpitations shortness of breath, cough, nausea, vomiting, dysuria, or diarrhea.  ED Course: Upon admission into the emergency department patient was noted to have a temperature up to 99.4 F, and all other vital signs relatively within normal limits.  Labs revealed WBC 7.7 and lactic acid 2.01.  Blood cultures were obtained and patient was started on vancomycin and Zosyn for recurrent cellulitis.  Patient was accepted to a MedSurg bed as inpatient to the [facility] service.  Review of Systems  Constitutional: Positive for fever and malaise/fatigue.  HENT: Negative for congestion and ear discharge.   Eyes:  Positive for photophobia.  Respiratory: Negative for cough and shortness of breath.   Cardiovascular: Positive for leg swelling. Negative for chest pain.  Gastrointestinal: Negative for abdominal pain, nausea and vomiting.  Genitourinary: Negative for dysuria and frequency.  Musculoskeletal: Positive for myalgias. Negative for joint pain.  Skin: Negative for itching.       Positive for skin color change  Neurological: Positive for headaches. Negative for loss of consciousness.  Psychiatric/Behavioral: Negative for memory loss and substance abuse.    Past Medical History:  Diagnosis Date  . Allergic rhinitis due to pollen   . Arthritis   . Asthma   . Back pain   . Barrett esophagus   . Depressive disorder   . Exposure to hepatitis B   . Exposure to hepatitis C   . Narcotic abuse (HCC)    pain medications  . OSA (obstructive sleep apnea)    on CPAP     Past Surgical History:  Procedure Laterality Date  . ABDOMINAL SURGERY    . BACK SURGERY     Rods and screws in lumbar area  . COLONOSCOPY    . ESOPHAGOGASTRODUODENOSCOPY  multiple  . HERNIA REPAIR     4  . JOINT REPLACEMENT    . TOTAL KNEE ARTHROPLASTY  2009   left     reports that he quit smoking about 13 years ago. His smoking use included cigarettes. He has a 12.00 pack-year smoking history. He has never used smokeless tobacco. He reports that he does not drink alcohol or use drugs.  No Known Allergies  Family History  Problem Relation Age of Onset  . Allergies Mother   . Early death Father   . Thyroid disease Sister   . Colon cancer Neg Hx   . Stomach cancer Neg Hx     Prior to Admission medications   Medication Sig Start Date End Date Taking? Authorizing Provider  albuterol (PROVENTIL HFA;VENTOLIN HFA) 108 (90 Base) MCG/ACT inhaler Inhale 2 puffs into the lungs every 6 (six) hours as needed for wheezing or shortness of breath.    [provider]  APPLE CIDER VINEGAR PO Take by mouth.    [provider]  atorvastatin (LIPITOR) 20 MG tablet TAKE 1 TABLET BY MOUTH EVERYDAY AT BEDTIME 12/23/17   Opalski, Deborah, DO  B Complex Vitamins (B COMPLEX 100 PO) Take 1 capsule by mouth every 3 (three) days.     [provider]  blood glucose meter kit and supplies KIT Dispense based on patient and insurance preference. Use up to four times daily as directed. (FOR ICD-9 250.00, 250.01). 05/09/17   Opalski, Deborah, DO  blood glucose meter kit and supplies Dispense based on patient and insurance preference. Use to check glucose fasting in the AM and then after largest meal of the day.. (FOR ICD-10 E10.9, E11.9). 12/04/17   Opalski, Neoma Laming, DO  Blood Glucose Monitoring Suppl (ONE TOUCH ULTRA MINI) w/Device KIT Use to test blood sugar, test in the morning fasting and test after largest meal of the day. 05/28/17   [name], DO  butalbital-acetaminophen-caffeine (FIORICET, ESGIC) [phone] MG tablet Take 1 tablet by mouth every 6 (six) hours as needed for headache. 03/17/18   Lavina Hamman, MD  Cholecalciferol (VITAMIN D3) 5000 units TABS 5,000 IU OTC vitamin D3 daily. Patient taking differently: Take 5,000 Units by mouth daily.  05/08/16   Opalski, Deborah, DO  cyclobenzaprine (FLEXERIL) 10 MG tablet TAKE 1 TABLET BY MOUTH TWICE A DAY Patient taking differently: Take 10 mg by mouth 3 (three) times daily as needed for muscle spasms.  02/05/18   Opalski, Deborah, DO  glucose blood (ONE TOUCH ULTRA TEST) test strip Use to test blood sugar, test in the morning fasting and test after largest meal of the day. 05/28/17   Opalski, Neoma Laming, DO  hydrochlorothiazide (MICROZIDE) 12.5 MG capsule Take 1 capsule (12.5 mg total) by mouth daily. 03/18/18   Lavina Hamman, MD  meloxicam (MOBIC) 15 MG tablet Take 1 tablet (15 mg total) by mouth daily. Patient taking differently: Take 15 mg by mouth daily as needed for pain.  01/09/18   [name], DO  metFORMIN (GLUCOPHAGE) 500 MG tablet Take 2  tablets (1,000 mg total) by mouth 2 (two) times daily with a meal. 11/18/17   Opalski, Deborah, DO  naphazoline-pheniramine (NAPHCON-A) 0.025-0.3 % ophthalmic solution Place 1 drop into both eyes 4 (four) times daily as needed for eye irritation.    [provider]  [facility] DELICA LANCETS 53I MISC Use for testing blood sugar, test once in the morning fasting and test after largest meal of the day. 05/28/17   [name], DO  Potassium 75 MG TABS Take 150 mg by mouth every other day.     [provider]  traMADol (ULTRAM) 50 MG tablet Take 100 mg by mouth 3 (three) times daily. For pain    [provider]  vitamin C (ASCORBIC ACID) 500 MG tablet Take 500 mg by mouth daily.    [provider]  Vitamin D, Ergocalciferol, (  DRISDOL) 50000 units CAPS capsule TAKE 1 CAPSULE WEEKLY Patient taking differently: Take 50,000 Units by mouth every Friday.  08/05/17   Opalski, Deborah, DO  VITAMIN E PO Take 1 tablet by mouth daily.    [provider]  zinc gluconate 50 MG tablet Take 50 mg by mouth daily.    [provider]    Physical Exam:  Constitutional: Obese male in NAD, calm, comfortable Vitals:   04/07/18 1643 04/07/18 1645 04/07/18 1924 04/07/18 2039  BP:  109/73 (!) 114/59 (!) 110/56  Pulse:  82 86 79  Resp:  20 18 20   Temp:  99.4 F (37.4 C)  99.3 F (37.4 C)  TempSrc:  Oral  Oral  SpO2:  100% 96% 96%  Weight: (!) 154.2 kg     Height: 6' 1"  (1.854 m)      Eyes: PERRL, lids and conjunctivae normal ENMT: Mucous membranes are moist. Posterior pharynx clear of any exudate or lesions.Normal dentition.  Neck: normal, supple, no masses, no thyromegaly Respiratory: clear to auscultation bilaterally, no wheezing, no crackles. Normal respiratory effort. No accessory muscle use.  Cardiovascular: Regular rate and rhythm, no murmurs / rubs / gallops.  +1 pitting lower extremity edema. 1+ pedal pulses. No carotid bruits.  Abdomen: no tenderness,  no masses palpated. No hepatosplenomegaly. Bowel sounds positive.  Musculoskeletal: no clubbing / cyanosis. No joint deformity upper and lower extremities. Good ROM, no contractures. Normal muscle tone.  Skin: Increased warmth and erythema of the right leg from the ankle to the shin.  Ulceration of the right second digit present with erythema of the toe. Neurologic: CN 2-12 grossly intact. Sensation intact, DTR normal. Strength 5/5 in all 4.  Psychiatric: Normal judgment and insight. Alert and oriented x 3. Normal mood.     Labs on Admission: I have personally reviewed following labs and imaging studies  CBC: Recent Labs  Lab 04/07/18 1900  WBC 7.7  NEUTROABS 6.4  HGB 11.9*  HCT 33.9*  MCV 91.1  PLT 326*   Basic Metabolic Panel: Recent Labs  Lab 04/07/18 1900  NA 131*  K 3.8  CL 98  CO2 27  GLUCOSE 156*  BUN 16  CREATININE 0.84  CALCIUM 8.9   GFR: Estimated Creatinine Clearance: 137.7 mL/min (by C-G formula based on SCr of 0.84 mg/dL). Liver Function Tests: No results for input(s): AST, ALT, ALKPHOS, BILITOT, PROT, ALBUMIN in the last 168 hours. No results for input(s): LIPASE, AMYLASE in the last 168 hours. No results for input(s): AMMONIA in the last 168 hours. Coagulation Profile: No results for input(s): INR, PROTIME in the last 168 hours. Cardiac Enzymes: No results for input(s): CKTOTAL, CKMB, CKMBINDEX, TROPONINI in the last 168 hours. BNP (last 3 results) No results for input(s): PROBNP in the last 8760 hours. HbA1C: No results for input(s): HGBA1C in the last 72 hours. CBG: No results for input(s): GLUCAP in the last 168 hours. Lipid Profile: No results for input(s): CHOL, HDL, LDLCALC, TRIG, CHOLHDL, LDLDIRECT in the last 72 hours. Thyroid Function Tests: No results for input(s): TSH, T4TOTAL, FREET4, T3FREE, THYROIDAB in the last 72 hours. Anemia Panel: No results for input(s): VITAMINB12, FOLATE, FERRITIN, TIBC, IRON, RETICCTPCT in the last 72  hours. Urine analysis:    Component Value Date/Time   COLORURINE YELLOW 03/15/2018 [place] 03/15/2018 0235   LABSPEC 1.021 03/15/2018 0235   PHURINE 5.0 03/15/2018 0235   GLUCOSEU NEGATIVE 03/15/2018 0235   HGBUR NEGATIVE 03/15/2018 0235   BILIRUBINUR  NEGATIVE 03/15/2018 [place] 03/15/2018 0235   PROTEINUR NEGATIVE 03/15/2018 0235   UROBILINOGEN 0.2 05/31/2012 1658   NITRITE NEGATIVE 03/15/2018 0235   LEUKOCYTESUR NEGATIVE 03/15/2018 0235   Sepsis Labs: No results found for this or any previous visit (from the past 240 hour(s)).   Radiological Exams on Admission: Dg Chest 2 View  Result Date: 04/06/2018 CLINICAL DATA:  Sore throat and fever. EXAM: CHEST - 2 VIEW COMPARISON:  03/14/2018 FINDINGS: The lungs are clear without focal pneumonia, edema, pneumothorax or pleural effusion. The cardiopericardial silhouette is within normal limits for size. The visualized bony structures of the thorax are intact. IMPRESSION: No active cardiopulmonary disease. Electronically Signed   By: Misty Stanley M.D.   On: 04/06/2018 19:53    Chest x-ray: Independently reviewed.  No acute abnormality noted  Assessment/Plan Sepsis due to cellulitis: Acute.  Patient presents with erythema of the right leg with fevers reported up to 103 F at home and initial lactic acid elevated up to 2.01.  WBC noted to be 7.7.  Suspect recurrence of cellulitis possibly provoked following trip [place] [employer] [place].  Patient was empirically started on vancomycin and Zosyn.   - Admit to a MedSurg bed - Follow-up blood cultures - Trend lactic acid level - Continue empiric antibiotics of vancomycin and Zosyn - Hydrocodone prn pain - Wound care consult - Check ESR in a.m.  - May warrant imaging of right toe   Diabetes mellitus type 2, in obese: Patient presents with a blood glucose of 156.  Last hemoglobin A1c noted to be 5.8 on 03/17/2018.  Patient presents with initial blood glucose  of 156 on blood work.  Patient's BMI noted to be 45.65. - Hypoglycemic protocol - Hold metformin - CBGs q. AC with sensitive SSI  Essential hypertension - Continue hydrochlorothiazide-olmesartan  Hyponatremia: Acute.  Initial sodium 131 on admission. - Recheck sodium in a.m.  OSA on CPAP -  CPAP per RT  Hyperlipidemia - Continue atorvastatin  History of Barrett's esophagus   DVT prophylaxis: Lovenox Code Status: Full Family Communication: Discussed plan of care with the patient and his wife present at bedside. Disposition Plan: Likely discharge home in 2 to 3 days Consults called: none  Admission status: Inpatient   Norval Morton MD [facility] Pager [phone]   If 7PM-7AM, please contact night-coverage [email or web address] Password [facility]  04/07/2018, 9:53 PM

## 2018-04-07 NOTE — Plan of Care (Signed)
[facility] transfer discussed with Dr. Meridee ScoreMichael Butler  Mr. [place] is a 62 year old male with pmh of DM, GERD, and OSA on CPAP; who presents with right leg pain and redness.  Patient just recently discharged from the hospital from 8/30-9/2 for cellulitis.  Fever up to 103 F prior to arrival.  Labs revealed WBC 7.7, lactic acid 2.01.  Blood cultures were obtained and patient was started on vancomycin for recurrent cellulitis.  Admit to a MedSurg bed as inpatient.

## 2018-04-07 NOTE — ED Triage Notes (Signed)
[facility] and his MD today for pain and redness in his right lower leg. He was told to come here for further evaluation.

## 2018-04-07 NOTE — ED Provider Notes (Signed)
[name]MENT Provider Note   CSN: [id number] Arrival date & time: 04/07/18  1633     History   Chief Complaint Chief Complaint  Patient presents with  . Wound Check    HPI Robert [name] is a 62 y.o. male.  He was recently admitted for cellulitis of his right lower leg last month.  He had been doing well up until yesterday when he noticed some pain in his lower leg and went to [place] long but the wait was too long and he did not stick around.  He saw his PCP today who referred him here for further evaluation.  He is complaining of fevers with a T-max of 103 and feeling body aches and fatigue.  No chest pain no shortness of breath no abdominal pain no vomiting no diarrhea.  They have been doing Tylenol and ibuprofen for the fevers.  He does not recall any trauma to the legs.  The history is provided by the patient.  Illness  This is a recurrent problem. The current episode started yesterday. The problem occurs constantly. The problem has not changed since onset.Associated symptoms include headaches. Pertinent negatives include no chest pain, no abdominal pain and no shortness of breath. The symptoms are aggravated by walking. Nothing relieves the symptoms. He has tried rest and acetaminophen for the symptoms. The treatment provided mild relief.    Past Medical History:  Diagnosis Date  . Allergic rhinitis due to pollen   . Arthritis   . Asthma   . Back pain   . Barrett esophagus   . Depressive disorder   . Exposure to hepatitis B   . Exposure to hepatitis C   . Narcotic abuse (HCC)    pain medications  . OSA (obstructive sleep apnea)    on CPAP     Patient Active Problem List   Diagnosis Date Noted  . Chronic neuropathy of both feet-  due to lumbar radiculopathy as well as diabetic neuropathy 03/25/2018  . Fever 03/15/2018  . Headache 03/15/2018  . Anemia 03/15/2018  . Inactivity 01/09/2018  . Generalized osteoarthritis 01/09/2018  . Contact  dermatitis and eczema 01/09/2018  . Type 2 diabetes mellitus with complication, without long-term current use of insulin ([place]) 09/30/2017  . Hypertension associated with diabetes ([place]) 09/30/2017  . Mixed diabetic hyperlipidemia associated with type 2 diabetes mellitus ([place]) 09/30/2017  . Arthritis of carpometacarpal [facility]) joint of left thumb 07/02/2017  . Bilateral leg cramps 05/09/2017  . acute Left heel pain 03/28/2017  . Tick bite 02/11/2017  . Barrett's esophagus 12/07/2016  . Chronic pain of left heel 11/27/2016  . Chronic venous stasis dermatitis of both lower extremities 05/08/2016  . Vitamin D deficiency 05/08/2016  . OSA on CPAP- seen Dr Melvyn Novas in past 04/11/2016  . Bilateral lower extremity pitting edema 04/11/2016  . Physical deconditioning 04/11/2016  . Restrictive lung disease secondary to obesity 04/11/2016  . History of tobacco use 04/11/2016  . h/o Depressive disorder 04/11/2016  . Lumbar radiculopathy 07/21/2012  . Chronic pain due to injury 07/21/2012  . Morbid obesity BMI >er 48 02/15/2012  . Back pain 01/04/2012  . Postlaminectomy syndrome, lumbar region 01/04/2012  . h/o Narcotic abuse 01/04/2012  . Dyspnea 11/27/2011  . Confusion 09/22/2011  . Abdominal pain 09/22/2011  . Constipation 09/22/2011  . Chronic back pain 09/22/2011  . Dehydration 09/22/2011  . Nausea & vomiting 09/22/2011    Past Surgical History:  Procedure Laterality Date  . ABDOMINAL SURGERY    .  BACK SURGERY     Rods and screws in lumbar area  . COLONOSCOPY    . ESOPHAGOGASTRODUODENOSCOPY  multiple  . HERNIA REPAIR     4  . JOINT REPLACEMENT    . TOTAL KNEE ARTHROPLASTY  2009   left        Home Medications    Prior to Admission medications   Medication Sig Start Date End Date Taking? Authorizing Provider  albuterol (PROVENTIL HFA;VENTOLIN HFA) 108 (90 Base) MCG/ACT inhaler Inhale 2 puffs into the lungs every 6 (six) hours as needed for wheezing or shortness of breath.     [provider]  APPLE CIDER VINEGAR PO Take by mouth.    [provider]  atorvastatin (LIPITOR) 20 MG tablet TAKE 1 TABLET BY MOUTH EVERYDAY AT BEDTIME 12/23/17   Opalski, Deborah, DO  B Complex Vitamins (B COMPLEX 100 PO) Take 1 capsule by mouth every 3 (three) days.     [provider]  blood glucose meter kit and supplies KIT Dispense based on patient and insurance preference. Use up to four times daily as directed. (FOR ICD-9 250.00, 250.01). 05/09/17   Opalski, Deborah, DO  blood glucose meter kit and supplies Dispense based on patient and insurance preference. Use to check glucose fasting in the AM and then after largest meal of the day.. (FOR ICD-10 E10.9, E11.9). 12/04/17   Opalski, Neoma Laming, DO  Blood Glucose Monitoring Suppl (ONE TOUCH ULTRA MINI) w/Device KIT Use to test blood sugar, test in the morning fasting and test after largest meal of the day. 05/28/17   [name], DO  butalbital-acetaminophen-caffeine (FIORICET, ESGIC) [phone] MG tablet Take 1 tablet by mouth every 6 (six) hours as needed for headache. 03/17/18   Lavina Hamman, MD  Cholecalciferol (VITAMIN D3) 5000 units TABS 5,000 IU OTC vitamin D3 daily. Patient taking differently: Take 5,000 Units by mouth daily.  05/08/16   Opalski, Deborah, DO  cyclobenzaprine (FLEXERIL) 10 MG tablet TAKE 1 TABLET BY MOUTH TWICE A DAY Patient taking differently: Take 10 mg by mouth 3 (three) times daily as needed for muscle spasms.  02/05/18   Opalski, Deborah, DO  glucose blood (ONE TOUCH ULTRA TEST) test strip Use to test blood sugar, test in the morning fasting and test after largest meal of the day. 05/28/17   Opalski, Neoma Laming, DO  hydrochlorothiazide (MICROZIDE) 12.5 MG capsule Take 1 capsule (12.5 mg total) by mouth daily. 03/18/18   Lavina Hamman, MD  meloxicam (MOBIC) 15 MG tablet Take 1 tablet (15 mg total) by mouth daily. Patient taking differently: Take 15 mg by mouth daily as needed for pain.   01/09/18   [name], DO  metFORMIN (GLUCOPHAGE) 500 MG tablet Take 2 tablets (1,000 mg total) by mouth 2 (two) times daily with a meal. 11/18/17   Opalski, Deborah, DO  naphazoline-pheniramine (NAPHCON-A) 0.025-0.3 % ophthalmic solution Place 1 drop into both eyes 4 (four) times daily as needed for eye irritation.    [provider]  [facility] DELICA LANCETS 63J MISC Use for testing blood sugar, test once in the morning fasting and test after largest meal of the day. 05/28/17   [name], DO  Potassium 75 MG TABS Take 150 mg by mouth every other day.     [provider]  traMADol (ULTRAM) 50 MG tablet Take 100 mg by mouth 3 (three) times daily. For pain    [provider]  vitamin C (ASCORBIC ACID) 500 MG tablet Take 500 mg  by mouth daily.    [provider]  Vitamin D, Ergocalciferol, (DRISDOL) 50000 units CAPS capsule TAKE 1 CAPSULE WEEKLY Patient taking differently: Take 50,000 Units by mouth every Friday.  08/05/17   Opalski, Deborah, DO  VITAMIN E PO Take 1 tablet by mouth daily.    [provider]  zinc gluconate 50 MG tablet Take 50 mg by mouth daily.    [provider]    Family History Family History  Problem Relation Age of Onset  . Allergies Mother   . Early death Father   . Thyroid disease Sister   . Colon cancer Neg Hx   . Stomach cancer Neg Hx     Social History Social History   Tobacco Use  . Smoking status: Former Smoker    Packs/day: 0.80    Years: 15.00    Pack years: 12.00    Types: Cigarettes    Last attempt to quit: 07/16/2004    Years since quitting: 13.7  . Smokeless tobacco: Never Used  Substance Use Topics  . Alcohol use: No  . Drug use: No    Comment: Prior abuse of narcotics     Allergies   Patient has no known allergies.   Review of Systems Review of Systems  Constitutional: Positive for chills, fatigue and fever.  HENT: Negative for sore throat.   Eyes: Negative for redness.    Respiratory: Negative for shortness of breath.   Cardiovascular: Negative for chest pain.  Gastrointestinal: Negative for abdominal pain.  Genitourinary: Negative for dysuria.  Musculoskeletal: Positive for arthralgias and myalgias.  Skin: Positive for rash.  Neurological: Positive for headaches.     Physical Exam Updated Vital Signs BP 109/73   Pulse 82   Temp 99.4 F (37.4 C) (Oral)   Resp 20   Ht 6' 1"  (1.854 m)   Wt (!) 154.2 kg   SpO2 100%   BMI 44.85 kg/m   Physical Exam  Constitutional: He is oriented to person, place, and time. He appears well-developed and well-nourished.  HENT:  Head: Normocephalic and atraumatic.  Eyes: Conjunctivae are normal.  Neck: Neck supple.  Cardiovascular: Normal rate and regular rhythm.  No murmur heard. Pulmonary/Chest: Effort normal and breath sounds normal. No respiratory distress.  Abdominal: Soft. There is no tenderness.  Musculoskeletal: Normal range of motion. He exhibits tenderness. He exhibits no edema.  Neurological: He is alert and oriented to person, place, and time. He has normal strength. GCS eye subscore is 4. GCS verbal subscore is 5. GCS motor subscore is 6.  Skin: Skin is warm and dry.  He is a large area of erythema and warmth of his right lower leg from his ankle up to his knee.  It is diffusely tender but no crepitus there.  Psychiatric: He has a normal mood and affect.  Nursing note and vitals reviewed.      ED Treatments / Results  Labs (all labs ordered are listed, but only abnormal results are displayed) Labs Reviewed  CULTURE, BLOOD (ROUTINE X 2)  CULTURE, BLOOD (ROUTINE X 2)  BASIC METABOLIC PANEL  CBC WITH DIFFERENTIAL/PLATELET  LACTIC ACID, PLASMA  LACTIC ACID, PLASMA    EKG None  Radiology Dg Chest 2 View  Result Date: 04/06/2018 CLINICAL DATA:  Sore throat and fever. EXAM: CHEST - 2 VIEW COMPARISON:  03/14/2018 FINDINGS: The lungs are clear without focal pneumonia, edema, pneumothorax  or pleural effusion. The cardiopericardial silhouette is within normal limits for size. The visualized bony structures  of the thorax are intact. IMPRESSION: No active cardiopulmonary disease. Electronically Signed   By: Misty Stanley M.D.   On: 04/06/2018 19:53    Procedures Procedures (including critical care time)  Medications Ordered in ED Medications  sodium chloride 0.9 % bolus 1,000 mL (has no administration in time range)  morphine 4 MG/ML injection 4 mg (has no administration in time range)  vancomycin (VANCOCIN) 1,500 mg in sodium chloride 0.9 % 500 mL IVPB (has no administration in time range)     Initial Impression / Assessment and Plan / ED Course  I have reviewed the triage vital signs and the nursing notes.  Pertinent labs & imaging results that were available during my care of the patient were reviewed by me and considered in my medical decision making (see chart for details).  Clinical Course as of Apr 07 2320  Mon Apr 07, 2018  2004 Discussed with Dr. Tamala Julian from the hospitalist service who will accept him to his service.   [MB]    Clinical Course User Index [MB] Hayden Rasmussen, MD      Final Clinical Impressions(s) / ED Diagnoses   Final diagnoses:  Cellulitis of right lower leg    ED Discharge Orders    None       Hayden Rasmussen, MD 04/07/18 2322

## 2018-04-08 ENCOUNTER — Inpatient Hospital Stay (HOSPITAL_COMMUNITY): Payer: Medicare Other

## 2018-04-08 DIAGNOSIS — E785 Hyperlipidemia, unspecified: Secondary | ICD-10-CM | POA: Insufficient documentation

## 2018-04-08 DIAGNOSIS — L0390 Cellulitis, unspecified: Secondary | ICD-10-CM

## 2018-04-08 LAB — GLUCOSE, CAPILLARY
GLUCOSE-CAPILLARY: 127 mg/dL — AB (ref 70–99)
GLUCOSE-CAPILLARY: 132 mg/dL — AB (ref 70–99)
GLUCOSE-CAPILLARY: 103 mg/dL — AB (ref 70–99)
Glucose-Capillary: 97 mg/dL (ref 70–99)

## 2018-04-08 LAB — CBC
HEMATOCRIT: 31.8 % — AB (ref 39.0–52.0)
HEMOGLOBIN: 11.1 g/dL — AB (ref 13.0–17.0)
MCH: 31.4 pg (ref 26.0–34.0)
MCHC: 34.9 g/dL (ref 30.0–36.0)
MCV: 89.8 fL (ref 78.0–100.0)
Platelets: 120 10*3/uL — ABNORMAL LOW (ref 150–400)
RBC: 3.54 MIL/uL — ABNORMAL LOW (ref 4.22–5.81)
RDW: 13.5 % (ref 11.5–15.5)
WBC: 6.3 10*3/uL (ref 4.0–10.5)

## 2018-04-08 LAB — BASIC METABOLIC PANEL
ANION GAP: 8 (ref 5–15)
BUN: 10 mg/dL (ref 8–23)
CHLORIDE: 103 mmol/L (ref 98–111)
CO2: 27 mmol/L (ref 22–32)
Calcium: 8.6 mg/dL — ABNORMAL LOW (ref 8.9–10.3)
Creatinine, Ser: 0.83 mg/dL (ref 0.61–1.24)
GFR calc non Af Amer: >60 mL/min (ref >60)
Glucose, Bld: 144 mg/dL — ABNORMAL HIGH (ref 70–99)
POTASSIUM: 3.7 mmol/L (ref 3.5–5.1)
Sodium: 138 mmol/L (ref 135–145)

## 2018-04-08 LAB — SEDIMENTATION RATE: Sed Rate: 45 mm/hr — ABNORMAL HIGH (ref 0–16)

## 2018-04-08 LAB — LACTIC ACID, PLASMA: Lactic Acid, Venous: 0.9 mmol/L (ref 0.5–1.9)

## 2018-04-08 MED ORDER — MUPIROCIN CALCIUM 2 % EX CREA
TOPICAL_CREAM | Freq: Every day | CUTANEOUS | Status: DC
  Administered 2018-04-08 – 2018-04-10 (×3): via TOPICAL
  Filled 2018-04-08: qty 15

## 2018-04-08 NOTE — Progress Notes (Signed)
Pharmacy Antibiotic Note  Robert [name] is a 62 y.o. male admitted on 04/07/2018 with sepsis.  Pharmacy has been consulted for Vancomycin, zosyn dosing.  Plan: Zosyn 3.375g IV q8h (4 hour infusion).   Vancomycin 1.5gm iv x1 ([facility]), then 1gm x1 more; then begin vancomycin 1500mg  iv q12hr  Goal AUC = 400 - 500 for all indications, except meningitis (goal AUC > 500 and Cmin 15-20 mcg/mL)   Height: 6\' 1"  (185.4 cm) Weight: (!) 346 lb (156.9 kg) IBW/kg (Calculated) : 79.9  Temp (24hrs), Avg:99 F (37.2 C), Min:98.7 F (37.1 C), Max:99.4 F (37.4 C)  Recent Labs  Lab 04/07/18 1900 04/07/18 1909  WBC 7.7  --   CREATININE 0.84  --   LATICACIDVEN  --  2.01*    Estimated Creatinine Clearance: 139.1 mL/min (by C-G formula based on SCr of 0.84 mg/dL).    No Known Allergies  Antimicrobials this admission: Vancomycin 04/07/2018 >> Zosyn 04/07/2018 >>   Dose adjustments this admission: -  Microbiology results: -  Thank you for allowing pharmacy to be a part of this patient's care.  Robert [name], Robert [name] 04/08/2018 5:29 AM

## 2018-04-08 NOTE — Progress Notes (Signed)
PROGRESS NOTE    Robert [name]  UJW:[id number] DOB: Aug 24, 1955 DOA: 04/07/2018 PCP: Thomasene Lot, DO   Brief Narrative:  19 with history of diabetes mellitus type 2, obstructive sleep apnea on CPAP, Barrett's esophagus and GERD came to the hospital for evaluation of right lower extremity pain concerning for cellulitis.  He was diagnosed with right lower extremity cellulitis about 3-4 weeks ago and was discharged on doxycycline.  After going home he stated he never really felt better or this resolved therefore returns back with the symptoms.   Assessment & Plan:   Principal Problem:   Sepsis due to cellulitis [facility]) Active Problems:   OSA on CPAP- seen Dr Sherene Sires in past   Type 2 diabetes mellitus with complication, without long-term current use of insulin (HCC)   Essential hypertension   Hyperlipidemia  Sepsis secondary to right lower extremity cellulitis Right second toe small wound - Patient is currently hemodynamically stable and doing better.  Cultures are still pending.  Lactate has trended down -We will get bilateral lower extremity Dopplers to rule out any DVT -We will continue vancomycin and Zosyn today and likely de-escalate tomorrow - Continue pain control. -Wound care consult has been ordered  Diabetes mellitus type 2, obese -Continue home regimen.  Hemoglobin A1c was 5.8 on 03/17/2018  Essential hypertension -Resume home meds  Obstructive sleep apnea on CPAP  Hyperlipidemia -Continue statin  DVT prophylaxis: Lovenox Code Status: Full code Family Communication: None at bedside Disposition Plan: Maintain inpatient stay for more culture data to be available and for IV antibiotics.  Consultants:   Wound care  Procedures:   None  Antimicrobials:   Vancomycin and Zosyn 9/23 >   Subjective: Feels a little better after getting IV fluids and antibiotics.  Still complains of right lower extremity pain.  Review of Systems Otherwise negative except as per  HPI, including: General: Denies fever, chills, night sweats or unintended weight loss. Resp: Denies cough, wheezing, shortness of breath. Cardiac: Denies chest pain, palpitations, orthopnea, paroxysmal nocturnal dyspnea. GI: Denies abdominal pain, nausea, vomiting, diarrhea or constipation GU: Denies dysuria, frequency, hesitancy or incontinence MS: Denies muscle aches, joint pain or swelling Neuro: Denies headache, neurologic deficits (focal weakness, numbness, tingling), abnormal gait Psych: Denies anxiety, depression, SI/HI/AVH Skin: Denies new rashes or lesions ID: Denies sick contacts, exotic exposures, travel  Objective: Vitals:   04/08/18 0533 04/08/18 0952 04/08/18 1015 04/08/18 1351  BP: (!) 109/52 107/64 107/64 117/73  Pulse: 70 71  67  Resp: 20 18  18   Temp: 99.2 F (37.3 C) 99 F (37.2 C)  98.4 F (36.9 C)  TempSrc:  Oral  Oral  SpO2: 96% 100%  97%  Weight:      Height:        Intake/Output Summary (Last 24 hours) at 04/08/2018 1515 Last data filed at 04/08/2018 0930 Gross per 24 hour  Intake 2000.59 ml  Output 1300 ml  Net 700.59 ml   Filed Weights   04/07/18 1643 04/07/18 2154  Weight: (!) 154.2 kg (!) 156.9 kg    Examination:  General exam: Appears calm and comfortable  Respiratory system: Clear to auscultation. Respiratory effort normal. Cardiovascular system: S1 & S2 heard, RRR. No JVD, murmurs, rubs, gallops or clicks. No pedal edema. Gastrointestinal system: Abdomen is nondistended, soft and nontender. No organomegaly or masses felt. Normal bowel sounds heard. Central nervous system: Alert and oriented. No focal neurological deficits. Extremities: Symmetric 5 x 5 power. Skin: Right lower extremity erythema noted with slight  improvement in it.  Slightly tender to touch as well.  Right second toe noted to have full-thickness wound. Psychiatry: Judgement and insight appear normal. Mood & affect appropriate.     Data Reviewed:   CBC: Recent Labs    Lab 04/07/18 1900 04/08/18 0554  WBC 7.7 6.3  NEUTROABS 6.4  --   HGB 11.9* 11.1*  HCT 33.9* 31.8*  MCV 91.1 89.8  PLT 120* 120*   Basic Metabolic Panel: Recent Labs  Lab 04/07/18 1900 04/08/18 0554  NA 131* 138  K 3.8 3.7  CL 98 103  CO2 27 27  GLUCOSE 156* 144*  BUN 16 10  CREATININE 0.84 0.83  CALCIUM 8.9 8.6*   GFR: Estimated Creatinine Clearance: 140.8 mL/min (by C-G formula based on SCr of 0.83 mg/dL). Liver Function Tests: No results for input(s): AST, ALT, ALKPHOS, BILITOT, PROT, ALBUMIN in the last 168 hours. No results for input(s): LIPASE, AMYLASE in the last 168 hours. No results for input(s): AMMONIA in the last 168 hours. Coagulation Profile: No results for input(s): INR, PROTIME in the last 168 hours. Cardiac Enzymes: No results for input(s): CKTOTAL, CKMB, CKMBINDEX, TROPONINI in the last 168 hours. BNP (last 3 results) No results for input(s): PROBNP in the last 8760 hours. HbA1C: No results for input(s): HGBA1C in the last 72 hours. CBG: Recent Labs  Lab 04/08/18 0754 04/08/18 1155  GLUCAP 127* 132*   Lipid Profile: No results for input(s): CHOL, HDL, LDLCALC, TRIG, CHOLHDL, LDLDIRECT in the last 72 hours. Thyroid Function Tests: No results for input(s): TSH, T4TOTAL, FREET4, T3FREE, THYROIDAB in the last 72 hours. Anemia Panel: No results for input(s): VITAMINB12, FOLATE, FERRITIN, TIBC, IRON, RETICCTPCT in the last 72 hours. Sepsis Labs: Recent Labs  Lab 04/07/18 1909 04/08/18 0745  LATICACIDVEN 2.01* 0.9    Recent Results (from the past 240 hour(s))  Culture, blood (routine x 2)     Status: None (Preliminary result)   Collection Time: 04/07/18  6:49 PM  Result Value Ref Range Status   Specimen Description   Final    BLOOD RIGHT ANTECUBITAL Performed at [facility] [place], [address] [place], [place] [address]    Special Requests   Final    BOTTLES DRAWN AEROBIC AND ANAEROBIC Blood Culture adequate volume Performed  at [facility] [place], [address] [place], [place] [address]    Culture   Final    NO GROWTH < 24 HOURS Performed at [facility], [address] [place], [place] [address]    Report Status PENDING  Incomplete  Culture, blood (routine x 2)     Status: None (Preliminary result)   Collection Time: 04/07/18  7:00 PM  Result Value Ref Range Status   Specimen Description   Final    BLOOD BLOOD RIGHT HAND Performed at [facility] [place], [address] [place], [place] [address]    Special Requests   Final    BOTTLES DRAWN AEROBIC AND ANAEROBIC Blood Culture adequate volume Performed at [facility] [place], [address] [place], [place] [address]    Culture   Final    NO GROWTH < 24 HOURS Performed at [facility], [address] [place], [place] [address]    Report Status PENDING  Incomplete         Radiology Studies: Dg Chest 2 View  Result Date: 04/06/2018 CLINICAL DATA:  Sore throat and fever. EXAM: CHEST - 2 VIEW  COMPARISON:  03/14/2018 FINDINGS: The lungs are clear without focal pneumonia, edema, pneumothorax or pleural effusion. The cardiopericardial silhouette is within normal limits for size. The visualized bony structures of the thorax are intact. IMPRESSION: No active cardiopulmonary disease. Electronically Signed   By: Kennith Center M.D.   On: 04/06/2018 19:53        Scheduled Meds: . amLODipine  5 mg Oral Daily  . atorvastatin  20 mg Oral q1800  . enoxaparin (LOVENOX) injection  60 mg Subcutaneous QHS  . hydrochlorothiazide  12.5 mg Oral Daily  . insulin aspart  0-9 Units Subcutaneous TID WC  . irbesartan  150 mg Oral Daily  . mupirocin cream   Topical Daily   Continuous Infusions: . piperacillin-tazobactam (ZOSYN)  IV 3.375 g (04/08/18 0539)  . vancomycin 1,500 mg (04/08/18 1019)     LOS: 1 day   Time spent= 30 mins    Kathrynn Backstrom Joline Maxcy, MD [facility] Pager [phone]   If 7PM-7AM, please contact  night-coverage [email or web address] Password [facility] 04/08/2018, 3:15 PM

## 2018-04-08 NOTE — Consult Note (Addendum)
WOC Nurse wound consult note Reason for Consult: Consult requested for right 2nd toe.  Pt is a diabetic and states he had a dry hard callous which became moist and macerated at a water park, then fell off and evolved into a full thickness wound. Wound type: Right 2nd toe tip full thickness wound; .5X.5X.1cm Wound bed: red and dry with calloused slightly raised edges surrounding the wound Drainage (amount, consistency, odor) small amt yellow drainage, no odor or fluctuance Dressing procedure/placement/frequency: Bactroban to provide antimicrobial benefits and promote moist healing, foam dressing to protect from further injury.  Pt states he plans to follow-up with a podiatrist after discharge for trimming of the callus edges and continued monitoring. Please re-consult if further assistance is needed.  Thank-you,  Cammie Mcgeeawn Momin Misko MSN, RN, CWOCN, [place]AP, CNS [phone]

## 2018-04-08 NOTE — Progress Notes (Signed)
Pt states that he will self administer CPAP when ready for bed.  Pt to notify RT if any assistance is needed, RT to monitor and assess as needed.

## 2018-04-08 NOTE — Progress Notes (Signed)
LE venous duplex prelim: negative for DVT. Rowynn Mcweeney Eunice, RDMS, RVT  

## 2018-04-09 ENCOUNTER — Inpatient Hospital Stay (HOSPITAL_COMMUNITY): Payer: Medicare Other

## 2018-04-09 DIAGNOSIS — Z9989 Dependence on other enabling machines and devices: Secondary | ICD-10-CM

## 2018-04-09 DIAGNOSIS — A419 Sepsis, unspecified organism: Principal | ICD-10-CM

## 2018-04-09 DIAGNOSIS — L97519 Non-pressure chronic ulcer of other part of right foot with unspecified severity: Secondary | ICD-10-CM

## 2018-04-09 DIAGNOSIS — D696 Thrombocytopenia, unspecified: Secondary | ICD-10-CM

## 2018-04-09 DIAGNOSIS — G5793 Unspecified mononeuropathy of bilateral lower limbs: Secondary | ICD-10-CM

## 2018-04-09 DIAGNOSIS — D649 Anemia, unspecified: Secondary | ICD-10-CM

## 2018-04-09 DIAGNOSIS — I10 Essential (primary) hypertension: Secondary | ICD-10-CM

## 2018-04-09 DIAGNOSIS — L0390 Cellulitis, unspecified: Secondary | ICD-10-CM

## 2018-04-09 DIAGNOSIS — E118 Type 2 diabetes mellitus with unspecified complications: Secondary | ICD-10-CM

## 2018-04-09 DIAGNOSIS — G4733 Obstructive sleep apnea (adult) (pediatric): Secondary | ICD-10-CM

## 2018-04-09 DIAGNOSIS — K2270 Barrett's esophagus without dysplasia: Secondary | ICD-10-CM

## 2018-04-09 DIAGNOSIS — E785 Hyperlipidemia, unspecified: Secondary | ICD-10-CM

## 2018-04-09 LAB — CBC WITH DIFFERENTIAL/PLATELET
BASOS ABS: 0 10*3/uL (ref 0.0–0.1)
BASOS PCT: 0 %
EOS ABS: 0.2 10*3/uL (ref 0.0–0.7)
Eosinophils Relative: 4 %
HCT: 34.6 % — ABNORMAL LOW (ref 39.0–52.0)
Hemoglobin: 11.7 g/dL — ABNORMAL LOW (ref 13.0–17.0)
Lymphocytes Relative: 20 %
Lymphs Abs: 0.8 10*3/uL (ref 0.7–4.0)
MCH: 31.4 pg (ref 26.0–34.0)
MCHC: 33.8 g/dL (ref 30.0–36.0)
MCV: 92.8 fL (ref 78.0–100.0)
Monocytes Absolute: 0.6 10*3/uL (ref 0.1–1.0)
Monocytes Relative: 13 %
Neutro Abs: 2.7 10*3/uL (ref 1.7–7.7)
Neutrophils Relative %: 63 %
Platelets: 135 10*3/uL — ABNORMAL LOW (ref 150–400)
RBC: 3.73 MIL/uL — AB (ref 4.22–5.81)
RDW: 13.6 % (ref 11.5–15.5)
WBC: 4.3 10*3/uL (ref 4.0–10.5)

## 2018-04-09 LAB — COMPREHENSIVE METABOLIC PANEL
ALBUMIN: 3.6 g/dL (ref 3.5–5.0)
ALK PHOS: 48 U/L (ref 38–126)
ALT: 29 U/L (ref 0–44)
ANION GAP: 9 (ref 5–15)
AST: 24 U/L (ref 15–41)
BUN: 10 mg/dL (ref 8–23)
CALCIUM: 8.8 mg/dL — AB (ref 8.9–10.3)
CO2: 25 mmol/L (ref 22–32)
Chloride: 105 mmol/L (ref 98–111)
Creatinine, Ser: 0.81 mg/dL (ref 0.61–1.24)
GFR calc Af Amer: >60 mL/min (ref >60)
GFR calc non Af Amer: >60 mL/min (ref >60)
GLUCOSE: 135 mg/dL — AB (ref 70–99)
Potassium: 4 mmol/L (ref 3.5–5.1)
SODIUM: 139 mmol/L (ref 135–145)
Total Bilirubin: 0.8 mg/dL (ref 0.3–1.2)
Total Protein: 6.8 g/dL (ref 6.5–8.1)

## 2018-04-09 LAB — CREATININE, SERUM: Creatinine, Ser: 0.86 mg/dL (ref 0.61–1.24)

## 2018-04-09 LAB — GLUCOSE, CAPILLARY
GLUCOSE-CAPILLARY: 118 mg/dL — AB (ref 70–99)
GLUCOSE-CAPILLARY: 154 mg/dL — AB (ref 70–99)
Glucose-Capillary: 129 mg/dL — ABNORMAL HIGH (ref 70–99)

## 2018-04-09 LAB — PHOSPHORUS: PHOSPHORUS: 3.1 mg/dL (ref 2.5–4.6)

## 2018-04-09 LAB — MAGNESIUM: Magnesium: 1.9 mg/dL (ref 1.7–2.4)

## 2018-04-09 MED ORDER — INFLUENZA VAC SPLIT QUAD 0.5 ML IM SUSY
0.5000 mL | PREFILLED_SYRINGE | INTRAMUSCULAR | Status: AC
  Administered 2018-04-10: 0.5 mL via INTRAMUSCULAR
  Filled 2018-04-09: qty 0.5

## 2018-04-09 MED ORDER — LORAZEPAM 2 MG/ML IJ SOLN
2.0000 mg | Freq: Once | INTRAMUSCULAR | Status: AC | PRN
  Administered 2018-04-09: 2 mg via INTRAVENOUS
  Filled 2018-04-09: qty 1

## 2018-04-09 MED ORDER — GADOBUTROL 1 MMOL/ML IV SOLN
10.0000 mL | Freq: Once | INTRAVENOUS | Status: AC | PRN
  Administered 2018-04-09: 10 mL via INTRAVENOUS

## 2018-04-09 NOTE — Progress Notes (Signed)
PROGRESS NOTE    Robert [name]  UEK:[id number] DOB: 1956-02-26 DOA: 04/07/2018 PCP: [name], DO   Brief Narrative:  The patient is a 70 with history of diabetes mellitus type 2, obstructive sleep apnea on CPAP, Barrett's esophagus and GERD came to the hospital for evaluation of right lower extremity pain concerning for cellulitis.  He was diagnosed with right lower extremity cellulitis about 3-4 weeks ago and was discharged on doxycycline.  After going home he stated he never really felt better or this resolved therefore returns back with the symptoms. Also noted to have an ulcer on Right 2nd toe. Currently being treated with IV Abx and obtaining MRI to r/o Osteomyelitis.   Assessment & Plan:   Principal Problem:   Sepsis due to cellulitis [facility]) Active Problems:   OSA on CPAP- seen Dr Melvyn Novas in past   Barrett's esophagus   Type 2 diabetes mellitus with complication, without long-term current use of insulin ([place])   Essential hypertension   Normocytic anemia   Chronic neuropathy of both feet-  due to lumbar radiculopathy as well as diabetic neuropathy   Hyperlipidemia   Thrombocytopenia (HCC)  Sepsis secondary to Right Lower Extremity Cellulitis Right second toe small wound r/o Osteomyelitis  -Patient is currently hemodynamically stable and doing better as sepsis physiology has improved.  -Blood Cx x2 showed NGTD at 2 days -Lactate has trended down  -Bilateral lower extremity Dopplers to rule out any DVT was negative for DVT in the preliminary report -We will continue Vancomycin and Zosyn today and likely de-escalate tomorrow -Wound care consult has been ordered and recommending Bactroban -Obtain Foot Imaging with DG Foot X-Ray Complete, MRI of Foot to r/o Osteomyelitis, and DG Tibia/Fibula X-Ray of Cellulitic Area -DG Foot Xray showed: Advanced degenerative changes at the 1st MTP joint. No acute bony abnormality. Specifically, no fracture, subluxation, or dislocation. No  radiographic changes of osteomyelitis. -DG Tibia/Fibula showed: No acute fracture. No dislocation. No destructive bone lesion. Tricompartment osteoarthritic change in the knee is noted. Soft tissue swelling in the leg is present. -MRI Foot pending  -Continue Leg Elevation -ESR was 45 -Obtain ABI as well given diminished Pulse  -C/w Meloxicam 15 mg po Daily prn and Cyclobenzaprine 10 mg po TIDprn, Tramadol 100 mg po TID prn Moderate Pain, Hydrocodone-Acetaminophen 1 tab po q6hprn Severe Pain -Patient has Follow up with Podiatry as an outpatient and recommending keeping appointment if MRI is Negative    Diabetes Mellitus Type 2, obese -Holding Home Regimen at this time with Metformin  -Hemoglobin A1c was 5.8 on 03/17/2018 -C/w Sensitive Novolog SSI AC -C/w Hypoglycemic -CBG's ranging from 97-118  Essential Hypertension -C/w Home Regimen substitution with Irbesartan 150 g p.o. daily, Amlodipine 5 mg p.o. daily, Hydrochlorthiazide 12.5 mg p.o. daily  Obstructive Sleep Apnea -C/w Home CPAP  Hyperlipidemia -Continue Atorvastatin 20 mg po qHS  Hx of Barrett's Esophagus/GERD -Not on any PPI per MAR  Hyponatremia -Resolved -Continue to Monitor CMP  Normocytic Anemia -Patient's hemoglobin/hematocrit went from 11.9/33.9 is now 11.7/34.6 -Check anemia panel -Continue monitor for signs and symptoms of bleeding -Repeat CBC in AM   Thrombocytopenia -Likely Reactive in the setting of Infection -Platelet Count went from 120 -> 135 -Continue to Monitor for S/Sx of Bleeding -Repeat CBC in AM   DVT prophylaxis: Enoxaparin 60 mg sq qHS Code Status: FULL CODE Family Communication: No family present at bedside  Disposition Plan: Remain Inpatient for further workup and evaluation  Consultants:   None   Procedures:  LE VENOUS DUPLEX -Negative for DVT  LE ARTERIAL DUPLEX with ABI    Antimicrobials:  Anti-infectives (From admission, onward)   Start     Dose/Rate Route  Frequency Ordered Stop   04/08/18 1000  vancomycin (VANCOCIN) 1,500 mg in sodium chloride 0.9 % 500 mL IVPB     1,500 mg 250 mL/hr over 120 Minutes Intravenous Every 12 hours 04/07/18 2211     04/08/18 0600  piperacillin-tazobactam (ZOSYN) IVPB 3.375 g  Status:  Discontinued     3.375 g 100 mL/hr over 30 Minutes Intravenous Every 8 hours 04/07/18 2210 04/07/18 2212   04/07/18 2215  vancomycin (VANCOCIN) IVPB 1000 mg/200 mL premix     1,000 mg 200 mL/hr over 60 Minutes Intravenous  Once 04/07/18 2210 04/08/18 0005   04/07/18 2215  piperacillin-tazobactam (ZOSYN) IVPB 3.375 g     3.375 g 12.5 mL/hr over 240 Minutes Intravenous Every 8 hours 04/07/18 2214     04/07/18 1945  piperacillin-tazobactam (ZOSYN) IVPB 3.375 g  Status:  Discontinued     3.375 g 100 mL/hr over 30 Minutes Intravenous  Once 04/07/18 1937 04/07/18 2214   04/07/18 1917  vancomycin (VANCOCIN) 1000 MG powder    Note to Pharmacy:  Marinus Maw   : cabinet override      04/07/18 1917 04/07/18 1930   04/07/18 1917  vancomycin (VANCOCIN) 500 MG powder    Note to Pharmacy:  Marinus Maw   : cabinet override      04/07/18 1917 04/07/18 1930   04/07/18 1845  vancomycin (VANCOCIN) 1,500 mg in sodium chloride 0.9 % 500 mL IVPB     1,500 mg 250 mL/hr over 120 Minutes Intravenous  Once 04/07/18 1834 04/07/18 2030     Subjective: Seen and examined at bedside and patient was still having some pain in the right foot and leg.  Leg was still extremely swollen.  Thinks the redness is getting a little bit better however started noticing some blistering.  Right second toe ulcer patient with some drainage.  No chest pain, lightheadedness or dizziness.  No other concerns or complaints at this time.  Objective: Vitals:   04/08/18 2110 04/08/18 2211 04/09/18 0522 04/09/18 0524  BP: 124/72   (!) 95/53  Pulse: 75   (!) 59  Resp: _0 Temp: 98.1 F (36.7 C)  98.1 F (36.7 C)   TempSrc: Oral  Oral   SpO2: 95%   97%  Weight:       Height:        Intake/Output Summary (Last 24 hours) at 04/09/2018 1618 Last data filed at 04/09/2018 1000 Gross per 24 hour  Intake 600 ml  Output -  Net 600 ml   Filed Weights   04/07/18 1643 04/07/18 2154  Weight: (!) 154.2 kg (!) 156.9 kg   Examination: Physical Exam:  Constitutional: WN/WD morbidly obese Caucasian male in NAD and appears calm and comfortable Eyes: Lids and conjunctivae normal, sclerae anicteric  ENMT: External Ears, Nose appear normal. Grossly normal hearing. Mucous membranes are moist.  Neck: Appears normal, supple, no cervical masses, normal ROM, no appreciable thyromegaly, no appreciable JVD Respiratory: Diminished to auscultation bilaterally, no wheezing, rales, rhonchi or crackles. Normal respiratory effort and patient is not tachypenic. No accessory muscle use.  Cardiovascular: RRR, no murmurs / rubs / gallops. S1 and S2 auscultated. Right LE 1-2+ LE Edema with warmth. Abdomen: Soft, non-tender, Distended 2/2 to Body Habitus. No masses palpated. No appreciable hepatosplenomegaly. Bowel sounds positive  x4.  GU: Deferred. Musculoskeletal: No clubbing / cyanosis of digits/nails. No joint deformity upper and lower extremities. Skin: Has significant Right Leg swelling with warmth and erythema on the shin with some blistering. Right 2nd Toe witgh an ulcer. No induration; Warm and dry.  Neurologic: CN 2-12 grossly intact with no appreciable focal deficits. Romberg sign and cerebellar reflexes not assessed.  Psychiatric: Normal judgment and insight. Alert and oriented x 3. Normal mood and appropriate affect.   Data Reviewed: I have personally reviewed following labs and imaging studies  CBC: Recent Labs  Lab 04/07/18 1900 04/08/18 0554 04/09/18 0841  WBC 7.7 6.3 4.3  NEUTROABS 6.4  --  2.7  HGB 11.9* 11.1* 11.7*  HCT 33.9* 31.8* 34.6*  MCV 91.1 89.8 92.8  PLT 120* 120* 494*   Basic Metabolic Panel: Recent Labs  Lab 04/07/18 1900 04/08/18 0554  04/09/18 0548 04/09/18 0841  NA 131* 138  --  139  K 3.8 3.7  --  4.0  CL 98 103  --  105  CO2 27 27  --  25  GLUCOSE 156* 144*  --  135*  BUN 16 10  --  10  CREATININE 0.84 0.83 0.86 0.81  CALCIUM 8.9 8.6*  --  8.8*  MG  --   --   --  1.9  PHOS  --   --   --  3.1   GFR: Estimated Creatinine Clearance: 144.3 mL/min (by C-G formula based on SCr of 0.81 mg/dL). Liver Function Tests: Recent Labs  Lab 04/09/18 0841  AST 24  ALT 29  ALKPHOS 48  BILITOT 0.8  PROT 6.8  ALBUMIN 3.6   No results for input(s): LIPASE, AMYLASE in the last 168 hours. No results for input(s): AMMONIA in the last 168 hours. Coagulation Profile: No results for input(s): INR, PROTIME in the last 168 hours. Cardiac Enzymes: No results for input(s): CKTOTAL, CKMB, CKMBINDEX, TROPONINI in the last 168 hours. BNP (last 3 results) No results for input(s): PROBNP in the last 8760 hours. HbA1C: No results for input(s): HGBA1C in the last 72 hours. CBG: Recent Labs  Lab 04/08/18 1155 04/08/18 1644 04/08/18 2118 04/09/18 0738 04/09/18 1117  GLUCAP 132* 103* 97 129* 118*   Lipid Profile: No results for input(s): CHOL, HDL, LDLCALC, TRIG, CHOLHDL, LDLDIRECT in the last 72 hours. Thyroid Function Tests: No results for input(s): TSH, T4TOTAL, FREET4, T3FREE, THYROIDAB in the last 72 hours. Anemia Panel: No results for input(s): VITAMINB12, FOLATE, FERRITIN, TIBC, IRON, RETICCTPCT in the last 72 hours. Sepsis Labs: Recent Labs  Lab 04/07/18 1909 04/08/18 0745  LATICACIDVEN 2.01* 0.9    Recent Results (from the past 240 hour(s))  Culture, blood (routine x 2)     Status: None (Preliminary result)   Collection Time: 04/07/18  6:49 PM  Result Value Ref Range Status   Specimen Description   Final    BLOOD RIGHT ANTECUBITAL Performed at [facility], [address] [place], [place] [address]    Special Requests   Final    BOTTLES DRAWN AEROBIC AND ANAEROBIC Blood Culture adequate  volume Performed at [facility], [address] [place], [place] [address]    Culture   Final    NO GROWTH 2 DAYS Performed at [facility], [address], [place], [place] [address]    Report Status PENDING  Incomplete  Culture, blood (routine x 2)     Status: None (Preliminary result)  Collection Time: 04/07/18  7:00 PM  Result Value Ref Range Status   Specimen Description   Final    BLOOD BLOOD RIGHT HAND Performed at [facility], [address] [place], [place] [address]    Special Requests   Final    BOTTLES DRAWN AEROBIC AND ANAEROBIC Blood Culture adequate volume Performed at [facility], [address] [place], [place] [address]    Culture   Final    NO GROWTH 2 DAYS Performed at [facility], [place] [address] [place], [place] [address]    Report Status PENDING  Incomplete    Radiology Studies: Dg Tibia/fibula Right  Result Date: 04/09/2018 CLINICAL DATA:  Pain EXAM: RIGHT TIBIA AND FIBULA - 2 VIEW COMPARISON:  None. FINDINGS: No acute fracture. No dislocation. No destructive bone lesion. Tricompartment osteoarthritic change in the knee is noted. Soft tissue swelling in the leg is present. IMPRESSION: No acute bony pathology.  Soft tissue swelling is noted. Electronically Signed   By: Marybelle Killings M.D.   On: 04/09/2018 13:37   Mr Foot Right W Wo Contrast  Result Date: 04/09/2018 CLINICAL DATA:  Open wound of the second toe, evaluate for osteomyelitis. EXAM: MRI OF THE RIGHT FOREFOOT WITHOUT AND WITH CONTRAST TECHNIQUE: Multiplanar, multisequence MR imaging of the right forefoot was performed before and after the administration of intravenous contrast. CONTRAST:  10 mL Gadavist COMPARISON:  Foot radiographs from 04/09/2018 FINDINGS: Bones/Joint/Cartilage Marrow edema of the distal phalanx of the right second toe associated with soft tissue cellulitis and ulceration at its tip compatible with osteomyelitis. No frank bone  destruction is identified. There is osteoarthritis of the first MTP joint with reactive subchondral edema and cystic change in addition to the joint space narrowing. Ligaments Noncontributory Muscles and Tendons No intramuscular abscess, hemorrhage or mass. The tendons crossing the forefoot are of normal signal intensity morphology without rupture or tenosynovitis. Soft tissues Diffuse soft tissue edema over the dorsum of the included forefoot and about the second toe. Soft tissue ulceration is described along the tip of the second toe. IMPRESSION: Cellulitis of the forefoot and second toe with tiny soft tissue ulcer off the tip of the toe. Associated marrow abnormality of the distal phalanx of the second toe raises concern for early changes of osteomyelitis. Electronically Signed   By: Ashley Royalty M.D.   On: 04/09/2018 16:07   Dg Foot Complete Right  Result Date: 04/09/2018 CLINICAL DATA:  Toe ulcer, pain EXAM: RIGHT FOOT COMPLETE - 3+ VIEW COMPARISON:  None. FINDINGS: Advanced degenerative changes at the 1st MTP joint. No acute bony abnormality. Specifically, no fracture, subluxation, or dislocation. No radiographic changes of osteomyelitis. IMPRESSION: No acute bony abnormality. Electronically Signed   By: Rolm Baptise M.D.   On: 04/09/2018 13:51   Scheduled Meds: . amLODipine  5 mg Oral Daily  . atorvastatin  20 mg Oral q1800  . enoxaparin (LOVENOX) injection  60 mg Subcutaneous QHS  . hydrochlorothiazide  12.5 mg Oral Daily  . insulin aspart  0-9 Units Subcutaneous TID WC  . irbesartan  150 mg Oral Daily  . mupirocin cream   Topical Daily   Continuous Infusions: . piperacillin-tazobactam (ZOSYN)  IV 3.375 g (04/09/18 1510)  . vancomycin 1,500 mg (04/09/18 0925)    LOS: 2 days   Kerney Elbe, DO [facility] PAGER is on [place]  If 7PM-7AM, please contact night-coverage [email or web address] Password [facility] 04/09/2018, 4:18 PM

## 2018-04-10 DIAGNOSIS — Z87891 Personal history of nicotine dependence: Secondary | ICD-10-CM

## 2018-04-10 DIAGNOSIS — E782 Mixed hyperlipidemia: Secondary | ICD-10-CM

## 2018-04-10 DIAGNOSIS — E1169 Type 2 diabetes mellitus with other specified complication: Secondary | ICD-10-CM

## 2018-04-10 DIAGNOSIS — D61818 Other pancytopenia: Secondary | ICD-10-CM

## 2018-04-10 DIAGNOSIS — L03119 Cellulitis of unspecified part of limb: Secondary | ICD-10-CM

## 2018-04-10 DIAGNOSIS — L84 Corns and callosities: Secondary | ICD-10-CM

## 2018-04-10 DIAGNOSIS — M86171 Other acute osteomyelitis, right ankle and foot: Secondary | ICD-10-CM

## 2018-04-10 DIAGNOSIS — Z6841 Body Mass Index (BMI) 40.0 and over, adult: Secondary | ICD-10-CM

## 2018-04-10 DIAGNOSIS — E1140 Type 2 diabetes mellitus with diabetic neuropathy, unspecified: Secondary | ICD-10-CM

## 2018-04-10 DIAGNOSIS — M869 Osteomyelitis, unspecified: Secondary | ICD-10-CM | POA: Diagnosis present

## 2018-04-10 DIAGNOSIS — L03115 Cellulitis of right lower limb: Secondary | ICD-10-CM

## 2018-04-10 DIAGNOSIS — R600 Localized edema: Secondary | ICD-10-CM

## 2018-04-10 LAB — COMPREHENSIVE METABOLIC PANEL
ALK PHOS: 50 U/L (ref 38–126)
ALT: 31 U/L (ref 0–44)
AST: 26 U/L (ref 15–41)
Albumin: 3.3 g/dL — ABNORMAL LOW (ref 3.5–5.0)
Anion gap: 6 (ref 5–15)
BUN: 13 mg/dL (ref 8–23)
CALCIUM: 9.1 mg/dL (ref 8.9–10.3)
CO2: 28 mmol/L (ref 22–32)
CREATININE: 0.86 mg/dL (ref 0.61–1.24)
Chloride: 106 mmol/L (ref 98–111)
GFR calc non Af Amer: >60 mL/min (ref >60)
GLUCOSE: 132 mg/dL — AB (ref 70–99)
Potassium: 3.8 mmol/L (ref 3.5–5.1)
SODIUM: 140 mmol/L (ref 135–145)
Total Bilirubin: 0.7 mg/dL (ref 0.3–1.2)
Total Protein: 6.3 g/dL — ABNORMAL LOW (ref 6.5–8.1)

## 2018-04-10 LAB — CBC WITH DIFFERENTIAL/PLATELET
BASOS ABS: 0 10*3/uL (ref 0.0–0.1)
BASOS PCT: 0 %
EOS ABS: 0.3 10*3/uL (ref 0.0–0.7)
Eosinophils Relative: 8 %
HEMATOCRIT: 31.4 % — AB (ref 39.0–52.0)
HEMOGLOBIN: 10.9 g/dL — AB (ref 13.0–17.0)
Lymphocytes Relative: 35 %
Lymphs Abs: 1.2 10*3/uL (ref 0.7–4.0)
MCH: 31.2 pg (ref 26.0–34.0)
MCHC: 34.7 g/dL (ref 30.0–36.0)
MCV: 90 fL (ref 78.0–100.0)
Monocytes Absolute: 0.5 10*3/uL (ref 0.1–1.0)
Monocytes Relative: 15 %
NEUTROS PCT: 42 %
Neutro Abs: 1.4 10*3/uL — ABNORMAL LOW (ref 1.7–7.7)
Platelets: 145 10*3/uL — ABNORMAL LOW (ref 150–400)
RBC: 3.49 MIL/uL — ABNORMAL LOW (ref 4.22–5.81)
RDW: 13.3 % (ref 11.5–15.5)
WBC: 3.4 10*3/uL — AB (ref 4.0–10.5)

## 2018-04-10 LAB — GLUCOSE, CAPILLARY
Glucose-Capillary: 135 mg/dL — ABNORMAL HIGH (ref 70–99)
Glucose-Capillary: 125 mg/dL — ABNORMAL HIGH (ref 70–99)
Glucose-Capillary: 106 mg/dL — ABNORMAL HIGH (ref 70–99)

## 2018-04-10 LAB — RETICULOCYTES
RBC.: 3.49 MIL/uL — ABNORMAL LOW (ref 4.22–5.81)
RETIC CT PCT: 1.7 % (ref 0.4–3.1)
Retic Count, Absolute: 59.3 10*3/uL (ref 19.0–186.0)

## 2018-04-10 LAB — IRON AND TIBC
Iron: 65 ug/dL (ref 45–182)
SATURATION RATIOS: 22 % (ref 17.9–39.5)
TIBC: 300 ug/dL (ref 250–450)
UIBC: 235 ug/dL

## 2018-04-10 LAB — MAGNESIUM: Magnesium: 1.9 mg/dL (ref 1.7–2.4)

## 2018-04-10 LAB — FOLATE: Folate: 31.2 ng/mL (ref >5.9)

## 2018-04-10 LAB — SEDIMENTATION RATE: Sed Rate: 60 mm/hr — ABNORMAL HIGH (ref 0–16)

## 2018-04-10 LAB — C-REACTIVE PROTEIN: CRP: 6 mg/dL — AB (ref <1.0)

## 2018-04-10 LAB — VITAMIN B12: Vitamin B-12: 171 pg/mL — ABNORMAL LOW (ref 180–914)

## 2018-04-10 LAB — PHOSPHORUS: PHOSPHORUS: 4.1 mg/dL (ref 2.5–4.6)

## 2018-04-10 LAB — FERRITIN: FERRITIN: 194 ng/mL (ref 24–336)

## 2018-04-10 MED ORDER — MUPIROCIN CALCIUM 2 % EX CREA: TOPICAL_CREAM | Freq: Every day | CUTANEOUS | 0 refills | Status: DC

## 2018-04-10 MED ORDER — CYANOCOBALAMIN 1000 MCG PO TABS: 1000.0000 ug | ORAL_TABLET | Freq: Every day | ORAL | 0 refills | Status: DC

## 2018-04-10 MED ORDER — AMOXICILLIN-POT CLAVULANATE 875-125 MG PO TABS
1.0000 | ORAL_TABLET | Freq: Two times a day (BID) | ORAL | Status: DC
  Administered 2018-04-10: 1 via ORAL
  Filled 2018-04-10: qty 1

## 2018-04-10 MED ORDER — AMOXICILLIN-POT CLAVULANATE 875-125 MG PO TABS: 1.0000 | ORAL_TABLET | Freq: Two times a day (BID) | ORAL | 0 refills | Status: DC

## 2018-04-10 MED ORDER — VITAMIN B-12 1000 MCG PO TABS
1000.0000 ug | ORAL_TABLET | Freq: Every day | ORAL | Status: DC
  Administered 2018-04-10: 1000 ug via ORAL
  Filled 2018-04-10: qty 1

## 2018-04-10 NOTE — Consult Note (Addendum)
   [facility] CM Inpatient Consult   04/10/2018  Robert [name] [birth year] [id number]    Patient screened for potential [facility] Care Management services due to borderline (medium/high)  unplanned readmission risk score of 21% (medium).  Went to bedside to speak with [name] about [facility] Care Management services. He is agreeable and [facility] Care Management written consent obtained. States "my wife would say I need this service". [facility] folder provided.  [name] endorses his Primary Care Provider is Dr. Sharee Holster.  Confirmed best contact number for [name] as [phone]. He lives with wife. [name] denies having concerns with transportation to MD appointments. States his PCP office is about 2 miles from his home.   [name] states he is interested in coming off of his blood pressure medicine. States my BP has been lower and I wonder if I still need it. He states he does not have a blood pressure cuff at home to keep record of it however. Encouraged [name] to obtain a blood  pressure cuff and keep a log of his blood pressures so he can review with his PCP. [name] states he still  would like a [facility] Pharmacy referral. States " I rather take as little medication as possible if I don't need it".  Will make referral to [facility] for transition of care and [facility] Pharmacist for medication review.  Notification sent to inpatient RNCM to make aware [facility] will follow post discharge.    Raiford Noble, MSN-Ed, RN,BSN [facility] Liaison [phone]

## 2018-04-10 NOTE — Consult Note (Signed)
[facility]    Date of Admission:  04/07/2018           Day 4 vancomycin        Day 4 piperacillin tazobactam       Reason for Consult: Right second toe osteomyelitis and recurrent right leg cellulitis   Referring Provider: Dr. Marguerita Merles Primary Care Provider: Dr. Thomasene Lot  Assessment: He has recurrent cellulitis of his right lower leg and early osteomyelitis of the distal tuft of his right second toe.  He is improving on empiric antibiotics.  I do not feel that he needs orthopedic evaluation biopsy.  I will continue treatment with oral amoxicillin clavulanate and arrange to see him back in clinic next week.  He also has pancytopenia.  His HIV antibody was -92-month ago.  I will screen him for hepatitis C.  Plan: 1. Change antibiotics to oral amoxicillin clavulanate 875 mg x 4 weeks 2. Sed rate, C-reactive protein and hepatitis C antibody 3. He is okay for discharge today from my standpoint  Principal Problem:   Osteomyelitis of second toe of right foot (HCC) Active Problems:   Recurrent cellulitis of lower extremity   Morbid obesity BMI >er 48   OSA on CPAP- seen Dr Sherene Sires in past   Bilateral lower extremity pitting edema   Barrett's esophagus   Type 2 diabetes mellitus with complication, without long-term current use of insulin (HCC)   Essential hypertension   Mixed diabetic hyperlipidemia associated with type 2 diabetes mellitus (HCC)   DJD (degenerative joint disease)   Pancytopenia (HCC)   Chronic neuropathy of both feet-  due to lumbar radiculopathy as well as diabetic neuropathy   Scheduled Meds: . amLODipine  5 mg Oral Daily  . atorvastatin  20 mg Oral q1800  . enoxaparin (LOVENOX) injection  60 mg Subcutaneous QHS  . hydrochlorothiazide  12.5 mg Oral Daily  . insulin aspart  0-9 Units Subcutaneous TID WC  . irbesartan  150 mg Oral Daily  . mupirocin cream   Topical Daily   Continuous Infusions: .  piperacillin-tazobactam (ZOSYN)  IV 3.375 g (04/10/18 0543)  . vancomycin 1,500 mg (04/10/18 0951)   PRN Meds:.acetaminophen **OR** acetaminophen, albuterol, butalbital-acetaminophen-caffeine, cyclobenzaprine, HYDROcodone-acetaminophen, meloxicam, naphazoline-pheniramine, ondansetron **OR** ondansetron (ZOFRAN) IV, traMADol  HPI: Robert [name] is a 62 y.o. male with morbid obesity and recently diagnosed type 2 diabetes.  He has had a chronic callus on his right second toe.  During the third week of August he and his family spent a long day at a water park.  His daughter noticed that his right foot was bleeding and he found that the callus had come off leaving a deep ulcer over the tip of his toe.  He has had intermittent clear drainage since that time.  He was admitted to the hospital from 03/14/2018 -03/17/2018 with right lower leg cellulitis.  He was discharged on a short course of oral cephalexin and doxycycline.  His cellulitis resolved but he was left with more swelling of his right lower leg than usual.  He began to develop recurrent fevers up to 103 degrees and was readmitted on 04/06/2018 with recurrent right leg cellulitis.  He was also noted to have redness and swelling of his right second toe.  MRI scan was done which showed marrow edema of the distal phalanx.  No bony destruction was seen.   Review of Systems: Review of Systems  Constitutional: Positive for  chills, fever and weight loss. Negative for diaphoresis.  Gastrointestinal: Negative for abdominal pain, diarrhea, nausea and vomiting.  Musculoskeletal: Positive for back pain and joint pain.    Past Medical History:  Diagnosis Date  . Allergic rhinitis due to pollen   . Arthritis   . Asthma   . Back pain   . Barrett esophagus   . Depressive disorder   . Diabetes mellitus type 2 in obese (HCC)   . Exposure to hepatitis B   . Exposure to hepatitis C   . Narcotic abuse (HCC)    pain medications  . OSA (obstructive sleep apnea)      on CPAP     Social History   Tobacco Use  . Smoking status: Former Smoker    Packs/day: 0.80    Years: 15.00    Pack years: 12.00    Types: Cigarettes    Last attempt to quit: 07/16/2004    Years since quitting: 13.7  . Smokeless tobacco: Never Used  Substance Use Topics  . Alcohol use: No  . Drug use: No    Comment: Prior abuse of narcotics    Family History  Problem Relation Age of Onset  . Allergies Mother   . Early death Father   . Thyroid disease Sister   . Colon cancer Neg Hx   . Stomach cancer Neg Hx    No Known Allergies  OBJECTIVE: Blood pressure (!) 88/50, pulse (!) 58, temperature 97.6 F (36.4 C), temperature source Oral, resp. rate 17, height 6\' 1"  (1.854 m), weight (!) 156.9 kg, SpO2 97 %.  Physical Exam  Constitutional:  He was up walking in the hall when I came to see him.  He is in good spirits.  Musculoskeletal:  He has edema both lower legs.  His right lower leg cellulitis has receded from the inked borders placed on admission.  He has an 8 mm ulcer on the tip of his right second toe surrounded by callus.  There is no exposed bone.  He has cellulitis of his right second toe.    Lab Results Lab Results  Component Value Date   WBC 3.4 (L) 04/10/2018   HGB 10.9 (L) 04/10/2018   HCT 31.4 (L) 04/10/2018   MCV 90.0 04/10/2018   PLT 145 (L) 04/10/2018    Lab Results  Component Value Date   CREATININE 0.86 04/10/2018   BUN 13 04/10/2018   NA 140 04/10/2018   K 3.8 04/10/2018   CL 106 04/10/2018   CO2 28 04/10/2018    Lab Results  Component Value Date   ALT 31 04/10/2018   AST 26 04/10/2018   ALKPHOS 50 04/10/2018   BILITOT 0.7 04/10/2018     Microbiology: Recent Results (from the past 240 hour(s))  Culture, blood (routine x 2)     Status: None (Preliminary result)   Collection Time: 04/07/18  6:49 PM  Result Value Ref Range Status   Specimen Description   Final    BLOOD RIGHT ANTECUBITAL Performed at [facility], [address] [place], [place] [address]    Special Requests   Final    BOTTLES DRAWN AEROBIC AND ANAEROBIC Blood Culture adequate volume Performed at [facility], [address] [place], [place] [address]    Culture   Final    NO GROWTH 3 DAYS Performed at [facility], [address] [place], [place] [address]    Report Status PENDING  Incomplete  Culture, blood (routine x 2)     Status: None (Preliminary result)   Collection Time: 04/07/18  7:00 PM  Result Value Ref Range Status   Specimen Description   Final    BLOOD BLOOD RIGHT HAND Performed at [facility], [address] [place], [place] [address]    Special Requests   Final    BOTTLES DRAWN AEROBIC AND ANAEROBIC Blood Culture adequate volume Performed at [facility], [address] [place], [place] [address]    Culture   Final    NO GROWTH 3 DAYS Performed at [facility], [address] [place], [place] [address]    Report Status PENDING  Incomplete    Cliffton Asters, MD [facility] [phone] pager   [phone] cell 04/10/2018, 11:51 AM

## 2018-04-10 NOTE — Progress Notes (Signed)
Patient given discharge instructions, and verbalized an understanding of all discharge instructions.  Patient agrees with discharge plan, and is being discharged in stable medical condition.  Patient given transportation via wheelchair. 

## 2018-04-10 NOTE — Discharge Summary (Signed)
Physician Discharge Summary  Caswell D [name] URK:[id number] DOB: [birth year] DOA: 04/07/2018  PCP: [name], DO  Admit date: 04/07/2018 Discharge date: 04/10/2018  Admitted From: Home Disposition: Home  Recommendations for Outpatient Follow-up:  1. Follow up with PCP in 1-2 weeks 2. Follow up with Infectious Diseases Dr. Megan Salon within 1 week 3. Have an ABI done as an outpatient as was not able to be done in Hospital  4. Follow up with Podiatry as an outpatient  5. Please obtain CMP/CBC, Mag, Phos in one week 6. Please follow up on the following pending results: ESR, CRP, Hep C  Home Health: No Equipment/Devices: None  Discharge Condition: Stable CODE STATUS: FULL CODE Diet recommendation: Heart Healthy Carb Modified Diet  Brief/Interim Summary: The patient is a 47 with history of diabetes mellitus type 2, obstructive sleep apnea on CPAP, Barrett's esophagus and GERD came to the hospital for evaluation of right lower extremity pain concerning for cellulitis. He was diagnosed with right lower extremity cellulitis about 3-4 weeks ago and was discharged on doxycycline. After going home he stated he never really felt better or this resolved therefore returns back with the symptoms. Also noted to have an ulcer on Right 2nd toe. He was treated with IV Abx for his recurrent leg cellulitis  and obtained an MRI to r/o Osteomyelitis which in fact did show early changes of ostium myelitis.  Infectious disease was consulted and evaluated the patient and recommended changing IV antibiotics to p.o. Augmentin.  Patient was deemed medically stable to be discharged at this time will need to follow-up with primary care physician as well as infectious disease as well as podiatry.  Unfortunately ABI was not able be done in the hospital so we will need to have an ABI done as an outpatient and have recommended the PCP follow-up on this.  Discharge Diagnoses:  Principal Problem:   Osteomyelitis of second  toe of right foot ([place]) Active Problems:   Morbid obesity BMI >er 48   OSA on CPAP- seen Dr Melvyn Novas in past   Bilateral lower extremity pitting edema   Barrett's esophagus   Type 2 diabetes mellitus with complication, without long-term current use of insulin (HCC)   Essential hypertension   Mixed diabetic hyperlipidemia associated with type 2 diabetes mellitus (HCC)   DJD (degenerative joint disease)   Pancytopenia (HCC)   Chronic neuropathy of both feet-  due to lumbar radiculopathy as well as diabetic neuropathy   Recurrent cellulitis of lower extremity  Sepsis secondary to Right Lower Extremity Cellulitis Right second toe small wound concerning for Early Osteomyelitis  -Patient is currently hemodynamically stable and doing better as sepsis physiology has improved.  -Blood Cx x2 showed NGTD at 3 days -Lactate has trended down  -Bilateral lower extremity Dopplers to rule out any DVT was negative for DVT in the preliminary report -ID consulted for further evaluation and recommendations -Was on IV Vancomycin and Zosyn and de-escalated to Augmentin by ID  -Wound care consult has been ordered and recommending Bactroban -Obtain Foot Imaging with DG Foot X-Ray Complete, MRI of Foot to r/o Osteomyelitis, and DG Tibia/Fibula X-Ray of Cellulitic Area -DG Foot Xray showed: Advanced degenerative changes at the 1st MTP joint. No acute bony abnormality. Specifically, no fracture, subluxation, or dislocation. No radiographic changes of osteomyelitis. -DG Tibia/Fibula showed: No acute fracture. No dislocation. No destructive bone lesion. Tricompartment osteoarthritic change in the knee is noted. Soft tissue swelling in the leg is present. -MRI Foot showed: Cellulitis of the  forefoot and second toe with tiny soft tissue ulcer off the tip of the toe. Associated marrow abnormality of the distal phalanx of the second toe raises concern for early changes of osteomyelitis. -Continue Leg Elevation; Compression  Stockings  -ESR was 45; ID repeating and also obtaining an CRP -Obtain ABI as well given diminished Pulse (likely from swelling) but can be done as an outpatient  -C/w Meloxicam 15 mg po Daily prn and Cyclobenzaprine 10 mg po TIDprn, Tramadol 100 mg po TID prn Moderate Pain, Hydrocodone-Acetaminophen 1 tab po q6hprn Severe Pain -Patient has Follow up with Podiatry as an outpatient and recommending keeping appointment -Per ID, they do not feel as if an Orthopedic Evaluation is currently necessary for Biopsy -Treatment for 4 weeks with Augmentin per ID -Follow up with PCP and ID as an outpatient   Diabetes Mellitus Type 2, obese -Holding Home Regimen at this time with Metformin  -Hemoglobin A1c was 5.8 on 03/17/2018 -C/w Sensitive Novolog SSI AC -C/w Hypoglycemic -CBG's ranging from 106-154  Essential Hypertension -C/w Home Regimen with Omlesartan-Amlodipine daily, Hydrochlorthiazide 12.5 mg p.o. daily  Obstructive Sleep Apnea -C/w Home CPAP  Hyperlipidemia -Continue Atorvastatin 20 mg po qHS  Hx of Barrett's Esophagus/GERD -Not on any PPI per MAR  Hyponatremia -Resolved -Continue to Monitor CMP  Normocytic Anemia -Patient's hemoglobin/hematocrit went from 11.9/33.9 -> 11.7/34.6 -> 10.9/31.4 -Checked Anemia Panel showed an iron level 65, U IBC of 235, TIBC 300, saturation ratio 22%, ferritin level 194, folate level of 31.2, vitamin B12 level of 171 -Started Vitamin B12 Supplementation with 1,000 mcg Daily  -Continue monitor for signs and symptoms of bleeding -Repeat CBC as an outpatient   Thrombocytopenia -Likely Reactive in the setting of Infection -Platelet Count went from 120 -> 135 -> 145 -Continue to Monitor for S/Sx of Bleeding -Repeat CBC as an outpatient   Pancytopenia -As Above -Continue to Monitor; Repeat CBC within 1 week -Follow up Care per PCP  Discharge Instructions  Discharge Instructions    Call MD for:  difficulty breathing, headache or  visual disturbances   Complete by:  As directed    Call MD for:  extreme fatigue   Complete by:  As directed    Call MD for:  hives   Complete by:  As directed    Call MD for:  persistant dizziness or light-headedness   Complete by:  As directed    Call MD for:  persistant nausea and vomiting   Complete by:  As directed    Call MD for:  redness, tenderness, or signs of infection (pain, swelling, redness, odor or green/yellow discharge around incision site)   Complete by:  As directed    Call MD for:  severe uncontrolled pain   Complete by:  As directed    Call MD for:  temperature >100.4   Complete by:  As directed    Diet - low sodium heart healthy   Complete by:  As directed    Diet Carb Modified   Complete by:  As directed    Discharge instructions   Complete by:  As directed    You were cared for by a hospitalist during your hospital stay. If you have any questions about your discharge medications or the care you received while you were in the hospital after you are discharged, you can call the unit and ask to speak with the hospitalist on call if the hospitalist that took care of you is not available. Once you are discharged, your  primary care physician will handle any further medical issues. Please note that NO REFILLS for any discharge medications will be authorized once you are discharged, as it is imperative that you return to your primary care physician (or establish a relationship with a primary care physician if you do not have one) for your aftercare needs so that they can reassess your need for medications and monitor your lab values.  Follow up with PCP and Infectious Diseases within [redacted] week along with Podiatry. Take all medications as prescribed. If symptoms change or worsen please return to the ED for evaluation   Increase activity slowly   Complete by:  As directed      Allergies as of 04/10/2018   No Known Allergies     Medication List    TAKE these medications    albuterol 108 (90 Base) MCG/ACT inhaler Commonly known as:  PROVENTIL HFA;VENTOLIN HFA Inhale 2 puffs into the lungs every 6 (six) hours as needed for wheezing or shortness of breath.   amoxicillin-clavulanate 875-125 MG tablet Commonly known as:  AUGMENTIN Take 1 tablet by mouth every 12 (twelve) hours for 28 days.   APPLE CIDER VINEGAR PO Take 15 mLs by mouth 2 (two) times daily.   atorvastatin 20 MG tablet Commonly known as:  LIPITOR TAKE 1 TABLET BY MOUTH EVERYDAY AT BEDTIME What changed:  See the new instructions.   B COMPLEX 100 PO Take 1 capsule by mouth every other day.   blood glucose meter kit and supplies Dispense based on patient and insurance preference. Use to check glucose fasting in the AM and then after largest meal of the day.. (FOR ICD-10 E10.9, E11.9).   blood glucose meter kit and supplies Kit Dispense based on patient and insurance preference. Use up to four times daily as directed. (FOR ICD-9 250.00, 250.01).   butalbital-acetaminophen-caffeine 50-325-40 MG tablet Commonly known as:  FIORICET, ESGIC Take 1 tablet by mouth every 6 (six) hours as needed for headache.   cyclobenzaprine 10 MG tablet Commonly known as:  FLEXERIL TAKE 1 TABLET BY MOUTH TWICE A DAY What changed:    when to take this  reasons to take this   glucose blood test strip Use to test blood sugar, test in the morning fasting and test after largest meal of the day.   hydrochlorothiazide 12.5 MG capsule Commonly known as:  MICROZIDE Take 1 capsule (12.5 mg total) by mouth daily.   meloxicam 15 MG tablet Commonly known as:  MOBIC Take 1 tablet (15 mg total) by mouth daily. What changed:    when to take this  reasons to take this   metFORMIN 500 MG tablet Commonly known as:  GLUCOPHAGE Take 2 tablets (1,000 mg total) by mouth 2 (two) times daily with a meal.   mupirocin cream 2 % Commonly known as:  BACTROBAN Apply topically daily. Start taking on:  04/11/2018    naphazoline-pheniramine 0.025-0.3 % ophthalmic solution Commonly known as:  NAPHCON-A Place 1 drop into both eyes 4 (four) times daily as needed for eye irritation.   Olmesartan-amLODIPine-HCTZ 20-5-12.5 MG Tabs Take 1 tablet by mouth daily.   ONE TOUCH ULTRA MINI w/Device Kit Use to test blood sugar, test in the morning fasting and test after largest meal of the day.   ONETOUCH DELICA LANCETS 49E Misc Use for testing blood sugar, test once in the morning fasting and test after largest meal of the day.   Potassium 75 MG Tabs Take 150 mg by mouth every other  day.   traMADol 50 MG tablet Commonly known as:  ULTRAM Take 100 mg by mouth 3 (three) times daily. For pain   vitamin C 500 MG tablet Commonly known as:  ASCORBIC ACID Take 500 mg by mouth daily.   Vitamin D (Ergocalciferol) 50000 units Caps capsule Commonly known as:  DRISDOL TAKE 1 CAPSULE WEEKLY What changed:  when to take this   Vitamin D3 5000 units Tabs 5,000 IU OTC vitamin D3 daily. What changed:    how much to take  how to take this  when to take this  additional instructions   VITAMIN E PO Take 1 tablet by mouth daily.   zinc gluconate 50 MG tablet Take 50 mg by mouth every other day.       No Known Allergies  Consultations:  Infectious Diseases Dr. Megan Salon  Procedures/Studies: Dg Chest 2 View  Result Date: 04/06/2018 CLINICAL DATA:  Sore throat and fever. EXAM: CHEST - 2 VIEW COMPARISON:  03/14/2018 FINDINGS: The lungs are clear without focal pneumonia, edema, pneumothorax or pleural effusion. The cardiopericardial silhouette is within normal limits for size. The visualized bony structures of the thorax are intact. IMPRESSION: No active cardiopulmonary disease. Electronically Signed   By: Misty Stanley M.D.   On: 04/06/2018 19:53   Dg Tibia/fibula Right  Result Date: 04/09/2018 CLINICAL DATA:  Pain EXAM: RIGHT TIBIA AND FIBULA - 2 VIEW COMPARISON:  None. FINDINGS: No acute fracture. No  dislocation. No destructive bone lesion. Tricompartment osteoarthritic change in the knee is noted. Soft tissue swelling in the leg is present. IMPRESSION: No acute bony pathology.  Soft tissue swelling is noted. Electronically Signed   By: Marybelle Killings M.D.   On: 04/09/2018 13:37   Dg Tibia/fibula Right  Result Date: 03/15/2018 CLINICAL DATA:  Cellulitis. EXAM: RIGHT TIBIA AND FIBULA - 2 VIEW COMPARISON:  None. FINDINGS: There is no evidence of fracture or other focal bone lesions. Tricompartmental degenerative changes of the knee. Mild soft tissue swelling of the lower leg. IMPRESSION: Soft tissue swelling.  No acute osseous abnormality. Electronically Signed   By: Titus Dubin M.D.   On: 03/15/2018 10:21   Ct Head Wo Contrast  Result Date: 03/15/2018 CLINICAL DATA:  Frontal headache since yesterday.  Normal neuro exam EXAM: CT HEAD WITHOUT CONTRAST TECHNIQUE: Contiguous axial images were obtained from the base of the skull through the vertex without intravenous contrast. COMPARISON:  09/22/2011 FINDINGS: Brain: No evidence of acute infarction, hemorrhage, hydrocephalus, extra-axial collection or mass lesion/mass effect. Vascular: No hyperdense vessel or unexpected calcification. Skull: Normal. Negative for fracture or focal lesion. Sinuses/Orbits: No acute finding. IMPRESSION: No acute finding or explanation for headache. Electronically Signed   By: Monte Fantasia M.D.   On: 03/15/2018 10:06   Mr Foot Right W Wo Contrast  Result Date: 04/09/2018 CLINICAL DATA:  Open wound of the second toe, evaluate for osteomyelitis. EXAM: MRI OF THE RIGHT FOREFOOT WITHOUT AND WITH CONTRAST TECHNIQUE: Multiplanar, multisequence MR imaging of the right forefoot was performed before and after the administration of intravenous contrast. CONTRAST:  10 mL Gadavist COMPARISON:  Foot radiographs from 04/09/2018 FINDINGS: Bones/Joint/Cartilage Marrow edema of the distal phalanx of the right second toe associated with  soft tissue cellulitis and ulceration at its tip compatible with osteomyelitis. No frank bone destruction is identified. There is osteoarthritis of the first MTP joint with reactive subchondral edema and cystic change in addition to the joint space narrowing. Ligaments Noncontributory Muscles and Tendons No intramuscular abscess, hemorrhage or  mass. The tendons crossing the forefoot are of normal signal intensity morphology without rupture or tenosynovitis. Soft tissues Diffuse soft tissue edema over the dorsum of the included forefoot and about the second toe. Soft tissue ulceration is described along the tip of the second toe. IMPRESSION: Cellulitis of the forefoot and second toe with tiny soft tissue ulcer off the tip of the toe. Associated marrow abnormality of the distal phalanx of the second toe raises concern for early changes of osteomyelitis. Electronically Signed   By: Ashley Royalty M.D.   On: 04/09/2018 16:07   Dg Chest Port 1 View  Result Date: 03/15/2018 CLINICAL DATA:  Fever nausea beginning this morning. History of asthma. EXAM: PORTABLE CHEST 1 VIEW COMPARISON:  Chest radiograph September 27, 2015 FINDINGS: Cardiomediastinal silhouette is normal. No pleural effusions or focal consolidations. Trachea projects midline and there is no pneumothorax. Soft tissue planes and included osseous structures are non-suspicious. Old LEFT clavicle fracture. IMPRESSION: No active disease. Electronically Signed   By: Elon Alas M.D.   On: 03/15/2018 00:48   Dg Foot Complete Right  Result Date: 04/09/2018 CLINICAL DATA:  Toe ulcer, pain EXAM: RIGHT FOOT COMPLETE - 3+ VIEW COMPARISON:  None. FINDINGS: Advanced degenerative changes at the 1st MTP joint. No acute bony abnormality. Specifically, no fracture, subluxation, or dislocation. No radiographic changes of osteomyelitis. IMPRESSION: No acute bony abnormality. Electronically Signed   By: Rolm Baptise M.D.   On: 04/09/2018 13:51   Dg Foot Complete  Right  Result Date: 03/15/2018 CLINICAL DATA:  Infected foot abrasion EXAM: RIGHT FOOT COMPLETE - 3+ VIEW COMPARISON:  05/10/2009 FINDINGS: No opaque foreign body, fracture, or erosion. No definite soft tissue gas, and definitely none seen tracking along fascial planes. Severe first TMT osteoarthritis with progression from prior. IMPRESSION: 1. No opaque foreign body or acute osseous finding. 2. Severe first TMT osteoarthritis. Electronically Signed   By: Monte Fantasia M.D.   On: 03/15/2018 10:20   LE VENOUS DUPLEX -Negative for DVT  LE ARTERIAL DUPLEX with ABI -Will need to have done as an outpatient   Subjective: Seen and examined at bedside patient was doing better.  Thinks the redness has improved however still very sore to touch.  Does not think it is is warm.  Foot remains swollen along with leg.  Patient states he is trying to elevate it.  No chest pain, lightheadedness or dizziness. No other complaints or concern and ready to go home.  Discharge Exam: Vitals:   04/10/18 0423 04/10/18 0634  BP: (!) 91/38 (!) 88/50  Pulse: (!) 58   Resp: 17   Temp:    SpO2: 97%    Vitals:   04/09/18 1727 04/09/18 2113 04/10/18 0423 04/10/18 0634  BP: 118/71 119/60 (!) 91/38 (!) 88/50  Pulse:  79 (!) 58   Resp: _0 Temp: 98.2 F (36.8 C) 97.6 F (36.4 C)    TempSrc: Oral Oral    SpO2: 98% 98% 97%   Weight:      Height:       General: Pt is alert, awake, not in acute distress Cardiovascular: RRR, S1/S2 +, no rubs, no gallops Respiratory: Diminished bilaterally, no wheezing, no rhonchi Abdominal: Soft, NT, Distended 2/2 body habitus, bowel sounds + Extremities: 1-2+ LE edema, no cyanosis; Warmth and blistering and Right 2nd Toe Ulceration   The results of significant diagnostics from this hospitalization (including imaging, microbiology, ancillary and laboratory) are listed below for reference.    Microbiology:  Recent Results (from the past 240 hour(s))  Culture, blood (routine  x 2)     Status: None (Preliminary result)   Collection Time: 04/07/18  6:49 PM  Result Value Ref Range Status   Specimen Description   Final    BLOOD RIGHT ANTECUBITAL Performed at [facility], [address] [place], [place] [address]    Special Requests   Final    BOTTLES DRAWN AEROBIC AND ANAEROBIC Blood Culture adequate volume Performed at [facility], [address] [place], [place] [address]    Culture   Final    NO GROWTH 3 DAYS Performed at [facility], [place] [address] [place], [place] [address]    Report Status PENDING  Incomplete  Culture, blood (routine x 2)     Status: None (Preliminary result)   Collection Time: 04/07/18  7:00 PM  Result Value Ref Range Status   Specimen Description   Final    BLOOD BLOOD RIGHT HAND Performed at [facility], [address] [place], [place] [address]    Special Requests   Final    BOTTLES DRAWN AEROBIC AND ANAEROBIC Blood Culture adequate volume Performed at [facility], [address] [place], [place] [address]    Culture   Final    NO GROWTH 3 DAYS Performed at [facility], [place] [address] [place], [place] [address]    Report Status PENDING  Incomplete    Labs: BNP (last 3 results) No results for input(s): BNP in the last 8760 hours. Basic Metabolic Panel: Recent Labs  Lab 04/07/18 1900 04/08/18 0554 04/09/18 0548 04/09/18 0841 04/10/18 0539  NA 131* 138  --  139 140  K 3.8 3.7  --  4.0 3.8  CL 98 103  --  105 106  CO2 27 27  --  25 28  GLUCOSE 156* 144*  --  135* 132*  BUN 16 10  --  10 13  CREATININE 0.84 0.83 0.86 0.81 0.86  CALCIUM 8.9 8.6*  --  8.8* 9.1  MG  --   --   --  1.9 1.9  PHOS  --   --   --  3.1 4.1   Liver Function Tests: Recent Labs  Lab 04/09/18 0841 04/10/18 0539  AST 24 26  ALT 29 31  ALKPHOS 48 50  BILITOT 0.8 0.7  PROT 6.8 6.3*  ALBUMIN 3.6 3.3*   No results for input(s): LIPASE, AMYLASE in the last 168 hours. No  results for input(s): AMMONIA in the last 168 hours. CBC: Recent Labs  Lab 04/07/18 1900 04/08/18 0554 04/09/18 0841 04/10/18 0539  WBC 7.7 6.3 4.3 3.4*  NEUTROABS 6.4  --  2.7 1.4*  HGB 11.9* 11.1* 11.7* 10.9*  HCT 33.9* 31.8* 34.6* 31.4*  MCV 91.1 89.8 92.8 90.0  PLT 120* 120* 135* 145*   Cardiac Enzymes: No results for input(s): CKTOTAL, CKMB, CKMBINDEX, TROPONINI in the last 168 hours. BNP: Invalid input(s): POCBNP CBG: Recent Labs  Lab 04/09/18 1117 04/09/18 1655 04/09/18 2200 04/10/18 0729 04/10/18 1202  GLUCAP 118* 154* 135* 125* 106*   D-Dimer No results for input(s): DDIMER in the last 72 hours. Hgb A1c No results for input(s): HGBA1C in the last 72 hours. Lipid Profile No results for input(s): CHOL, HDL, LDLCALC, TRIG, CHOLHDL, LDLDIRECT in the last 72 hours. Thyroid function studies No results for input(s): TSH, T4TOTAL, T3FREE, THYROIDAB in the last 72  hours.  Invalid input(s): FREET3 Anemia work up Recent Labs    04/10/18 0539 04/10/18 0616  VITAMINB12  --  171*  FOLATE  --  31.2  FERRITIN  --  194  TIBC  --  300  IRON  --  65  RETICCTPCT 1.7  --    Urinalysis    Component Value Date/Time   COLORURINE YELLOW 03/15/2018 0235   APPEARANCEUR CLEAR 03/15/2018 0235   LABSPEC 1.021 03/15/2018 0235   PHURINE 5.0 03/15/2018 0235   GLUCOSEU NEGATIVE 03/15/2018 0235   HGBUR NEGATIVE 03/15/2018 0235   BILIRUBINUR NEGATIVE 03/15/2018 0235   KETONESUR NEGATIVE 03/15/2018 0235   PROTEINUR NEGATIVE 03/15/2018 0235   UROBILINOGEN 0.2 05/31/2012 1658   NITRITE NEGATIVE 03/15/2018 0235   LEUKOCYTESUR NEGATIVE 03/15/2018 0235   Sepsis Labs Invalid input(s): PROCALCITONIN,  WBC,  LACTICIDVEN Microbiology Recent Results (from the past 240 hour(s))  Culture, blood (routine x 2)     Status: None (Preliminary result)   Collection Time: 04/07/18  6:49 PM  Result Value Ref Range Status   Specimen Description   Final    BLOOD RIGHT ANTECUBITAL Performed  at [facility], [address] [place], [place] [address]    Special Requests   Final    BOTTLES DRAWN AEROBIC AND ANAEROBIC Blood Culture adequate volume Performed at [facility], [address] [place], [place] [address]    Culture   Final    NO GROWTH 3 DAYS Performed at [facility], [place] [address], [place], [place] [address]    Report Status PENDING  Incomplete  Culture, blood (routine x 2)     Status: None (Preliminary result)   Collection Time: 04/07/18  7:00 PM  Result Value Ref Range Status   Specimen Description   Final    BLOOD BLOOD RIGHT HAND Performed at [facility], [address] [place], [place] [address]    Special Requests   Final    BOTTLES DRAWN AEROBIC AND ANAEROBIC Blood Culture adequate volume Performed at [facility], [address] [place], [place] [address]    Culture   Final    NO GROWTH 3 DAYS Performed at [facility], [place] [address], [place], [place] [address]    Report Status PENDING  Incomplete   Time coordinating discharge: 35 minutes  SIGNED:  Kerney Elbe, DO [facility] 04/10/2018, 12:35 PM Pager is on [place]  If 7PM-7AM, please contact night-coverage [email or web address] Password [email or web address]

## 2018-04-10 NOTE — Progress Notes (Signed)
[facility]  [facility] Infusion Coordinator will follow pt with ID team to support home infusion pharmacy services at DC if needed.  If patient discharges after hours, please call [phone].   Sedalia Muta 04/10/2018, 10:12 AM

## 2018-04-11 ENCOUNTER — Other Ambulatory Visit: Payer: Self-pay | Admitting: Pharmacist

## 2018-04-11 ENCOUNTER — Other Ambulatory Visit: Payer: Self-pay

## 2018-04-11 NOTE — Patient Outreach (Signed)
Transition of care: Discharge from [facility] on 04/10/2018  Placed call to patient with no answer. Left a message requesting a call back.  PLAN: will mail letter and reattempt in 3 days.  Rowe Pavy, RN, BSN, CEN [facility] [employer] [phone]

## 2018-04-11 NOTE — Patient Outreach (Addendum)
[facility]) Care Management  04/11/2018  Robert [name] 08-04-1955 [id number]   Patient was called for post discharge medication review. Unfortunately, he did not answer the phone.  HIPAA compliant message was left on the patient's voicemail.  Plan: Schedule patient with Haynes Hoehn, PharmD as I was covering [place] today and will be on PAL and out for the AUDIT for the next few days.   Beecher Mcardle, PharmD, BCACP [facility] Clinical Pharmacist [phone]

## 2018-04-12 LAB — CULTURE, BLOOD (ROUTINE X 2)
Culture: NO GROWTH
Culture: NO GROWTH
SPECIAL REQUESTS: ADEQUATE
Special Requests: ADEQUATE

## 2018-04-14 LAB — HEPATITIS C ANTIBODY (REFLEX): HCV Ab: 1.8 s/co ratio — ABNORMAL HIGH (ref 0.0–0.9)

## 2018-04-14 LAB — HCV AB VERIFICATION: HCV ANTIBODY VERIFICATION: REACTIVE — AB

## 2018-04-14 LAB — COMMENT3 - HEP PANEL

## 2018-04-16 ENCOUNTER — Other Ambulatory Visit: Payer: Self-pay | Admitting: Pharmacist

## 2018-04-16 ENCOUNTER — Telehealth: Payer: Self-pay | Admitting: Behavioral Health

## 2018-04-16 ENCOUNTER — Other Ambulatory Visit: Payer: Self-pay

## 2018-04-16 DIAGNOSIS — R768 Other specified abnormal immunological findings in serum: Secondary | ICD-10-CM | POA: Insufficient documentation

## 2018-04-16 NOTE — Patient Outreach (Signed)
Transition of care:  Placed 2nd outreach attempt to patient. Patient answered and identified himself. Patient reports that he is doing well but is concerned about increase redness to foot.  Reports he call Dr. Blair Dolphin office and will be seen tomorrow. Denies fever at this time.   PLAN: offered home visit to assess needs and patient agreed to home visit tomorrow on 04/17/2018. Provided my contact information and confirmed address.  Rowe Pavy, RN, BSN, CEN [facility] [employer] [phone]

## 2018-04-16 NOTE — Patient Outreach (Signed)
[facility]) Care Management  [name]   04/16/2018  Robert [name] 1955-10-26 [id number]   62 y.o. year old male referred to [place] for medication reconciliation post discharge.     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   Subjective:  Successful call to Robert [name] today.  HIPAA identifiers verified. Patient agreeable to review medications.  He reports that he has been taking his antibiotic appropriately but his RLE has started to have increased swelling and redness.  He reports no fever.  He states he spoke with ID clinic regarding symptoms and will be seen tomorrow as scheduled for f/u with Dr. Megan Salon.    Objective: Hemoglobin A1C = 5.8 (9/2/'19)   Medications Reviewed Today    Reviewed by Dorothy Puffer, CPhT (Pharmacy Technician) on 04/07/18 at [place] List Status: Complete  Medication Order Taking? Sig Documenting Provider Last Dose Status Informant  albuterol (PROVENTIL HFA;VENTOLIN HFA) 108 (90 Base) MCG/ACT inhaler [id number] Yes Inhale 2 puffs into the lungs every 6 (six) hours as needed for wheezing or shortness of breath. [provider] unk Active Self  APPLE CIDER VINEGAR PO [id number] Yes Take 15 mLs by mouth 2 (two) times daily.  [provider] 04/06/2018 Unknown time Active Self  atorvastatin (LIPITOR) 20 MG tablet [id number] Yes TAKE 1 TABLET BY MOUTH EVERYDAY AT BEDTIME  Patient taking differently:  Take 20 mg by mouth daily at 6 PM.    [name], DO 04/05/2018 Unknown time Active Self  B Complex Vitamins (B COMPLEX 100 PO) [id number] Yes Take 1 capsule by mouth every other day.  [provider] 04/05/2018 Unknown time Active  Self  blood glucose meter kit and supplies [id number] Yes Dispense based on patient and insurance preference. Use to check glucose fasting in the AM and then after largest meal of the day.. (FOR ICD-10 E10.9, E11.9). [name], DO  Active Self  blood glucose meter kit and supplies KIT [id number] Yes Dispense based on patient and insurance preference. Use up to four times daily as directed. (FOR ICD-9 250.00, 250.01). [name], DO  Active Self  Blood Glucose Monitoring Suppl (ONE TOUCH ULTRA MINI) w/Device KIT [id number] Yes Use to test blood sugar, test in the morning fasting and test after largest meal of the day. [name], DO  Active Self  butalbital-acetaminophen-caffeine (FIORICET, ESGIC) 50-325-40 MG tablet [id number] Yes Take 1 tablet by mouth every 6 (six) hours as needed for headache. Lavina Hamman, MD Past Month Unknown time Active Self  Cholecalciferol (VITAMIN D3) 5000 units TABS [id number] Yes 5,000 IU OTC vitamin D3 daily.  Patient taking differently:  Take 5,000 Units by mouth daily.    [name], DO 04/06/2018 Unknown time Active Self  cyclobenzaprine (FLEXERIL) 10 MG tablet [id number] Yes TAKE 1 TABLET BY MOUTH TWICE A DAY  Patient taking differently:  Take 10 mg by mouth 3 (three) times daily as needed for muscle spasms.    [name], DO 04/06/2018 Unknown time Active Self  glucose blood (ONE TOUCH ULTRA TEST) test strip [id number] Yes Use to test blood sugar, test in the morning fasting and test after largest meal of the day. [name], DO  Active Self  hydrochlorothiazide (MICROZIDE) 12.5  MG capsule [id number] No Take 1 capsule (12.5 mg total) by mouth daily.  Patient not taking:  Reported on 04/07/2018   Lavina Hamman, MD Not Taking Unknown time Active Self           Med Note Nyoka Cowden, FELICIA D   Mon Apr 07, 2018 10:43 PM) Pt not taking   meloxicam (MOBIC) 15 MG tablet [id number] Yes Take 1 tablet (15 mg total) by mouth daily.  Patient taking  differently:  Take 15 mg by mouth daily as needed for pain.    [name], DO 04/07/2018 Unknown time Active Self  metFORMIN (GLUCOPHAGE) 500 MG tablet [id number] Yes Take 2 tablets (1,000 mg total) by mouth 2 (two) times daily with a meal. Opalski, Deborah, DO 04/07/2018 Unknown time Active Self  naphazoline-pheniramine (NAPHCON-A) 0.025-0.3 % ophthalmic solution [id number] Yes Place 1 drop into both eyes 4 (four) times daily as needed for eye irritation. [provider] unk Active Self  Olmesartan-amLODIPine-HCTZ 20-5-12.5 MG TABS [id number] Yes Take 1 tablet by mouth daily. [provider] 04/06/2018 Unknown time Active Self  Jonetta Speak LANCETS 93Z [place] [id number] Yes Use for testing blood sugar, test once in the morning fasting and test after largest meal of the day. [name], DO unk Active Self  Potassium 75 MG TABS [id number] Yes Take 150 mg by mouth every other day.  [provider] 04/06/2018 Unknown time Active Self  traMADol (ULTRAM) 50 MG tablet [id number] Yes Take 100 mg by mouth 3 (three) times daily. For pain [provider] 04/07/2018 0830 Active Self           Med Note Nyoka Cowden, FELICIA D   Mon Apr 07, 2018 10:37 PM) Scheduled   vitamin C (ASCORBIC ACID) 500 MG tablet [id number] Yes Take 500 mg by mouth daily. [provider] Past Week Unknown time Active Self           Med Note Nyoka Cowden, FELICIA D   Mon Apr 07, 2018 10:37 PM) Pt ran out   Vitamin D, Ergocalciferol, (DRISDOL) 50000 units CAPS capsule [id number] Yes TAKE 1 CAPSULE WEEKLY  Patient taking differently:  Take 50,000 Units by mouth every Friday.    [name], DO 04/04/2018 Active Self  VITAMIN E PO [id number] Yes Take 1 tablet by mouth daily. [provider] 04/07/2018 Unknown time Active Self  zinc gluconate 50 MG tablet [id number] Yes Take 50 mg by mouth every other day.  [provider] 04/06/2018 Unknown time Active Self         Assessment:  Date  Discharged from Hospital: 04/10/2018 Date Medication Reconciliation Performed: 04/16/2018  Medications Discontinued at Discharge:   None  New Medications at Discharge:   Amoxicillin-clavulanate  Cyanocobalamin  Mupirocin cream  Medications with Dose Adjustments at Discharge: . None   Patient was recently discharged from hospital and all medications have been reviewed.  Drugs sorted by system:  Cardiovascular: atorvastatin, olmesartan-amlodipine-HCTZ  Pulmonary/Allergy: albuterol INH  Topical:mupirocin cream, naphcon-A eye drops  Pain: butalbital-acetaminophen-caffeine, cyclobenzaprine, tramadol, meloxicam  Infectious Diseases: amoxicillin-clavulanate  Vitamins/Minerals/Supplements: apple cider vinegar, B complex, calcium-mg-zinc, cholecalciferol, ergocalciferol, dandelion, potassium, turmeric, vitamin B12, vitamin C, vitamin E   Medication Review Findings:  . Patient has duplicate bottles of medicine with HCTZ: one bottle of HCTZ 12.63m filled separately from new combination pill olmesartan-amlodipine-HCTZ.  Patient counseled to only take combination pill unless cardiologist / PCP has recommended that he take both together.  Patient voiced understanding.   . Patient states he has no medication concerns  at this time or financial difficulty affording medications.    Plan: I provided patient with [facility] RN phone number who has been trying to reach him for [facility] services.   I will close pharmacy case at this time but am happy to assist in the future as needed.    Thank you for allowing [facility] pharmacy to be involved in this patient's care.     Ralene Bathe, PharmD, [place] [phone]

## 2018-04-16 NOTE — Telephone Encounter (Signed)
Patient called today concerned that some of his symptoms are coming back.  Patient states right lower leg is becoming more painful and the redness is slowly spreading.  Patient denies a fever at this time and states he is continuing to take Augmentin.  Patient aware that he has an appointment with Dr. Orvan Falconer tomorrow 04/17/2018 at 11:30.  Patient advised if symptoms persist and worsen then he needs to go to the emergency room.  Also informed him that Dr. Orvan Falconer would be made aware.  Patient verbalized understanding. Angeline Slim RN

## 2018-04-17 ENCOUNTER — Encounter: Payer: Self-pay | Admitting: Internal Medicine

## 2018-04-17 ENCOUNTER — Ambulatory Visit (INDEPENDENT_AMBULATORY_CARE_PROVIDER_SITE_OTHER): Payer: Medicare Other | Admitting: Internal Medicine

## 2018-04-17 ENCOUNTER — Other Ambulatory Visit: Payer: Self-pay

## 2018-04-17 DIAGNOSIS — R768 Other specified abnormal immunological findings in serum: Secondary | ICD-10-CM | POA: Diagnosis not present

## 2018-04-17 DIAGNOSIS — M869 Osteomyelitis, unspecified: Secondary | ICD-10-CM | POA: Diagnosis not present

## 2018-04-17 DIAGNOSIS — L03119 Cellulitis of unspecified part of limb: Secondary | ICD-10-CM | POA: Diagnosis not present

## 2018-04-17 NOTE — Progress Notes (Signed)
[place] for Infectious Disease  Patient Active Problem List   Diagnosis Date Noted  . Osteomyelitis of second toe of right foot ([place]) 04/10/2018    Priority: High  . Recurrent cellulitis of lower extremity 04/07/2018    Priority: High  . Hepatitis C antibody test positive 04/16/2018    Priority: Medium  . Pancytopenia ([place]) 03/15/2018    Priority: Medium  . Hyperlipidemia 04/08/2018  . Chronic neuropathy of both feet-  due to lumbar radiculopathy as well as diabetic neuropathy 03/25/2018  . DJD (degenerative joint disease) 01/09/2018  . Type 2 diabetes mellitus with complication, without long-term current use of insulin ([place]) 09/30/2017  . Essential hypertension 09/30/2017  . Mixed diabetic hyperlipidemia associated with type 2 diabetes mellitus ([place]) 09/30/2017  . Barrett's esophagus 12/07/2016  . Chronic venous stasis dermatitis of both lower extremities 05/08/2016  . Vitamin D deficiency 05/08/2016  . OSA on CPAP- seen Dr Melvyn Novas in past 04/11/2016  . Bilateral lower extremity pitting edema 04/11/2016  . Physical deconditioning 04/11/2016  . Restrictive lung disease secondary to obesity 04/11/2016  . History of tobacco use 04/11/2016  . h/o Depressive disorder 04/11/2016  . Lumbar radiculopathy 07/21/2012  . Chronic pain due to injury 07/21/2012  . Morbid obesity BMI >er 48 02/15/2012  . Postlaminectomy syndrome, lumbar region 01/04/2012  . h/o Narcotic abuse 01/04/2012  . Constipation 09/22/2011  . Chronic back pain 09/22/2011    Patient's Medications  New Prescriptions   No medications on file  Previous Medications   ALBUTEROL (PROVENTIL HFA;VENTOLIN HFA) 108 (90 BASE) MCG/ACT INHALER    Inhale 2 puffs into the lungs every 6 (six) hours as needed for wheezing or shortness of breath.   AMOXICILLIN-CLAVULANATE (AUGMENTIN) 875-125 MG TABLET    Take 1 tablet by mouth every 12 (twelve) hours for 28 days.   APPLE CIDER VINEGAR PO    Take 15 mLs by mouth 2  (two) times daily.    ATORVASTATIN (LIPITOR) 20 MG TABLET    TAKE 1 TABLET BY MOUTH EVERYDAY AT BEDTIME   B COMPLEX VITAMINS (B COMPLEX 100 PO)    Take 1 capsule by mouth every other day.    BLOOD GLUCOSE METER KIT AND SUPPLIES    Dispense based on patient and insurance preference. Use to check glucose fasting in the AM and then after largest meal of the day.. (FOR ICD-10 E10.9, E11.9).   BLOOD GLUCOSE METER KIT AND SUPPLIES KIT    Dispense based on patient and insurance preference. Use up to four times daily as directed. (FOR ICD-9 250.00, 250.01).   BLOOD GLUCOSE MONITORING SUPPL (ONE TOUCH ULTRA MINI) W/DEVICE KIT    Use to test blood sugar, test in the morning fasting and test after largest meal of the day.   BUTALBITAL-ACETAMINOPHEN-CAFFEINE (FIORICET, ESGIC) 50-325-40 MG TABLET    Take 1 tablet by mouth every 6 (six) hours as needed for headache.   CALCIUM-MAGNESIUM-ZINC PO    Take 1 tablet by mouth every other day.   CHOLECALCIFEROL (VITAMIN D3) 5000 UNITS TABS    5,000 IU OTC vitamin D3 daily.   CYCLOBENZAPRINE (FLEXERIL) 10 MG TABLET    TAKE 1 TABLET BY MOUTH TWICE A DAY   DANDELION PO    Take 1 tablet by mouth every other day.   GLUCOSE BLOOD (ONE TOUCH ULTRA TEST) TEST STRIP    Use to test blood sugar, test in the morning fasting and test after largest meal of the  day.   MELOXICAM (MOBIC) 15 MG TABLET    Take 1 tablet (15 mg total) by mouth daily.   METFORMIN (GLUCOPHAGE) 500 MG TABLET    Take 2 tablets (1,000 mg total) by mouth 2 (two) times daily with a meal.   MUPIROCIN CREAM (BACTROBAN) 2 %    Apply topically daily.   MUPIROCIN OINTMENT (BACTROBAN) 2 %    Apply 1 application topically daily.   NAPHAZOLINE-PHENIRAMINE (NAPHCON-A) 0.025-0.3 % OPHTHALMIC SOLUTION    Place 1 drop into both eyes 4 (four) times daily as needed for eye irritation.   OLMESARTAN-AMLODIPINE-HCTZ 20-5-12.5 MG TABS    Take 1 tablet by mouth daily.   ONETOUCH DELICA LANCETS 24E MISC    Use for testing blood  sugar, test once in the morning fasting and test after largest meal of the day.   POTASSIUM 99 MG TABS    Take 1 tablet by mouth every other day.    TRAMADOL (ULTRAM) 50 MG TABLET    Take 100 mg by mouth 4 (four) times daily. For pain   TURMERIC CURCUMIN PO    Take 1 tablet by mouth every other day.   VITAMIN B-12 1000 MCG TABLET    Take 1 tablet (1,000 mcg total) by mouth daily.   VITAMIN C (ASCORBIC ACID) 500 MG TABLET    Take 500 mg by mouth daily.   VITAMIN D, ERGOCALCIFEROL, (DRISDOL) 50000 UNITS CAPS CAPSULE    TAKE 1 CAPSULE WEEKLY   VITAMIN E PO    Take 1 tablet by mouth daily.  Modified Medications   No medications on file  Discontinued Medications   No medications on file    Subjective: Robert [name] is in for his routine follow-up visit.  He was hospitalized last week with recurrent right leg cellulitis.  It was noted that he had an ulcer over the tip of his right second toe and acute cellulitis of that toe.  MRI revealed early osteomyelitis of the distal tuft of his right second toe.  I recommended discharged on oral amoxicillin clavulanate.  He is tolerating it well.  He still has some redness over his anterior shin but overall is doing much better.  When hospitalized I noted that he had pancytopenia.  I ordered a hepatitis C antibody which was positive.  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  He had multiple blood transfusions at that time.  He denies any history of injecting drug use or tattoos.  Review of Systems: Review of Systems  Constitutional: Negative for chills, diaphoresis and fever.  Respiratory: Negative for cough, sputum production and shortness of breath.   Cardiovascular: Negative for chest pain.  Gastrointestinal: Negative for abdominal pain, diarrhea, nausea and vomiting.  Genitourinary: Negative for dysuria.  Musculoskeletal: Negative for joint pain.  Neurological: Negative for headaches.    Psychiatric/Behavioral: Negative for substance abuse.    Past Medical History:  Diagnosis Date  . Allergic rhinitis due to pollen   . Arthritis   . Asthma   . Back pain   . Barrett esophagus   . Depressive disorder   . Diabetes mellitus type 2 in obese ([place])   . Exposure to hepatitis B   . Exposure to hepatitis C   . Narcotic abuse ([facility])    pain medications  . OSA (obstructive sleep apnea)    on CPAP     Social History   Tobacco Use  . Smoking status: Former Smoker  Packs/day: 0.80    Years: 15.00    Pack years: 12.00    Types: Cigarettes    Last attempt to quit: 07/16/2004    Years since quitting: 13.7  . Smokeless tobacco: Never Used  Substance Use Topics  . Alcohol use: No  . Drug use: No    Comment: Prior abuse of narcotics    Family History  Problem Relation Age of Onset  . Allergies Mother   . Early death Father   . Thyroid disease Sister   . Colon cancer Neg Hx   . Stomach cancer Neg Hx     No Known Allergies  Objective: Vitals:   04/17/18 1114  BP: 119/70  Pulse: 72  Weight: (!) 346 lb (156.9 kg)  Height: 6' 1"  (1.854 m)   Body mass index is 45.65 kg/m.  Physical Exam  Constitutional: He is oriented to person, place, and time.  His weight is unchanged.  He is in good spirits.  HENT:  Mouth/Throat: No oropharyngeal exudate.  Eyes: Conjunctivae are normal.  Cardiovascular: Normal rate, regular rhythm and normal heart sounds.  Pulmonary/Chest: Effort normal and breath sounds normal.  Abdominal: Soft. He exhibits no distension and no mass. There is no tenderness.  He is morbidly obese.  I am unable to palpate his liver or spleen.  Neurological: He is alert and oriented to person, place, and time.  Skin:  He still has some erythema over his anterior shin and some discoloration from just above his ankle.  He has a healing scar on his lower shin.  The ulcer on his distal right second toe is unchanged.  The cellulitis of his right second toe  has improved.  Psychiatric: He has a normal mood and affect.    Lab Results Sed Rate (mm/hr)  Date Value  04/10/2018 60 (H)  04/08/2018 45 (H)  03/15/2018 15   CRP (mg/dL)  Date Value  04/10/2018 6.0 (H)  03/16/2018 14.8 (H)   CMP     Component Value Date/Time   NA 140 04/10/2018 0539   NA 141 12/04/2017 0838   K 3.8 04/10/2018 0539   CL 106 04/10/2018 0539   CO2 28 04/10/2018 0539   GLUCOSE 132 (H) 04/10/2018 0539   BUN 13 04/10/2018 0539   BUN 14 12/04/2017 0838   CREATININE 0.86 04/10/2018 0539   CREATININE 0.81 04/25/2016 0846   CALCIUM 9.1 04/10/2018 0539   PROT 6.3 (L) 04/10/2018 0539   PROT 6.6 12/04/2017 0838   ALBUMIN 3.3 (L) 04/10/2018 0539   ALBUMIN 4.3 12/04/2017 0838   AST 26 04/10/2018 0539   ALT 31 04/10/2018 0539   ALKPHOS 50 04/10/2018 0539   BILITOT 0.7 04/10/2018 0539   BILITOT 0.4 12/04/2017 0838   GFRNONAA >60 04/10/2018 0539   GFRNONAA >89 04/25/2016 0846   GFRAA >60 04/10/2018 0539   GFRAA >89 04/25/2016 0846   Lab Results  Component Value Date   WBC 3.4 (L) 04/10/2018   HGB 10.9 (L) 04/10/2018   HCT 31.4 (L) 04/10/2018   MCV 90.0 04/10/2018   PLT 145 (L) 04/10/2018    Hepatitis C antibody 04/10/2018: Positive  Problem List Items Addressed This Visit      High   Recurrent cellulitis of lower extremity    I believe that his acute cellulitis has resolved and that his persistent erythema is likely due to to venous stasis dermatitis.  He is going to get a new pair of knee-high compressive stockings.  Osteomyelitis of second toe of right foot ([place])    I plan to continue amoxicillin clavulanate for a small focus of osteomyelitis in his distal right second toe.  He will follow-up here in 2 weeks.      Relevant Medications   mupirocin ointment (BACTROBAN) 2 %     Medium   Hepatitis C antibody test positive    He has hepatitis C.  I suspect that it is chronic and active given that he had splenomegaly noted on the CT scan and  ultrasound done in late 2013 and because he has pancytopenia.  I will check a hepatitis C viral load and genotype and see him back in 2 weeks.  The blood transfusions he received in 8916 could certainly be the source of his infection.      Relevant Orders   Hepatitis C genotype   Hepatitis C RNA quantitative       Michel Bickers, MD [facility] for [place] [phone] pager   [phone] cell 04/17/2018, 11:54 AM

## 2018-04-17 NOTE — Assessment & Plan Note (Signed)
I believe that his acute cellulitis has resolved and that his persistent erythema is likely due to to venous stasis dermatitis.  He is going to get a new pair of knee-high compressive stockings.

## 2018-04-17 NOTE — Assessment & Plan Note (Signed)
I plan to continue amoxicillin clavulanate for a small focus of osteomyelitis in his distal right second toe.  He will follow-up here in 2 weeks.

## 2018-04-17 NOTE — Assessment & Plan Note (Signed)
He has hepatitis C.  I suspect that it is chronic and active given that he had splenomegaly noted on the CT scan and ultrasound done in late 2013 and because he has pancytopenia.  I will check a hepatitis C viral load and genotype and see him back in 2 weeks.  The blood transfusions he received in 1610 could certainly be the source of his infection.

## 2018-04-17 NOTE — Patient Outreach (Signed)
Robert [name]) Care Management   04/17/2018  Robert [name] 20-Nov-1955 [id number]  Robert [name] is an 62 y.o. male  Subjective: Patient reports that he saw the infectious disease doctor today and will continue to take Augmentin. Reports he still has a hole in the right second toe at the tip. Reports no drainage but continues to have warmth up the right leg.  Patient report that [place] and his right 2nd toe started bleeding. Then he report his toe got infected and he was hospitalized. Readmitted 1 week ago for recurrent infection and early stages of osteomyelitis. Patient reports DM under good control on oral medications. Reports following DM diet. Last A1c of 6.   Reports monitoring CBG 3 times per week per MD recommendation.   Objective:  Awake and alert. Ambulates slowly in the home. Normally uses a walking stick. Wearing compression hose and shoes.  4-5 dogs and cats in the home.   Today's Vitals   04/17/18 1402 04/17/18 1405  BP: 112/60   Pulse: 80   Resp: 16   SpO2: 98%   Weight: (!) 340 lb (154.2 kg)   Height: 1.854 m ('6\' 1"'$ )   PainSc:  4    Review of Systems  Constitutional: Negative.   HENT: Negative.   Eyes:       Wearing glasses. Due to have an eye exam.  Cardiovascular: Positive for leg swelling.  Gastrointestinal: Negative.   Genitourinary: Negative.   Musculoskeletal: Positive for back pain and joint pain.  Skin:       Right lower leg discoloration .  Neurological: Negative.   Endo/Heme/Allergies: Negative.   Psychiatric/Behavioral: Negative.     Physical Exam  Constitutional: He is oriented to person, place, and time. He appears well-developed and well-nourished.  Cardiovascular: Normal rate and regular rhythm.  Respiratory: Effort normal and breath sounds normal.  GI: Soft. Bowel sounds are normal.  Musculoskeletal: Normal range of motion. He exhibits edema.  1 plus edema to the lower legs  Neurological: He is  alert and oriented to person, place, and time.  Skin: Skin is warm and dry.  Right 2nd toe swollen with hole at the end of the toe.    Psychiatric: He has a normal mood and affect. His behavior is normal. Judgment and thought content normal.    Encounter Medications:   Outpatient Encounter Medications as of 04/17/2018  Medication Sig Note  . albuterol (PROVENTIL HFA;VENTOLIN HFA) 108 (90 Base) MCG/ACT inhaler Inhale 2 puffs into the lungs every 6 (six) hours as needed for wheezing or shortness of breath.   Marland Kitchen amoxicillin-clavulanate (AUGMENTIN) 875-125 MG tablet Take 1 tablet by mouth every 12 (twelve) hours for 28 days.   . APPLE CIDER VINEGAR PO Take 15 mLs by mouth 2 (two) times daily.    Marland Kitchen atorvastatin (LIPITOR) 20 MG tablet TAKE 1 TABLET BY MOUTH EVERYDAY AT BEDTIME (Patient taking differently: Take 20 mg by mouth daily at 6 PM. )   . B Complex Vitamins (B COMPLEX 100 PO) Take 1 capsule by mouth every other day.  04/17/2018: Takes every other day  . blood glucose meter kit and supplies KIT Dispense based on patient and insurance preference. Use up to four times daily as directed. (FOR ICD-9 250.00, 250.01).   . blood glucose meter kit and supplies Dispense based on patient and insurance preference. Use to check glucose fasting in the AM and then after largest meal of the day.. (  FOR ICD-10 E10.9, E11.9).   Marland Kitchen Blood Glucose Monitoring Suppl (ONE TOUCH ULTRA MINI) w/Device KIT Use to test blood sugar, test in the morning fasting and test after largest meal of the day.   . butalbital-acetaminophen-caffeine (FIORICET, ESGIC) 50-325-40 MG tablet Take 1 tablet by mouth every 6 (six) hours as needed for headache.   Marland Kitchen CALCIUM-MAGNESIUM-ZINC PO Take 1 tablet by mouth every other day. 04/17/2018: Every other day  . Cholecalciferol (VITAMIN D3) 5000 units TABS 5,000 IU OTC vitamin D3 daily. (Patient taking differently: Take 5,000 Units by mouth daily. )   . cyclobenzaprine (FLEXERIL) 10 MG tablet TAKE 1  TABLET BY MOUTH TWICE A DAY (Patient taking differently: Take 10 mg by mouth 3 (three) times daily as needed for muscle spasms. )   . DANDELION PO Take 1 tablet by mouth every other day.   Marland Kitchen glucose blood (ONE TOUCH ULTRA TEST) test strip Use to test blood sugar, test in the morning fasting and test after largest meal of the day.   . meloxicam (MOBIC) 15 MG tablet Take 1 tablet (15 mg total) by mouth daily. (Patient taking differently: Take 15 mg by mouth daily as needed for pain. )   . metFORMIN (GLUCOPHAGE) 500 MG tablet Take 2 tablets (1,000 mg total) by mouth 2 (two) times daily with a meal.   . mupirocin cream (BACTROBAN) 2 % Apply topically daily.   . mupirocin ointment (BACTROBAN) 2 % Apply 1 application topically daily.   . naphazoline-pheniramine (NAPHCON-A) 0.025-0.3 % ophthalmic solution Place 1 drop into both eyes 4 (four) times daily as needed for eye irritation.   . Olmesartan-amLODIPine-HCTZ 20-5-12.5 MG TABS Take 1 tablet by mouth daily.   Glory [place] DELICA LANCETS 55V MISC Use for testing blood sugar, test once in the morning fasting and test after largest meal of the day.   . Potassium 99 MG TABS Take 1 tablet by mouth every other day.    . traMADol (ULTRAM) 50 MG tablet Take 100 mg by mouth 4 (four) times daily. For pain   . TURMERIC CURCUMIN PO Take 1 tablet by mouth every other day.   . vitamin B-12 1000 MCG tablet Take 1 tablet (1,000 mcg total) by mouth daily.   . vitamin C (ASCORBIC ACID) 500 MG tablet Take 500 mg by mouth daily.   . Vitamin D, Ergocalciferol, (DRISDOL) 50000 units CAPS capsule TAKE 1 CAPSULE WEEKLY (Patient taking differently: Take 50,000 Units by mouth every Friday. )   . VITAMIN E PO Take 1 tablet by mouth daily.    No facility-administered encounter medications on file as of 04/17/2018.     Functional Status:   In your present state of health, do you have any difficulty performing the following activities: 04/17/2018 04/08/2018  Hearing? N N  Vision?  N N  Difficulty concentrating or making decisions? N N  Walking or climbing stairs? Y Y  Dressing or bathing? N Y  Doing errands, shopping? N N  Preparing Food and eating ? N -  Using the Toilet? N -  In the past six months, have you accidently leaked urine? N -  Do you have problems with loss of bowel control? N -  Managing your Medications? N -  Managing your Finances? N -  Housekeeping or managing your Housekeeping? N -  Some recent data might be hidden    Fall/Depression Screening:    Fall Risk  04/17/2018 01/09/2018 11/06/2017  Falls in the past year? Yes No Yes  Number falls in past yr: 2 or more - 1  Injury with Fall? No - Yes  Risk for fall due to : - - -  Risk for fall due to: Comment - - -   PHQ 2/9 Scores 04/17/2018 03/24/2018 01/09/2018 10/31/2017 09/30/2017 07/02/2017 05/09/2017  PHQ - 2 Score 0 1 0 1 2 0 0  PHQ- 9 Score - 2 2 2 7 3 4     Assessment:   (1) reviewed [facility] transition of care program. Patient has welcome packet on coffee table. Provided my contact card. Reviewed consent and patient declines any needed changes. (2) no advanced directives. (3) Dm under good control. Takes medications as prescribed.  (4) chronic back pain. (5) 2 recent admission for cellulitis of the right foot and 2nd toe.   (6) pending podiatry appointment for 06/11/2018 (7) takes all medications as prescribed.  (8) pending primary MD follow up  Plan:  (1) consent in medical record. Next telephone outreach in 1 week. (2) provided advanced directive packet and reviewed how to complete. (3) Reviewed risk for infections with DM. Encouraged patient to continue to take his medications as prescribed, follow his diet and follow up with MD. (4) encouraged patient to continue to be active. (5) Reviewed with patient when to return to the MD office VS hospital. Encouraged patient to carefully examine feet daily. Reviewed signs of infection. (6) Placed call to [place] and requested a sooner  appointment.  Secured appointment for 04/21/2018 in [place]. Provided patient with address and patient willing to drive to [facility] since it is not far for him.  Encouraged patient to take all his medications with him to this appointment. (7) Encouraged patient to continue to take all medications as prescribed by MD. (8) reviewed pending follow up. Patient is able to drive.   This note and barrier letter sent to MD. Next outreach 1 week.  [facility] CM Care Plan Problem One     Most Recent Value  Care Plan Problem One  Recent admission for cellulitis of the foot.  Role Documenting the Problem One  Care Management [place] for Problem One  Active  [facility] Long Term Goal   Patient will reports healing of right 2nd toe in 45 days.   [facility] Long Term Goal Start Date  04/17/18  Interventions for Problem One Long Term Goal  Home visit completed. Arranged podiatry appointment. Reviewed signs of infection and when to return to MD.   [facility] CM Short Term Goal #1   Patient will call MD for any worsening or symptoms in thenext 30 days.   [facility] CM Short Term Goal #1 Start Date  04/17/18  Interventions for Short Term Goal #1  Reviewed with patient when to call MD. Encouraged patient to report any changes to MD immediately.   [facility] CM Short Term Goal #2   Patient will monitor and observe his feet daily for the next 30 days.   [facility] CM Short Term Goal #2 Start Date  04/17/18  Interventions for Short Term Goal #2  Reviewed importance of looking at feet every day. Encouraged patient to use a mirror or a family member to look at his feet if he is unable to.  Encouraged patient to wear protective socks and shoes at all time to avoid injury to the tip of the second toe.      Tomasa Rand, RN, BSN, CEN [facility] [employer] [phone]

## 2018-04-21 ENCOUNTER — Encounter: Payer: Self-pay | Admitting: Podiatry

## 2018-04-21 ENCOUNTER — Ambulatory Visit (INDEPENDENT_AMBULATORY_CARE_PROVIDER_SITE_OTHER): Payer: Medicare Other | Admitting: Podiatry

## 2018-04-21 VITALS — BP 109/59 | HR 73 | Temp 97.0°F | Resp 16

## 2018-04-21 DIAGNOSIS — M86671 Other chronic osteomyelitis, right ankle and foot: Secondary | ICD-10-CM

## 2018-04-21 DIAGNOSIS — E11621 Type 2 diabetes mellitus with foot ulcer: Secondary | ICD-10-CM | POA: Diagnosis not present

## 2018-04-21 DIAGNOSIS — B351 Tinea unguium: Secondary | ICD-10-CM

## 2018-04-21 DIAGNOSIS — R768 Other specified abnormal immunological findings in serum: Secondary | ICD-10-CM | POA: Diagnosis not present

## 2018-04-21 DIAGNOSIS — M2041 Other hammer toe(s) (acquired), right foot: Secondary | ICD-10-CM | POA: Diagnosis not present

## 2018-04-21 DIAGNOSIS — L97511 Non-pressure chronic ulcer of other part of right foot limited to breakdown of skin: Secondary | ICD-10-CM | POA: Diagnosis not present

## 2018-04-21 DIAGNOSIS — E1142 Type 2 diabetes mellitus with diabetic polyneuropathy: Secondary | ICD-10-CM | POA: Diagnosis not present

## 2018-04-21 DIAGNOSIS — Z6841 Body Mass Index (BMI) 40.0 and over, adult: Secondary | ICD-10-CM | POA: Diagnosis not present

## 2018-04-21 LAB — HEPATITIS C GENOTYPE: HCV Genotype: NOT DETECTED

## 2018-04-21 LAB — HEPATITIS C RNA QUANTITATIVE
HCV Quantitative Log: <1.18 Log IU/mL
HCV RNA, PCR, QN: NOT DETECTED [IU]/mL

## 2018-04-21 NOTE — Progress Notes (Signed)
Subjective:  Patient ID: Robert [name], male    DOB: [birth year],  MRN: [id number]  Chief Complaint  Patient presents with  . nail care    BL nail trimming -FBS: 110 x 1 wk A1C: 6 PCP: Opal[place]chi x 3 wks  . Skin Problem    Lesion: R 2nd toe x 1 mo; 6/10 pain -w/ yellow-clear drainage. swelling and redness -pt denies N/V/F/Ch Pt. stated," I've had fevers since late Aug due to my cellulitis."    62 y.o. male presents  for diabetic foot care. Last AMBS was 110. Last saw PCP 3 weeks ago. Reports numbness and tingling in their feet. Denies cramping in legs and thighs.  Reports wound to the right 2nd toe for 1 month. Admitted for this workup where MRI was performed concerning for OM. Today he denies N/V/Ch but endorses intermittent fevers. Currently on augmentin for at least 4 week course per patient. Toe is looking much better and not as red per patient.  Review of Systems: Negative except as noted in the HPI. Denies N/V/F/Ch.  Past Medical History:  Diagnosis Date  . Allergic rhinitis due to pollen   . Arthritis   . Asthma   . Back pain   . Barrett esophagus   . Depressive disorder   . Diabetes mellitus type 2 in obese ([name])   . Exposure to hepatitis B   . Exposure to hepatitis C   . Narcotic abuse (H[facility])    pain medications  . OSA (obstructive sleep apnea)    on CPAP     Current Outpatient Medications:  .  albuterol (PROVENTIL HFA;VENTOLIN HFA) 108 (90 Base) MCG/ACT inhaler, Inhale 2 puffs into the lungs every 6 (six) hours as needed for wheezing or shortness of breath., Disp: , Rfl:  .  amoxicillin-clavulanate (AUGMENTIN) 875-125 MG tablet, Take 1 tablet by mouth every 12 (twelve) hours for 28 days., Disp: 56 tablet, Rfl: 0 .  APPLE CIDER VINEGAR PO, Take 15 mLs by mouth 2 (two) times daily. , Disp: , Rfl:  .  atorvastatin (LIPITOR) 20 MG tablet, TAKE 1 TABLET BY MOUTH EVERYDAY AT BEDTIME (Patient taking differently: Take 20 mg by mouth daily at 6 PM. ), Disp: 90 tablet, Rfl: 0 .   B Complex Vitamins (B COMPLEX 100 PO), Take 1 capsule by mouth every other day. , Disp: , Rfl:  .  blood glucose meter kit and supplies KIT, Dispense based on patient and insurance preference. Use up to four times daily as directed. (FOR ICD-9 250.00, 250.01)., Disp: 1 each, Rfl: 0 .  blood glucose meter kit and supplies, Dispense based on patient and insurance preference. Use to check glucose fasting in the AM and then after largest meal of the day.. (FOR ICD-10 E10.9, E11.9)., Disp: 1 each, Rfl: 0 .  Blood Glucose Monitoring Suppl (ONE TOUCH ULTRA MINI) w/Device KIT, Use to test blood sugar, test in the morning fasting and test after largest meal of the day., Disp: 1 each, Rfl: 0 .  butalbital-acetaminophen-caffeine (FIORICET, ESGIC) 50-325-40 MG tablet, Take 1 tablet by mouth every 6 (six) hours as needed for headache., Disp: 14 tablet, Rfl: 0 .  CALCIUM-MAGNESIUM-ZINC PO, Take 1 tablet by mouth every other day., Disp: , Rfl:  .  Cholecalciferol (VITAMIN D3) 5000 units TABS, 5,000 IU OTC vitamin D3 daily. (Patient taking differently: Take 5,000 Units by mouth daily. ), Disp: 90 tablet, Rfl: 3 .  cyclobenzaprine (FLEXERIL) 10 MG tablet, TAKE 1 TABLET BY MOUTH TWICE A DAY (  Patient taking differently: Take 10 mg by mouth 3 (three) times daily as needed for muscle spasms. ), Disp: 60 tablet, Rfl: 1 .  DANDELION PO, Take 1 tablet by mouth every other day., Disp: , Rfl:  .  glucose blood (ONE TOUCH ULTRA TEST) test strip, Use to test blood sugar, test in the morning fasting and test after largest meal of the day., Disp: 100 each, Rfl: 11 .  meloxicam (MOBIC) 15 MG tablet, Take 1 tablet (15 mg total) by mouth daily. (Patient taking differently: Take 15 mg by mouth daily as needed for pain. ), Disp: 90 tablet, Rfl: 1 .  metFORMIN (GLUCOPHAGE) 500 MG tablet, Take 2 tablets (1,000 mg total) by mouth 2 (two) times daily with a meal., Disp: 360 tablet, Rfl: 1 .  mupirocin cream (BACTROBAN) 2 %, Apply topically  daily., Disp: 15 g, Rfl: 0 .  mupirocin ointment (BACTROBAN) 2 %, Apply 1 application topically daily., Disp: , Rfl: 0 .  naphazoline-pheniramine (NAPHCON-A) 0.025-0.3 % ophthalmic solution, Place 1 drop into both eyes 4 (four) times daily as needed for eye irritation., Disp: , Rfl:  .  Olmesartan-amLODIPine-HCTZ 20-5-12.5 MG TABS, Take 1 tablet by mouth daily., Disp: , Rfl:  .  ONETOUCH DELICA LANCETS 41U MISC, Use for testing blood sugar, test once in the morning fasting and test after largest meal of the day., Disp: 100 each, Rfl: 11 .  Potassium 99 MG TABS, Take 1 tablet by mouth every other day. , Disp: , Rfl:  .  traMADol (ULTRAM) 50 MG tablet, Take 100 mg by mouth 4 (four) times daily. For pain, Disp: , Rfl:  .  TURMERIC CURCUMIN PO, Take 1 tablet by mouth every other day., Disp: , Rfl:  .  UNABLE TO FIND, Take 1,600 mg by mouth 3 (three) times daily. [place] grape, Disp: , Rfl:  .  vitamin B-12 1000 MCG tablet, Take 1 tablet (1,000 mcg total) by mouth daily., Disp: 30 tablet, Rfl: 0 .  vitamin C (ASCORBIC ACID) 500 MG tablet, Take 500 mg by mouth daily., Disp: , Rfl:  .  Vitamin D, Ergocalciferol, (DRISDOL) 50000 units CAPS capsule, TAKE 1 CAPSULE WEEKLY (Patient taking differently: Take 50,000 Units by mouth every Friday. ), Disp: 12 capsule, Rfl: 4 .  VITAMIN E PO, Take 1 tablet by mouth daily., Disp: , Rfl:   Social History   Tobacco Use  Smoking Status Former Smoker  . Packs/day: 0.80  . Years: 15.00  . Pack years: 12.00  . Types: Cigarettes  . Last attempt to quit: 07/16/2004  . Years since quitting: 13.7  Smokeless Tobacco Never Used    No Known Allergies Objective:   Vitals:   04/21/18 1106  BP: (!) 109/59  Pulse: 73  Resp: 16  Temp: (!) 97 F (36.1 C)   There is no height or weight on file to calculate BMI. Constitutional Well developed. Well nourished.  Vascular Dorsalis pedis pulses present 1+ bilaterally  Posterior tibial pulses present 1+ bilaterally  Pedal  hair growth absent. Capillary refill normal to all digits.  No cyanosis or clubbing noted.  Neurologic Normal speech. Oriented to person, place, and time. Epicritic sensation to light touch grossly present bilaterally. Protective sensation with 5.07 monofilament  absent bilaterally. Vibratory sensation absent bilaterally.  Dermatologic Nails elongated, thickened, dystrophic. R 2nd toe distal tip ulcer measuring 0.5 in diameter without probe to bone. Wound bed granular. Small serous drainage. No warmth. No erythema. No signs of active infection.  Orthopedic: Normal  joint ROM without pain or crepitus bilaterally. Digital contractures bilaterally. Flexible. No bony tenderness.   Assessment:   1. Diabetic ulcer of toe of right foot associated with type 2 diabetes mellitus, limited to breakdown of skin ([place])   2. DM type 2 with diabetic peripheral neuropathy ([place])   3. Onychomycosis   4. Hammer toe of right foot   5. 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 BMI 40.0-44.9, adult ([place])   7. Hepatitis C antibody test positive    Plan:  Patient was evaluated and treated and all questions answered.  Diabetes with DPN, Onychomycosis -Educated on diabetic footcare. Diabetic risk level 1 -Nails x10 debrided sharply and manually with large nail nipper and rotary burr.   Procedure: Nail Debridement Rationale: Patient meets criteria for routine foot care due to DPN Type of Debridement: manual, sharp debridement. Instrumentation: Nail nipper, rotary burr. Number of Nails: 10   R 2nd Toe Ulceration -XR and MRI personally reviewed. Read concerning for OM but cortices appear intact. -Ulcer dressed with medihoney and DSD. -Discussed with patient that he would benefit from flexor tenotomy of the right 2nd digit to alleviate contracture and attempt to heal the ulceration. Discussed risks and benefits of procedure. Will plan for procedure next week. Despite risk factors risk of  continued ulceration in my opinion is worse than the risks associated with the percutaneous procedure. -Will benefit from close monitoring while on Abx -Continue Abx per ID.  Return in about 1 week (around 04/28/2018) for Flexor Tenotomy procedure R 2nd toe.

## 2018-04-23 ENCOUNTER — Other Ambulatory Visit: Payer: Self-pay

## 2018-04-23 NOTE — Patient Outreach (Signed)
Transition of care:  Placed call to patient who answered the phone. Patient reports that he is doing well. Reports saw foot doctor and is having a procedure next week to assist with healing of ulcer. Reports that he continues to take his medications as prescribed. Denies any new problems today.  PLAN: will continue transition of care calls weekly.  Rowe Pavy, RN, BSN, CEN [facility] [employer] [phone]

## 2018-04-24 ENCOUNTER — Ambulatory Visit: Payer: Medicare Other

## 2018-04-25 DIAGNOSIS — G894 Chronic pain syndrome: Secondary | ICD-10-CM | POA: Diagnosis not present

## 2018-04-25 DIAGNOSIS — M25569 Pain in unspecified knee: Secondary | ICD-10-CM | POA: Diagnosis not present

## 2018-04-25 DIAGNOSIS — M47816 Spondylosis without myelopathy or radiculopathy, lumbar region: Secondary | ICD-10-CM | POA: Diagnosis not present

## 2018-04-27 ENCOUNTER — Other Ambulatory Visit: Payer: Self-pay | Admitting: Family Medicine

## 2018-04-27 DIAGNOSIS — M159 Polyosteoarthritis, unspecified: Secondary | ICD-10-CM

## 2018-04-28 ENCOUNTER — Encounter: Payer: Self-pay | Admitting: Family Medicine

## 2018-04-28 ENCOUNTER — Ambulatory Visit (INDEPENDENT_AMBULATORY_CARE_PROVIDER_SITE_OTHER): Payer: Medicare Other | Admitting: Family Medicine

## 2018-04-28 VITALS — BP 102/66 | HR 64 | Ht 73.0 in | Wt 340.0 lb

## 2018-04-28 DIAGNOSIS — E1169 Type 2 diabetes mellitus with other specified complication: Secondary | ICD-10-CM

## 2018-04-28 DIAGNOSIS — E538 Deficiency of other specified B group vitamins: Secondary | ICD-10-CM

## 2018-04-28 DIAGNOSIS — L03119 Cellulitis of unspecified part of limb: Secondary | ICD-10-CM | POA: Diagnosis not present

## 2018-04-28 DIAGNOSIS — D61818 Other pancytopenia: Secondary | ICD-10-CM | POA: Diagnosis not present

## 2018-04-28 DIAGNOSIS — E782 Mixed hyperlipidemia: Secondary | ICD-10-CM | POA: Diagnosis not present

## 2018-04-28 DIAGNOSIS — R768 Other specified abnormal immunological findings in serum: Secondary | ICD-10-CM | POA: Diagnosis not present

## 2018-04-28 DIAGNOSIS — E118 Type 2 diabetes mellitus with unspecified complications: Secondary | ICD-10-CM

## 2018-04-28 DIAGNOSIS — E559 Vitamin D deficiency, unspecified: Secondary | ICD-10-CM | POA: Diagnosis not present

## 2018-04-28 NOTE — Progress Notes (Signed)
Impression and Recommendations:    1. Cellulitis of lower extremity, unspecified laterality   2. Morbid obesity   3. Pancytopenia ([place])   4. Hepatitis C antibody test positive   5. B12 deficiency   6. Type 2 diabetes mellitus with complication, without long-term current use of insulin (HCC)   7. Mixed diabetic hyperlipidemia associated with type 2 diabetes mellitus ([place])   8. Vitamin D deficiency     Hospital Follow up -Encouraged pt to ensure he follows up with podiatry and infection disease as directed -Encouraged pt to ensure he is taking his antibiotic as directed -Discussed hospital request for ABI of ankle and foot and instructed pt to have podiatry obtain this testing during his appointment tomorrow -Discussed how lack of blood flow or friction can cause an ulcer -Discussed the importance of staying active and continuing to exercise to help increase blood flow to his feet and promote healing -Discussed how stress, fighting infection and antibiotics can impact blood sugar and encouraged pt to begin checking his blood sugar again  Morbid Obesity -Pt has set own goal for weighing 320lbs by the end of the year -Pt to make a follow up appointment in 6 weeks with weight loss of 2 lbs per week -Discussed AHA guidelines of 1hr per day of cardio exercise -Encouraged pt to split exercise in order to meet guidelines -Encouraged pt to begin tracking his food on free apps such as LoseIt app and My Fitness Pal -Discussed the importance of exercising regularly to help continue losing weight -Expressed the importance of weight loss and exercise in helping to decrease the impact of diabetes and improving his overall health -Explained that while diet is crucial, it is also crucial to lose lbs due to increased BMI -Discussed possibility of weight loss clinics and nutritionists in the future to help with weight loss  Health Management -Pt receive blood panel in OV -Encouraged pt to  create a bp, glucose, and food log and bring them into next chronic appointment -Discussed how hepatitis C can compromise the immune system and its possible impact  Expresses verbal understanding and consents to current therapy and treatment regimen.  No barriers to understanding were identified.  Red flag symptoms and signs discussed in detail.  Patient expressed understanding regarding what to do in case of emergency\urgent symptoms  Please see AVS handed out to patient at the end of our visit for further patient instructions/ counseling done pertaining to today's office visit.   Return for f/up 6wks OV with me: 71mn - FBW 5 days prior.     Note:  This note was prepared with assistance of Dragon voice recognition software. Occasional wrong-word or sound-a-like substitutions may have occurred due to the inherent limitations of voice recognition software.  This document serves as a record of services personally performed by DMellody Dance MD. It was created on her behalf by LGeorga Bora a trained medical scribe. The creation of this record is based on the scribe's personal observations and the provider's statements to them.   I have reviewed the above medical documentation for accuracy and completeness and I concur.  DMellody Dance10/14/19 12:25 PM      --------------------------------------------------------------------------------------------------------------------------------------------------------------------------------------------------------------------------------------------    Subjective:     HPI: Robert Robert [name] a 62y.o. [name].   Hospital Follow - up -Pt denies fever, chills, difficulty eating or drinking, or difficulty passing  stool  -Pt states he was diagnosed with Hepatitis C and his infections disease believes this also negatively impacted his foot healing   -Pt  states the doctors caught osteomyelitis early so he has to take augmentin PO for 4-6 weeks  -Pt has also been going to podiatry for treatment of the initial ulcer on his toe -Pt is returning to podiatry to have the tendon in his toe cut in order to alleviate ulcer formation  -Pt has been using the mupirocin ointment with no issues  -States he has been walking up and down the street to get more activity and help with LE edema -Pt has also been wearing compression stockings to help alleviate LE edema   Wt Readings from Last 3 Encounters:  04/28/18 (!) 340 lb (154.2 kg)  04/17/18 (!) 340 lb (154.2 kg)  04/17/18 (!) 346 lb (156.9 kg)   BP Readings from Last 3 Encounters:  04/28/18 102/66  04/21/18 (!) 109/59  04/17/18 112/60   Pulse Readings from Last 3 Encounters:  04/28/18 64  04/21/18 73  04/17/18 80   BMI Readings from Last 3 Encounters:  04/28/18 44.86 kg/m  04/17/18 44.86 kg/m  04/17/18 45.65 kg/m     Patient Care Team    Relationship Specialty Notifications Start End  [name], DO PCP - General Family Medicine  02/20/16   Artist Pais, MD  Orthopedic Surgery  04/11/16   Dian Situ, MD  Pain Medicine  04/11/16   Tanda Rockers, MD Consulting Physician Pulmonary Disease  04/11/16    Comment: Obstructive sleep apnea, restrictive lung disease due to severe morbid obesity  Awilda Metro, PA-C  Pain Medicine  07/23/17   Thana Ates, [facility]  Admissions 04/11/18      Patient Active Problem List   Diagnosis Date Noted  . Type 2 diabetes mellitus with complication, without long-term current use of insulin ([place]) 09/30/2017    Priority: High  . Essential hypertension 09/30/2017    Priority: High  . Mixed diabetic hyperlipidemia associated with type 2 diabetes mellitus ([place]) 09/30/2017    Priority: High  . Morbid obesity BMI >er 48 02/15/2012    Priority: High  . Chronic neuropathy of both feet-  due to lumbar  radiculopathy as well as diabetic neuropathy 03/25/2018    Priority: Medium  . Chronic venous stasis dermatitis of both lower extremities 05/08/2016    Priority: Medium  . OSA on CPAP- seen Dr Melvyn Novas in past 04/11/2016    Priority: Medium  . Restrictive lung disease secondary to obesity 04/11/2016    Priority: Medium  . h/o Depressive disorder 04/11/2016    Priority: Medium  . Vitamin D deficiency 05/08/2016    Priority: Low  . Physical deconditioning 04/11/2016    Priority: Low  . History of tobacco use 04/11/2016    Priority: Low  . Postlaminectomy syndrome, lumbar region 01/04/2012    Priority: Low  . h/o Narcotic abuse 01/04/2012    Priority: Low  . Constipation 09/22/2011    Priority: Low  . Chronic back pain 09/22/2011    Priority: Low  . Hepatitis C antibody test positive 04/16/2018  . Osteomyelitis of second toe of right foot ([place]) 04/10/2018  . Hyperlipidemia 04/08/2018  . Recurrent cellulitis of lower extremity 04/07/2018  . Pancytopenia ([place]) 03/15/2018  . DJD (degenerative joint disease) 01/09/2018  . Barrett's esophagus 12/07/2016  . Bilateral lower extremity pitting edema 04/11/2016  . Lumbar radiculopathy 07/21/2012  . Chronic  pain due to injury 07/21/2012    Past Medical history, Surgical history, Family history, Social history, Allergies and Medications have been entered into the medical record, reviewed and changed as needed.    Current Meds  Medication Sig  . albuterol (PROVENTIL HFA;VENTOLIN HFA) 108 (90 Base) MCG/ACT inhaler Inhale 2 puffs into the lungs every 6 (six) hours as needed for wheezing or shortness of breath.  Marland Kitchen amoxicillin-clavulanate (AUGMENTIN) 875-125 MG tablet Take 1 tablet by mouth every 12 (twelve) hours for 28 days.  . APPLE CIDER VINEGAR PO Take 15 mLs by mouth 2 (two) times daily.   Marland Kitchen atorvastatin (LIPITOR) 20 MG tablet TAKE 1 TABLET BY MOUTH EVERYDAY AT BEDTIME (Patient taking differently: Take 20 mg by mouth daily at 6 PM. )  . B  Complex Vitamins (B COMPLEX 100 PO) Take 1 capsule by mouth every other day.   . blood glucose meter kit and supplies KIT Dispense based on patient and insurance preference. Use up to four times daily as directed. (FOR ICD-9 250.00, 250.01).  . blood glucose meter kit and supplies Dispense based on patient and insurance preference. Use to check glucose fasting in the AM and then after largest meal of the day.. (FOR ICD-10 E10.9, E11.9).  Marland Kitchen Blood Glucose Monitoring Suppl (ONE TOUCH ULTRA MINI) w/Device KIT Use to test blood sugar, test in the morning fasting and test after largest meal of the day.  . butalbital-acetaminophen-caffeine (FIORICET, ESGIC) 50-325-40 MG tablet Take 1 tablet by mouth every 6 (six) hours as needed for headache.  Marland Kitchen CALCIUM-MAGNESIUM-ZINC PO Take 1 tablet by mouth every other day.  . Cholecalciferol (VITAMIN D3) 5000 units TABS 5,000 IU OTC vitamin D3 daily. (Patient taking differently: Take 5,000 Units by mouth daily. )  . cyclobenzaprine (FLEXERIL) 10 MG tablet TAKE 1 TABLET BY MOUTH TWICE A DAY (Patient taking differently: Take 10 mg by mouth 3 (three) times daily as needed for muscle spasms. )  . DANDELION PO Take 1 tablet by mouth every other day.  Marland Kitchen glucose blood (ONE TOUCH ULTRA TEST) test strip Use to test blood sugar, test in the morning fasting and test after largest meal of the day.  . meloxicam (MOBIC) 15 MG tablet Take 1 tablet (15 mg total) by mouth daily. (Patient taking differently: Take 15 mg by mouth daily as needed for pain. )  . metFORMIN (GLUCOPHAGE) 500 MG tablet Take 2 tablets (1,000 mg total) by mouth 2 (two) times daily with a meal.  . mupirocin cream (BACTROBAN) 2 % Apply topically daily.  . mupirocin ointment (BACTROBAN) 2 % Apply 1 application topically daily.  . naphazoline-pheniramine (NAPHCON-A) 0.025-0.3 % ophthalmic solution Place 1 drop into both eyes 4 (four) times daily as needed for eye irritation.  . Olmesartan-amLODIPine-HCTZ 20-5-12.5 MG  TABS Take 1 tablet by mouth daily.  Glory Rosebush DELICA LANCETS 09W MISC Use for testing blood sugar, test once in the morning fasting and test after largest meal of the day.  . Potassium 99 MG TABS Take 1 tablet by mouth every other day.   . traMADol (ULTRAM) 50 MG tablet Take 100 mg by mouth 4 (four) times daily. For pain  . TURMERIC CURCUMIN PO Take 1 tablet by mouth every other day.  Marland Kitchen UNABLE TO FIND Take 1,600 mg by mouth 3 (three) times daily. Lithium grape  . vitamin B-12 1000 MCG tablet Take 1 tablet (1,000 mcg total) by mouth daily.  . vitamin C (ASCORBIC ACID) 500 MG tablet Take 500 mg  by mouth daily.  . Vitamin D, Ergocalciferol, (DRISDOL) 50000 units CAPS capsule TAKE 1 CAPSULE WEEKLY (Patient taking differently: Take 50,000 Units by mouth every Friday. )  . VITAMIN E PO Take 1 tablet by mouth daily.    Allergies:  No Known Allergies   Review of Systems:  A fourteen system review of systems was performed and found to be positive as per HPI.   Objective:   Blood pressure 102/66, pulse 64, height _0  (1.854 m), weight (!) 340 lb (154.2 kg), SpO2 98 %. Body mass index is 44.86 kg/m. General:  Well Developed, well nourished, appropriate for stated age.  Neuro:  Alert and oriented,  extra-ocular muscles intact  HEENT:  Normocephalic, atraumatic, neck supple, no carotid bruits appreciated  Skin:  no gross rash, warm, pink. Cardiac:  RRR, S1 S2 Respiratory:  ECTA B/L and A/P, Not using accessory muscles, speaking in full sentences- unlabored. Vascular:  Ext warm, no cyanosis apprec.; cap RF less 2 sec. Psych:  No HI/SI, judgement and insight good, Euthymic mood. Full Affect.

## 2018-04-29 ENCOUNTER — Ambulatory Visit (INDEPENDENT_AMBULATORY_CARE_PROVIDER_SITE_OTHER): Payer: Medicare Other | Admitting: Podiatry

## 2018-04-29 DIAGNOSIS — M2041 Other hammer toe(s) (acquired), right foot: Secondary | ICD-10-CM

## 2018-04-29 LAB — CBC WITH DIFFERENTIAL/PLATELET
Basophils Absolute: 0 10*3/uL (ref 0.0–0.2)
Basos: 1 %
EOS (ABSOLUTE): 0.2 10*3/uL (ref 0.0–0.4)
EOS: 5 %
HEMATOCRIT: 35.7 % — AB (ref 37.5–51.0)
Hemoglobin: 12.7 g/dL — ABNORMAL LOW (ref 13.0–17.7)
IMMATURE GRANULOCYTES: 0 %
Immature Grans (Abs): 0 10*3/uL (ref 0.0–0.1)
Lymphocytes Absolute: 1.3 10*3/uL (ref 0.7–3.1)
Lymphs: 32 %
MCH: 31.4 pg (ref 26.6–33.0)
MCHC: 35.6 g/dL (ref 31.5–35.7)
MCV: 88 fL (ref 79–97)
MONOS ABS: 0.5 10*3/uL (ref 0.1–0.9)
Monocytes: 12 %
NEUTROS PCT: 50 %
Neutrophils Absolute: 2 10*3/uL (ref 1.4–7.0)
PLATELETS: 207 10*3/uL (ref 150–450)
RBC: 4.04 x10E6/uL — AB (ref 4.14–5.80)
RDW: 13 % (ref 12.3–15.4)
WBC: 4 10*3/uL (ref 3.4–10.8)

## 2018-04-29 LAB — COMPREHENSIVE METABOLIC PANEL
ALK PHOS: 70 IU/L (ref 39–117)
ALT: 42 IU/L (ref 0–44)
AST: 31 IU/L (ref 0–40)
Albumin/Globulin Ratio: 1.7 (ref 1.2–2.2)
Albumin: 4.4 g/dL (ref 3.6–4.8)
BUN/Creatinine Ratio: 15 (ref 10–24)
BUN: 13 mg/dL (ref 8–27)
Bilirubin Total: 0.4 mg/dL (ref 0.0–1.2)
CO2: 24 mmol/L (ref 20–29)
CREATININE: 0.88 mg/dL (ref 0.76–1.27)
Calcium: 9.8 mg/dL (ref 8.6–10.2)
Chloride: 103 mmol/L (ref 96–106)
GFR calc Af Amer: 105 mL/min/{1.73_m2} (ref >59)
GFR calc non Af Amer: 91 mL/min/{1.73_m2} (ref >59)
GLOBULIN, TOTAL: 2.6 g/dL (ref 1.5–4.5)
GLUCOSE: 116 mg/dL — AB (ref 65–99)
Potassium: 5.3 mmol/L — ABNORMAL HIGH (ref 3.5–5.2)
Sodium: 143 mmol/L (ref 134–144)
Total Protein: 7 g/dL (ref 6.0–8.5)

## 2018-04-29 LAB — B12 AND FOLATE PANEL
Folate: >20 ng/mL (ref >3.0)
VITAMIN B 12: 474 pg/mL (ref 232–1245)

## 2018-04-29 LAB — PHOSPHORUS: PHOSPHORUS: 3.3 mg/dL (ref 2.5–4.5)

## 2018-04-29 LAB — MAGNESIUM: MAGNESIUM: 1.9 mg/dL (ref 1.6–2.3)

## 2018-04-29 NOTE — Progress Notes (Signed)
  Subjective:  Patient ID: Robert [name], male    DOB: 1955-07-24,  MRN: [id number]  No chief complaint on file.  62 y.o. male returns today for planned flexor tenotomy of the right 2nd digit.  Objective:  There were no vitals filed for this visit.  General AA&O x3. Normal mood and affect.  Vascular Pedal pulses palpable.  Neurologic Epicritic sensation grossly intact.  Dermatologic Pre-ulcerative callus at the tip of the right, 2nd toe  Orthopedic: Semi-reducible hammertoe deformity right, 2nd toe    Assessment & Plan:  Patient was evaluated and treated and all questions answered.  Hammertoe R 2nd toe with pre-ulcerative callus -Flexor tenotomy as below. -Advised to remove the dressing in 24 hours and apply a band-aid and triple abx ointment every day thereafter.  Procedure: Flexor Tenotomy Indication for Procedure: toe with semi-reducible hammertoe with distal tip ulceration. Flexor tenotomy indicated to alleviate contracture, reduce pressure, and enhance healing of the ulceration. Location: right, 2nd toe Anesthesia: Lidocaine 1% plain; 1.5 mL and Marcaine 0.5% plain; 1.5 mL digital block Instrumentation: 18 g needle  Technique: The toe was anesthetized as above and prepped in the usual fashion. The toe was exsanquinated and a tourniquet was secured at the base of the toe. An 18g needle was then used to percutaneously release the flexor tendon at the plantar surface of the toe with noted release of the hammertoe deformity. The incision was then dressed with antibiotic ointment and band-aid. Compression splint dressing applied. Patient tolerated the procedure well. Dressing: Dry, sterile, compression dressing. Disposition: Patient tolerated procedure well. Patient to return in 1 week for follow-up.    Return in about 2 weeks (around 05/13/2018) for tenotomy.

## 2018-04-30 ENCOUNTER — Ambulatory Visit (INDEPENDENT_AMBULATORY_CARE_PROVIDER_SITE_OTHER): Payer: Medicare Other | Admitting: Internal Medicine

## 2018-04-30 ENCOUNTER — Ambulatory Visit: Payer: Medicare Other | Admitting: Family Medicine

## 2018-04-30 DIAGNOSIS — R768 Other specified abnormal immunological findings in serum: Secondary | ICD-10-CM

## 2018-04-30 DIAGNOSIS — M869 Osteomyelitis, unspecified: Secondary | ICD-10-CM

## 2018-04-30 DIAGNOSIS — R7689 Other specified abnormal immunological findings in serum: Secondary | ICD-10-CM

## 2018-04-30 MED ORDER — AMOXICILLIN-POT CLAVULANATE 875-125 MG PO TABS: 1.0000 | ORAL_TABLET | Freq: Two times a day (BID) | ORAL | 0 refills | Status: AC

## 2018-04-30 NOTE — Progress Notes (Signed)
[facility] for Infectious Disease  Patient Active Problem List   Diagnosis Date Noted  . Osteomyelitis of second toe of right foot ([place]) 04/10/2018    Priority: High  . Recurrent cellulitis of lower extremity 04/07/2018    Priority: High  . Hepatitis C antibody test positive 04/16/2018    Priority: Medium  . Pancytopenia ([place]) 03/15/2018    Priority: Medium  . Hyperlipidemia 04/08/2018  . Chronic neuropathy of both feet-  due to lumbar radiculopathy as well as diabetic neuropathy 03/25/2018  . DJD (degenerative joint disease) 01/09/2018  . Type 2 diabetes mellitus with complication, without long-term current use of insulin ([place]) 09/30/2017  . Essential hypertension 09/30/2017  . Mixed diabetic hyperlipidemia associated with type 2 diabetes mellitus ([place]) 09/30/2017  . Barrett's esophagus 12/07/2016  . Chronic venous stasis dermatitis of both lower extremities 05/08/2016  . Vitamin D deficiency 05/08/2016  . OSA on CPAP- seen Dr Melvyn Novas in past 04/11/2016  . Bilateral lower extremity pitting edema 04/11/2016  . Physical deconditioning 04/11/2016  . Restrictive lung disease secondary to obesity 04/11/2016  . History of tobacco use 04/11/2016  . h/o Depressive disorder 04/11/2016  . Lumbar radiculopathy 07/21/2012  . Chronic pain due to injury 07/21/2012  . Morbid obesity BMI >er 48 02/15/2012  . Postlaminectomy syndrome, lumbar region 01/04/2012  . h/o Narcotic abuse 01/04/2012  . Constipation 09/22/2011  . Chronic back pain 09/22/2011    Patient's Medications  New Prescriptions   No medications on file  Previous Medications   ALBUTEROL (PROVENTIL HFA;VENTOLIN HFA) 108 (90 BASE) MCG/ACT INHALER    Inhale 2 puffs into the lungs every 6 (six) hours as needed for wheezing or shortness of breath.   APPLE CIDER VINEGAR PO    Take 15 mLs by mouth 2 (two) times daily.    ATORVASTATIN (LIPITOR) 20 MG TABLET    TAKE 1 TABLET BY MOUTH EVERYDAY AT BEDTIME   B COMPLEX  VITAMINS (B COMPLEX 100 PO)    Take 1 capsule by mouth every other day.    BLOOD GLUCOSE METER KIT AND SUPPLIES    Dispense based on patient and insurance preference. Use to check glucose fasting in the AM and then after largest meal of the day.. (FOR ICD-10 E10.9, E11.9).   BLOOD GLUCOSE METER KIT AND SUPPLIES KIT    Dispense based on patient and insurance preference. Use up to four times daily as directed. (FOR ICD-9 250.00, 250.01).   BLOOD GLUCOSE MONITORING SUPPL (ONE TOUCH ULTRA MINI) W/DEVICE KIT    Use to test blood sugar, test in the morning fasting and test after largest meal of the day.   BUTALBITAL-ACETAMINOPHEN-CAFFEINE (FIORICET, ESGIC) 50-325-40 MG TABLET    Take 1 tablet by mouth every 6 (six) hours as needed for headache.   CALCIUM-MAGNESIUM-ZINC PO    Take 1 tablet by mouth every other day.   CHOLECALCIFEROL (VITAMIN D3) 5000 UNITS TABS    5,000 IU OTC vitamin D3 daily.   CYCLOBENZAPRINE (FLEXERIL) 10 MG TABLET    TAKE 1 TABLET BY MOUTH TWICE A DAY   DANDELION PO    Take 1 tablet by mouth every other day.   GLUCOSE BLOOD (ONE TOUCH ULTRA TEST) TEST STRIP    Use to test blood sugar, test in the morning fasting and test after largest meal of the day.   MELOXICAM (MOBIC) 15 MG TABLET    Take 1 tablet (15 mg total) by mouth daily.  METFORMIN (GLUCOPHAGE) 500 MG TABLET    Take 2 tablets (1,000 mg total) by mouth 2 (two) times daily with a meal.   MUPIROCIN CREAM (BACTROBAN) 2 %    Apply topically daily.   MUPIROCIN OINTMENT (BACTROBAN) 2 %    Apply 1 application topically daily.   NAPHAZOLINE-PHENIRAMINE (NAPHCON-A) 0.025-0.3 % OPHTHALMIC SOLUTION    Place 1 drop into both eyes 4 (four) times daily as needed for eye irritation.   OLMESARTAN-AMLODIPINE-HCTZ 20-5-12.5 MG TABS    Take 1 tablet by mouth daily.   ONETOUCH DELICA LANCETS 33G MISC    Use for testing blood sugar, test once in the morning fasting and test after largest meal of the day.   POTASSIUM 99 MG TABS    Take 1 tablet by  mouth every other day.    TRAMADOL (ULTRAM) 50 MG TABLET    Take 100 mg by mouth 4 (four) times daily. For pain   TURMERIC CURCUMIN PO    Take 1 tablet by mouth every other day.   UNABLE TO FIND    Take 1,600 mg by mouth 3 (three) times daily. [place] grape   VITAMIN B-12 1000 MCG TABLET    Take 1 tablet (1,000 mcg total) by mouth daily.   VITAMIN C (ASCORBIC ACID) 500 MG TABLET    Take 500 mg by mouth daily.   VITAMIN D, ERGOCALCIFEROL, (DRISDOL) 50000 UNITS CAPS CAPSULE    TAKE 1 CAPSULE WEEKLY   VITAMIN E PO    Take 1 tablet by mouth daily.  Modified Medications   Modified Medication Previous Medication   AMOXICILLIN-CLAVULANATE (AUGMENTIN) 875-125 MG TABLET amoxicillin-clavulanate (AUGMENTIN) 875-125 MG tablet      Take 1 tablet by mouth every 12 (twelve) hours for 28 days.    Take 1 tablet by mouth every 12 (twelve) hours for 28 days.  Discontinued Medications   No medications on file    Subjective: Robert [name] is in for his routine follow-up visit.  He is now completed 24 days of oral amoxicillin clavulanate for his right leg cellulitis and right second toe osteomyelitis.  He has not had any problems tolerating his antibiotic.  He is feeling better.  He had a right second toe tendon release by Dr. Price yesterday.  Review of Systems: Review of Systems  Constitutional: Negative for chills, diaphoresis and fever.  Gastrointestinal: Negative for abdominal pain, diarrhea, nausea and vomiting.  Musculoskeletal: Negative for joint pain.  Skin: Negative for rash.    Past Medical History:  Diagnosis Date  . Allergic rhinitis due to pollen   . Arthritis   . Asthma   . Back pain   . Barrett esophagus   . Depressive disorder   . Diabetes mellitus type 2 in obese (HCC)   . Exposure to hepatitis B   . Exposure to hepatitis C   . Narcotic abuse (HCC)    pain medications  . OSA (obstructive sleep apnea)    on CPAP     Social History   Tobacco Use  . Smoking status: Former Smoker     Packs/day: 0.80    Years: 15.00    Pack years: 12.00    Types: Cigarettes    Last attempt to quit: 07/16/2004    Years since quitting: 13.7  . Smokeless tobacco: Never Used  Substance Use Topics  . Alcohol use: No  . Drug use: No    Comment: Prior abuse of narcotics    Family History  Problem Relation Age of Onset  .   Allergies Mother   . Early death Father   . Thyroid disease Sister   . Colon cancer Neg Hx   . Stomach cancer Neg Hx     No Known Allergies  Objective: Vitals:   04/30/18 1417  BP: 127/76  Pulse: 71  Temp: 98.2 F (36.8 C)  TempSrc: Oral  Weight: (!) 345 lb (156.5 kg)   Body mass index is 45.52 kg/m.  Physical Exam  Constitutional: He is oriented to person, place, and time.  He is in good spirits.  Musculoskeletal:  He has a shallow ulcer on the tip of his right second toe.  There is no exposed bone.  There is no drainage or odor. The cellulitis of his toe has resolved.  He has some dry skin on his right lower leg.  He has some erythema that is probably due to stasis dermatitis rather than recurrent cellulitis.  Neurological: He is alert and oriented to person, place, and time.  Skin: No rash noted.  Psychiatric: He has a normal mood and affect.    Lab Results    Problem List Items Addressed This Visit      High   Osteomyelitis of second toe of right foot [facility])    He is responding well to empiric therapy for right second toe osteomyelitis.  I plan on having him complete 6 weeks of therapy.  He will follow-up in 2-1/2 weeks.        Medium   Hepatitis C antibody test positive    His hepatitis C viral load was undetectable reflecting spontaneous clearing of his infection.  He does not need any further evaluation or treatment.  I also note that his recent liver enzymes and platelets were completely normal.          Michel Bickers, MD [facility] for [place] [phone] pager   [phone] cell 04/30/2018,  2:59 PM

## 2018-04-30 NOTE — Assessment & Plan Note (Signed)
He is responding well to empiric therapy for right second toe osteomyelitis.  I plan on having him complete 6 weeks of therapy.  He will follow-up in 2-1/2 weeks.

## 2018-04-30 NOTE — Assessment & Plan Note (Signed)
His hepatitis C viral load was undetectable reflecting spontaneous clearing of his infection.  He does not need any further evaluation or treatment.  I also note that his recent liver enzymes and platelets were completely normal.

## 2018-05-01 ENCOUNTER — Other Ambulatory Visit: Payer: Self-pay

## 2018-05-01 NOTE — Patient Outreach (Signed)
Transition of care:  Placed call to patient for weekly follow up. No answer.  PLAN: left a message, requesting a call back. Will call back in 24 hours.  Rowe Pavy, RN, BSN, CEN [facility] [employer] [phone]

## 2018-05-02 ENCOUNTER — Other Ambulatory Visit: Payer: Self-pay

## 2018-05-02 NOTE — Patient Outreach (Signed)
Transition of care:  Placed call to patient who reports that he is doing well. Reports that he got his tendon released and everything is going well. Followed up with infectious disease this week and will continue on antibiotics for another 2 weeks for a total of 6 week.   No new issues or concerns today.  PLAN: will follow up with patient in 1 week sooner if needed.  Rowe Pavy, RN, BSN, CEN [facility] [employer] [phone]

## 2018-05-08 ENCOUNTER — Other Ambulatory Visit: Payer: Self-pay

## 2018-05-08 NOTE — Patient Outreach (Signed)
Transition of care:  Placed call to patient who reports he is doing well. Reports wound is healing.    Denies any new problems or concerns today.  PLAN: Will call patient back in 1 week.  Rowe Pavy, RN, BSN, CEN [facility] [employer] [phone]

## 2018-05-09 ENCOUNTER — Other Ambulatory Visit: Payer: Self-pay | Admitting: Family Medicine

## 2018-05-09 DIAGNOSIS — E118 Type 2 diabetes mellitus with unspecified complications: Secondary | ICD-10-CM

## 2018-05-13 ENCOUNTER — Encounter: Payer: Medicare Other | Admitting: Podiatry

## 2018-05-15 ENCOUNTER — Other Ambulatory Visit: Payer: Self-pay

## 2018-05-15 NOTE — Patient Outreach (Signed)
Transition of care:  Placed call to patient. No answer. Recording came on stating the phone was not working.  PLAN: will attempt again tomorrow.  Rowe Pavy, RN, BSN, CEN [facility] [employer] [phone]

## 2018-05-16 ENCOUNTER — Other Ambulatory Visit: Payer: Self-pay

## 2018-05-16 NOTE — Patient Outreach (Signed)
Transition of care:  Placed call to patient who reports that he is doing well. Reports toes is healing very well. Reports no swelling or signs of infection.   Reports that he toe is more straight now after procedure.  PLAN: will contact patient in 1 week for final transition of care. Encouraged patient to call sooner if needed.  Reviewed pending follow up appointments as listed in medical record.  Rowe Pavy, RN, BSN, CEN [facility] [employer] [phone]

## 2018-05-19 ENCOUNTER — Ambulatory Visit (INDEPENDENT_AMBULATORY_CARE_PROVIDER_SITE_OTHER): Payer: Medicare Other | Admitting: Podiatry

## 2018-05-19 DIAGNOSIS — E11621 Type 2 diabetes mellitus with foot ulcer: Secondary | ICD-10-CM

## 2018-05-19 DIAGNOSIS — L97511 Non-pressure chronic ulcer of other part of right foot limited to breakdown of skin: Secondary | ICD-10-CM

## 2018-05-19 NOTE — Progress Notes (Addendum)
Subjective:  Patient ID: Robert [name], male    DOB: 01-02-1956,  MRN: [id number]  Chief Complaint  Patient presents with  . Hammer Toe    F/U R tenotomy and pre-ulcerative callus Pt. stated," it's good, the toe is healing up and its's much straighter thatn it was." Tx: abx cream and bandaid -pt deneis N/V/F?Ch -w/ light redness   DOS: 04/29/18 Procedure: R 2nd Toe Flexor Tenotomy  62 y.o. male presents  for diabetic foot care. History as above.    Past Medical History:  Diagnosis Date  . Allergic rhinitis due to pollen   . Arthritis   . Asthma   . Back pain   . Barrett esophagus   . Depressive disorder   . Diabetes mellitus type 2 in obese ([place])   . Exposure to hepatitis B   . Exposure to hepatitis C   . Narcotic abuse (HCC)    pain medications  . OSA (obstructive sleep apnea)    on CPAP     Current Outpatient Medications:  .  albuterol (PROVENTIL HFA;VENTOLIN HFA) 108 (90 Base) MCG/ACT inhaler, Inhale 2 puffs into the lungs every 6 (six) hours as needed for wheezing or shortness of breath., Disp: , Rfl:  .  amoxicillin-clavulanate (AUGMENTIN) 875-125 MG tablet, Take 1 tablet by mouth every 12 (twelve) hours for 28 days., Disp: 60 tablet, Rfl: 0 .  APPLE CIDER VINEGAR PO, Take 15 mLs by mouth 2 (two) times daily. , Disp: , Rfl:  .  atorvastatin (LIPITOR) 20 MG tablet, TAKE 1 TABLET BY MOUTH EVERYDAY AT BEDTIME (Patient taking differently: Take 20 mg by mouth daily at 6 PM. ), Disp: 90 tablet, Rfl: 0 .  B Complex Vitamins (B COMPLEX 100 PO), Take 1 capsule by mouth every other day. , Disp: , Rfl:  .  blood glucose meter kit and supplies KIT, Dispense based on patient and insurance preference. Use up to four times daily as directed. (FOR ICD-9 250.00, 250.01)., Disp: 1 each, Rfl: 0 .  blood glucose meter kit and supplies, Dispense based on patient and insurance preference. Use to check glucose fasting in the AM and then after largest meal of the day.. (FOR ICD-10 E10.9, E11.9).,  Disp: 1 each, Rfl: 0 .  Blood Glucose Monitoring Suppl (ONE TOUCH ULTRA MINI) w/Device KIT, Use to test blood sugar, test in the morning fasting and test after largest meal of the day., Disp: 1 each, Rfl: 0 .  butalbital-acetaminophen-caffeine (FIORICET, ESGIC) 50-325-40 MG tablet, Take 1 tablet by mouth every 6 (six) hours as needed for headache., Disp: 14 tablet, Rfl: 0 .  CALCIUM-MAGNESIUM-ZINC PO, Take 1 tablet by mouth every other day., Disp: , Rfl:  .  Cholecalciferol (VITAMIN D3) 5000 units TABS, 5,000 IU OTC vitamin D3 daily. (Patient taking differently: Take 5,000 Units by mouth daily. ), Disp: 90 tablet, Rfl: 3 .  cyclobenzaprine (FLEXERIL) 10 MG tablet, TAKE 1 TABLET BY MOUTH TWICE A DAY, Disp: 60 tablet, Rfl: 1 .  DANDELION PO, Take 1 tablet by mouth every other day., Disp: , Rfl:  .  glucose blood (ONE TOUCH ULTRA TEST) test strip, Use to test blood sugar, test in the morning fasting and test after largest meal of the day., Disp: 100 each, Rfl: 11 .  meloxicam (MOBIC) 15 MG tablet, Take 1 tablet (15 mg total) by mouth daily. (Patient taking differently: Take 15 mg by mouth daily as needed for pain. ), Disp: 90 tablet, Rfl: 1 .  metFORMIN (GLUCOPHAGE) 500  MG tablet, TAKE 2 TABLETS (1,000 MG TOTAL) BY MOUTH 2 (TWO) TIMES DAILY WITH A MEAL., Disp: 360 tablet, Rfl: 1 .  mupirocin cream (BACTROBAN) 2 %, Apply topically daily., Disp: 15 g, Rfl: 0 .  mupirocin ointment (BACTROBAN) 2 %, Apply 1 application topically daily., Disp: , Rfl: 0 .  naphazoline-pheniramine (NAPHCON-A) 0.025-0.3 % ophthalmic solution, Place 1 drop into both eyes 4 (four) times daily as needed for eye irritation., Disp: , Rfl:  .  Olmesartan-amLODIPine-HCTZ 20-5-12.5 MG TABS, Take 1 tablet by mouth daily., Disp: , Rfl:  .  ONETOUCH DELICA LANCETS 44R MISC, Use for testing blood sugar, test once in the morning fasting and test after largest meal of the day., Disp: 100 each, Rfl: 11 .  Potassium 99 MG TABS, Take 1 tablet by  mouth every other day. , Disp: , Rfl:  .  traMADol (ULTRAM) 50 MG tablet, Take 100 mg by mouth 4 (four) times daily. For pain, Disp: , Rfl:  .  TURMERIC CURCUMIN PO, Take 1 tablet by mouth every other day., Disp: , Rfl:  .  UNABLE TO FIND, Take 1,600 mg by mouth 3 (three) times daily. [place] grape, Disp: , Rfl:  .  vitamin B-12 1000 MCG tablet, Take 1 tablet (1,000 mcg total) by mouth daily., Disp: 30 tablet, Rfl: 0 .  vitamin C (ASCORBIC ACID) 500 MG tablet, Take 500 mg by mouth daily., Disp: , Rfl:  .  Vitamin D, Ergocalciferol, (DRISDOL) 50000 units CAPS capsule, TAKE 1 CAPSULE WEEKLY (Patient taking differently: Take 50,000 Units by mouth every Friday. ), Disp: 12 capsule, Rfl: 4 .  VITAMIN E PO, Take 1 tablet by mouth daily., Disp: , Rfl:   Social History   Tobacco Use  Smoking Status Former Smoker  . Packs/day: 0.80  . Years: 15.00  . Pack years: 12.00  . Types: Cigarettes  . Last attempt to quit: 07/16/2004  . Years since quitting: 13.8  Smokeless Tobacco Never Used    No Known Allergies Objective:   There were no vitals filed for this visit. There is no height or weight on file to calculate BMI. Constitutional Well developed. Well nourished.  Vascular Dorsalis pedis pulses present 1+ bilaterally  Posterior tibial pulses present 1+ bilaterally  Pedal hair growth absent. Capillary refill normal to all digits.  No cyanosis or clubbing noted.  Neurologic Normal speech. Oriented to person, place, and time. Epicritic sensation to light touch grossly present bilaterally. Protective sensation with 5.07 monofilament  absent bilaterally. Vibratory sensation absent bilaterally.  Dermatologic Nails elongated, thickened, dystrophic. R 2nd toe distal tip ulcer measuring 0.2 in diameter without probe to bone. Wound bed granular. Small serous drainage. No warmth. No erythema. No signs of active infection.  Orthopedic: Normal joint ROM without pain or crepitus bilaterally. Digital  contractures bilaterally. Flexible. R 2nd toe alignment improved but still slight contracture at the PIPJ. No bony tenderness.   Assessment:   1. Diabetic ulcer of toe of right foot associated with type 2 diabetes mellitus, limited to breakdown of skin ([place])    Plan:  Patient was evaluated and treated and all questions answered.  S/p R 2nd toe flexor tenotomy -Procedure site healed, toe alignment improved.  R 2nd Toe Ulceration -Ulcer improving -Debrided as below.  Procedure: Excisional Debridement of Wound Rationale: Removal of non-viable soft tissue from the wound to promote healing.  Anesthesia: none Pre-Debridement Wound Measurements: 0.3 cm x 0.3 cm x 0.2 cm  Post-Debridement Wound Measurements: 0.3 cm x 0.3  cm x 0.2 cm  Type of Debridement: Sharp Excisional Tissue Removed: Non-viable soft tissue Depth of Debridement: subcutaneous tissue. Technique: Sharp excisional debridement to bleeding, viable wound base.  Dressing: Dry, sterile, compression dressing. Disposition: Patient tolerated procedure well. Patient to return in 1 week for follow-up.  Return in about 2 weeks (around 06/02/2018) for right 2nd toe ulcer f/u.

## 2018-05-22 ENCOUNTER — Other Ambulatory Visit: Payer: Self-pay

## 2018-05-22 NOTE — Patient Outreach (Signed)
Last transition of care call and case closure:  Placed call to patient who answered and reports that he is doing well. Reports no new problems and declines any further needs.  Reports toe is healing well and he had follow up with MD this week.  PLAN: encouraged patient to continue to report any changes in condition and call [facility] if needed in the future. Patient agreed. Will mail case closure letter and inform MD of case closure.  Rowe Pavy, RN, BSN, CEN [facility] [phone]

## 2018-05-23 DIAGNOSIS — M47816 Spondylosis without myelopathy or radiculopathy, lumbar region: Secondary | ICD-10-CM | POA: Diagnosis not present

## 2018-05-23 DIAGNOSIS — M25569 Pain in unspecified knee: Secondary | ICD-10-CM | POA: Diagnosis not present

## 2018-05-23 DIAGNOSIS — Z79899 Other long term (current) drug therapy: Secondary | ICD-10-CM | POA: Diagnosis not present

## 2018-05-23 DIAGNOSIS — Z79891 Long term (current) use of opiate analgesic: Secondary | ICD-10-CM | POA: Diagnosis not present

## 2018-05-23 DIAGNOSIS — G894 Chronic pain syndrome: Secondary | ICD-10-CM | POA: Diagnosis not present

## 2018-06-02 ENCOUNTER — Ambulatory Visit: Payer: Medicare Other | Admitting: Podiatry

## 2018-06-03 ENCOUNTER — Ambulatory Visit: Payer: Medicare Other | Admitting: Internal Medicine

## 2018-06-06 ENCOUNTER — Other Ambulatory Visit: Payer: Medicare Other

## 2018-06-06 DIAGNOSIS — E782 Mixed hyperlipidemia: Secondary | ICD-10-CM

## 2018-06-06 DIAGNOSIS — E559 Vitamin D deficiency, unspecified: Secondary | ICD-10-CM | POA: Diagnosis not present

## 2018-06-06 DIAGNOSIS — E1169 Type 2 diabetes mellitus with other specified complication: Secondary | ICD-10-CM | POA: Diagnosis not present

## 2018-06-06 DIAGNOSIS — E118 Type 2 diabetes mellitus with unspecified complications: Secondary | ICD-10-CM | POA: Diagnosis not present

## 2018-06-07 ENCOUNTER — Telehealth: Payer: Self-pay | Admitting: Infectious Diseases

## 2018-06-07 LAB — LIPID PANEL
CHOL/HDL RATIO: 4.8 ratio (ref 0.0–5.0)
Cholesterol, Total: 162 mg/dL (ref 100–199)
HDL: 34 mg/dL — ABNORMAL LOW (ref >39)
LDL Calculated: 97 mg/dL (ref 0–99)
TRIGLYCERIDES: 156 mg/dL — AB (ref 0–149)
VLDL Cholesterol Cal: 31 mg/dL (ref 5–40)

## 2018-06-07 LAB — T4, FREE: FREE T4: 0.92 ng/dL (ref 0.82–1.77)

## 2018-06-07 LAB — HEMOGLOBIN A1C
Est. average glucose Bld gHb Est-mCnc: 123 mg/dL
Hgb A1c MFr Bld: 5.9 % — ABNORMAL HIGH (ref 4.8–5.6)

## 2018-06-07 LAB — VITAMIN D 25 HYDROXY (VIT D DEFICIENCY, FRACTURES): VIT D 25 HYDROXY: 37.2 ng/mL (ref 30.0–100.0)

## 2018-06-07 LAB — TSH: TSH: 1.63 u[IU]/mL (ref 0.450–4.500)

## 2018-06-07 NOTE — Telephone Encounter (Signed)
Pt called that he is having worsening cellulitis, chils while on augmentin,.   I advised him to go to ED

## 2018-06-08 ENCOUNTER — Inpatient Hospital Stay (HOSPITAL_BASED_OUTPATIENT_CLINIC_OR_DEPARTMENT_OTHER)
Admission: EM | Admit: 2018-06-08 | Discharge: 2018-06-12 | DRG: 580 | Disposition: A | Discharge status: Home / Self Care | Payer: Medicare Other | Attending: Internal Medicine | Admitting: Internal Medicine

## 2018-06-08 ENCOUNTER — Other Ambulatory Visit: Payer: Self-pay

## 2018-06-08 ENCOUNTER — Emergency Department (HOSPITAL_BASED_OUTPATIENT_CLINIC_OR_DEPARTMENT_OTHER): Payer: Medicare Other

## 2018-06-08 ENCOUNTER — Encounter (HOSPITAL_BASED_OUTPATIENT_CLINIC_OR_DEPARTMENT_OTHER): Payer: Self-pay | Admitting: Adult Health

## 2018-06-08 DIAGNOSIS — E118 Type 2 diabetes mellitus with unspecified complications: Secondary | ICD-10-CM | POA: Diagnosis present

## 2018-06-08 DIAGNOSIS — M86671 Other chronic osteomyelitis, right ankle and foot: Secondary | ICD-10-CM | POA: Diagnosis present

## 2018-06-08 DIAGNOSIS — G8929 Other chronic pain: Secondary | ICD-10-CM | POA: Diagnosis present

## 2018-06-08 DIAGNOSIS — J45909 Unspecified asthma, uncomplicated: Secondary | ICD-10-CM | POA: Diagnosis present

## 2018-06-08 DIAGNOSIS — Z6841 Body Mass Index (BMI) 40.0 and over, adult: Secondary | ICD-10-CM | POA: Diagnosis not present

## 2018-06-08 DIAGNOSIS — Z9989 Dependence on other enabling machines and devices: Secondary | ICD-10-CM

## 2018-06-08 DIAGNOSIS — M545 Low back pain: Secondary | ICD-10-CM | POA: Diagnosis not present

## 2018-06-08 DIAGNOSIS — L03119 Cellulitis of unspecified part of limb: Secondary | ICD-10-CM

## 2018-06-08 DIAGNOSIS — M19071 Primary osteoarthritis, right ankle and foot: Secondary | ICD-10-CM | POA: Diagnosis not present

## 2018-06-08 DIAGNOSIS — F1110 Opioid abuse, uncomplicated: Secondary | ICD-10-CM | POA: Diagnosis present

## 2018-06-08 DIAGNOSIS — E1159 Type 2 diabetes mellitus with other circulatory complications: Secondary | ICD-10-CM | POA: Diagnosis present

## 2018-06-08 DIAGNOSIS — I872 Venous insufficiency (chronic) (peripheral): Secondary | ICD-10-CM | POA: Diagnosis present

## 2018-06-08 DIAGNOSIS — L03115 Cellulitis of right lower limb: Secondary | ICD-10-CM | POA: Diagnosis not present

## 2018-06-08 DIAGNOSIS — E11621 Type 2 diabetes mellitus with foot ulcer: Secondary | ICD-10-CM | POA: Diagnosis present

## 2018-06-08 DIAGNOSIS — F329 Major depressive disorder, single episode, unspecified: Secondary | ICD-10-CM | POA: Diagnosis present

## 2018-06-08 DIAGNOSIS — E871 Hypo-osmolality and hyponatremia: Secondary | ICD-10-CM | POA: Diagnosis present

## 2018-06-08 DIAGNOSIS — E1169 Type 2 diabetes mellitus with other specified complication: Secondary | ICD-10-CM | POA: Diagnosis present

## 2018-06-08 DIAGNOSIS — Z96652 Presence of left artificial knee joint: Secondary | ICD-10-CM | POA: Diagnosis present

## 2018-06-08 DIAGNOSIS — E11628 Type 2 diabetes mellitus with other skin complications: Secondary | ICD-10-CM | POA: Diagnosis present

## 2018-06-08 DIAGNOSIS — E1140 Type 2 diabetes mellitus with diabetic neuropathy, unspecified: Secondary | ICD-10-CM | POA: Diagnosis present

## 2018-06-08 DIAGNOSIS — M869 Osteomyelitis, unspecified: Secondary | ICD-10-CM

## 2018-06-08 DIAGNOSIS — M79661 Pain in right lower leg: Secondary | ICD-10-CM | POA: Diagnosis not present

## 2018-06-08 DIAGNOSIS — Z205 Contact with and (suspected) exposure to viral hepatitis: Secondary | ICD-10-CM | POA: Diagnosis present

## 2018-06-08 DIAGNOSIS — Z79899 Other long term (current) drug therapy: Secondary | ICD-10-CM

## 2018-06-08 DIAGNOSIS — E785 Hyperlipidemia, unspecified: Secondary | ICD-10-CM | POA: Diagnosis present

## 2018-06-08 DIAGNOSIS — Z872 Personal history of diseases of the skin and subcutaneous tissue: Secondary | ICD-10-CM | POA: Diagnosis not present

## 2018-06-08 DIAGNOSIS — L97519 Non-pressure chronic ulcer of other part of right foot with unspecified severity: Secondary | ICD-10-CM | POA: Diagnosis present

## 2018-06-08 DIAGNOSIS — M7989 Other specified soft tissue disorders: Secondary | ICD-10-CM | POA: Diagnosis not present

## 2018-06-08 DIAGNOSIS — Z87891 Personal history of nicotine dependence: Secondary | ICD-10-CM

## 2018-06-08 DIAGNOSIS — M549 Dorsalgia, unspecified: Secondary | ICD-10-CM | POA: Diagnosis present

## 2018-06-08 DIAGNOSIS — I10 Essential (primary) hypertension: Secondary | ICD-10-CM | POA: Diagnosis present

## 2018-06-08 DIAGNOSIS — R600 Localized edema: Secondary | ICD-10-CM | POA: Diagnosis not present

## 2018-06-08 DIAGNOSIS — L84 Corns and callosities: Secondary | ICD-10-CM | POA: Diagnosis not present

## 2018-06-08 DIAGNOSIS — G4733 Obstructive sleep apnea (adult) (pediatric): Secondary | ICD-10-CM

## 2018-06-08 DIAGNOSIS — L089 Local infection of the skin and subcutaneous tissue, unspecified: Secondary | ICD-10-CM | POA: Diagnosis present

## 2018-06-08 LAB — GLUCOSE, CAPILLARY: Glucose-Capillary: 126 mg/dL — ABNORMAL HIGH (ref 70–99)

## 2018-06-08 LAB — COMPREHENSIVE METABOLIC PANEL
ALBUMIN: 4.2 g/dL (ref 3.5–5.0)
ALT: 43 U/L (ref 0–44)
ANION GAP: 9 (ref 5–15)
AST: 31 U/L (ref 15–41)
Alkaline Phosphatase: 55 U/L (ref 38–126)
BILIRUBIN TOTAL: 1.1 mg/dL (ref 0.3–1.2)
BUN: 20 mg/dL (ref 8–23)
CO2: 23 mmol/L (ref 22–32)
Calcium: 9.1 mg/dL (ref 8.9–10.3)
Chloride: 102 mmol/L (ref 98–111)
Creatinine, Ser: 1.02 mg/dL (ref 0.61–1.24)
GFR calc non Af Amer: >60 mL/min (ref >60)
GLUCOSE: 152 mg/dL — AB (ref 70–99)
POTASSIUM: 3.7 mmol/L (ref 3.5–5.1)
SODIUM: 134 mmol/L — AB (ref 135–145)
Total Protein: 7.7 g/dL (ref 6.5–8.1)

## 2018-06-08 LAB — I-STAT CG4 LACTIC ACID, ED
Lactic Acid, Venous: 2 mmol/L (ref 0.5–1.9)
Lactic Acid, Venous: 1.27 mmol/L (ref 0.5–1.9)

## 2018-06-08 LAB — CBC WITH DIFFERENTIAL/PLATELET
Abs Immature Granulocytes: 0.02 10*3/uL (ref 0.00–0.07)
Basophils Absolute: 0 10*3/uL (ref 0.0–0.1)
Basophils Relative: 0 %
Eosinophils Absolute: 0.2 10*3/uL (ref 0.0–0.5)
Eosinophils Relative: 2 %
HCT: 37 % — ABNORMAL LOW (ref 39.0–52.0)
Hemoglobin: 12.7 g/dL — ABNORMAL LOW (ref 13.0–17.0)
Immature Granulocytes: 0 %
Lymphocytes Relative: 8 %
Lymphs Abs: 0.7 10*3/uL (ref 0.7–4.0)
MCH: 30.7 pg (ref 26.0–34.0)
MCHC: 34.3 g/dL (ref 30.0–36.0)
MCV: 89.4 fL (ref 80.0–100.0)
Monocytes Absolute: 0.6 10*3/uL (ref 0.1–1.0)
Monocytes Relative: 8 %
Neutro Abs: 6.7 10*3/uL (ref 1.7–7.7)
Neutrophils Relative %: 82 %
Platelets: 154 10*3/uL (ref 150–400)
RBC: 4.14 MIL/uL — ABNORMAL LOW (ref 4.22–5.81)
RDW: 13.2 % (ref 11.5–15.5)
WBC: 8.2 10*3/uL (ref 4.0–10.5)
nRBC: 0 % (ref 0.0–0.2)

## 2018-06-08 MED ORDER — IRBESARTAN 150 MG PO TABS
150.0000 mg | ORAL_TABLET | Freq: Every day | ORAL | Status: DC
  Administered 2018-06-09 – 2018-06-12 (×3): 150 mg via ORAL
  Filled 2018-06-08 (×3): qty 1

## 2018-06-08 MED ORDER — HYDROCODONE-ACETAMINOPHEN 5-325 MG PO TABS
2.0000 | ORAL_TABLET | Freq: Once | ORAL | Status: AC
  Administered 2018-06-08: 2 via ORAL
  Filled 2018-06-08: qty 2

## 2018-06-08 MED ORDER — ACETAMINOPHEN 650 MG RE SUPP: 650.0000 mg | Freq: Four times a day (QID) | RECTAL | Status: DC | PRN

## 2018-06-08 MED ORDER — ATORVASTATIN CALCIUM 10 MG PO TABS
20.0000 mg | ORAL_TABLET | Freq: Every day | ORAL | Status: DC
  Administered 2018-06-08 – 2018-06-11 (×4): 20 mg via ORAL
  Filled 2018-06-08: qty 1
  Filled 2018-06-08 (×4): qty 2

## 2018-06-08 MED ORDER — CYCLOBENZAPRINE HCL 10 MG PO TABS
10.0000 mg | ORAL_TABLET | Freq: Two times a day (BID) | ORAL | Status: DC
  Administered 2018-06-08 – 2018-06-12 (×8): 10 mg via ORAL
  Filled 2018-06-08 (×8): qty 1

## 2018-06-08 MED ORDER — CLINDAMYCIN PHOSPHATE 600 MG/50ML IV SOLN
600.0000 mg | Freq: Three times a day (TID) | INTRAVENOUS | Status: DC
  Administered 2018-06-08 – 2018-06-12 (×11): 600 mg via INTRAVENOUS
  Filled 2018-06-08 (×16): qty 50

## 2018-06-08 MED ORDER — ALBUTEROL SULFATE HFA 108 (90 BASE) MCG/ACT IN AERS: 2.0000 | INHALATION_SPRAY | Freq: Four times a day (QID) | RESPIRATORY_TRACT | Status: DC | PRN

## 2018-06-08 MED ORDER — SODIUM CHLORIDE 0.9 % IV BOLUS
1000.0000 mL | Freq: Once | INTRAVENOUS | Status: AC
  Administered 2018-06-08: 1000 mL via INTRAVENOUS

## 2018-06-08 MED ORDER — FENTANYL CITRATE (PF) 100 MCG/2ML IJ SOLN
100.0000 ug | Freq: Once | INTRAMUSCULAR | Status: AC
  Administered 2018-06-08: 100 ug via INTRAVENOUS
  Filled 2018-06-08: qty 2

## 2018-06-08 MED ORDER — BISACODYL 5 MG PO TBEC: 5.0000 mg | DELAYED_RELEASE_TABLET | Freq: Every day | ORAL | Status: DC | PRN

## 2018-06-08 MED ORDER — ONDANSETRON HCL 4 MG/2ML IJ SOLN: 4.0000 mg | Freq: Four times a day (QID) | INTRAMUSCULAR | Status: DC | PRN

## 2018-06-08 MED ORDER — NAPHAZOLINE-PHENIRAMINE 0.025-0.3 % OP SOLN
1.0000 [drp] | Freq: Four times a day (QID) | OPHTHALMIC | Status: DC | PRN
  Filled 2018-06-08: qty 15

## 2018-06-08 MED ORDER — CLINDAMYCIN PHOSPHATE 600 MG/50ML IV SOLN
600.0000 mg | Freq: Once | INTRAVENOUS | Status: AC
  Administered 2018-06-08: 600 mg via INTRAVENOUS
  Filled 2018-06-08: qty 50

## 2018-06-08 MED ORDER — ALBUTEROL SULFATE (2.5 MG/3ML) 0.083% IN NEBU: 2.5000 mg | INHALATION_SOLUTION | Freq: Four times a day (QID) | RESPIRATORY_TRACT | Status: DC | PRN

## 2018-06-08 MED ORDER — SENNOSIDES-DOCUSATE SODIUM 8.6-50 MG PO TABS: 1.0000 | ORAL_TABLET | Freq: Every evening | ORAL | Status: DC | PRN

## 2018-06-08 MED ORDER — ONDANSETRON HCL 4 MG PO TABS: 4.0000 mg | ORAL_TABLET | Freq: Four times a day (QID) | ORAL | Status: DC | PRN

## 2018-06-08 MED ORDER — AMLODIPINE BESYLATE 5 MG PO TABS
5.0000 mg | ORAL_TABLET | Freq: Every day | ORAL | Status: DC
  Administered 2018-06-09 – 2018-06-12 (×3): 5 mg via ORAL
  Filled 2018-06-08 (×3): qty 1

## 2018-06-08 MED ORDER — ACETAMINOPHEN 325 MG PO TABS
650.0000 mg | ORAL_TABLET | Freq: Four times a day (QID) | ORAL | Status: DC | PRN
  Administered 2018-06-11: 650 mg via ORAL
  Filled 2018-06-08: qty 2

## 2018-06-08 MED ORDER — SODIUM CHLORIDE 0.9 % IV SOLN
INTRAVENOUS | Status: DC | PRN
  Administered 2018-06-08: 250 mL via INTRAVENOUS

## 2018-06-08 MED ORDER — SODIUM CHLORIDE 0.9 % IV SOLN
INTRAVENOUS | Status: AC
  Administered 2018-06-08: 23:00:00 via INTRAVENOUS

## 2018-06-08 MED ORDER — INSULIN ASPART 100 UNIT/ML ~~LOC~~ SOLN
0.0000 [IU] | Freq: Three times a day (TID) | SUBCUTANEOUS | Status: DC
  Administered 2018-06-09: 1 [IU] via SUBCUTANEOUS
  Administered 2018-06-09 – 2018-06-10 (×2): 2 [IU] via SUBCUTANEOUS
  Administered 2018-06-12: 1 [IU] via SUBCUTANEOUS
  Administered 2018-06-12: 2 [IU] via SUBCUTANEOUS

## 2018-06-08 MED ORDER — ENOXAPARIN SODIUM 80 MG/0.8ML ~~LOC~~ SOLN
80.0000 mg | Freq: Every day | SUBCUTANEOUS | Status: DC
  Administered 2018-06-08 – 2018-06-11 (×3): 80 mg via SUBCUTANEOUS
  Filled 2018-06-08 (×3): qty 0.8

## 2018-06-08 MED ORDER — ONDANSETRON HCL 4 MG/2ML IJ SOLN
4.0000 mg | Freq: Once | INTRAMUSCULAR | Status: AC
  Administered 2018-06-08: 4 mg via INTRAVENOUS
  Filled 2018-06-08: qty 2

## 2018-06-08 MED ORDER — TRAMADOL HCL 50 MG PO TABS
50.0000 mg | ORAL_TABLET | Freq: Four times a day (QID) | ORAL | Status: DC | PRN
  Administered 2018-06-10 – 2018-06-11 (×4): 50 mg via ORAL
  Filled 2018-06-08 (×5): qty 1

## 2018-06-08 MED ORDER — INSULIN ASPART 100 UNIT/ML ~~LOC~~ SOLN
0.0000 [IU] | Freq: Every day | SUBCUTANEOUS | Status: DC
  Administered 2018-06-11: 2 [IU] via SUBCUTANEOUS

## 2018-06-08 MED ORDER — MUPIROCIN CALCIUM 2 % EX CREA
TOPICAL_CREAM | Freq: Every day | CUTANEOUS | Status: DC
  Administered 2018-06-10 – 2018-06-11 (×2): via TOPICAL
  Filled 2018-06-08 (×3): qty 15

## 2018-06-08 MED ORDER — KETOROLAC TROMETHAMINE 15 MG/ML IJ SOLN
15.0000 mg | Freq: Four times a day (QID) | INTRAMUSCULAR | Status: DC | PRN
  Administered 2018-06-08 – 2018-06-12 (×4): 15 mg via INTRAVENOUS
  Filled 2018-06-08 (×4): qty 1

## 2018-06-08 NOTE — ED Notes (Signed)
[facility]

## 2018-06-08 NOTE — H&P (Signed)
History and Physical    Mann D [name] GYK:[id number] DOB: 10-20-1955 DOA: 06/08/2018  PCP: [name], DO   Patient coming from: Home   Chief Complaint: Fever, chills, and right leg redness, swelling, and pain   HPI: Robert [name] is a 62 y.o. male with medical history significant for asthma, OSA on CPAP, type 2 diabetes mellitus, chronic pain with history of narcotic abuse, and recurrent lower extremity cellulitis currently completing a 48-monthcourse of Augmentin for this, and now presenting to the emergency department for evaluation of fever and pain, swelling, and redness in the right lower extremity.  Patient reports a history of recurrent lower extremity cellulitis and states that he has been on Augmentin at the direction of infectious disease specialist, had nearly completed his 342-monthourse, but woke this morning with pain at the distal right leg, noted erythema and swelling, and later developed fever and chills.  His wife outlined the area of erythema with a pen, redness was noted to spread proximally throughout the day, pain also worsened, and the patient comes in for evaluation of this.  He denies any recent cough or increase in his chronic shortness of breath.  Reports that a chronic wound at the tip of his toe has improved significantly and denies any drainage.  MeSpryD Course: Upon arrival to the ED, patient is found to be afebrile, saturating well on room air, and with vitals otherwise normal.  Lower extremity ultrasound is limited but negative for significant occlusive femoral-popliteal DVT in the right lower leg.  Radiographs of the distal leg are notable for soft tissue swelling without acute osseous abnormality.  Chemistry panel features a slight hyponatremia and CBC is notable for slight chronic normocytic anemia.  Lactic acid was slightly elevated initially and normalized after 2 L of normal saline.  Patient was also treated with Norco and fentanyl, and  he was started on IV clindamycin.  [facility] of recurrent lower extremity cellulitis.  Review of Systems:  All other systems reviewed and apart from HPI, are negative.  Past Medical History:  Diagnosis Date  . Allergic rhinitis due to pollen   . Arthritis   . Asthma   . Back pain   . Barrett esophagus   . Depressive disorder   . [place]  . Exposure to hepatitis B   . Exposure to hepatitis C   . Narcotic abuse (HCC)    pain medications  . OSA (obstructive sleep apnea)    on CPAP     Past Surgical History:  Procedure Laterality Date  . ABDOMINAL SURGERY    . BACK SURGERY     Rods and screws in lumbar area  . COLONOSCOPY    . ESOPHAGOGASTRODUODENOSCOPY  multiple  . HERNIA REPAIR     6  . JOINT REPLACEMENT    . TOTAL KNEE ARTHROPLASTY  2009   left     reports that he quit smoking about 13 years ago. His smoking use included cigarettes. He has a 12.00 pack-year smoking history. He has never used smokeless tobacco. He reports that he does not drink alcohol or use drugs.  No Known Allergies  Family History  Problem Relation Age of Onset  . Allergies Mother   . Early death Father   . Thyroid disease Sister   . Colon cancer Neg Hx   . Stomach cancer Neg Hx  Prior to Admission medications   Medication Sig Start Date End Date Taking? Authorizing Provider  albuterol (PROVENTIL HFA;VENTOLIN HFA) 108 (90 Base) MCG/ACT inhaler Inhale 2 puffs into the lungs every 6 (six) hours as needed for wheezing or shortness of breath.    [provider]  APPLE CIDER VINEGAR PO Take 15 mLs by mouth 2 (two) times daily.     [provider]  atorvastatin (LIPITOR) 20 MG tablet TAKE 1 TABLET BY MOUTH EVERYDAY AT BEDTIME Patient taking differently: Take 20 mg by mouth daily at 6 PM.  12/23/17   Opalski, Neoma Laming, DO  B Complex Vitamins (B COMPLEX 100 PO) Take 1  capsule by mouth every other day.     [provider]  blood glucose meter kit and supplies KIT Dispense based on patient and insurance preference. Use up to four times daily as directed. (FOR ICD-9 250.00, 250.01). 05/09/17   Opalski, Deborah, DO  blood glucose meter kit and supplies Dispense based on patient and insurance preference. Use to check glucose fasting in the AM and then after largest meal of the day.. (FOR ICD-10 E10.9, E11.9). 12/04/17   Opalski, Neoma Laming, DO  Blood Glucose Monitoring Suppl (ONE TOUCH ULTRA MINI) w/Device KIT Use to test blood sugar, test in the morning fasting and test after largest meal of the day. 05/28/17   [name], DO  butalbital-acetaminophen-caffeine (FIORICET, ESGIC) [phone] MG tablet Take 1 tablet by mouth every 6 (six) hours as needed for headache. 03/17/18   Lavina Hamman, MD  CALCIUM-MAGNESIUM-ZINC PO Take 1 tablet by mouth every other day.    [provider]  Cholecalciferol (VITAMIN D3) 5000 units TABS 5,000 IU OTC vitamin D3 daily. Patient taking differently: Take 5,000 Units by mouth daily.  05/08/16   Opalski, Deborah, DO  cyclobenzaprine (FLEXERIL) 10 MG tablet TAKE 1 TABLET BY MOUTH TWICE A DAY 04/28/18   Opalski, Deborah, DO  DANDELION PO Take 1 tablet by mouth every other day.    [provider]  glucose blood (ONE TOUCH ULTRA TEST) test strip Use to test blood sugar, test in the morning fasting and test after largest meal of the day. 05/28/17   [name], DO  meloxicam (MOBIC) 15 MG tablet Take 1 tablet (15 mg total) by mouth daily. Patient taking differently: Take 15 mg by mouth daily as needed for pain.  01/09/18   Opalski, Neoma Laming, DO  metFORMIN (GLUCOPHAGE) 500 MG tablet TAKE 2 TABLETS (1,000 MG TOTAL) BY MOUTH 2 (TWO) TIMES DAILY WITH A MEAL. 05/09/18   Opalski, Neoma Laming, DO  mupirocin cream (BACTROBAN) 2 % Apply topically daily. 04/11/18   Raiford Noble Latif, DO  mupirocin ointment (BACTROBAN) 2 % Apply  1 application topically daily. 04/10/18   [provider]  naphazoline-pheniramine (NAPHCON-A) 0.025-0.3 % ophthalmic solution Place 1 drop into both eyes 4 (four) times daily as needed for eye irritation.    [provider]  Olmesartan-amLODIPine-HCTZ 20-5-12.5 MG TABS Take 1 tablet by mouth daily.    [provider]  [facility] DELICA LANCETS 44Y MISC Use for testing blood sugar, test once in the morning fasting and test after largest meal of the day. 05/28/17   [name], DO  Potassium 99 MG TABS Take 1 tablet by mouth every other day.     [provider]  traMADol (ULTRAM) 50 MG tablet Take 100 mg by mouth 4 (four) times daily. For pain    [provider]  TURMERIC CURCUMIN PO Take 1 tablet  by mouth every other day.    [provider]  UNABLE TO FIND Take 1,600 mg by mouth 3 (three) times daily. [place] grape    [provider]  vitamin B-12 1000 MCG tablet Take 1 tablet (1,000 mcg total) by mouth daily. 04/10/18   Raiford Noble Latif, DO  vitamin C (ASCORBIC ACID) 500 MG tablet Take 500 mg by mouth daily.    [provider]  Vitamin D, Ergocalciferol, (DRISDOL) 50000 units CAPS capsule TAKE 1 CAPSULE WEEKLY Patient taking differently: Take 50,000 Units by mouth every Friday.  08/05/17   Opalski, Deborah, DO  VITAMIN E PO Take 1 tablet by mouth daily.    [provider]    Physical Exam: Vitals:   06/08/18 1740 06/08/18 1836 06/08/18 1929 06/08/18 2010  BP: (!) 109/56  97/69 138/70  Pulse: 72  87 87  Resp: 16   20  Temp:  98.7 F (37.1 C) 98.9 F (37.2 C) 100 F (37.8 C)  TempSrc:  Oral Oral Oral  SpO2: 98%  97% 98%  Weight:      Height:        Constitutional: NAD, calm  Eyes: PERTLA, lids and conjunctivae normal ENMT: Mucous membranes are moist. Posterior pharynx clear of any exudate or lesions.   Neck: normal, supple, no masses, no thyromegaly Respiratory: clear to auscultation bilaterally, no  wheezing, no crackles. Normal respiratory effort.   Cardiovascular: S1 & S2 heard, regular rate and rhythm. No significant JVD. Abdomen: No distension, no tenderness, soft. Bowel sounds normal.  Musculoskeletal: no clubbing / cyanosis. No joint deformity upper and lower extremities.    Skin: Erythema, edema, heat, and tenderness to distal RLE up to the knee, sparing foot and ankle, without fluctuance or drainage. Chronic-appearing ulceration at tip of toe on right. Warm, dry, well-perfused. Neurologic: No facial asymmetry. Sensation intac. Strength 5/5 in all 4 limbs.  Psychiatric: Alert and oriented x 3. Calm, cooperative.    Labs on Admission: I have personally reviewed following labs and imaging studies  CBC: Recent Labs  Lab 06/08/18 1108  WBC 8.2  NEUTROABS 6.7  HGB 12.7*  HCT 37.0*  MCV 89.4  PLT 024   Basic Metabolic Panel: Recent Labs  Lab 06/08/18 1108  NA 134*  K 3.7  CL 102  CO2 23  GLUCOSE 152*  BUN 20  CREATININE 1.02  CALCIUM 9.1   GFR: Estimated Creatinine Clearance: 115.8 mL/min (by C-G formula based on SCr of 1.02 mg/dL). Liver Function Tests: Recent Labs  Lab 06/08/18 1108  AST 31  ALT 43  ALKPHOS 55  BILITOT 1.1  PROT 7.7  ALBUMIN 4.2   No results for input(s): LIPASE, AMYLASE in the last 168 hours. No results for input(s): AMMONIA in the last 168 hours. Coagulation Profile: No results for input(s): INR, PROTIME in the last 168 hours. Cardiac Enzymes: No results for input(s): CKTOTAL, CKMB, CKMBINDEX, TROPONINI in the last 168 hours. BNP (last 3 results) No results for input(s): PROBNP in the last 8760 hours. HbA1C: Recent Labs    06/06/18 0847  HGBA1C 5.9*   CBG: No results for input(s): GLUCAP in the last 168 hours. Lipid Profile: Recent Labs    06/06/18 0847  CHOL 162  HDL 34*  LDLCALC 97  TRIG 156*  CHOLHDL 4.8   Thyroid Function Tests: Recent Labs    06/06/18 0847  TSH 1.630  FREET4 0.92   Anemia Panel: No  results for input(s): VITAMINB12, FOLATE, FERRITIN, TIBC, IRON, RETICCTPCT  in the last 72 hours. Urine analysis:    Component Value Date/Time   COLORURINE YELLOW 03/15/2018 0235   APPEARANCEUR CLEAR 03/15/2018 0235   LABSPEC 1.021 03/15/2018 0235   PHURINE 5.0 03/15/2018 0235   GLUCOSEU NEGATIVE 03/15/2018 0235   HGBUR NEGATIVE 03/15/2018 0235   BILIRUBINUR NEGATIVE 03/15/2018 0235   KETONESUR NEGATIVE 03/15/2018 0235   PROTEINUR NEGATIVE 03/15/2018 0235   UROBILINOGEN 0.2 05/31/2012 1658   NITRITE NEGATIVE 03/15/2018 0235   LEUKOCYTESUR NEGATIVE 03/15/2018 0235   Sepsis Labs: '@LABRCNTIP'$ (procalcitonin:4,lacticidven:4) )No results found for this or any previous visit (from the past 240 hour(s)).   Radiological Exams on Admission: Dg Tibia/fibula Right  Result Date: 06/08/2018 CLINICAL DATA:  Acute RIGHT LOWER leg pain, swelling and fever. EXAM: RIGHT TIBIA AND FIBULA - 2 VIEW COMPARISON:  None. FINDINGS: Apparent soft tissue swelling noted. No fracture, subluxation, dislocation or evidence of acute osteomyelitis noted. No soft tissue gas is identified. No radiopaque foreign bodies are noted. Degenerative changes in the knee are present. IMPRESSION: 1. Soft tissue swelling without acute bony abnormality. Electronically Signed   By: Margarette Canada M.D.   On: 06/08/2018 14:06   US Venous Img Lower Right (dvt Study)  Result Date: 06/08/2018 CLINICAL DATA:  Right lower extremity current cellulitis, pain and edema EXAM: RIGHT LOWER EXTREMITY VENOUS DOPPLER ULTRASOUND TECHNIQUE: Gray-scale sonography with graded compression, as well as color Doppler and duplex ultrasound were performed to evaluate the lower extremity deep venous systems from the level of the common femoral vein and including the common femoral, femoral, profunda femoral, popliteal and calf veins including the posterior tibial, peroneal and gastrocnemius veins when visible. The superficial great saphenous vein was also  interrogated. Spectral Doppler was utilized to evaluate flow at rest and with distal augmentation maneuvers in the common femoral, femoral and popliteal veins. COMPARISON:  None. FINDINGS: Contralateral Common Femoral Vein: Respiratory phasicity is normal and symmetric with the symptomatic side. No evidence of thrombus. Normal compressibility. Common Femoral Vein: No evidence of thrombus. Normal compressibility, respiratory phasicity and response to augmentation. Saphenofemoral Junction: No evidence of thrombus. Normal compressibility and flow on color Doppler imaging. Profunda Femoral Vein: No evidence of thrombus. Normal compressibility and flow on color Doppler imaging. Femoral Vein: No evidence of thrombus. Normal compressibility, respiratory phasicity and response to augmentation. Popliteal Vein: No evidence of thrombus. Normal compressibility, respiratory phasicity and response to augmentation. Calf Veins: Limited assessment of the calf veins. Posterior tibial vein appears patent. Peroneal vein not visualized. Superficial Great Saphenous Vein: No evidence of thrombus. Normal compressibility. Venous Reflux:  None. Other Findings:  Peripheral calf edema noted. IMPRESSION: No significant occlusive right lower extremity femoropopliteal DVT. Limited assessment of the calf veins. Peripheral subcutaneous edema. Electronically Signed   By: Jerilynn Mages.  Shick M.D.   On: 06/08/2018 13:07    EKG: Not performed.   Assessment/Plan  1. Recurrent RLE cellulitis  - Presents with subjective fevers with redness, heat, and pain involving the lower right leg where he has had cellulitis previously and was just finishing a 49-monthcourse of Augmentin for this - There is a chronic ulcer at tip of toe on right, no other wounds noted and no underlying prosthesis   - Afebrile in ED without leukocytosis, slight elevation in lactate, no underlying DVT or osseous findings on [place]  - Started on clindamycin in ED  -  Continue IV clindamycin and supportive care, follow clinical response to treatment   2. Chronic pain  - Complaining of RLE pain  from cellulitis  - Has reported hx of narcotic abuse  - Continue Flexeril and Ultram as at home, use as-needed Toradol in place of Mobic for now   3. Hypertension  - BP at goal  - Hold HCTZ while hydrating, continue ARB and Norvasc   4. Type II DM  - A1c was only 5.9% this month  - Managed with metformin at home, held on admission  - Check CBG's and use a low-intensity SSI with Novolog as needed while in hospital     DVT prophylaxis: Lovenox Code Status: Full  Family Communication: Discussed with patient  Consults called: None Admission status: Inpatient     Vianne Bulls, MD [facility] Pager [phone]  If 7PM-7AM, please contact night-coverage [email or web address] Password [facility]  06/08/2018, 10:33 PM

## 2018-06-08 NOTE — ED Notes (Signed)
LM [facility] Wallace [facility]Bree[facility]. [facility] en route.

## 2018-06-08 NOTE — ED Notes (Signed)
Meal Given per PA approval.

## 2018-06-08 NOTE — ED Provider Notes (Signed)
[facility] EMERGENCY DEPARTMENT Provider Note   CSN: [id number] Arrival date & time: 06/08/18  1038     History   Chief Complaint Chief Complaint  Patient presents with  . Fever    HPI Robert [name] is a 62 y.o. male history of asthma, cellulitis, diabetes, narcotic abuse, exposure to hepatitis who presents for evaluation of 2 days of right lower extremity pain, redness, swelling.  He does report that he has a history of right lower extremity cellulitis the last 4 months.  He was admitted to the hospital in September 2018 and started on a course of IV antibiotics.  Infectious disease consulted and transition him to p.o. Augmentin which he has been taking since then.  He was discharged home with a course of 3 months of p.o. Augmentin which she states he is scheduled to finish over the next day.  He has an appointment scheduled with ID next week.  He reports that about 2 days ago, he started noticing some swelling, redness, pain to the right lower extremity.  Additionally, he had a fever of 101.9.  He states that he marked the area on his leg which was more towards the distal aspect and states that over last 24 hours, is started extending proximately.  He also reports some pain to the medial aspect of his right thigh.  He reports no recent hospitalizations other than the one in September.  No surgeries.  Patient denies any chest pain, difficulty breathing, nausea/vomiting, abdominal pain.  The history is provided by the patient.    Past Medical History:  Diagnosis Date  . Allergic rhinitis due to pollen   . Arthritis   . Asthma   . Back pain   . Barrett esophagus   . Depressive disorder   . Diabetes mellitus type 2 in obese ([place])   . Exposure to hepatitis B   . Exposure to hepatitis C   . Narcotic abuse ([facility])    pain medications  . OSA (obstructive sleep apnea)    on CPAP     Patient Active Problem List   Diagnosis Date Noted  . Lower extremity cellulitis 06/08/2018   . Hepatitis C antibody test positive 04/16/2018  . Osteomyelitis of second toe of right foot ([place]) 04/10/2018  . Hyperlipidemia 04/08/2018  . Recurrent cellulitis of lower extremity 04/07/2018  . Chronic neuropathy of both feet-  due to lumbar radiculopathy as well as diabetic neuropathy 03/25/2018  . Pancytopenia ([place]) 03/15/2018  . DJD (degenerative joint disease) 01/09/2018  . Type 2 diabetes mellitus with complication, without long-term current use of insulin ([place]) 09/30/2017  . Essential hypertension 09/30/2017  . Mixed diabetic hyperlipidemia associated with type 2 diabetes mellitus ([place]) 09/30/2017  . Barrett's esophagus 12/07/2016  . Chronic venous stasis dermatitis of both lower extremities 05/08/2016  . Vitamin D deficiency 05/08/2016  . OSA on CPAP- seen Dr Melvyn Novas in past 04/11/2016  . Bilateral lower extremity pitting edema 04/11/2016  . Physical deconditioning 04/11/2016  . Restrictive lung disease secondary to obesity 04/11/2016  . History of tobacco use 04/11/2016  . h/o Depressive disorder 04/11/2016  . Lumbar radiculopathy 07/21/2012  . Chronic pain due to injury 07/21/2012  . Morbid obesity BMI >er 48 02/15/2012  . Postlaminectomy syndrome, lumbar region 01/04/2012  . h/o Narcotic abuse 01/04/2012  . Constipation 09/22/2011  . Chronic back pain 09/22/2011    Past Surgical History:  Procedure Laterality Date  . ABDOMINAL SURGERY    . BACK SURGERY  Rods and screws in lumbar area  . COLONOSCOPY    . ESOPHAGOGASTRODUODENOSCOPY  multiple  . HERNIA REPAIR     4  . JOINT REPLACEMENT    . TOTAL KNEE ARTHROPLASTY  2009   left        Home Medications    Prior to Admission medications   Medication Sig Start Date End Date Taking? Authorizing Provider  albuterol (PROVENTIL HFA;VENTOLIN HFA) 108 (90 Base) MCG/ACT inhaler Inhale 2 puffs into the lungs every 6 (six) hours as needed for wheezing or shortness of breath.    [provider]  APPLE CIDER  VINEGAR PO Take 15 mLs by mouth 2 (two) times daily.     [provider]  atorvastatin (LIPITOR) 20 MG tablet TAKE 1 TABLET BY MOUTH EVERYDAY AT BEDTIME Patient taking differently: Take 20 mg by mouth daily at 6 PM.  12/23/17   Opalski, Neoma Laming, DO  B Complex Vitamins (B COMPLEX 100 PO) Take 1 capsule by mouth every other day.     [provider]  blood glucose meter kit and supplies KIT Dispense based on patient and insurance preference. Use up to four times daily as directed. (FOR ICD-9 250.00, 250.01). 05/09/17   Opalski, Deborah, DO  blood glucose meter kit and supplies Dispense based on patient and insurance preference. Use to check glucose fasting in the AM and then after largest meal of the day.. (FOR ICD-10 E10.9, E11.9). 12/04/17   Opalski, Neoma Laming, DO  Blood Glucose Monitoring Suppl (ONE TOUCH ULTRA MINI) w/Device KIT Use to test blood sugar, test in the morning fasting and test after largest meal of the day. 05/28/17   [name], DO  butalbital-acetaminophen-caffeine (FIORICET, ESGIC) [phone] MG tablet Take 1 tablet by mouth every 6 (six) hours as needed for headache. 03/17/18   Lavina Hamman, MD  CALCIUM-MAGNESIUM-ZINC PO Take 1 tablet by mouth every other day.    [provider]  Cholecalciferol (VITAMIN D3) 5000 units TABS 5,000 IU OTC vitamin D3 daily. Patient taking differently: Take 5,000 Units by mouth daily.  05/08/16   Opalski, Deborah, DO  cyclobenzaprine (FLEXERIL) 10 MG tablet TAKE 1 TABLET BY MOUTH TWICE A DAY 04/28/18   Opalski, Deborah, DO  DANDELION PO Take 1 tablet by mouth every other day.    [provider]  glucose blood (ONE TOUCH ULTRA TEST) test strip Use to test blood sugar, test in the morning fasting and test after largest meal of the day. 05/28/17   [name], DO  meloxicam (MOBIC) 15 MG tablet Take 1 tablet (15 mg total) by mouth daily. Patient taking differently: Take 15 mg by mouth daily as needed for pain.   01/09/18   Opalski, Neoma Laming, DO  metFORMIN (GLUCOPHAGE) 500 MG tablet TAKE 2 TABLETS (1,000 MG TOTAL) BY MOUTH 2 (TWO) TIMES DAILY WITH A MEAL. 05/09/18   Opalski, Neoma Laming, DO  mupirocin cream (BACTROBAN) 2 % Apply topically daily. 04/11/18   Raiford Noble Latif, DO  mupirocin ointment (BACTROBAN) 2 % Apply 1 application topically daily. 04/10/18   [provider]  naphazoline-pheniramine (NAPHCON-A) 0.025-0.3 % ophthalmic solution Place 1 drop into both eyes 4 (four) times daily as needed for eye irritation.    [provider]  Olmesartan-amLODIPine-HCTZ 20-5-12.5 MG TABS Take 1 tablet by mouth daily.    [provider]  [facility] DELICA LANCETS 87G MISC Use for testing blood sugar, test once in the morning fasting and test after largest meal of the day. 05/28/17  [name], DO  Potassium 99 MG TABS Take 1 tablet by mouth every other day.     [provider]  traMADol (ULTRAM) 50 MG tablet Take 100 mg by mouth 4 (four) times daily. For pain    [provider]  TURMERIC CURCUMIN PO Take 1 tablet by mouth every other day.    [provider]  UNABLE TO FIND Take 1,600 mg by mouth 3 (three) times daily. [place] grape    [provider]  vitamin B-12 1000 MCG tablet Take 1 tablet (1,000 mcg total) by mouth daily. 04/10/18   Raiford Noble Latif, DO  vitamin C (ASCORBIC ACID) 500 MG tablet Take 500 mg by mouth daily.    [provider]  Vitamin D, Ergocalciferol, (DRISDOL) 50000 units CAPS capsule TAKE 1 CAPSULE WEEKLY Patient taking differently: Take 50,000 Units by mouth every Friday.  08/05/17   Opalski, Deborah, DO  VITAMIN E PO Take 1 tablet by mouth daily.    [provider]    Family History Family History  Problem Relation Age of Onset  . Allergies Mother   . Early death Father   . Thyroid disease Sister   . Colon cancer Neg Hx   . Stomach cancer Neg Hx     Social History Social History   Tobacco Use    . Smoking status: Former Smoker    Packs/day: 0.80    Years: 15.00    Pack years: 12.00    Types: Cigarettes    Last attempt to quit: 07/16/2004    Years since quitting: 13.9  . Smokeless tobacco: Never Used  Substance Use Topics  . Alcohol use: No  . Drug use: No    Comment: Prior abuse of narcotics     Allergies   Patient has no known allergies.   Review of Systems Review of Systems  Constitutional: Positive for fever.  Respiratory: Negative for cough and shortness of breath.   Cardiovascular: Negative for chest pain.  Gastrointestinal: Negative for abdominal pain, nausea and vomiting.  Genitourinary: Negative for dysuria and hematuria.  Skin: Positive for color change.  Neurological: Negative for headaches.  All other systems reviewed and are negative.    Physical Exam Updated Vital Signs BP 102/60 (BP Location: Right Arm)   Pulse 76   Temp 98.1 F (36.7 C) (Oral)   Resp 16   Ht 6' 2"  (1.88 m)   Wt (!) 156.5 kg   SpO2 98%   BMI 44.30 kg/m   Physical Exam  Constitutional: He is oriented to person, place, and time. He appears well-developed and well-nourished.  HENT:  Head: Normocephalic and atraumatic.  Mouth/Throat: Oropharynx is clear and moist and mucous membranes are normal.  Eyes: Pupils are equal, round, and reactive to light. Conjunctivae, EOM and lids are normal.  Neck: Full passive range of motion without pain.  Cardiovascular: Normal rate, regular rhythm, normal heart sounds and normal pulses. Exam reveals no gallop and no friction rub.  No murmur heard. Pulses:      Dorsalis pedis pulses are 2+ on the right side, and 2+ on the left side.  Pulmonary/Chest: Effort normal and breath sounds normal.  Lungs clear to auscultation bilaterally.  Symmetric chest rise.  No wheezing, rales, rhonchi.  Abdominal: Soft. Normal appearance. There is no tenderness. There is no rigidity and no guarding.  Abdomen is soft, non-distended, non-tender. No rigidity, No  guarding. No peritoneal signs.  Musculoskeletal: Normal range of motion.  Right lower extremity is  erythematous, warm to touch, slightly indurated.  He has erythema that extends from the distal ankle into the proximal tib-fib area.  He has a marking noted towards the distal aspect that shows from yesterday.  Diffuse tenderness palpation noted to the area.  No swelling noted to left lower extremity.  Neurological: He is alert and oriented to person, place, and time.  Sensation intact along major nerve distributions of BLE  Skin: Skin is warm and dry. Capillary refill takes less than 2 seconds.  Good distal cap refill.  RLE is not dusky in appearance or cool to touch.  Psychiatric: He has a normal mood and affect. His speech is normal.  Nursing note and vitals reviewed.      ED Treatments / Results  Labs (all labs ordered are listed, but only abnormal results are displayed) Labs Reviewed  COMPREHENSIVE METABOLIC PANEL - Abnormal; Notable for the following components:      Result Value   Sodium 134 (*)    Glucose, Bld 152 (*)    All other components within normal limits  CBC WITH DIFFERENTIAL/PLATELET - Abnormal; Notable for the following components:   RBC 4.14 (*)    Hemoglobin 12.7 (*)    HCT 37.0 (*)    All other components within normal limits  I-STAT CG4 LACTIC ACID, ED - Abnormal; Notable for the following components:   Lactic Acid, Venous 2.00 (*)    All other components within normal limits  I-STAT CG4 LACTIC ACID, ED    EKG None  Radiology Dg Tibia/fibula Right  Result Date: 06/08/2018 CLINICAL DATA:  Acute RIGHT LOWER leg pain, swelling and fever. EXAM: RIGHT TIBIA AND FIBULA - 2 VIEW COMPARISON:  None. FINDINGS: Apparent soft tissue swelling noted. No fracture, subluxation, dislocation or evidence of acute osteomyelitis noted. No soft tissue gas is identified. No radiopaque foreign bodies are noted. Degenerative changes in the knee are present. IMPRESSION: 1. Soft  tissue swelling without acute bony abnormality. Electronically Signed   By: Margarette Canada M.D.   On: 06/08/2018 14:06   US Venous Img Lower Right (dvt Study)  Result Date: 06/08/2018 CLINICAL DATA:  Right lower extremity current cellulitis, pain and edema EXAM: RIGHT LOWER EXTREMITY VENOUS DOPPLER ULTRASOUND TECHNIQUE: Gray-scale sonography with graded compression, as well as color Doppler and duplex ultrasound were performed to evaluate the lower extremity deep venous systems from the level of the common femoral vein and including the common femoral, femoral, profunda femoral, popliteal and calf veins including the posterior tibial, peroneal and gastrocnemius veins when visible. The superficial great saphenous vein was also interrogated. Spectral Doppler was utilized to evaluate flow at rest and with distal augmentation maneuvers in the common femoral, femoral and popliteal veins. COMPARISON:  None. FINDINGS: Contralateral Common Femoral Vein: Respiratory phasicity is normal and symmetric with the symptomatic side. No evidence of thrombus. Normal compressibility. Common Femoral Vein: No evidence of thrombus. Normal compressibility, respiratory phasicity and response to augmentation. Saphenofemoral Junction: No evidence of thrombus. Normal compressibility and flow on color Doppler imaging. Profunda Femoral Vein: No evidence of thrombus. Normal compressibility and flow on color Doppler imaging. Femoral Vein: No evidence of thrombus. Normal compressibility, respiratory phasicity and response to augmentation. Popliteal Vein: No evidence of thrombus. Normal compressibility, respiratory phasicity and response to augmentation. Calf Veins: Limited assessment of the calf veins. Posterior tibial vein appears patent. Peroneal vein not visualized. Superficial Great Saphenous Vein: No evidence of thrombus. Normal compressibility. Venous Reflux:  None. Other Findings:  Peripheral calf edema noted.  IMPRESSION: No significant  occlusive right lower extremity femoropopliteal DVT. Limited assessment of the calf veins. Peripheral subcutaneous edema. Electronically Signed   By: Jerilynn Mages.  Shick M.D.   On: 06/08/2018 13:07    Procedures Procedures (including critical care time)  Medications Ordered in ED Medications  0.9 %  sodium chloride infusion ( Intravenous Stopped 06/08/18 1623)  sodium chloride 0.9 % bolus 1,000 mL (0 mLs Intravenous Stopped 06/08/18 1302)  fentaNYL (SUBLIMAZE) injection 100 mcg (100 mcg Intravenous Given 06/08/18 1153)  ondansetron (ZOFRAN) injection 4 mg (4 mg Intravenous Given 06/08/18 1153)  sodium chloride 0.9 % bolus 1,000 mL (0 mLs Intravenous Stopped 06/08/18 1621)  fentaNYL (SUBLIMAZE) injection 100 mcg (100 mcg Intravenous Given 06/08/18 1434)  clindamycin (CLEOCIN) IVPB 600 mg (0 mg Intravenous Stopped 06/08/18 1622)     Initial Impression / Assessment and Plan / ED Course  I have reviewed the triage vital signs and the nursing notes.  Pertinent labs & imaging results that were available during my care of the patient were reviewed by me and considered in my medical decision making (see chart for details).     62 y.o. male possible history of hypertension, diabetes, cellulitis of right lower extremity who presents for evaluation of 2 days of redness, swelling, pain to right lower extremity.  Reports he was recently admitted in September 2018 for cellulitis and was discharged home on p.o. Augmentin which he states he has been taking.  States that 2 days ago, he had symptoms return.  Initially had fever 101.9.  No chest pain, difficulty breathing. Patient is afebrile, non-toxic appearing, sitting comfortably on examination table. Vital signs reviewed and stable.  On exam, right lower extremity is erythematous, edematous, painful to touch.  Good distal pulses. Patient is neurovascularly intact.  Concern for cellulitis.  Additionally, also concern for DVT.  We will plan to check labs,  ultrasound.  Lactic is slightly elevated at 2.00.  CMP is unremarkable.  CBC without any significant leukocytosis or anemia.  Ultrasound negative for DVT.  X-ray negative for any acute bony abnormality.  No evidence of subcutaneous emphysema that would be concerning for osteomyelitis.  Repeat lactic is improved after fluids.  Patient is already on p.o. antibiotics and had recurrence of his cellulitis, failing outpatient therapy.  Given fever, elevated lactic, concern the patient needs to come in for IV antibiotics and possible ID consult.  We will plan to consult hospitalist for admission.  Discussed with hospitalist.  Will plan for admission.  Final Clinical Impressions(s) / ED Diagnoses   Final diagnoses:  Cellulitis of right lower extremity    ED Discharge Orders    None       Volanda Napoleon, PA-C 06/08/18 1739    Gareth Morgan, MD 06/15/18 1324

## 2018-06-08 NOTE — Progress Notes (Signed)
BP (!) 109/56 (BP Location: Left Arm)   Pulse 72   Temp 98.1 F (36.7 C) (Oral)   Resp 18   Ht 6\' 2"  (1.88 m)   Wt (!) 156.5 kg   SpO2 100%   BMI 44.30 kg/m  62 y.o. male history of asthma, cellulitis, diabetes, narcotic abuse, exposure to hepatitis who presents for evaluation of 2 days of right lower extremity pain, redness, swelling.  He does report that he has a history of right lower extremity cellulitis the last 4 months. Saw ID as an outpatioent which he started him on augmentin, which he is still taking. Despite this the erythema cont to spread. Started on IV clindamycin. accepted to med-surg.

## 2018-06-08 NOTE — ED Notes (Signed)
ED Provider at bedside. 

## 2018-06-08 NOTE — Progress Notes (Signed)
RT note: RT reported a lactic acid of 2.0 to DR Schlossmon.

## 2018-06-08 NOTE — ED Triage Notes (Addendum)
PResents with fever and cellulitits of right leg that began 4 months ago. HE is currently taking Augmentin HE is frustrated that the cellulitis keeps coming back. Tmax at home 101.8 last night.Pt is very warm to touch.

## 2018-06-09 ENCOUNTER — Inpatient Hospital Stay (HOSPITAL_COMMUNITY): Payer: Medicare Other

## 2018-06-09 DIAGNOSIS — I872 Venous insufficiency (chronic) (peripheral): Secondary | ICD-10-CM

## 2018-06-09 DIAGNOSIS — Z96652 Presence of left artificial knee joint: Secondary | ICD-10-CM

## 2018-06-09 DIAGNOSIS — Z872 Personal history of diseases of the skin and subcutaneous tissue: Secondary | ICD-10-CM

## 2018-06-09 DIAGNOSIS — L03115 Cellulitis of right lower limb: Principal | ICD-10-CM

## 2018-06-09 DIAGNOSIS — L97519 Non-pressure chronic ulcer of other part of right foot with unspecified severity: Secondary | ICD-10-CM

## 2018-06-09 DIAGNOSIS — Z87891 Personal history of nicotine dependence: Secondary | ICD-10-CM

## 2018-06-09 DIAGNOSIS — L84 Corns and callosities: Secondary | ICD-10-CM

## 2018-06-09 DIAGNOSIS — E11621 Type 2 diabetes mellitus with foot ulcer: Secondary | ICD-10-CM

## 2018-06-09 LAB — GLUCOSE, CAPILLARY
GLUCOSE-CAPILLARY: 100 mg/dL — AB (ref 70–99)
GLUCOSE-CAPILLARY: 192 mg/dL — AB (ref 70–99)
GLUCOSE-CAPILLARY: 97 mg/dL (ref 70–99)
Glucose-Capillary: 131 mg/dL — ABNORMAL HIGH (ref 70–99)

## 2018-06-09 LAB — BASIC METABOLIC PANEL
ANION GAP: 7 (ref 5–15)
BUN: 18 mg/dL (ref 8–23)
CALCIUM: 8.9 mg/dL (ref 8.9–10.3)
CO2: 26 mmol/L (ref 22–32)
CREATININE: 0.86 mg/dL (ref 0.61–1.24)
Chloride: 105 mmol/L (ref 98–111)
GLUCOSE: 123 mg/dL — AB (ref 70–99)
Potassium: 3.9 mmol/L (ref 3.5–5.1)
Sodium: 138 mmol/L (ref 135–145)

## 2018-06-09 LAB — CBC WITH DIFFERENTIAL/PLATELET
Abs Immature Granulocytes: 0.01 10*3/uL (ref 0.00–0.07)
BASOS ABS: 0 10*3/uL (ref 0.0–0.1)
Basophils Relative: 0 %
EOS ABS: 0.2 10*3/uL (ref 0.0–0.5)
EOS PCT: 4 %
HCT: 32.8 % — ABNORMAL LOW (ref 39.0–52.0)
HEMOGLOBIN: 11 g/dL — AB (ref 13.0–17.0)
IMMATURE GRANULOCYTES: 0 %
Lymphocytes Relative: 22 %
Lymphs Abs: 1.2 10*3/uL (ref 0.7–4.0)
MCH: 30.6 pg (ref 26.0–34.0)
MCHC: 33.5 g/dL (ref 30.0–36.0)
MCV: 91.1 fL (ref 80.0–100.0)
MONOS PCT: 12 %
Monocytes Absolute: 0.6 10*3/uL (ref 0.1–1.0)
NEUTROS PCT: 62 %
NRBC: 0 % (ref 0.0–0.2)
Neutro Abs: 3.2 10*3/uL (ref 1.7–7.7)
Platelets: 136 10*3/uL — ABNORMAL LOW (ref 150–400)
RBC: 3.6 MIL/uL — ABNORMAL LOW (ref 4.22–5.81)
RDW: 13.1 % (ref 11.5–15.5)
WBC: 5.2 10*3/uL (ref 4.0–10.5)

## 2018-06-09 MED ORDER — GADOBUTROL 1 MMOL/ML IV SOLN
10.0000 mL | Freq: Once | INTRAVENOUS | Status: AC | PRN
  Administered 2018-06-09: 10 mL via INTRAVENOUS

## 2018-06-09 MED ORDER — LORAZEPAM 2 MG/ML IJ SOLN
1.0000 mg | Freq: Once | INTRAMUSCULAR | Status: AC
  Administered 2018-06-09: 1 mg via INTRAVENOUS
  Filled 2018-06-09: qty 1

## 2018-06-09 NOTE — Progress Notes (Signed)
PROGRESS NOTE                                                                                                                                                                                                             Patient Demographics:    Robert [name], is a 62 y.o. male, DOB - [birth year], ZOX:[id number]  Admit date - 06/08/2018   Admitting Physician Briscoe Deutscher, MD  Outpatient Primary MD for the patient is Thomasene Lot, DO  LOS - 1  Outpatient Specialists: Dr Orvan Falconer ( ID)  Chief Complaint  Patient presents with  . Fever       Brief Narrative   62 year old morbidly obese male with history of OSA on CPAP, asthma, prediabetic, chronic pain with history of narcotic abuse and recurrent right lower extremity cellulitis, who was treated initially with doxycycline and then hospitalized for recurrent cellulitis.  MRI of the foot showed early changes of osteomyelitis of the right second toe.  ID was consulted who recommended treating with 6 weeks of oral Augmentin.  Patient reports that he was completing his antibiotic course on the day of admission and has been adherent to it including monitoring his legs, wearing full-length trousers and stockings.  His cellulitis had completely resolved but for the past 2 days he noticed increasing pain with swelling and erythema of the right lower leg.  Also reports subjective fevers and chills.  He reported that the chronic wound on the tip of his right second toe has significantly improved, without any drainage or pain. In the ED his vitals were stable.  X-ray of the right tibia and fibula show soft tissue swelling.  Doppler of the lower extremity was negative for DVT. Lab work showed mild hyponatremia and mildly elevated lactic acid.  Received 2 L normal saline bolus.  He was started on empiric IV clindamycin and admitted to hospitalist service for recurrent cellulitis with failed  outpatient treatment.      Subjective:   Patient reports right leg pain to be slightly improved this morning and area of erythema has also reduced in size since admission.   Assessment  & Plan :    Principal Problem:   Recurrent cellulitis of lower extremity Unclear what triggered the recurrence since patient reports being adherent to his antibiotic and completed 6 weeks treatment of Augmentin yesterday. X-ray of  the right foot without findings of osteomyelitis on the second toe. Remains afebrile and WC normal.  Patient started on empiric IV clindamycin. Consulted ID who recommended x-ray of the foot and orthopedic consult given concern for history of osteomyelitis and recurrent cellulitis.  Active Problems:   Chronic back pain/ h/o Narcotic abuse Continue Flexeril and tramadol.  Essential hypertension Continue amlodipine  Hyperlipidemia Continue Lipitor    OSA on CPAP-  Continue nighttime CPAP    Type 2 diabetes mellitus, controlled, without long-term current use of insulin (HCC) Patient blood glucose in the past 2 years seem very well controlled and maximum A1c was 6.2.  Monitor on sliding scale coverage.  Morbid obesity Needs counseling on diet and exercise  HCV antibody positive Undetectable viral load.  Normal LFTs.  Code Status : Full code  Family Communication  : None at bedside  Disposition Plan  : Home once clinically improved, pending ID evaluation  Barriers For Discharge : Active symptoms  Consults  : ID  Procedures  : Doppler lower extremity  DVT Prophylaxis  :  Lovenox -   Lab Results  Component Value Date   PLT 136 (L) 06/09/2018    Antibiotics  :    Anti-infectives (From admission, onward)   Start     Dose/Rate Route Frequency Ordered Stop   06/08/18 2300  clindamycin (CLEOCIN) IVPB 600 mg     600 mg 100 mL/hr over 30 Minutes Intravenous Every 8 hours 06/08/18 2233     06/08/18 1430  clindamycin (CLEOCIN) IVPB 600 mg     600 mg 100  mL/hr over 30 Minutes Intravenous  Once 06/08/18 1425 06/08/18 1622        Objective:   Vitals:   06/08/18 1929 06/08/18 2010 06/09/18 0030 06/09/18 0353  BP: 97/69 138/70  117/83  Pulse: 87 87  67  Resp:  20 18 15   Temp: 98.9 F (37.2 C) 100 F (37.8 C)  97.9 F (36.6 C)  TempSrc: Oral Oral  Oral  SpO2: 97% 98%  95%  Weight:      Height:        Wt Readings from Last 3 Encounters:  06/08/18 (!) 156.5 kg  04/30/18 (!) 156.5 kg  04/28/18 (!) 154.2 kg     Intake/Output Summary (Last 24 hours) at 06/09/2018 1236 Last data filed at 06/09/2018 1150 Gross per 24 hour  Intake 2808.85 ml  Output 2100 ml  Net 708.85 ml     Physical Exam  Gen: not in distress HEENT: no pallor, moist mucosa, supple neck Chest: clear b/l, no added sounds CVS: N S1&S2, no murmurs,  GI: soft, NT, ND, BS+ Musculoskeletal: warm, swelling, warmth and erythema over the right lower leg extending from the ankle up to the upper one third of the tibia, area of erythema has reduced  since admission.  Minimal tenderness.  Distal pulses palpable.  Chronic ulceration over tip of the right second toe without drainage or tenderness.     Data Review:    CBC Recent Labs  Lab 06/08/18 1108 06/09/18 0436  WBC 8.2 5.2  HGB 12.7* 11.0*  HCT 37.0* 32.8*  PLT 154 136*  MCV 89.4 91.1  MCH 30.7 30.6  MCHC 34.3 33.5  RDW 13.2 13.1  LYMPHSABS 0.7 1.2  MONOABS 0.6 0.6  EOSABS 0.2 0.2  BASOSABS 0.0 0.0    Chemistries  Recent Labs  Lab 06/08/18 1108 06/09/18 0436  NA 134* 138  K 3.7 3.9  CL 102 105  CO2 23 26  GLUCOSE 152* 123*  BUN 20 18  CREATININE 1.02 0.86  CALCIUM 9.1 8.9  AST 31  --   ALT 43  --   ALKPHOS 55  --   BILITOT 1.1  --    ------------------------------------------------------------------------------------------------------------------ No results for input(s): CHOL, HDL, LDLCALC, TRIG, CHOLHDL, LDLDIRECT in the last 72 hours.  Lab Results  Component Value Date   HGBA1C  5.9 (H) 06/06/2018   ------------------------------------------------------------------------------------------------------------------ No results for input(s): TSH, T4TOTAL, T3FREE, THYROIDAB in the last 72 hours.  Invalid input(s): FREET3 ------------------------------------------------------------------------------------------------------------------ No results for input(s): VITAMINB12, FOLATE, FERRITIN, TIBC, IRON, RETICCTPCT in the last 72 hours.  Coagulation profile No results for input(s): INR, PROTIME in the last 168 hours.  No results for input(s): DDIMER in the last 72 hours.  Cardiac Enzymes No results for input(s): CKMB, TROPONINI, MYOGLOBIN in the last 168 hours.  Invalid input(s): CK ------------------------------------------------------------------------------------------------------------------ No results found for: BNP  Inpatient Medications  Scheduled Meds: . amLODipine  5 mg Oral Daily  . atorvastatin  20 mg Oral q1800  . cyclobenzaprine  10 mg Oral BID  . enoxaparin (LOVENOX) injection  80 mg Subcutaneous QHS  . insulin aspart  0-5 Units Subcutaneous QHS  . insulin aspart  0-9 Units Subcutaneous TID WC  . irbesartan  150 mg Oral Daily  . mupirocin cream   Topical Daily   Continuous Infusions: . clindamycin (CLEOCIN) IV 600 mg (06/09/18 0552)   PRN Meds:.acetaminophen **OR** acetaminophen, albuterol, bisacodyl, ketorolac, naphazoline-pheniramine, ondansetron **OR** ondansetron (ZOFRAN) IV, senna-docusate, traMADol  Micro Results No results found for this or any previous visit (from the past 240 hour(s)).  Radiology Reports Dg Tibia/fibula Right  Result Date: 06/08/2018 CLINICAL DATA:  Acute RIGHT LOWER leg pain, swelling and fever. EXAM: RIGHT TIBIA AND FIBULA - 2 VIEW COMPARISON:  None. FINDINGS: Apparent soft tissue swelling noted. No fracture, subluxation, dislocation or evidence of acute osteomyelitis noted. No soft tissue gas is identified. No  radiopaque foreign bodies are noted. Degenerative changes in the knee are present. IMPRESSION: 1. Soft tissue swelling without acute bony abnormality. Electronically Signed   By: Harmon PierJeffrey  Hu M.D.   On: 06/08/2018 14:06   [place] Venous Img Lower Right (dvt Study)  Result Date: 06/08/2018 CLINICAL DATA:  Right lower extremity current cellulitis, pain and edema EXAM: RIGHT LOWER EXTREMITY VENOUS DOPPLER ULTRASOUND TECHNIQUE: Gray-scale sonography with graded compression, as well as color Doppler and duplex ultrasound were performed to evaluate the lower extremity deep venous systems from the level of the common femoral vein and including the common femoral, femoral, profunda femoral, popliteal and calf veins including the posterior tibial, peroneal and gastrocnemius veins when visible. The superficial great saphenous vein was also interrogated. Spectral Doppler was utilized to evaluate flow at rest and with distal augmentation maneuvers in the common femoral, femoral and popliteal veins. COMPARISON:  None. FINDINGS: Contralateral Common Femoral Vein: Respiratory phasicity is normal and symmetric with the symptomatic side. No evidence of thrombus. Normal compressibility. Common Femoral Vein: No evidence of thrombus. Normal compressibility, respiratory phasicity and response to augmentation. Saphenofemoral Junction: No evidence of thrombus. Normal compressibility and flow on color Doppler imaging. Profunda Femoral Vein: No evidence of thrombus. Normal compressibility and flow on color Doppler imaging. Femoral Vein: No evidence of thrombus. Normal compressibility, respiratory phasicity and response to augmentation. Popliteal Vein: No evidence of thrombus. Normal compressibility, respiratory phasicity and response to augmentation. Calf Veins: Limited assessment of the calf veins. Posterior tibial vein appears patent. Peroneal vein not visualized.  Superficial Great Saphenous Vein: No evidence of thrombus. Normal  compressibility. Venous Reflux:  None. Other Findings:  Peripheral calf edema noted. IMPRESSION: No significant occlusive right lower extremity femoropopliteal DVT. Limited assessment of the calf veins. Peripheral subcutaneous edema. Electronically Signed   By: Judie Petit.  Shick M.D.   On: 06/08/2018 13:07   Dg Foot 2 Views Right  Result Date: 06/09/2018 CLINICAL DATA:  Osteomyelitis of right toe and foot EXAM: RIGHT FOOT - 2 VIEW COMPARISON:  None. FINDINGS: Advanced osteoarthritic changes at the 1st MTP joint. No fracture, subluxation or dislocation. No bony destructive changes seen to suggest or confirm osteomyelitis. Soft tissues are intact. 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 No radiographic changes of osteomyelitis. If clinical concern persists, MRI may be helpful. Electronically Signed   By: Charlett Nose M.D.   On: 06/09/2018 11:06    Time Spent in minutes  25   Lazarius Rivkin M.D on 06/09/2018 at 12:36 PM  Between 7am to 7pm - Pager - [phone]  After 7pm go to [email or web address] - password [facility]  [facility] -  Office  [phone]

## 2018-06-09 NOTE — Progress Notes (Signed)
Report called to [facility].

## 2018-06-09 NOTE — Consult Note (Signed)
Date of Admission:  06/08/2018          Reason for Consult: Recurrent cellulitis and diabetic foot ulcer with osteomyelitis on MRI    Referring Provider: Turgor Dhungel   Assessment:  1. Recurrent cellulitis of right lower extremity 2. Diabetic Foot ulcer with osteomyelitis seen on MRI  Plan:  1. Okay to continue clindamycin for now 2. Obtain MRI of tibia-fibula to look for pyomyositis in the left lower extremity an MRI of the foot to investigate area of previously identified osteomyelitis  Principal Problem:   Recurrent cellulitis of lower extremity Active Problems:   Chronic back pain   h/o Narcotic abuse   OSA on CPAP- seen Dr Sherene SiresWert in past   Chronic venous stasis dermatitis of both lower extremities   Type 2 diabetes mellitus with complication, without long-term current use of insulin (HCC)   Essential hypertension   Scheduled Meds: . amLODipine  5 mg Oral Daily  . atorvastatin  20 mg Oral q1800  . cyclobenzaprine  10 mg Oral BID  . enoxaparin (LOVENOX) injection  80 mg Subcutaneous QHS  . insulin aspart  0-5 Units Subcutaneous QHS  . insulin aspart  0-9 Units Subcutaneous TID WC  . irbesartan  150 mg Oral Daily  . mupirocin cream   Topical Daily   Continuous Infusions: . clindamycin (CLEOCIN) IV 600 mg (06/09/18 1504)   PRN Meds:.acetaminophen **OR** acetaminophen, albuterol, bisacodyl, ketorolac, naphazoline-pheniramine, ondansetron **OR** ondansetron (ZOFRAN) IV, senna-docusate, traMADol  HPI: Robert [name] is a 62 y.o. male  with morbid obesity and recently diagnosed type 2 diabetes.  He has had a chronic callus on his right second toe.  During the third week of August he and his family spent a long day at a water park.  His daughter noticed that his right foot was bleeding and he found that the callus had come off leaving a deep ulcer over the tip of his toe.  He has had intermittent clear drainage since that time.  He was admitted to the hospital from  03/14/2018 -03/17/2018 with right lower leg cellulitis.  He was discharged on a short course of oral cephalexin and doxycycline.  His cellulitis resolved but he was left with more swelling of his right lower leg than usual.  He began to develop recurrent fevers up to 103 degrees and was readmitted on 04/06/2018 with recurrent right leg cellulitis.  He was also noted to have redness and swelling of his right second toe.  MRI scan was done which showed marrow edema of the distal phalanx.  No bony destruction was seen.  My partner Dr. Orvan Falconerampbell in September 2019.  Dr. Orvan Falconerampbell prescribed him a 6-week course of Augmentin which she was in the midst of completing when he experienced erythema and his right lower extremity which progressed up his shin and into his thigh with pain in his muscles of his right calf as well as in his medial thigh.  He was admitted to the hospital service and placed on clindamycin with improvement in his cellulitis.  The area where he had previously a deep ulcer were osteomyelitis have been seen on MRI has been healing up recently and is not overtly purulent or tender.  Films repeated today do not show evidence of osteomyelitis in this area.  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  Review of Systems: Review of Systems  Constitutional: Positive for fever and malaise/fatigue. Negative for chills, diaphoresis and weight loss.  HENT: Negative for congestion, hearing loss, sore throat and tinnitus.   Eyes: Negative for blurred vision and double vision.  Respiratory: Negative for cough, sputum production, shortness of breath and wheezing.   Cardiovascular: Negative for chest pain, palpitations and leg swelling.  Gastrointestinal: Negative for abdominal pain, blood in stool, constipation, diarrhea, heartburn, melena, nausea and vomiting.  Genitourinary:  Negative for dysuria, flank pain and hematuria.  Musculoskeletal: Positive for myalgias. Negative for back pain, falls and joint pain.  Skin: Negative for itching and rash.  Neurological: Negative for dizziness, sensory change, focal weakness, loss of consciousness, weakness and headaches.  Endo/Heme/Allergies: Does not bruise/bleed easily.  Psychiatric/Behavioral: Negative for depression, memory loss and suicidal ideas. The patient is not nervous/anxious.     Past Medical History:  Diagnosis Date  . Allergic rhinitis due to pollen   . Arthritis   . Asthma   . Back pain   . Barrett esophagus   . Depressive disorder   . Diabetes mellitus type 2 in obese (HCC)   . Exposure to hepatitis B   . Exposure to hepatitis C   . Narcotic abuse (HCC)    pain medications  . OSA (obstructive sleep apnea)    on CPAP     Social History   Tobacco Use  . Smoking status: Former Smoker    Packs/day: 0.80    Years: 15.00    Pack years: 12.00    Types: Cigarettes    Last attempt to quit: 07/16/2004    Years since quitting: 13.9  . Smokeless tobacco: Never Used  Substance Use Topics  . Alcohol use: No  . Drug use: No    Comment: Prior abuse of narcotics    Family History  Problem Relation Age of Onset  . Allergies Mother   . Early death Father   . Thyroid disease Sister   . Colon cancer Neg Hx   . Stomach cancer Neg Hx    No Known Allergies  OBJECTIVE: Blood pressure (!) 90/54, pulse 70, temperature 98 F (36.7 C), temperature source Oral, resp. rate 18, height 6\' 2"  (1.88 m), weight (!) 156.6 kg, SpO2 98 %.  Physical Exam  Constitutional: He is oriented to person, place, and time. He appears well-developed and well-nourished. He is cooperative. He does not appear ill. No distress.  HENT:  Head: Normocephalic and atraumatic.  Right Ear: Hearing and external ear normal.  Left Ear: Hearing and external ear normal.  Nose: No rhinorrhea or nasal deformity. No epistaxis.  Eyes: Pupils  are equal, round, and reactive to light. Conjunctivae and EOM are normal. Right conjunctiva is not injected. Left conjunctiva is not injected. No scleral icterus.  Neck: Normal range of motion. Neck supple. No JVD present.  Cardiovascular: Normal rate, regular rhythm, S1 normal, S2 normal and normal heart sounds. Exam reveals no friction rub.  No murmur heard. Abdominal: Soft. Normal appearance and bowel sounds are normal. He exhibits no distension and no ascites. There is no hepatosplenomegaly. There is no tenderness.  Musculoskeletal: Normal range of motion.       Right shoulder: Normal.       Left shoulder: Normal.       Right hip: Normal.       Left hip: Normal.       Right knee: Normal.       Left knee: Normal.  Lymphadenopathy:  Head (right side): No submandibular, no preauricular and no posterior auricular adenopathy present.       Head (left side): No submandibular, no preauricular and no posterior auricular adenopathy present.    He has no cervical adenopathy.       Right cervical: No superficial cervical and no deep cervical adenopathy present.      Left cervical: No superficial cervical and no deep cervical adenopathy present.  Neurological: He is alert and oriented to person, place, and time. He has normal strength. No sensory deficit. Gait normal.  Skin: Skin is warm, dry and intact. No abrasion, no bruising, no ecchymosis, no lesion and no rash noted. He is not diaphoretic. There is erythema. No cyanosis. No pallor. Nails show no clubbing.  Psychiatric: He has a normal mood and affect. His speech is normal and behavior is normal. Judgment and thought content normal. Cognition and memory are normal. He is attentive.   Right lower extremity today on June 09, 2018:    Toe today June 09, 2018:      Lab Results Lab Results  Component Value Date   WBC 5.2 06/09/2018   HGB 11.0 (L) 06/09/2018   HCT 32.8 (L) 06/09/2018   MCV 91.1 06/09/2018   PLT 136 (L)  06/09/2018    Lab Results  Component Value Date   CREATININE 0.86 06/09/2018   BUN 18 06/09/2018   NA 138 06/09/2018   K 3.9 06/09/2018   CL 105 06/09/2018   CO2 26 06/09/2018    Lab Results  Component Value Date   ALT 43 06/08/2018   AST 31 06/08/2018   ALKPHOS 55 06/08/2018   BILITOT 1.1 06/08/2018     Microbiology: No results found for this or any previous visit (from the past 240 hour(s)).  Acey Lav, MD [facility] [phone] pager  06/09/2018, 3:27 PM

## 2018-06-10 ENCOUNTER — Ambulatory Visit (INDEPENDENT_AMBULATORY_CARE_PROVIDER_SITE_OTHER): Payer: Self-pay | Admitting: Physician Assistant

## 2018-06-10 LAB — MRSA PCR SCREENING: MRSA BY PCR: NEGATIVE

## 2018-06-10 LAB — GLUCOSE, CAPILLARY
GLUCOSE-CAPILLARY: 118 mg/dL — AB (ref 70–99)
GLUCOSE-CAPILLARY: 184 mg/dL — AB (ref 70–99)
Glucose-Capillary: 163 mg/dL — ABNORMAL HIGH (ref 70–99)
Glucose-Capillary: 81 mg/dL (ref 70–99)
Glucose-Capillary: 146 mg/dL — ABNORMAL HIGH (ref 70–99)

## 2018-06-10 MED ORDER — DEXTROSE 5 % IV SOLN
3.0000 g | INTRAVENOUS | Status: DC
  Filled 2018-06-10: qty 3000

## 2018-06-10 MED ORDER — ZOLPIDEM TARTRATE 5 MG PO TABS
5.0000 mg | ORAL_TABLET | Freq: Once | ORAL | Status: AC
  Administered 2018-06-10: 5 mg via ORAL
  Filled 2018-06-10: qty 1

## 2018-06-10 MED ORDER — CHLORHEXIDINE GLUCONATE 4 % EX LIQD
60.0000 mL | Freq: Once | CUTANEOUS | Status: AC
  Administered 2018-06-11: 4 via TOPICAL
  Filled 2018-06-10: qty 15
  Filled 2018-06-10: qty 60

## 2018-06-10 NOTE — Progress Notes (Signed)
Patient would like to place himself on CPAP, he wears one at home. Water added. RT will continue to monitor.

## 2018-06-10 NOTE — Progress Notes (Signed)
PROGRESS NOTE                                                                                                                                                                                                             Patient Demographics:    Robert [name], is a 62 y.o. male, DOB - 09-13-1955, ZOX:[id number]  Admit date - 06/08/2018   Admitting Physician Briscoe Deutscher, MD  Outpatient Primary MD for the patient is Thomasene Lot, DO  LOS - 2  Outpatient Specialists: Dr Orvan Falconer ( ID)  Chief Complaint  Patient presents with  . Fever       Brief Narrative   62 year old morbidly obese male with history of OSA on CPAP, asthma, prediabetic, chronic pain with history of narcotic abuse and recurrent right lower extremity cellulitis, who was treated initially with doxycycline and then hospitalized for recurrent cellulitis.  MRI of the foot showed early changes of osteomyelitis of the right second toe.  ID was consulted who recommended treating with 6 weeks of oral Augmentin.  Patient reports that he was completing his antibiotic course on the day of admission and has been adherent to it including monitoring his legs, wearing full-length trousers and stockings.  His cellulitis had completely resolved but for the past 2 days he noticed increasing pain with swelling and erythema of the right lower leg.  Also reports subjective fevers and chills.  He reported that the chronic wound on the tip of his right second toe has significantly improved, without any drainage or pain. In the ED his vitals were stable.  X-ray of the right tibia and fibula show soft tissue swelling.  Doppler of the lower extremity was negative for DVT. Lab work showed mild hyponatremia and mildly elevated lactic acid.  Received 2 L normal saline bolus.  He was started on empiric IV clindamycin and admitted to hospitalist service for recurrent cellulitis with failed  outpatient treatment.      Subjective:   Right leg pain and swelling better.  MRI findings discussed and patient agrees with surgical evaluation.   Assessment  & Plan :    Principal Problem:   Recurrent cellulitis of lower extremity Right second toe osteomyelitis. Patient completed 6 weeks course of oral Augmentin just prior to being admitted and at least 2 weeks of antibiotics prior to that. On empiric  IV clindamycin.  Area of cellulitis has quite improved since presentation. Lower extremity Doppler negative for DVT. MRI of the right tibia/fibula and right foot obtained after discussing with ID.  Shows changes of cellulitis on the tibia and fibula without myositis or osteomyelitis however shows persistent distal second toe osteomyelitis, without any abscess or fluid collection.  Discussed options of surgery with patient given persistent cellulitis (he is even followed by podiatrist as outpatient and has had debridement of the ulcer done recently).  Patient agrees on being evaluated by orthopedics.  Spoke with Dr. Lajoyce Corners who recommends transferring patient to [facility] and he will put patient on scheduled for tomorrow.  Active Problems:   Chronic back pain/ h/o Narcotic abuse Continue Flexeril and tramadol.  Essential hypertension Continue amlodipine  Hyperlipidemia Continue Lipitor    OSA on CPAP-  Continue nighttime CPAP    Type 2 diabetes mellitus, controlled, without long-term current use of insulin (HCC) Patient blood glucose in the past 2 years seem very well controlled and maximum A1c was 6.2.  Monitor on sliding scale coverage.  Morbid obesity Needs counseling on diet and exercise  HCV antibody positive Undetectable viral load.  Normal LFTs.  Code Status : Full code  Family Communication  : None at bedside.  Wife involved in care.  Disposition Plan  : Transfer to [facility] for orthopedics evaluation and possible OR tomorrow.  Barriers For Discharge : Active  symptoms  Consults  : ID, Dr. Lajoyce Corners (orthopedics)  Procedures  : Doppler lower extremity, MRI of right tibia and foot.  DVT Prophylaxis  :  Lovenox -   Lab Results  Component Value Date   PLT 136 (L) 06/09/2018    Antibiotics  :    Anti-infectives (From admission, onward)   Start     Dose/Rate Route Frequency Ordered Stop   06/08/18 2300  clindamycin (CLEOCIN) IVPB 600 mg     600 mg 100 mL/hr over 30 Minutes Intravenous Every 8 hours 06/08/18 2233     06/08/18 1430  clindamycin (CLEOCIN) IVPB 600 mg     600 mg 100 mL/hr over 30 Minutes Intravenous  Once 06/08/18 1425 06/08/18 1622        Objective:   Vitals:   06/09/18 1547 06/09/18 2306 06/09/18 2311 06/10/18 0549  BP: 124/71  132/67 137/79  Pulse: 66  70 66  Resp: 20 18 20 16   Temp: 98.3 F (36.8 C)  98.7 F (37.1 C) 97.7 F (36.5 C)  TempSrc: Oral  Oral Oral  SpO2: 98%  99% 96%  Weight:      Height:        Wt Readings from Last 3 Encounters:  06/09/18 (!) 156.6 kg  04/30/18 (!) 156.5 kg  04/28/18 (!) 154.2 kg     Intake/Output Summary (Last 24 hours) at 06/10/2018 0935 Last data filed at 06/10/2018 0600 Gross per 24 hour  Intake 626.8 ml  Output 2260 ml  Net -1633.2 ml    Physical exam Middle-aged obese male not in distress HEENT: Moist mucosa, supple neck Chest: Clear bilaterally CVS: Normal S1 and S2 GI: Soft, nondistended, nontender Muscular skeletal: Warm, erythema and swelling over the right lower leg extending throughout the anterior tibia, much improved since admission and tender today.  Clean ulceration over the tip of right second toe.     Data Review:    CBC Recent Labs  Lab 06/08/18 1108 06/09/18 0436  WBC 8.2 5.2  HGB 12.7* 11.0*  HCT 37.0* 32.8*  PLT 154 136*  MCV 89.4 91.1  MCH 30.7 30.6  MCHC 34.3 33.5  RDW 13.2 13.1  LYMPHSABS 0.7 1.2  MONOABS 0.6 0.6  EOSABS 0.2 0.2  BASOSABS 0.0 0.0    Chemistries  Recent Labs  Lab 06/08/18 1108 06/09/18 0436  NA 134*  138  K 3.7 3.9  CL 102 105  CO2 23 26  GLUCOSE 152* 123*  BUN 20 18  CREATININE 1.02 0.86  CALCIUM 9.1 8.9  AST 31  --   ALT 43  --   ALKPHOS 55  --   BILITOT 1.1  --    ------------------------------------------------------------------------------------------------------------------ No results for input(s): CHOL, HDL, LDLCALC, TRIG, CHOLHDL, LDLDIRECT in the last 72 hours.  Lab Results  Component Value Date   HGBA1C 5.9 (H) 06/06/2018   ------------------------------------------------------------------------------------------------------------------ No results for input(s): TSH, T4TOTAL, T3FREE, THYROIDAB in the last 72 hours.  Invalid input(s): FREET3 ------------------------------------------------------------------------------------------------------------------ No results for input(s): VITAMINB12, FOLATE, FERRITIN, TIBC, IRON, RETICCTPCT in the last 72 hours.  Coagulation profile No results for input(s): INR, PROTIME in the last 168 hours.  No results for input(s): DDIMER in the last 72 hours.  Cardiac Enzymes No results for input(s): CKMB, TROPONINI, MYOGLOBIN in the last 168 hours.  Invalid input(s): CK ------------------------------------------------------------------------------------------------------------------ No results found for: BNP  Inpatient Medications  Scheduled Meds: . amLODipine  5 mg Oral Daily  . atorvastatin  20 mg Oral q1800  . cyclobenzaprine  10 mg Oral BID  . enoxaparin (LOVENOX) injection  80 mg Subcutaneous QHS  . insulin aspart  0-5 Units Subcutaneous QHS  . insulin aspart  0-9 Units Subcutaneous TID WC  . irbesartan  150 mg Oral Daily  . mupirocin cream   Topical Daily   Continuous Infusions: . clindamycin (CLEOCIN) IV 600 mg (06/10/18 0747)   PRN Meds:.acetaminophen **OR** acetaminophen, albuterol, bisacodyl, ketorolac, naphazoline-pheniramine, ondansetron **OR** ondansetron (ZOFRAN) IV, senna-docusate, traMADol  Micro  Results No results found for this or any previous visit (from the past 240 hour(s)).  Radiology Reports Dg Tibia/fibula Right  Result Date: 06/08/2018 CLINICAL DATA:  Acute RIGHT LOWER leg pain, swelling and fever. EXAM: RIGHT TIBIA AND FIBULA - 2 VIEW COMPARISON:  None. FINDINGS: Apparent soft tissue swelling noted. No fracture, subluxation, dislocation or evidence of acute osteomyelitis noted. No soft tissue gas is identified. No radiopaque foreign bodies are noted. Degenerative changes in the knee are present. IMPRESSION: 1. Soft tissue swelling without acute bony abnormality. Electronically Signed   By: Harmon Pier M.D.   On: 06/08/2018 14:06   Mr Foot Right Wo Contrast  Result Date: 06/10/2018 CLINICAL DATA:  Second toe osteomyelitis. EXAM: MRI OF THE RIGHT FOREFOOT WITHOUT CONTRAST TECHNIQUE: Multiplanar, multisequence MR imaging of the right forefoot was performed. No intravenous contrast was administered. COMPARISON:  Right foot x-rays from same day. MRI right foot dated April 09, 2018. FINDINGS: Bones/Joint/Cartilage Unchanged marrow edema with corresponding T1 marrow hypointensity involving the tip of the second distal phalanx. No fracture or dislocation. Normal alignment. No joint effusion. 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 Unchanged mild third tarsometatarsal joint osteoarthritis. Ligaments Collateral ligaments are intact.  Lisfranc ligament is intact. Muscles and Tendons Flexor, peroneal and extensor compartment tendons are intact. Diffuse fatty atrophy of the intrinsic muscles of the forefoot. Soft tissue Small ulceration at the tip of the second toe. Improved soft tissue swelling of the second toe and dorsal forefoot. No fluid collection or hematoma. No soft tissue mass. IMPRESSION: 1. Small soft tissue ulceration at the  tip of the second toe with persistent osteomyelitis of the tip of the second distal phalanx. 2. Improved soft tissue swelling of the dorsal  forefoot and second toe which could reflect resolving cellulitis or third spacing. No abscess. Electronically Signed   By: Obie DredgeWilliam T Derry M.D.   On: 06/10/2018 08:07   Mr Tibia Fibula Right W Wo Contrast  Result Date: 06/10/2018 CLINICAL DATA:  Recurrent right lower leg cellulitis. EXAM: MRI OF LOWER RIGHT EXTREMITY WITHOUT AND WITH CONTRAST TECHNIQUE: Multiplanar, multisequence MR imaging of the right lower leg was performed both before and after administration of intravenous contrast. CONTRAST:  10 mL Gadavist intravenous contrast. COMPARISON:  Right tibia and fibula x-rays dated June 08, 2018. FINDINGS: Bones/Joint/Cartilage No marrow signal abnormality. No fracture or dislocation. Normal alignment. No joint effusion. Mild medial compartment osteoarthritis of the knee. Muscles and Tendons No muscle edema or atrophy. Soft tissue Extensive circumferential soft tissue swelling and enhancement of the mid to distal lower leg. No fluid collection or hematoma. No soft tissue mass. IMPRESSION: 1. Extensive circumferential soft tissue swelling and enhancement of the mid to distal lower leg, consistent with cellulitis. 2. No abscess or myositis. Electronically Signed   By: Obie DredgeWilliam T Derry M.D.   On: 06/10/2018 08:16   [place] Venous Img Lower Right (dvt Study)  Result Date: 06/08/2018 CLINICAL DATA:  Right lower extremity current cellulitis, pain and edema EXAM: RIGHT LOWER EXTREMITY VENOUS DOPPLER ULTRASOUND TECHNIQUE: Gray-scale sonography with graded compression, as well as color Doppler and duplex ultrasound were performed to evaluate the lower extremity deep venous systems from the level of the common femoral vein and including the common femoral, femoral, profunda femoral, popliteal and calf veins including the posterior tibial, peroneal and gastrocnemius veins when visible. The superficial great saphenous vein was also interrogated. Spectral Doppler was utilized to evaluate flow at rest and with distal  augmentation maneuvers in the common femoral, femoral and popliteal veins. COMPARISON:  None. FINDINGS: Contralateral Common Femoral Vein: Respiratory phasicity is normal and symmetric with the symptomatic side. No evidence of thrombus. Normal compressibility. Common Femoral Vein: No evidence of thrombus. Normal compressibility, respiratory phasicity and response to augmentation. Saphenofemoral Junction: No evidence of thrombus. Normal compressibility and flow on color Doppler imaging. Profunda Femoral Vein: No evidence of thrombus. Normal compressibility and flow on color Doppler imaging. Femoral Vein: No evidence of thrombus. Normal compressibility, respiratory phasicity and response to augmentation. Popliteal Vein: No evidence of thrombus. Normal compressibility, respiratory phasicity and response to augmentation. Calf Veins: Limited assessment of the calf veins. Posterior tibial vein appears patent. Peroneal vein not visualized. Superficial Great Saphenous Vein: No evidence of thrombus. Normal compressibility. Venous Reflux:  None. Other Findings:  Peripheral calf edema noted. IMPRESSION: No significant occlusive right lower extremity femoropopliteal DVT. Limited assessment of the calf veins. Peripheral subcutaneous edema. Electronically Signed   By: Judie PetitM.  Shick M.D.   On: 06/08/2018 13:07   Dg Foot 2 Views Right  Result Date: 06/09/2018 CLINICAL DATA:  Osteomyelitis of right toe and foot EXAM: RIGHT FOOT - 2 VIEW COMPARISON:  None. FINDINGS: Advanced osteoarthritic changes at the 1st MTP joint. No fracture, subluxation or dislocation. No bony destructive changes seen to suggest or confirm osteomyelitis. Soft tissues are intact. 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 No radiographic changes of osteomyelitis. If clinical concern persists, MRI may be helpful. Electronically Signed   By: Charlett NoseKevin  Dover M.D.   On: 06/09/2018 11:06    Time Spent in minutes  25  Theda Belfast Dade Rodin M.D on  06/10/2018 at 9:35 AM  Between 7am to 7pm - Pager - [phone]  After 7pm go to [email or web address] - password [facility]  [facility] -  Office  [phone]

## 2018-06-10 NOTE — Consult Note (Signed)
   [facility]   06/10/2018  [name] 1955-12-25 [id number]    [facility] due to multiple hospitalizations. Robert [name]   [name]. He reports [facility]. Robert [name] states he is not sure what surgery he will have.   Discussed [facility]engagement. Robert [name] states, "as it stands I am not sure what I will need after I discharge since I am not sure what kind of surgery, if any, will be done. I am still waiting to speak with the surgeon." Robert [name] currently denies having any [facility] needs. However, he is agreeable to contacting [facility] should he desire follow up.   Left contact information and [facility] brochure at bedside.  Robert [name] expressed appreciation of visit.   Raiford NobleAtika Hitomi Slape, MSN-Ed, RN,BSN [facility] Hospital Liaison [phone]

## 2018-06-11 ENCOUNTER — Encounter (HOSPITAL_COMMUNITY)
Admission: EM | Disposition: A | Discharge status: Home / Self Care | Payer: Self-pay | Source: Home / Self Care | Attending: Internal Medicine

## 2018-06-11 ENCOUNTER — Inpatient Hospital Stay (HOSPITAL_COMMUNITY): Payer: Medicare Other | Admitting: Anesthesiology

## 2018-06-11 ENCOUNTER — Ambulatory Visit: Payer: Medicare Other | Admitting: Family Medicine

## 2018-06-11 ENCOUNTER — Ambulatory Visit: Payer: Medicare Other | Admitting: Podiatry

## 2018-06-11 DIAGNOSIS — G4733 Obstructive sleep apnea (adult) (pediatric): Secondary | ICD-10-CM

## 2018-06-11 DIAGNOSIS — M869 Osteomyelitis, unspecified: Secondary | ICD-10-CM

## 2018-06-11 DIAGNOSIS — Z9989 Dependence on other enabling machines and devices: Secondary | ICD-10-CM

## 2018-06-11 DIAGNOSIS — L03119 Cellulitis of unspecified part of limb: Secondary | ICD-10-CM

## 2018-06-11 HISTORY — PX: AMPUTATION: SHX166

## 2018-06-11 LAB — CBC WITH DIFFERENTIAL/PLATELET
ABS IMMATURE GRANULOCYTES: 0.02 10*3/uL (ref 0.00–0.07)
BASOS PCT: 0 %
Basophils Absolute: 0 10*3/uL (ref 0.0–0.1)
EOS PCT: 6 %
Eosinophils Absolute: 0.2 10*3/uL (ref 0.0–0.5)
HEMATOCRIT: 34.7 % — AB (ref 39.0–52.0)
Hemoglobin: 11.5 g/dL — ABNORMAL LOW (ref 13.0–17.0)
IMMATURE GRANULOCYTES: 1 %
LYMPHS PCT: 24 %
Lymphs Abs: 1 10*3/uL (ref 0.7–4.0)
MCH: 29.5 pg (ref 26.0–34.0)
MCHC: 33.1 g/dL (ref 30.0–36.0)
MCV: 89 fL (ref 80.0–100.0)
Monocytes Absolute: 0.5 10*3/uL (ref 0.1–1.0)
Monocytes Relative: 12 %
NEUTROS ABS: 2.4 10*3/uL (ref 1.7–7.7)
Neutrophils Relative %: 57 %
Platelets: 197 10*3/uL (ref 150–400)
RBC: 3.9 MIL/uL — AB (ref 4.22–5.81)
RDW: 12.8 % (ref 11.5–15.5)
WBC: 4 10*3/uL (ref 4.0–10.5)
nRBC: 0 % (ref 0.0–0.2)

## 2018-06-11 LAB — BASIC METABOLIC PANEL
Anion gap: 7 (ref 5–15)
BUN: 10 mg/dL (ref 8–23)
CO2: 26 mmol/L (ref 22–32)
CREATININE: 0.96 mg/dL (ref 0.61–1.24)
Calcium: 9.1 mg/dL (ref 8.9–10.3)
Chloride: 104 mmol/L (ref 98–111)
GFR calc Af Amer: >60 mL/min (ref >60)
GLUCOSE: 138 mg/dL — AB (ref 70–99)
Potassium: 4.2 mmol/L (ref 3.5–5.1)
SODIUM: 137 mmol/L (ref 135–145)

## 2018-06-11 LAB — GLUCOSE, CAPILLARY
GLUCOSE-CAPILLARY: 113 mg/dL — AB (ref 70–99)
GLUCOSE-CAPILLARY: 209 mg/dL — AB (ref 70–99)
Glucose-Capillary: 114 mg/dL — ABNORMAL HIGH (ref 70–99)
Glucose-Capillary: 100 mg/dL — ABNORMAL HIGH (ref 70–99)
Glucose-Capillary: 95 mg/dL (ref 70–99)

## 2018-06-11 SURGERY — AMPUTATION, FOOT, RAY
Anesthesia: General | Laterality: Right

## 2018-06-11 MED ORDER — LIDOCAINE HCL (CARDIAC) PF 100 MG/5ML IV SOSY
PREFILLED_SYRINGE | INTRAVENOUS | Status: DC | PRN
  Administered 2018-06-11: 60 mg via INTRATRACHEAL

## 2018-06-11 MED ORDER — PROPOFOL 10 MG/ML IV BOLUS
INTRAVENOUS | Status: AC
  Filled 2018-06-11: qty 20

## 2018-06-11 MED ORDER — ACETAMINOPHEN 325 MG PO TABS: 325.0000 mg | ORAL_TABLET | Freq: Four times a day (QID) | ORAL | Status: DC | PRN

## 2018-06-11 MED ORDER — ONDANSETRON HCL 4 MG/2ML IJ SOLN
INTRAMUSCULAR | Status: DC | PRN
  Administered 2018-06-11: 4 mg via INTRAVENOUS

## 2018-06-11 MED ORDER — PHENYLEPHRINE HCL 10 MG/ML IJ SOLN
INTRAMUSCULAR | Status: DC | PRN
  Administered 2018-06-11: 40 ug via INTRAVENOUS

## 2018-06-11 MED ORDER — MORPHINE SULFATE (PF) 2 MG/ML IV SOLN: 0.5000 mg | INTRAVENOUS | Status: DC | PRN

## 2018-06-11 MED ORDER — HYDROCODONE-ACETAMINOPHEN 5-325 MG PO TABS
1.0000 | ORAL_TABLET | ORAL | Status: DC | PRN
  Administered 2018-06-11 – 2018-06-12 (×3): 2 via ORAL
  Filled 2018-06-11 (×2): qty 2

## 2018-06-11 MED ORDER — OXYCODONE HCL 5 MG PO TABS: 5.0000 mg | ORAL_TABLET | Freq: Once | ORAL | Status: DC | PRN

## 2018-06-11 MED ORDER — POLYETHYLENE GLYCOL 3350 17 G PO PACK: 17.0000 g | PACK | Freq: Every day | ORAL | Status: DC | PRN

## 2018-06-11 MED ORDER — BISACODYL 10 MG RE SUPP: 10.0000 mg | Freq: Every day | RECTAL | Status: DC | PRN

## 2018-06-11 MED ORDER — DOCUSATE SODIUM 100 MG PO CAPS
100.0000 mg | ORAL_CAPSULE | Freq: Two times a day (BID) | ORAL | Status: DC
  Administered 2018-06-11 – 2018-06-12 (×3): 100 mg via ORAL
  Filled 2018-06-11 (×3): qty 1

## 2018-06-11 MED ORDER — FENTANYL CITRATE (PF) 100 MCG/2ML IJ SOLN: 25.0000 ug | INTRAMUSCULAR | Status: DC | PRN

## 2018-06-11 MED ORDER — ONDANSETRON HCL 4 MG/2ML IJ SOLN: 4.0000 mg | Freq: Four times a day (QID) | INTRAMUSCULAR | Status: DC | PRN

## 2018-06-11 MED ORDER — FENTANYL CITRATE (PF) 250 MCG/5ML IJ SOLN
INTRAMUSCULAR | Status: AC
  Filled 2018-06-11: qty 5

## 2018-06-11 MED ORDER — 0.9 % SODIUM CHLORIDE (POUR BTL) OPTIME
TOPICAL | Status: DC | PRN
  Administered 2018-06-11: 1000 mL

## 2018-06-11 MED ORDER — METOCLOPRAMIDE HCL 5 MG/ML IJ SOLN: 5.0000 mg | Freq: Three times a day (TID) | INTRAMUSCULAR | Status: DC | PRN

## 2018-06-11 MED ORDER — ONDANSETRON HCL 4 MG/2ML IJ SOLN
INTRAMUSCULAR | Status: AC
  Filled 2018-06-11: qty 2

## 2018-06-11 MED ORDER — MIDAZOLAM HCL 2 MG/2ML IJ SOLN
INTRAMUSCULAR | Status: DC | PRN
  Administered 2018-06-11: 2 mg via INTRAVENOUS

## 2018-06-11 MED ORDER — ONDANSETRON HCL 4 MG/2ML IJ SOLN: 4.0000 mg | Freq: Once | INTRAMUSCULAR | Status: DC | PRN

## 2018-06-11 MED ORDER — HYDROCODONE-ACETAMINOPHEN 7.5-325 MG PO TABS: 1.0000 | ORAL_TABLET | ORAL | Status: DC | PRN

## 2018-06-11 MED ORDER — METHOCARBAMOL 1000 MG/10ML IJ SOLN
500.0000 mg | Freq: Four times a day (QID) | INTRAVENOUS | Status: DC | PRN
  Filled 2018-06-11: qty 5

## 2018-06-11 MED ORDER — MAGNESIUM CITRATE PO SOLN: 1.0000 | Freq: Once | ORAL | Status: DC | PRN

## 2018-06-11 MED ORDER — PROPOFOL 10 MG/ML IV BOLUS
INTRAVENOUS | Status: DC | PRN
  Administered 2018-06-11: 200 mg via INTRAVENOUS

## 2018-06-11 MED ORDER — MIDAZOLAM HCL 2 MG/2ML IJ SOLN
INTRAMUSCULAR | Status: AC
  Filled 2018-06-11: qty 2

## 2018-06-11 MED ORDER — BUPIVACAINE HCL (PF) 0.25 % IJ SOLN
INTRAMUSCULAR | Status: AC
  Filled 2018-06-11: qty 30

## 2018-06-11 MED ORDER — ONDANSETRON HCL 4 MG PO TABS: 4.0000 mg | ORAL_TABLET | Freq: Four times a day (QID) | ORAL | Status: DC | PRN

## 2018-06-11 MED ORDER — SODIUM CHLORIDE 0.9 % IV SOLN
INTRAVENOUS | Status: DC
  Administered 2018-06-11: 17:00:00 via INTRAVENOUS

## 2018-06-11 MED ORDER — FENTANYL CITRATE (PF) 250 MCG/5ML IJ SOLN
INTRAMUSCULAR | Status: DC | PRN
  Administered 2018-06-11 (×2): 50 ug via INTRAVENOUS
  Administered 2018-06-11: 25 ug via INTRAVENOUS

## 2018-06-11 MED ORDER — OXYCODONE HCL 5 MG/5ML PO SOLN: 5.0000 mg | Freq: Once | ORAL | Status: DC | PRN

## 2018-06-11 MED ORDER — DEXAMETHASONE SODIUM PHOSPHATE 10 MG/ML IJ SOLN
INTRAMUSCULAR | Status: DC | PRN
  Administered 2018-06-11: 10 mg via INTRAVENOUS

## 2018-06-11 MED ORDER — METOCLOPRAMIDE HCL 5 MG PO TABS: 5.0000 mg | ORAL_TABLET | Freq: Three times a day (TID) | ORAL | Status: DC | PRN

## 2018-06-11 MED ORDER — LIDOCAINE HCL 1 % IJ SOLN
INTRAMUSCULAR | Status: AC
  Filled 2018-06-11: qty 20

## 2018-06-11 MED ORDER — METHOCARBAMOL 500 MG PO TABS
500.0000 mg | ORAL_TABLET | Freq: Four times a day (QID) | ORAL | Status: DC | PRN
  Administered 2018-06-11: 500 mg via ORAL
  Filled 2018-06-11: qty 1

## 2018-06-11 MED ORDER — LACTATED RINGERS IV SOLN
INTRAVENOUS | Status: DC
  Administered 2018-06-11 (×2): via INTRAVENOUS

## 2018-06-11 MED ORDER — DEXAMETHASONE SODIUM PHOSPHATE 10 MG/ML IJ SOLN
INTRAMUSCULAR | Status: AC
  Filled 2018-06-11: qty 1

## 2018-06-11 MED ORDER — LIDOCAINE HCL 1 % IJ SOLN
INTRAMUSCULAR | Status: DC | PRN
  Administered 2018-06-11: 10 mL

## 2018-06-11 SURGICAL SUPPLY — 32 items
BLADE SAW SGTL MED 73X18.5 STR (BLADE) IMPLANT
BLADE SURG 21 STRL SS (BLADE) ×3 IMPLANT
BNDG COHESIVE 4X5 TAN STRL (GAUZE/BANDAGES/DRESSINGS) ×3 IMPLANT
BNDG COHESIVE 6X5 TAN STRL LF (GAUZE/BANDAGES/DRESSINGS) ×2 IMPLANT
BNDG GAUZE ELAST 4 BULKY (GAUZE/BANDAGES/DRESSINGS) ×3 IMPLANT
COVER SURGICAL LIGHT HANDLE (MISCELLANEOUS) ×6 IMPLANT
COVER WAND RF STERILE (DRAPES) ×3 IMPLANT
DRAPE U-SHAPE 47X51 STRL (DRAPES) ×6 IMPLANT
DRSG ADAPTIC 3X8 NADH LF (GAUZE/BANDAGES/DRESSINGS) ×3 IMPLANT
DRSG PAD ABDOMINAL 8X10 ST (GAUZE/BANDAGES/DRESSINGS) ×6 IMPLANT
DURAPREP 26ML APPLICATOR (WOUND CARE) ×3 IMPLANT
ELECT REM PT RETURN 9FT ADLT (ELECTROSURGICAL) ×3
ELECTRODE REM PT RTRN 9FT ADLT (ELECTROSURGICAL) ×1 IMPLANT
GAUZE SPONGE 4X4 12PLY STRL (GAUZE/BANDAGES/DRESSINGS) ×3 IMPLANT
GLOVE BIOGEL PI IND STRL 9 (GLOVE) ×1 IMPLANT
GLOVE BIOGEL PI INDICATOR 9 (GLOVE) ×2
GLOVE SURG ORTHO 9.0 STRL STRW (GLOVE) ×3 IMPLANT
GOWN STRL REUS W/ TWL XL LVL3 (GOWN DISPOSABLE) ×2 IMPLANT
GOWN STRL REUS W/TWL XL LVL3 (GOWN DISPOSABLE) ×6
KIT BASIN OR (CUSTOM PROCEDURE TRAY) ×3 IMPLANT
KIT TURNOVER KIT B (KITS) ×3 IMPLANT
NS IRRIG 1000ML POUR BTL (IV SOLUTION) ×3 IMPLANT
PACK ORTHO EXTREMITY (CUSTOM PROCEDURE TRAY) ×3 IMPLANT
PAD ARMBOARD 7.5X6 YLW CONV (MISCELLANEOUS) ×6 IMPLANT
STOCKINETTE IMPERVIOUS LG (DRAPES) IMPLANT
SUT ETHILON 2 0 PSLX (SUTURE) ×3 IMPLANT
SYRINGE CONTROL L 12CC (SYRINGE) ×3 IMPLANT
SYRINGE CONTROL LL 12CC (SYRINGE) IMPLANT
TOWEL OR 17X26 10 PK STRL BLUE (TOWEL DISPOSABLE) ×3 IMPLANT
TUBE CONNECTING 12'X1/4 (SUCTIONS) ×1
TUBE CONNECTING 12X1/4 (SUCTIONS) ×2 IMPLANT
YANKAUER SUCT BULB TIP NO VENT (SUCTIONS) ×3 IMPLANT

## 2018-06-11 NOTE — Consult Note (Signed)
ORTHOPAEDIC CONSULTATION  REQUESTING PHYSICIAN: Eugenie Filler, MD  Chief Complaint: Chronic osteomyelitis right second toe.  HPI: Robert [name] is a 63 y.o. male who presents with chronic osteomyelitis right second toe with venous insufficiency and cellulitis of the right leg.  Patient is type II diabetic sleep apnea uses a CPAP machine.  Past Medical History:  Diagnosis Date  . Allergic rhinitis due to pollen   . Arthritis   . Asthma   . Back pain   . Barrett esophagus   . Depressive disorder   . Diabetes mellitus type 2 in obese ([facility])   . Exposure to hepatitis B   . Exposure to hepatitis C   . Narcotic abuse ([facility])    pain medications  . OSA (obstructive sleep apnea)    on CPAP    Past Surgical History:  Procedure Laterality Date  . ABDOMINAL SURGERY    . BACK SURGERY     Rods and screws in lumbar area  . COLONOSCOPY    . ESOPHAGOGASTRODUODENOSCOPY  multiple  . HERNIA REPAIR     4  . JOINT REPLACEMENT    . TOTAL KNEE ARTHROPLASTY  2009   left   Social History   Socioeconomic History  . Marital status: Married    Spouse name: Not on file  . Number of children: 2  . Years of education: Not on file  . Highest education level: Not on file  Occupational History  . Occupation: Therapist, nutritional, Disabled  Social Needs  . Financial resource strain: Not on file  . Food insecurity:    Worry: Not on file    Inability: Not on file  . Transportation needs:    Medical: Not on file    Non-medical: Not on file  Tobacco Use  . Smoking status: Former Smoker    Packs/day: 0.80    Years: 15.00    Pack years: 12.00    Types: Cigarettes    Last attempt to quit: 07/16/2004    Years since quitting: 13.9  . Smokeless tobacco: Never Used  Substance and Sexual Activity  . Alcohol use: No  . Drug use: No    Comment: Prior abuse of narcotics  . Sexual activity: Never  Lifestyle  . Physical activity:    Days per week: Not on file    Minutes per session: Not on file  .  Stress: Not on file  Relationships  . Social connections:    Talks on phone: Not on file    Gets together: Not on file    Attends religious service: Not on file    Active member of club or organization: Not on file    Attends meetings of clubs or organizations: Not on file    Relationship status: Not on file  Other Topics Concern  . Not on file  Social History Narrative   Married, 2 daughters born 2003 and 2004. 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 Rew history of living in working in [place].   6 caffeinated beverages daily.   01/21/2017   Family History  Problem Relation Age of Onset  . Allergies Mother   . Early death Father   . Thyroid disease Sister   . Colon cancer Neg Hx   . Stomach cancer Neg Hx    - negative except otherwise stated in the family history section No Known Allergies Prior to Admission medications   Medication Sig Start Date End Date Taking? Authorizing Provider  albuterol (PROVENTIL HFA;VENTOLIN HFA) 108 (90 Base) MCG/ACT inhaler Inhale 2 puffs into the lungs every 6 (six) hours as needed for wheezing or shortness of breath.   Yes [provider]  APPLE CIDER VINEGAR PO Take 15 mLs by mouth 2 (two) times daily.    Yes [provider]  atorvastatin (LIPITOR) 20 MG tablet TAKE 1 TABLET BY MOUTH EVERYDAY AT BEDTIME Patient taking differently: Take 20 mg by mouth daily at 6 PM.  12/23/17  Yes Opalski, Neoma Laming, DO  B Complex Vitamins (B COMPLEX 100 PO) Take 1 capsule by mouth every other day.    Yes [provider]  blood glucose meter kit and supplies KIT Dispense based on patient and insurance preference. Use up to four times daily as directed. (FOR ICD-9 250.00, 250.01). 05/09/17  Yes Opalski, Deborah, DO  blood glucose meter kit and supplies Dispense based on patient and insurance preference. Use to check glucose fasting in the AM and then after largest meal of the day.. (FOR ICD-10 E10.9, E11.9). 12/04/17   Yes Opalski, Neoma Laming, DO  Blood Glucose Monitoring Suppl (ONE TOUCH ULTRA MINI) w/Device KIT Use to test blood sugar, test in the morning fasting and test after largest meal of the day. 05/28/17  Yes Opalski, Neoma Laming, DO  butalbital-acetaminophen-caffeine (FIORICET, ESGIC) [phone] MG tablet Take 1 tablet by mouth every 6 (six) hours as needed for headache. 03/17/18  Yes Lavina Hamman, MD  CALCIUM-MAGNESIUM-ZINC PO Take 1 tablet by mouth every other day.   Yes [provider]  Cholecalciferol (VITAMIN D3) 5000 units TABS 5,000 IU OTC vitamin D3 daily. Patient taking differently: Take 5,000 Units by mouth daily.  05/08/16  Yes Opalski, Neoma Laming, DO  cyclobenzaprine (FLEXERIL) 10 MG tablet TAKE 1 TABLET BY MOUTH TWICE A DAY Patient taking differently: Take 10 mg by mouth 2 (two) times daily.  04/28/18  Yes Opalski, Deborah, DO  DANDELION PO Take 1 tablet by mouth every other day.   Yes [provider]  glucose blood (ONE TOUCH ULTRA TEST) test strip Use to test blood sugar, test in the morning fasting and test after largest meal of the day. 05/28/17  Yes Opalski, Neoma Laming, DO  meloxicam (MOBIC) 15 MG tablet Take 1 tablet (15 mg total) by mouth daily. Patient taking differently: Take 15 mg by mouth daily as needed for pain.  01/09/18  Yes Opalski, Neoma Laming, DO  metFORMIN (GLUCOPHAGE) 500 MG tablet TAKE 2 TABLETS (1,000 MG TOTAL) BY MOUTH 2 (TWO) TIMES DAILY WITH A MEAL. 05/09/18  Yes Opalski, Neoma Laming, DO  mupirocin ointment (BACTROBAN) 2 % Apply 1 application topically daily. 04/10/18  Yes [provider]  naphazoline-pheniramine (NAPHCON-A) 0.025-0.3 % ophthalmic solution Place 1 drop into both eyes 4 (four) times daily as needed for eye irritation.   Yes [provider]  Olmesartan-amLODIPine-HCTZ 20-5-12.5 MG TABS Take 1 tablet by mouth at bedtime.    Yes [provider]  Jonetta Speak LANCETS 46T MISC Use for testing blood sugar, test once in the morning  fasting and test after largest meal of the day. 05/28/17  Yes Opalski, Neoma Laming, DO  Potassium 99 MG TABS Take 99 mg by mouth every other day.    Yes [provider]  traMADol (ULTRAM) 50 MG tablet Take 100 mg by mouth 4 (four) times daily. For pain   Yes [provider]  TURMERIC CURCUMIN PO Take 1 tablet by mouth every other day.   Yes [provider]  vitamin C (ASCORBIC ACID) 500  MG tablet Take 500 mg by mouth daily.   Yes [provider]  Vitamin D, Ergocalciferol, (DRISDOL) 50000 units CAPS capsule TAKE 1 CAPSULE WEEKLY Patient taking differently: Take 50,000 Units by mouth every Friday.  08/05/17  Yes Opalski, Deborah, DO  VITAMIN E PO Take 1 tablet by mouth daily.   Yes [provider]  vitamin B-12 1000 MCG tablet Take 1 tablet (1,000 mcg total) by mouth daily. Patient not taking: Reported on 06/08/2018 04/10/18   Kerney Elbe, DO   Mr Foot Right Wo Contrast  Result Date: 06/10/2018 CLINICAL DATA:  Second toe osteomyelitis. EXAM: MRI OF THE RIGHT FOREFOOT WITHOUT CONTRAST TECHNIQUE: Multiplanar, multisequence MR imaging of the right forefoot was performed. No intravenous contrast was administered. COMPARISON:  Right foot x-rays from same day. MRI right foot dated April 09, 2018. FINDINGS: Bones/Joint/Cartilage Unchanged marrow edema with corresponding T1 marrow hypointensity involving the tip of the second distal phalanx. No fracture or dislocation. Normal alignment. No joint effusion. 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 Unchanged mild third tarsometatarsal joint osteoarthritis. Ligaments Collateral ligaments are intact.  Lisfranc ligament is intact. Muscles and Tendons Flexor, peroneal and extensor compartment tendons are intact. Diffuse fatty atrophy of the intrinsic muscles of the forefoot. Soft tissue Small ulceration at the tip of the second toe. Improved soft tissue swelling of the second toe and dorsal  forefoot. No fluid collection or hematoma. No soft tissue mass. IMPRESSION: 1. Small soft tissue ulceration at the tip of the second toe with persistent osteomyelitis of the tip of the second distal phalanx. 2. Improved soft tissue swelling of the dorsal forefoot and second toe which could reflect resolving cellulitis or third spacing. No abscess. Electronically Signed   By: Titus Dubin M.D.   On: 06/10/2018 08:07   Mr Tibia Fibula Right W Wo Contrast  Result Date: 06/10/2018 CLINICAL DATA:  Recurrent right lower leg cellulitis. EXAM: MRI OF LOWER RIGHT EXTREMITY WITHOUT AND WITH CONTRAST TECHNIQUE: Multiplanar, multisequence MR imaging of the right lower leg was performed both before and after administration of intravenous contrast. CONTRAST:  10 mL Gadavist intravenous contrast. COMPARISON:  Right tibia and fibula x-rays dated June 08, 2018. FINDINGS: Bones/Joint/Cartilage No marrow signal abnormality. No fracture or dislocation. Normal alignment. No joint effusion. Mild medial compartment osteoarthritis of the knee. Muscles and Tendons No muscle edema or atrophy. Soft tissue Extensive circumferential soft tissue swelling and enhancement of the mid to distal lower leg. No fluid collection or hematoma. No soft tissue mass. IMPRESSION: 1. Extensive circumferential soft tissue swelling and enhancement of the mid to distal lower leg, consistent with cellulitis. 2. No abscess or myositis. Electronically Signed   By: Titus Dubin M.D.   On: 06/10/2018 08:16   Dg Foot 2 Views Right  Result Date: 06/09/2018 CLINICAL DATA:  Osteomyelitis of right toe and foot EXAM: RIGHT FOOT - 2 VIEW COMPARISON:  None. FINDINGS: Advanced osteoarthritic changes at the 1st MTP joint. No fracture, subluxation or dislocation. No bony destructive changes seen to suggest or confirm osteomyelitis. Soft tissues are intact. 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 No radiographic changes of  osteomyelitis. If clinical concern persists, MRI may be helpful. Electronically Signed   By: Rolm Baptise M.D.   On: 06/09/2018 11:06   - pertinent xrays, CT, MRI studies were reviewed and independently interpreted  Positive ROS: All other systems have been reviewed and were otherwise negative with the exception of those mentioned in the HPI and  as above.  Physical Exam: General: Alert, no acute distress Psychiatric: Patient is competent for consent with normal mood and affect Lymphatic: No axillary or cervical lymphadenopathy Cardiovascular: No pedal edema Respiratory: No cyanosis, no use of accessory musculature GI: No organomegaly, abdomen is soft and non-tender    Images:  @ENCIMAGES @  Labs:  Lab Results  Component Value Date   HGBA1C 5.9 (H) 06/06/2018   HGBA1C 5.8 (H) 03/17/2018   HGBA1C 6.1 (A) 01/09/2018   ESRSEDRATE 60 (H) 04/10/2018   ESRSEDRATE 45 (H) 04/08/2018   ESRSEDRATE 15 03/15/2018   CRP 6.0 (H) 04/10/2018   CRP 14.8 (H) 03/16/2018   REPTSTATUS 04/12/2018 FINAL 04/07/2018   CULT  04/07/2018    NO GROWTH 5 DAYS Performed at [facility] [place] [address] [place], [place] [address]     Lab Results  Component Value Date   ALBUMIN 4.2 06/08/2018   ALBUMIN 4.4 04/28/2018   ALBUMIN 3.3 (L) 04/10/2018    Neurologic: Patient does not have protective sensation bilateral lower extremities.   MUSCULOSKELETAL:   Skin: Examination patient has swelling and ulceration of the right second toe.  Radiographs show destructive changes consistent with osteomyelitis.  Patient's hemoglobin A1c has improved to 5.9 he has had a history of mild protein caloric malnutrition.  Patient has a palpable dorsalis pedis pulse.  He has venous stasis swelling with cellulitis of the calf.  MRI scan shows no abscess of the calf does show the chronic osteomyelitis of the second toe.  Assessment: Assessment: Cellulitis right leg with diabetic insensate neuropathy with chronic  osteomyelitis of the right second toe.  Plan: Plan: We will plan for a right second toe amputation.  Risk and benefits were discussed the importance of nonweightbearing postoperatively.  Plan for surgery today.  Thank you for the consult and the opportunity to see [name]   , Bee [phone] 7:27 AM

## 2018-06-11 NOTE — Plan of Care (Signed)

## 2018-06-11 NOTE — Care Management Important Message (Signed)
Important Message  Patient Details  Name: Robert [name] MRN: [id number] Date of Birth: Mar 22, 1956   Medicare Important Message Given:  Yes    Dorena BodoIris Devika Dragovich 06/11/2018, 2:24 PM

## 2018-06-11 NOTE — Op Note (Signed)
06/11/2018  2:56 PM  PATIENT:  Robert [name]    PRE-OPERATIVE DIAGNOSIS:  Right 2nd Toe Osteomyelitis  POST-OPERATIVE DIAGNOSIS:  Same  PROCEDURE:  RIGHT 2ND toe AMPUTATION  SURGEON:  Nadara MustardMarcus V Vallen Calabrese, MD  PHYSICIAN ASSISTANT:None ANESTHESIA:   General  PREOPERATIVE INDICATIONS:  Robert [name] is a  62 y.o. male with a diagnosis of Right 2nd Toe Osteomyelitis who failed conservative measures and elected for surgical management.    The risks benefits and alternatives were discussed with the patient preoperatively including but not limited to the risks of infection, bleeding, nerve injury, cardiopulmonary complications, the need for revision surgery, among others, and the patient was willing to proceed.  OPERATIVE IMPLANTS: None.  @ENCIMAGES @  OPERATIVE FINDINGS: No abscess at the level of amputation  OPERATIVE PROCEDURE:.  Patient brought the operating room and underwent a general anesthetic.  After adequate levels anesthesia were obtained patient's right lower extremity was prepped using DuraPrep draped into a sterile field a timeout was called.  An incision was made just distal to the MTP joint right second toe.  The toe was amputated through the MTP joint.  Electrocautery was used for hemostasis the wound was irrigated with normal saline.  The incision was closed using 2-0 nylon.  A sterile dressing was applied.  Patient did have chronic venous insufficiency in the right lower extremity and patient underwent compression wrap from the tibial tubercle to the metatarsal heads to help decrease the venous swelling.  Patient was extubated taken to PACU in stable condition.   DISCHARGE PLANNING:  Antibiotic duration: Continue IV antibiotics for 24 hours postoperatively  Weightbearing: Touchdown weightbearing on the right  Pain medication: Opioid pathway ordered  Dressing care/ Wound VAC: Leave dressing dry and intact until follow-up in the office in 1 week  Ambulatory devices:  Walker  Discharge to: Anticipate discharge to home.  Follow-up: In the office 1 week post operative.

## 2018-06-11 NOTE — Progress Notes (Signed)
Orthopedic Tech Progress Note Patient Details:  Robert [name] 25-Aug-1955 [id number]  Ortho Devices Type of Ortho Device: Postop shoe/boot Ortho Device/Splint Location: rle Ortho Device/Splint Interventions: Ordered, Application, Adjustment   Post Interventions Patient Tolerated: Well Instructions Provided: Care of device, Adjustment of device   Robert [name], Robert [name] 06/11/2018, 5:54 PM

## 2018-06-11 NOTE — Progress Notes (Signed)
PROGRESS NOTE    Robert [name]  ZHY:[id number] DOB: [birth year] DOA: 06/08/2018 PCP: Thomasene Lot, DO    Brief Narrative:  62 year old morbidly obese male with history of OSA on CPAP, asthma, prediabetic, chronic pain with history of narcotic abuse and recurrent right lower extremity cellulitis, who was treated initially with doxycycline and then hospitalized for recurrent cellulitis.  MRI of the foot showed early changes of osteomyelitis of the right second toe.  ID was consulted who recommended treating with 6 weeks of oral Augmentin.  Patient reports that he was completing his antibiotic course on the day of admission and has been adherent to it including monitoring his legs, wearing full-length trousers and stockings.  His cellulitis had completely resolved but for the past 2 days he noticed increasing pain with swelling and erythema of the right lower leg.  Also reports subjective fevers and chills.  He reported that the chronic wound on the tip of his right second toe has significantly improved, without any drainage or pain. In the ED his vitals were stable.  X-ray of the right tibia and fibula show soft tissue swelling.  Doppler of the lower extremity was negative for DVT. Lab work showed mild hyponatremia and mildly elevated lactic acid.  Received 2 L normal saline bolus.  He was started on empiric IV clindamycin and admitted to hospitalist service for recurrent cellulitis with failed outpatient treatment.   Assessment & Plan:   Principal Problem:   Recurrent cellulitis of lower extremity Active Problems:   Chronic back pain   h/o Narcotic abuse   OSA on CPAP- seen Dr Sherene Sires in past   Chronic venous stasis dermatitis of both lower extremities   Type 2 diabetes mellitus with complication, without long-term current use of insulin (HCC)   Essential hypertension   Osteomyelitis of toe of right foot (HCC)   Osteomyelitis of second toe of right foot (HCC)  Recurrent cellulitis of lower  extremity Right second toe osteomyelitis. Patient completed 6 weeks course of oral Augmentin just prior to being admitted and at least 2 weeks of antibiotics prior to that. On empiric IV clindamycin.  Cellulitis improving with current regimen of IV antibiotics.  Lower extremity Dopplers which were done were negative for DVT.  MRI of the right tibia/fibula and right foot obtained after discussing with ID.  Shows changes of cellulitis on the tibia and fibula without myositis or osteomyelitis however shows persistent distal second toe osteomyelitis, without any abscess or fluid collection.  Discussed options of surgery with patient given persistent cellulitis (he is even followed by podiatrist as outpatient and has had debridement of the ulcer done recently).  Patient agrees on being evaluated by orthopedics.    Patient was seen in consultation by Dr. Lajoyce Corners who recommended right second toe amputation.  Patient to the OR today for surgery.  Continue current IV antibiotics.  Per orthopedics.   Active Problems:   Chronic back pain/ h/o Narcotic abuse Continue current regimen of Flexeril and tramadol.  Essential hypertension Continue Norvasc.    Hyperlipidemia Stable.  Continue statin.    OSA on CPAP-  Continue nighttime CPAP    Type 2 diabetes mellitus, controlled, without long-term current use of insulin (HCC) Diabetes well controlled.  Hemoglobin A1c 5.9 on 06/06/2018.  Sliding scale insulin.   Morbid obesity Needs counseling on diet and exercise  HCV antibody positive Undetectable viral load.  Normal LFTs.  Outpatient follow-up with PCP.   DVT prophylaxis: Lovenox Code Status: Full Family Communication: Updated patient.  No family at bedside. Disposition Plan: Likely home when okay by orthopedics with clinical improvement.   Consultants:   Orthopedics: Dr. Lajoyce Corners 06/11/2018  Infectious disease: Dr.Van Dam 06/09/2018  Procedures:   Plain films of the right tib-fib  06/08/2018  Plain films of the right foot 06/09/2018  MRI right tib-fib 06/09/2018  MRI right foot 06/09/2018  Right lower extremity Dopplers 06/08/2018  Right second toe amputation pending per Dr. Lajoyce Corners 06/11/2018  Antimicrobials:   IV clindamycin 06/08/2018     Subjective: Sitting up in chair.  Denies any chest pain no shortness of breath.  Feels erythema on lower extremity improving.  Awaiting surgery.   Objective: Vitals:   06/11/18 1500 06/11/18 1510 06/11/18 1527 06/11/18 1705  BP:  121/64 115/69 124/68  Pulse: 66 66 64 65  Resp: 18 20 19 20   Temp:  98.4 F (36.9 C) 98.4 F (36.9 C) 98.4 F (36.9 C)  TempSrc:   Oral   SpO2: 97% 97% 96% 96%  Weight:      Height:        Intake/Output Summary (Last 24 hours) at 06/11/2018 1920 Last data filed at 06/11/2018 1826 Gross per 24 hour  Intake 790 ml  Output 1530 ml  Net -740 ml   Filed Weights   06/08/18 1054 06/09/18 1504  Weight: (!) 156.5 kg (!) 156.6 kg    Examination:  General exam: Appears calm and comfortable  Respiratory system: Clear to auscultation. Respiratory effort normal. Cardiovascular system: S1 & S2 heard, RRR. No JVD, murmurs, rubs, gallops or clicks. No pedal edema. Gastrointestinal system: Abdomen is nondistended, soft and nontender. No organomegaly or masses felt. Normal bowel sounds heard. Central nervous system: Alert and oriented. No focal neurological deficits. Extremities: Symmetric 5 x 5 power. Skin: Decreased erythema and warmth noted on anterior tibia and less tenderness.  Clean ulceration noted over the tip of the right second toe. Psychiatry: Judgement and insight appear normal. Mood & affect appropriate.     Data Reviewed: I have personally reviewed following labs and imaging studies  CBC: Recent Labs  Lab 06/08/18 1108 06/09/18 0436 06/11/18 0902  WBC 8.2 5.2 4.0  NEUTROABS 6.7 3.2 2.4  HGB 12.7* 11.0* 11.5*  HCT 37.0* 32.8* 34.7*  MCV 89.4 91.1 89.0  PLT 154  136* 197   Basic Metabolic Panel: Recent Labs  Lab 06/08/18 1108 06/09/18 0436 06/11/18 0902  NA 134* 138 137  K 3.7 3.9 4.2  CL 102 105 104  CO2 23 26 26   GLUCOSE 152* 123* 138*  BUN 20 18 10   CREATININE 1.02 0.86 0.96  CALCIUM 9.1 8.9 9.1   GFR: Estimated Creatinine Clearance: 123.1 mL/min (by C-G formula based on SCr of 0.96 mg/dL). Liver Function Tests: Recent Labs  Lab 06/08/18 1108  AST 31  ALT 43  ALKPHOS 55  BILITOT 1.1  PROT 7.7  ALBUMIN 4.2   No results for input(s): LIPASE, AMYLASE in the last 168 hours. No results for input(s): AMMONIA in the last 168 hours. Coagulation Profile: No results for input(s): INR, PROTIME in the last 168 hours. Cardiac Enzymes: No results for input(s): CKTOTAL, CKMB, CKMBINDEX, TROPONINI in the last 168 hours. BNP (last 3 results) No results for input(s): PROBNP in the last 8760 hours. HbA1C: No results for input(s): HGBA1C in the last 72 hours. CBG: Recent Labs  Lab 06/10/18 2130 06/11/18 0636 06/11/18 1139 06/11/18 1455 06/11/18 1706  GLUCAP 146* 114* 100* 95 113*   Lipid Profile: No results for input(s):  CHOL, HDL, LDLCALC, TRIG, CHOLHDL, LDLDIRECT in the last 72 hours. Thyroid Function Tests: No results for input(s): TSH, T4TOTAL, FREET4, T3FREE, THYROIDAB in the last 72 hours. Anemia Panel: No results for input(s): VITAMINB12, FOLATE, FERRITIN, TIBC, IRON, RETICCTPCT in the last 72 hours. Sepsis Labs: Recent Labs  Lab 06/08/18 1118 06/08/18 1401  LATICACIDVEN 2.00* 1.27    Recent Results (from the past 240 hour(s))  MRSA PCR Screening     Status: None   Collection Time: 06/10/18  4:22 PM  Result Value Ref Range Status   MRSA by PCR NEGATIVE NEGATIVE Final    Comment:        The GeneXpert MRSA Assay (FDA approved for NASAL specimens only), is one component of a comprehensive MRSA colonization surveillance program. It is not intended to diagnose MRSA infection nor to guide or monitor treatment  for MRSA infections. Performed at [facility], [address] [place], [place] [address]          Radiology Studies: Mr Foot Right Wo Contrast  Result Date: 06/10/2018 CLINICAL DATA:  Second toe osteomyelitis. EXAM: MRI OF THE RIGHT FOREFOOT WITHOUT CONTRAST TECHNIQUE: Multiplanar, multisequence MR imaging of the right forefoot was performed. No intravenous contrast was administered. COMPARISON:  Right foot x-rays from same day. MRI right foot dated April 09, 2018. FINDINGS: Bones/Joint/Cartilage Unchanged marrow edema with corresponding T1 marrow hypointensity involving the tip of the second distal phalanx. No fracture or dislocation. Normal alignment. No joint effusion. 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 Unchanged mild third tarsometatarsal joint osteoarthritis. Ligaments Collateral ligaments are intact.  Lisfranc ligament is intact. Muscles and Tendons Flexor, peroneal and extensor compartment tendons are intact. Diffuse fatty atrophy of the intrinsic muscles of the forefoot. Soft tissue Small ulceration at the tip of the second toe. Improved soft tissue swelling of the second toe and dorsal forefoot. No fluid collection or hematoma. No soft tissue mass. IMPRESSION: 1. Small soft tissue ulceration at the tip of the second toe with persistent osteomyelitis of the tip of the second distal phalanx. 2. Improved soft tissue swelling of the dorsal forefoot and second toe which could reflect resolving cellulitis or third spacing. No abscess. Electronically Signed   By: Obie DredgeWilliam T Derry M.D.   On: 06/10/2018 08:07   Mr Tibia Fibula Right W Wo Contrast  Result Date: 06/10/2018 CLINICAL DATA:  Recurrent right lower leg cellulitis. EXAM: MRI OF LOWER RIGHT EXTREMITY WITHOUT AND WITH CONTRAST TECHNIQUE: Multiplanar, multisequence MR imaging of the right lower leg was performed both before and after administration of intravenous contrast. CONTRAST:  10 mL  Gadavist intravenous contrast. COMPARISON:  Right tibia and fibula x-rays dated June 08, 2018. FINDINGS: Bones/Joint/Cartilage No marrow signal abnormality. No fracture or dislocation. Normal alignment. No joint effusion. Mild medial compartment osteoarthritis of the knee. Muscles and Tendons No muscle edema or atrophy. Soft tissue Extensive circumferential soft tissue swelling and enhancement of the mid to distal lower leg. No fluid collection or hematoma. No soft tissue mass. IMPRESSION: 1. Extensive circumferential soft tissue swelling and enhancement of the mid to distal lower leg, consistent with cellulitis. 2. No abscess or myositis. Electronically Signed   By: Obie DredgeWilliam T Derry M.D.   On: 06/10/2018 08:16        Scheduled Meds: . amLODipine  5 mg Oral Daily  . atorvastatin  20 mg Oral q1800  . cyclobenzaprine  10 mg Oral BID  . docusate sodium  100 mg Oral BID  . enoxaparin (LOVENOX) injection  80 mg Subcutaneous QHS  . insulin aspart  0-5 Units Subcutaneous QHS  . insulin aspart  0-9 Units Subcutaneous TID WC  . irbesartan  150 mg Oral Daily  . mupirocin cream   Topical Daily   Continuous Infusions: . sodium chloride 10 mL/hr at 06/11/18 1701  . clindamycin (CLEOCIN) IV 600 mg (06/11/18 1208)  . lactated ringers 50 mL/hr at 06/11/18 1701  . methocarbamol (ROBAXIN) IV       LOS: 3 days    Time spent: 40 minutes    Ramiro Harvest, MD [facility] Pager [phone]  If 7PM-7AM, please contact night-coverage [email or web address] Password [email or web address] 06/11/2018, 7:20 PM

## 2018-06-11 NOTE — Plan of Care (Signed)
  Problem: Clinical Measurements: Goal: Ability to maintain clinical measurements within normal limits will improve Outcome: Progressing   

## 2018-06-11 NOTE — Anesthesia Preprocedure Evaluation (Addendum)
Anesthesia Evaluation  Patient identified by MRN, date of birth, ID band Patient awake    Reviewed: Allergy & Precautions, NPO status , Patient's Chart, lab work & pertinent test results  History of Anesthesia Complications Negative for: history of anesthetic complications  Airway Mallampati: II  TM Distance: >3 FB Neck ROM: Full    Dental  (+) Dental Advisory Given, Teeth Intact   Pulmonary asthma , sleep apnea and Continuous Positive Airway Pressure Ventilation , former smoker,    breath sounds clear to auscultation       Cardiovascular hypertension, Pt. on medications (-) angina Rhythm:Regular Rate:Normal     Neuro/Psych PSYCHIATRIC DISORDERS Depression  Neuromuscular disease    GI/Hepatic negative GI ROS, (+)     substance abuse  ,   Endo/Other  diabetes, Type 2, Oral Hypoglycemic AgentsMorbid obesity  Renal/GU negative Renal ROS     Musculoskeletal  (+) Arthritis , narcotic dependent  Abdominal (+) + obese,   Peds  Hematology  (+) anemia ,   Anesthesia Other Findings   Reproductive/Obstetrics                            Anesthesia Physical Anesthesia Plan  ASA: III  Anesthesia Plan: General   Post-op Pain Management:    Induction: Intravenous  PONV Risk Score and Plan: 3 and Treatment may vary due to age or medical condition, Ondansetron, Dexamethasone and Midazolam  Airway Management Planned: LMA  Additional Equipment: None  Intra-op Plan:   Post-operative Plan: Extubation in OR  Informed Consent: I have reviewed the patients History and Physical, chart, labs and discussed the procedure including the risks, benefits and alternatives for the proposed anesthesia with the patient or authorized representative who has indicated his/her understanding and acceptance.   Dental advisory given  Plan Discussed with: CRNA and Anesthesiologist  Anesthesia Plan Comments:         Anesthesia Quick Evaluation

## 2018-06-11 NOTE — Anesthesia Procedure Notes (Signed)
Procedure Name: LMA Insertion Date/Time: 06/11/2018 2:26 PM Performed by: Modena MorrowGreenwood, Ethelwyn Gilbertson S, CRNA Pre-anesthesia Checklist: Patient identified, Emergency Drugs available, Suction available, Patient being monitored and Timeout performed Patient Re-evaluated:Patient Re-evaluated prior to induction Oxygen Delivery Method: Circle system utilized Preoxygenation: Pre-oxygenation with 100% oxygen Induction Type: IV induction Ventilation: Mask ventilation without difficulty and Oral airway inserted - appropriate to patient size LMA: LMA inserted LMA Size: 5.0 Number of attempts: 1 Tube secured with: Tape

## 2018-06-11 NOTE — Transfer of Care (Signed)
Immediate Anesthesia Transfer of Care Note  Patient: Robert [name]  Procedure(s) Performed: RIGHT 2ND RAY AMPUTATION (Right )  Patient Location: PACU  Anesthesia Type:General  Level of Consciousness: awake, alert  and patient cooperative  Airway & Oxygen Therapy: Patient Spontanous Breathing  Post-op Assessment: Report given to RN, Post -op Vital signs reviewed and stable and Patient moving all extremities  Post vital signs: Reviewed and stable  Last Vitals:  Vitals Value Taken Time  BP 126/72 06/11/2018  2:55 PM  Temp    Pulse 71 06/11/2018  2:56 PM  Resp 18 06/11/2018  2:56 PM  SpO2 95 % 06/11/2018  2:56 PM  Vitals shown include unvalidated device data.  Last Pain:  Vitals:   06/11/18 1158  TempSrc:   PainSc: 0-No pain      Patients Stated Pain Goal: 0 (06/11/18 1058)  Complications: No apparent anesthesia complications

## 2018-06-11 NOTE — Care Management Note (Signed)
Case Management Note  Patient Details  Name: Robert [name] MRN: [id number] Date of Birth: Dec 03, 1955  Subjective/Objective:                 Osteo, amputation to foot 11/27   Action/Plan:  Patient in OR at time of note. Patient from home w spouse and children.   Per Op note:  Dressing care/ Wound VAC: Leave dressing dry and intact until follow-up in the office in 1 week.  Unable to assess at this time if Proveena was applied in OR or if patient will DC with large home VAC.   Expected Discharge Date:                  Expected Discharge Plan:     In-House Referral:     Discharge planning Services  CM Consult  Post Acute Care Choice:    Choice offered to:     DME Arranged:    DME Agency:     HH Arranged:    HH Agency:     Status of Service:  In process, will continue to follow  If discussed at Long Length of Stay Meetings, dates discussed:    Additional Comments:  Lawerance SabalDebbie Willys Salvino, RN 06/11/2018, 3:20 PM

## 2018-06-11 NOTE — Progress Notes (Signed)
Called wife Revonda Standardllison for update.  Hermina BartersBOWMAN, Obdulia Steier M, RN

## 2018-06-11 NOTE — Anesthesia Postprocedure Evaluation (Signed)
Anesthesia Post Note  Patient: Robert [name]  Procedure(s) Performed: RIGHT 2ND RAY AMPUTATION (Right )     Patient location during evaluation: PACU Anesthesia Type: General Level of consciousness: awake and alert Pain management: pain level controlled Vital Signs Assessment: post-procedure vital signs reviewed and stable Respiratory status: spontaneous breathing, nonlabored ventilation and respiratory function stable Cardiovascular status: blood pressure returned to baseline and stable Postop Assessment: no apparent nausea or vomiting Anesthetic complications: no    Last Vitals:  Vitals:   06/11/18 1510 06/11/18 1527  BP: 121/64 115/69  Pulse: 66 64  Resp: 20 19  Temp: 36.9 C 36.9 C  SpO2: 97% 96%    Last Pain:  Vitals:   06/11/18 1527  TempSrc: Oral  PainSc:                  Beryle Lathehomas E Brock

## 2018-06-12 ENCOUNTER — Encounter (HOSPITAL_COMMUNITY): Payer: Self-pay | Admitting: Orthopedic Surgery

## 2018-06-12 LAB — BASIC METABOLIC PANEL
Anion gap: 6 (ref 5–15)
BUN: 13 mg/dL (ref 8–23)
CALCIUM: 8.9 mg/dL (ref 8.9–10.3)
CO2: 23 mmol/L (ref 22–32)
CREATININE: 0.9 mg/dL (ref 0.61–1.24)
Chloride: 104 mmol/L (ref 98–111)
GFR calc Af Amer: >60 mL/min (ref >60)
GFR calc non Af Amer: >60 mL/min (ref >60)
GLUCOSE: 159 mg/dL — AB (ref 70–99)
Potassium: 4.4 mmol/L (ref 3.5–5.1)
Sodium: 133 mmol/L — ABNORMAL LOW (ref 135–145)

## 2018-06-12 LAB — CBC WITH DIFFERENTIAL/PLATELET
BLASTS: 0 %
Band Neutrophils: 0 %
Basophils Absolute: 0 10*3/uL (ref 0.0–0.1)
Basophils Relative: 0 %
EOS PCT: 1 %
Eosinophils Absolute: 0.1 10*3/uL (ref 0.0–0.5)
HEMATOCRIT: 33.6 % — AB (ref 39.0–52.0)
Hemoglobin: 11.7 g/dL — ABNORMAL LOW (ref 13.0–17.0)
LYMPHS ABS: 0.7 10*3/uL (ref 0.7–4.0)
Lymphocytes Relative: 11 %
MCH: 30.1 pg (ref 26.0–34.0)
MCHC: 34.8 g/dL (ref 30.0–36.0)
MCV: 86.4 fL (ref 80.0–100.0)
METAMYELOCYTES PCT: 0 %
MONOS PCT: 1 %
Monocytes Absolute: 0.1 10*3/uL (ref 0.1–1.0)
Myelocytes: 1 %
NEUTROS ABS: 5.4 10*3/uL (ref 1.7–7.7)
NRBC: 0 % (ref 0.0–0.2)
Neutrophils Relative %: 86 %
Other: 0 %
Platelets: 195 10*3/uL (ref 150–400)
Promyelocytes Relative: 0 %
RBC: 3.89 MIL/uL — ABNORMAL LOW (ref 4.22–5.81)
RDW: 12.4 % (ref 11.5–15.5)
WBC: 6.3 10*3/uL (ref 4.0–10.5)
nRBC: 0 /100 WBC

## 2018-06-12 LAB — GLUCOSE, CAPILLARY
GLUCOSE-CAPILLARY: 126 mg/dL — AB (ref 70–99)
Glucose-Capillary: 158 mg/dL — ABNORMAL HIGH (ref 70–99)

## 2018-06-12 MED ORDER — HYDROCODONE-ACETAMINOPHEN 5-325 MG PO TABS: 1.0000 | ORAL_TABLET | ORAL | 0 refills | Status: DC | PRN

## 2018-06-12 MED ORDER — DOCUSATE SODIUM 100 MG PO CAPS: 100.0000 mg | ORAL_CAPSULE | Freq: Two times a day (BID) | ORAL | 0 refills | Status: DC

## 2018-06-12 MED ORDER — TRAMADOL HCL 50 MG PO TABS: 100.0000 mg | ORAL_TABLET | Freq: Four times a day (QID) | ORAL | Status: DC | PRN

## 2018-06-12 NOTE — Discharge Summary (Signed)
Physician Discharge Summary  Robert [name] EHU:[id number] DOB: [birth year] DOA: 06/08/2018  PCP: [name], DO  Admit date: 06/08/2018 Discharge date: 06/12/2018  Time spent: 50 minutes  Recommendations for Outpatient Follow-up:  1. Follow-up with Dr. Sharol Given, orthopedics in 1 week.   Discharge Diagnoses:  Principal Problem:   Recurrent cellulitis of lower extremity Active Problems:   Chronic back pain   h/o Narcotic abuse   OSA on CPAP- seen Dr Melvyn Novas in past   Chronic venous stasis dermatitis of both lower extremities   Type 2 diabetes mellitus with complication, without long-term current use of insulin ([place])   Essential hypertension   Osteomyelitis of toe of right foot ([place])   Osteomyelitis of second toe of right foot ([place])   Discharge Condition: Stable and improved  Diet recommendation: Carb modified  Filed Weights   06/08/18 1054 06/09/18 1504  Weight: (!) 156.5 kg (!) 156.6 kg    History of present illness:  Robert [name] is a 62 y.o. male with medical history significant for asthma, OSA on CPAP, type 2 diabetes mellitus, chronic pain with history of narcotic abuse, and recurrent lower extremity cellulitis currently completing a 48-monthcourse of Augmentin for this, and now presented to the emergency department for evaluation of fever and pain, swelling, and redness in the right lower extremity.  Patient reported a history of recurrent lower extremity cellulitis and stated that he had been on Augmentin at the direction of infectious disease specialist, had nearly completed his 323-monthourse, but woke the morning of admission with pain at the distal right leg, noted erythema and swelling, and later developed fever and chills.  His wife outlined the area of erythema with a pen, redness was noted to spread proximally throughout the day, pain also worsened, and the patient comes in for evaluation of this.  He denied any recent cough or increase in his chronic  shortness of breath.  Reported that a chronic wound at the tip of his toe had improved significantly and denied any drainage.  [facility] Course: Upon arrival to the ED, patient was found to be afebrile, saturating well on room air, and with vitals otherwise normal.  Lower extremity ultrasound was limited but negative for significant occlusive femoral-popliteal DVT in the right lower leg.  Radiographs of the distal leg are notable for soft tissue swelling without acute osseous abnormality.  Chemistry panel featured a slight hyponatremia and CBC was notable for slight chronic normocytic anemia.  Lactic acid was slightly elevated initially and normalized after 2 L of normal saline.  Patient was also treated with Norco and fentanyl, and he was started on IV clindamycin.  [facility] recurrent [facility] extremity cellulitis.  Hospital Course:  Recurrent cellulitis of lower extremity Right second toe osteomyelitis. Patient completed 6 weeks course of oral Augmentin just prior to being admitted and at least 2 weeks of antibiotics prior to that. Patient was placed on empiric IV clindamycin.  Cellulitis improved on IV antibiotics.  Lower extremity Dopplers which were done were negative for DVT. MRI of the right tibia/fibula and right foot obtained after discussing with ID showed changes of cellulitis on the tibia and fibula without myositis or osteomyelitis however showed persistent distal second toe osteomyelitis, without any abscess or fluid collection.  Discussed options of surgery with patient given persistent cellulitis (he is even followed by podiatrist as outpatient and has had debridement  of the ulcer done recently). Patient agreed on being evaluated by orthopedics.   Patient was seen in consultation by Dr. Sharol Given who recommended right second toe amputation.  Patient to the OR and underwent right second toe  amputation.  It was recommended Robert orthopedics that IV antibiotics to be continued for 24 hours postop which was done.  Patient was subsequently discharged home in stable and improved condition with close outpatient follow-up with orthopedics 1 week post discharge.   Active Problems: Chronic back pain/ h/o Narcotic abuse Patient maintained on home regimen of Flexeril and tramadol.  Essential hypertension Patient maintained on Norvasc during the hospitalization.    Hyperlipidemia Statin was continued during the hospitalization stable.  OSA on CPAP-  Patient was maintained on nighttime CPAP  Type 2 diabetes mellitus, controlled, without long-term current use of insulin ([place]) Diabetes well controlled.  Hemoglobin A1c 5.9 on 06/06/2018.    Patient was maintained on sliding scale insulin.   Morbid obesity Needs counseling on diet and exercise  HCV antibody positive Undetectable viral load. Normal LFTs.  Outpatient follow-up with PCP.   Procedures:  Plain films of the right tib-fib 06/08/2018  Plain films of the right foot 06/09/2018  MRI right tib-fib 06/09/2018  MRI right foot 06/09/2018  Right lower extremity Dopplers 06/08/2018  Right second toe amputation pending Robert Dr. Sharol Given 06/11/2018   Consultations:  Orthopedics: Dr. Sharol Given 06/11/2018  Infectious disease: Dr.Van Dam 06/09/2018   Discharge Exam: Vitals:   06/12/18 0436 06/12/18 0838  BP: (!) 94/42 (!) 107/56  Pulse: (!) 51 64  Resp: 16 20  Temp: 97.6 F (36.4 C) (!) 97.5 F (36.4 C)  SpO2: 99% 98%    General: NAD Cardiovascular: RRR Respiratory: CTAB  Discharge Instructions   Discharge Instructions    Diet Carb Modified   Complete by:  As directed    Elevate operative extremity   Complete by:  As directed    Touch down weight bearing   Complete by:  As directed    Laterality:  right   Extremity:  Lower     Allergies as of 06/12/2018   No Known Allergies     Medication  List    TAKE these medications   albuterol 108 (90 Base) MCG/ACT inhaler Commonly known as:  PROVENTIL HFA;VENTOLIN HFA Inhale 2 puffs into the lungs every 6 (six) hours as needed for wheezing or shortness of breath.   APPLE CIDER VINEGAR PO Take 15 mLs by mouth 2 (two) times daily.   atorvastatin 20 MG tablet Commonly known as:  LIPITOR TAKE 1 TABLET BY MOUTH EVERYDAY AT BEDTIME What changed:  See the new instructions.   B COMPLEX 100 PO Take 1 capsule by mouth every other day.   blood glucose meter kit and supplies Dispense based on patient and insurance preference. Use to check glucose fasting in the AM and then after largest meal of the day.. (FOR ICD-10 E10.9, E11.9).   blood glucose meter kit and supplies Kit Dispense based on patient and insurance preference. Use up to four times daily as directed. (FOR ICD-9 250.00, 250.01).   butalbital-acetaminophen-caffeine 50-325-40 MG tablet Commonly known as:  FIORICET, ESGIC Take 1 tablet by mouth every 6 (six) hours as needed for headache.   CALCIUM-MAGNESIUM-ZINC PO Take 1 tablet by mouth every other day.   cyanocobalamin 1000 MCG tablet Take 1 tablet (1,000 mcg total) by mouth daily.   cyclobenzaprine 10 MG tablet Commonly known as:  FLEXERIL TAKE 1 TABLET BY MOUTH TWICE  A DAY   DANDELION PO Take 1 tablet by mouth every other day.   docusate sodium 100 MG capsule Commonly known as:  COLACE Take 1 capsule (100 mg total) by mouth 2 (two) times daily.   glucose blood test strip Use to test blood sugar, test in the morning fasting and test after largest meal of the day.   HYDROcodone-acetaminophen 5-325 MG tablet Commonly known as:  NORCO/VICODIN Take 1-2 tablets by mouth every 4 (four) hours as needed for moderate pain (pain score 4-6).   meloxicam 15 MG tablet Commonly known as:  MOBIC Take 1 tablet (15 mg total) by mouth daily. What changed:    when to take this  reasons to take this   metFORMIN 500 MG  tablet Commonly known as:  GLUCOPHAGE TAKE 2 TABLETS (1,000 MG TOTAL) BY MOUTH 2 (TWO) TIMES DAILY WITH A MEAL.   mupirocin ointment 2 % Commonly known as:  BACTROBAN Apply 1 application topically daily.   naphazoline-pheniramine 0.025-0.3 % ophthalmic solution Commonly known as:  NAPHCON-A Place 1 drop into both eyes 4 (four) times daily as needed for eye irritation.   Olmesartan-amLODIPine-HCTZ 20-5-12.5 MG Tabs Take 1 tablet by mouth at bedtime.   ONE TOUCH ULTRA MINI w/Device Kit Use to test blood sugar, test in the morning fasting and test after largest meal of the day.   ONETOUCH DELICA LANCETS 36U Misc Use for testing blood sugar, test once in the morning fasting and test after largest meal of the day.   Potassium 99 MG Tabs Take 99 mg by mouth every other day.   traMADol 50 MG tablet Commonly known as:  ULTRAM Take 2 tablets (100 mg total) by mouth every 6 (six) hours as needed. For pain What changed:    when to take this  reasons to take this   TURMERIC CURCUMIN PO Take 1 tablet by mouth every other day.   vitamin C 500 MG tablet Commonly known as:  ASCORBIC ACID Take 500 mg by mouth daily.   Vitamin D (Ergocalciferol) 1.25 MG (50000 UT) Caps capsule Commonly known as:  DRISDOL TAKE 1 CAPSULE WEEKLY What changed:  when to take this   Vitamin D3 125 MCG (5000 UT) Tabs 5,000 IU OTC vitamin D3 daily. What changed:    how much to take  how to take this  when to take this  additional instructions   VITAMIN E PO Take 1 tablet by mouth daily.            Durable Medical Equipment  (From admission, onward)         Start     Ordered   06/12/18 1230  For home use only DME Walker rolling  Once    Question Answer Comment  Patient needs a walker to treat with the following condition Osteomyelitis [facility])   Patient needs a walker to treat with the following condition Debility      06/12/18 1230   06/12/18 1127  For home use only DME Walker rolling   Once    Question:  Patient needs a walker to treat with the following condition  Answer:  Weakness   06/12/18 1126           Discharge Care Instructions  (From admission, onward)         Start     Ordered   06/12/18 0000  Touch down weight bearing    Question Answer Comment  Laterality right   Extremity Lower  06/12/18 0903         No Known Allergies Follow-up Information    Newt Minion, MD In 1 week.   Specialty:  Orthopedic Surgery Contact information: [place] [address] [phone]            The results of significant diagnostics from this hospitalization (including imaging, microbiology, ancillary and laboratory) are listed below for reference.    Significant Diagnostic Studies: Dg Tibia/fibula Right  Result Date: 06/08/2018 CLINICAL DATA:  Acute RIGHT LOWER leg pain, swelling and fever. EXAM: RIGHT TIBIA AND FIBULA - 2 VIEW COMPARISON:  None. FINDINGS: Apparent soft tissue swelling noted. No fracture, subluxation, dislocation or evidence of acute osteomyelitis noted. No soft tissue gas is identified. No radiopaque foreign bodies are noted. Degenerative changes in the knee are present. IMPRESSION: 1. Soft tissue swelling without acute bony abnormality. Electronically Signed   By: Margarette Canada M.D.   On: 06/08/2018 14:06   Mr Foot Right Wo Contrast  Result Date: 06/10/2018 CLINICAL DATA:  Second toe osteomyelitis. EXAM: MRI OF THE RIGHT FOREFOOT WITHOUT CONTRAST TECHNIQUE: Multiplanar, multisequence MR imaging of the right forefoot was performed. No intravenous contrast was administered. COMPARISON:  Right foot x-rays from same day. MRI right foot dated April 09, 2018. FINDINGS: Bones/Joint/Cartilage Unchanged marrow edema with corresponding T1 marrow hypointensity involving the tip of the second distal phalanx. No fracture or dislocation. Normal alignment. No joint effusion. 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 Unchanged mild third tarsometatarsal joint osteoarthritis. Ligaments Collateral ligaments are intact.  Lisfranc ligament is intact. Muscles and Tendons Flexor, peroneal and extensor compartment tendons are intact. Diffuse fatty atrophy of the intrinsic muscles of the forefoot. Soft tissue Small ulceration at the tip of the second toe. Improved soft tissue swelling of the second toe and dorsal forefoot. No fluid collection or hematoma. No soft tissue mass. IMPRESSION: 1. Small soft tissue ulceration at the tip of the second toe with persistent osteomyelitis of the tip of the second distal phalanx. 2. Improved soft tissue swelling of the dorsal forefoot and second toe which could reflect resolving cellulitis or third spacing. No abscess. Electronically Signed   By: Titus Dubin M.D.   On: 06/10/2018 08:07   Mr Tibia Fibula Right W Wo Contrast  Result Date: 06/10/2018 CLINICAL DATA:  Recurrent right lower leg cellulitis. EXAM: MRI OF LOWER RIGHT EXTREMITY WITHOUT AND WITH CONTRAST TECHNIQUE: Multiplanar, multisequence MR imaging of the right lower leg was performed both before and after administration of intravenous contrast. CONTRAST:  10 mL Gadavist intravenous contrast. COMPARISON:  Right tibia and fibula x-rays dated June 08, 2018. FINDINGS: Bones/Joint/Cartilage No marrow signal abnormality. No fracture or dislocation. Normal alignment. No joint effusion. Mild medial compartment osteoarthritis of the knee. Muscles and Tendons No muscle edema or atrophy. Soft tissue Extensive circumferential soft tissue swelling and enhancement of the mid to distal lower leg. No fluid collection or hematoma. No soft tissue mass. IMPRESSION: 1. Extensive circumferential soft tissue swelling and enhancement of the mid to distal lower leg, consistent with cellulitis. 2. No abscess or myositis. Electronically Signed   By: Titus Dubin M.D.   On: 06/10/2018 08:16   US Venous Img Lower Right (dvt  Study)  Result Date: 06/08/2018 CLINICAL DATA:  Right lower extremity current cellulitis, pain and edema EXAM: RIGHT LOWER EXTREMITY VENOUS DOPPLER ULTRASOUND TECHNIQUE: Gray-scale sonography with graded compression, as well as color Doppler and duplex ultrasound were performed to evaluate the lower extremity deep  venous systems from the level of the common femoral vein and including the common femoral, femoral, profunda femoral, popliteal and calf veins including the posterior tibial, peroneal and gastrocnemius veins when visible. The superficial great saphenous vein was also interrogated. Spectral Doppler was utilized to evaluate flow at rest and with distal augmentation maneuvers in the common femoral, femoral and popliteal veins. COMPARISON:  None. FINDINGS: Contralateral Common Femoral Vein: Respiratory phasicity is normal and symmetric with the symptomatic side. No evidence of thrombus. Normal compressibility. Common Femoral Vein: No evidence of thrombus. Normal compressibility, respiratory phasicity and response to augmentation. Saphenofemoral Junction: No evidence of thrombus. Normal compressibility and flow on color Doppler imaging. Profunda Femoral Vein: No evidence of thrombus. Normal compressibility and flow on color Doppler imaging. Femoral Vein: No evidence of thrombus. Normal compressibility, respiratory phasicity and response to augmentation. Popliteal Vein: No evidence of thrombus. Normal compressibility, respiratory phasicity and response to augmentation. Calf Veins: Limited assessment of the calf veins. Posterior tibial vein appears patent. Peroneal vein not visualized. Superficial Great Saphenous Vein: No evidence of thrombus. Normal compressibility. Venous Reflux:  None. Other Findings:  Peripheral calf edema noted. IMPRESSION: No significant occlusive right lower extremity femoropopliteal DVT. Limited assessment of the calf veins. Peripheral subcutaneous edema. Electronically Signed   By: Jerilynn Mages.   Shick M.D.   On: 06/08/2018 13:07   Dg Foot 2 Views Right  Result Date: 06/09/2018 CLINICAL DATA:  Osteomyelitis of right toe and foot EXAM: RIGHT FOOT - 2 VIEW COMPARISON:  None. FINDINGS: Advanced osteoarthritic changes at the 1st MTP joint. No fracture, subluxation or dislocation. No bony destructive changes seen to suggest or confirm osteomyelitis. Soft tissues are intact. 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 No radiographic changes of osteomyelitis. If clinical concern persists, MRI may be helpful. Electronically Signed   By: Rolm Baptise M.D.   On: 06/09/2018 11:06    Microbiology: Recent Results (from the past 240 hour(s))  MRSA PCR Screening     Status: None   Collection Time: 06/10/18  4:22 PM  Result Value Ref Range Status   MRSA by PCR NEGATIVE NEGATIVE Final    Comment:        The GeneXpert MRSA Assay (FDA approved for NASAL specimens only), is one component of a comprehensive MRSA colonization surveillance program. It is not intended to diagnose MRSA infection nor to guide or monitor treatment for MRSA infections. Performed at [facility], [place] [address] [place], [place] [address]      Labs: Basic Metabolic Panel: Recent Labs  Lab 06/08/18 1108 06/09/18 0436 06/11/18 0902 06/12/18 0228  NA 134* 138 137 133*  K 3.7 3.9 4.2 4.4  CL 102 105 104 104  CO2 _0 GLUCOSE 152* 123* 138* 159*  BUN _1 CREATININE 1.02 0.86 0.96 0.90  CALCIUM 9.1 8.9 9.1 8.9   Liver Function Tests: Recent Labs  Lab 06/08/18 1108  AST 31  ALT 43  ALKPHOS 55  BILITOT 1.1  PROT 7.7  ALBUMIN 4.2   No results for input(s): LIPASE, AMYLASE in the last 168 hours. No results for input(s): AMMONIA in the last 168 hours. CBC: Recent Labs  Lab 06/08/18 1108 06/09/18 0436 06/11/18 0902 06/12/18 0228  WBC 8.2 5.2 4.0 6.3  NEUTROABS 6.7 3.2 2.4 5.4  HGB 12.7* 11.0* 11.5* 11.7*  HCT 37.0* 32.8* 34.7* 33.6*  MCV  89.4 91.1 89.0 86.4  PLT 154 136* 197 195   Cardiac  Enzymes: No results for input(s): CKTOTAL, CKMB, CKMBINDEX, TROPONINI in the last 168 hours. BNP: BNP (last 3 results) No results for input(s): BNP in the last 8760 hours.  ProBNP (last 3 results) No results for input(s): PROBNP in the last 8760 hours.  CBG: Recent Labs  Lab 06/11/18 1455 06/11/18 1706 06/11/18 2125 06/12/18 0740 06/12/18 1146  GLUCAP 95 113* 209* 158* 126*       Signed:  Irine Seal MD.  [facility] 06/12/2018, 1:16 PM

## 2018-06-12 NOTE — Evaluation (Signed)
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 PMH includes morbid obesity, OSA on CPAP, narcotic abuse.  Clinical Impression  Patient presents with impaired sensation BLEs and impaired mobility secondary to new TDWB status RLE. Pt Mod I PTA using walking stick at home. Reports finding new cuts/scrapes on BLEs due to not being able to feel very well. Lengthy discussion with pt regarding WB status- importance of healing and use of RW. Tolerated gait training with Min guard assist for balance/safety. Not able to hop for long periods of time due to DOE, endurance and balance. Declined use of w/c. Discussed in detail how to negotiate steps with help of wife/daughters. Will follow acutely to maximize independence and mobility prior to return home.    Follow Up Recommendations Supervision for mobility/OOB;No PT follow up    Equipment Recommendations  Rolling walker with 5" wheels    Recommendations for Other Services       Precautions / Restrictions Precautions Precautions: Fall Required Braces or Orthoses: Other Brace Other Brace: post op shoe Restrictions Weight Bearing Restrictions: Yes RLE Weight Bearing: Touchdown weight bearing      Mobility  Bed Mobility Overal bed mobility: Needs Assistance Bed Mobility: Supine to Sit     Supine to sit: Modified independent (Device/Increase time);HOB elevated     General bed mobility comments: No assist needed.  Transfers Overall transfer level: Needs assistance Equipment used: Rolling walker (2 wheeled) Transfers: Sit to/from Stand Sit to Stand: Supervision         General transfer comment: Supervision for safety. Stood from [employer], transferred to chair post  ambulation.  Ambulation/Gait Ambulation/Gait assistance: Min Emergency planning/management officer (Feet): 60 Feet Assistive device: Rolling walker (2 wheeled) Gait Pattern/deviations: Step-through pattern;Decreased stance time - right;Decreased step length - left;Trunk flexed Gait velocity: decreased   General Gait Details: Slow, mildly unsteady gait with pt demonstrating "hop to" gait but fatigues and needs to place more weight through RLE for balance. Use of RW.  Stairs            Wheelchair Mobility    Modified Rankin (Stroke Patients Only)       Balance Overall balance assessment: Needs assistance Sitting-balance support: Feet supported;No upper extremity supported Sitting balance-Leahy Scale: Good     Standing balance support: During functional activity Standing balance-Leahy Scale: Fair Standing balance comment: Able to stand statically without UE support; requires UE support for gait due to WB status                             Pertinent Vitals/Pain Pain Assessment: Faces Faces Pain Scale: Hurts a little bit Pain Location: right foot Pain Descriptors / Indicators: Sore;Operative site guarding Pain Intervention(s): Monitored during session;Repositioned    Home Living Family/patient expects to be discharged to:: Private residence Living Arrangements: Spouse/significant other;Children Available Help at Discharge: Family;Available PRN/intermittently Type of Home: House Home Access: Stairs to enter Entrance Stairs-Rails: Doctor, general practice of Steps: 4 Home Layout: One level Home Equipment: Cane - single point      Prior Function Level of Independence: Independent with assistive device(s)         Comments: Uses walking stick PTA. Does some house work. Works in his music room. Wife  is a Runner, broadcasting/film/videoteacher and has 2 teenage daughters.     Hand Dominance        Extremity/Trunk Assessment   Upper Extremity Assessment Upper Extremity  Assessment: Defer to OT evaluation    Lower Extremity Assessment Lower Extremity Assessment: RLE deficits/detail;LLE deficits/detail RLE Deficits / Details: some drainage in bandage around toes; able to wiggle hallux. Impaired sensation throughout RLE RLE Sensation: decreased light touch;history of peripheral neuropathy LLE Deficits / Details: Impaired sensation throughout entire LE and into foot LLE Sensation: decreased light touch;history of peripheral neuropathy       Communication   Communication: No difficulties  Cognition Arousal/Alertness: Awake/alert Behavior During Therapy: WFL for tasks assessed/performed Overall Cognitive Status: Within Functional Limits for tasks assessed                                 General Comments: however question safety awareness because despite knowing WB status, placing weight through RLE.      General Comments General comments (skin integrity, edema, etc.): Drainage present on right toes.     Exercises     Assessment/Plan    PT Assessment Patient needs continued PT services  PT Problem List Decreased strength;Decreased mobility;Decreased safety awareness;Pain;Impaired sensation;Decreased balance;Decreased activity tolerance;Decreased cognition;Decreased skin integrity;Decreased knowledge of precautions;Decreased knowledge of use of DME       PT Treatment Interventions Functional mobility training;Balance training;Patient/family education;Gait training;Therapeutic activities;Therapeutic exercise;DME instruction;Stair training    PT Goals (Current goals can be found in the Care Plan section)  Acute Rehab PT Goals Patient Stated Goal: to go home for thanksgiving PT Goal Formulation: With patient Time For Goal Achievement: 06/26/18 Potential to Achieve Goals: Good    Frequency Min 3X/week   Barriers to discharge Inaccessible home environment stairs to enter home    Co-evaluation               AM-PAC PT "6  Clicks" Mobility  Outcome Measure Help needed turning from your back to your side while in a flat bed without using bedrails?: None Help needed moving from lying on your back to sitting on the side of a flat bed without using bedrails?: A Little Help needed moving to and from a bed to a chair (including a wheelchair)?: A Little Help needed standing up from a chair using your arms (e.g., wheelchair or bedside chair)?: None Help needed to walk in hospital room?: A Little Help needed climbing 3-5 steps with a railing? : A Little 6 Click Score: 20    End of Session Equipment Utilized During Treatment: Gait belt Activity Tolerance: Patient tolerated treatment well Patient left: in chair;with call bell/phone within reach Nurse Communication: Mobility status;Other (comment)(need for RW) PT Visit Diagnosis: Difficulty in walking, not elsewhere classified (R26.2);Unsteadiness on feet (R26.81)    Time: [id number] PT Time Calculation (min) (ACUTE ONLY): 25 min   Charges:   PT Evaluation $PT Eval Moderate Complexity: 1 Mod PT Treatments $Gait Training: 8-22 mins        Mylo RedShauna Almee Pelphrey, PT, DPT Acute Rehabilitation Services Pager [phone] Office [phone]      Blake DivineShauna A Lanier EnsignHartshorne 06/12/2018, 12:07 PM

## 2018-06-12 NOTE — Care Management Note (Signed)
Case Management Note RN CM Holiday coverage for 5N [phone]  Patient Details  Name: Robert [name] MRN: [id number] Date of Birth: 1955/08/26  Subjective/Objective:                  Pt admitted with osteo s/p amputation of foot 11/27  Action/Plan: Pt for transition home today- per PT eval pt needs RW- order placed and [facility]- RW to be delivered to room prior to discharge. Spoke with bedside RN- pt will go home on po abx and no wound VAC.   Expected Discharge Date:  06/12/18               Expected Discharge Plan:  Home/Self Care  In-House Referral:     Discharge planning Services  CM Consult  Post Acute Care Choice:  Durable Medical Equipment Choice offered to:  Patient  DME Arranged:  Dan HumphreysWalker rolling DME Agency:  [facility]  [facility]    [facility] Agency:     Status of Service:  Completed, signed off  If discussed at Long Length of Stay Meetings, dates discussed:   Discharge Disposition: home/self care    Additional Comments:  Darrold SpanWebster, Marycatherine Maniscalco Hall, RN 06/12/2018, 1:39 PM

## 2018-06-12 NOTE — Plan of Care (Signed)

## 2018-06-12 NOTE — Progress Notes (Signed)
Patient ID: Robert [name], male   DOB: [birth year], 62 y.o.   MRN: [id number] Patient has been up walking on his operative foot.  Discussed that patient should be nonweightbearing on the right lower extremity.  Will change the dressing at follow-up in the office.  Patient is to keep the dressing clean and dry.  Patient is safe for discharge to home from an orthopedic standpoint.  Patient may need a walker for discharge to home.

## 2018-06-12 NOTE — Progress Notes (Signed)
[facility]  [facility] will follow pt with ID and Ortho teams to support Home Infusion Pharmacy services at [place] if IV ABX are needed.  If patient discharges after hours, please call [phone].   [name] 06/12/2018, 9:53 AM

## 2018-06-12 NOTE — Progress Notes (Signed)
Pt alert, able to make needs known. No acute distress noted this shift. C/o pain leg/foot. Medicated as needed. Pt stable, ready for discharge home.

## 2018-06-16 ENCOUNTER — Other Ambulatory Visit: Payer: Self-pay

## 2018-06-16 NOTE — Patient Outreach (Signed)
[facility] Care Management  06/16/2018  Robert [name] 12-13-1955 [id number]   Telephone Screen  Referral Date: 06/16/18 Referral Source: Patient/self Referral Reason: " transportation assistance" Insurance: Medicare   Outreach attempt # 1 to patient. No answer at present. RN CM left HIPAA compliant voicemail message along with contact info.       Plan: RN CM will make outreach attempt to patient within 3-4 business days. RN CM will send unsuccessful outreach letter to patient.    Antionette Fairyoshanda Givanni Staron, RN,BSN,CCM [facility] Care [facility] Telephonic Care Management Coordinator Direct Phone: [phone] Toll Free: [phone] Fax: [phone]

## 2018-06-16 NOTE — Patient Outreach (Signed)
Care coordination: Incoming call/voicemail received from patient requesting assistance with transportation due to recent surgery and inability to drive.   PLAN: will place [facility] for [facility] social worker to call patient.  Rowe PavyAmanda Kourtland Coopman, RN, BSN, CEN [facility] [employer] [facility] [employer] Care Coordinator [phone]

## 2018-06-17 ENCOUNTER — Other Ambulatory Visit: Payer: Self-pay

## 2018-06-17 ENCOUNTER — Telehealth: Payer: Self-pay | Admitting: *Deleted

## 2018-06-17 ENCOUNTER — Telehealth: Payer: Self-pay | Admitting: Family Medicine

## 2018-06-17 NOTE — Patient Outreach (Signed)
[facility] Care Management  06/17/2018  Robert [name] 20-Jan-1956 [id number]     Telephone Screen  Referral Date: 06/16/18 Referral Source: Patient/self Referral Reason: " transportation assistance" Insurance: Medicare    Voicemail message received from patient. Patient states that he no longer needs transportation assistance as he has "gotten it figured out." He states that he does not need to be contacted back. He reports that [facility] and knows that he can call again in the future if needs arise.   Plan: RN CM will close case at this time.   Antionette Fairyoshanda Doneta Bayman, RN,BSN,CCM [facility] Care Management Telephonic Care Management Coordinator Direct Phone: [phone] Toll Free: [phone] Fax: [phone]

## 2018-06-17 NOTE — Telephone Encounter (Signed)
Patient's toe was amputated 11/27. He follows with orthopedics 12/4 for his first post-op appointment. He would like to know if he still needs to follow up with [facility] on 12/5 (previously scheduled on 11/18 for cellulitis follow up). Andree CossHowell, Rozann Holts M, RN

## 2018-06-17 NOTE — Telephone Encounter (Signed)
Given that he has had amputation of his right second toe and has follow-up with his surgeon that seems quite reasonable for him to cancel his upcoming visit with me.

## 2018-06-17 NOTE — Telephone Encounter (Signed)
Patient called states he as in hospital for toe removal surgery & missed his OV w/ Dr. Val Eagle--- Patient requested call back to discuss results from missed OV .  --forwarding request to medical assistant .   -glh

## 2018-06-17 NOTE — Patient Outreach (Signed)
Care Coordination:  Placed call to follow up with patient who reports that he has gotten his transportation arranged. Reports that he did speak with Child psychotherapistsocial worker.  Patient is appreciative of call and offer of help.  Rowe PavyAmanda Mckinleigh Schuchart, RN, BSN, CEN [facility] Care Coordinator [phone]

## 2018-06-18 ENCOUNTER — Ambulatory Visit (INDEPENDENT_AMBULATORY_CARE_PROVIDER_SITE_OTHER): Payer: Medicare Other | Admitting: Physician Assistant

## 2018-06-18 ENCOUNTER — Encounter (INDEPENDENT_AMBULATORY_CARE_PROVIDER_SITE_OTHER): Payer: Self-pay | Admitting: Physician Assistant

## 2018-06-18 VITALS — Ht 74.0 in | Wt 345.2 lb

## 2018-06-18 DIAGNOSIS — I87323 Chronic venous hypertension (idiopathic) with inflammation of bilateral lower extremity: Secondary | ICD-10-CM

## 2018-06-18 DIAGNOSIS — E1142 Type 2 diabetes mellitus with diabetic polyneuropathy: Secondary | ICD-10-CM

## 2018-06-18 DIAGNOSIS — Z89421 Acquired absence of other right toe(s): Secondary | ICD-10-CM

## 2018-06-18 MED ORDER — HYDROCODONE-ACETAMINOPHEN 5-325 MG PO TABS: 1.0000 | ORAL_TABLET | Freq: Four times a day (QID) | ORAL | 0 refills | Status: DC | PRN

## 2018-06-18 NOTE — Progress Notes (Signed)
Office Visit Note   Patient: Robert [name]           Date of Birth: 1956-06-16           MRN: [id number] Visit Date: 06/18/2018              Requested by: Thomasene Lot, DO [address] [place], [place] [address] PCP: Thomasene Lot, DO  Chief Complaint  Patient presents with  . Right Foot - Routine Post Op    Right 2nd ray amp      HPI: The patient is a 62 yo male who is seen for post operative follow up following right 2nd toe amputation on 06/11/2018 for osteomyelitis. He has been non weight bearing with a walker and post operative shoe. He reports moderate pain at times, mainly at night. He is not on antibiotics.   Assessment & Plan: Visit Diagnoses:  1. Status post amputation of lesser toe of right foot (HCC)   2. Type 2 diabetes mellitus with diabetic polyneuropathy, without long-term current use of insulin (HCC)   3. Chronic venous hypertension (idiopathic) with inflammation of bilateral lower extremity     Plan: Counseled may shower and wash foot with Dial soap and water daily and apply dry gauze dressing to site and Ace wrap to secure. Counseled to use Whey protein supplement, vitamins, Probiotics as well.  Follow up in 1 week.   Follow-Up Instructions: Return in about 6 days (around 06/24/2018).   Ortho Exam  Patient is alert, oriented, no adenopathy, well-dressed, normal affect, normal respiratory effort. The right second toe amputation site sutures are in place, mild widening of incision with minimal serosanguinous drainage. No signs of infection or cellulitis. Moderate pitting edema of right leg. Palpable dorsalis pedis pulse.   Imaging: No results found. No images are attached to the encounter.  Labs: Lab Results  Component Value Date   HGBA1C 5.9 (H) 06/06/2018   HGBA1C 5.8 (H) 03/17/2018   HGBA1C 6.1 (A) 01/09/2018   ESRSEDRATE 60 (H) 04/10/2018   ESRSEDRATE 45 (H) 04/08/2018   ESRSEDRATE 15 03/15/2018   CRP 6.0 (H) 04/10/2018   CRP 14.8  (H) 03/16/2018   REPTSTATUS 04/12/2018 FINAL 04/07/2018   CULT  04/07/2018    NO GROWTH 5 DAYS Performed at [facility], [address] [place], [place] [address]      Lab Results  Component Value Date   ALBUMIN 4.2 06/08/2018   ALBUMIN 4.4 04/28/2018   ALBUMIN 3.3 (L) 04/10/2018    Body mass index is 44.32 kg/m.  Orders:  No orders of the defined types were placed in this encounter.  Meds ordered this encounter  Medications  . HYDROcodone-acetaminophen (NORCO/VICODIN) 5-325 MG tablet    Sig: Take 1 tablet by mouth every 6 (six) hours as needed for moderate pain (pain score 4-6).    Dispense:  20 tablet    Refill:  0     Procedures: No procedures performed  Clinical Data: No additional findings.  ROS:  All other systems negative, except as noted in the HPI. Review of Systems  Objective: Vital Signs: Ht 6\' 2"  (1.88 m)   Wt (!) 345 lb 3.2 oz (156.6 kg)   BMI 44.32 kg/m   Specialty Comments:  No specialty comments available.  PMFS History: Patient Active Problem List   Diagnosis Date Noted  . Osteomyelitis of second toe of right foot (HCC)   . Lower extremity cellulitis 06/08/2018  . Hepatitis C antibody test positive  04/16/2018  . Osteomyelitis of toe of right foot (HCC) 04/10/2018  . Hyperlipidemia 04/08/2018  . Recurrent cellulitis of lower extremity 04/07/2018  . Chronic neuropathy of both feet-  due to lumbar radiculopathy as well as diabetic neuropathy 03/25/2018  . Pancytopenia (HCC) 03/15/2018  . DJD (degenerative joint disease) 01/09/2018  . Type 2 diabetes mellitus with complication, without long-term current use of insulin (HCC) 09/30/2017  . Essential hypertension 09/30/2017  . Mixed diabetic hyperlipidemia associated with type 2 diabetes mellitus (HCC) 09/30/2017  . Barrett's esophagus 12/07/2016  . Chronic venous stasis dermatitis of both lower extremities 05/08/2016  . Vitamin D deficiency 05/08/2016  . OSA on CPAP- seen Dr Sherene SiresWert  in past 04/11/2016  . Bilateral lower extremity pitting edema 04/11/2016  . Physical deconditioning 04/11/2016  . Restrictive lung disease secondary to obesity 04/11/2016  . History of tobacco use 04/11/2016  . h/o Depressive disorder 04/11/2016  . Lumbar radiculopathy 07/21/2012  . Chronic pain due to injury 07/21/2012  . Morbid obesity BMI >er 48 02/15/2012  . Postlaminectomy syndrome, lumbar region 01/04/2012  . h/o Narcotic abuse 01/04/2012  . Constipation 09/22/2011  . Chronic back pain 09/22/2011   Past Medical History:  Diagnosis Date  . Allergic rhinitis due to pollen   . Arthritis   . Asthma   . Back pain   . Barrett esophagus   . Depressive disorder   . Diabetes mellitus type 2 in obese (HCC)   . Exposure to hepatitis B   . Exposure to hepatitis C   . Narcotic abuse (HCC)    pain medications  . OSA (obstructive sleep apnea)    on CPAP     Family History  Problem Relation Age of Onset  . Allergies Mother   . Early death Father   . Thyroid disease Sister   . Colon cancer Neg Hx   . Stomach cancer Neg Hx     Past Surgical History:  Procedure Laterality Date  . ABDOMINAL SURGERY    . AMPUTATION Right 06/11/2018   Procedure: RIGHT 2ND RAY AMPUTATION;  Surgeon: Nadara Mustarduda, Marcus V, MD;  [facility];  Service: Orthopedics;  Laterality: Right;  . BACK SURGERY     Rods and screws in lumbar area  . COLONOSCOPY    . ESOPHAGOGASTRODUODENOSCOPY  multiple  . HERNIA REPAIR     6  . JOINT REPLACEMENT    . TOTAL KNEE ARTHROPLASTY  2009   left   Social History   Occupational History  . Occupation: Musician, Disabled  Tobacco Use  . Smoking status: Former Smoker    Packs/day: 0.80    Years: 15.00    Pack years: 12.00    Types: Cigarettes    Last attempt to quit: 07/16/2004    Years since quitting: 13.9  . Smokeless tobacco: Never Used  Substance and Sexual Activity  . Alcohol use: No  . Drug use: No    Comment: Prior abuse of narcotics  . Sexual activity:  Never

## 2018-06-18 NOTE — Telephone Encounter (Signed)
Left message on patient's voicemail letting him know he did not need to see Dr Orvan Falconerampbell on 12/5 since he had the amputation.

## 2018-06-18 NOTE — Telephone Encounter (Signed)
Called and spoke to patient in regards to labs. MPulliam, CMA/RT(R)

## 2018-06-19 ENCOUNTER — Ambulatory Visit: Payer: Medicare Other | Admitting: Internal Medicine

## 2018-06-23 ENCOUNTER — Ambulatory Visit: Payer: Medicare Other | Admitting: Podiatry

## 2018-06-24 ENCOUNTER — Ambulatory Visit (INDEPENDENT_AMBULATORY_CARE_PROVIDER_SITE_OTHER): Payer: Medicare Other | Admitting: Orthopedic Surgery

## 2018-06-24 ENCOUNTER — Encounter (INDEPENDENT_AMBULATORY_CARE_PROVIDER_SITE_OTHER): Payer: Self-pay | Admitting: Orthopedic Surgery

## 2018-06-24 VITALS — Ht 74.0 in | Wt 345.2 lb

## 2018-06-24 DIAGNOSIS — M47816 Spondylosis without myelopathy or radiculopathy, lumbar region: Secondary | ICD-10-CM | POA: Diagnosis not present

## 2018-06-24 DIAGNOSIS — G894 Chronic pain syndrome: Secondary | ICD-10-CM | POA: Diagnosis not present

## 2018-06-24 DIAGNOSIS — Z89421 Acquired absence of other right toe(s): Secondary | ICD-10-CM

## 2018-06-24 DIAGNOSIS — M25569 Pain in unspecified knee: Secondary | ICD-10-CM | POA: Diagnosis not present

## 2018-06-24 DIAGNOSIS — Z79891 Long term (current) use of opiate analgesic: Secondary | ICD-10-CM | POA: Diagnosis not present

## 2018-06-24 DIAGNOSIS — Z79899 Other long term (current) drug therapy: Secondary | ICD-10-CM | POA: Diagnosis not present

## 2018-06-24 NOTE — Progress Notes (Signed)
Office Visit Note   Patient: Robert [name]           Date of Birth: [birth year]           MRN: [id number] Visit Date: 06/24/2018              Requested by: Thomasene Lot, DO [address] [place], [place] [address] PCP: Thomasene Lot, DO  Chief Complaint  Patient presents with  . Right Foot - Routine Post Op    Right 2nd ray amp      HPI: Patient is a 62 year old gentleman who presents 2 weeks status post right foot second ray amputation.  Patient feels like he is doing well denies any drainage pain or swelling.  Assessment & Plan: Visit Diagnoses:  1. Status post amputation of lesser toe of right foot (HCC)     Plan: Sutures are harvested he will increase his activities as tolerated in a regular sneaker.  Recommended compression stockings for the venous stasis swelling.  Follow-Up Instructions: Return in about 4 weeks (around 07/22/2018).   Ortho Exam  Patient is alert, oriented, no adenopathy, well-dressed, normal affect, normal respiratory effort. Examination incision is well-healed there is no drainage no cellulitis he does have venous stasis swelling in the foot and leg.  Imaging: No results found. No images are attached to the encounter.  Labs: Lab Results  Component Value Date   HGBA1C 5.9 (H) 06/06/2018   HGBA1C 5.8 (H) 03/17/2018   HGBA1C 6.1 (A) 01/09/2018   ESRSEDRATE 60 (H) 04/10/2018   ESRSEDRATE 45 (H) 04/08/2018   ESRSEDRATE 15 03/15/2018   CRP 6.0 (H) 04/10/2018   CRP 14.8 (H) 03/16/2018   REPTSTATUS 04/12/2018 FINAL 04/07/2018   CULT  04/07/2018    NO GROWTH 5 DAYS Performed at [facility], [address] [place], [place] [address]      Lab Results  Component Value Date   ALBUMIN 4.2 06/08/2018   ALBUMIN 4.4 04/28/2018   ALBUMIN 3.3 (L) 04/10/2018    Body mass index is 44.32 kg/m.  Orders:  No orders of the defined types were placed in this encounter.  No orders of the defined types were placed in this  encounter.    Procedures: No procedures performed  Clinical Data: No additional findings.  ROS:  All other systems negative, except as noted in the HPI. Review of Systems  Objective: Vital Signs: Ht 6\' 2"  (1.88 m)   Wt (!) 345 lb 3.2 oz (156.6 kg)   BMI 44.32 kg/m   Specialty Comments:  No specialty comments available.  PMFS History: Patient Active Problem List   Diagnosis Date Noted  . Osteomyelitis of second toe of right foot (HCC)   . Lower extremity cellulitis 06/08/2018  . Hepatitis C antibody test positive 04/16/2018  . Osteomyelitis of toe of right foot (HCC) 04/10/2018  . Hyperlipidemia 04/08/2018  . Recurrent cellulitis of lower extremity 04/07/2018  . Chronic neuropathy of both feet-  due to lumbar radiculopathy as well as diabetic neuropathy 03/25/2018  . Pancytopenia (HCC) 03/15/2018  . DJD (degenerative joint disease) 01/09/2018  . Type 2 diabetes mellitus with complication, without long-term current use of insulin (HCC) 09/30/2017  . Essential hypertension 09/30/2017  . Mixed diabetic hyperlipidemia associated with type 2 diabetes mellitus (HCC) 09/30/2017  . Barrett's esophagus 12/07/2016  . Chronic venous stasis dermatitis of both lower extremities 05/08/2016  . Vitamin D deficiency 05/08/2016  . OSA on CPAP- seen Dr Sherene Sires in past 04/11/2016  .  Bilateral lower extremity pitting edema 04/11/2016  . Physical deconditioning 04/11/2016  . Restrictive lung disease secondary to obesity 04/11/2016  . History of tobacco use 04/11/2016  . h/o Depressive disorder 04/11/2016  . Lumbar radiculopathy 07/21/2012  . Chronic pain due to injury 07/21/2012  . Morbid obesity BMI >er 48 02/15/2012  . Postlaminectomy syndrome, lumbar region 01/04/2012  . h/o Narcotic abuse 01/04/2012  . Constipation 09/22/2011  . Chronic back pain 09/22/2011   Past Medical History:  Diagnosis Date  . Allergic rhinitis due to pollen   . Arthritis   . Asthma   . Back pain   .  Barrett esophagus   . Depressive disorder   . Diabetes mellitus type 2 in obese (HCC)   . Exposure to hepatitis B   . Exposure to hepatitis C   . Narcotic abuse (HCC)    pain medications  . OSA (obstructive sleep apnea)    on CPAP     Family History  Problem Relation Age of Onset  . Allergies Mother   . Early death Father   . Thyroid disease Sister   . Colon cancer Neg Hx   . Stomach cancer Neg Hx     Past Surgical History:  Procedure Laterality Date  . ABDOMINAL SURGERY    . AMPUTATION Right 06/11/2018   Procedure: RIGHT 2ND RAY AMPUTATION;  Surgeon: Nadara Mustarduda, Ryn Peine V, MD;  [facility];  Service: Orthopedics;  Laterality: Right;  . BACK SURGERY     Rods and screws in lumbar area  . COLONOSCOPY    . ESOPHAGOGASTRODUODENOSCOPY  multiple  . HERNIA REPAIR     6  . JOINT REPLACEMENT    . TOTAL KNEE ARTHROPLASTY  2009   left   Social History   Occupational History  . Occupation: Musician, Disabled  Tobacco Use  . Smoking status: Former Smoker    Packs/day: 0.80    Years: 15.00    Pack years: 12.00    Types: Cigarettes    Last attempt to quit: 07/16/2004    Years since quitting: 13.9  . Smokeless tobacco: Never Used  Substance and Sexual Activity  . Alcohol use: No  . Drug use: No    Comment: Prior abuse of narcotics  . Sexual activity: Never

## 2018-06-25 ENCOUNTER — Other Ambulatory Visit: Payer: Self-pay | Admitting: Family Medicine

## 2018-06-25 DIAGNOSIS — M159 Polyosteoarthritis, unspecified: Secondary | ICD-10-CM

## 2018-06-27 ENCOUNTER — Other Ambulatory Visit: Payer: Self-pay | Admitting: Family Medicine

## 2018-06-27 DIAGNOSIS — M159 Polyosteoarthritis, unspecified: Secondary | ICD-10-CM

## 2018-07-21 ENCOUNTER — Ambulatory Visit (INDEPENDENT_AMBULATORY_CARE_PROVIDER_SITE_OTHER): Payer: Medicare Other | Admitting: Podiatry

## 2018-07-21 ENCOUNTER — Encounter: Payer: Self-pay | Admitting: Podiatry

## 2018-07-21 VITALS — BP 115/63 | HR 73 | Resp 16

## 2018-07-21 DIAGNOSIS — B351 Tinea unguium: Secondary | ICD-10-CM

## 2018-07-21 DIAGNOSIS — Z89421 Acquired absence of other right toe(s): Secondary | ICD-10-CM

## 2018-07-21 DIAGNOSIS — E1142 Type 2 diabetes mellitus with diabetic polyneuropathy: Secondary | ICD-10-CM

## 2018-07-21 NOTE — Progress Notes (Signed)
Subjective:  Patient ID: Robert [name], male    DOB: [birth year],  MRN: [id number]  Chief Complaint  Patient presents with  . debride    BL nail trimming -FBS: 118  x mos A1C: 5.96 PCP: Opilski x Nov  . Procedure    Prev. R 2nd toe amp Pt. states," doing fine, no pain or problems at all." tx: none -pt denies N/V/F/CH    63 y.o. male presents  for diabetic foot care. Last AMBS was 118. Reports numbness and tingling in their feet. Denies cramping in legs and thighs.  Since last visit underwent amputation of the right 2nd toe for osteomyelitis.  Review of Systems: Negative except as noted in the HPI. Denies N/V/F/Ch.  Past Medical History:  Diagnosis Date  . Allergic rhinitis due to pollen   . Arthritis   . Asthma   . Back pain   . Barrett esophagus   . Depressive disorder   . Diabetes mellitus type 2 in obese ([place])   . Exposure to hepatitis B   . Exposure to hepatitis C   . Narcotic abuse (HCC)    pain medications  . OSA (obstructive sleep apnea)    on CPAP     Current Outpatient Medications:  .  albuterol (PROVENTIL HFA;VENTOLIN HFA) 108 (90 Base) MCG/ACT inhaler, Inhale 2 puffs into the lungs every 6 (six) hours as needed for wheezing or shortness of breath., Disp: , Rfl:  .  APPLE CIDER VINEGAR PO, Take 15 mLs by mouth 2 (two) times daily. , Disp: , Rfl:  .  atorvastatin (LIPITOR) 20 MG tablet, TAKE 1 TABLET BY MOUTH EVERYDAY AT BEDTIME (Patient taking differently: Take 20 mg by mouth daily at 6 PM. ), Disp: 90 tablet, Rfl: 0 .  B Complex Vitamins (B COMPLEX 100 PO), Take 1 capsule by mouth every other day. , Disp: , Rfl:  .  blood glucose meter kit and supplies KIT, Dispense based on patient and insurance preference. Use up to four times daily as directed. (FOR ICD-9 250.00, 250.01)., Disp: 1 each, Rfl: 0 .  blood glucose meter kit and supplies, Dispense based on patient and insurance preference. Use to check glucose fasting in the AM and then after largest meal of the  day.. (FOR ICD-10 E10.9, E11.9)., Disp: 1 each, Rfl: 0 .  Blood Glucose Monitoring Suppl (ONE TOUCH ULTRA MINI) w/Device KIT, Use to test blood sugar, test in the morning fasting and test after largest meal of the day., Disp: 1 each, Rfl: 0 .  butalbital-acetaminophen-caffeine (FIORICET, ESGIC) 50-325-40 MG tablet, Take 1 tablet by mouth every 6 (six) hours as needed for headache., Disp: 14 tablet, Rfl: 0 .  CALCIUM-MAGNESIUM-ZINC PO, Take 1 tablet by mouth every other day., Disp: , Rfl:  .  Cholecalciferol (VITAMIN D3) 5000 units TABS, 5,000 IU OTC vitamin D3 daily. (Patient taking differently: Take 5,000 Units by mouth daily. ), Disp: 90 tablet, Rfl: 3 .  cyclobenzaprine (FLEXERIL) 10 MG tablet, TAKE 1 TABLET BY MOUTH TWICE A DAY, Disp: 60 tablet, Rfl: 1 .  DANDELION PO, Take 1 tablet by mouth every other day., Disp: , Rfl:  .  docusate sodium (COLACE) 100 MG capsule, Take 1 capsule (100 mg total) by mouth 2 (two) times daily., Disp: 10 capsule, Rfl: 0 .  glucose blood (ONE TOUCH ULTRA TEST) test strip, Use to test blood sugar, test in the morning fasting and test after largest meal of the day., Disp: 100 each, Rfl: 11 .  HYDROcodone-acetaminophen (  NORCO/VICODIN) 5-325 MG tablet, Take 1 tablet by mouth every 6 (six) hours as needed for moderate pain (pain score 4-6)., Disp: 20 tablet, Rfl: 0 .  meloxicam (MOBIC) 15 MG tablet, TAKE 1 TABLET BY MOUTH EVERY DAY, Disp: 90 tablet, Rfl: 1 .  metFORMIN (GLUCOPHAGE) 500 MG tablet, TAKE 2 TABLETS (1,000 MG TOTAL) BY MOUTH 2 (TWO) TIMES DAILY WITH A MEAL., Disp: 360 tablet, Rfl: 1 .  mupirocin ointment (BACTROBAN) 2 %, Apply 1 application topically daily., Disp: , Rfl: 0 .  naphazoline-pheniramine (NAPHCON-A) 0.025-0.3 % ophthalmic solution, Place 1 drop into both eyes 4 (four) times daily as needed for eye irritation., Disp: , Rfl:  .  Olmesartan-amLODIPine-HCTZ 20-5-12.5 MG TABS, Take 1 tablet by mouth at bedtime. , Disp: , Rfl:  .  ONETOUCH DELICA LANCETS  43P MISC, Use for testing blood sugar, test once in the morning fasting and test after largest meal of the day., Disp: 100 each, Rfl: 11 .  Potassium 99 MG TABS, Take 99 mg by mouth every other day. , Disp: , Rfl:  .  traMADol (ULTRAM) 50 MG tablet, Take 2 tablets (100 mg total) by mouth every 6 (six) hours as needed. For pain, Disp: 30 tablet, Rfl:  .  TURMERIC CURCUMIN PO, Take 1 tablet by mouth every other day., Disp: , Rfl:  .  vitamin B-12 1000 MCG tablet, Take 1 tablet (1,000 mcg total) by mouth daily., Disp: 30 tablet, Rfl: 0 .  vitamin C (ASCORBIC ACID) 500 MG tablet, Take 500 mg by mouth daily., Disp: , Rfl:  .  Vitamin D, Ergocalciferol, (DRISDOL) 50000 units CAPS capsule, TAKE 1 CAPSULE WEEKLY (Patient taking differently: Take 50,000 Units by mouth every Friday. ), Disp: 12 capsule, Rfl: 4 .  VITAMIN E PO, Take 1 tablet by mouth daily., Disp: , Rfl:   Social History   Tobacco Use  Smoking Status Former Smoker  . Packs/day: 0.80  . Years: 15.00  . Pack years: 12.00  . Types: Cigarettes  . Last attempt to quit: 07/16/2004  . Years since quitting: 14.0  Smokeless Tobacco Never Used    No Known Allergies Objective:   Vitals:   07/21/18 1012  BP: 115/63  Pulse: 73  Resp: 16   There is no height or weight on file to calculate BMI. Constitutional Well developed. Well nourished.  Vascular Dorsalis pedis pulses present 1+ bilaterally  Posterior tibial pulses present 1+ bilaterally  Pedal hair growth absent. Capillary refill normal to all digits.  No cyanosis or clubbing noted.  Neurologic Normal speech. Oriented to person, place, and time. Epicritic sensation to light touch grossly present bilaterally. Protective sensation with 5.07 monofilament  absent bilaterally.  Dermatologic Nails elongated, thickened, dystrophic. No open wounds. No skin lesions.  Orthopedic: Normal joint ROM without pain or crepitus bilaterally. No visible deformities. No bony  tenderness. History of amputation right 2nd toe   Assessment:   1. DM type 2 with diabetic peripheral neuropathy ([place])   2. History of amputation of lesser toe, right ([place])   3. Onychomycosis    Plan:  Patient was evaluated and treated and all questions answered.  Diabetes with Amputation Hx, Onychomycosis -Educated on diabetic footcare. Diabetic risk level 3 -Nails x10 debrided sharply and manually with large nail nipper and rotary burr.   Procedure: Nail Debridement Rationale: Patient meets criteria for routine foot care due to Class A findings Type of Debridement: manual, sharp debridement. Instrumentation: Nail nipper, rotary burr. Number of Nails: 9  No  follow-ups on file.

## 2018-07-22 ENCOUNTER — Ambulatory Visit (INDEPENDENT_AMBULATORY_CARE_PROVIDER_SITE_OTHER): Payer: Medicare Other | Admitting: Orthopedic Surgery

## 2018-07-22 DIAGNOSIS — M545 Low back pain: Secondary | ICD-10-CM | POA: Diagnosis not present

## 2018-07-22 DIAGNOSIS — M47816 Spondylosis without myelopathy or radiculopathy, lumbar region: Secondary | ICD-10-CM | POA: Diagnosis not present

## 2018-07-22 DIAGNOSIS — G894 Chronic pain syndrome: Secondary | ICD-10-CM | POA: Diagnosis not present

## 2018-08-07 ENCOUNTER — Other Ambulatory Visit: Payer: Self-pay | Admitting: Family Medicine

## 2018-08-08 ENCOUNTER — Ambulatory Visit: Payer: Medicare Other | Admitting: Family Medicine

## 2018-08-19 ENCOUNTER — Other Ambulatory Visit: Payer: Self-pay | Admitting: Family Medicine

## 2018-08-19 DIAGNOSIS — M25569 Pain in unspecified knee: Secondary | ICD-10-CM | POA: Diagnosis not present

## 2018-08-19 DIAGNOSIS — M47816 Spondylosis without myelopathy or radiculopathy, lumbar region: Secondary | ICD-10-CM | POA: Diagnosis not present

## 2018-08-19 DIAGNOSIS — M159 Polyosteoarthritis, unspecified: Secondary | ICD-10-CM

## 2018-08-19 DIAGNOSIS — G894 Chronic pain syndrome: Secondary | ICD-10-CM | POA: Diagnosis not present

## 2018-08-21 ENCOUNTER — Ambulatory Visit (INDEPENDENT_AMBULATORY_CARE_PROVIDER_SITE_OTHER): Payer: Medicare Other | Admitting: Family Medicine

## 2018-08-21 ENCOUNTER — Encounter: Payer: Self-pay | Admitting: Family Medicine

## 2018-08-21 VITALS — BP 120/75 | HR 71 | Temp 98.2°F | Ht 74.0 in | Wt 342.0 lb

## 2018-08-21 DIAGNOSIS — S98131S Complete traumatic amputation of one right lesser toe, sequela: Secondary | ICD-10-CM

## 2018-08-21 DIAGNOSIS — E11621 Type 2 diabetes mellitus with foot ulcer: Secondary | ICD-10-CM | POA: Diagnosis not present

## 2018-08-21 DIAGNOSIS — I10 Essential (primary) hypertension: Secondary | ICD-10-CM

## 2018-08-21 DIAGNOSIS — E782 Mixed hyperlipidemia: Secondary | ICD-10-CM

## 2018-08-21 DIAGNOSIS — E118 Type 2 diabetes mellitus with unspecified complications: Secondary | ICD-10-CM | POA: Diagnosis not present

## 2018-08-21 DIAGNOSIS — E1169 Type 2 diabetes mellitus with other specified complication: Secondary | ICD-10-CM | POA: Diagnosis not present

## 2018-08-21 DIAGNOSIS — I152 Hypertension secondary to endocrine disorders: Secondary | ICD-10-CM

## 2018-08-21 DIAGNOSIS — E1159 Type 2 diabetes mellitus with other circulatory complications: Secondary | ICD-10-CM

## 2018-08-21 NOTE — Progress Notes (Signed)
Assessment and Plan:  1. Type 2 diabetes mellitus with complication, without long-term current use of insulin ([place])   2. Type 2 diabetes mellitus with foot ulcer (CODE) (HCC) Chronic  3. Morbid (severe) obesity due to excess calories (HCC) Chronic  4. Hypertension associated with diabetes ([place])   5. Mixed diabetic hyperlipidemia associated with type 2 diabetes mellitus ([place])   6. Morbid obesity BMI >er 48   7. Amputation of second toe, right, traumatic, sequela ([place])     1. Type 2 diabetes mellitus with foot ulcer (CODE) ([place]) -Amputation of right second toe by Dr. Sharol Given on November 27.  Patient has been doing great.  He had 2 follow-ups with Dr. Sharol Given who essentially released him. -Patient denies phantom limb pain or any complaints. -He has been getting around well and is really at his baseline ambulation status.  2. Morbid (severe) obesity due to excess calories [facility]) -Unfortunately patient and family struggles from a financial standpoint and eating healthy is difficult for patient.  We discussed strategies which may help him. -Patient knows he has to get back on his healthier eating habits and lose weight. -Again we discussed prudent diet and strategies to help him  3. Type 2 diabetes mellitus with complication, without long-term current use of insulin (HCC) Patient's A1c has been very well controlled over the past several times and most recently 5.9.  It is been consistently well controlled for the past year or so -Will recheck in 4 months from prior which would be around end of March  4. Hypertension associated with diabetes ([place]) Blood pressure under good control.  Patient remains on an ARB with amlodipine hydrochlorothiazide and very well controlled blood pressure. -Denies side effect will continue meds  5. Mixed diabetic hyperlipidemia associated with type 2 diabetes mellitus ([place]) Patient is on Lipitor with good control of his cholesterol levels.  Last checked in note  end of November 2019 with LDL at 97.  He has historically had low HDL and we discussed increasing activity if possible would improve that.  7. Amputation of second toe, right, traumatic, sequela ([place]) -Healing well as expected. -Recommend patient increase activity levels as tolerated and directed by his orthopedist Dr. Sharol Given    Extensive education and routine counseling performed. Handouts provided if pt desires.   -Reminded patient the need for yearly complete physical exam office visits in addition to office visits for management of the chronic diseases  -Gross side effects, risk and benefits, and alternatives of medications discussed with patient.  Patient is aware that all medications have potential side effects and we are unable to predict every side effect or drug-drug interaction that may occur.  Expresses verbal understanding and consents to current therapy plan and treatment regimen.    Follow Up:  Return for Follow-up end of March which should be 4 months from prior for A1c, blood pressure, diabetes, weight.   Please see AVS handed out to patient at the end of our visit for further patient instructions/ counseling done pertaining to today's office visit.    Note:  This document was prepared using Dragon voice recognition software and may include unintentional dictation errors.     ---------------------------------------------------------------------------------------------------------------------------------------------------------------------------  Subjective:  HPI: Robert [name] 63 y.o. male  presents for 3 month follow up for multiple medical problems.   HPI:  Robert [name] [id number] y.o. male presents for 3 month follow up for multiple medical problems.   1) Diabetes   Pt reports compliance with  medications and/ or treatment plan - metformin Denies S-E  Home fasting glucose readings range -    not checking Denies polyuria/polydipsia. Denies hypoglycemia  symptoms  Last diabetic eye exam was No results found for: HMDIABEYEEXA -Per patient he cannot afford this  Foot exam-  UTD per CMA  Lab Results  Component Value Date   HGBA1C 5.9 (H) 06/06/2018   HGBA1C 5.8 (H) 03/17/2018   HGBA1C 6.1 (A) 01/09/2018    Lab Results  Component Value Date   LDLCALC 97 06/06/2018   CREATININE 0.90 06/12/2018     2) Hyperlipidemia  Pt reports compliance with medications and/ or treatment plan- lipitor Denies S-E  RUQ pain-   -  Muscle aches-   - Patient reports poor compliance with low fat/low cholesterol diet and healthier lifestyle modifications.   Last lipid panel as follows:  Lab Results  Component Value Date   CHOL 162 06/06/2018   HDL 34 (L) 06/06/2018   LDLCALC 97 06/06/2018   TRIG 156 (H) 06/06/2018   CHOLHDL 4.8 06/06/2018         3) Hypertension  Pt reports compliance with medications and/ or treatment plan- on an ARB as well Denies S-E. Home Blood pressure range-not checking Low salt diet-    denies Exercise-    denies Smoking Status noted   Patient denies new onset of sx- no chest pain, dizziness, HA, DIB/ shortness of breath  or swelling. Lab Results  Component Value Date   CREATININE 0.90 06/12/2018   Last 3 blood pressure readings in our office are as follows: BP Readings from Last 3 Encounters:  08/21/18 120/75  07/21/18 115/63  06/12/18 (!) 107/56    The 10-year ASCVD risk score Mikey Bussing DC Jr., et al., 2013) is: 20.9%   Values used to calculate the score:     Age: 6 years     Sex: Male     Is Non-Hispanic African American: No     Diabetic: Yes     Tobacco smoker: No     Systolic Blood Pressure: 427 mmHg     Is BP treated: No     HDL Cholesterol: 34 mg/dL     Total Cholesterol: 162 mg/dL    4) Weight:   Wt Readings from Last 3 Encounters:  08/21/18 (!) 342 lb (155.1 kg)  06/24/18 (!) 345 lb 3.2 oz (156.6 kg)  06/18/18 (!) 345 lb 3.2 oz (156.6 kg)   BMI Readings from Last 3 Encounters:   08/21/18 43.91 kg/m  06/24/18 44.32 kg/m  06/18/18 44.32 kg/m       Patient Care Team    Relationship Specialty Notifications Start End  [name], DO PCP - General Family Medicine  02/20/16   Artist Pais, MD  Orthopedic Surgery  04/11/16   Dian Situ, MD  Pain Medicine  04/11/16   Tanda Rockers, MD Consulting Physician Pulmonary Disease  04/11/16    Comment: Obstructive sleep apnea, restrictive lung disease due to severe morbid obesity  Park Liter Diann, PA-C  Pain Medicine  07/23/17      Current Medications:  Current Outpatient Medications on File Prior to Visit  Medication Sig Dispense Refill  . albuterol (PROVENTIL HFA;VENTOLIN HFA) 108 (90 Base) MCG/ACT inhaler Inhale 2 puffs into the lungs every 6 (six) hours as needed for wheezing or shortness of breath.    . APPLE CIDER VINEGAR PO Take 15 mLs by mouth 2 (two) times daily.     Marland Kitchen atorvastatin (LIPITOR) 20 MG  tablet TAKE 1 TABLET BY MOUTH EVERYDAY AT BEDTIME (Patient taking differently: Take 20 mg by mouth daily at 6 PM. ) 90 tablet 0  . B Complex Vitamins (B COMPLEX 100 PO) Take 1 capsule by mouth every other day.     . blood glucose meter kit and supplies KIT Dispense based on patient and insurance preference. Use up to four times daily as directed. (FOR ICD-9 250.00, 250.01). 1 each 0  . blood glucose meter kit and supplies Dispense based on patient and insurance preference. Use to check glucose fasting in the AM and then after largest meal of the day.. (FOR ICD-10 E10.9, E11.9). 1 each 0  . Blood Glucose Monitoring Suppl (ONE TOUCH ULTRA MINI) w/Device KIT Use to test blood sugar, test in the morning fasting and test after largest meal of the day. 1 each 0  . CALCIUM-MAGNESIUM-ZINC PO Take 1 tablet by mouth every other day.    . Cholecalciferol (VITAMIN D3) 5000 units TABS 5,000 IU OTC vitamin D3 daily. (Patient taking differently: Take 5,000 Units by mouth daily. ) 90 tablet 3  . cyclobenzaprine  (FLEXERIL) 10 MG tablet TAKE 1 TABLET BY MOUTH TWICE A DAY 60 tablet 1  . DANDELION PO Take 1 tablet by mouth every other day.    Marland Kitchen glucose blood (ONE TOUCH ULTRA TEST) test strip Use to test blood sugar, test in the morning fasting and test after largest meal of the day. 100 each 11  . meloxicam (MOBIC) 15 MG tablet TAKE 1 TABLET BY MOUTH EVERY DAY 90 tablet 1  . metFORMIN (GLUCOPHAGE) 500 MG tablet TAKE 2 TABLETS (1,000 MG TOTAL) BY MOUTH 2 (TWO) TIMES DAILY WITH A MEAL. 360 tablet 1  . naphazoline-pheniramine (NAPHCON-A) 0.025-0.3 % ophthalmic solution Place 1 drop into both eyes 4 (four) times daily as needed for eye irritation.    . Olmesartan-amLODIPine-HCTZ 20-5-12.5 MG TABS Take 1 tablet by mouth at bedtime.     Glory Rosebush DELICA LANCETS 65H MISC Use for testing blood sugar, test once in the morning fasting and test after largest meal of the day. 100 each 11  . Potassium 99 MG TABS Take 99 mg by mouth every other day.     . traMADol (ULTRAM) 50 MG tablet Take 2 tablets (100 mg total) by mouth every 6 (six) hours as needed. For pain 30 tablet   . TURMERIC CURCUMIN PO Take 1 tablet by mouth every other day.    . vitamin B-12 1000 MCG tablet Take 1 tablet (1,000 mcg total) by mouth daily. 30 tablet 0  . vitamin C (ASCORBIC ACID) 500 MG tablet Take 500 mg by mouth daily.    . Vitamin D, Ergocalciferol, (DRISDOL) 1.25 MG (50000 UT) CAPS capsule Take 1 capsule (50,000 Units total) by mouth every Friday. 12 capsule 3  . VITAMIN E PO Take 1 tablet by mouth daily.     No current facility-administered medications on file prior to visit.     Medical History:  Patient Active Problem List   Diagnosis Date Noted  . Type 2 diabetes mellitus with complication, without long-term current use of insulin ([place]) 09/30/2017    Priority: High  . Hypertension associated with diabetes ([place]) 09/30/2017    Priority: High  . Mixed diabetic hyperlipidemia associated with type 2 diabetes mellitus ([place]) 09/30/2017     Priority: High  . Morbid (severe) obesity due to excess calories ([place]) 02/15/2012    Priority: High  . 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 Chronic venous stasis dermatitis of both lower extremities 05/08/2016    Priority: Medium  . OSA on CPAP- seen Dr Melvyn Novas in past 04/11/2016    Priority: Medium  . Restrictive lung disease secondary to obesity 04/11/2016    Priority: Medium  . h/o Depressive disorder 04/11/2016    Priority: Medium  . Vitamin D deficiency 05/08/2016    Priority: Low  . Physical deconditioning 04/11/2016    Priority: Low  . History of tobacco use 04/11/2016    Priority: Low  . Postlaminectomy syndrome, lumbar region 01/04/2012    Priority: Low  . h/o Narcotic abuse 01/04/2012    Priority: Low  . Constipation 09/22/2011    Priority: Low  . Chronic back pain 09/22/2011    Priority: Low  . Type 2 diabetes mellitus with foot ulcer (CODE) ([place]) 08/21/2018  . Amputation of second toe, right, traumatic, sequela ([place]) 08/21/2018  . Osteomyelitis of second toe of right foot ([place])   . Lower extremity cellulitis 06/08/2018  . Hepatitis C antibody test positive 04/16/2018  . Osteomyelitis of toe of right foot ([employer]) 04/10/2018  . Hyperlipidemia 04/08/2018  . Recurrent cellulitis of lower extremity 04/07/2018  . Pancytopenia ([place]) 03/15/2018  . DJD (degenerative joint disease) 01/09/2018  . Barrett's esophagus 12/07/2016  . Bilateral lower extremity pitting edema 04/11/2016  . Lumbar radiculopathy 07/21/2012  . Chronic pain due to injury 07/21/2012    Allergies:  No Known Allergies   Family history-  Reviewed; changed as appropriate  Social history-  Reviewed; changed as appropriate    Review of Systems: A fourteen system review of systems was performed and found to be positive as per HPI.   Objective: Physical Exam: BP 120/75   Pulse 71   Temp 98.2 F (36.8  C)   Ht _0  (1.88 m)   Wt (!) 342 lb (155.1 kg)   SpO2 97%   BMI 43.91 kg/m  Body mass index is 43.91 kg/m. General: Well nourished, in no apparent distress. Eyes: PERRLA, EOMs, conjunctiva clr no swelling or erythema ENT/Mouth: Hearing appears normal.  Mucus Membranes Moist  Neck: Supple, no masses, no carotid bruits b/l Resp: Respiratory effort- normal, ECTA B/L w/o W/R/R  Cardio: RRR,  + S1, S2, no M Abdomen: no gross distention. Lymphatics:  Brisk peripheral pulses, less 2 sec cap RF, no gross edema  M-sk: Full ROM, 5/5 strength, normal gait.  Skin: Warm, dry without rashes, lesions, ecchymosis.  Skin over recent surgical site appears pink, not swollen and very well-healed.  No hyperalgesia present Neuro: Alert, Oriented Psych: Normal affect, Insight and Judgment appropriate.

## 2018-08-28 ENCOUNTER — Telehealth: Payer: Self-pay | Admitting: Family Medicine

## 2018-08-28 DIAGNOSIS — H9193 Unspecified hearing loss, bilateral: Secondary | ICD-10-CM

## 2018-08-28 NOTE — Telephone Encounter (Signed)
Pt called back to says he did have 2019 eye exam because he has new glasses---forwarding message to medical assistant. --Robert [name]

## 2018-08-28 NOTE — Telephone Encounter (Signed)
Referral placed for audiology. MPulliam, CMA/RT(R)

## 2018-08-28 NOTE — Telephone Encounter (Signed)
Patient left message on office voicemail requesting provider refer him for a Hearing test--- Forwarding message to medical assistant for review .   --glh

## 2018-09-11 ENCOUNTER — Encounter: Payer: Self-pay | Admitting: Family Medicine

## 2018-09-11 ENCOUNTER — Other Ambulatory Visit: Payer: Self-pay

## 2018-09-11 ENCOUNTER — Ambulatory Visit (INDEPENDENT_AMBULATORY_CARE_PROVIDER_SITE_OTHER): Payer: Medicare Other | Admitting: Family Medicine

## 2018-09-11 VITALS — BP 111/62 | HR 69 | Temp 98.7°F | Ht 74.0 in | Wt 345.0 lb

## 2018-09-11 DIAGNOSIS — H1032 Unspecified acute conjunctivitis, left eye: Secondary | ICD-10-CM

## 2018-09-11 DIAGNOSIS — Z0100 Encounter for examination of eyes and vision without abnormal findings: Secondary | ICD-10-CM

## 2018-09-11 DIAGNOSIS — B301 Conjunctivitis due to adenovirus: Secondary | ICD-10-CM | POA: Insufficient documentation

## 2018-09-11 MED ORDER — OLOPATADINE HCL 0.1 % OP SOLN: 1.0000 [drp] | Freq: Two times a day (BID) | OPHTHALMIC | 11 refills | Status: DC

## 2018-09-11 NOTE — Telephone Encounter (Signed)
Patient requesting a refill on albuterol inhaler, last filled by a previous provider. LOV 08/10/2018. Please review and advise. MPulliam, CMA/RT(R)
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
## 2018-09-11 NOTE — Patient Instructions (Signed)
If the medicine we sent into your pharmacy is too cost prohibitive, please get Naphcon-A over-the-counter and use as directed  -If the eye does not continue to slowly improve with this treatment, please notify us as you might need to be reevaluated as I would not anticipate this to occurr

## 2018-09-12 MED ORDER — ALBUTEROL SULFATE HFA 108 (90 BASE) MCG/ACT IN AERS: 1.0000 | INHALATION_SPRAY | Freq: Four times a day (QID) | RESPIRATORY_TRACT | 0 refills | Status: DC | PRN

## 2018-09-15 ENCOUNTER — Ambulatory Visit: Payer: Medicare Other | Admitting: Family Medicine

## 2018-09-16 DIAGNOSIS — M25569 Pain in unspecified knee: Secondary | ICD-10-CM | POA: Diagnosis not present

## 2018-09-16 DIAGNOSIS — G894 Chronic pain syndrome: Secondary | ICD-10-CM | POA: Diagnosis not present

## 2018-09-16 DIAGNOSIS — M47816 Spondylosis without myelopathy or radiculopathy, lumbar region: Secondary | ICD-10-CM | POA: Diagnosis not present

## 2018-09-30 ENCOUNTER — Other Ambulatory Visit: Payer: Self-pay | Admitting: Family Medicine

## 2018-10-07 ENCOUNTER — Ambulatory Visit: Payer: Medicare Other | Admitting: Family Medicine

## 2018-10-07 ENCOUNTER — Telehealth: Payer: Self-pay | Admitting: Family Medicine

## 2018-10-07 ENCOUNTER — Other Ambulatory Visit: Payer: Self-pay

## 2018-10-07 DIAGNOSIS — I152 Hypertension secondary to endocrine disorders: Secondary | ICD-10-CM

## 2018-10-07 DIAGNOSIS — I10 Essential (primary) hypertension: Principal | ICD-10-CM

## 2018-10-07 DIAGNOSIS — E1159 Type 2 diabetes mellitus with other circulatory complications: Secondary | ICD-10-CM

## 2018-10-07 MED ORDER — OLMESARTAN-AMLODIPINE-HCTZ 20-5-12.5 MG PO TABS: 1.0000 | ORAL_TABLET | Freq: Every day | ORAL | 0 refills | Status: DC

## 2018-10-07 NOTE — Telephone Encounter (Signed)
Pt cld states he received a letter from [employer] stating that the do not cover this particular medication :  (Provider can submit appeal reasoning for approval Phone#[phone]-- fax#[phone]  Olmesartan-amLODIPine-HCTZ 20-5-12.5 MG TABS [[id number]]   Order Details  Dose: 1 tablet Route: Oral Frequency: Daily at bedtime  Dispense Quantity: -- Refills: -- Fills remaining: --        Sig: Take 1 tablet by mouth at bedtime.        Written Date: -- Expiration Date: -- Ordering Date: 04/07/18   Start Date: -- End Date: --         Ordering Provider: -- DEA #:  -- NPI:  --   Authorizing Provider: [provider] DEA #:  -- NPI:  [id number]   Ordering User:  Waymond Cera, CPhT            Pharmacy:  [id number] [place], [place] - [address] DEA #:  [id number]    Pharmacy Comments:  --     ---Forwarding message to medical assistant for review, I do not see that Dr. Sharee Holster prescribes this Rx but patient states she started him on it &her name is on the Rx bottle.  --Call patient @ [phone] if questions or concerns.  --glh  --

## 2018-10-07 NOTE — Telephone Encounter (Signed)
Patient called requesting a refill on Olmesartan-Amlodipine-HCTZ.  Reviewed all recent notes in the chart and per lov patient is to cont on this medication.  Refill sent in per office policy. MPulliam, CMA/RT(R)

## 2018-10-07 NOTE — Telephone Encounter (Signed)
Sent in refill after review of chart.  Patient notified. MPulliam, CMA/RT(R)

## 2018-10-07 NOTE — Telephone Encounter (Signed)
Started PA - patient notified. MPulliam, CMA/RT(R)

## 2018-10-14 ENCOUNTER — Encounter: Payer: Self-pay | Admitting: Family Medicine

## 2018-10-14 ENCOUNTER — Ambulatory Visit (INDEPENDENT_AMBULATORY_CARE_PROVIDER_SITE_OTHER): Payer: Medicare Other | Admitting: Family Medicine

## 2018-10-14 ENCOUNTER — Other Ambulatory Visit: Payer: Self-pay | Admitting: Family Medicine

## 2018-10-14 ENCOUNTER — Other Ambulatory Visit: Payer: Self-pay

## 2018-10-14 VITALS — Temp 97.1°F | Wt 345.0 lb

## 2018-10-14 DIAGNOSIS — E1159 Type 2 diabetes mellitus with other circulatory complications: Secondary | ICD-10-CM

## 2018-10-14 DIAGNOSIS — R431 Parosmia: Secondary | ICD-10-CM | POA: Diagnosis not present

## 2018-10-14 DIAGNOSIS — I10 Essential (primary) hypertension: Secondary | ICD-10-CM | POA: Diagnosis not present

## 2018-10-14 DIAGNOSIS — M159 Polyosteoarthritis, unspecified: Secondary | ICD-10-CM

## 2018-10-14 DIAGNOSIS — E118 Type 2 diabetes mellitus with unspecified complications: Secondary | ICD-10-CM

## 2018-10-14 DIAGNOSIS — I152 Hypertension secondary to endocrine disorders: Secondary | ICD-10-CM

## 2018-10-14 NOTE — Progress Notes (Signed)
Virtual Visit via Telephone Note for [employer], D.O- Primary Care Physician at [facility]   I connected with current patient today by telephone and verified that I am speaking with the correct person using two identifiers.   I discussed the limitations, risks, security and privacy concerns of performing an evaluation and management service by telephone and the limited availability of in person appointments during this current national crisis of the Covid-19 pandemic.  My staff members also discussed with the patient that there may be a patient responsible charge related to this service.  The patient expressed understanding and agreed to proceed.     History of Present Illness:   Doesn't have a means to check BP, P or wt etc. Was unable to obtain any vitals at home today.   Pt has concerns about abn smelling sensations lately-->  Everything smells like smoke-comes and goes though..  Patient is worried about sinus infection, convinced he has 1.    NO F/C, no one-sided face pain, no one sided ear pain.  no ST, cough, no PND/ drainage other than his normal.     Although he typically does get sinus congestion due to allergies in the spring, he tells me he does not have any of the symptoms yet.  He is not taking anything for his seasonal allergies.   Hypertension assoc w DM: Not sure of how blood pressure is running at home as he does not check.  Patient has no symptoms of headache, dizziness, visual changes, chest pain or tightness, shortness of breath or any other acute symptoms at this time.        Patient tells me today he needs to change BP meds- "got letter from his insurance stating that he needs to change meds".    He wanted to discuss this with me today.   - Told patient that looking for the notes it appears call was made 3/24 of 2020 to address this.  Notes state that a prior authorization was done by [employer].   - I called Melissa today during my office visit and she told me  that this was approved by his insurance.  Patient notified he does not need to change medications at this time.   He denied need of refills.   DM-  Last A1c 6.1, then 5.8, and then 5.9 most recently.  Patient has not been checking his blood sugars much, but is taking medication.  He denies any polydipsia, polyuria, dizziness or lightheadedness, nausea or abdominal pain etc.      Wt Readings from Last 3 Encounters:  10/14/18 (!) 345 lb (156.5 kg)  09/11/18 (!) 345 lb (156.5 kg)  08/21/18 (!) 342 lb (155.1 kg)   BP Readings from Last 3 Encounters:  09/11/18 111/62  08/21/18 120/75  07/21/18 115/63   Pulse Readings from Last 3 Encounters:  09/11/18 69  08/21/18 71  07/21/18 73   BMI Readings from Last 3 Encounters:  10/14/18 44.30 kg/m  09/11/18 44.30 kg/m  08/21/18 43.91 kg/m     Patient Care Team    Relationship Specialty Notifications Start End  Thomasene Lot, DO PCP - General Family Medicine  02/20/16   Rachelle Hora, MD  Orthopedic Surgery  04/11/16   Ardell Isaacs, MD  Pain Medicine  04/11/16   Nyoka Cowden, MD Consulting Physician Pulmonary Disease  04/11/16    Comment: Obstructive sleep apnea, restrictive lung disease due to severe morbid obesity  Romelle Starcher, PA-C  Pain Medicine  07/23/17   Nadara Mustard, MD Consulting Physician Orthopedic Surgery  08/22/18      Patient Active Problem List   Diagnosis Date Noted  . Type 2 diabetes mellitus with complication, without long-term current use of insulin (HCC) 09/30/2017    Priority: High  . Hypertension associated with diabetes (HCC) 09/30/2017    Priority: High  . Mixed diabetic hyperlipidemia associated with type 2 diabetes mellitus (HCC) 09/30/2017    Priority: High  . Morbid (severe) obesity due to excess calories (HCC) 02/15/2012    Priority: High  . 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 Chronic venous  stasis dermatitis of both lower extremities 05/08/2016    Priority: Medium  . OSA on CPAP- seen Dr Sherene Sires in past 04/11/2016    Priority: Medium  . Restrictive lung disease secondary to obesity 04/11/2016    Priority: Medium  . h/o Depressive disorder 04/11/2016    Priority: Medium  . Vitamin D deficiency 05/08/2016    Priority: Low  . Physical deconditioning 04/11/2016    Priority: Low  . History of tobacco use 04/11/2016    Priority: Low  . Postlaminectomy syndrome, lumbar region 01/04/2012    Priority: Low  . h/o Narcotic abuse 01/04/2012    Priority: Low  . Constipation 09/22/2011    Priority: Low  . Chronic back pain 09/22/2011    Priority: Low  . Conjunctivitis due to adenovirus, left eye 09/11/2018  . Type 2 diabetes mellitus with foot ulcer (CODE) (HCC) 08/21/2018  . Amputation of second toe, right, traumatic, sequela (HCC) 08/21/2018  . Osteomyelitis of second toe of right foot (HCC)   . Lower extremity cellulitis 06/08/2018  . Hepatitis C antibody test positive 04/16/2018  . Osteomyelitis of toe of right foot (HCC) 04/10/2018  . Hyperlipidemia 04/08/2018  . Recurrent cellulitis of lower extremity 04/07/2018  . Pancytopenia (HCC) 03/15/2018  . DJD (degenerative joint disease) 01/09/2018  . Barrett's esophagus 12/07/2016  . Bilateral lower extremity pitting edema 04/11/2016  . Lumbar radiculopathy 07/21/2012  . Chronic pain due to injury 07/21/2012     Current Meds  Medication Sig  . atorvastatin (LIPITOR) 20 MG tablet TAKE 1 TABLET BY MOUTH EVERYDAY AT BEDTIME (Patient taking differently: Take 20 mg by mouth daily at 6 PM. )  . Cholecalciferol (VITAMIN D3) 5000 units TABS 5,000 IU OTC vitamin D3 daily. (Patient taking differently: Take 5,000 Units by mouth daily. )  . cyclobenzaprine (FLEXERIL) 10 MG tablet TAKE 1 TABLET BY MOUTH TWICE A DAY  . meloxicam (MOBIC) 15 MG tablet TAKE 1 TABLET BY MOUTH EVERY DAY  . metFORMIN (GLUCOPHAGE) 500 MG tablet TAKE 2 TABLETS  (1,000 MG TOTAL) BY MOUTH 2 (TWO) TIMES DAILY WITH A MEAL.  . Olmesartan-amLODIPine-HCTZ 20-5-12.5 MG TABS Take 1 tablet by mouth at bedtime.  . Potassium 99 MG TABS Take 99 mg by mouth every other day.   . Vitamin D, Ergocalciferol, (DRISDOL) 1.25 MG (50000 UT) CAPS capsule Take 1 capsule (50,000 Units total) by mouth every Friday.     Allergies:  No Known Allergies   ROS:  See above HPI for pertinent positives and negatives   Objective:   Temperature (!) 97.1 F (36.2 C), weight (!) 345 lb (156.5 kg). Body mass index is 44.3 kg/m.   General: sounds in no acute distress.  Skin: Pt confirms warm and dry  extremities and pink fingertips Respiratory: speaking in full  sentences, no conversational dyspnea Psych: A and O *3, appears insight good, mood- full      Impression and Recommendations:    1. Abnormal smell- acute Told patient this is very unlikely due to sinus infection because he has no's signs or symptoms of it. -Recommended he do AYR or Lloyd Huger med sinus rinses twice daily with 1 spray Flonase each nostril after the rinse twice daily -Discussed preventative strategies and avoiding going outside during high pollen counts etc.  2. Hypertension associated with diabetes (HCC) -Advised patient to please check his blood pressure at home-since he does not have a method, advised Omron 3 series is a good basic blood pressure cuff for his upper arm that he can get for around $25. -Importance of home monitoring discussed with patient. -Further discuss how increasing BMI increases blood pressure and blood sugar etc. -Discussed with patient DASH diet as well as other dietary and lifestyle modifications to help control as well.  3. Type 2 diabetes mellitus with complication, without long-term current use of insulin (HCC) -Patient not checking blood sugars at home and highly encouraged to do so. -A1c was very well controlled and at goal last couple of times checked - so, no reason to  bring high risk patient into the office during this cove [place] 19 outbreak. -Discussed dietary and lifestyle modifications in addition to weight loss that can help control his diabetes better. -Since cost of medications is a large concern for patient, he would not be a GLP-1 candidate, as well as A1c has been under 6.  4.  Morbidly Obese:  -Counseling done regarding worsening disease associated with increasing BMI especially when over 40+. -Encouraged healthier habits and patient to walk more.   I discussed the assessment and treatment plan with the patient. The patient was provided an opportunity to ask questions and all were answered. The patient agreed with the plan and demonstrated an understanding of the instructions.   The patient was advised to call back or seek an in-person evaluation if the symptoms worsen or if the condition fails to improve as anticipated.   Expresses verbal understanding and consents to current therapy and treatment regimen.  No barriers to understanding were identified.  Red flag symptoms and signs discussed in detail.  Patient expressed understanding regarding what to do in case of emergency\urgent symptoms  Please see AVS handed out to patient at the end of our visit for further patient instructions/ counseling done pertaining to today's office visit.  I provided 23+ minutes of non-face-to-face time during this encounter.     Thomasene Lot, DO

## 2018-10-20 ENCOUNTER — Ambulatory Visit: Payer: Medicare Other | Admitting: Podiatry

## 2018-11-07 ENCOUNTER — Other Ambulatory Visit: Payer: Self-pay | Admitting: Family Medicine

## 2018-11-07 DIAGNOSIS — E118 Type 2 diabetes mellitus with unspecified complications: Secondary | ICD-10-CM

## 2018-11-21 DIAGNOSIS — Z79899 Other long term (current) drug therapy: Secondary | ICD-10-CM | POA: Diagnosis not present

## 2018-11-21 DIAGNOSIS — Z79891 Long term (current) use of opiate analgesic: Secondary | ICD-10-CM | POA: Diagnosis not present

## 2018-11-21 DIAGNOSIS — M25569 Pain in unspecified knee: Secondary | ICD-10-CM | POA: Diagnosis not present

## 2018-11-21 DIAGNOSIS — M545 Low back pain: Secondary | ICD-10-CM | POA: Diagnosis not present

## 2018-11-21 DIAGNOSIS — M47816 Spondylosis without myelopathy or radiculopathy, lumbar region: Secondary | ICD-10-CM | POA: Diagnosis not present

## 2018-11-21 DIAGNOSIS — G894 Chronic pain syndrome: Secondary | ICD-10-CM | POA: Diagnosis not present

## 2018-12-03 ENCOUNTER — Encounter: Payer: Self-pay | Admitting: Family Medicine

## 2018-12-03 ENCOUNTER — Other Ambulatory Visit: Payer: Self-pay

## 2018-12-03 ENCOUNTER — Ambulatory Visit (INDEPENDENT_AMBULATORY_CARE_PROVIDER_SITE_OTHER): Payer: Medicare Other | Admitting: Family Medicine

## 2018-12-03 VITALS — Ht 74.0 in | Wt 350.0 lb

## 2018-12-03 DIAGNOSIS — L918 Other hypertrophic disorders of the skin: Secondary | ICD-10-CM

## 2018-12-03 MED ORDER — MUPIROCIN CALCIUM 2 % NA OINT: 1.0000 "application " | TOPICAL_OINTMENT | Freq: Two times a day (BID) | NASAL | 0 refills | Status: DC

## 2018-12-03 NOTE — Progress Notes (Signed)
Virtual / live video office visit note for Robert [name], D.O- Primary Care Physician at [facility]   I connected with current patient today and beyond visually recognizing the correct individual, I verified that I am speaking with the correct person using two identifiers.  . Location of the patient: Home . Location of the provider: Office Only the patient (+/- their family members at pt's discretion) and myself were participating in the encounter    - This visit type was conducted due to national recommendations for restrictions regarding the COVID-19 Pandemic (e.g. social distancing) in an effort to limit this patient's exposure and mitigate transmission in our community.  This format is felt to be most appropriate for this patient at this time.   - The patient did have access to video technology today   - No physical exam could be performed with this format, beyond that communicated to [place] by the patient/ family members as noted.   - Additionally my office staff/ schedulers discussed with the patient that there may be a monetary charge related to this service, depending on patient's medical insurance.   The patient expressed understanding, and agreed to proceed.      History of Present Illness:  Skin tag L axilla- had it his whole life. He noticed it 3 nights ago while showering- he nicked it while showering with his fingernails.  Last night noticed it getting black and redness around base.   And a little tender to touch.  He has been wearing a shirt and not avoiding friction on it.  He says at the end of the day after he is moving around and wearing his shirt for a while it is a little worse.  He has not done anything for it.   Impression and Recommendations:    1. Acquired skin tag-currently inflamed, left axilla    -Although it does not appear to be infected, because of the redness around the base and his history of diabetes with frequent infections, we will be safe than sorry  and he will use mupirocin ointment on it twice daily. -Stressed importance of avoiding all friction.  Since it is on his upper arm that should be easier to have a Band-Aid on it at all times.  If he is laying around he should keep it open to air and arms raised above his head avoiding all friction, T-shirts and shirts etc. -If it does not continue to slowly improve or worsens with this proper treatment, he will let us know. -He knows to keep all his regular scheduled appointment for his diabetes and multiple medical problems  - As part of my medical decision making, I reviewed the following data within the [place] History obtained from pt /family, CMA notes reviewed and incorporated if applicable, Labs reviewed, Radiograph/ tests reviewed if applicable and OV notes from prior OV's with me, as well as other specialists she/he has seen since seeing me last, were all reviewed and used in my medical decision making process today.   - Additionally, discussion had with patient regarding txmnt plan, their biases about that plan etc were used in my medical decision making today.   - The patient agreed with the plan and demonstrated an understanding of the instructions.   No barriers to understanding were identified.   - Red flag symptoms and signs discussed in detail.  Patient expressed understanding regarding what to do in case of emergency\ urgent symptoms.  The patient  was advised to call back or seek an in-person evaluation if the symptoms worsen or if the condition fails to improve as anticipated.   Return if symptoms worsen or fail to improve, for He will keep his chronic diabetes etc. follow-ups as previously scheduled.    No orders of the defined types were placed in this encounter.   Meds ordered this encounter  Medications  . mupirocin nasal ointment (BACTROBAN) 2 %    Sig: Place 1 application into the nose 2 (two) times daily. Apply to both nares BID for 5 days.     Dispense:  30 g    Refill:  0    There are no discontinued medications.    I provided 16 minutes of non-face-to-face time during this encounter,with over 50% of the time in direct counseling on patients medical conditions/ medical concerns.  Additional time was spent with charting and coordination of care after the actual visit commenced.   Note:  This note was prepared with assistance of Dragon voice recognition software. Occasional wrong-word or sound-a-like substitutions may have occurred due to the inherent limitations of voice recognition software.  [name], DO     Patient Care Team    Relationship Specialty Notifications Start End  [name], DO PCP - General Family Medicine  02/20/16   Daubert, Marlise Eves, MD  Orthopedic Surgery  04/11/16   Dian Situ, MD  Pain Medicine  04/11/16   Tanda Rockers, MD Consulting Physician Pulmonary Disease  04/11/16    Comment: Obstructive sleep apnea, restrictive lung disease due to severe morbid obesity  Awilda Metro, PA-C  Pain Medicine  07/23/17   Newt Minion, MD Consulting Physician Orthopedic Surgery  08/22/18     -Vitals obtained; medications/ allergies reconciled;  personal medical, social, Sx etc.histories were updated by CMA, reviewed by me and are reflected in chart  Patient Active Problem List   Diagnosis Date Noted  . Type 2 diabetes mellitus with complication, without long-term current use of insulin ([place]) 09/30/2017    Priority: High  . Hypertension associated with diabetes ([place]) 09/30/2017    Priority: High  . Mixed diabetic hyperlipidemia associated with type 2 diabetes mellitus ([place]) 09/30/2017    Priority: High  . Morbid (severe) obesity due to excess calories ([place]) 02/15/2012    Priority: High  . 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 Chronic venous stasis dermatitis of both lower extremities 05/08/2016     Priority: Medium  . OSA on CPAP- seen Dr Melvyn Novas in past 04/11/2016    Priority: Medium  . Restrictive lung disease secondary to obesity 04/11/2016    Priority: Medium  . h/o Depressive disorder 04/11/2016    Priority: Medium  . Vitamin D deficiency 05/08/2016    Priority: Low  . Physical deconditioning 04/11/2016    Priority: Low  . History of tobacco use 04/11/2016    Priority: Low  . Postlaminectomy syndrome, lumbar region 01/04/2012    Priority: Low  . h/o Narcotic abuse 01/04/2012    Priority: Low  . Constipation 09/22/2011    Priority: Low  . Chronic back pain 09/22/2011    Priority: Low  . Conjunctivitis due to adenovirus, left eye 09/11/2018  . Type 2 diabetes mellitus with foot ulcer (CODE) ([place]) 08/21/2018  . Amputation of second toe, right, traumatic, sequela ([place]) 08/21/2018  . Osteomyelitis of second toe of right foot ([place])   .  Lower extremity cellulitis 06/08/2018  . Hepatitis C antibody test positive 04/16/2018  . Osteomyelitis of toe of right foot ([place]) 04/10/2018  . Hyperlipidemia 04/08/2018  . Recurrent cellulitis of lower extremity 04/07/2018  . Pancytopenia ([place]) 03/15/2018  . DJD (degenerative joint disease) 01/09/2018  . Barrett's esophagus 12/07/2016  . Bilateral lower extremity pitting edema 04/11/2016  . Lumbar radiculopathy 07/21/2012  . Chronic pain due to injury 07/21/2012     Current Meds  Medication Sig  . APPLE CIDER VINEGAR PO Take 15 mLs by mouth 2 (two) times daily.   Marland Kitchen atorvastatin (LIPITOR) 20 MG tablet TAKE 1 TABLET BY MOUTH EVERYDAY AT BEDTIME (Patient taking differently: Take 20 mg by mouth daily at 6 PM. )  . B Complex Vitamins (B COMPLEX 100 PO) Take 1 capsule by mouth every other day.   . blood glucose meter kit and supplies KIT Dispense based on patient and insurance preference. Use up to four times daily as directed. (FOR ICD-9 250.00, 250.01).  . blood glucose meter kit and supplies Dispense based on patient and insurance  preference. Use to check glucose fasting in the AM and then after largest meal of the day.. (FOR ICD-10 E10.9, E11.9).  Marland Kitchen Blood Glucose Monitoring Suppl (ONE TOUCH ULTRA MINI) w/Device KIT Use to test blood sugar, test in the morning fasting and test after largest meal of the day.  Marland Kitchen CALCIUM-MAGNESIUM-ZINC PO Take 1 tablet by mouth every other day.  . Cholecalciferol (VITAMIN D3) 5000 units TABS 5,000 IU OTC vitamin D3 daily. (Patient taking differently: Take 5,000 Units by mouth daily. )  . cyclobenzaprine (FLEXERIL) 10 MG tablet TAKE 1 TABLET BY MOUTH TWICE A DAY  . DANDELION PO Take 1 tablet by mouth every other day.  Marland Kitchen glucose blood (ONE TOUCH ULTRA TEST) test strip Use to test blood sugar, test in the morning fasting and test after largest meal of the day.  . meloxicam (MOBIC) 15 MG tablet TAKE 1 TABLET BY MOUTH EVERY DAY  . metFORMIN (GLUCOPHAGE) 500 MG tablet TAKE 2 TABLETS (1,000 MG TOTAL) BY MOUTH 2 (TWO) TIMES DAILY WITH A MEAL.  . Olmesartan-amLODIPine-HCTZ 20-5-12.5 MG TABS Take 1 tablet by mouth at bedtime.  Marland Kitchen olopatadine (PATANOL) 0.1 % ophthalmic solution Place 1 drop into both eyes 2 (two) times daily.  Glory Rosebush DELICA LANCETS 38H MISC Use for testing blood sugar, test once in the morning fasting and test after largest meal of the day.  . Potassium 99 MG TABS Take 99 mg by mouth every other day.   . traMADol (ULTRAM) 50 MG tablet Take 2 tablets (100 mg total) by mouth every 6 (six) hours as needed. For pain  . TURMERIC CURCUMIN PO Take 1 tablet by mouth every other day.  . VENTOLIN HFA 108 (90 Base) MCG/ACT inhaler INHALE 1-2 PUFFS INTO THE LUNGS EVERY 6 (SIX) HOURS AS NEEDED FOR WHEEZING OR SHORTNESS OF BREATH.  . vitamin B-12 1000 MCG tablet Take 1 tablet (1,000 mcg total) by mouth daily.  . vitamin C (ASCORBIC ACID) 500 MG tablet Take 500 mg by mouth daily.  . Vitamin D, Ergocalciferol, (DRISDOL) 1.25 MG (50000 UT) CAPS capsule Take 1 capsule (50,000 Units total) by mouth every  Friday.  Marland Kitchen VITAMIN E PO Take 1 tablet by mouth daily.     No Known Allergies   ROS:  See above HPI for pertinent positives and negatives   Objective:   Height '6\' 2"'$  (1.88 m), weight (!) 350 lb (158.8 kg).  (  if some vitals are omitted, this means that patient was UNABLE to obtain them even though they were asked to get them prior to [place] today.  They were asked to call us at their earliest convenience with these once obtained.)  General: A & O * 3; visually in no acute distress; in usual state of health.  Skin: Visible skin appears normal and pt's usual skin color.  a 2 mm at the base skin tag on the upper arm portion of his left axilla.  It appears to have a small hematoma that is deoxygenated blood at the head of it and the base has mild erythema appearing like it is due to irritation.  No surrounding erythema.  No increased warmth per patient.  Of note he has 2 other skin tags in the axilla and the medial aspect HEENT:  EOMI, head is normocephalic and atraumatic.  Sclera are anicteric. Neck has a good range of motion.  Lips are noncyanotic Chest: normal chest excursion and movement Respiratory: speaking in full sentences, no conversational dyspnea; no use of accessory muscles Psych: insight good, mood- appears full

## 2018-12-11 ENCOUNTER — Other Ambulatory Visit: Payer: Self-pay | Admitting: Family Medicine

## 2018-12-11 DIAGNOSIS — M159 Polyosteoarthritis, unspecified: Secondary | ICD-10-CM

## 2018-12-19 DIAGNOSIS — M47816 Spondylosis without myelopathy or radiculopathy, lumbar region: Secondary | ICD-10-CM | POA: Diagnosis not present

## 2018-12-19 DIAGNOSIS — M545 Low back pain: Secondary | ICD-10-CM | POA: Diagnosis not present

## 2018-12-19 DIAGNOSIS — M25569 Pain in unspecified knee: Secondary | ICD-10-CM | POA: Diagnosis not present

## 2018-12-19 DIAGNOSIS — G894 Chronic pain syndrome: Secondary | ICD-10-CM | POA: Diagnosis not present

## 2018-12-23 ENCOUNTER — Other Ambulatory Visit: Payer: Self-pay | Admitting: Family Medicine

## 2018-12-23 DIAGNOSIS — M159 Polyosteoarthritis, unspecified: Secondary | ICD-10-CM

## 2018-12-31 DIAGNOSIS — M546 Pain in thoracic spine: Secondary | ICD-10-CM | POA: Diagnosis not present

## 2018-12-31 DIAGNOSIS — M9905 Segmental and somatic dysfunction of pelvic region: Secondary | ICD-10-CM | POA: Diagnosis not present

## 2018-12-31 DIAGNOSIS — M461 Sacroiliitis, not elsewhere classified: Secondary | ICD-10-CM | POA: Diagnosis not present

## 2018-12-31 DIAGNOSIS — M9904 Segmental and somatic dysfunction of sacral region: Secondary | ICD-10-CM | POA: Diagnosis not present

## 2018-12-31 DIAGNOSIS — M5116 Intervertebral disc disorders with radiculopathy, lumbar region: Secondary | ICD-10-CM | POA: Diagnosis not present

## 2018-12-31 DIAGNOSIS — M542 Cervicalgia: Secondary | ICD-10-CM | POA: Diagnosis not present

## 2018-12-31 DIAGNOSIS — M9902 Segmental and somatic dysfunction of thoracic region: Secondary | ICD-10-CM | POA: Diagnosis not present

## 2018-12-31 DIAGNOSIS — M9901 Segmental and somatic dysfunction of cervical region: Secondary | ICD-10-CM | POA: Diagnosis not present

## 2019-01-18 ENCOUNTER — Other Ambulatory Visit: Payer: Self-pay | Admitting: Family Medicine

## 2019-01-18 DIAGNOSIS — E1159 Type 2 diabetes mellitus with other circulatory complications: Secondary | ICD-10-CM

## 2019-01-23 DIAGNOSIS — G894 Chronic pain syndrome: Secondary | ICD-10-CM | POA: Diagnosis not present

## 2019-01-23 DIAGNOSIS — M545 Low back pain: Secondary | ICD-10-CM | POA: Diagnosis not present

## 2019-01-23 DIAGNOSIS — Z79891 Long term (current) use of opiate analgesic: Secondary | ICD-10-CM | POA: Diagnosis not present

## 2019-01-23 DIAGNOSIS — M25569 Pain in unspecified knee: Secondary | ICD-10-CM | POA: Diagnosis not present

## 2019-01-23 DIAGNOSIS — Z79899 Other long term (current) drug therapy: Secondary | ICD-10-CM | POA: Diagnosis not present

## 2019-01-23 DIAGNOSIS — M47816 Spondylosis without myelopathy or radiculopathy, lumbar region: Secondary | ICD-10-CM | POA: Diagnosis not present

## 2019-02-10 ENCOUNTER — Other Ambulatory Visit: Payer: Self-pay | Admitting: Family Medicine

## 2019-02-10 DIAGNOSIS — M159 Polyosteoarthritis, unspecified: Secondary | ICD-10-CM

## 2019-02-19 DIAGNOSIS — M545 Low back pain: Secondary | ICD-10-CM | POA: Diagnosis not present

## 2019-02-19 DIAGNOSIS — M47816 Spondylosis without myelopathy or radiculopathy, lumbar region: Secondary | ICD-10-CM | POA: Diagnosis not present

## 2019-02-19 DIAGNOSIS — G894 Chronic pain syndrome: Secondary | ICD-10-CM | POA: Diagnosis not present

## 2019-02-19 DIAGNOSIS — M25569 Pain in unspecified knee: Secondary | ICD-10-CM | POA: Diagnosis not present

## 2019-03-04 DIAGNOSIS — M25561 Pain in right knee: Secondary | ICD-10-CM | POA: Diagnosis not present

## 2019-03-04 DIAGNOSIS — M25562 Pain in left knee: Secondary | ICD-10-CM | POA: Diagnosis not present

## 2019-03-11 DIAGNOSIS — M9902 Segmental and somatic dysfunction of thoracic region: Secondary | ICD-10-CM | POA: Diagnosis not present

## 2019-03-11 DIAGNOSIS — M5116 Intervertebral disc disorders with radiculopathy, lumbar region: Secondary | ICD-10-CM | POA: Diagnosis not present

## 2019-03-11 DIAGNOSIS — M542 Cervicalgia: Secondary | ICD-10-CM | POA: Diagnosis not present

## 2019-03-11 DIAGNOSIS — M9905 Segmental and somatic dysfunction of pelvic region: Secondary | ICD-10-CM | POA: Diagnosis not present

## 2019-03-11 DIAGNOSIS — M461 Sacroiliitis, not elsewhere classified: Secondary | ICD-10-CM | POA: Diagnosis not present

## 2019-03-11 DIAGNOSIS — M9904 Segmental and somatic dysfunction of sacral region: Secondary | ICD-10-CM | POA: Diagnosis not present

## 2019-03-11 DIAGNOSIS — M9901 Segmental and somatic dysfunction of cervical region: Secondary | ICD-10-CM | POA: Diagnosis not present

## 2019-03-11 DIAGNOSIS — M546 Pain in thoracic spine: Secondary | ICD-10-CM | POA: Diagnosis not present

## 2019-03-12 ENCOUNTER — Other Ambulatory Visit: Payer: Self-pay | Admitting: Family Medicine

## 2019-03-12 ENCOUNTER — Telehealth: Payer: Self-pay | Admitting: Family Medicine

## 2019-03-12 DIAGNOSIS — E1159 Type 2 diabetes mellitus with other circulatory complications: Secondary | ICD-10-CM

## 2019-03-12 NOTE — Telephone Encounter (Signed)
Advised pt that 90 day supply was sent to pharmacy on 01/19/19 and that it is too early to send refill at this time.  Advised pt to check with pharmacy for refill.  Pt expressed understanding and is agreeable.  Charyl Bigger, CMA

## 2019-03-12 NOTE — Telephone Encounter (Signed)
Patient is requesting a refill of his Olmesartan-amlodipine, if approved please send to [facility] on [address].

## 2019-03-18 DIAGNOSIS — M542 Cervicalgia: Secondary | ICD-10-CM | POA: Diagnosis not present

## 2019-03-18 DIAGNOSIS — M5116 Intervertebral disc disorders with radiculopathy, lumbar region: Secondary | ICD-10-CM | POA: Diagnosis not present

## 2019-03-18 DIAGNOSIS — M9904 Segmental and somatic dysfunction of sacral region: Secondary | ICD-10-CM | POA: Diagnosis not present

## 2019-03-18 DIAGNOSIS — M9902 Segmental and somatic dysfunction of thoracic region: Secondary | ICD-10-CM | POA: Diagnosis not present

## 2019-03-18 DIAGNOSIS — M546 Pain in thoracic spine: Secondary | ICD-10-CM | POA: Diagnosis not present

## 2019-03-18 DIAGNOSIS — M9905 Segmental and somatic dysfunction of pelvic region: Secondary | ICD-10-CM | POA: Diagnosis not present

## 2019-03-18 DIAGNOSIS — M461 Sacroiliitis, not elsewhere classified: Secondary | ICD-10-CM | POA: Diagnosis not present

## 2019-03-18 DIAGNOSIS — M9901 Segmental and somatic dysfunction of cervical region: Secondary | ICD-10-CM | POA: Diagnosis not present

## 2019-04-03 ENCOUNTER — Telehealth: Payer: Self-pay | Admitting: Family Medicine

## 2019-04-03 DIAGNOSIS — Z9189 Other specified personal risk factors, not elsewhere classified: Secondary | ICD-10-CM

## 2019-04-03 NOTE — Telephone Encounter (Signed)
Patient called states has never been Dx with by a medical provider  W/ Herpes but wife has (pt has very rare outbreaks & when he does he takes his wife's medication instead of getting his own) --Per patient he has not disclosed Herpes info to Dr. Raliegh Scarlet at any of his Appts-- Advised pt that provider will require an OV or Telehealth for treatment & she would not prescribe Rx without seeing him 1st.   --Forwarding message to medical assistant.  --Dion Body

## 2019-04-06 NOTE — Telephone Encounter (Signed)
Pt informed.  Pt scheduled for labs on 04/07/2019.  Charyl Bigger, CMA

## 2019-04-06 NOTE — Telephone Encounter (Signed)
See below and advise is telehealth for this is ok

## 2019-04-06 NOTE — Telephone Encounter (Signed)
Yes, but please let pt know we should run labs on him for HSV I and II IgM and IgG levels.  So, in future when he gets labs, these should be added.   You can have pt actually come in and get these today or tomorrow so we may have them prior to telehealth ov for this new condition

## 2019-04-07 ENCOUNTER — Ambulatory Visit: Payer: Medicare Other

## 2019-04-07 ENCOUNTER — Other Ambulatory Visit: Payer: Self-pay

## 2019-04-07 ENCOUNTER — Other Ambulatory Visit (INDEPENDENT_AMBULATORY_CARE_PROVIDER_SITE_OTHER): Payer: Medicare Other

## 2019-04-07 VITALS — BP 115/67 | HR 67 | Temp 98.4°F

## 2019-04-07 DIAGNOSIS — Z23 Encounter for immunization: Secondary | ICD-10-CM

## 2019-04-07 DIAGNOSIS — Z9189 Other specified personal risk factors, not elsewhere classified: Secondary | ICD-10-CM

## 2019-04-07 NOTE — Progress Notes (Signed)
Pt here for influenza vaccine.  Screening questionnaire reviewed, VIS provided to patient, and any/all patient questions answered.  T. Exodus Kutzer, CMA  

## 2019-04-08 ENCOUNTER — Telehealth: Payer: Self-pay | Admitting: Family Medicine

## 2019-04-08 NOTE — Telephone Encounter (Signed)
Patient called states woke up this morning w/ Fever & Headaches after receiving Flu Shot yesterday 9/22--pt called & left message while office closed for lunch.  --Forwarding request to medical assistant to contact pt w/ advice.  -glh

## 2019-04-09 LAB — HSV(HERPES SMPLX)ABS-I+II(IGG+IGM)-BLD
HSV 1 Glycoprotein G Ab, IgG: 3.39 index — ABNORMAL HIGH (ref 0.00–0.90)
HSV 2 IgG, Type Spec: 7.36 index — ABNORMAL HIGH (ref 0.00–0.90)
HSVI/II Comb IgM: <0.91 Ratio (ref 0.00–0.90)

## 2019-04-09 NOTE — Telephone Encounter (Signed)
Pt states that he is no longer running a fever.  Denies redness, swelling or being hot to touch at region where immunization was given.  Advised pt that if he develops any of these symptoms or has a fever again to call us back.  Pt expressed understanding and is agreeable.  Charyl Bigger, CMA

## 2019-04-09 NOTE — Telephone Encounter (Signed)
LVM for pt to call to discuss.  T. Emmaline Wahba, CMA  

## 2019-04-11 ENCOUNTER — Other Ambulatory Visit: Payer: Self-pay | Admitting: Family Medicine

## 2019-04-11 DIAGNOSIS — E1159 Type 2 diabetes mellitus with other circulatory complications: Secondary | ICD-10-CM

## 2019-04-13 ENCOUNTER — Telehealth: Payer: Self-pay

## 2019-04-13 MED ORDER — VALACYCLOVIR HCL 1 G PO TABS: 1000.0000 mg | ORAL_TABLET | Freq: Every day | ORAL | 1 refills | Status: DC | PRN

## 2019-04-13 NOTE — Telephone Encounter (Signed)
Spoke with patient regarding HSV results.  He says outbreaks happen every couple months depending on stress level.  Usually located on left side of penis and groin.  He has taken his wives antiviral in the past when needed.  He would like and rx for acute outbreaks.  Rx sent.

## 2019-04-13 NOTE — Telephone Encounter (Signed)
Please call pt to schedule f/u for HTN and DM.  No further refills until pt is seen.  Charyl Bigger, CMA

## 2019-04-13 NOTE — Telephone Encounter (Signed)
-----   Message from [name], DO sent at 04/10/2019  7:15 AM EDT ----- Please call make patient aware he is positive for HSV-1 and HSV-2.    Please confirm with patient how often he gets outbreaks and where they are.  Please document that.  Also, please let patient know we can prescribe short term antiviral medicines to shorten the length of his acute outbreaks if he would desire.   However, I have never evaluated him for this hence, I do not know the past history of this.  Thanks.

## 2019-04-14 ENCOUNTER — Encounter: Payer: Self-pay | Admitting: Internal Medicine

## 2019-04-15 ENCOUNTER — Other Ambulatory Visit: Payer: Self-pay | Admitting: Family Medicine

## 2019-04-15 ENCOUNTER — Telehealth: Payer: Self-pay

## 2019-04-15 DIAGNOSIS — M159 Polyosteoarthritis, unspecified: Secondary | ICD-10-CM

## 2019-04-15 NOTE — Telephone Encounter (Signed)
Please call pt to schedule f/u.  NO further refills until pt is seen.  T. Taavi Hoose, CMA 

## 2019-04-22 DIAGNOSIS — M25569 Pain in unspecified knee: Secondary | ICD-10-CM | POA: Diagnosis not present

## 2019-04-22 DIAGNOSIS — M542 Cervicalgia: Secondary | ICD-10-CM | POA: Diagnosis not present

## 2019-04-22 DIAGNOSIS — M461 Sacroiliitis, not elsewhere classified: Secondary | ICD-10-CM | POA: Diagnosis not present

## 2019-04-22 DIAGNOSIS — M545 Low back pain: Secondary | ICD-10-CM | POA: Diagnosis not present

## 2019-04-22 DIAGNOSIS — Z79891 Long term (current) use of opiate analgesic: Secondary | ICD-10-CM | POA: Diagnosis not present

## 2019-04-22 DIAGNOSIS — M9902 Segmental and somatic dysfunction of thoracic region: Secondary | ICD-10-CM | POA: Diagnosis not present

## 2019-04-22 DIAGNOSIS — M9905 Segmental and somatic dysfunction of pelvic region: Secondary | ICD-10-CM | POA: Diagnosis not present

## 2019-04-22 DIAGNOSIS — G894 Chronic pain syndrome: Secondary | ICD-10-CM | POA: Diagnosis not present

## 2019-04-22 DIAGNOSIS — M546 Pain in thoracic spine: Secondary | ICD-10-CM | POA: Diagnosis not present

## 2019-04-22 DIAGNOSIS — M9904 Segmental and somatic dysfunction of sacral region: Secondary | ICD-10-CM | POA: Diagnosis not present

## 2019-04-22 DIAGNOSIS — M5116 Intervertebral disc disorders with radiculopathy, lumbar region: Secondary | ICD-10-CM | POA: Diagnosis not present

## 2019-04-22 DIAGNOSIS — Z79899 Other long term (current) drug therapy: Secondary | ICD-10-CM | POA: Diagnosis not present

## 2019-04-22 DIAGNOSIS — M9901 Segmental and somatic dysfunction of cervical region: Secondary | ICD-10-CM | POA: Diagnosis not present

## 2019-04-22 DIAGNOSIS — M47816 Spondylosis without myelopathy or radiculopathy, lumbar region: Secondary | ICD-10-CM | POA: Diagnosis not present

## 2019-04-27 ENCOUNTER — Other Ambulatory Visit: Payer: Self-pay | Admitting: Family Medicine

## 2019-04-27 DIAGNOSIS — M159 Polyosteoarthritis, unspecified: Secondary | ICD-10-CM

## 2019-05-01 ENCOUNTER — Encounter: Payer: Self-pay | Admitting: Internal Medicine

## 2019-05-06 ENCOUNTER — Encounter: Payer: Self-pay | Admitting: Family Medicine

## 2019-05-06 ENCOUNTER — Ambulatory Visit (INDEPENDENT_AMBULATORY_CARE_PROVIDER_SITE_OTHER): Payer: Medicare Other | Admitting: Family Medicine

## 2019-05-06 ENCOUNTER — Other Ambulatory Visit: Payer: Self-pay

## 2019-05-06 VITALS — BP 117/69 | HR 65 | Temp 98.3°F | Resp 16 | Ht 73.0 in | Wt 346.0 lb

## 2019-05-06 DIAGNOSIS — G4733 Obstructive sleep apnea (adult) (pediatric): Secondary | ICD-10-CM

## 2019-05-06 DIAGNOSIS — I10 Essential (primary) hypertension: Secondary | ICD-10-CM

## 2019-05-06 DIAGNOSIS — E559 Vitamin D deficiency, unspecified: Secondary | ICD-10-CM

## 2019-05-06 DIAGNOSIS — Z9989 Dependence on other enabling machines and devices: Secondary | ICD-10-CM

## 2019-05-06 DIAGNOSIS — E782 Mixed hyperlipidemia: Secondary | ICD-10-CM

## 2019-05-06 DIAGNOSIS — M9904 Segmental and somatic dysfunction of sacral region: Secondary | ICD-10-CM | POA: Diagnosis not present

## 2019-05-06 DIAGNOSIS — E118 Type 2 diabetes mellitus with unspecified complications: Secondary | ICD-10-CM

## 2019-05-06 DIAGNOSIS — F329 Major depressive disorder, single episode, unspecified: Secondary | ICD-10-CM

## 2019-05-06 DIAGNOSIS — M542 Cervicalgia: Secondary | ICD-10-CM | POA: Diagnosis not present

## 2019-05-06 DIAGNOSIS — E1159 Type 2 diabetes mellitus with other circulatory complications: Secondary | ICD-10-CM | POA: Diagnosis not present

## 2019-05-06 DIAGNOSIS — F32A Depression, unspecified: Secondary | ICD-10-CM

## 2019-05-06 DIAGNOSIS — M546 Pain in thoracic spine: Secondary | ICD-10-CM | POA: Diagnosis not present

## 2019-05-06 DIAGNOSIS — E1169 Type 2 diabetes mellitus with other specified complication: Secondary | ICD-10-CM | POA: Diagnosis not present

## 2019-05-06 DIAGNOSIS — M5116 Intervertebral disc disorders with radiculopathy, lumbar region: Secondary | ICD-10-CM | POA: Diagnosis not present

## 2019-05-06 DIAGNOSIS — M9902 Segmental and somatic dysfunction of thoracic region: Secondary | ICD-10-CM | POA: Diagnosis not present

## 2019-05-06 DIAGNOSIS — M9901 Segmental and somatic dysfunction of cervical region: Secondary | ICD-10-CM | POA: Diagnosis not present

## 2019-05-06 DIAGNOSIS — M461 Sacroiliitis, not elsewhere classified: Secondary | ICD-10-CM | POA: Diagnosis not present

## 2019-05-06 DIAGNOSIS — M9905 Segmental and somatic dysfunction of pelvic region: Secondary | ICD-10-CM | POA: Diagnosis not present

## 2019-05-06 LAB — POCT GLYCOSYLATED HEMOGLOBIN (HGB A1C): Hemoglobin A1C: 5.9 % — AB (ref 4.0–5.6)

## 2019-05-06 LAB — POCT UA - MICROALBUMIN
Creatinine, POC: 50 mg/dL
Microalbumin Ur, POC: 10 mg/L

## 2019-05-06 NOTE — Patient Instructions (Addendum)
Dr. Norberto Sorenson M.D. Practice name: [facility]   [address] [place], [place] [address]  Main #[phone]  --> They also have a counselor there as well.  -------------------------------------------------------------------------------------------------------------------------------------------  For LGBTQ+ support groups:  Mental Health of [facility])  (Which they offer various support groups for alcohol & drug recovery counseling, as well as grief and loss counseling as well)  - Go to 'Facebook groups' and search "mhg".  To go on the mental health of [employer] -The group for the LGBTQ support is called "renewal -And they are currently doing Zoom meetings due to COVID restrictions.  -For more information about the Mental Health of [place] support groups or if you have difficulty signing into the zoom meeting etc. please email one of the counselors- Rick: [email or web address]            Behavioral Health/ Counseling Referrals      [facility] Simmie Davies Phipps:  [address] [facility] [address] [place], [place] [address] [phone] (not exclusive, is a Chief Strategy Officer, but does have special interest in eating disorders, body dysmorphia syndrome etc)     [facility]: Counselors : Danise Edge and partner Irean Hong in Adult & Pediatric Nutrition Counseling Therapy for:  Eating Disorders  Disordered & Emotional Eating  Non-Diet Nutrition Counseling  Family Feeding Coaching  Weight- Neutral Diabetes Management    Purvis Kilts, personal counselor in [place]     Haydee Salter, specializing in marriage counseling    Dr. Marvene Staff, PHD Dr. Marvene Staff, PHD is a counselor in [place], [place].  [address] [place], [place] [address] Contact Information [phone]   Francee Nodal, [place]  [phone] [email or web address] ( she does [place] and faith-based coaching and counseling )    [employer]- ( faith-based counseling ) Address: [address] [place], [place] [address] [phone] Office [address] for appointments [phone] Fax Hours: Monday - Thursday 8:00am-6:00pm Closed for lunch 12-1Thursday only Friday: Closed all day   Danae Orleans: [phone] or Meg [name] -counselors in [place] who are faith based   -Also Ms. Elisha Ponder - [place] [facility]. Ephriam Knuckles based counseling.    [facility] Hurley Cisco, LCSW, ACSW, M.ED.  -Hurley Cisco is a licensed clinical social worker in practice over 35 years and with Dr. Milagros Evener for the last 10 years.  -She sees adults, adolescents, children & families and couples. -Services are provided for mood and anxiety disorders, marital issues, family or parent/child problems, parenting, co-dependency, gender issues, trauma, grief, and stages of life issues. She also provides critical incident stress debriefing.  -Britta Mccreedy accepts many employee assistance programs (EAP), [employer], Chiropractor.  PHONE  [phone]                FAX [phone]   Bethann Berkshire -[email or web address] [employer]- gen counseling;  PHD   Corine Shelter, MSW [address] [place] [phone]   [facility] Caralyn Guile, PhD [address], [facility] [phone]   [facility]- children [address], [address], [place] [phone]    [facility]- various counselors [place], [place] [address] [phone] ( one patient saw Carolynn Serve and really liked her)    Heloise Beecham Professional Counselor Counseling and [employer] [phone]   [facility]  Behavioral Outpatient [facility] abuse [facility] Manager [address], [place] [phone] [place] [address], [place] Delmer Islam, PhD **   -adult, adolescent, and child Oneida Arenas, PhD Leitha Bleak, LCSW Jillene Bucks, PhD-child, adolescent and adults   [facility] [address], [place] [phone] Merilyn Baba, MS-child, adolescent and adults Lennart Pall, PhD-adolescent and adults   [facility]-grief, terminal illness [place], [place] [address], [employer] [address] [place], [place]   [facility] [address] [place], [place] [phone]   [facility] of the [facility] [address], [place] [phone]   [facility] [address], [address], [place]   [facility] [address] [place] [address], [place]

## 2019-05-06 NOTE — Progress Notes (Signed)
Impression and Recommendations:    1. Type 2 diabetes mellitus with complication, without long-term current use of insulin ([place])   2. Hypertension associated with diabetes ([place])   3. Mixed diabetic hyperlipidemia associated with type 2 diabetes mellitus ([place])   4. Morbid (severe) obesity due to excess calories ([place])   5. OSA on CPAP- seen Dr Melvyn Novas in past   6. Vitamin D deficiency   7. h/o Depressive disorder     Type 2 diabetes mellitus with complication, without long-term current use of insulin - HbA1c is stable, unchanged from a year ago at 5.9, currently at goal. - Pt will continue current treatment regimen   - Counseled patient on pathophysiology of disease and discussed various treatment options, which always includes dietary and lifestyle modification as first line.    - Importance of low carb, heart-healthy diet discussed with patient in addition to regular aerobic exercise of 45mn 5d/week or more.   - Check FBS and 2 hours after the biggest meal of your day.  Keep log and bring in next OV for my review.     - Also told patient if you ever feel poorly, please check your blood pressure and blood sugar, as one or the other could be the cause of your symptoms.  - Pt reminded about need for yearly eye and foot exams.  Told patient to make appt.for diabetic eye exam, CMAs here will do foot exams  - Handouts provided at patient's desire and or told to go online at the American Diabetes Association website for further information  - We will continue to monitor   Microalbuminuria due to Diabetes Mellitus - Discussed meaning of proteinuria with patient today. - Reviewed that the two most common reasons for this are diabetes and hypertension. - Explained need to keep blood sugar and blood pressure under control to help preserve kidney health.  - Encouraged increased physical activity and hydration for organ wellbeing. - Advised patient to reduce caffeine intake, starting  with half-and-half.  - Will continue to monitor.   Hypertension associated with diabetes - Blood pressure currently is at goal. - Patient will continue current treatment regimen  - Counseled patient on pathophysiology of disease and discussed various treatment options, which always includes dietary and lifestyle modification as first line.   - Lifestyle changes such as dash and heart healthy diets and engaging in a regular exercise program discussed extensively with patient.   - Ambulatory blood pressure monitoring encouraged at least 3 times weekly.  Keep log and bring in every office visit.  Reminded patient that if they ever feel poorly in any way, to check their blood pressure and pulse.  - Handouts provided at patient's desire and/or told to go online at the AGreenwoodwebsite for further information  - We will continue to monitor   Mixed diabetic hyperlipidemia associated with type 2 diabetes mellitus - Continue statin management as prescribed.  See med list. -  Need for fasting lipid profile re-check in near future.   OSA on CPAP- seen Dr WMelvyn Novasin past - Advised patient to have his machine interrogated/replaced as advised. - Discussed importance of ensuring OSA is managed appropriately.  - Advised patient to obtain further sleep study or CPAP replacement as recommended. - Ambulatory referral to sleep medicine sent today. - Patient prefers follow-up with pulmonology.  - Discussed prudent use of albuterol with patient today, and need to use sparingly. - Advised that patient may use albuterol inhaler prior  to intensive physical activity.  - Will continue to monitor.   BMI Counseling - Morbid (severe) obesity due to excess calories Explained to patient what BMI refers to, and what it means medically.    Told patient to think about it as a "medical risk stratification measurement" and how increasing BMI is associated with increasing risk/ or worsening state of  various diseases such as hypertension, hyperlipidemia, diabetes, premature OA, depression etc.  American Heart Association guidelines for healthy diet, basically Mediterranean diet, and exercise guidelines of 30 minutes 5 days per week or more discussed in detail.  Health counseling performed.  All questions answered.   Lifestyle & Preventative Health Maintenance - Advised patient to continue working toward exercising to improve overall mental, physical, and emotional health.    - Reviewed the "spokes of the wheel" of mood and health management.  Stressed the importance of ongoing prudent habits, including regular exercise, appropriate sleep hygiene, healthful dietary habits, and prayer/meditation to relax.  - Encouraged patient to engage in daily physical activity, especially a formal exercise routine.  Recommended that the patient eventually strive for at least 150 minutes of moderate cardiovascular activity per week according to guidelines established by the [facility].   - Healthy dietary habits encouraged, including low-carb, and high amounts of lean protein in diet.   - Patient should also consume adequate amounts of water.   Education and routine counseling performed. Handouts provided.   Recommendations - Discussed that patient is due for physical in near future. - Follow up for CPE with full fasting blood work end of November.  - Discussed need for patient to continue to obtain management and screenings with all established specialists.  Educated patient at length about the critical importance of keeping health maintenance up to date.  - Participated in lengthy conversation and all questions were answered.    Orders Placed This Encounter  Procedures  . POCT UA - Microalbumin  . POCT glycosylated hemoglobin (Hb A1C)    Medications Discontinued During This Encounter  Medication Reason  . mupirocin nasal ointment (BACTROBAN) 2 % Completed Course  . olopatadine (PATANOL) 0.1 %  ophthalmic solution Completed Course  . Vitamin D, Ergocalciferol, (DRISDOL) 1.25 MG (50000 UT) CAPS capsule Cost of medication      The patient was counseled, risk factors were discussed, anticipatory guidance given.  Gross side effects, risk and benefits, and alternatives of medications discussed with patient.  Patient is aware that all medications have potential side effects and we are unable to predict every side effect or drug-drug interaction that may occur.  Expresses verbal understanding and consents to current therapy plan and treatment regimen.   Return for f/up one month full fasting lab work and initial [employer] in office.   Please see AVS handed out to patient at the end of our visit for further patient instructions/ counseling done pertaining to today's office visit.    Note:  This document was prepared using Dragon voice recognition software and may include unintentional dictation errors.   This document serves as a record of services personally performed by [name], DO. It was created on her behalf by Toni Amend, a trained medical scribe. The creation of this record is based on the scribe's personal observations and the provider's statements to them.   This case required medical decision making of at least moderate complexity. The above documentation has been reviewed to be accurate and was completed by Marjory Sneddon, D.O.    Subjective:  Chief Complaint  Patient presents with  . Follow-up  . Diabetes  . Hypertension   Robert [name] is a 63 y.o. male who presents to [facility] [facility] for Diabetes Management.    States he drinks a lot of caffeinated tea, especially green tea, and tries to stay hydrated.  Does admit that he loves his caffeinated tea daily.  - OSA managed on CPAP Per patient, has not had his machine checked in years, but has had it for years.  Thinks it's been seven years since he got his  machine, but maybe not ten years.  Patient prefers to follow up with pulmonology.  Hasn't used his albuterol inhaler in a long while.  "I've got it but I don't know if it's still good."  DM HPI: -  He has not been working on diet and exercise for diabetes  Pt is currently maintained on the following medications for diabetes:   see med list today Medication compliance - continues as prescribed.  Home glucose readings range - has not been checking his sugars at home, but feels good; denies feelings of highs or lows or other symptoms.   Denies polyuria/polydipsia. Denies hypo/ hyperglycemia symptoms - He denies new onset of: chest pain, exercise intolerance, shortness of breath, dizziness, visual changes, headache, lower extremity swelling or claudication.   Last diabetic eye exam was No results found for: HMDIABEYEEXA  Foot exam- UTD  Last A1C in the office was:  Lab Results  Component Value Date   HGBA1C 5.9 (A) 05/06/2019   HGBA1C 5.9 (H) 06/06/2018   HGBA1C 5.8 (H) 03/17/2018    Lab Results  Component Value Date   MICROALBUR 10 05/06/2019   H. Rivera Colon 97 06/06/2018   CREATININE 0.90 06/12/2018    Last 3 blood pressure readings in our office are as follows: BP Readings from Last 3 Encounters:  05/06/19 117/69  04/07/19 115/67  09/11/18 111/62    BMI Readings from Last 3 Encounters:  05/06/19 45.65 kg/m  12/03/18 44.94 kg/m  10/14/18 44.30 kg/m    Patient Care Team    Relationship Specialty Notifications Start End  [name], DO PCP - General Family Medicine  02/20/16   Artist Pais, MD  Orthopedic Surgery  04/11/16   Dian Situ, MD  Pain Medicine  04/11/16   Tanda Rockers, MD Consulting Physician Pulmonary Disease  04/11/16    Comment: Obstructive sleep apnea, restrictive lung disease due to severe morbid obesity  Awilda Metro, PA-C  Pain Medicine  07/23/17   Newt Minion, MD Consulting Physician Orthopedic Surgery  08/22/18       Patient Active Problem List   Diagnosis Date Noted  . Type 2 diabetes mellitus with complication, without long-term current use of insulin ([place]) 09/30/2017    Priority: High  . Hypertension associated with diabetes ([place]) 09/30/2017    Priority: High  . Mixed diabetic hyperlipidemia associated with type 2 diabetes mellitus ([place]) 09/30/2017    Priority: High  . Morbid (severe) obesity due to excess calories ([place]) 02/15/2012    Priority: High  . 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 Chronic venous stasis dermatitis of both lower extremities 05/08/2016    Priority: Medium  . OSA on CPAP- seen Dr Melvyn Novas in past 04/11/2016    Priority: Medium  . Restrictive lung disease secondary to obesity 04/11/2016    Priority:  Medium  . h/o Depressive disorder 04/11/2016    Priority: Medium  . Vitamin D deficiency 05/08/2016    Priority: Low  . Physical deconditioning 04/11/2016    Priority: Low  . History of tobacco use 04/11/2016    Priority: Low  . Postlaminectomy syndrome, lumbar region 01/04/2012    Priority: Low  . h/o Narcotic abuse 01/04/2012    Priority: Low  . Constipation 09/22/2011    Priority: Low  . Chronic back pain 09/22/2011    Priority: Low  . Conjunctivitis due to adenovirus, left eye 09/11/2018  . Type 2 diabetes mellitus with foot ulcer (CODE) ([place]) 08/21/2018  . Amputation of second toe, right, traumatic, sequela ([place]) 08/21/2018  . Osteomyelitis of second toe of right foot ([place])   . Lower extremity cellulitis 06/08/2018  . Hepatitis C antibody test positive 04/16/2018  . Osteomyelitis of toe of right foot ([place]) 04/10/2018  . Hyperlipidemia 04/08/2018  . Recurrent cellulitis of lower extremity 04/07/2018  . Pancytopenia ([place]) 03/15/2018  . DJD (degenerative joint disease) 01/09/2018  . Barrett's esophagus 12/07/2016  . Bilateral lower extremity pitting edema 04/11/2016  . Lumbar  radiculopathy 07/21/2012  . Chronic pain due to injury 07/21/2012     Past Medical History:  Diagnosis Date  . Allergic rhinitis due to pollen   . Arthritis   . Asthma   . Back pain   . Barrett esophagus   . Depressive disorder   . Diabetes mellitus type 2 in obese ([place])   . Exposure to hepatitis B   . Exposure to hepatitis C   . Narcotic abuse ([facility])    pain medications  . OSA (obstructive sleep apnea)    on CPAP      Past Surgical History:  Procedure Laterality Date  . ABDOMINAL SURGERY    . AMPUTATION Right 06/11/2018   Procedure: RIGHT 2ND RAY AMPUTATION;  Surgeon: Newt Minion, MD;  Location: [facility];  Service: Orthopedics;  Laterality: Right;  . BACK SURGERY     Rods and screws in lumbar area  . COLONOSCOPY    . ESOPHAGOGASTRODUODENOSCOPY  multiple  . HERNIA REPAIR     4  . JOINT REPLACEMENT    . TOTAL KNEE ARTHROPLASTY  2009   left     Family History  Problem Relation Age of Onset  . Allergies Mother   . Early death Father   . Thyroid disease Sister   . Colon cancer Neg Hx   . Stomach cancer Neg Hx      Social History   Substance and Sexual Activity  Drug Use No   Comment: Prior abuse of narcotics  ,  Social History   Substance and Sexual Activity  Alcohol Use No  ,  Social History   Tobacco Use  Smoking Status Former Smoker  . Packs/day: 0.80  . Years: 15.00  . Pack years: 12.00  . Types: Cigarettes  . Quit date: 07/16/2004  . Years since quitting: 14.8  Smokeless Tobacco Never Used  ,    Current Outpatient Medications on File Prior to Visit  Medication Sig Dispense Refill  . APPLE CIDER VINEGAR PO Take 15 mLs by mouth 2 (two) times daily.     Marland Kitchen atorvastatin (LIPITOR) 20 MG tablet TAKE 1 TABLET BY MOUTH EVERYDAY AT BEDTIME (Patient taking differently: Take 20 mg by mouth daily at 6 PM. ) 90 tablet 0  . B Complex Vitamins (B COMPLEX 100 PO) Take 1 capsule by mouth every other day.     Marland Kitchen  blood glucose meter kit and supplies KIT  Dispense based on patient and insurance preference. Use up to four times daily as directed. (FOR ICD-9 250.00, 250.01). 1 each 0  . blood glucose meter kit and supplies Dispense based on patient and insurance preference. Use to check glucose fasting in the AM and then after largest meal of the day.. (FOR ICD-10 E10.9, E11.9). 1 each 0  . Blood Glucose Monitoring Suppl (ONE TOUCH ULTRA MINI) w/Device KIT Use to test blood sugar, test in the morning fasting and test after largest meal of the day. 1 each 0  . CALCIUM-MAGNESIUM-ZINC PO Take 1 tablet by mouth every other day.    . Cholecalciferol (VITAMIN D3) 5000 units TABS 5,000 IU OTC vitamin D3 daily. (Patient taking differently: Take 5,000 Units by mouth daily. ) 90 tablet 3  . cyclobenzaprine (FLEXERIL) 10 MG tablet TAKE 1 TABLET BY MOUTH 2 (TWO) TIMES DAILY. OFFICE VISIT REQUIRED PRIOR TO ANY FURTHER REFILLS 30 tablet 0  . DANDELION PO Take 1 tablet by mouth every other day.    Marland Kitchen glucose blood (ONE TOUCH ULTRA TEST) test strip Use to test blood sugar, test in the morning fasting and test after largest meal of the day. 100 each 11  . meloxicam (MOBIC) 15 MG tablet TAKE 1 TABLET BY MOUTH EVERY DAY 90 tablet 1  . metFORMIN (GLUCOPHAGE) 500 MG tablet TAKE 2 TABLETS (1,000 MG TOTAL) BY MOUTH 2 (TWO) TIMES DAILY WITH A MEAL. 360 tablet 1  . mupirocin ointment (BACTROBAN) 2 % PLACE 1 APPLICATION INTO THE NOSE 2 (TWO) TIMES DAILY FOR 5 DAYS    . NARCAN 4 MG/0.1ML LIQD nasal spray kit 1 SPRAY AS NEEDED    . ONETOUCH DELICA LANCETS 25K MISC Use for testing blood sugar, test once in the morning fasting and test after largest meal of the day. 100 each 11  . Potassium 99 MG TABS Take 99 mg by mouth every other day.     . traMADol (ULTRAM) 50 MG tablet Take 2 tablets (100 mg total) by mouth every 6 (six) hours as needed. For pain 30 tablet   . TURMERIC CURCUMIN PO Take 1 tablet by mouth every other day.    . valACYclovir (VALTREX) 1000 MG tablet Take 1 tablet  (1,000 mg total) by mouth daily as needed (for outbreaks only). 5 tablet 1  . VENTOLIN HFA 108 (90 Base) MCG/ACT inhaler INHALE 1-2 PUFFS INTO THE LUNGS EVERY 6 (SIX) HOURS AS NEEDED FOR WHEEZING OR SHORTNESS OF BREATH. 18 Inhaler 0  . vitamin B-12 1000 MCG tablet Take 1 tablet (1,000 mcg total) by mouth daily. 30 tablet 0  . vitamin C (ASCORBIC ACID) 500 MG tablet Take 500 mg by mouth daily.    Marland Kitchen VITAMIN E PO Take 1 tablet by mouth daily.     No current facility-administered medications on file prior to visit.      No Known Allergies   Review of Systems:   General:  Denies fever, chills Optho/Auditory:   Denies visual changes, blurred vision Respiratory:   Denies SOB, cough, wheeze, DIB  Cardiovascular:   Denies chest pain, palpitations, painful respirations Gastrointestinal:   Denies nausea, vomiting, diarrhea.  Endocrine:     Denies new hot or cold intolerance Musculoskeletal:  Denies joint swelling, gait issues, or new unexplained myalgias/ arthralgias Skin:  Denies rash, suspicious lesions  Neurological:    Denies dizziness, unexplained weakness, numbness  Psychiatric/Behavioral:   Denies mood changes    Objective:  Blood pressure 117/69, pulse 65, temperature 98.3 F (36.8 C), resp. rate 16, height 6' 1"  (1.854 m), weight (!) 346 lb (156.9 kg), SpO2 98 %.  Body mass index is 45.65 kg/m.  General: Well Developed, well nourished, and in no acute distress.  HEENT: Normocephalic, atraumatic, pupils equal round reactive to light, neck supple, No carotid bruits, no JVD Skin: Warm and dry, cap RF less 2 sec Cardiac: Regular rate and rhythm, S1, S2 WNL's, no murmurs rubs or gallops Respiratory: Scattered end expiratory wheezes, but otherwise essentially clear to auscultation B/L, Not using accessory muscles, speaking in full sentences. NeuroM-Sk: Ambulates w/o assistance, moves ext * 4 w/o difficulty, sensation grossly intact.  Ext: scant edema b/l lower ext Psych: No  HI/SI, judgement and insight good, Euthymic mood. Full Affect.

## 2019-05-08 ENCOUNTER — Other Ambulatory Visit: Payer: Self-pay | Admitting: Family Medicine

## 2019-05-08 DIAGNOSIS — I10 Essential (primary) hypertension: Secondary | ICD-10-CM

## 2019-05-08 DIAGNOSIS — E1159 Type 2 diabetes mellitus with other circulatory complications: Secondary | ICD-10-CM

## 2019-05-28 ENCOUNTER — Other Ambulatory Visit: Payer: Self-pay | Admitting: Family Medicine

## 2019-05-28 DIAGNOSIS — M159 Polyosteoarthritis, unspecified: Secondary | ICD-10-CM

## 2019-06-22 DIAGNOSIS — M25569 Pain in unspecified knee: Secondary | ICD-10-CM | POA: Diagnosis not present

## 2019-06-22 DIAGNOSIS — M545 Low back pain: Secondary | ICD-10-CM | POA: Diagnosis not present

## 2019-06-22 DIAGNOSIS — G894 Chronic pain syndrome: Secondary | ICD-10-CM | POA: Diagnosis not present

## 2019-06-22 DIAGNOSIS — M47816 Spondylosis without myelopathy or radiculopathy, lumbar region: Secondary | ICD-10-CM | POA: Diagnosis not present

## 2019-06-23 ENCOUNTER — Telehealth: Payer: Self-pay | Admitting: Family Medicine

## 2019-06-23 DIAGNOSIS — Z9989 Dependence on other enabling machines and devices: Secondary | ICD-10-CM

## 2019-06-23 NOTE — Telephone Encounter (Signed)
I assume this is for his obstructive sleep apnea.    I am not sure Dr. Melvyn Novas does sleep medicine as well.  If he does, that is fine to place referral to pulmonology with diagnosis of OSA and morbid obesity.  Otherwise, please place a referral to sleep medicine and request a pulmonologist.  Thank you.

## 2019-06-23 NOTE — Addendum Note (Signed)
Addended by: Mickel Crow on: 06/23/2019 10:52 AM   Modules accepted: Orders

## 2019-06-23 NOTE — Telephone Encounter (Signed)
Patient had discussed getting back up with Dr. Melvyn Novas at [facility] for ongoing sleep issues with PCP at last OV. He is going to need a new referral from [place] to see Dr. Melvyn Novas as its been several years since he last saw him. Please place referral is applicable

## 2019-06-23 NOTE — Telephone Encounter (Signed)
Please advise. AS, CMA 

## 2019-06-23 NOTE — Telephone Encounter (Signed)
Order has been placed. AS, CMA 

## 2019-06-24 ENCOUNTER — Encounter: Payer: Self-pay | Admitting: Pulmonary Disease

## 2019-06-24 ENCOUNTER — Ambulatory Visit: Payer: Medicare Other | Admitting: Adult Health

## 2019-06-24 ENCOUNTER — Other Ambulatory Visit: Payer: Self-pay

## 2019-06-24 ENCOUNTER — Ambulatory Visit: Payer: Medicare Other | Admitting: Pulmonary Disease

## 2019-06-24 ENCOUNTER — Other Ambulatory Visit: Payer: Medicare Other

## 2019-06-24 VITALS — BP 138/72 | HR 82 | Temp 97.2°F | Ht 73.0 in | Wt 347.8 lb

## 2019-06-24 DIAGNOSIS — Z9989 Dependence on other enabling machines and devices: Secondary | ICD-10-CM

## 2019-06-24 DIAGNOSIS — E782 Mixed hyperlipidemia: Secondary | ICD-10-CM

## 2019-06-24 DIAGNOSIS — E1159 Type 2 diabetes mellitus with other circulatory complications: Secondary | ICD-10-CM

## 2019-06-24 DIAGNOSIS — I152 Hypertension secondary to endocrine disorders: Secondary | ICD-10-CM

## 2019-06-24 DIAGNOSIS — D61818 Other pancytopenia: Secondary | ICD-10-CM

## 2019-06-24 DIAGNOSIS — G4733 Obstructive sleep apnea (adult) (pediatric): Secondary | ICD-10-CM | POA: Diagnosis not present

## 2019-06-24 DIAGNOSIS — E1169 Type 2 diabetes mellitus with other specified complication: Secondary | ICD-10-CM

## 2019-06-24 DIAGNOSIS — E559 Vitamin D deficiency, unspecified: Secondary | ICD-10-CM

## 2019-06-24 DIAGNOSIS — J984 Other disorders of lung: Secondary | ICD-10-CM | POA: Diagnosis not present

## 2019-06-24 DIAGNOSIS — E669 Obesity, unspecified: Secondary | ICD-10-CM | POA: Diagnosis not present

## 2019-06-24 DIAGNOSIS — E118 Type 2 diabetes mellitus with unspecified complications: Secondary | ICD-10-CM

## 2019-06-24 DIAGNOSIS — Z0000 Encounter for general adult medical examination without abnormal findings: Secondary | ICD-10-CM

## 2019-06-24 NOTE — Patient Instructions (Signed)
Obtain sleep studies from [facility]. Send prescription to DME for auto CPAP 10 to 20 cm with nasal pillows Obtain download in 1 month

## 2019-06-24 NOTE — Assessment & Plan Note (Signed)
[facility] from [facility]. Given that he has gained at least 50 pounds since his sleep study, it is likely that his pressure will need to be increased .  We will set him up with auto CPAP  Send prescription to DME for auto CPAP 10 to 20 cm with nasal pillows Obtain download in 1 month and tweak settings as needed  Weight loss encouraged, compliance with goal of at least 4-6 hrs every night is the expectation. Advised against medications with sedative side effects Cautioned against driving when sleepy - understanding that sleepiness will vary on a day to day basis

## 2019-06-24 NOTE — Assessment & Plan Note (Signed)
Weight loss encouraged 

## 2019-06-24 NOTE — Progress Notes (Signed)
Subjective:    Patient ID: Robert [name], male    DOB: [birth year], 63 y.o.   MRN: [id number]  HPI  63 year old obese diabetic presents to establish care for obstructive sleep apnea. He was diagnosed many years ago a sleep study done on [address] and was started on the CPAP with nasal pillows.  This was a godsend and has improved his daytime somnolence and fatigue.  He has had this machine for at least 7 years or more and now this is falling apart he is holding the humidifier together with duct tape and would like to get a new machine. Epworth sleepiness score is 7 and he reports sleepiness as a passenger in a car or lying down to rest in the afternoons. Bedtime is between 9 and 11 PM, sleep latency is about 30 minutes, he sleeps on his side with 1 pillow, reports 1-2 nocturnal awakenings including nocturia and is up around 8:30 AM still dragging with occasional dryness of mouth but denies headaches. He may have gained 50 to 70 pounds since his sleep study There is no history suggestive of cataplexy, sleep paralysis or parasomnias   He has settled down well with nasal pillows and denies any problems with mask or pressure.  Unfortunately a download is not available today he does not have a card in his machine. He denies excessive use of caffeinated beverages   Significant tests/ events reviewed  PFT's 02/15/2012 FEV1  3.60 (97%) ratio 72 and no change p B2,  ERV 41, dlco 73 corrects to 109    Past Medical History:  Diagnosis Date  . Allergic rhinitis due to pollen   . Arthritis   . Asthma   . Back pain   . Barrett esophagus   . Depressive disorder   . Diabetes mellitus type 2 in obese (HCC)   . Exposure to hepatitis B   . Exposure to hepatitis C   . Narcotic abuse (HCC)    pain medications  . OSA (obstructive sleep apnea)    on CPAP     Past Surgical History:  Procedure Laterality Date  . ABDOMINAL SURGERY    . AMPUTATION Right 06/11/2018   Procedure: RIGHT 2ND RAY  AMPUTATION;  Surgeon: Nadara Mustard, MD;  Location: [facility] OR;  Service: Orthopedics;  Laterality: Right;  . BACK SURGERY     Rods and screws in lumbar area  . COLONOSCOPY    . ESOPHAGOGASTRODUODENOSCOPY  multiple  . HERNIA REPAIR     4  . JOINT REPLACEMENT    . TOTAL KNEE ARTHROPLASTY  2009   left    No Known Allergies  Social History   Socioeconomic History  . Marital status: Married    Spouse name: Not on file  . Number of children: 2  . Years of education: Not on file  . Highest education level: Not on file  Occupational History  . Occupation: Technical sales engineer, Disabled  Social Needs  . Financial resource strain: Not on file  . Food insecurity    Worry: Not on file    Inability: Not on file  . Transportation needs    Medical: Not on file    Non-medical: Not on file  Tobacco Use  . Smoking status: Former Smoker    Packs/day: 0.80    Years: 15.00    Pack years: 12.00    Types: Cigarettes    Quit date: 07/16/2004    Years since quitting: 14.9  . Smokeless tobacco: Never Used  Substance  and Sexual Activity  . Alcohol use: No  . Drug use: No    Comment: Prior abuse of narcotics  . Sexual activity: Never  Lifestyle  . Physical activity    Days per week: Not on file    Minutes per session: Not on file  . Stress: Not on file  Relationships  . Social Herbalist on phone: Not on file    Gets together: Not on file    Attends religious service: Not on file    Active member of club or organization: Not on file    Attends meetings of clubs or organizations: Not on file    Relationship status: Not on file  . Intimate partner violence    Fear of current or ex partner: Not on file    Emotionally abused: Not on file    Physically abused: Not on file    Forced sexual activity: Not on file  Other Topics Concern  . Not on file  Social History Narrative   Married, 2 daughters born 2003 and 2004. 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 [place]  history of living in working in [place].   6 caffeinated beverages daily.   01/21/2017    Family History  Problem Relation Age of Onset  . Allergies Mother   . Early death Father   . Thyroid disease Sister   . Colon cancer Neg Hx   . Stomach cancer Neg Hx     Review of Systems Constitutional: negative for anorexia, fevers and sweats  Eyes: negative for irritation, redness and visual disturbance  Ears, nose, mouth, throat, and face: negative for earaches, epistaxis, nasal congestion and sore throat  Respiratory: negative for cough, dyspnea on exertion, sputum and wheezing  Cardiovascular: negative for chest pain, dyspnea, lower extremity edema, orthopnea, palpitations and syncope  Gastrointestinal: negative for abdominal pain, constipation, diarrhea, melena, nausea and vomiting  Genitourinary:negative for dysuria, frequency and hematuria  Hematologic/lymphatic: negative for bleeding, easy bruising and lymphadenopathy  Musculoskeletal:negative for arthralgias, muscle weakness,  stiff joints + Neurological: negative for coordination problems, gait problems, headaches and weakness  Endocrine: negative for diabetic symptoms including polydipsia, polyuria and weight loss      Objective:   Physical Exam  Gen. Pleasant, obese, in no distress, normal affect ENT - no pallor,icterus, no post nasal drip, class 2-3 airway Neck: No JVD, no thyromegaly, no carotid bruits Lungs: no use of accessory muscles, no dullness to percussion, decreased without rales or rhonchi  Cardiovascular: Rhythm regular, heart sounds  normal, no murmurs or gallops, no peripheral edema Abdomen: soft and non-tender, no hepatosplenomegaly, BS normal. Musculoskeletal: No deformities, no cyanosis or clubbing Neuro:  alert, non focal, no tremors        Assessment & Plan:

## 2019-06-26 ENCOUNTER — Other Ambulatory Visit: Payer: Self-pay | Admitting: Family Medicine

## 2019-06-26 DIAGNOSIS — E118 Type 2 diabetes mellitus with unspecified complications: Secondary | ICD-10-CM

## 2019-06-26 DIAGNOSIS — M159 Polyosteoarthritis, unspecified: Secondary | ICD-10-CM

## 2019-06-30 DIAGNOSIS — G4733 Obstructive sleep apnea (adult) (pediatric): Secondary | ICD-10-CM | POA: Diagnosis not present

## 2019-07-08 ENCOUNTER — Encounter: Payer: Self-pay | Admitting: Family Medicine

## 2019-07-08 ENCOUNTER — Other Ambulatory Visit: Payer: Self-pay

## 2019-07-08 ENCOUNTER — Ambulatory Visit (INDEPENDENT_AMBULATORY_CARE_PROVIDER_SITE_OTHER): Payer: Medicare Other | Admitting: Family Medicine

## 2019-07-08 VITALS — Ht 73.0 in | Wt 335.0 lb

## 2019-07-08 DIAGNOSIS — E118 Type 2 diabetes mellitus with unspecified complications: Secondary | ICD-10-CM

## 2019-07-08 NOTE — Progress Notes (Signed)
No charge.  Patient was not seen.

## 2019-07-16 ENCOUNTER — Other Ambulatory Visit: Payer: Self-pay | Admitting: Family Medicine

## 2019-07-16 DIAGNOSIS — M159 Polyosteoarthritis, unspecified: Secondary | ICD-10-CM

## 2019-07-18 ENCOUNTER — Other Ambulatory Visit: Payer: Self-pay | Admitting: Family Medicine

## 2019-07-18 DIAGNOSIS — E118 Type 2 diabetes mellitus with unspecified complications: Secondary | ICD-10-CM

## 2019-07-19 ENCOUNTER — Other Ambulatory Visit: Payer: Self-pay | Admitting: Family Medicine

## 2019-07-19 DIAGNOSIS — M159 Polyosteoarthritis, unspecified: Secondary | ICD-10-CM

## 2019-07-31 DIAGNOSIS — G4733 Obstructive sleep apnea (adult) (pediatric): Secondary | ICD-10-CM | POA: Diagnosis not present

## 2019-08-05 ENCOUNTER — Other Ambulatory Visit: Payer: Self-pay | Admitting: Family Medicine

## 2019-08-05 DIAGNOSIS — E1159 Type 2 diabetes mellitus with other circulatory complications: Secondary | ICD-10-CM

## 2019-08-13 ENCOUNTER — Other Ambulatory Visit: Payer: Self-pay | Admitting: Family Medicine

## 2019-08-13 ENCOUNTER — Telehealth: Payer: Self-pay

## 2019-08-13 DIAGNOSIS — E118 Type 2 diabetes mellitus with unspecified complications: Secondary | ICD-10-CM

## 2019-08-13 NOTE — Telephone Encounter (Signed)
Please call Robert [name] to schedule appt for Medicare Wellness with Dr. Sharee Holster.  Will need to be in office as this will be his initial Medicare Wellness.  No further refills until Robert [name] is seen.  Tiajuana Amass, CMA

## 2019-08-17 ENCOUNTER — Other Ambulatory Visit: Payer: Self-pay | Admitting: Family Medicine

## 2019-08-17 DIAGNOSIS — E1159 Type 2 diabetes mellitus with other circulatory complications: Secondary | ICD-10-CM

## 2019-08-17 DIAGNOSIS — M159 Polyosteoarthritis, unspecified: Secondary | ICD-10-CM

## 2019-08-24 ENCOUNTER — Other Ambulatory Visit: Payer: Self-pay | Admitting: Family Medicine

## 2019-08-24 DIAGNOSIS — G894 Chronic pain syndrome: Secondary | ICD-10-CM | POA: Diagnosis not present

## 2019-08-24 DIAGNOSIS — Z79899 Other long term (current) drug therapy: Secondary | ICD-10-CM | POA: Diagnosis not present

## 2019-08-24 DIAGNOSIS — M545 Low back pain: Secondary | ICD-10-CM | POA: Diagnosis not present

## 2019-08-24 DIAGNOSIS — M47816 Spondylosis without myelopathy or radiculopathy, lumbar region: Secondary | ICD-10-CM | POA: Diagnosis not present

## 2019-08-24 DIAGNOSIS — E118 Type 2 diabetes mellitus with unspecified complications: Secondary | ICD-10-CM

## 2019-08-24 DIAGNOSIS — M25569 Pain in unspecified knee: Secondary | ICD-10-CM | POA: Diagnosis not present

## 2019-08-24 DIAGNOSIS — Z79891 Long term (current) use of opiate analgesic: Secondary | ICD-10-CM | POA: Diagnosis not present

## 2019-08-31 DIAGNOSIS — G4733 Obstructive sleep apnea (adult) (pediatric): Secondary | ICD-10-CM | POA: Diagnosis not present

## 2019-09-02 ENCOUNTER — Other Ambulatory Visit: Payer: Self-pay | Admitting: Family Medicine

## 2019-09-02 DIAGNOSIS — E118 Type 2 diabetes mellitus with unspecified complications: Secondary | ICD-10-CM

## 2019-09-21 NOTE — Progress Notes (Deleted)
_0  ID: Robert [name], male    DOB: 1956-03-05, 64 y.o.   MRN: [id number]  No chief complaint on file.   Referring provider: [name], DO  HPI:  64 year old male former smoker followed in our office for restrictive lung disease as well as obstructive sleep apnea  PMH: Chronic back pain, obesity, neuropathy, hypertension, type 2 diabetes, hyperlipidemia Smoker/ Smoking History: Former smoker Maintenance:   Pt of: Dr. Elsworth Soho  09/21/2019  - Visit     Questionaires / Pulmonary Flowsheets:   ACT:  No flowsheet data found.  MMRC: No flowsheet data found.  Epworth:  Results of the Epworth flowsheet 06/24/2019  Sitting and reading 0  Watching TV 1  Sitting, inactive in a public place (e.g. a theatre or a meeting) 0  As a passenger in a car for an hour without a break 1  Lying down to rest in the afternoon when circumstances permit 3  Sitting and talking to someone 0  Sitting quietly after a lunch without alcohol 2  In a car, while stopped for a few minutes in traffic 0  Total score 7    Tests:   02/15/2012-pulmonary function test-FVC 4.99 (94% predicted), postbronchodilator ratio 76, postbronchodilator FEV1 3.70 (99% predicted), no bronchodilator response, ERV 41, DLCO 25.2 (73% predicted)  FENO:  No results found for: NITRICOXIDE  PFT: No flowsheet data found.  WALK:  No flowsheet data found.  Imaging: No results found.  Lab Results:  CBC    Component Value Date/Time   WBC 6.3 06/12/2018 0228   RBC 3.89 (L) 06/12/2018 0228   HGB 11.7 (L) 06/12/2018 0228   HGB 12.7 (L) 04/28/2018 1016   HCT 33.6 (L) 06/12/2018 0228   HCT 35.7 (L) 04/28/2018 1016   PLT 195 06/12/2018 0228   PLT 207 04/28/2018 1016   MCV 86.4 06/12/2018 0228   MCV 88 04/28/2018 1016   MCH 30.1 06/12/2018 0228   MCHC 34.8 06/12/2018 0228   RDW 12.4 06/12/2018 0228   RDW 13.0 04/28/2018 1016   LYMPHSABS 0.7 06/12/2018 0228   LYMPHSABS 1.3 04/28/2018 1016   MONOABS 0.1  06/12/2018 0228   EOSABS 0.1 06/12/2018 0228   EOSABS 0.2 04/28/2018 1016   BASOSABS 0.0 06/12/2018 0228   BASOSABS 0.0 04/28/2018 1016    BMET    Component Value Date/Time   NA 133 (L) 06/12/2018 0228   NA 143 04/28/2018 1016   K 4.4 06/12/2018 0228   CL 104 06/12/2018 0228   CO2 23 06/12/2018 0228   GLUCOSE 159 (H) 06/12/2018 0228   BUN 13 06/12/2018 0228   BUN 13 04/28/2018 1016   CREATININE 0.90 06/12/2018 0228   CREATININE 0.81 04/25/2016 0846   CALCIUM 8.9 06/12/2018 0228   GFRNONAA >60 06/12/2018 0228   GFRNONAA >89 04/25/2016 0846   GFRAA >60 06/12/2018 0228   GFRAA >89 04/25/2016 0846    BNP No results found for: BNP  ProBNP No results found for: PROBNP  Specialty Problems      Pulmonary Problems   OSA on CPAP- seen Dr Melvyn Novas in past   Restrictive lung disease secondary to obesity      No Known Allergies  Immunization History  Administered Date(s) Administered  . Fluad Quad(high Dose 65+) 04/07/2019  . Influenza,inj,Quad PF,6+ Mos 05/08/2016, 05/09/2017, 04/10/2018  . Tdap 05/09/2017    Past Medical History:  Diagnosis Date  . Allergic rhinitis due to pollen   . Arthritis   . Asthma   . Back  pain   . Barrett esophagus   . Depressive disorder   . Diabetes mellitus type 2 in obese (HCC)   . Exposure to hepatitis B   . Exposure to hepatitis C   . Narcotic abuse (HCC)    pain medications  . OSA (obstructive sleep apnea)    on CPAP     Tobacco History: Social History   Tobacco Use  Smoking Status Former Smoker  . Packs/day: 0.80  . Years: 15.00  . Pack years: 12.00  . Types: Cigarettes  . Quit date: 07/16/2004  . Years since quitting: 15.1  Smokeless Tobacco Never Used   Counseling given: Not Answered   Continue to not smoke  Outpatient Encounter Medications as of 09/22/2019  Medication Sig  . APPLE CIDER VINEGAR PO Take 15 mLs by mouth 2 (two) times daily.   . atorvastatin (LIPITOR) 20 MG tablet TAKE 1 TABLET BY MOUTH EVERYDAY AT  BEDTIME (Patient taking differently: Take 20 mg by mouth daily at 6 PM. )  . B Complex Vitamins (B COMPLEX 100 PO) Take 1 capsule by mouth every other day.   . blood glucose meter kit and supplies KIT Dispense based on patient and insurance preference. Use up to four times daily as directed. (FOR ICD-9 250.00, 250.01).  . blood glucose meter kit and supplies Dispense based on patient and insurance preference. Use to check glucose fasting in the AM and then after largest meal of the day.. (FOR ICD-10 E10.9, E11.9).  . Blood Glucose Monitoring Suppl (ONE TOUCH ULTRA MINI) w/Device KIT Use to test blood sugar, test in the morning fasting and test after largest meal of the day.  . CALCIUM-MAGNESIUM-ZINC PO Take 1 tablet by mouth every other day.  . Cholecalciferol (VITAMIN D3) 5000 units TABS 5,000 IU OTC vitamin D3 daily. (Patient taking differently: Take 5,000 Units by mouth daily. )  . cyclobenzaprine (FLEXERIL) 10 MG tablet TAKE 1 TABLET BY MOUTH TWICE A DAY  . DANDELION PO Take 1 tablet by mouth every other day.  . glucose blood (ONE TOUCH ULTRA TEST) test strip Use to test blood sugar, test in the morning fasting and test after largest meal of the day.  . meloxicam (MOBIC) 15 MG tablet Take 1 tablet (15 mg total) by mouth daily.  . metFORMIN (GLUCOPHAGE) 500 MG tablet TAKE 2 TABLETS BY MOUTH TWICE A DAY  . NARCAN 4 MG/0.1ML LIQD nasal spray kit 1 SPRAY AS NEEDED  . Olmesartan-amLODIPine-HCTZ 20-5-12.5 MG TABS TAKE 1 TABLET BY MOUTH EVERY DAY  . ONETOUCH DELICA LANCETS 33G MISC Use for testing blood sugar, test once in the morning fasting and test after largest meal of the day.  . Potassium 99 MG TABS Take 99 mg by mouth every other day.   . traMADol (ULTRAM) 50 MG tablet Take 2 tablets (100 mg total) by mouth every 6 (six) hours as needed. For pain  . TURMERIC CURCUMIN PO Take 1 tablet by mouth every other day.  . valACYclovir (VALTREX) 1000 MG tablet Take 1 tablet (1,000 mg total) by mouth daily  as needed (for outbreaks only).  . VENTOLIN HFA 108 (90 Base) MCG/ACT inhaler INHALE 1-2 PUFFS INTO THE LUNGS EVERY 6 (SIX) HOURS AS NEEDED FOR WHEEZING OR SHORTNESS OF BREATH.  . vitamin B-12 1000 MCG tablet Take 1 tablet (1,000 mcg total) by mouth daily.  . vitamin C (ASCORBIC ACID) 500 MG tablet Take 500 mg by mouth daily.  . VITAMIN E PO Take 1 tablet by mouth   daily.   No facility-administered encounter medications on file as of 09/22/2019.     Review of Systems  Review of Systems   Physical Exam  There were no vitals taken for this visit.  Wt Readings from Last 5 Encounters:  07/08/19 (!) 335 lb (152 kg)  06/24/19 (!) 347 lb 12.8 oz (157.8 kg)  05/06/19 (!) 346 lb (156.9 kg)  12/03/18 (!) 350 lb (158.8 kg)  10/14/18 (!) 345 lb (156.5 kg)    BMI Readings from Last 5 Encounters:  07/08/19 44.20 kg/m  06/24/19 45.89 kg/m  05/06/19 45.65 kg/m  12/03/18 44.94 kg/m  10/14/18 44.30 kg/m     Physical Exam    Assessment & Plan:   No problem-specific Assessment & Plan notes found for this encounter.    No follow-ups on file.    P , NP 09/21/2019   This appointment required *** minutes of patient care (this includes precharting, chart review, review of results, face-to-face care, etc.). 

## 2019-09-22 ENCOUNTER — Ambulatory Visit: Payer: Medicare Other | Admitting: Pulmonary Disease

## 2019-09-24 ENCOUNTER — Other Ambulatory Visit: Payer: Self-pay

## 2019-09-24 NOTE — Patient Outreach (Signed)
[facility]) Care Management  09/24/2019  Robert [name] [birth year] [id number]   Medication Adherence call to Robert [name] HIPPA Compliant Voice message left with a call back number. Robert [name] is showing past due on Metformin 500 mg under [employer].  Lillia Abed CPhT Pharmacy Technician [facility] Management Direct Dial [phone]  Fax [phone] [email or web address]

## 2019-09-27 ENCOUNTER — Other Ambulatory Visit: Payer: Self-pay | Admitting: Family Medicine

## 2019-09-27 DIAGNOSIS — E118 Type 2 diabetes mellitus with unspecified complications: Secondary | ICD-10-CM

## 2019-09-28 ENCOUNTER — Telehealth: Payer: Self-pay | Admitting: Family Medicine

## 2019-09-28 DIAGNOSIS — G4733 Obstructive sleep apnea (adult) (pediatric): Secondary | ICD-10-CM | POA: Diagnosis not present

## 2019-09-28 DIAGNOSIS — E118 Type 2 diabetes mellitus with unspecified complications: Secondary | ICD-10-CM

## 2019-09-28 MED ORDER — METFORMIN HCL 500 MG PO TABS: 1000.0000 mg | ORAL_TABLET | Freq: Two times a day (BID) | ORAL | 0 refills | Status: DC

## 2019-09-28 NOTE — Telephone Encounter (Signed)
[name].  Thank you for looking into this and making sure his up coming appointment is scheduled appropriately.  I appreciate it.

## 2019-09-28 NOTE — Telephone Encounter (Signed)
Robert [name]/ Robert [name]- If pt was already billed a medicare appt, we can't do another one within 365 days of the first correct?  Thus, it will have to be changed to a chronic dx f/up OV   [name],  Also, ok to give him  30d of Metformin until seen next ov.   last seen on 05/06/19 A1c was 5.9 then.  Thanks!!

## 2019-09-28 NOTE — Telephone Encounter (Signed)
30 day supply of Metformin sent to [facility] on [address] AS, CMA

## 2019-09-28 NOTE — Telephone Encounter (Signed)
Patient was given 30 day supply and 15 day supply of meds per refill protocol. Patient is now out of metformin. He now also has apt scheduled for 10/21/19. Ok for refill?

## 2019-09-28 NOTE — Telephone Encounter (Signed)
Pt called states he is out of Metformin and request refill-- see CMA note 1/28 stating pt needs Medicare Wellness exam (Epic shows pt had one 12/23) but MWE was suppose to be a ([employer] in office appt)   --Forwarding message to med asst that WELCOME TO MEDICARE EXAM RESCHEDULED FOR 10/21/19 IN OFFICE APPOINTMENT)---  ---Pt is out of & needs a Urgent refill  :  metFORMIN (GLUCOPHAGE) 500 MG tablet [[id number]]   Order Details Dose, Route, Frequency: As Directed  Dispense Quantity: 60 tablet Refills: 0       Sig: TAKE 2 TABLETS BY MOUTH TWICE A DAY   --Forwarding request to med asst to sent approval order to :   [id number] [place], [place] - [address] [phone] (Phone) [phone] (Fax)   --glh

## 2019-10-14 DIAGNOSIS — M9905 Segmental and somatic dysfunction of pelvic region: Secondary | ICD-10-CM | POA: Diagnosis not present

## 2019-10-14 DIAGNOSIS — M9902 Segmental and somatic dysfunction of thoracic region: Secondary | ICD-10-CM | POA: Diagnosis not present

## 2019-10-14 DIAGNOSIS — M461 Sacroiliitis, not elsewhere classified: Secondary | ICD-10-CM | POA: Diagnosis not present

## 2019-10-14 DIAGNOSIS — M5116 Intervertebral disc disorders with radiculopathy, lumbar region: Secondary | ICD-10-CM | POA: Diagnosis not present

## 2019-10-14 DIAGNOSIS — M9904 Segmental and somatic dysfunction of sacral region: Secondary | ICD-10-CM | POA: Diagnosis not present

## 2019-10-20 ENCOUNTER — Other Ambulatory Visit: Payer: Self-pay | Admitting: Family Medicine

## 2019-10-20 DIAGNOSIS — E118 Type 2 diabetes mellitus with unspecified complications: Secondary | ICD-10-CM

## 2019-10-21 ENCOUNTER — Telehealth: Payer: Self-pay | Admitting: Family Medicine

## 2019-10-21 ENCOUNTER — Encounter: Payer: Self-pay | Admitting: Family Medicine

## 2019-10-21 ENCOUNTER — Other Ambulatory Visit: Payer: Self-pay | Admitting: Family Medicine

## 2019-10-21 ENCOUNTER — Other Ambulatory Visit: Payer: Self-pay

## 2019-10-21 ENCOUNTER — Ambulatory Visit (INDEPENDENT_AMBULATORY_CARE_PROVIDER_SITE_OTHER): Payer: Medicare Other | Admitting: Family Medicine

## 2019-10-21 VITALS — Ht 70.87 in | Wt 345.8 lb

## 2019-10-21 DIAGNOSIS — M159 Polyosteoarthritis, unspecified: Secondary | ICD-10-CM

## 2019-10-21 DIAGNOSIS — Z136 Encounter for screening for cardiovascular disorders: Secondary | ICD-10-CM

## 2019-10-21 DIAGNOSIS — Z23 Encounter for immunization: Secondary | ICD-10-CM

## 2019-10-21 DIAGNOSIS — Z87891 Personal history of nicotine dependence: Secondary | ICD-10-CM | POA: Diagnosis not present

## 2019-10-21 DIAGNOSIS — Z0000 Encounter for general adult medical examination without abnormal findings: Secondary | ICD-10-CM

## 2019-10-21 DIAGNOSIS — E1159 Type 2 diabetes mellitus with other circulatory complications: Secondary | ICD-10-CM

## 2019-10-21 MED ORDER — MELOXICAM 15 MG PO TABS: 15.0000 mg | ORAL_TABLET | Freq: Every day | ORAL | 0 refills | Status: DC

## 2019-10-21 MED ORDER — PNEUMOVAX 23 25 MCG/0.5ML IJ INJ: 0.5000 mL | INJECTION | INTRAMUSCULAR | 0 refills | Status: DC

## 2019-10-21 MED ORDER — CYCLOBENZAPRINE HCL 10 MG PO TABS: 10.0000 mg | ORAL_TABLET | Freq: Two times a day (BID) | ORAL | 0 refills | Status: DC

## 2019-10-21 NOTE — Patient Instructions (Signed)
Please look into hearing aid evaluation and purchase through [employer].    Preventive Care 66 Years and Older, Male Preventive care refers to lifestyle choices and visits with your health care provider that can promote health and wellness. What does preventive care include?   A yearly physical exam. This is also called an annual well check.  Dental exams once or twice a year.  Routine eye exams. Ask your health care provider how often you should have your eyes checked.  Personal lifestyle choices, including: ? Daily care of your teeth and gums. ? Regular physical activity. ? Eating a healthy diet. ? Avoiding tobacco and drug use. ? Limiting alcohol use. ? Practicing safe sex. ? Taking low doses of aspirin every day. ? Taking vitamin and mineral supplements as recommended by your health care provider. What happens during an annual well check? The services and screenings done by your health care provider during your annual well check will depend on your age, overall health, lifestyle risk factors, and family history of disease. Counseling Your health care provider may ask you questions about your:  Alcohol use.  Tobacco use.  Drug use.  Emotional well-being.  Home and relationship well-being.  Sexual activity.  Eating habits.  History of falls.  Memory and ability to understand (cognition).  Work and work environment. Screening You may have the following tests or measurements:  Height, weight, and BMI.  Blood pressure.  Lipid and cholesterol levels. These may be checked every 5 years, or more frequently if you are over 50 years old.  Skin check.  Lung cancer screening. You may have this screening every year starting at age 55 if you have a 30-pack-year history of smoking and currently smoke or have quit within the past 15 years.  Colorectal cancer screening. All adults should have this screening starting at age 50 and continuing until age 75. You will have  tests every 1-10 years, depending on your results and the type of screening test. People at increased risk should start screening at an earlier age. Screening tests may include: ? Guaiac-based fecal occult blood testing. ? Fecal immunochemical test (FIT). ? Stool DNA test. ? Virtual colonoscopy. ? Sigmoidoscopy. During this test, a flexible tube with a tiny camera (sigmoidoscope) is used to examine your rectum and lower colon. The sigmoidoscope is inserted through your anus into your rectum and lower colon. ? Colonoscopy. During this test, a long, thin, flexible tube with a tiny camera (colonoscope) is used to examine your entire colon and rectum.  Prostate cancer screening. Recommendations will vary depending on your family history and other risks.  Hepatitis C blood test.  Hepatitis B blood test.  Sexually transmitted disease (STD) testing.  Diabetes screening. This is done by checking your blood sugar (glucose) after you have not eaten for a while (fasting). You may have this done every 1-3 years.  Abdominal aortic aneurysm (AAA) screening. You may need this if you are a current or former smoker.  Osteoporosis. You may be screened starting at age 70 if you are at high risk. Talk with your health care provider about your test results, treatment options, and if necessary, the need for more tests. Vaccines Your health care provider may recommend certain vaccines, such as:  Influenza vaccine. This is recommended every year.  Tetanus, diphtheria, and acellular pertussis (Tdap, Td) vaccine. You may need a Td booster every 10 years.  Varicella vaccine. You may need this if you have not been vaccinated.  Zoster vaccine. You   may need this after age 60.  Measles, mumps, and rubella (MMR) vaccine. You may need at least one dose of MMR if you were born in 1957 or later. You may also need a second dose.  Pneumococcal 13-valent conjugate (PCV13) vaccine. One dose is recommended after age  65.  Pneumococcal polysaccharide (PPSV23) vaccine. One dose is recommended after age 65.  Meningococcal vaccine. You may need this if you have certain conditions.  Hepatitis A vaccine. You may need this if you have certain conditions or if you travel or work in places where you may be exposed to hepatitis A.  Hepatitis B vaccine. You may need this if you have certain conditions or if you travel or work in places where you may be exposed to hepatitis B.  Haemophilus influenzae type b (Hib) vaccine. You may need this if you have certain risk factors. Talk to your health care provider about which screenings and vaccines you need and how often you need them. This information is not intended to replace advice given to you by your health care provider. Make sure you discuss any questions you have with your health care provider. Document Released: 07/29/2015 Document Revised: 08/22/2017 Document Reviewed: 05/03/2015 Elsevier Interactive Patient Education  2019 [employer]        Preventive Care for Adults, Male A healthy lifestyle and preventive care can promote health and wellness. Preventive health guidelines for men include the following key practices:  A routine yearly physical is a good way to check with your health care provider about your health and preventative screening. It is a chance to share any concerns and updates on your health and to receive a thorough exam.  Visit your dentist for a routine exam and preventative care every 6 months. Brush your teeth twice a day and floss once a day. Good oral hygiene prevents tooth decay and gum disease.  The frequency of eye exams is based on your age, health, family medical history, use of contact lenses, and other factors. Follow your health care provider's recommendations for frequency of eye exams.  Eat a healthy diet. Foods such as vegetables, fruits, whole grains, low-fat dairy products, and lean protein foods contain the nutrients  you need without too many calories. Decrease your intake of foods high in solid fats, added sugars, and salt. Eat the right amount of calories for you. Get information about a proper diet from your health care provider, if necessary.  Regular physical exercise is one of the most important things you can do for your health. Most adults should get at least 150 minutes of moderate-intensity exercise (any activity that increases your heart rate and causes you to sweat) each week. In addition, most adults need muscle-strengthening exercises on 2 or more days a week.  Maintain a healthy weight. The body mass index (BMI) is a screening tool to identify possible weight problems. It provides an estimate of body fat based on height and weight. Your health care provider can find your BMI and can help you achieve or maintain a healthy weight. For adults 20 years and older:  A BMI below 18.5 is considered underweight.  A BMI of 18.5 to 24.9 is normal.  A BMI of 25 to 29.9 is considered overweight.  A BMI of 30 and above is considered obese.  Maintain normal blood lipids and cholesterol levels by exercising and minimizing your intake of saturated fat. Eat a balanced diet with plenty of fruit and vegetables. Blood tests for lipids and cholesterol   should begin at age 20 and be repeated every 5 years. If your lipid or cholesterol levels are high, you are over 50, or you are at high risk for heart disease, you may need your cholesterol levels checked more frequently. Ongoing high lipid and cholesterol levels should be treated with medicines if diet and exercise are not working.  If you smoke, find out from your health care provider how to quit. If you do not use tobacco, do not start.  Lung cancer screening is recommended for adults aged 55-80 years who are at high risk for developing lung cancer because of a history of smoking. A yearly low-dose CT scan of the lungs is recommended for people who have at least a  30-pack-year history of smoking and are a current smoker or have quit within the past 15 years. A pack year of smoking is smoking an average of 1 pack of cigarettes a day for 1 year (for example: 1 pack a day for 30 years or 2 packs a day for 15 years). Yearly screening should continue until the smoker has stopped smoking for at least 15 years. Yearly screening should be stopped for people who develop a health problem that would prevent them from having lung cancer treatment.  If you choose to drink alcohol, do not have more than 2 drinks per day. One drink is considered to be 12 ounces (355 mL) of beer, 5 ounces (148 mL) of wine, or 1.5 ounces (44 mL) of liquor.  Avoid use of street drugs. Do not share needles with anyone. Ask for help if you need support or instructions about stopping the use of drugs.  High blood pressure causes heart disease and increases the risk of stroke. Your blood pressure should be checked at least every 1-2 years. Ongoing high blood pressure should be treated with medicines, if weight loss and exercise are not effective.  If you are 45-79 years old, ask your health care provider if you should take aspirin to prevent heart disease.  Diabetes screening is done by taking a blood sample to check your blood glucose level after you have not eaten for a certain period of time (fasting). If you are not overweight and you do not have risk factors for diabetes, you should be screened once every 3 years starting at age 45. If you are overweight or obese and you are 40-70 years of age, you should be screened for diabetes every year as part of your cardiovascular risk assessment.  Colorectal cancer can be detected and often prevented. Most routine colorectal cancer screening begins at the age of 50 and continues through age 75. However, your health care provider may recommend screening at an earlier age if you have risk factors for colon cancer. On a yearly basis, your health care provider  may provide home test kits to check for hidden blood in the stool. Use of a small camera at the end of a tube to directly examine the colon (sigmoidoscopy or colonoscopy) can detect the earliest forms of colorectal cancer. Talk to your health care provider about this at age 50, when routine screening begins. Direct exam of the colon should be repeated every 5-10 years through age 75, unless early forms of precancerous polyps or small growths are found.  People who are at an increased risk for hepatitis B should be screened for this virus. You are considered at high risk for hepatitis B if:  You were born in a country where hepatitis B occurs often.   Talk with your health care provider about which countries are considered high risk.  Your parents were born in a high-risk country and you have not received a shot to protect against hepatitis B (hepatitis B vaccine).  You have HIV or AIDS.  You use needles to inject street drugs.  You live with, or have sex with, someone who has hepatitis B.  You are a man who has sex with other men (MSM).  You get hemodialysis treatment.  You take certain medicines for conditions such as cancer, organ transplantation, and autoimmune conditions.  Hepatitis C blood testing is recommended for all people born from 1945 through 1965 and any individual with known risks for hepatitis C.  Practice safe sex. Use condoms and avoid high-risk sexual practices to reduce the spread of sexually transmitted infections (STIs). STIs include gonorrhea, chlamydia, syphilis, trichomonas, herpes, HPV, and human immunodeficiency virus (HIV). Herpes, HIV, and HPV are viral illnesses that have no cure. They can result in disability, cancer, and death.  If you are a man who has sex with other men, you should be screened at least once per year for:  HIV.  Urethral, rectal, and pharyngeal infection of gonorrhea, chlamydia, or both.  If you are at risk of being infected with HIV, it is  recommended that you take a prescription medicine daily to prevent HIV infection. This is called preexposure prophylaxis (PrEP). You are considered at risk if:  You are a man who has sex with other men (MSM) and have other risk factors.  You are a heterosexual man, are sexually active, and are at increased risk for HIV infection.  You take drugs by injection.  You are sexually active with a partner who has HIV.  Talk with your health care provider about whether you are at high risk of being infected with HIV. If you choose to begin PrEP, you should first be tested for HIV. You should then be tested every 3 months for as long as you are taking PrEP.  A one-time screening for abdominal aortic aneurysm (AAA) and surgical repair of large AAAs by ultrasound are recommended for men ages 65 to 75 years who are current or former smokers.  Healthy men should no longer receive prostate-specific antigen (PSA) blood tests as part of routine cancer screening. Talk with your health care provider about prostate cancer screening.  Testicular cancer screening is not recommended for adult males who have no symptoms. Screening includes self-exam, a health care provider exam, and other screening tests. Consult with your health care provider about any symptoms you have or any concerns you have about testicular cancer.  Use sunscreen. Apply sunscreen liberally and repeatedly throughout the day. You should seek shade when your shadow is shorter than you. Protect yourself by wearing long sleeves, pants, a wide-brimmed hat, and sunglasses year round, whenever you are outdoors.  Once a month, do a whole-body skin exam, using a mirror to look at the skin on your back. Tell your health care provider about new moles, moles that have irregular borders, moles that are larger than a pencil eraser, or moles that have changed in shape or color.  Stay current with required vaccines (immunizations).  Influenza vaccine. All  adults should be immunized every year.  Tetanus, diphtheria, and acellular pertussis (Td, Tdap) vaccine. An adult who has not previously received Tdap or who does not know his vaccine status should receive 1 dose of Tdap. This initial dose should be followed by tetanus and diphtheria toxoids (Td)   booster doses every 10 years. Adults with an unknown or incomplete history of completing a 3-dose immunization series with Td-containing vaccines should begin or complete a primary immunization series including a Tdap dose. Adults should receive a Td booster every 10 years.  Varicella vaccine. An adult without evidence of immunity to varicella should receive 2 doses or a second dose if he has previously received 1 dose.  Human papillomavirus (HPV) vaccine. Males aged 11-21 years who have not received the vaccine previously should receive the 3-dose series. Males aged 22-26 years may be immunized. Immunization is recommended through the age of 26 years for any male who has sex with males and did not get any or all doses earlier. Immunization is recommended for any person with an immunocompromised condition through the age of 26 years if he did not get any or all doses earlier. During the 3-dose series, the second dose should be obtained 4-8 weeks after the first dose. The third dose should be obtained 24 weeks after the first dose and 16 weeks after the second dose.  Zoster vaccine. One dose is recommended for adults aged 60 years or older unless certain conditions are present.  Measles, mumps, and rubella (MMR) vaccine. Adults born before 1957 generally are considered immune to measles and mumps. Adults born in 1957 or later should have 1 or more doses of MMR vaccine unless there is a contraindication to the vaccine or there is laboratory evidence of immunity to each of the three diseases. A routine second dose of MMR vaccine should be obtained at least 28 days after the first dose for students attending  postsecondary schools, health care workers, or international travelers. People who received inactivated measles vaccine or an unknown type of measles vaccine during 1963-1967 should receive 2 doses of MMR vaccine. People who received inactivated mumps vaccine or an unknown type of mumps vaccine before 1979 and are at high risk for mumps infection should consider immunization with 2 doses of MMR vaccine. Unvaccinated health care workers born before 1957 who lack laboratory evidence of measles, mumps, or rubella immunity or laboratory confirmation of disease should consider measles and mumps immunization with 2 doses of MMR vaccine or rubella immunization with 1 dose of MMR vaccine.  Pneumococcal 13-valent conjugate (PCV13) vaccine. When indicated, a person who is uncertain of his immunization history and has no record of immunization should receive the PCV13 vaccine. All adults 65 years of age and older should receive this vaccine. An adult aged 19 years or older who has certain medical conditions and has not been previously immunized should receive 1 dose of PCV13 vaccine. This PCV13 should be followed with a dose of pneumococcal polysaccharide (PPSV23) vaccine. Adults who are at high risk for pneumococcal disease should obtain the PPSV23 vaccine at least 8 weeks after the dose of PCV13 vaccine. Adults older than 65 years of age who have normal immune system function should obtain the PPSV23 vaccine dose at least 1 year after the dose of PCV13 vaccine.  Pneumococcal polysaccharide (PPSV23) vaccine. When PCV13 is also indicated, PCV13 should be obtained first. All adults aged 65 years and older should be immunized. An adult younger than age 65 years who has certain medical conditions should be immunized. Any person who resides in a nursing home or long-term care facility should be immunized. An adult smoker should be immunized. People with an immunocompromised condition and certain other conditions should receive  both PCV13 and PPSV23 vaccines. People with human immunodeficiency virus (HIV) infection   should be immunized as soon as possible after diagnosis. Immunization during chemotherapy or radiation therapy should be avoided. Routine use of PPSV23 vaccine is not recommended for American Indians, [place] Natives, or people younger than 65 years unless there are medical conditions that require PPSV23 vaccine. When indicated, people who have unknown immunization and have no record of immunization should receive PPSV23 vaccine. One-time revaccination 5 years after the first dose of PPSV23 is recommended for people aged 19-64 years who have chronic kidney failure, nephrotic syndrome, asplenia, or immunocompromised conditions. People who received 1-2 doses of PPSV23 before age 65 years should receive another dose of PPSV23 vaccine at age 65 years or later if at least 5 years have passed since the previous dose. Doses of PPSV23 are not needed for people immunized with PPSV23 at or after age 65 years.  Meningococcal vaccine. Adults with asplenia or persistent complement component deficiencies should receive 2 doses of quadrivalent meningococcal conjugate (MenACWY-D) vaccine. The doses should be obtained at least 2 months apart. Microbiologists working with certain meningococcal bacteria, military recruits, people at risk during an outbreak, and people who travel to or live in countries with a high rate of meningitis should be immunized. A first-year college student up through age 21 years who is living in a residence hall should receive a dose if he did not receive a dose on or after his 16th birthday. Adults who have certain high-risk conditions should receive one or more doses of vaccine.  Hepatitis A vaccine. Adults who wish to be protected from this disease, have chronic liver disease, work with hepatitis A-infected animals, work in hepatitis A research labs, or travel to or work in countries with a high rate of hepatitis A  should be immunized. Adults who were previously unvaccinated and who anticipate close contact with an international adoptee during the first 60 days after arrival in the [place] from a country with a high rate of hepatitis A should be immunized.  Hepatitis B vaccine. Adults should be immunized if they wish to be protected from this disease, are under age 59 years and have diabetes, have chronic liver disease, have had more than one sex partner in the past 6 months, may be exposed to blood or other infectious body fluids, are household contacts or sex partners of hepatitis B positive people, are clients or workers in certain care facilities, or travel to or work in countries with a high rate of hepatitis B.  Haemophilus influenzae type b (Hib) vaccine. A previously unvaccinated person with asplenia or sickle cell disease or having a scheduled splenectomy should receive 1 dose of Hib vaccine. Regardless of previous immunization, a recipient of a hematopoietic stem cell transplant should receive a 3-dose series 6-12 months after his successful transplant. Hib vaccine is not recommended for adults with HIV infection. Preventive Service / Frequency Ages 19 to 39  Blood pressure check.** / Every 3-5 years.  Lipid and cholesterol check.** / Every 5 years beginning at age 20.  Hepatitis C blood test.** / For any individual with known risks for hepatitis C.  Skin self-exam. / Monthly.  Influenza vaccine. / Every year.  Tetanus, diphtheria, and acellular pertussis (Tdap, Td) vaccine.** / Consult your health care provider. 1 dose of Td every 10 years.  Varicella vaccine.** / Consult your health care provider.  HPV vaccine. / 3 doses over 6 months, if 26 or younger.  Measles, mumps, rubella (MMR) vaccine.** / You need at least 1 dose of MMR if you were   born in 1957 or later. You may also need a second dose.  Pneumococcal 13-valent conjugate (PCV13) vaccine.** / Consult your health care  provider.  Pneumococcal polysaccharide (PPSV23) vaccine.** / 1 to 2 doses if you smoke cigarettes or if you have certain conditions.  Meningococcal vaccine.** / 1 dose if you are age 19 to 21 years and a first-year college student living in a residence hall, or have one of several medical conditions. You may also need additional booster doses.  Hepatitis A vaccine.** / Consult your health care provider.  Hepatitis B vaccine.** / Consult your health care provider.  Haemophilus influenzae type b (Hib) vaccine.** / Consult your health care provider. Ages 40 to 64  Blood pressure check.** / Every year.  Lipid and cholesterol check.** / Every 5 years beginning at age 20.  Lung cancer screening. / Every year if you are aged 55-80 years and have a 30-pack-year history of smoking and currently smoke or have quit within the past 15 years. Yearly screening is stopped once you have quit smoking for at least 15 years or develop a health problem that would prevent you from having lung cancer treatment.  Fecal occult blood test (FOBT) of stool. / Every year beginning at age 50 and continuing until age 75. You may not have to do this test if you get a colonoscopy every 10 years.  Flexible sigmoidoscopy** or colonoscopy.** / Every 5 years for a flexible sigmoidoscopy or every 10 years for a colonoscopy beginning at age 50 and continuing until age 75.  Hepatitis C blood test.** / For all people born from 1945 through 1965 and any individual with known risks for hepatitis C.  Skin self-exam. / Monthly.  Influenza vaccine. / Every year.  Tetanus, diphtheria, and acellular pertussis (Tdap/Td) vaccine.** / Consult your health care provider. 1 dose of Td every 10 years.  Varicella vaccine.** / Consult your health care provider.  Zoster vaccine.** / 1 dose for adults aged 60 years or older.  Measles, mumps, rubella (MMR) vaccine.** / You need at least 1 dose of MMR if you were born in 1957 or later. You  may also need a second dose.  Pneumococcal 13-valent conjugate (PCV13) vaccine.** / Consult your health care provider.  Pneumococcal polysaccharide (PPSV23) vaccine.** / 1 to 2 doses if you smoke cigarettes or if you have certain conditions.  Meningococcal vaccine.** / Consult your health care provider.  Hepatitis A vaccine.** / Consult your health care provider.  Hepatitis B vaccine.** / Consult your health care provider.  Haemophilus influenzae type b (Hib) vaccine.** / Consult your health care provider. Ages 65 and over  Blood pressure check.** / Every year.  Lipid and cholesterol check.**/ Every 5 years beginning at age 20.  Lung cancer screening. / Every year if you are aged 55-80 years and have a 30-pack-year history of smoking and currently smoke or have quit within the past 15 years. Yearly screening is stopped once you have quit smoking for at least 15 years or develop a health problem that would prevent you from having lung cancer treatment.  Fecal occult blood test (FOBT) of stool. / Every year beginning at age 50 and continuing until age 75. You may not have to do this test if you get a colonoscopy every 10 years.  Flexible sigmoidoscopy** or colonoscopy.** / Every 5 years for a flexible sigmoidoscopy or every 10 years for a colonoscopy beginning at age 50 and continuing until age 75.  Hepatitis C blood test.** / For   all people born from [birth year] through 1967 and any individual with known risks for hepatitis C.  Abdominal aortic aneurysm (AAA) screening.** / A one-time screening for ages 60 to 48 years who are current or former smokers.  Skin self-exam. / Monthly.  Influenza vaccine. / Every year.  Tetanus, diphtheria, and acellular pertussis (Tdap/Td) vaccine.** / 1 dose of Td every 10 years.  Varicella vaccine.** / Consult your health care provider.  Zoster vaccine.** / 1 dose for adults aged 50 years or older.  Pneumococcal 13-valent conjugate (PCV13) vaccine.** / 1  dose for all adults aged 56 years and older.  Pneumococcal polysaccharide (PPSV23) vaccine.** / 1 dose for all adults aged 84 years and older.  Meningococcal vaccine.** / Consult your health care provider.  Hepatitis A vaccine.** / Consult your health care provider.  Hepatitis B vaccine.** / Consult your health care provider.  Haemophilus influenzae type b (Hib) vaccine.** / Consult your health care provider. **Family history and personal history of risk and conditions may change your health care provider's recommendations.   This information is not intended to replace advice given to you by your health care provider. Make sure you discuss any questions you have with your health care provider.   Document Released: 08/28/2001 Document Revised: 07/23/2014 Document Reviewed: 11/27/2010 Elsevier Interactive Patient Education [employer].

## 2019-10-21 NOTE — Telephone Encounter (Signed)
Refill sent to pharmacy. AS, CMA 

## 2019-10-21 NOTE — Addendum Note (Signed)
Addended by: Sylvester Harder on: 10/21/2019 04:58 PM   Modules accepted: Orders

## 2019-10-21 NOTE — Telephone Encounter (Signed)
Patient was just seen for a OV today and forgot to mention he needs refill on his BP med, metformin, cyclobenzaprine, and meloxicam. [facility]  [address]
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
## 2019-10-22 DIAGNOSIS — G894 Chronic pain syndrome: Secondary | ICD-10-CM | POA: Diagnosis not present

## 2019-10-22 DIAGNOSIS — M545 Low back pain: Secondary | ICD-10-CM | POA: Diagnosis not present

## 2019-10-22 DIAGNOSIS — M25569 Pain in unspecified knee: Secondary | ICD-10-CM | POA: Diagnosis not present

## 2019-10-22 DIAGNOSIS — Z79891 Long term (current) use of opiate analgesic: Secondary | ICD-10-CM | POA: Diagnosis not present

## 2019-10-22 DIAGNOSIS — M47816 Spondylosis without myelopathy or radiculopathy, lumbar region: Secondary | ICD-10-CM | POA: Diagnosis not present

## 2019-10-22 DIAGNOSIS — Z79899 Other long term (current) drug therapy: Secondary | ICD-10-CM | POA: Diagnosis not present

## 2019-10-26 ENCOUNTER — Ambulatory Visit
Admission: RE | Admit: 2019-10-26 | Discharge: 2019-10-26 | Disposition: A | Discharge status: Home / Self Care | Payer: Medicare Other | Source: Ambulatory Visit | Attending: Family Medicine | Admitting: Family Medicine

## 2019-10-26 DIAGNOSIS — Z87891 Personal history of nicotine dependence: Secondary | ICD-10-CM

## 2019-10-26 DIAGNOSIS — Z136 Encounter for screening for cardiovascular disorders: Secondary | ICD-10-CM

## 2019-10-29 ENCOUNTER — Other Ambulatory Visit: Payer: Medicare Other

## 2019-10-29 DIAGNOSIS — G4733 Obstructive sleep apnea (adult) (pediatric): Secondary | ICD-10-CM | POA: Diagnosis not present

## 2019-11-02 ENCOUNTER — Other Ambulatory Visit: Payer: Self-pay

## 2019-11-02 ENCOUNTER — Other Ambulatory Visit: Payer: Medicare Other

## 2019-11-02 DIAGNOSIS — D61818 Other pancytopenia: Secondary | ICD-10-CM

## 2019-11-02 DIAGNOSIS — E118 Type 2 diabetes mellitus with unspecified complications: Secondary | ICD-10-CM

## 2019-11-02 DIAGNOSIS — E1159 Type 2 diabetes mellitus with other circulatory complications: Secondary | ICD-10-CM | POA: Diagnosis not present

## 2019-11-02 DIAGNOSIS — E559 Vitamin D deficiency, unspecified: Secondary | ICD-10-CM

## 2019-11-02 DIAGNOSIS — I152 Hypertension secondary to endocrine disorders: Secondary | ICD-10-CM

## 2019-11-02 DIAGNOSIS — Z0000 Encounter for general adult medical examination without abnormal findings: Secondary | ICD-10-CM

## 2019-11-02 DIAGNOSIS — E1169 Type 2 diabetes mellitus with other specified complication: Secondary | ICD-10-CM

## 2019-11-02 DIAGNOSIS — I10 Essential (primary) hypertension: Secondary | ICD-10-CM | POA: Diagnosis not present

## 2019-11-03 LAB — COMPREHENSIVE METABOLIC PANEL
ALT: 36 IU/L (ref 0–44)
AST: 25 IU/L (ref 0–40)
Albumin/Globulin Ratio: 1.7 (ref 1.2–2.2)
Albumin: 4.3 g/dL (ref 3.8–4.8)
Alkaline Phosphatase: 77 IU/L (ref 39–117)
BUN/Creatinine Ratio: 20 (ref 10–24)
BUN: 17 mg/dL (ref 8–27)
Bilirubin Total: 0.3 mg/dL (ref 0.0–1.2)
CO2: 25 mmol/L (ref 20–29)
Calcium: 9.8 mg/dL (ref 8.6–10.2)
Chloride: 100 mmol/L (ref 96–106)
Creatinine, Ser: 0.83 mg/dL (ref 0.76–1.27)
GFR calc Af Amer: 107 mL/min/{1.73_m2} (ref >59)
GFR calc non Af Amer: 92 mL/min/{1.73_m2} (ref >59)
Globulin, Total: 2.5 g/dL (ref 1.5–4.5)
Glucose: 136 mg/dL — ABNORMAL HIGH (ref 65–99)
Potassium: 4.4 mmol/L (ref 3.5–5.2)
Sodium: 140 mmol/L (ref 134–144)
Total Protein: 6.8 g/dL (ref 6.0–8.5)

## 2019-11-03 LAB — CBC WITH DIFFERENTIAL/PLATELET
Basophils Absolute: 0 x10E3/uL (ref 0.0–0.2)
Basos: 1 %
EOS (ABSOLUTE): 0.2 x10E3/uL (ref 0.0–0.4)
Eos: 5 %
Hematocrit: 38.1 % (ref 37.5–51.0)
Hemoglobin: 12.9 g/dL — ABNORMAL LOW (ref 13.0–17.7)
Immature Grans (Abs): 0 x10E3/uL (ref 0.0–0.1)
Immature Granulocytes: 1 %
Lymphocytes Absolute: 0.9 x10E3/uL (ref 0.7–3.1)
Lymphs: 24 %
MCH: 31 pg (ref 26.6–33.0)
MCHC: 33.9 g/dL (ref 31.5–35.7)
MCV: 92 fL (ref 79–97)
Monocytes Absolute: 0.6 x10E3/uL (ref 0.1–0.9)
Monocytes: 14 %
Neutrophils Absolute: 2.2 x10E3/uL (ref 1.4–7.0)
Neutrophils: 55 %
Platelets: 192 x10E3/uL (ref 150–450)
RBC: 4.16 x10E6/uL (ref 4.14–5.80)
RDW: 12.9 % (ref 11.6–15.4)
WBC: 3.9 x10E3/uL (ref 3.4–10.8)

## 2019-11-03 LAB — VITAMIN D 25 HYDROXY (VIT D DEFICIENCY, FRACTURES): Vit D, 25-Hydroxy: 27.3 ng/mL — ABNORMAL LOW (ref 30.0–100.0)

## 2019-11-03 LAB — LIPID PANEL
Chol/HDL Ratio: 4.4 ratio (ref 0.0–5.0)
Cholesterol, Total: 154 mg/dL (ref 100–199)
HDL: 35 mg/dL — ABNORMAL LOW
LDL Chol Calc (NIH): 95 mg/dL (ref 0–99)
Triglycerides: 136 mg/dL (ref 0–149)
VLDL Cholesterol Cal: 24 mg/dL (ref 5–40)

## 2019-11-03 LAB — T3: T3, Total: 115 ng/dL (ref 71–180)

## 2019-11-03 LAB — HEMOGLOBIN A1C
Est. average glucose Bld gHb Est-mCnc: 128 mg/dL
Hgb A1c MFr Bld: 6.1 % — ABNORMAL HIGH (ref 4.8–5.6)

## 2019-11-03 LAB — T4, FREE: Free T4: 1.1 ng/dL (ref 0.82–1.77)

## 2019-11-03 LAB — TSH: TSH: 1.57 u[IU]/mL (ref 0.450–4.500)

## 2019-11-04 DIAGNOSIS — M792 Neuralgia and neuritis, unspecified: Secondary | ICD-10-CM | POA: Diagnosis not present

## 2019-11-04 DIAGNOSIS — G894 Chronic pain syndrome: Secondary | ICD-10-CM | POA: Diagnosis not present

## 2019-11-11 DIAGNOSIS — M461 Sacroiliitis, not elsewhere classified: Secondary | ICD-10-CM | POA: Diagnosis not present

## 2019-11-11 DIAGNOSIS — M9902 Segmental and somatic dysfunction of thoracic region: Secondary | ICD-10-CM | POA: Diagnosis not present

## 2019-11-11 DIAGNOSIS — M5116 Intervertebral disc disorders with radiculopathy, lumbar region: Secondary | ICD-10-CM | POA: Diagnosis not present

## 2019-11-11 DIAGNOSIS — M9904 Segmental and somatic dysfunction of sacral region: Secondary | ICD-10-CM | POA: Diagnosis not present

## 2019-11-11 DIAGNOSIS — M9905 Segmental and somatic dysfunction of pelvic region: Secondary | ICD-10-CM | POA: Diagnosis not present

## 2019-11-25 DIAGNOSIS — M9902 Segmental and somatic dysfunction of thoracic region: Secondary | ICD-10-CM | POA: Diagnosis not present

## 2019-11-25 DIAGNOSIS — M5116 Intervertebral disc disorders with radiculopathy, lumbar region: Secondary | ICD-10-CM | POA: Diagnosis not present

## 2019-11-25 DIAGNOSIS — M461 Sacroiliitis, not elsewhere classified: Secondary | ICD-10-CM | POA: Diagnosis not present

## 2019-11-25 DIAGNOSIS — M9904 Segmental and somatic dysfunction of sacral region: Secondary | ICD-10-CM | POA: Diagnosis not present

## 2019-11-25 DIAGNOSIS — M9905 Segmental and somatic dysfunction of pelvic region: Secondary | ICD-10-CM | POA: Diagnosis not present

## 2019-11-28 DIAGNOSIS — G4733 Obstructive sleep apnea (adult) (pediatric): Secondary | ICD-10-CM | POA: Diagnosis not present

## 2019-12-01 DIAGNOSIS — M9904 Segmental and somatic dysfunction of sacral region: Secondary | ICD-10-CM | POA: Diagnosis not present

## 2019-12-01 DIAGNOSIS — M461 Sacroiliitis, not elsewhere classified: Secondary | ICD-10-CM | POA: Diagnosis not present

## 2019-12-01 DIAGNOSIS — M5116 Intervertebral disc disorders with radiculopathy, lumbar region: Secondary | ICD-10-CM | POA: Diagnosis not present

## 2019-12-01 DIAGNOSIS — M9902 Segmental and somatic dysfunction of thoracic region: Secondary | ICD-10-CM | POA: Diagnosis not present

## 2019-12-01 DIAGNOSIS — M9905 Segmental and somatic dysfunction of pelvic region: Secondary | ICD-10-CM | POA: Diagnosis not present

## 2019-12-12 IMAGING — MR MR [PERSON_NAME] LOW WO/W CM*R*
10 series · 40 of 40 positions shown · IV contrast (Y)
Comparison: Right tibia and fibula x-rays dated June 08, 2018.

CLINICAL DATA: Recurrent right lower leg cellulitis.

EXAM:
MRI OF LOWER RIGHT EXTREMITY WITHOUT AND WITH CONTRAST
TECHNIQUE: Multiplanar, multisequence MR imaging of the right lower leg was
performed both before and after administration of intravenous
contrast.
CONTRAST:  10 mL Gadavist intravenous contrast.

[Series 3: pu:hd bodyfull,obl,2d,fsexl,fast · coronal · 4.5mm · 0.94mm/px · 3 of 30 slices shown]
[im 1/30]
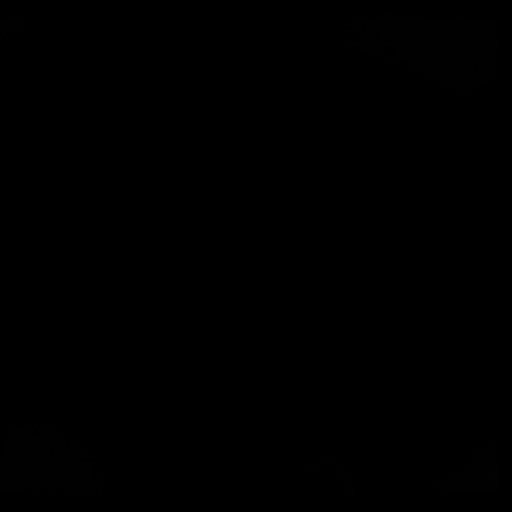
[im 15/30]
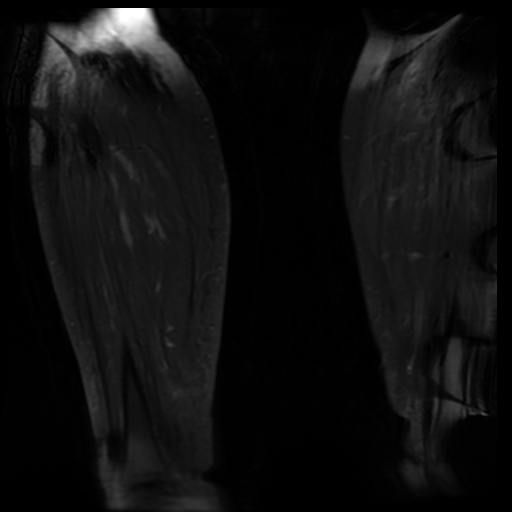
[im 30/30]
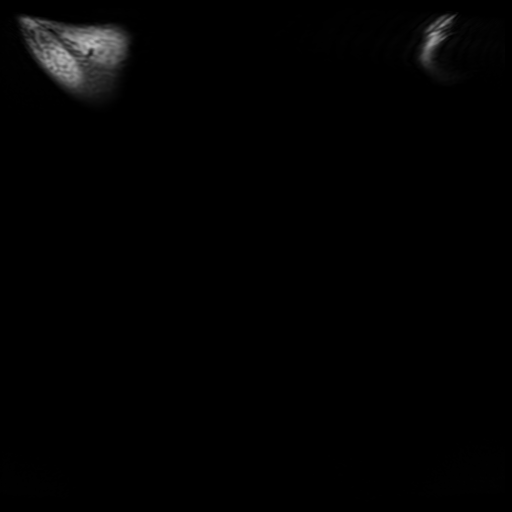

[Series 4: T1 · coronal · 4.5mm · 1.88mm/px · 3 of 44 slices shown (1 of 2)]
[im 1/44]
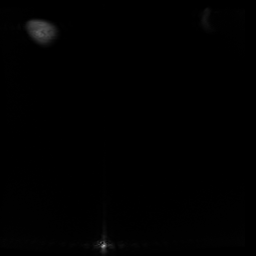
[im 22/44]
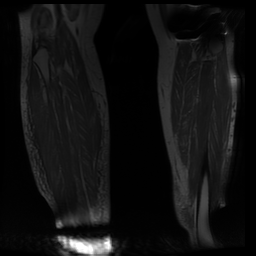
[im 44/44]
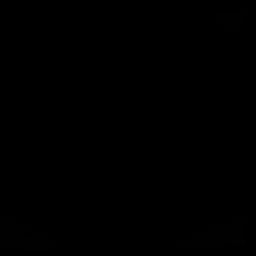

[Series 4: T1 fat-sat post-contrast · axial · 5.0mm · 0.47mm/px · z∈[-281,+188]mm · 5 of 68 slices shown]
[im 1/68]
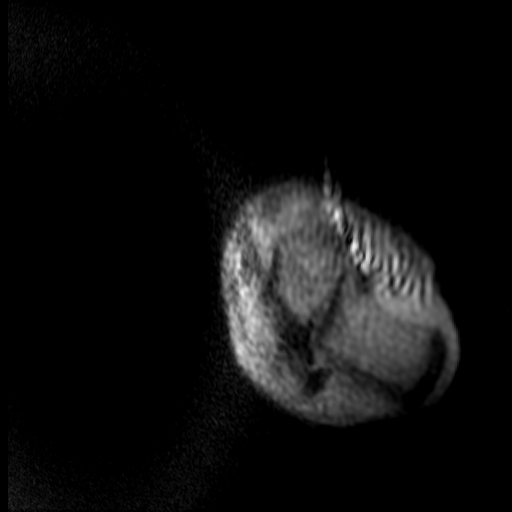
[im 17/68]
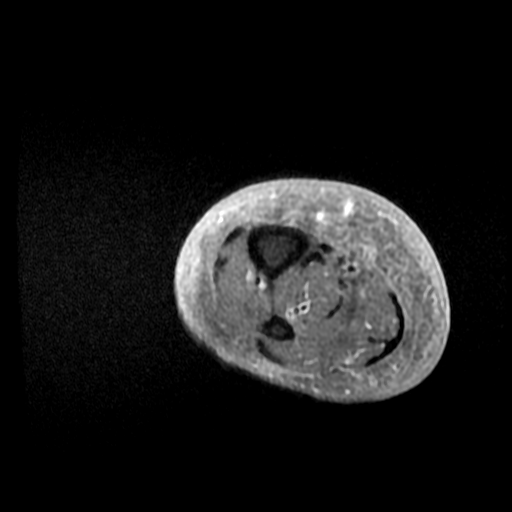
[im 34/68]
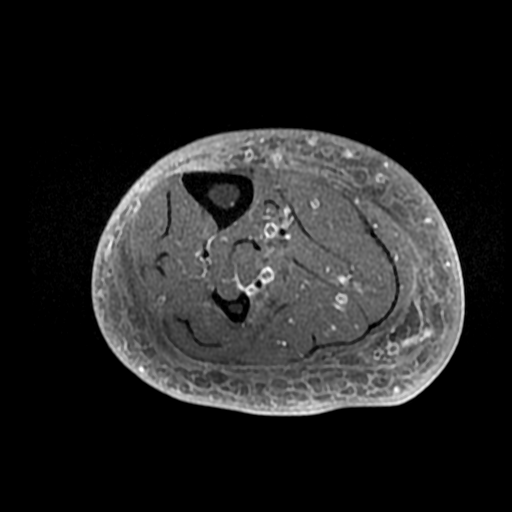
[im 51/68]
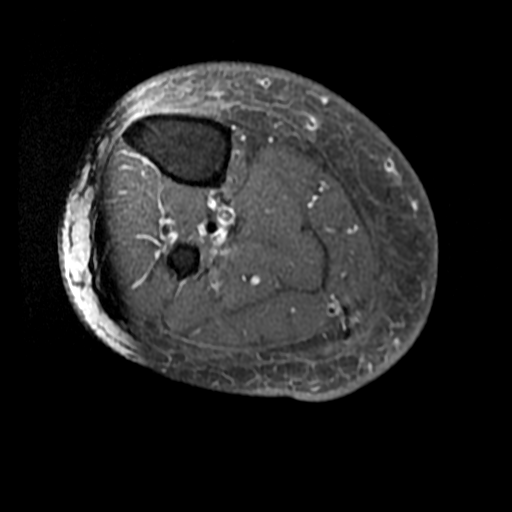
[im 68/68]
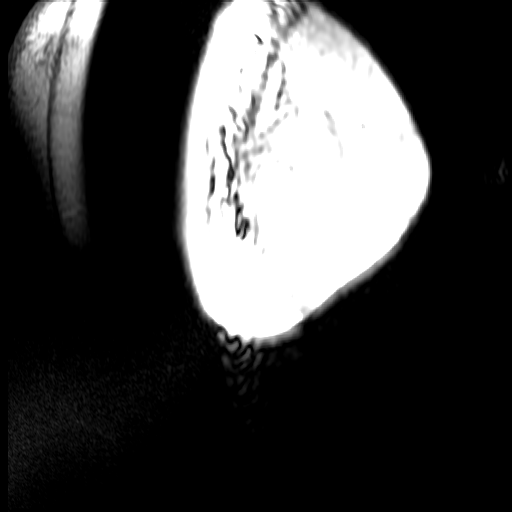

[Series 5: cor fatsat · coronal · 4.5mm · 1.88mm/px · 3 of 44 slices shown]
[im 1/44]
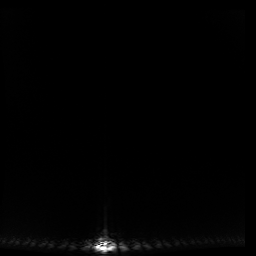
[im 22/44]
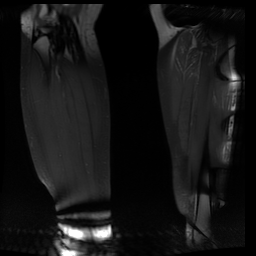
[im 44/44]
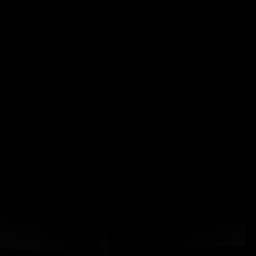

[Series 7: T1 · axial · 5.0mm · 1.09mm/px · z∈[-342,+90]mm · 5 of 68 slices shown (2 of 2)]
[im 1/68]
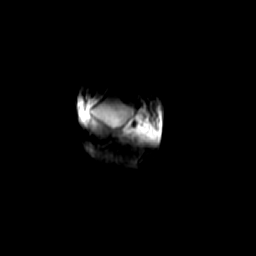
[im 17/68]
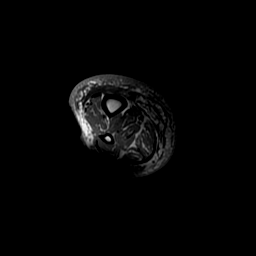
[im 34/68]
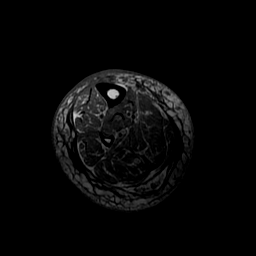
[im 51/68]
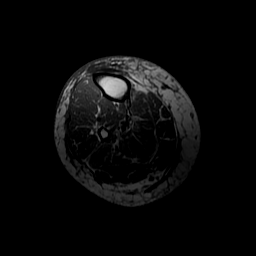
[im 68/68]
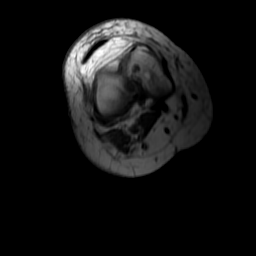

[Series 8: T1 fat-sat · axial · 5.0mm · 1.09mm/px · z∈[-342,+90]mm · 5 of 68 slices shown]
[im 1/68]
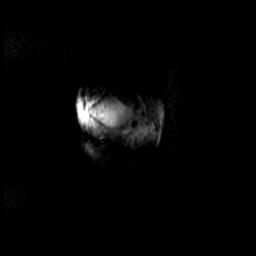
[im 17/68]
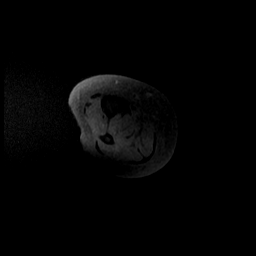
[im 34/68]
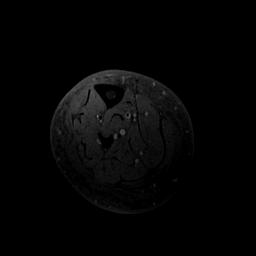
[im 51/68]
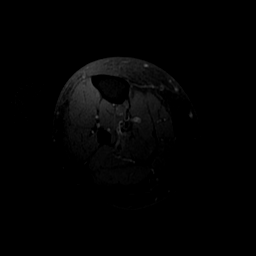
[im 68/68]
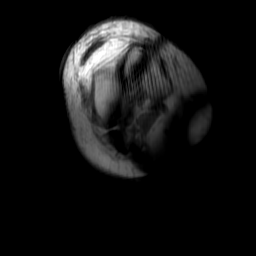

[Series 9: axial fse ir · axial · 5.0mm · 1.09mm/px · z∈[-342,+90]mm · 5 of 68 slices shown]
[im 1/68]
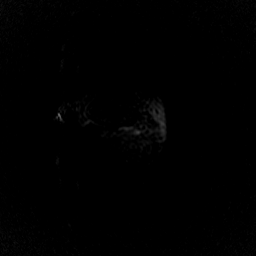
[im 17/68]
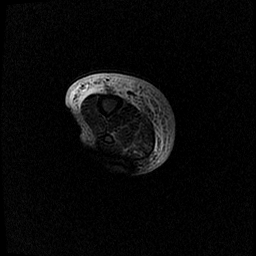
[im 34/68]
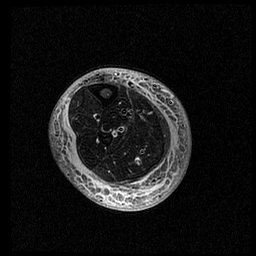
[im 51/68]
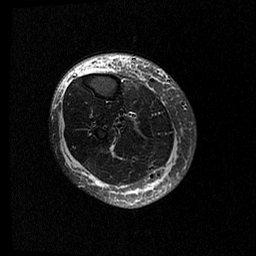
[im 68/68]
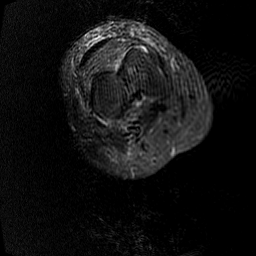

[Series 10: T2 fat-sat · axial · 5.0mm · 1.09mm/px · z∈[-342,+90]mm · 5 of 68 slices shown (1 of 2)]
[im 1/68]
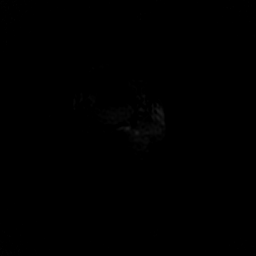
[im 17/68]
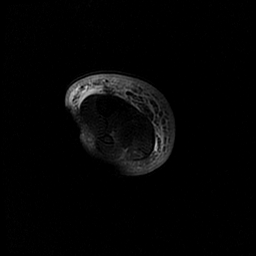
[im 34/68]
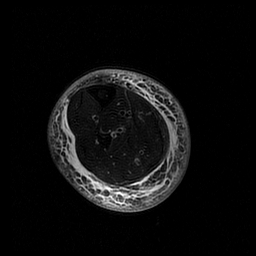
[im 51/68]
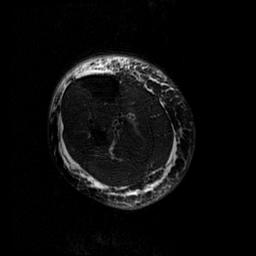
[im 68/68]
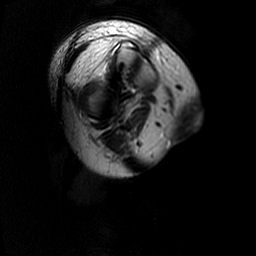

[Series 11: T2 fat-sat · coronal · 4.5mm · 1.88mm/px · 3 of 44 slices shown (2 of 2)]
[im 1/44]
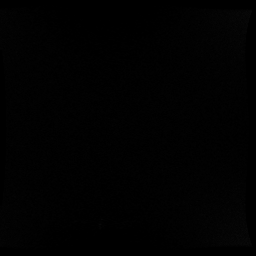
[im 22/44]
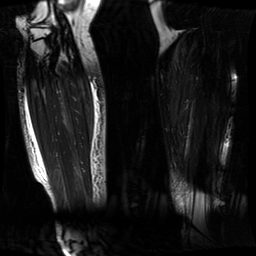
[im 44/44]
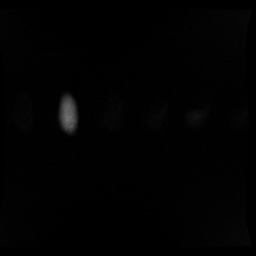

[Series 12: sag fse ir · sagittal · 4.2mm · 1.88mm/px · 3 of 40 slices shown]
[im 1/40]
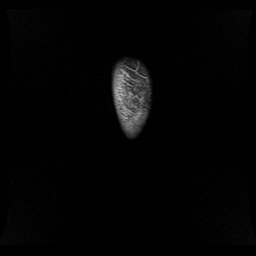
[im 20/40]
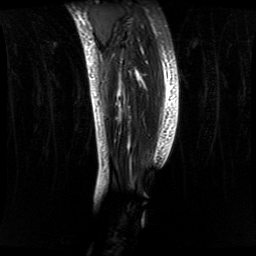
[im 40/40]
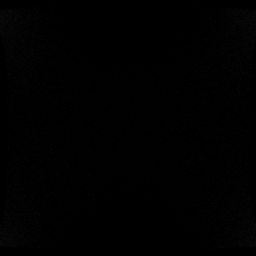

[40 of 40 positions shown; findings below may reference images not displayed]

FINDINGS: Bones/Joint/Cartilage

No marrow signal abnormality. No fracture or dislocation. Normal
alignment. No joint effusion. Mild medial compartment osteoarthritis
of the knee.

Muscles and Tendons
No muscle edema or atrophy.

Soft tissue
Extensive circumferential soft tissue swelling and enhancement of
the mid to distal lower leg. No fluid collection or hematoma. No
soft tissue mass.
IMPRESSION: 1. Extensive circumferential soft tissue swelling and enhancement of
the mid to distal lower leg, consistent with cellulitis.
2. No abscess or myositis.

## 2019-12-22 DIAGNOSIS — G894 Chronic pain syndrome: Secondary | ICD-10-CM | POA: Diagnosis not present

## 2019-12-22 DIAGNOSIS — M47816 Spondylosis without myelopathy or radiculopathy, lumbar region: Secondary | ICD-10-CM | POA: Diagnosis not present

## 2019-12-22 DIAGNOSIS — M545 Low back pain: Secondary | ICD-10-CM | POA: Diagnosis not present

## 2019-12-22 DIAGNOSIS — M25569 Pain in unspecified knee: Secondary | ICD-10-CM | POA: Diagnosis not present

## 2019-12-23 DIAGNOSIS — M9905 Segmental and somatic dysfunction of pelvic region: Secondary | ICD-10-CM | POA: Diagnosis not present

## 2019-12-23 DIAGNOSIS — M9904 Segmental and somatic dysfunction of sacral region: Secondary | ICD-10-CM | POA: Diagnosis not present

## 2019-12-23 DIAGNOSIS — M5116 Intervertebral disc disorders with radiculopathy, lumbar region: Secondary | ICD-10-CM | POA: Diagnosis not present

## 2019-12-23 DIAGNOSIS — M9902 Segmental and somatic dysfunction of thoracic region: Secondary | ICD-10-CM | POA: Diagnosis not present

## 2019-12-23 DIAGNOSIS — M461 Sacroiliitis, not elsewhere classified: Secondary | ICD-10-CM | POA: Diagnosis not present

## 2020-01-08 DIAGNOSIS — M9902 Segmental and somatic dysfunction of thoracic region: Secondary | ICD-10-CM | POA: Diagnosis not present

## 2020-01-08 DIAGNOSIS — M9905 Segmental and somatic dysfunction of pelvic region: Secondary | ICD-10-CM | POA: Diagnosis not present

## 2020-01-08 DIAGNOSIS — M461 Sacroiliitis, not elsewhere classified: Secondary | ICD-10-CM | POA: Diagnosis not present

## 2020-01-08 DIAGNOSIS — M9904 Segmental and somatic dysfunction of sacral region: Secondary | ICD-10-CM | POA: Diagnosis not present

## 2020-01-08 DIAGNOSIS — M5116 Intervertebral disc disorders with radiculopathy, lumbar region: Secondary | ICD-10-CM | POA: Diagnosis not present

## 2020-01-14 ENCOUNTER — Other Ambulatory Visit: Payer: Self-pay | Admitting: Family Medicine

## 2020-01-14 DIAGNOSIS — M159 Polyosteoarthritis, unspecified: Secondary | ICD-10-CM

## 2020-01-14 DIAGNOSIS — E118 Type 2 diabetes mellitus with unspecified complications: Secondary | ICD-10-CM

## 2020-01-19 ENCOUNTER — Telehealth: Payer: Self-pay | Admitting: Physician Assistant

## 2020-01-19 DIAGNOSIS — M5116 Intervertebral disc disorders with radiculopathy, lumbar region: Secondary | ICD-10-CM | POA: Diagnosis not present

## 2020-01-19 DIAGNOSIS — M159 Polyosteoarthritis, unspecified: Secondary | ICD-10-CM

## 2020-01-19 DIAGNOSIS — E118 Type 2 diabetes mellitus with unspecified complications: Secondary | ICD-10-CM

## 2020-01-19 DIAGNOSIS — M9904 Segmental and somatic dysfunction of sacral region: Secondary | ICD-10-CM | POA: Diagnosis not present

## 2020-01-19 DIAGNOSIS — M9905 Segmental and somatic dysfunction of pelvic region: Secondary | ICD-10-CM | POA: Diagnosis not present

## 2020-01-19 DIAGNOSIS — M9902 Segmental and somatic dysfunction of thoracic region: Secondary | ICD-10-CM | POA: Diagnosis not present

## 2020-01-19 DIAGNOSIS — M461 Sacroiliitis, not elsewhere classified: Secondary | ICD-10-CM | POA: Diagnosis not present

## 2020-01-19 DIAGNOSIS — E1159 Type 2 diabetes mellitus with other circulatory complications: Secondary | ICD-10-CM

## 2020-01-19 MED ORDER — MELOXICAM 15 MG PO TABS: 15.0000 mg | ORAL_TABLET | Freq: Every day | ORAL | 0 refills | Status: DC

## 2020-01-19 MED ORDER — OLMESARTAN-AMLODIPINE-HCTZ 20-5-12.5 MG PO TABS: 1.0000 | ORAL_TABLET | Freq: Every day | ORAL | 0 refills | Status: DC

## 2020-01-19 MED ORDER — METFORMIN HCL 500 MG PO TABS: 1000.0000 mg | ORAL_TABLET | Freq: Two times a day (BID) | ORAL | 0 refills | Status: DC

## 2020-01-19 NOTE — Telephone Encounter (Signed)
Please call pt to schedule f/u.  No further refills until pt is seen.  Given 90 day supply only today.  Tiajuana Amass, CMA

## 2020-01-19 NOTE — Addendum Note (Signed)
Addended by: Stan Head on: 01/19/2020 10:15 AM   Modules accepted: Orders

## 2020-01-19 NOTE — Telephone Encounter (Signed)
Patient is requesting a refill of his meloxicam, olmesartan-amlodipine-HCTZ and metformin. If [facility] [address]

## 2020-01-20 ENCOUNTER — Other Ambulatory Visit: Payer: Self-pay | Admitting: Family Medicine

## 2020-01-20 DIAGNOSIS — M159 Polyosteoarthritis, unspecified: Secondary | ICD-10-CM

## 2020-01-26 ENCOUNTER — Telehealth: Payer: Self-pay | Admitting: Physician Assistant

## 2020-01-26 DIAGNOSIS — M159 Polyosteoarthritis, unspecified: Secondary | ICD-10-CM

## 2020-01-26 MED ORDER — CYCLOBENZAPRINE HCL 10 MG PO TABS: 10.0000 mg | ORAL_TABLET | Freq: Two times a day (BID) | ORAL | 0 refills | Status: DC

## 2020-01-26 NOTE — Addendum Note (Signed)
Addended by: Stan Head on: 01/26/2020 03:25 PM   Modules accepted: Orders

## 2020-01-26 NOTE — Telephone Encounter (Signed)
Patient is requesting a refill of his cyclobenzaprine, if approved [facility] [address]

## 2020-01-26 NOTE — Telephone Encounter (Signed)
Please call pt to schedule appt.  No further refills until pt is seen.  T. Jamus Loving, CMA  

## 2020-02-02 ENCOUNTER — Ambulatory Visit (INDEPENDENT_AMBULATORY_CARE_PROVIDER_SITE_OTHER): Payer: Medicare Other | Admitting: Physician Assistant

## 2020-02-02 ENCOUNTER — Other Ambulatory Visit: Payer: Self-pay

## 2020-02-02 ENCOUNTER — Encounter: Payer: Self-pay | Admitting: Physician Assistant

## 2020-02-02 VITALS — BP 131/71 | HR 88 | Temp 97.8°F | Resp 96 | Ht 70.47 in | Wt 350.3 lb

## 2020-02-02 DIAGNOSIS — L97522 Non-pressure chronic ulcer of other part of left foot with fat layer exposed: Secondary | ICD-10-CM | POA: Diagnosis not present

## 2020-02-02 DIAGNOSIS — M79675 Pain in left toe(s): Secondary | ICD-10-CM | POA: Diagnosis not present

## 2020-02-02 DIAGNOSIS — S91105A Unspecified open wound of left lesser toe(s) without damage to nail, initial encounter: Secondary | ICD-10-CM

## 2020-02-02 MED ORDER — DOXYCYCLINE HYCLATE 100 MG PO TABS: 100.0000 mg | ORAL_TABLET | Freq: Two times a day (BID) | ORAL | 0 refills | Status: DC

## 2020-02-02 NOTE — Patient Instructions (Signed)
Osteomyelitis, Adult  Bone infections (osteomyelitis) occur when bacteria or other germs get inside a bone. This can happen if you have an infection in another part of your body that spreads through your blood. Germs from your skin or from outside of your body can also cause this type of infection if you have a wound or a broken bone (fracture) that breaks the skin. Bone infections need to be treated quickly to prevent bone damage and to prevent the infection from spreading to other areas of your body. What are the causes? Most bone infections are caused by bacteria. They can also be caused by other germs, such as viruses and funguses. What increases the risk? You are more likely to develop this condition if you:  Recently had surgery, especially bone or joint surgery.  Have a long-term (chronic) disease, such as: ? Diabetes. ? HIV (human immunodeficiency virus). ? Rheumatoid arthritis. ? Sickle cell anemia. ? Kidney disease that requires dialysis.  Are aged 60 years or older.  Have a condition or take medicines that block or weaken your body's defense system (immune system).  Have a condition that reduces your blood flow.  Have an artificial joint.  Have had a joint or bone repaired with plates or screws (surgical hardware).  Use IV drugs.  Have a central line for IV access.  Have had trauma, such as stepping on a nail or a broken bone that came through the skin. What are the signs or symptoms? Symptoms vary depending on the type and location of your infection. Common symptoms of bone infections include:  Fever and chills.  Skin redness and warmth.  Swelling.  Pain and stiffness.  Drainage of fluid or pus near the infection. How is this diagnosed? This condition may be diagnosed based on:  Your symptoms and medical history.  A physical exam.  Tests, such as: ? A sample of tissue, fluid, or blood taken to be examined under a microscope. ? Pus or discharge swabbed  from a wound for testing to identify germs and to determine what type of medicine will kill them (culture and sensitivity). ? Blood tests.  Imaging studies. These may include: ? X-rays. ? MRI. ? CT scan. ? Bone scan. ? Ultrasound. How is this treated? Treatment for this condition depends on the cause and type of infection. Antibiotic medicines are usually the first treatment for a bone infection. This may be done in a hospital at first. You may have to continue IV antibiotics at home or take antibiotics by mouth for several weeks after that. Other treatments may include surgery to remove:  Dead or dying tissue from a bone.  An infected artificial joint.  Infected plates or screws that were used to repair a broken bone. Follow these instructions at home: Medicines   Take over-the-counter and prescription medicines only as told by your health care provider.  Take your antibiotic medicine as told by your health care provider. Do not stop taking the antibiotic even if you start to feel better.  Follow instructions from your health care provider about how to take IV antibiotics at home. You may need to have a nurse come to your home to give you the IV antibiotics. General instructions   Ask your health care provider if you have any restrictions on your activities.  If directed, put ice on the affected area: ? Put ice in a plastic bag. ? Place a towel between your skin and the bag. ? Leave the ice on for 20   minutes, 2-3 times a day.  Wash your hands often with soap and water. If soap and water are not available, use hand sanitizer.  Do not use any products that contain nicotine or tobacco, such as cigarettes and e-cigarettes. These can delay bone healing. If you need help quitting, ask your health care provider.  Keep all follow-up visits as told by your health care provider. This is important. Contact a health care provider if:  You develop a fever or chills.  You have  redness, warmth, pain, or swelling that returns after treatment. Get help right away if:  You have rapid breathing or you have trouble breathing.  You have chest pain.  You cannot drink fluids or make urine.  The affected area swells, changes color, or turns blue.  You have numbness or severe pain in the affected area. Summary  Bone infections (osteomyelitis) occur when bacteria or other germs get inside a bone.  You may be more likely to get this type of infection if you have a condition, such as diabetes, that lowers your ability to fight infection or increases your chances of getting an infection.  Most bone infections are caused by bacteria. They can also be caused by other germs, such as viruses and funguses.  Treatment for this condition usually starts with taking antibiotics. Further treatment depends on the cause and type of infection. This information is not intended to replace advice given to you by your health care provider. Make sure you discuss any questions you have with your health care provider. Document Revised: 07/18/2017 Document Reviewed: 07/11/2017 Elsevier Patient Education  2020 [employer]  

## 2020-02-02 NOTE — Progress Notes (Signed)
Acute Office Visit  Subjective:    Patient ID: Robert [name], male    DOB: [birth year], 64 y.o.   MRN: [id number]  Chief Complaint  Patient presents with   Toe Injury    HPI Patient is in today for wound underneath second toe of left foot.  Reports he has been applying mupirocin ointment and cleansing with hydrogen peroxide for several months. States he had a callus that came off and then open wound developed.  Wound seems to be better but slowly healing.  Patient has history of osteomyelitis and had a toe amputation of right foot, so does not want current wound to progress. Denies wound drainage, fever or chills. Reports pain but it is usually by the end of the day.  Past Medical History:  Diagnosis Date   Allergic rhinitis due to pollen    Arthritis    Asthma    Back pain    Barrett esophagus    Depressive disorder    Diabetes mellitus type 2 in obese (HCC)    Exposure to hepatitis B    Exposure to hepatitis C    Narcotic abuse (HCC)    pain medications   OSA (obstructive sleep apnea)    on CPAP     Past Surgical History:  Procedure Laterality Date   ABDOMINAL SURGERY     AMPUTATION Right 06/11/2018   Procedure: RIGHT 2ND RAY AMPUTATION;  Surgeon: Newt Minion, MD;  Location: [facility];  Service: Orthopedics;  Laterality: Right;   BACK SURGERY     Rods and screws in lumbar area   COLONOSCOPY     ESOPHAGOGASTRODUODENOSCOPY  multiple   HERNIA REPAIR     6   JOINT REPLACEMENT     TOTAL KNEE ARTHROPLASTY  2009   left    Family History  Problem Relation Age of Onset   Allergies Mother    Early death Father    Thyroid disease Sister    Colon cancer Neg Hx    Stomach cancer Neg Hx     Social History   Socioeconomic History   Marital status: Married    Spouse name: Not on file   Number of children: 2   Years of education: Not on file   Highest education level: Not on file  Occupational History   Occupation: Musician, Disabled   Tobacco Use   Smoking status: Former Smoker    Packs/day: 0.80    Years: 15.00    Pack years: 12.00    Types: Cigarettes    Quit date: 07/16/2004    Years since quitting: 15.5   Smokeless tobacco: Never Used  Vaping Use   Vaping Use: Never used  Substance and Sexual Activity   Alcohol use: No   Drug use: No    Comment: Prior abuse of narcotics   Sexual activity: Never  Other Topics Concern   Not on file  Social History Narrative   Married, 2 daughters born 2003 and 2004. 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 [place] history of living in working in [place].   6 caffeinated beverages daily.   01/21/2017   Social Determinants of Health   Financial Resource Strain:    Difficulty of Paying Living Expenses:   Food Insecurity:    Worried About Charity fundraiser in the Last Year:    Arboriculturist in the Last Year:   Transportation Needs:    Film/video editor (Medical):  Lack of Transportation (Non-Medical):   Physical Activity:    Days of Exercise per Week:    Minutes of Exercise per Session:   Stress:    Feeling of Stress :   Social Connections:    Frequency of Communication with Friends and Family:    Frequency of Social Gatherings with Friends and Family:    Attends Religious Services:    Active Member of Clubs or Organizations:    Attends Music therapist:    Marital Status:   Intimate Partner Violence:    Fear of Current or Ex-Partner:    Emotionally Abused:    Physically Abused:    Sexually Abused:     Outpatient Medications Prior to Visit  Medication Sig Dispense Refill   APPLE CIDER VINEGAR PO Take 15 mLs by mouth 2 (two) times daily.      atorvastatin (LIPITOR) 20 MG tablet TAKE 1 TABLET BY MOUTH EVERYDAY AT BEDTIME (Patient taking differently: Take 20 mg by mouth daily at 6 PM. ) 90 tablet 0   B Complex Vitamins (B COMPLEX 100 PO) Take 1 capsule by mouth every other day.       blood glucose meter kit and supplies KIT Dispense based on patient and insurance preference. Use up to four times daily as directed. (FOR ICD-9 250.00, 250.01). 1 each 0   blood glucose meter kit and supplies Dispense based on patient and insurance preference. Use to check glucose fasting in the AM and then after largest meal of the day.. (FOR ICD-10 E10.9, E11.9). 1 each 0   Blood Glucose Monitoring Suppl (ONE TOUCH ULTRA MINI) w/Device KIT Use to test blood sugar, test in the morning fasting and test after largest meal of the day. 1 each 0   CALCIUM-MAGNESIUM-ZINC PO Take 1 tablet by mouth every other day.     Cholecalciferol (VITAMIN D3) 5000 units TABS 5,000 IU OTC vitamin D3 daily. (Patient taking differently: Take 5,000 Units by mouth daily. ) 90 tablet 3   cyclobenzaprine (FLEXERIL) 10 MG tablet Take 1 tablet (10 mg total) by mouth 2 (two) times daily. 60 tablet 0   DANDELION PO Take 1 tablet by mouth every other day.     glucose blood (ONE TOUCH ULTRA TEST) test strip Use to test blood sugar, test in the morning fasting and test after largest meal of the day. 100 each 11   meloxicam (MOBIC) 15 MG tablet Take 1 tablet (15 mg total) by mouth daily. 90 tablet 0   metFORMIN (GLUCOPHAGE) 500 MG tablet Take 2 tablets (1,000 mg total) by mouth 2 (two) times daily. 360 tablet 0   NARCAN 4 MG/0.1ML LIQD nasal spray kit 1 SPRAY AS NEEDED     Olmesartan-amLODIPine-HCTZ 20-5-12.5 MG TABS Take 1 tablet by mouth daily. 90 tablet 0   ONETOUCH DELICA LANCETS 92E MISC Use for testing blood sugar, test once in the morning fasting and test after largest meal of the day. 100 each 11   Potassium 99 MG TABS Take 99 mg by mouth every other day.      traMADol (ULTRAM) 50 MG tablet Take 2 tablets (100 mg total) by mouth every 6 (six) hours as needed. For pain 30 tablet    TURMERIC CURCUMIN PO Take 1 tablet by mouth every other day.     valACYclovir (VALTREX) 1000 MG tablet TAKE 1 TABLET (1,000 MG  TOTAL) BY MOUTH DAILY AS NEEDED (FOR OUTBREAKS ONLY). 5 tablet 0   VENTOLIN HFA 108 (90 Base) MCG/ACT  inhaler INHALE 1-2 PUFFS INTO THE LUNGS EVERY 6 (SIX) HOURS AS NEEDED FOR WHEEZING OR SHORTNESS OF BREATH. 18 Inhaler 0   vitamin B-12 1000 MCG tablet Take 1 tablet (1,000 mcg total) by mouth daily. 30 tablet 0   vitamin C (ASCORBIC ACID) 500 MG tablet Take 500 mg by mouth daily.     VITAMIN E PO Take 1 tablet by mouth daily.     No facility-administered medications prior to visit.    No Known Allergies  Review of Systems Review of Systems:  A fourteen system review of systems was performed and found to be positive as per HPI.  Objective:    Physical Exam General: Well nourished, in no apparent distress. Eyes: PERRLA, EOMs, conjunctiva clr Resp: Respiratory effort- normal, ECTA B/L w/o W/R/R  Cardio: RRR w/o MRGs. Abdomen: no gross distention. Lymphatics:  less 2 sec cap RF M-sk: Full ROM, good strength, good LE pulses. Skin: Warm, dry. 1 cm x 0.5 cm open wound underneath second toe of left foot with mild erythema w/o warmth. No drainage present and no evidence of bone exposure.   Neuro: Alert, Oriented Psych: Normal affect, Insight and Judgment appropriate.    BP 131/71    Pulse 88    Temp 97.8 F (36.6 C) (Oral)    Resp (!) 96    Ht 5' 10.47" (1.79 m)    Wt (!) 350 lb 4.8 oz (158.9 kg)    BMI 49.59 kg/m  Wt Readings from Last 3 Encounters:  02/02/20 (!) 350 lb 4.8 oz (158.9 kg)  10/21/19 (!) 345 lb 12.8 oz (156.9 kg)  07/08/19 (!) 335 lb (152 kg)    Health Maintenance Due  Topic Date Due   OPHTHALMOLOGY EXAM  Never done   COVID-19 Vaccine (1) Never done   FOOT EXAM  01/10/2019   PNA vac Low Risk Adult (1 of 2 - PCV13) Never done    There are no preventive care reminders to display for this patient.   Lab Results  Component Value Date   TSH 1.570 11/02/2019   Lab Results  Component Value Date   WBC 3.9 11/02/2019   HGB 12.9 (L) 11/02/2019   HCT 38.1  11/02/2019   MCV 92 11/02/2019   PLT 192 11/02/2019   Lab Results  Component Value Date   NA 140 11/02/2019   K 4.4 11/02/2019   CO2 25 11/02/2019   GLUCOSE 136 (H) 11/02/2019   BUN 17 11/02/2019   CREATININE 0.83 11/02/2019   BILITOT 0.3 11/02/2019   ALKPHOS 77 11/02/2019   AST 25 11/02/2019   ALT 36 11/02/2019   PROT 6.8 11/02/2019   ALBUMIN 4.3 11/02/2019   CALCIUM 9.8 11/02/2019   ANIONGAP 6 06/12/2018   Lab Results  Component Value Date   CHOL 154 11/02/2019   Lab Results  Component Value Date   HDL 35 (L) 11/02/2019   Lab Results  Component Value Date   LDLCALC 95 11/02/2019   Lab Results  Component Value Date   TRIG 136 11/02/2019   Lab Results  Component Value Date   CHOLHDL 4.4 11/02/2019   Lab Results  Component Value Date   HGBA1C 6.1 (H) 11/02/2019       Assessment & Plan:   Problem List Items Addressed This Visit    None    Visit Diagnoses    Open wound of second toe of left foot, initial encounter    -  Primary   Relevant Medications  doxycycline (VIBRA-TABS) 100 MG tablet   Other Relevant Orders   Wound culture     Open wound of second toe of left foot, initial encounter: -Discussed with patient starting oral antibiotic due to minimal success with topical antibiotic. Patient agreeable to starting doxycycline. -Continue to keep area clean and dry. -If wound worsens or fails to improve with oral antibiotic instructed patient to notify the clinic and will place orders for imaging. Patient verbalized understanding. -Obtained wound culture today.   Meds ordered this encounter  Medications   doxycycline (VIBRA-TABS) 100 MG tablet    Sig: Take 1 tablet (100 mg total) by mouth 2 (two) times daily.    Dispense:  20 tablet    Refill:  0    Order Specific Question:   Supervising Provider    Answer:   Beatrice Lecher D [[id number]]     Lorrene Reid, PA-C

## 2020-02-08 LAB — WOUND CULTURE

## 2020-02-09 ENCOUNTER — Telehealth: Payer: Self-pay | Admitting: Physician Assistant

## 2020-02-09 DIAGNOSIS — S91105A Unspecified open wound of left lesser toe(s) without damage to nail, initial encounter: Secondary | ICD-10-CM

## 2020-02-09 MED ORDER — LEVOFLOXACIN 750 MG PO TABS: 750.0000 mg | ORAL_TABLET | Freq: Every day | ORAL | 0 refills | Status: DC

## 2020-02-09 NOTE — Telephone Encounter (Signed)
Wound culture resulted positive and changed antibiotic to Levofloxacin 750 mg 1 tablet PO once daily x 10 days. Discontinued doxycycline.  Mayer Masker, PA-C

## 2020-02-23 DIAGNOSIS — Z79899 Other long term (current) drug therapy: Secondary | ICD-10-CM | POA: Diagnosis not present

## 2020-02-23 DIAGNOSIS — M47816 Spondylosis without myelopathy or radiculopathy, lumbar region: Secondary | ICD-10-CM | POA: Diagnosis not present

## 2020-02-23 DIAGNOSIS — M545 Low back pain: Secondary | ICD-10-CM | POA: Diagnosis not present

## 2020-02-23 DIAGNOSIS — Z79891 Long term (current) use of opiate analgesic: Secondary | ICD-10-CM | POA: Diagnosis not present

## 2020-02-23 DIAGNOSIS — M25569 Pain in unspecified knee: Secondary | ICD-10-CM | POA: Diagnosis not present

## 2020-02-23 DIAGNOSIS — G894 Chronic pain syndrome: Secondary | ICD-10-CM | POA: Diagnosis not present

## 2020-02-24 DIAGNOSIS — M5116 Intervertebral disc disorders with radiculopathy, lumbar region: Secondary | ICD-10-CM | POA: Diagnosis not present

## 2020-02-24 DIAGNOSIS — M9904 Segmental and somatic dysfunction of sacral region: Secondary | ICD-10-CM | POA: Diagnosis not present

## 2020-02-24 DIAGNOSIS — M461 Sacroiliitis, not elsewhere classified: Secondary | ICD-10-CM | POA: Diagnosis not present

## 2020-02-24 DIAGNOSIS — M9905 Segmental and somatic dysfunction of pelvic region: Secondary | ICD-10-CM | POA: Diagnosis not present

## 2020-02-24 DIAGNOSIS — M9902 Segmental and somatic dysfunction of thoracic region: Secondary | ICD-10-CM | POA: Diagnosis not present

## 2020-03-02 ENCOUNTER — Ambulatory Visit
Admission: RE | Admit: 2020-03-02 | Discharge: 2020-03-02 | Disposition: A | Discharge status: Home / Self Care | Payer: Medicare Other | Source: Ambulatory Visit | Attending: Physician Assistant | Admitting: Physician Assistant

## 2020-03-02 ENCOUNTER — Other Ambulatory Visit: Payer: Self-pay | Admitting: Physician Assistant

## 2020-03-02 DIAGNOSIS — M47816 Spondylosis without myelopathy or radiculopathy, lumbar region: Secondary | ICD-10-CM

## 2020-03-02 DIAGNOSIS — M47817 Spondylosis without myelopathy or radiculopathy, lumbosacral region: Secondary | ICD-10-CM | POA: Diagnosis not present

## 2020-03-02 DIAGNOSIS — M48061 Spinal stenosis, lumbar region without neurogenic claudication: Secondary | ICD-10-CM | POA: Diagnosis not present

## 2020-03-03 ENCOUNTER — Other Ambulatory Visit: Payer: Self-pay

## 2020-03-03 ENCOUNTER — Ambulatory Visit (INDEPENDENT_AMBULATORY_CARE_PROVIDER_SITE_OTHER): Payer: Medicare Other | Admitting: Physician Assistant

## 2020-03-03 ENCOUNTER — Encounter: Payer: Self-pay | Admitting: Physician Assistant

## 2020-03-03 VITALS — BP 131/62 | HR 90 | Temp 98.4°F | Ht 70.47 in | Wt 347.1 lb

## 2020-03-03 DIAGNOSIS — S91109D Unspecified open wound of unspecified toe(s) without damage to nail, subsequent encounter: Secondary | ICD-10-CM

## 2020-03-03 DIAGNOSIS — E118 Type 2 diabetes mellitus with unspecified complications: Secondary | ICD-10-CM

## 2020-03-07 DIAGNOSIS — M47817 Spondylosis without myelopathy or radiculopathy, lumbosacral region: Secondary | ICD-10-CM | POA: Diagnosis not present

## 2020-03-23 ENCOUNTER — Other Ambulatory Visit: Payer: Self-pay | Admitting: Physician Assistant

## 2020-03-23 ENCOUNTER — Ambulatory Visit: Payer: Medicare Other | Admitting: Internal Medicine

## 2020-03-23 DIAGNOSIS — M159 Polyosteoarthritis, unspecified: Secondary | ICD-10-CM

## 2020-03-23 DIAGNOSIS — M47817 Spondylosis without myelopathy or radiculopathy, lumbosacral region: Secondary | ICD-10-CM | POA: Diagnosis not present

## 2020-03-23 NOTE — Progress Notes (Signed)
Acute Office Visit  Subjective:    Patient ID: Robert [name], male    DOB: [birth year], 64 y.o.   MRN: [id number]  Chief Complaint  Patient presents with  . Toe Injury    HPI Patient is in today for follow-up on toe wound. Pt completed antibiotic and wound seems to be better but slowly healing. Denies increased pain, redness, swelling, or fever. Pt has a history of osteomyelitis of toe of right foot and wants to prevent that from occurring again.   Past Medical History:  Diagnosis Date  . Allergic rhinitis due to pollen   . Arthritis   . Asthma   . Back pain   . Barrett esophagus   . Depressive disorder   . Diabetes mellitus type 2 in obese ([facility])   . Exposure to hepatitis B   . Exposure to hepatitis C   . Narcotic abuse (HCC)    pain medications  . OSA (obstructive sleep apnea)    on CPAP     Past Surgical History:  Procedure Laterality Date  . ABDOMINAL SURGERY    . AMPUTATION Right 06/11/2018   Procedure: RIGHT 2ND RAY AMPUTATION;  Surgeon: Newt Minion, MD;  Location: [facility];  Service: Orthopedics;  Laterality: Right;  . BACK SURGERY     Rods and screws in lumbar area  . COLONOSCOPY    . ESOPHAGOGASTRODUODENOSCOPY  multiple  . HERNIA REPAIR     6  . JOINT REPLACEMENT    . TOTAL KNEE ARTHROPLASTY  2009   left    Family History  Problem Relation Age of Onset  . Allergies Mother   . Early death Father   . Thyroid disease Sister   . Colon cancer Neg Hx   . Stomach cancer Neg Hx     Social History   Socioeconomic History  . Marital status: Married    Spouse name: Not on file  . Number of children: 2  . Years of education: Not on file  . Highest education level: Not on file  Occupational History  . Occupation: Musician, Disabled  Tobacco Use  . Smoking status: Former Smoker    Packs/day: 0.80    Years: 15.00    Pack years: 12.00    Types: Cigarettes    Quit date: 07/16/2004    Years since quitting: 15.6  . Smokeless tobacco: Never Used  Vaping  Use  . Vaping Use: Never used  Substance and Sexual Activity  . Alcohol use: No  . Drug use: No    Comment: Prior abuse of narcotics  . Sexual activity: Never  Other Topics Concern  . Not on file  Social History Narrative   Married, 2 daughters born 2003 and 2004. 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 [place] history of living in working in [place].   6 caffeinated beverages daily.   01/21/2017   Social Determinants of Health   Financial Resource Strain:   . Difficulty of Paying Living Expenses: Not on file  Food Insecurity:   . Worried About Charity fundraiser in the Last Year: Not on file  . Ran Out of Food in the Last Year: Not on file  Transportation Needs:   . Lack of Transportation (Medical): Not on file  . Lack of Transportation (Non-Medical): Not on file  Physical Activity:   . Days of Exercise per Week: Not on file  . Minutes of Exercise per Session: Not on file  Stress:   .  Feeling of Stress : Not on file  Social Connections:   . Frequency of Communication with Friends and Family: Not on file  . Frequency of Social Gatherings with Friends and Family: Not on file  . Attends Religious Services: Not on file  . Active Member of Clubs or Organizations: Not on file  . Attends Archivist Meetings: Not on file  . Marital Status: Not on file  Intimate Partner Violence:   . Fear of Current or Ex-Partner: Not on file  . Emotionally Abused: Not on file  . Physically Abused: Not on file  . Sexually Abused: Not on file    Outpatient Medications Prior to Visit  Medication Sig Dispense Refill  . APPLE CIDER VINEGAR PO Take 15 mLs by mouth 2 (two) times daily.     Marland Kitchen atorvastatin (LIPITOR) 20 MG tablet TAKE 1 TABLET BY MOUTH EVERYDAY AT BEDTIME (Patient taking differently: Take 20 mg by mouth daily at 6 PM. ) 90 tablet 0  . B Complex Vitamins (B COMPLEX 100 PO) Take 1 capsule by mouth every other day.     . blood glucose meter kit and  supplies KIT Dispense based on patient and insurance preference. Use up to four times daily as directed. (FOR ICD-9 250.00, 250.01). 1 each 0  . blood glucose meter kit and supplies Dispense based on patient and insurance preference. Use to check glucose fasting in the AM and then after largest meal of the day.. (FOR ICD-10 E10.9, E11.9). 1 each 0  . Blood Glucose Monitoring Suppl (ONE TOUCH ULTRA MINI) w/Device KIT Use to test blood sugar, test in the morning fasting and test after largest meal of the day. 1 each 0  . CALCIUM-MAGNESIUM-ZINC PO Take 1 tablet by mouth every other day.    . Cholecalciferol (VITAMIN D3) 5000 units TABS 5,000 IU OTC vitamin D3 daily. (Patient taking differently: Take 5,000 Units by mouth daily. ) 90 tablet 3  . DANDELION PO Take 1 tablet by mouth every other day.    Marland Kitchen glucose blood (ONE TOUCH ULTRA TEST) test strip Use to test blood sugar, test in the morning fasting and test after largest meal of the day. 100 each 11  . levofloxacin (LEVAQUIN) 750 MG tablet Take 1 tablet (750 mg total) by mouth daily. 10 tablet 0  . meloxicam (MOBIC) 15 MG tablet Take 1 tablet (15 mg total) by mouth daily. 90 tablet 0  . metFORMIN (GLUCOPHAGE) 500 MG tablet Take 2 tablets (1,000 mg total) by mouth 2 (two) times daily. 360 tablet 0  . NARCAN 4 MG/0.1ML LIQD nasal spray kit 1 SPRAY AS NEEDED    . Olmesartan-amLODIPine-HCTZ 20-5-12.5 MG TABS Take 1 tablet by mouth daily. 90 tablet 0  . ONETOUCH DELICA LANCETS 41R MISC Use for testing blood sugar, test once in the morning fasting and test after largest meal of the day. 100 each 11  . Potassium 99 MG TABS Take 99 mg by mouth every other day.     . traMADol (ULTRAM) 50 MG tablet Take 2 tablets (100 mg total) by mouth every 6 (six) hours as needed. For pain 30 tablet   . TURMERIC CURCUMIN PO Take 1 tablet by mouth every other day.    . valACYclovir (VALTREX) 1000 MG tablet TAKE 1 TABLET (1,000 MG TOTAL) BY MOUTH DAILY AS NEEDED (FOR OUTBREAKS  ONLY). 5 tablet 0  . VENTOLIN HFA 108 (90 Base) MCG/ACT inhaler INHALE 1-2 PUFFS INTO THE LUNGS EVERY 6 (SIX) HOURS  AS NEEDED FOR WHEEZING OR SHORTNESS OF BREATH. 18 Inhaler 0  . vitamin B-12 1000 MCG tablet Take 1 tablet (1,000 mcg total) by mouth daily. 30 tablet 0  . vitamin C (ASCORBIC ACID) 500 MG tablet Take 500 mg by mouth daily.    Marland Kitchen VITAMIN E PO Take 1 tablet by mouth daily.    . cyclobenzaprine (FLEXERIL) 10 MG tablet Take 1 tablet (10 mg total) by mouth 2 (two) times daily. 60 tablet 0   No facility-administered medications prior to visit.    No Known Allergies  Review of Systems Review of Systems:  A fourteen system review of systems was performed and found to be positive as per HPI.    Objective:    Physical Exam General: Well nourished, in no apparent distress. Eyes: PERRLA, EOMs, conjunctiva clr Resp: Respiratory effort- normal, ECTA B/L w/o W/R/R  Cardio: RRR S1 S2 Abdomen: no gross distention. Lymphatics:  less 2 sec cap RF M-sk: Good ROM.  Skin: Warm, dry. Second left toe wound mildly erythematous w/o drainage. Neuro: Alert, Oriented Psych: Normal affect, Insight and Judgment appropriate.   BP 131/62   Pulse 90   Temp 98.4 F (36.9 C) (Oral)   Ht 5' 10.47" (1.79 m)   Wt (!) 347 lb 1.6 oz (157.4 kg)   SpO2 95%   BMI 49.14 kg/m  Wt Readings from Last 3 Encounters:  03/03/20 (!) 347 lb 1.6 oz (157.4 kg)  02/02/20 (!) 350 lb 4.8 oz (158.9 kg)  10/21/19 (!) 345 lb 12.8 oz (156.9 kg)    Health Maintenance Due  Topic Date Due  . OPHTHALMOLOGY EXAM  Never done  . PNA vac Low Risk Adult (1 of 2 - PCV13) Never done  . COVID-19 Vaccine (2 - Pfizer 2-dose series) 12/21/2019  . INFLUENZA VACCINE  02/14/2020    There are no preventive care reminders to display for this patient.   Lab Results  Component Value Date   TSH 1.570 11/02/2019   Lab Results  Component Value Date   WBC 3.9 11/02/2019   HGB 12.9 (L) 11/02/2019   HCT 38.1 11/02/2019   MCV 92  11/02/2019   PLT 192 11/02/2019   Lab Results  Component Value Date   NA 140 11/02/2019   K 4.4 11/02/2019   CO2 25 11/02/2019   GLUCOSE 136 (H) 11/02/2019   BUN 17 11/02/2019   CREATININE 0.83 11/02/2019   BILITOT 0.3 11/02/2019   ALKPHOS 77 11/02/2019   AST 25 11/02/2019   ALT 36 11/02/2019   PROT 6.8 11/02/2019   ALBUMIN 4.3 11/02/2019   CALCIUM 9.8 11/02/2019   ANIONGAP 6 06/12/2018   Lab Results  Component Value Date   CHOL 154 11/02/2019   Lab Results  Component Value Date   HDL 35 (L) 11/02/2019   Lab Results  Component Value Date   LDLCALC 95 11/02/2019   Lab Results  Component Value Date   TRIG 136 11/02/2019   Lab Results  Component Value Date   CHOLHDL 4.4 11/02/2019   Lab Results  Component Value Date   HGBA1C 6.1 (H) 11/02/2019       Assessment & Plan:   Problem List Items Addressed This Visit      Endocrine   Type 2 diabetes mellitus with complication, without long-term current use of insulin ([place]) - Primary (Chronic)   Relevant Orders   Ambulatory referral to Pain Clinic   Ambulatory referral to Wound Clinic    Other Visit Diagnoses  Open wound of toe, subsequent encounter       Relevant Orders   Ambulatory referral to Pain Clinic   Ambulatory referral to Wound Clinic     Type 2 DM w/ complication w/o long-term use of insulin, Open wound of left toe: -Discussed with patient referral to wound care for further management due to delayed healing most likely secondary to diabetes mellitus. Pt verbalized understanding and is agreeable. -Recommend to continue to keep area clean and dry.   -Advised to schedule a chronic follow-up in 3 months for DM, HTN, and HLD.  No orders of the defined types were placed in this encounter.  Note:  This note was prepared with assistance of Dragon voice recognition software. Occasional wrong-word or sound-a-like substitutions may have occurred due to the inherent limitations of voice recognition  software.    Lorrene Reid, PA-C

## 2020-03-31 ENCOUNTER — Ambulatory Visit: Payer: Medicare Other | Admitting: Physician Assistant

## 2020-04-12 ENCOUNTER — Other Ambulatory Visit: Payer: Self-pay | Admitting: Physician Assistant

## 2020-04-12 DIAGNOSIS — M159 Polyosteoarthritis, unspecified: Secondary | ICD-10-CM

## 2020-04-12 DIAGNOSIS — E118 Type 2 diabetes mellitus with unspecified complications: Secondary | ICD-10-CM

## 2020-04-19 DIAGNOSIS — M47816 Spondylosis without myelopathy or radiculopathy, lumbar region: Secondary | ICD-10-CM | POA: Diagnosis not present

## 2020-04-19 DIAGNOSIS — M25569 Pain in unspecified knee: Secondary | ICD-10-CM | POA: Diagnosis not present

## 2020-04-19 DIAGNOSIS — G894 Chronic pain syndrome: Secondary | ICD-10-CM | POA: Diagnosis not present

## 2020-04-25 DIAGNOSIS — M961 Postlaminectomy syndrome, not elsewhere classified: Secondary | ICD-10-CM | POA: Diagnosis not present

## 2020-04-25 DIAGNOSIS — M47817 Spondylosis without myelopathy or radiculopathy, lumbosacral region: Secondary | ICD-10-CM | POA: Diagnosis not present

## 2020-04-25 DIAGNOSIS — Z79899 Other long term (current) drug therapy: Secondary | ICD-10-CM | POA: Diagnosis not present

## 2020-04-25 DIAGNOSIS — G894 Chronic pain syndrome: Secondary | ICD-10-CM | POA: Diagnosis not present

## 2020-04-25 DIAGNOSIS — Z79891 Long term (current) use of opiate analgesic: Secondary | ICD-10-CM | POA: Diagnosis not present

## 2020-04-26 DIAGNOSIS — G4733 Obstructive sleep apnea (adult) (pediatric): Secondary | ICD-10-CM | POA: Diagnosis not present

## 2020-04-30 ENCOUNTER — Other Ambulatory Visit: Payer: Self-pay | Admitting: Physician Assistant

## 2020-04-30 DIAGNOSIS — E1159 Type 2 diabetes mellitus with other circulatory complications: Secondary | ICD-10-CM

## 2020-05-02 ENCOUNTER — Telehealth: Payer: Self-pay | Admitting: Physician Assistant

## 2020-05-02 ENCOUNTER — Other Ambulatory Visit: Payer: Self-pay | Admitting: Physician Assistant

## 2020-05-02 DIAGNOSIS — M159 Polyosteoarthritis, unspecified: Secondary | ICD-10-CM

## 2020-05-02 MED ORDER — CYCLOBENZAPRINE HCL 10 MG PO TABS: 10.0000 mg | ORAL_TABLET | Freq: Two times a day (BID) | ORAL | 0 refills | Status: DC

## 2020-05-02 NOTE — Addendum Note (Signed)
Addended by: Sylvester Harder on: 05/02/2020 10:18 AM   Modules accepted: Orders

## 2020-05-02 NOTE — Telephone Encounter (Signed)
Patient is requesting a refill of his cyclobenzaprine, if approved please send to [facility] on [address]

## 2020-05-02 NOTE — Telephone Encounter (Signed)
30 day supply sent to requested pharmacy. AS< CMA

## 2020-06-03 ENCOUNTER — Encounter: Payer: Self-pay | Admitting: Physician Assistant

## 2020-06-03 ENCOUNTER — Other Ambulatory Visit: Payer: Self-pay

## 2020-06-03 ENCOUNTER — Ambulatory Visit (INDEPENDENT_AMBULATORY_CARE_PROVIDER_SITE_OTHER): Payer: Medicare Other | Admitting: Physician Assistant

## 2020-06-03 VITALS — BP 122/68 | HR 74 | Temp 98.6°F | Ht 70.0 in | Wt 350.0 lb

## 2020-06-03 DIAGNOSIS — E1159 Type 2 diabetes mellitus with other circulatory complications: Secondary | ICD-10-CM

## 2020-06-03 DIAGNOSIS — E1169 Type 2 diabetes mellitus with other specified complication: Secondary | ICD-10-CM

## 2020-06-03 DIAGNOSIS — I152 Hypertension secondary to endocrine disorders: Secondary | ICD-10-CM

## 2020-06-03 DIAGNOSIS — D61818 Other pancytopenia: Secondary | ICD-10-CM

## 2020-06-03 DIAGNOSIS — E782 Mixed hyperlipidemia: Secondary | ICD-10-CM

## 2020-06-03 DIAGNOSIS — E559 Vitamin D deficiency, unspecified: Secondary | ICD-10-CM | POA: Diagnosis not present

## 2020-06-03 DIAGNOSIS — B001 Herpesviral vesicular dermatitis: Secondary | ICD-10-CM

## 2020-06-03 DIAGNOSIS — E11621 Type 2 diabetes mellitus with foot ulcer: Secondary | ICD-10-CM

## 2020-06-03 LAB — POCT GLYCOSYLATED HEMOGLOBIN (HGB A1C): Hemoglobin A1C: 6.9 % — AB (ref 4.0–5.6)

## 2020-06-03 LAB — POCT UA - MICROALBUMIN
Albumin/Creatinine Ratio, Urine, POC: <30
Creatinine, POC: 50 mg/dL
Microalbumin Ur, POC: 10 mg/L

## 2020-06-03 MED ORDER — ALBUTEROL SULFATE HFA 108 (90 BASE) MCG/ACT IN AERS: INHALATION_SPRAY | RESPIRATORY_TRACT | 0 refills | Status: DC

## 2020-06-03 MED ORDER — VALACYCLOVIR HCL 1 G PO TABS: 1000.0000 mg | ORAL_TABLET | Freq: Every day | ORAL | 0 refills | Status: DC | PRN

## 2020-06-03 NOTE — Progress Notes (Signed)
Established Patient Office Visit  Subjective:  Patient ID: Robert [name], male    DOB: [birth year]  Age: 64 y.o. MRN: [id number]  CC:  Chief Complaint  Patient presents with   Diabetes   Hyperlipidemia    HPI Robert [name] presents for follow up on diabetes mellitus and hyperlipidemia.  Requesting medication refills.  Diabetes mellitus: Pt denies increased urination or thirst. Pt reports medication compliance. No hypoglycemic events. Not checking glucose at home. Reports has made some dietary changes by reducing sugar intake. The last couple of weeks has been more active.  HLD: Reports not taking Lipitor because he never got the medication. Didn't know he was suppose to be on it.  HTN: Pt denies chest pain, palpitations, dizziness or increased lower extremity swelling. Taking medication as directed without side effects.  Reports has increased water consumption.  Cold sores: Reports has about 3-4 outbreaks per year.  Past Medical History:  Diagnosis Date   Allergic rhinitis due to pollen    Arthritis    Asthma    Back pain    Barrett esophagus    Depressive disorder    Diabetes mellitus type 2 in obese (HCC)    Exposure to hepatitis B    Exposure to hepatitis C    Narcotic abuse (HCC)    pain medications   OSA (obstructive sleep apnea)    on CPAP     Past Surgical History:  Procedure Laterality Date   ABDOMINAL SURGERY     AMPUTATION Right 06/11/2018   Procedure: RIGHT 2ND RAY AMPUTATION;  Surgeon: Newt Minion, MD;  Location: [facility];  Service: Orthopedics;  Laterality: Right;   BACK SURGERY     Rods and screws in lumbar area   COLONOSCOPY     ESOPHAGOGASTRODUODENOSCOPY  multiple   HERNIA REPAIR     4   JOINT REPLACEMENT     TOTAL KNEE ARTHROPLASTY  2009   left    Family History  Problem Relation Age of Onset   Allergies Mother    Early death Father    Thyroid disease Sister    Colon cancer Neg Hx    Stomach cancer Neg Hx      Social History   Socioeconomic History   Marital status: Married    Spouse name: Not on file   Number of children: 2   Years of education: Not on file   Highest education level: Not on file  Occupational History   Occupation: Musician, Disabled  Tobacco Use   Smoking status: Former Smoker    Packs/day: 0.80    Years: 15.00    Pack years: 12.00    Types: Cigarettes    Quit date: 07/16/2004    Years since quitting: 15.8   Smokeless tobacco: Never Used  Scientific laboratory technician Use: Never used  Substance and Sexual Activity   Alcohol use: No   Drug use: No    Comment: Prior abuse of narcotics   Sexual activity: Never  Other Topics Concern   Not on file  Social History Narrative   Married, 2 daughters born 2003 and 2004. 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 [name] history of living in working in [place].   6 caffeinated beverages daily.   01/21/2017   Social Determinants of Health   Financial Resource Strain:    Difficulty of Paying Living Expenses: Not on file  Food Insecurity:    Worried About Charity fundraiser in  the Last Year: Not on file   Ran Out of Food in the Last Year: Not on file  Transportation Needs:    Lack of Transportation (Medical): Not on file   Lack of Transportation (Non-Medical): Not on file  Physical Activity:    Days of Exercise per Week: Not on file   Minutes of Exercise per Session: Not on file  Stress:    Feeling of Stress : Not on file  Social Connections:    Frequency of Communication with Friends and Family: Not on file   Frequency of Social Gatherings with Friends and Family: Not on file   Attends Religious Services: Not on file   Active Member of Clubs or Organizations: Not on file   Attends Archivist Meetings: Not on file   Marital Status: Not on file  Intimate Partner Violence:    Fear of Current or Ex-Partner: Not on file   Emotionally Abused: Not on file    Physically Abused: Not on file   Sexually Abused: Not on file    Outpatient Medications Prior to Visit  Medication Sig Dispense Refill   APPLE CIDER VINEGAR PO Take 15 mLs by mouth 2 (two) times daily.      B Complex Vitamins (B COMPLEX 100 PO) Take 1 capsule by mouth every other day.      blood glucose meter kit and supplies KIT Dispense based on patient and insurance preference. Use up to four times daily as directed. (FOR ICD-9 250.00, 250.01). 1 each 0   blood glucose meter kit and supplies Dispense based on patient and insurance preference. Use to check glucose fasting in the AM and then after largest meal of the day.. (FOR ICD-10 E10.9, E11.9). 1 each 0   Blood Glucose Monitoring Suppl (ONE TOUCH ULTRA MINI) w/Device KIT Use to test blood sugar, test in the morning fasting and test after largest meal of the day. 1 each 0   CALCIUM-MAGNESIUM-ZINC PO Take 1 tablet by mouth every other day.     Cholecalciferol (VITAMIN D3) 5000 units TABS 5,000 IU OTC vitamin D3 daily. (Patient taking differently: Take 5,000 Units by mouth daily. ) 90 tablet 3   cyclobenzaprine (FLEXERIL) 10 MG tablet Take 1 tablet (10 mg total) by mouth 2 (two) times daily. 60 tablet 0   DANDELION PO Take 1 tablet by mouth every other day.     glucose blood (ONE TOUCH ULTRA TEST) test strip Use to test blood sugar, test in the morning fasting and test after largest meal of the day. 100 each 11   meloxicam (MOBIC) 15 MG tablet TAKE 1 TABLET BY MOUTH EVERY DAY 90 tablet 0   metFORMIN (GLUCOPHAGE) 500 MG tablet TAKE 2 TABLETS BY MOUTH TWICE A DAY 360 tablet 0   NARCAN 4 MG/0.1ML LIQD nasal spray kit 1 SPRAY AS NEEDED     Olmesartan-amLODIPine-HCTZ 20-5-12.5 MG TABS TAKE 1 TABLET BY MOUTH EVERY DAY 60 tablet 0   ONETOUCH DELICA LANCETS 24Q MISC Use for testing blood sugar, test once in the morning fasting and test after largest meal of the day. 100 each 11   Potassium 99 MG TABS Take 99 mg by mouth every other  day.      traMADol (ULTRAM) 50 MG tablet Take 2 tablets (100 mg total) by mouth every 6 (six) hours as needed. For pain 30 tablet    TURMERIC CURCUMIN PO Take 1 tablet by mouth every other day.     vitamin B-12 1000 MCG  tablet Take 1 tablet (1,000 mcg total) by mouth daily. 30 tablet 0   vitamin C (ASCORBIC ACID) 500 MG tablet Take 500 mg by mouth daily.     VITAMIN E PO Take 1 tablet by mouth daily.     valACYclovir (VALTREX) 1000 MG tablet TAKE 1 TABLET (1,000 MG TOTAL) BY MOUTH DAILY AS NEEDED (FOR OUTBREAKS ONLY). 5 tablet 0   VENTOLIN HFA 108 (90 Base) MCG/ACT inhaler INHALE 1-2 PUFFS INTO THE LUNGS EVERY 6 (SIX) HOURS AS NEEDED FOR WHEEZING OR SHORTNESS OF BREATH. 18 Inhaler 0   atorvastatin (LIPITOR) 20 MG tablet TAKE 1 TABLET BY MOUTH EVERYDAY AT BEDTIME (Patient taking differently: Take 20 mg by mouth daily at 6 PM. ) 90 tablet 0   levofloxacin (LEVAQUIN) 750 MG tablet Take 1 tablet (750 mg total) by mouth daily. 10 tablet 0   No facility-administered medications prior to visit.    No Known Allergies  ROS Review of Systems A fourteen system review of systems was performed and found to be positive as per HPI.   Objective:    Physical Exam General: In no acute distress, appropriate for stated age.  Neuro:  Alert and oriented,  extra-ocular muscles intact  HEENT:  Normocephalic, atraumatic, neck supple Skin:  no gross rash, warm, pink. Cardiac:  RRR, S1 S2 Respiratory:  ECTA B/L, Not using accessory muscles, speaking in full sentences- unlabored. Vascular:  Ext warm, no cyanosis apprec.;  Lower extremity varicosities noted. Psych:  No HI/SI, judgement and insight good, Euthymic mood. Full Affect.   BP 122/68    Pulse 74    Temp 98.6 F (37 C) (Oral)    Ht _0  (1.778 m)    Wt (!) 350 lb (158.8 kg)    SpO2 98% Comment: on RA   BMI 50.22 kg/m  Wt Readings from Last 3 Encounters:  06/03/20 (!) 350 lb (158.8 kg)  03/03/20 (!) 347 lb 1.6 oz (157.4 kg)  02/02/20 (!)  350 lb 4.8 oz (158.9 kg)     Health Maintenance Due  Topic Date Due   OPHTHALMOLOGY EXAM  Never done   PNA vac Low Risk Adult (1 of 2 - PCV13) Never done   COVID-19 Vaccine (2 - Pfizer 2-dose series) 12/21/2019   INFLUENZA VACCINE  02/14/2020    There are no preventive care reminders to display for this patient.  Lab Results  Component Value Date   TSH 1.570 11/02/2019   Lab Results  Component Value Date   WBC 3.9 11/02/2019   HGB 12.9 (L) 11/02/2019   HCT 38.1 11/02/2019   MCV 92 11/02/2019   PLT 192 11/02/2019   Lab Results  Component Value Date   NA 140 11/02/2019   K 4.4 11/02/2019   CO2 25 11/02/2019   GLUCOSE 136 (H) 11/02/2019   BUN 17 11/02/2019   CREATININE 0.83 11/02/2019   BILITOT 0.3 11/02/2019   ALKPHOS 77 11/02/2019   AST 25 11/02/2019   ALT 36 11/02/2019   PROT 6.8 11/02/2019   ALBUMIN 4.3 11/02/2019   CALCIUM 9.8 11/02/2019   ANIONGAP 6 06/12/2018   Lab Results  Component Value Date   CHOL 154 11/02/2019   Lab Results  Component Value Date   HDL 35 (L) 11/02/2019   Lab Results  Component Value Date   LDLCALC 95 11/02/2019   Lab Results  Component Value Date   TRIG 136 11/02/2019   Lab Results  Component Value Date   CHOLHDL 4.4 11/02/2019  Lab Results  Component Value Date   HGBA1C 6.9 (A) 06/03/2020      Assessment & Plan:   Problem List Items Addressed This Visit      Cardiovascular and Mediastinum   Hypertension associated with diabetes ([place]) (Chronic)    -BP at goal.  Asymptomatic. -Continue current medication regimen. -Continue good hydration and follow low-sodium diet. -Continue to stay as active as possible. -Will repeat CMP at next OV for medication monitoring.        Endocrine   Mixed diabetic hyperlipidemia associated with type 2 diabetes mellitus (HCC) (Chronic)    -Last lipid panel fairly within normal limits with exception of LDL at 95. Discussed with patient LDL goal is <70 due to existing  comorbidities. Plan to repeat lipid panel at follow-up visit and if LDL is not at goal recommend considering starting statin therapy. Patient verbalized understanding and is agreeable. -Recommend to reduce saturated and transfats and increase whole grains, lean meats, fruits and vegetables. -Will continue to monitor.      Type 2 diabetes mellitus with foot ulcer (CODE) (HCC) - Primary    -A1c increased from 6.1 to 6.9, but continues to be fairly at goal.  Left toe ulcer is gradually healing, no signs of infection present. -Continue current medication regimen. -Patient is motivated to improve A1c and encouraged to continue with dietary changes by reducing carbohydrates and glucose. -Continue to stay active. -Will continue to monitor. -UA microalbumin performed today, normal. -Advised to schedule annual diabetic eye exam.      Relevant Orders   POCT glycosylated hemoglobin (Hb A1C) (Completed)   POCT UA - Microalbumin (Completed)     Hematopoietic and Hemostatic   Pancytopenia ([place])    -Related to hospitalization from 2019. -Last CBC: White blood cells, Red blood cells, and platelets WNL        Other   Morbid (severe) obesity due to excess calories (HCC) (Chronic)    -Associated with diabetes mellitus, hypertension, hyperlipidemia. -Encourage to continue with dietary changes and lifestyle modifications.      Vitamin D deficiency (Chronic)    -Last vitamin D 27.3, mildly low. -Continue vitamin D supplement. -Will continue to monitor.       Other Visit Diagnoses    Recurrent cold sores       Relevant Medications   valACYclovir (VALTREX) 1000 MG tablet     Recurrent cold sores: -On Valtrex as needed for outbreak.  Provided refill. -Will continue to monitor.   Meds ordered this encounter  Medications   valACYclovir (VALTREX) 1000 MG tablet    Sig: Take 1 tablet (1,000 mg total) by mouth daily as needed (for outbreaks only).    Dispense:  5 tablet    Refill:  0     Order Specific Question:   Supervising Provider    Answer:   Beatrice Lecher D [[id number]]   albuterol (VENTOLIN HFA) 108 (90 Base) MCG/ACT inhaler    Sig: INHALE 1-2 PUFFS INTO THE LUNGS EVERY 6 (SIX) HOURS AS NEEDED FOR WHEEZING OR SHORTNESS OF BREATH.    Dispense:  18 g    Refill:  0    Order Specific Question:   Supervising Provider    Answer:   Beatrice Lecher D [[id number]]    Follow-up: Return in about 3 months (around 09/03/2020) for DM, HTN, HLD an FBW (lipid panel, cmp, a1c, cbc) few days prior.   Note:  This note was prepared with assistance of Dragon voice recognition software. Occasional wrong-word or sound-a-like  substitutions may have occurred due to the inherent limitations of voice recognition software.  Lorrene Reid, PA-C

## 2020-06-03 NOTE — Assessment & Plan Note (Signed)
-  Last vitamin D 27.3, mildly low. -Continue vitamin D supplement. -Will continue to monitor.

## 2020-06-03 NOTE — Assessment & Plan Note (Addendum)
-  A1c increased from 6.1 to 6.9, but continues to be fairly at goal.  Left toe ulcer is gradually healing, no signs of infection present. -Continue current medication regimen. -Patient is motivated to improve A1c and encouraged to continue with dietary changes by reducing carbohydrates and glucose. -Continue to stay active. -Will continue to monitor. -UA microalbumin performed today, normal. -Advised to schedule annual diabetic eye exam.

## 2020-06-03 NOTE — Assessment & Plan Note (Addendum)
-  Last lipid panel fairly within normal limits with exception of LDL at 95. Discussed with patient LDL goal is <70 due to existing comorbidities. Plan to repeat lipid panel at follow-up visit and if LDL is not at goal recommend considering starting statin therapy. Patient verbalized understanding and is agreeable. -Recommend to reduce saturated and transfats and increase whole grains, lean meats, fruits and vegetables. -Will continue to monitor.

## 2020-06-03 NOTE — Assessment & Plan Note (Signed)
-  BP at goal.  Asymptomatic. -Continue current medication regimen. -Continue good hydration and follow low-sodium diet. -Continue to stay as active as possible. -Will repeat CMP at next OV for medication monitoring.

## 2020-06-03 NOTE — Assessment & Plan Note (Signed)
-  Related to hospitalization from 2019. -Last CBC: White blood cells, Red blood cells, and platelets WNL

## 2020-06-03 NOTE — Assessment & Plan Note (Signed)
-  Associated with diabetes mellitus, hypertension, hyperlipidemia. -Encourage to continue with dietary changes and lifestyle modifications.

## 2020-06-03 NOTE — Patient Instructions (Signed)

## 2020-06-21 DIAGNOSIS — M545 Low back pain, unspecified: Secondary | ICD-10-CM | POA: Diagnosis not present

## 2020-06-21 DIAGNOSIS — Z79891 Long term (current) use of opiate analgesic: Secondary | ICD-10-CM | POA: Diagnosis not present

## 2020-06-21 DIAGNOSIS — G894 Chronic pain syndrome: Secondary | ICD-10-CM | POA: Diagnosis not present

## 2020-06-21 DIAGNOSIS — M47816 Spondylosis without myelopathy or radiculopathy, lumbar region: Secondary | ICD-10-CM | POA: Diagnosis not present

## 2020-06-21 DIAGNOSIS — M25569 Pain in unspecified knee: Secondary | ICD-10-CM | POA: Diagnosis not present

## 2020-06-21 DIAGNOSIS — Z79899 Other long term (current) drug therapy: Secondary | ICD-10-CM | POA: Diagnosis not present

## 2020-06-26 ENCOUNTER — Other Ambulatory Visit: Payer: Self-pay | Admitting: Physician Assistant

## 2020-06-26 DIAGNOSIS — E1159 Type 2 diabetes mellitus with other circulatory complications: Secondary | ICD-10-CM

## 2020-06-28 ENCOUNTER — Other Ambulatory Visit: Payer: Self-pay | Admitting: Physician Assistant

## 2020-06-29 ENCOUNTER — Other Ambulatory Visit: Payer: Self-pay | Admitting: Physician Assistant

## 2020-06-29 DIAGNOSIS — M9902 Segmental and somatic dysfunction of thoracic region: Secondary | ICD-10-CM | POA: Diagnosis not present

## 2020-06-29 DIAGNOSIS — M159 Polyosteoarthritis, unspecified: Secondary | ICD-10-CM

## 2020-06-29 DIAGNOSIS — M9905 Segmental and somatic dysfunction of pelvic region: Secondary | ICD-10-CM | POA: Diagnosis not present

## 2020-06-29 DIAGNOSIS — M5116 Intervertebral disc disorders with radiculopathy, lumbar region: Secondary | ICD-10-CM | POA: Diagnosis not present

## 2020-06-29 DIAGNOSIS — M461 Sacroiliitis, not elsewhere classified: Secondary | ICD-10-CM | POA: Diagnosis not present

## 2020-06-29 DIAGNOSIS — M9904 Segmental and somatic dysfunction of sacral region: Secondary | ICD-10-CM | POA: Diagnosis not present

## 2020-07-06 ENCOUNTER — Other Ambulatory Visit: Payer: Self-pay | Admitting: Physician Assistant

## 2020-07-06 DIAGNOSIS — E118 Type 2 diabetes mellitus with unspecified complications: Secondary | ICD-10-CM

## 2020-07-07 ENCOUNTER — Other Ambulatory Visit: Payer: Self-pay | Admitting: Physician Assistant

## 2020-07-07 DIAGNOSIS — M159 Polyosteoarthritis, unspecified: Secondary | ICD-10-CM

## 2020-07-21 ENCOUNTER — Other Ambulatory Visit: Payer: Self-pay | Admitting: Physician Assistant

## 2020-07-22 ENCOUNTER — Other Ambulatory Visit: Payer: Self-pay | Admitting: Physician Assistant

## 2020-07-22 DIAGNOSIS — M159 Polyosteoarthritis, unspecified: Secondary | ICD-10-CM

## 2020-07-22 DIAGNOSIS — E118 Type 2 diabetes mellitus with unspecified complications: Secondary | ICD-10-CM

## 2020-07-22 DIAGNOSIS — E1159 Type 2 diabetes mellitus with other circulatory complications: Secondary | ICD-10-CM

## 2020-08-17 DIAGNOSIS — M9901 Segmental and somatic dysfunction of cervical region: Secondary | ICD-10-CM | POA: Diagnosis not present

## 2020-08-17 DIAGNOSIS — M9905 Segmental and somatic dysfunction of pelvic region: Secondary | ICD-10-CM | POA: Diagnosis not present

## 2020-08-17 DIAGNOSIS — M542 Cervicalgia: Secondary | ICD-10-CM | POA: Diagnosis not present

## 2020-08-17 DIAGNOSIS — M546 Pain in thoracic spine: Secondary | ICD-10-CM | POA: Diagnosis not present

## 2020-08-17 DIAGNOSIS — M5116 Intervertebral disc disorders with radiculopathy, lumbar region: Secondary | ICD-10-CM | POA: Diagnosis not present

## 2020-08-17 DIAGNOSIS — M461 Sacroiliitis, not elsewhere classified: Secondary | ICD-10-CM | POA: Diagnosis not present

## 2020-08-17 DIAGNOSIS — M9904 Segmental and somatic dysfunction of sacral region: Secondary | ICD-10-CM | POA: Diagnosis not present

## 2020-08-17 DIAGNOSIS — M9902 Segmental and somatic dysfunction of thoracic region: Secondary | ICD-10-CM | POA: Diagnosis not present

## 2020-08-18 ENCOUNTER — Other Ambulatory Visit: Payer: Self-pay | Admitting: Physician Assistant

## 2020-08-18 DIAGNOSIS — G894 Chronic pain syndrome: Secondary | ICD-10-CM | POA: Diagnosis not present

## 2020-08-18 DIAGNOSIS — Z79899 Other long term (current) drug therapy: Secondary | ICD-10-CM | POA: Diagnosis not present

## 2020-08-18 DIAGNOSIS — M47816 Spondylosis without myelopathy or radiculopathy, lumbar region: Secondary | ICD-10-CM | POA: Diagnosis not present

## 2020-08-18 DIAGNOSIS — Z79891 Long term (current) use of opiate analgesic: Secondary | ICD-10-CM | POA: Diagnosis not present

## 2020-08-18 DIAGNOSIS — M25569 Pain in unspecified knee: Secondary | ICD-10-CM | POA: Diagnosis not present

## 2020-08-24 DIAGNOSIS — G4733 Obstructive sleep apnea (adult) (pediatric): Secondary | ICD-10-CM | POA: Diagnosis not present

## 2020-08-25 ENCOUNTER — Other Ambulatory Visit: Payer: Self-pay | Admitting: Physician Assistant

## 2020-08-25 DIAGNOSIS — E11621 Type 2 diabetes mellitus with foot ulcer: Secondary | ICD-10-CM

## 2020-08-25 DIAGNOSIS — I152 Hypertension secondary to endocrine disorders: Secondary | ICD-10-CM

## 2020-08-25 DIAGNOSIS — E1169 Type 2 diabetes mellitus with other specified complication: Secondary | ICD-10-CM

## 2020-08-31 ENCOUNTER — Other Ambulatory Visit: Payer: Self-pay

## 2020-08-31 ENCOUNTER — Other Ambulatory Visit: Payer: Medicare Other

## 2020-08-31 DIAGNOSIS — E1169 Type 2 diabetes mellitus with other specified complication: Secondary | ICD-10-CM | POA: Diagnosis not present

## 2020-08-31 DIAGNOSIS — E11621 Type 2 diabetes mellitus with foot ulcer: Secondary | ICD-10-CM | POA: Diagnosis not present

## 2020-08-31 DIAGNOSIS — E782 Mixed hyperlipidemia: Secondary | ICD-10-CM

## 2020-08-31 DIAGNOSIS — E1159 Type 2 diabetes mellitus with other circulatory complications: Secondary | ICD-10-CM | POA: Diagnosis not present

## 2020-08-31 DIAGNOSIS — I152 Hypertension secondary to endocrine disorders: Secondary | ICD-10-CM | POA: Diagnosis not present

## 2020-09-01 LAB — COMPREHENSIVE METABOLIC PANEL
ALT: 34 IU/L (ref 0–44)
AST: 31 IU/L (ref 0–40)
Albumin/Globulin Ratio: 2.1 (ref 1.2–2.2)
Albumin: 4.4 g/dL (ref 3.8–4.8)
Alkaline Phosphatase: 71 IU/L (ref 44–121)
BUN/Creatinine Ratio: 15 (ref 10–24)
BUN: 15 mg/dL (ref 8–27)
Bilirubin Total: 0.3 mg/dL (ref 0.0–1.2)
CO2: 23 mmol/L (ref 20–29)
Calcium: 9.3 mg/dL (ref 8.6–10.2)
Chloride: 100 mmol/L (ref 96–106)
Creatinine, Ser: 0.97 mg/dL (ref 0.76–1.27)
GFR calc Af Amer: 94 mL/min/{1.73_m2} (ref >59)
GFR calc non Af Amer: 81 mL/min/{1.73_m2} (ref >59)
Globulin, Total: 2.1 g/dL (ref 1.5–4.5)
Glucose: 173 mg/dL — ABNORMAL HIGH (ref 65–99)
Potassium: 4.5 mmol/L (ref 3.5–5.2)
Sodium: 139 mmol/L (ref 134–144)
Total Protein: 6.5 g/dL (ref 6.0–8.5)

## 2020-09-01 LAB — HEMOGLOBIN A1C
Est. average glucose Bld gHb Est-mCnc: 146 mg/dL
Hgb A1c MFr Bld: 6.7 % — ABNORMAL HIGH (ref 4.8–5.6)

## 2020-09-01 LAB — CBC
Hematocrit: 38.6 % (ref 37.5–51.0)
Hemoglobin: 13 g/dL (ref 13.0–17.7)
MCH: 31 pg (ref 26.6–33.0)
MCHC: 33.7 g/dL (ref 31.5–35.7)
MCV: 92 fL (ref 79–97)
Platelets: 170 10*3/uL (ref 150–450)
RBC: 4.19 x10E6/uL (ref 4.14–5.80)
RDW: 12.9 % (ref 11.6–15.4)
WBC: 4.3 10*3/uL (ref 3.4–10.8)

## 2020-09-01 LAB — LIPID PANEL
Chol/HDL Ratio: 4.6 ratio (ref 0.0–5.0)
Cholesterol, Total: 151 mg/dL (ref 100–199)
HDL: 33 mg/dL — ABNORMAL LOW (ref >39)
LDL Chol Calc (NIH): 92 mg/dL (ref 0–99)
Triglycerides: 145 mg/dL (ref 0–149)
VLDL Cholesterol Cal: 26 mg/dL (ref 5–40)

## 2020-09-02 DIAGNOSIS — M170 Bilateral primary osteoarthritis of knee: Secondary | ICD-10-CM | POA: Diagnosis not present

## 2020-09-02 DIAGNOSIS — M1711 Unilateral primary osteoarthritis, right knee: Secondary | ICD-10-CM | POA: Diagnosis not present

## 2020-09-06 ENCOUNTER — Encounter: Payer: Self-pay | Admitting: Physician Assistant

## 2020-09-06 ENCOUNTER — Other Ambulatory Visit: Payer: Self-pay

## 2020-09-06 ENCOUNTER — Ambulatory Visit (INDEPENDENT_AMBULATORY_CARE_PROVIDER_SITE_OTHER): Payer: Medicare Other | Admitting: Physician Assistant

## 2020-09-06 VITALS — BP 120/81 | HR 63 | Temp 98.2°F | Ht 70.0 in | Wt 352.8 lb

## 2020-09-06 DIAGNOSIS — E118 Type 2 diabetes mellitus with unspecified complications: Secondary | ICD-10-CM

## 2020-09-06 DIAGNOSIS — I152 Hypertension secondary to endocrine disorders: Secondary | ICD-10-CM | POA: Diagnosis not present

## 2020-09-06 DIAGNOSIS — E1159 Type 2 diabetes mellitus with other circulatory complications: Secondary | ICD-10-CM

## 2020-09-06 DIAGNOSIS — E1169 Type 2 diabetes mellitus with other specified complication: Secondary | ICD-10-CM

## 2020-09-06 DIAGNOSIS — E782 Mixed hyperlipidemia: Secondary | ICD-10-CM

## 2020-09-06 DIAGNOSIS — E11621 Type 2 diabetes mellitus with foot ulcer: Secondary | ICD-10-CM

## 2020-09-06 MED ORDER — ROSUVASTATIN CALCIUM 10 MG PO TABS: 10.0000 mg | ORAL_TABLET | Freq: Every day | ORAL | 0 refills | Status: DC

## 2020-09-06 NOTE — Progress Notes (Signed)
Established Patient Office Visit  Subjective:  Patient ID: Robert [name], male    DOB: 1955-09-13  Age: 65 y.o. MRN: [id number]  CC:  Chief Complaint  Patient presents with  . Diabetes  . Hypertension  . Hyperlipidemia    HPI Robert [name] presents for follow up on diabetes mellitus, hypertension and hyperlipidemia.  Diabetes: Pt denies increased urination or thirst. Pt reports medication compliance. No hypoglycemic events. Has not been checking glucose at home. States has reduced carbohydrates and sweets. A few weeks ago joined the [employer] and goes three times weekly.   HTN: Pt denies chest pain, palpitations, dizziness or increased lower extremity swelling from baseline. Taking medication as directed without side effects. Pt tries to monitor sodium and drink water.   HLD: Pt is not on statin medication. Reports diet consists of vegetables, lean meats and limits red meat.    Past Medical History:  Diagnosis Date  . Allergic rhinitis due to pollen   . Arthritis   . Asthma   . Back pain   . Barrett esophagus   . Depressive disorder   . Diabetes mellitus type 2 in obese ([place])   . Exposure to hepatitis B   . Exposure to hepatitis C   . Narcotic abuse (HCC)    pain medications  . OSA (obstructive sleep apnea)    on CPAP     Past Surgical History:  Procedure Laterality Date  . ABDOMINAL SURGERY    . AMPUTATION Right 06/11/2018   Procedure: RIGHT 2ND RAY AMPUTATION;  Surgeon: Newt Minion, MD;  Location: [facility];  Service: Orthopedics;  Laterality: Right;  . BACK SURGERY     Rods and screws in lumbar area  . COLONOSCOPY    . ESOPHAGOGASTRODUODENOSCOPY  multiple  . HERNIA REPAIR     6  . JOINT REPLACEMENT    . TOTAL KNEE ARTHROPLASTY  2009   left    Family History  Problem Relation Age of Onset  . Allergies Mother   . Early death Father   . Thyroid disease Sister   . Colon cancer Neg Hx   . Stomach cancer Neg Hx     Social History   Socioeconomic History   . Marital status: Married    Spouse name: Not on file  . Number of children: 2  . Years of education: Not on file  . Highest education level: Not on file  Occupational History  . Occupation: Musician, Disabled  Tobacco Use  . Smoking status: Former Smoker    Packs/day: 0.80    Years: 15.00    Pack years: 12.00    Types: Cigarettes    Quit date: 07/16/2004    Years since quitting: 16.1  . Smokeless tobacco: Never Used  Vaping Use  . Vaping Use: Never used  Substance and Sexual Activity  . Alcohol use: No  . Drug use: No    Comment: Prior abuse of narcotics  . Sexual activity: Never  Other Topics Concern  . Not on file  Social History Narrative   Married, 2 daughters born 2003 and 2004. 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 [place] history of living in working in [place].   6 caffeinated beverages daily.   01/21/2017   Social Determinants of Health   Financial Resource Strain: Not on file  Food Insecurity: Not on file  Transportation Needs: Not on file  Physical Activity: Not on file  Stress: Not on file  Social  Connections: Not on file  Intimate Partner Violence: Not on file    Outpatient Medications Prior to Visit  Medication Sig Dispense Refill  . albuterol (VENTOLIN HFA) 108 (90 Base) MCG/ACT inhaler INHALE 1-2 PUFFS BY MOUTH EVERY 6 HOURS AS NEEDED FOR WHEEZE OR SHORTNESS OF BREATH 8.5 each 0  . APPLE CIDER VINEGAR PO Take 15 mLs by mouth 2 (two) times daily.     . B Complex Vitamins (B COMPLEX 100 PO) Take 1 capsule by mouth every other day.     . blood glucose meter kit and supplies KIT Dispense based on patient and insurance preference. Use up to four times daily as directed. (FOR ICD-9 250.00, 250.01). 1 each 0  . blood glucose meter kit and supplies Dispense based on patient and insurance preference. Use to check glucose fasting in the AM and then after largest meal of the day.. (FOR ICD-10 E10.9, E11.9). 1 each 0  . Blood Glucose  Monitoring Suppl (ONE TOUCH ULTRA MINI) w/Device KIT Use to test blood sugar, test in the morning fasting and test after largest meal of the day. 1 each 0  . CALCIUM-MAGNESIUM-ZINC PO Take 1 tablet by mouth every other day.    . Cholecalciferol (VITAMIN D3) 5000 units TABS 5,000 IU OTC vitamin D3 daily. (Patient taking differently: Take 5,000 Units by mouth daily.) 90 tablet 3  . cyclobenzaprine (FLEXERIL) 10 MG tablet TAKE 1 TABLET BY MOUTH TWICE A DAY 60 tablet 0  . DANDELION PO Take 1 tablet by mouth every other day.    Marland Kitchen glucose blood (ONE TOUCH ULTRA TEST) test strip Use to test blood sugar, test in the morning fasting and test after largest meal of the day. 100 each 11  . meloxicam (MOBIC) 15 MG tablet TAKE 1 TABLET BY MOUTH EVERY DAY 90 tablet 0  . metFORMIN (GLUCOPHAGE) 500 MG tablet TAKE 2 TABLETS BY MOUTH TWICE A DAY 360 tablet 0  . NARCAN 4 MG/0.1ML LIQD nasal spray kit 1 SPRAY AS NEEDED    . Olmesartan-amLODIPine-HCTZ 20-5-12.5 MG TABS TAKE 1 TABLET BY MOUTH EVERY DAY 90 tablet 0  . ONETOUCH DELICA LANCETS 81W MISC Use for testing blood sugar, test once in the morning fasting and test after largest meal of the day. 100 each 11  . Potassium 99 MG TABS Take 99 mg by mouth every other day.     . traMADol (ULTRAM) 50 MG tablet Take 2 tablets (100 mg total) by mouth every 6 (six) hours as needed. For pain 30 tablet   . TURMERIC CURCUMIN PO Take 1 tablet by mouth every other day.    . valACYclovir (VALTREX) 1000 MG tablet TAKE 1 TABLET (1,000 MG TOTAL) BY MOUTH DAILY AS NEEDED (FOR OUTBREAKS ONLY). 5 tablet 0  . vitamin B-12 1000 MCG tablet Take 1 tablet (1,000 mcg total) by mouth daily. 30 tablet 0  . vitamin C (ASCORBIC ACID) 500 MG tablet Take 500 mg by mouth daily.    Marland Kitchen VITAMIN E PO Take 1 tablet by mouth daily.     No facility-administered medications prior to visit.    No Known Allergies  ROS Review of Systems A fourteen system review of systems was performed and found to be  positive as per HPI.   Objective:    Physical Exam General:  Pleasant and cooperative, in no acute distress  Neuro:  Alert and oriented,  extra-ocular muscles intact  HEENT:  Normocephalic, atraumatic, neck supple Skin:  no gross rash, warm, pink.  Cardiac:  RRR, S1 S2 Respiratory:  ECTA B/L w/o wheezing, Not using accessory muscles, speaking in full sentences- unlabored. Vascular:  Ext warm, no cyanosis apprec.; cap RF less 2 sec. Trace edema  Psych:  No HI/SI, judgement and insight good, Euthymic mood. Full Affect.   BP 120/81   Pulse 63   Temp 98.2 F (36.8 C)   Ht 5' 10"  (1.778 m)   Wt (!) 352 lb 12.8 oz (160 kg)   SpO2 98%   BMI 50.62 kg/m  Wt Readings from Last 3 Encounters:  09/06/20 (!) 352 lb 12.8 oz (160 kg)  06/03/20 (!) 350 lb (158.8 kg)  03/03/20 (!) 347 lb 1.6 oz (157.4 kg)     Health Maintenance Due  Topic Date Due  . OPHTHALMOLOGY EXAM  Never done  . PNA vac Low Risk Adult (1 of 2 - PCV13) Never done  . COVID-19 Vaccine (2 - Pfizer risk 4-dose series) 12/21/2019  . INFLUENZA VACCINE  02/14/2020    There are no preventive care reminders to display for this patient.  Lab Results  Component Value Date   TSH 1.570 11/02/2019   Lab Results  Component Value Date   WBC 4.3 08/31/2020   HGB 13.0 08/31/2020   HCT 38.6 08/31/2020   MCV 92 08/31/2020   PLT 170 08/31/2020   Lab Results  Component Value Date   NA 139 08/31/2020   K 4.5 08/31/2020   CO2 23 08/31/2020   GLUCOSE 173 (H) 08/31/2020   BUN 15 08/31/2020   CREATININE 0.97 08/31/2020   BILITOT 0.3 08/31/2020   ALKPHOS 71 08/31/2020   AST 31 08/31/2020   ALT 34 08/31/2020   PROT 6.5 08/31/2020   ALBUMIN 4.4 08/31/2020   CALCIUM 9.3 08/31/2020   ANIONGAP 6 06/12/2018   Lab Results  Component Value Date   CHOL 151 08/31/2020   Lab Results  Component Value Date   HDL 33 (L) 08/31/2020   Lab Results  Component Value Date   LDLCALC 92 08/31/2020   Lab Results  Component Value  Date   TRIG 145 08/31/2020   Lab Results  Component Value Date   CHOLHDL 4.6 08/31/2020   Lab Results  Component Value Date   HGBA1C 6.7 (H) 08/31/2020      Assessment & Plan:   Problem List Items Addressed This Visit      Cardiovascular and Mediastinum   Hypertension associated with diabetes ([place]) (Chronic)   Relevant Medications   rosuvastatin (CRESTOR) 10 MG tablet     Endocrine   Type 2 diabetes mellitus with complication, without long-term current use of insulin (HCC) - Primary (Chronic)   Relevant Medications   rosuvastatin (CRESTOR) 10 MG tablet   Mixed diabetic hyperlipidemia associated with type 2 diabetes mellitus (HCC) (Chronic)   Relevant Medications   rosuvastatin (CRESTOR) 10 MG tablet     Other   Morbid (severe) obesity due to excess calories (HCC) (Chronic)     Type 2 diabetes mellitus with complication, without long-term current use of insulin:  -Recent A1c 6.7, improved from 6.9. -Continue current medication regimen. -Continue with dietary change and reduce carbohydrates/glucose, continue with physical activity regimen. -Will continue to monitor.  Hypertension associated with diabetes: -BP mildly elevated, BP recheck improved and fairly at goal. -Continue current medication regimen. -Recommend to resume ambulatory BP/pulse monitoring and keep a log. -Recent CMP renal function and electrolytes wnl. -Will continue to monitor.  Mixed diabetic hyperlipidemia associated with type 2 diabetes mellitus:  -Recent  lipid panel: total cholesterol 151, triglycerides 145, HDL 33, LDL 92  -Discussed with patient indications for starting statin therapy and potential medication side effects. With existing co-morbidities LDL goal is <70 mg/dL. Patient is agreeable to start rosuvastatin 10 mg. Recent hepatic function normal. Advised to schedule lab visit in 6 weeks to repeat lipid panel and hepatic function. -Encourage to continue with weight loss efforts, follow a  heart healthy diet and continue with exercise regimen. -Will continue to monitor.  Morbid (severe) obesity due to excess calories: -Encourage to continue weight loss efforts with diet and lifestyle modifications, ultimate goal is 150 minutes/wk of moderate physical activity.   Meds ordered this encounter  Medications  . rosuvastatin (CRESTOR) 10 MG tablet    Sig: Take 1 tablet (10 mg total) by mouth daily.    Dispense:  90 tablet    Refill:  0    Order Specific Question:   Supervising Provider    Answer:   Beatrice Lecher D [[id number]    Follow-up: Return in about 4 months (around 01/04/2021) for HTN, DM, HLD and lab visit in 6 weeks to repeat lipid panel, hepatic function.    Lorrene Reid, PA-C

## 2020-09-06 NOTE — Patient Instructions (Signed)

## 2020-09-14 ENCOUNTER — Other Ambulatory Visit: Payer: Self-pay | Admitting: Physician Assistant

## 2020-09-14 DIAGNOSIS — E1169 Type 2 diabetes mellitus with other specified complication: Secondary | ICD-10-CM

## 2020-09-14 DIAGNOSIS — M159 Polyosteoarthritis, unspecified: Secondary | ICD-10-CM

## 2020-09-15 DIAGNOSIS — Z79899 Other long term (current) drug therapy: Secondary | ICD-10-CM | POA: Diagnosis not present

## 2020-09-15 DIAGNOSIS — M47816 Spondylosis without myelopathy or radiculopathy, lumbar region: Secondary | ICD-10-CM | POA: Diagnosis not present

## 2020-09-15 DIAGNOSIS — M25569 Pain in unspecified knee: Secondary | ICD-10-CM | POA: Diagnosis not present

## 2020-09-15 DIAGNOSIS — G894 Chronic pain syndrome: Secondary | ICD-10-CM | POA: Diagnosis not present

## 2020-09-15 DIAGNOSIS — M545 Low back pain, unspecified: Secondary | ICD-10-CM | POA: Diagnosis not present

## 2020-09-15 DIAGNOSIS — Z79891 Long term (current) use of opiate analgesic: Secondary | ICD-10-CM | POA: Diagnosis not present

## 2020-10-06 DIAGNOSIS — M1711 Unilateral primary osteoarthritis, right knee: Secondary | ICD-10-CM | POA: Diagnosis not present

## 2020-10-13 ENCOUNTER — Other Ambulatory Visit: Payer: Self-pay | Admitting: Physician Assistant

## 2020-10-13 DIAGNOSIS — Z0000 Encounter for general adult medical examination without abnormal findings: Secondary | ICD-10-CM

## 2020-10-13 DIAGNOSIS — I152 Hypertension secondary to endocrine disorders: Secondary | ICD-10-CM

## 2020-10-13 DIAGNOSIS — E782 Mixed hyperlipidemia: Secondary | ICD-10-CM

## 2020-10-13 DIAGNOSIS — E1169 Type 2 diabetes mellitus with other specified complication: Secondary | ICD-10-CM

## 2020-10-18 ENCOUNTER — Other Ambulatory Visit: Payer: Medicare Other

## 2020-10-18 ENCOUNTER — Other Ambulatory Visit: Payer: Self-pay

## 2020-10-18 DIAGNOSIS — E1169 Type 2 diabetes mellitus with other specified complication: Secondary | ICD-10-CM | POA: Diagnosis not present

## 2020-10-18 DIAGNOSIS — I152 Hypertension secondary to endocrine disorders: Secondary | ICD-10-CM | POA: Diagnosis not present

## 2020-10-18 DIAGNOSIS — Z79891 Long term (current) use of opiate analgesic: Secondary | ICD-10-CM | POA: Diagnosis not present

## 2020-10-18 DIAGNOSIS — Z79899 Other long term (current) drug therapy: Secondary | ICD-10-CM | POA: Diagnosis not present

## 2020-10-18 DIAGNOSIS — M25569 Pain in unspecified knee: Secondary | ICD-10-CM | POA: Diagnosis not present

## 2020-10-18 DIAGNOSIS — E1159 Type 2 diabetes mellitus with other circulatory complications: Secondary | ICD-10-CM

## 2020-10-18 DIAGNOSIS — Z0000 Encounter for general adult medical examination without abnormal findings: Secondary | ICD-10-CM

## 2020-10-18 DIAGNOSIS — M47816 Spondylosis without myelopathy or radiculopathy, lumbar region: Secondary | ICD-10-CM | POA: Diagnosis not present

## 2020-10-18 DIAGNOSIS — M545 Low back pain, unspecified: Secondary | ICD-10-CM | POA: Diagnosis not present

## 2020-10-18 DIAGNOSIS — E782 Mixed hyperlipidemia: Secondary | ICD-10-CM | POA: Diagnosis not present

## 2020-10-18 DIAGNOSIS — G894 Chronic pain syndrome: Secondary | ICD-10-CM | POA: Diagnosis not present

## 2020-10-19 ENCOUNTER — Telehealth: Payer: Self-pay | Admitting: Physician Assistant

## 2020-10-19 DIAGNOSIS — M159 Polyosteoarthritis, unspecified: Secondary | ICD-10-CM

## 2020-10-19 LAB — HEPATIC FUNCTION PANEL
ALT: 28 IU/L (ref 0–44)
AST: 21 IU/L (ref 0–40)
Albumin: 4.6 g/dL (ref 3.8–4.8)
Alkaline Phosphatase: 68 IU/L (ref 44–121)
Bilirubin Total: 0.4 mg/dL (ref 0.0–1.2)
Bilirubin, Direct: <0.1 mg/dL (ref 0.00–0.40)
Total Protein: 6.8 g/dL (ref 6.0–8.5)

## 2020-10-19 LAB — LIPID PANEL
Chol/HDL Ratio: 2.9 ratio (ref 0.0–5.0)
Cholesterol, Total: 131 mg/dL (ref 100–199)
HDL: 45 mg/dL (ref >39)
LDL Chol Calc (NIH): 69 mg/dL (ref 0–99)
Triglycerides: 88 mg/dL (ref 0–149)
VLDL Cholesterol Cal: 17 mg/dL (ref 5–40)

## 2020-10-19 MED ORDER — CYCLOBENZAPRINE HCL 10 MG PO TABS: 1.0000 | ORAL_TABLET | Freq: Two times a day (BID) | ORAL | 0 refills | Status: DC

## 2020-10-19 NOTE — Addendum Note (Signed)
Addended by: Sylvester Harder on: 10/19/2020 02:46 PM   Modules accepted: Orders

## 2020-10-19 NOTE — Telephone Encounter (Signed)
Flexaril sent to requested pharmacy.   Tramadol is prescribed by pain management. AS< CMA

## 2020-10-19 NOTE — Telephone Encounter (Signed)
Patient left a voicemail stating he needs refills on cyclobenzaprine and tramadol. [facility] [place] [facility] [address]. Please advise, thanks.

## 2020-10-19 NOTE — Telephone Encounter (Signed)
Left msg for patient to call back. AS, CMA 

## 2020-10-28 ENCOUNTER — Other Ambulatory Visit: Payer: Self-pay | Admitting: Physician Assistant

## 2020-10-28 DIAGNOSIS — E782 Mixed hyperlipidemia: Secondary | ICD-10-CM

## 2020-10-28 DIAGNOSIS — E1169 Type 2 diabetes mellitus with other specified complication: Secondary | ICD-10-CM

## 2020-11-14 ENCOUNTER — Other Ambulatory Visit: Payer: Self-pay | Admitting: Physician Assistant

## 2020-11-14 DIAGNOSIS — M159 Polyosteoarthritis, unspecified: Secondary | ICD-10-CM

## 2020-11-15 DIAGNOSIS — M545 Low back pain, unspecified: Secondary | ICD-10-CM | POA: Diagnosis not present

## 2020-11-15 DIAGNOSIS — M25569 Pain in unspecified knee: Secondary | ICD-10-CM | POA: Diagnosis not present

## 2020-11-15 DIAGNOSIS — G894 Chronic pain syndrome: Secondary | ICD-10-CM | POA: Diagnosis not present

## 2020-11-15 DIAGNOSIS — M47816 Spondylosis without myelopathy or radiculopathy, lumbar region: Secondary | ICD-10-CM | POA: Diagnosis not present

## 2020-11-22 DIAGNOSIS — G4733 Obstructive sleep apnea (adult) (pediatric): Secondary | ICD-10-CM | POA: Diagnosis not present

## 2020-11-23 ENCOUNTER — Other Ambulatory Visit: Payer: Self-pay | Admitting: Physician Assistant

## 2020-12-07 ENCOUNTER — Other Ambulatory Visit: Payer: Self-pay | Admitting: Physician Assistant

## 2020-12-07 DIAGNOSIS — I152 Hypertension secondary to endocrine disorders: Secondary | ICD-10-CM

## 2020-12-07 DIAGNOSIS — M159 Polyosteoarthritis, unspecified: Secondary | ICD-10-CM

## 2020-12-07 DIAGNOSIS — E118 Type 2 diabetes mellitus with unspecified complications: Secondary | ICD-10-CM

## 2020-12-15 ENCOUNTER — Other Ambulatory Visit: Payer: Self-pay | Admitting: Physician Assistant

## 2020-12-15 DIAGNOSIS — E1169 Type 2 diabetes mellitus with other specified complication: Secondary | ICD-10-CM

## 2020-12-15 DIAGNOSIS — M961 Postlaminectomy syndrome, not elsewhere classified: Secondary | ICD-10-CM | POA: Diagnosis not present

## 2020-12-15 DIAGNOSIS — G894 Chronic pain syndrome: Secondary | ICD-10-CM | POA: Diagnosis not present

## 2020-12-15 DIAGNOSIS — Z79899 Other long term (current) drug therapy: Secondary | ICD-10-CM | POA: Diagnosis not present

## 2020-12-15 DIAGNOSIS — M47817 Spondylosis without myelopathy or radiculopathy, lumbosacral region: Secondary | ICD-10-CM | POA: Diagnosis not present

## 2020-12-15 DIAGNOSIS — Z79891 Long term (current) use of opiate analgesic: Secondary | ICD-10-CM | POA: Diagnosis not present

## 2020-12-23 ENCOUNTER — Other Ambulatory Visit: Payer: Self-pay | Admitting: Physician Assistant

## 2020-12-27 ENCOUNTER — Telehealth: Payer: Self-pay | Admitting: Physician Assistant

## 2020-12-27 NOTE — Telephone Encounter (Signed)
Patient would like to inquire about Ozempic for his diabetes and if it is safer for him and if it can possibly help him lose weight? Please advise, thanks.

## 2020-12-30 ENCOUNTER — Encounter: Payer: Self-pay | Admitting: Physician Assistant

## 2020-12-30 ENCOUNTER — Other Ambulatory Visit: Payer: Self-pay

## 2020-12-30 ENCOUNTER — Ambulatory Visit (INDEPENDENT_AMBULATORY_CARE_PROVIDER_SITE_OTHER): Payer: Medicare Other | Admitting: Physician Assistant

## 2020-12-30 VITALS — BP 130/73 | HR 76 | Temp 97.4°F | Ht 71.75 in | Wt 351.3 lb

## 2020-12-30 DIAGNOSIS — E782 Mixed hyperlipidemia: Secondary | ICD-10-CM

## 2020-12-30 DIAGNOSIS — E1159 Type 2 diabetes mellitus with other circulatory complications: Secondary | ICD-10-CM

## 2020-12-30 DIAGNOSIS — E1169 Type 2 diabetes mellitus with other specified complication: Secondary | ICD-10-CM | POA: Diagnosis not present

## 2020-12-30 DIAGNOSIS — I152 Hypertension secondary to endocrine disorders: Secondary | ICD-10-CM | POA: Diagnosis not present

## 2020-12-30 DIAGNOSIS — E118 Type 2 diabetes mellitus with unspecified complications: Secondary | ICD-10-CM

## 2020-12-30 LAB — POCT GLYCOSYLATED HEMOGLOBIN (HGB A1C): Hemoglobin A1C: 6.4 % — AB (ref 4.0–5.6)

## 2020-12-30 MED ORDER — OZEMPIC (0.25 OR 0.5 MG/DOSE) 2 MG/1.5ML ~~LOC~~ SOPN: PEN_INJECTOR | SUBCUTANEOUS | 2 refills | Status: DC

## 2020-12-30 NOTE — Progress Notes (Signed)
Established Patient Office Visit  Subjective:  Patient ID: Robert [name], male    DOB: [birth year]  Age: 65 y.o. MRN: [id number]  CC:  Chief Complaint  Patient presents with   Diabetes   Hypertension   Hyperlipidemia    HPI Robert [name] presents for follow-up on diabetes mellitus, hyperlipidemia and hypertension.  Diabetes: Pt denies increased urination or thirst. Pt reports medication compliance.  Patient has not been checking glucose at home. Reports has reduced carbohydrates and sugar (ice cream, desserts) intake.  HTN: Pt denies chest pain, palpitations, dizziness or edema. Taking medication as directed without side effects.  Not checking BP at home. Pt follows a low salt diet.  HLD: Pt reports decided not to start cholesterol medication due to concerns of potential side effects including dementia.  Will continue to work on weight loss and watching his diet.  Weight: Reports is going to the [employer] 3 times per week but his weight has remained about the same which is discouraging. Is inquiring about Ozempic.  Past Medical History:  Diagnosis Date   Allergic rhinitis due to pollen    Arthritis    Asthma    Back pain    Barrett esophagus    Depressive disorder    Diabetes mellitus type 2 in obese (HCC)    Exposure to hepatitis B    Exposure to hepatitis C    Narcotic abuse (HCC)    pain medications   OSA (obstructive sleep apnea)    on CPAP     Past Surgical History:  Procedure Laterality Date   ABDOMINAL SURGERY     AMPUTATION Right 06/11/2018   Procedure: RIGHT 2ND RAY AMPUTATION;  Surgeon: Newt Minion, MD;  Location: [facility];  Service: Orthopedics;  Laterality: Right;   BACK SURGERY     Rods and screws in lumbar area   COLONOSCOPY     ESOPHAGOGASTRODUODENOSCOPY  multiple   HERNIA REPAIR     6   JOINT REPLACEMENT     TOTAL KNEE ARTHROPLASTY  2009   left    Family History  Problem Relation Age of Onset   Allergies Mother    Early death Father    Thyroid  disease Sister    Colon cancer Neg Hx    Stomach cancer Neg Hx     Social History   Socioeconomic History   Marital status: Married    Spouse name: Not on file   Number of children: 2   Years of education: Not on file   Highest education level: Not on file  Occupational History   Occupation: Musician, Disabled  Tobacco Use   Smoking status: Former    Packs/day: 0.80    Years: 15.00    Pack years: 12.00    Types: Cigarettes    Quit date: 07/16/2004    Years since quitting: 16.4   Smokeless tobacco: Never  Vaping Use   Vaping Use: Never used  Substance and Sexual Activity   Alcohol use: No   Drug use: No    Comment: Prior abuse of narcotics   Sexual activity: Never  Other Topics Concern   Not on file  Social History Narrative   Married, 2 daughters born 2003 and 2004. 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 [place] history of living in working in [place].   6 caffeinated beverages daily.   01/21/2017   Social Determinants of Health   Financial Resource Strain: Not on file  Food Insecurity:  Not on file  Transportation Needs: Not on file  Physical Activity: Not on file  Stress: Not on file  Social Connections: Not on file  Intimate Partner Violence: Not on file    Outpatient Medications Prior to Visit  Medication Sig Dispense Refill   albuterol (VENTOLIN HFA) 108 (90 Base) MCG/ACT inhaler INHALE 1-2 PUFFS BY MOUTH EVERY 6 HOURS AS NEEDED FOR WHEEZE OR SHORTNESS OF BREATH. 8.5 each 0   APPLE CIDER VINEGAR PO Take 15 mLs by mouth 2 (two) times daily.      B Complex Vitamins (B COMPLEX 100 PO) Take 1 capsule by mouth every other day.      blood glucose meter kit and supplies KIT Dispense based on patient and insurance preference. Use up to four times daily as directed. (FOR ICD-9 250.00, 250.01). 1 each 0   blood glucose meter kit and supplies Dispense based on patient and insurance preference. Use to check glucose fasting in the AM and then after  largest meal of the day.. (FOR ICD-10 E10.9, E11.9). 1 each 0   Blood Glucose Monitoring Suppl (ONE TOUCH ULTRA MINI) w/Device KIT Use to test blood sugar, test in the morning fasting and test after largest meal of the day. 1 each 0   CALCIUM-MAGNESIUM-ZINC PO Take 1 tablet by mouth every other day.     Cholecalciferol (VITAMIN D3) 5000 units TABS 5,000 IU OTC vitamin D3 daily. (Patient taking differently: Take 5,000 Units by mouth daily.) 90 tablet 3   cyclobenzaprine (FLEXERIL) 10 MG tablet TAKE 1 TABLET BY MOUTH TWICE A DAY 180 tablet 0   DANDELION PO Take 1 tablet by mouth every other day.     glucose blood (ONE TOUCH ULTRA TEST) test strip Use to test blood sugar, test in the morning fasting and test after largest meal of the day. 100 each 11   meloxicam (MOBIC) 15 MG tablet TAKE 1 TABLET BY MOUTH EVERY DAY 90 tablet 0   metFORMIN (GLUCOPHAGE) 500 MG tablet TAKE 2 TABLETS BY MOUTH TWICE A DAY 360 tablet 0   NARCAN 4 MG/0.1ML LIQD nasal spray kit 1 SPRAY AS NEEDED     Olmesartan-amLODIPine-HCTZ 20-5-12.5 MG TABS TAKE 1 TABLET BY MOUTH EVERY DAY 90 tablet 0   ONETOUCH DELICA LANCETS 10C MISC Use for testing blood sugar, test once in the morning fasting and test after largest meal of the day. 100 each 11   Potassium 99 MG TABS Take 99 mg by mouth every other day.      traMADol (ULTRAM) 50 MG tablet Take 2 tablets (100 mg total) by mouth every 6 (six) hours as needed. For pain 30 tablet    TURMERIC CURCUMIN PO Take 1 tablet by mouth every other day.     valACYclovir (VALTREX) 1000 MG tablet TAKE 1 TABLET (1,000 MG TOTAL) BY MOUTH DAILY AS NEEDED (FOR OUTBREAKS ONLY). 5 tablet 0   vitamin B-12 1000 MCG tablet Take 1 tablet (1,000 mcg total) by mouth daily. 30 tablet 0   vitamin C (ASCORBIC ACID) 500 MG tablet Take 500 mg by mouth daily.     VITAMIN E PO Take 1 tablet by mouth daily.     rosuvastatin (CRESTOR) 10 MG tablet TAKE 1 TABLET BY MOUTH EVERY DAY (Patient not taking: Reported on 12/30/2020)  90 tablet 0   No facility-administered medications prior to visit.    No Known Allergies  ROS Review of Systems A fourteen system review of systems was performed and found to be  positive as per HPI.   Objective:    Physical Exam General:  Well Developed, well nourished, in no acute distress, using cane for assistance  Neuro:  Alert and oriented,  extra-ocular muscles intact  HEENT:  Normocephalic, atraumatic, neck supple Skin:  no gross rash, warm, pink. Cardiac:  RRR Respiratory:  ECTA B/L w/o wheezing, Not using accessory muscles, speaking in full sentences- unlabored. Vascular:  Ext warm, no cyanosis apprec.; cap RF less 2 sec. Psych:  No HI/SI, judgement and insight good, Euthymic mood. Full Affect.  BP 130/73   Pulse 76   Temp (!) 97.4 F (36.3 C)   Ht 5' 11.75" (1.822 m)   Wt (!) 351 lb 4.8 oz (159.3 kg)   SpO2 96%   BMI 47.98 kg/m  Wt Readings from Last 3 Encounters:  12/30/20 (!) 351 lb 4.8 oz (159.3 kg)  09/06/20 (!) 352 lb 12.8 oz (160 kg)  06/03/20 (!) 350 lb (158.8 kg)     Health Maintenance Due  Topic Date Due   OPHTHALMOLOGY EXAM  Never done   Zoster Vaccines- Shingrix (1 of 2) Never done   PNA vac Low Risk Adult (1 of 2 - PCV13) Never done   COVID-19 Vaccine (2 - Pfizer risk series) 12/21/2019    There are no preventive care reminders to display for this patient.  Lab Results  Component Value Date   TSH 1.570 11/02/2019   Lab Results  Component Value Date   WBC 4.3 08/31/2020   HGB 13.0 08/31/2020   HCT 38.6 08/31/2020   MCV 92 08/31/2020   PLT 170 08/31/2020   Lab Results  Component Value Date   NA 139 08/31/2020   K 4.5 08/31/2020   CO2 23 08/31/2020   GLUCOSE 173 (H) 08/31/2020   BUN 15 08/31/2020   CREATININE 0.97 08/31/2020   BILITOT 0.4 10/18/2020   ALKPHOS 68 10/18/2020   AST 21 10/18/2020   ALT 28 10/18/2020   PROT 6.8 10/18/2020   ALBUMIN 4.6 10/18/2020   CALCIUM 9.3 08/31/2020   ANIONGAP 6 06/12/2018   Lab Results   Component Value Date   CHOL 131 10/18/2020   Lab Results  Component Value Date   HDL 45 10/18/2020   Lab Results  Component Value Date   LDLCALC 69 10/18/2020   Lab Results  Component Value Date   TRIG 88 10/18/2020   Lab Results  Component Value Date   CHOLHDL 2.9 10/18/2020   Lab Results  Component Value Date   HGBA1C 6.4 (A) 12/30/2020      Assessment & Plan:   Problem List Items Addressed This Visit       Cardiovascular and Mediastinum   Hypertension associated with diabetes ([place]) (Chronic)   Relevant Medications   Semaglutide,0.25 or 0.5MG/DOS, (OZEMPIC, 0.25 OR 0.5 MG/DOSE,) 2 MG/1.5ML SOPN     Endocrine   Type 2 diabetes mellitus with complication, without long-term current use of insulin (HCC) - Primary (Chronic)   Relevant Medications   Semaglutide,0.25 or 0.5MG/DOS, (OZEMPIC, 0.25 OR 0.5 MG/DOSE,) 2 MG/1.5ML SOPN   Other Relevant Orders   POCT glycosylated hemoglobin (Hb A1C) (Completed)   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 Discussed with patient GLP-1 RA including potential side effects and patient wants to trial medication. No personal family history of MTC or MEN. If Ozempic covered by insurance recommend to discontinue metformin to reduce risk of hypoglycemia due to overmedicating.  -Recommend to resume ambulatory glucose monitoring. -Recommend to continue with low carbohydrate and glucose diet.  Continue physical activity regimen. -Follow-up in 3 months.  Hypertension associated with  diabetes: -Fairly controlled. -Continue current medication regimen.  Last CMP renal function and electrolytes normal.  Will repeat CMP next OV. -Will continue to monitor.  Mixed diabetic hyperlipidemia associated with type 2 diabetes mellitus: -Last lipid panel: Total cholesterol 131, triglycerides 88, HDL 45, LDL 69 (improved from 92). -Recommend to continue weight loss efforts and follow a diet low in saturated and transfats.  Continue exercise regimen. -Will continue to monitor and will repeat lipid panel next OV.  Morbid obesity due to excess calories: -Associated with type 2 diabetes mellitus, hypertension and hyperlipidemia. -Encouraged to continue weight loss efforts with dietary and lifestyle changes. Will start GLP-1 RA which also has weight loss benefits. -Will continue to monitor.  Meds ordered this encounter  Medications   Semaglutide,0.25 or 0.5MG/DOS, (OZEMPIC, 0.25 OR 0.5 MG/DOSE,) 2 MG/1.5ML SOPN    Sig: Inject 0.72m into skin once weekly for 4 weeks, then inject 0.532minto skin once weekly.    Dispense:  1.5 mL    Refill:  2    Follow-up: Return in about 3 months (around 04/01/2021) for DM, HTN, HLD.   Note:  This note was prepared with assistance of Dragon voice recognition software. Occasional wrong-word or sound-a-like substitutions may have occurred due to the inherent limitations of voice recognition software.  MaLorrene ReidPA-C

## 2020-12-30 NOTE — Patient Instructions (Signed)
Diabetes Mellitus and Nutrition, Adult When you have diabetes, or diabetes mellitus, it is very important to have healthy eating habits because your blood sugar (glucose) levels are greatly affected by what you eat and drink. Eating healthy foods in the right amounts, at about the same times every day, can help you:  Control your blood glucose.  Lower your risk of heart disease.  Improve your blood pressure.  Reach or maintain a healthy weight. What can affect my meal plan? Every person with diabetes is different, and each person has different needs for a meal plan. Your health care provider may recommend that you work with a dietitian to make a meal plan that is best for you. Your meal plan may vary depending on factors such as:  The calories you need.  The medicines you take.  Your weight.  Your blood glucose, blood pressure, and cholesterol levels.  Your activity level.  Other health conditions you have, such as heart or kidney disease. How do carbohydrates affect me? Carbohydrates, also called carbs, affect your blood glucose level more than any other type of food. Eating carbs naturally raises the amount of glucose in your blood. Carb counting is a method for keeping track of how many carbs you eat. Counting carbs is important to keep your blood glucose at a healthy level, especially if you use insulin or take certain oral diabetes medicines. It is important to know how many carbs you can safely have in each meal. This is different for every person. Your dietitian can help you calculate how many carbs you should have at each meal and for each snack. How does alcohol affect me? Alcohol can cause a sudden decrease in blood glucose (hypoglycemia), especially if you use insulin or take certain oral diabetes medicines. Hypoglycemia can be a life-threatening condition. Symptoms of hypoglycemia, such as sleepiness, dizziness, and confusion, are similar to symptoms of having too much  alcohol.  Do not drink alcohol if: ? Your health care provider tells you not to drink. ? You are pregnant, may be pregnant, or are planning to become pregnant.  If you drink alcohol: ? Do not drink on an empty stomach. ? Limit how much you use to:  0-1 drink a day for women.  0-2 drinks a day for men. ? Be aware of how much alcohol is in your drink. In the [place] one drink equals one 12 oz bottle of beer (355 mL), one 5 oz glass of wine (148 mL), or one 1 oz glass of hard liquor (44 mL). ? Keep yourself hydrated with water, diet soda, or unsweetened iced tea.  Keep in mind that regular soda, juice, and other mixers may contain a lot of sugar and must be counted as carbs. What are tips for following this plan? Reading food labels  Start by checking the serving size on the "Nutrition Facts" label of packaged foods and drinks. The amount of calories, carbs, fats, and other nutrients listed on the label is based on one serving of the item. Many items contain more than one serving per package.  Check the total grams (g) of carbs in one serving. You can calculate the number of servings of carbs in one serving by dividing the total carbs by 15. For example, if a food has 30 g of total carbs per serving, it would be equal to 2 servings of carbs.  Check the number of grams (g) of saturated fats and trans fats in one serving. Choose foods that have   a low amount or none of these fats.  Check the number of milligrams (mg) of salt (sodium) in one serving. Most people should limit total sodium intake to less than 2,300 mg per day.  Always check the nutrition information of foods labeled as "low-fat" or "nonfat." These foods may be higher in added sugar or refined carbs and should be avoided.  Talk to your dietitian to identify your daily goals for nutrients listed on the label. Shopping  Avoid buying canned, pre-made, or processed foods. These foods tend to be high in fat, sodium, and added  sugar.  Shop around the outside edge of the grocery store. This is where you will most often find fresh fruits and vegetables, bulk grains, fresh meats, and fresh dairy. Cooking  Use low-heat cooking methods, such as baking, instead of high-heat cooking methods like deep frying.  Cook using healthy oils, such as olive, canola, or sunflower oil.  Avoid cooking with butter, cream, or high-fat meats. Meal planning  Eat meals and snacks regularly, preferably at the same times every day. Avoid going long periods of time without eating.  Eat foods that are high in fiber, such as fresh fruits, vegetables, beans, and whole grains. Talk with your dietitian about how many servings of carbs you can eat at each meal.  Eat 4-6 oz (112-168 g) of lean protein each day, such as lean meat, chicken, fish, eggs, or tofu. One ounce (oz) of lean protein is equal to: ? 1 oz (28 g) of meat, chicken, or fish. ? 1 egg. ?  cup (62 g) of tofu.  Eat some foods each day that contain healthy fats, such as avocado, nuts, seeds, and fish.   What foods should I eat? Fruits Berries. Apples. Oranges. Peaches. Apricots. Plums. Grapes. Mango. Papaya. Pomegranate. Kiwi. Cherries. Vegetables Lettuce. Spinach. Leafy greens, including kale, chard, collard greens, and mustard greens. Beets. Cauliflower. Cabbage. Broccoli. Carrots. Green beans. Tomatoes. Peppers. Onions. Cucumbers. Brussels sprouts. Grains Whole grains, such as whole-wheat or whole-grain bread, crackers, tortillas, cereal, and pasta. Unsweetened oatmeal. Quinoa. Brown or wild rice. Meats and other proteins Seafood. Poultry without skin. Lean cuts of poultry and beef. Tofu. Nuts. Seeds. Dairy Low-fat or fat-free dairy products such as milk, yogurt, and cheese. The items listed above may not be a complete list of foods and beverages you can eat. Contact a dietitian for more information. What foods should I avoid? Fruits Fruits canned with  syrup. Vegetables Canned vegetables. Frozen vegetables with butter or cream sauce. Grains Refined white flour and flour products such as bread, pasta, snack foods, and cereals. Avoid all processed foods. Meats and other proteins Fatty cuts of meat. Poultry with skin. Breaded or fried meats. Processed meat. Avoid saturated fats. Dairy Full-fat yogurt, cheese, or milk. Beverages Sweetened drinks, such as soda or iced tea. The items listed above may not be a complete list of foods and beverages you should avoid. Contact a dietitian for more information. Questions to ask a health care provider  Do I need to meet with a diabetes educator?  Do I need to meet with a dietitian?  What number can I call if I have questions?  When are the best times to check my blood glucose? Where to find more information:  American Diabetes Association: diabetes.org  Academy of Nutrition and Dietetics: www.eatright.org  National Institute of Diabetes and Digestive and Kidney Diseases: www.niddk.nih.gov  Association of Diabetes Care and Education Specialists: www.diabeteseducator.org Summary  It is important to have healthy eating   habits because your blood sugar (glucose) levels are greatly affected by what you eat and drink.  A healthy meal plan will help you control your blood glucose and maintain a healthy lifestyle.  Your health care provider may recommend that you work with a dietitian to make a meal plan that is best for you.  Keep in mind that carbohydrates (carbs) and alcohol have immediate effects on your blood glucose levels. It is important to count carbs and to use alcohol carefully. This information is not intended to replace advice given to you by your health care provider. Make sure you discuss any questions you have with your health care provider. Document Revised: 06/09/2019 Document Reviewed: 06/09/2019 Elsevier Patient Education  2021 Elsevier Inc.  

## 2021-01-02 ENCOUNTER — Other Ambulatory Visit: Payer: Self-pay | Admitting: Physician Assistant

## 2021-01-02 DIAGNOSIS — E1159 Type 2 diabetes mellitus with other circulatory complications: Secondary | ICD-10-CM

## 2021-01-02 DIAGNOSIS — M159 Polyosteoarthritis, unspecified: Secondary | ICD-10-CM

## 2021-01-05 ENCOUNTER — Ambulatory Visit: Payer: Medicare Other | Admitting: Physician Assistant

## 2021-01-13 DIAGNOSIS — M961 Postlaminectomy syndrome, not elsewhere classified: Secondary | ICD-10-CM | POA: Diagnosis not present

## 2021-01-13 DIAGNOSIS — M47817 Spondylosis without myelopathy or radiculopathy, lumbosacral region: Secondary | ICD-10-CM | POA: Diagnosis not present

## 2021-01-13 DIAGNOSIS — G894 Chronic pain syndrome: Secondary | ICD-10-CM | POA: Diagnosis not present

## 2021-01-26 DIAGNOSIS — H2513 Age-related nuclear cataract, bilateral: Secondary | ICD-10-CM | POA: Diagnosis not present

## 2021-01-26 DIAGNOSIS — H40033 Anatomical narrow angle, bilateral: Secondary | ICD-10-CM | POA: Diagnosis not present

## 2021-02-13 ENCOUNTER — Other Ambulatory Visit: Payer: Self-pay | Admitting: Physician Assistant

## 2021-02-13 DIAGNOSIS — E1159 Type 2 diabetes mellitus with other circulatory complications: Secondary | ICD-10-CM

## 2021-02-13 DIAGNOSIS — I152 Hypertension secondary to endocrine disorders: Secondary | ICD-10-CM

## 2021-02-13 DIAGNOSIS — M961 Postlaminectomy syndrome, not elsewhere classified: Secondary | ICD-10-CM | POA: Diagnosis not present

## 2021-02-13 DIAGNOSIS — M47817 Spondylosis without myelopathy or radiculopathy, lumbosacral region: Secondary | ICD-10-CM | POA: Diagnosis not present

## 2021-02-13 DIAGNOSIS — G894 Chronic pain syndrome: Secondary | ICD-10-CM | POA: Diagnosis not present

## 2021-02-13 DIAGNOSIS — M159 Polyosteoarthritis, unspecified: Secondary | ICD-10-CM

## 2021-02-19 ENCOUNTER — Other Ambulatory Visit: Payer: Self-pay | Admitting: Physician Assistant

## 2021-02-19 DIAGNOSIS — E118 Type 2 diabetes mellitus with unspecified complications: Secondary | ICD-10-CM

## 2021-02-20 DIAGNOSIS — G4733 Obstructive sleep apnea (adult) (pediatric): Secondary | ICD-10-CM | POA: Diagnosis not present

## 2021-03-03 ENCOUNTER — Other Ambulatory Visit: Payer: Self-pay | Admitting: Physician Assistant

## 2021-03-05 ENCOUNTER — Other Ambulatory Visit: Payer: Self-pay | Admitting: Physician Assistant

## 2021-03-05 DIAGNOSIS — E118 Type 2 diabetes mellitus with unspecified complications: Secondary | ICD-10-CM

## 2021-03-17 DIAGNOSIS — M47817 Spondylosis without myelopathy or radiculopathy, lumbosacral region: Secondary | ICD-10-CM | POA: Diagnosis not present

## 2021-03-17 DIAGNOSIS — G894 Chronic pain syndrome: Secondary | ICD-10-CM | POA: Diagnosis not present

## 2021-03-17 DIAGNOSIS — M961 Postlaminectomy syndrome, not elsewhere classified: Secondary | ICD-10-CM | POA: Diagnosis not present

## 2021-03-27 ENCOUNTER — Other Ambulatory Visit: Payer: Self-pay

## 2021-03-27 DIAGNOSIS — Z0000 Encounter for general adult medical examination without abnormal findings: Secondary | ICD-10-CM

## 2021-03-28 ENCOUNTER — Other Ambulatory Visit: Payer: Medicare Other

## 2021-03-28 ENCOUNTER — Other Ambulatory Visit: Payer: Self-pay

## 2021-03-28 DIAGNOSIS — Z0000 Encounter for general adult medical examination without abnormal findings: Secondary | ICD-10-CM

## 2021-03-29 LAB — COMPREHENSIVE METABOLIC PANEL
ALT: 30 IU/L (ref 0–44)
AST: 28 IU/L (ref 0–40)
Albumin/Globulin Ratio: 1.9 (ref 1.2–2.2)
Albumin: 4.2 g/dL (ref 3.8–4.8)
Alkaline Phosphatase: 67 IU/L (ref 44–121)
BUN/Creatinine Ratio: 13 (ref 10–24)
BUN: 12 mg/dL (ref 8–27)
Bilirubin Total: 0.4 mg/dL (ref 0.0–1.2)
CO2: 27 mmol/L (ref 20–29)
Calcium: 9.6 mg/dL (ref 8.6–10.2)
Chloride: 100 mmol/L (ref 96–106)
Creatinine, Ser: 0.92 mg/dL (ref 0.76–1.27)
Globulin, Total: 2.2 g/dL (ref 1.5–4.5)
Glucose: 116 mg/dL — ABNORMAL HIGH (ref 65–99)
Potassium: 4.5 mmol/L (ref 3.5–5.2)
Sodium: 138 mmol/L (ref 134–144)
Total Protein: 6.4 g/dL (ref 6.0–8.5)
eGFR: 91 mL/min/{1.73_m2} (ref >59)

## 2021-03-29 LAB — LIPID PANEL
Chol/HDL Ratio: 5.6 ratio — ABNORMAL HIGH (ref 0.0–5.0)
Cholesterol, Total: 161 mg/dL (ref 100–199)
HDL: 29 mg/dL — ABNORMAL LOW (ref >39)
LDL Chol Calc (NIH): 101 mg/dL — ABNORMAL HIGH (ref 0–99)
Triglycerides: 175 mg/dL — ABNORMAL HIGH (ref 0–149)
VLDL Cholesterol Cal: 31 mg/dL (ref 5–40)

## 2021-03-29 LAB — TSH: TSH: 1.67 u[IU]/mL (ref 0.450–4.500)

## 2021-03-29 LAB — CBC WITH DIFFERENTIAL/PLATELET
Basophils Absolute: 0 10*3/uL (ref 0.0–0.2)
Basos: 1 %
EOS (ABSOLUTE): 0.2 10*3/uL (ref 0.0–0.4)
Eos: 4 %
Hematocrit: 38.1 % (ref 37.5–51.0)
Hemoglobin: 13.2 g/dL (ref 13.0–17.7)
Immature Grans (Abs): 0 10*3/uL (ref 0.0–0.1)
Immature Granulocytes: 0 %
Lymphocytes Absolute: 1.2 10*3/uL (ref 0.7–3.1)
Lymphs: 29 %
MCH: 30.6 pg (ref 26.6–33.0)
MCHC: 34.6 g/dL (ref 31.5–35.7)
MCV: 88 fL (ref 79–97)
Monocytes Absolute: 0.5 10*3/uL (ref 0.1–0.9)
Monocytes: 12 %
Neutrophils Absolute: 2.3 10*3/uL (ref 1.4–7.0)
Neutrophils: 54 %
Platelets: 180 10*3/uL (ref 150–450)
RBC: 4.32 x10E6/uL (ref 4.14–5.80)
RDW: 12.3 % (ref 11.6–15.4)
WBC: 4.2 10*3/uL (ref 3.4–10.8)

## 2021-03-29 LAB — HEMOGLOBIN A1C
Est. average glucose Bld gHb Est-mCnc: 143 mg/dL
Hgb A1c MFr Bld: 6.6 % — ABNORMAL HIGH (ref 4.8–5.6)

## 2021-04-03 ENCOUNTER — Encounter: Payer: Self-pay | Admitting: Physician Assistant

## 2021-04-03 ENCOUNTER — Ambulatory Visit (INDEPENDENT_AMBULATORY_CARE_PROVIDER_SITE_OTHER): Payer: Medicare Other | Admitting: Physician Assistant

## 2021-04-03 ENCOUNTER — Other Ambulatory Visit: Payer: Self-pay

## 2021-04-03 VITALS — BP 111/73 | HR 64 | Temp 98.3°F | Ht 71.0 in | Wt 341.0 lb

## 2021-04-03 DIAGNOSIS — E118 Type 2 diabetes mellitus with unspecified complications: Secondary | ICD-10-CM

## 2021-04-03 DIAGNOSIS — E1169 Type 2 diabetes mellitus with other specified complication: Secondary | ICD-10-CM | POA: Diagnosis not present

## 2021-04-03 DIAGNOSIS — I152 Hypertension secondary to endocrine disorders: Secondary | ICD-10-CM | POA: Diagnosis not present

## 2021-04-03 DIAGNOSIS — E1159 Type 2 diabetes mellitus with other circulatory complications: Secondary | ICD-10-CM

## 2021-04-03 DIAGNOSIS — E782 Mixed hyperlipidemia: Secondary | ICD-10-CM | POA: Diagnosis not present

## 2021-04-03 NOTE — Assessment & Plan Note (Signed)
-  Associated diabetes mellitus, hypertension and hyperlipidemia. -Patient has lost 10 pounds since last visit. -Encourage to continue dietary and lifestyle changes. Patient on  GLP-1 which also has wt loss benefits.

## 2021-04-03 NOTE — Patient Instructions (Signed)

## 2021-04-03 NOTE — Progress Notes (Signed)
Established Patient Office Visit  Subjective:  Patient ID: Robert [name], male    DOB: 01-17-1956  Age: 65 y.o. MRN: [id number]  CC:  Chief Complaint  Patient presents with   Follow-up   Diabetes    Weight Check    HPI Robert [name] presents for follow up on diabetes mellitus, hypertension and weight management.  Diabetes: Pt denies increased urination or thirst. Pt reports medication compliance. States overall tolerating Ozempic. Reports having about 3 mild headaches since starting medication. No hypoglycemic events. Not checking glucose at home. Patient does report some increased personal stress with vehicle issue.  HTN: Pt denies chest pain, palpitations, dizziness or lower extremity swelling. Taking medication as directed without side effects. Pt follows a low salt diet.  HLD: Pt reports has increased dairy intake. Working on dietary and lifestyle modifications.   Weight: Patient reports continues to go to the [facility]. Due to having one car to rely on transportation was not going as frequently but has resumed since purchasing a new car. States has reduced pasta and pizza.  Past Medical History:  Diagnosis Date   Allergic rhinitis due to pollen    Arthritis    Asthma    Back pain    Barrett esophagus    Depressive disorder    Diabetes mellitus type 2 in obese (HCC)    Exposure to hepatitis B    Exposure to hepatitis C    Narcotic abuse (HCC)    pain medications   OSA (obstructive sleep apnea)    on CPAP     Past Surgical History:  Procedure Laterality Date   ABDOMINAL SURGERY     AMPUTATION Right 06/11/2018   Procedure: RIGHT 2ND RAY AMPUTATION;  Surgeon: Newt Minion, MD;  Location: [facility];  Service: Orthopedics;  Laterality: Right;   BACK SURGERY     Rods and screws in lumbar area   COLONOSCOPY     ESOPHAGOGASTRODUODENOSCOPY  multiple   HERNIA REPAIR     4   JOINT REPLACEMENT     TOTAL KNEE ARTHROPLASTY  2009   left    Family History  Problem Relation  Age of Onset   Allergies Mother    Early death Father    Thyroid disease Sister    Colon cancer Neg Hx    Stomach cancer Neg Hx     Social History   Socioeconomic History   Marital status: Married    Spouse name: Not on file   Number of children: 2   Years of education: Not on file   Highest education level: Not on file  Occupational History   Occupation: Musician, Disabled  Tobacco Use   Smoking status: Former    Packs/day: 0.80    Years: 15.00    Pack years: 12.00    Types: Cigarettes    Quit date: 07/16/2004    Years since quitting: 16.7   Smokeless tobacco: Never  Vaping Use   Vaping Use: Never used  Substance and Sexual Activity   Alcohol use: No   Drug use: No    Comment: Prior abuse of narcotics   Sexual activity: Never  Other Topics Concern   Not on file  Social History Narrative   Married, 2 daughters born 2003 and 2004. 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 [place] history of living in working in [place].   6 caffeinated beverages daily.   01/21/2017   Social Determinants of Radio broadcast assistant  Strain: Not on file  Food Insecurity: Not on file  Transportation Needs: Not on file  Physical Activity: Not on file  Stress: Not on file  Social Connections: Not on file  Intimate Partner Violence: Not on file    Outpatient Medications Prior to Visit  Medication Sig Dispense Refill   albuterol (VENTOLIN HFA) 108 (90 Base) MCG/ACT inhaler INHALE 1-2 PUFFS BY MOUTH EVERY 6 HOURS AS NEEDED FOR WHEEZE OR SHORTNESS OF BREATH. 8.5 each 0   APPLE CIDER VINEGAR PO Take 15 mLs by mouth 2 (two) times daily.      B Complex Vitamins (B COMPLEX 100 PO) Take 1 capsule by mouth every other day.      blood glucose meter kit and supplies Dispense based on patient and insurance preference. Use to check glucose fasting in the AM and then after largest meal of the day.. (FOR ICD-10 E10.9, E11.9). 1 each 0   Blood Glucose Monitoring Suppl (ONE  TOUCH ULTRA MINI) w/Device KIT Use to test blood sugar, test in the morning fasting and test after largest meal of the day. 1 each 0   CALCIUM-MAGNESIUM-ZINC PO Take 1 tablet by mouth every other day.     Cholecalciferol (VITAMIN D3) 5000 units TABS 5,000 IU OTC vitamin D3 daily. (Patient taking differently: Take 5,000 Units by mouth daily.) 90 tablet 3   cyclobenzaprine (FLEXERIL) 10 MG tablet TAKE 1 TABLET BY MOUTH TWICE A DAY 180 tablet 0   DANDELION PO Take 1 tablet by mouth every other day.     glucose blood (ONE TOUCH ULTRA TEST) test strip Use to test blood sugar, test in the morning fasting and test after largest meal of the day. 100 each 11   meloxicam (MOBIC) 15 MG tablet TAKE 1 TABLET BY MOUTH EVERY DAY 90 tablet 0   NARCAN 4 MG/0.1ML LIQD nasal spray kit 1 SPRAY AS NEEDED     Olmesartan-amLODIPine-HCTZ 20-5-12.5 MG TABS TAKE 1 TABLET BY MOUTH EVERY DAY 90 tablet 0   ONETOUCH DELICA LANCETS 92Z MISC Use for testing blood sugar, test once in the morning fasting and test after largest meal of the day. 100 each 11   OZEMPIC, 0.25 OR 0.5 MG/DOSE, 2 MG/1.5ML SOPN INJECT 0.25MG INTO SKIN ONCE WEEKLY FOR 4 WEEKS, THEN INJECT 0.5MG INTO SKIN ONCE WEEKLY. 1.5 mL 2   Potassium 99 MG TABS Take 99 mg by mouth every other day.      traMADol (ULTRAM) 50 MG tablet Take 2 tablets (100 mg total) by mouth every 6 (six) hours as needed. For pain 30 tablet    TURMERIC CURCUMIN PO Take 1 tablet by mouth every other day.     valACYclovir (VALTREX) 1000 MG tablet TAKE 1 TABLET BY MOUTH DAILY AS NEEDED (FOR OUTBREAKS ONLY). 5 tablet 0   vitamin B-12 1000 MCG tablet Take 1 tablet (1,000 mcg total) by mouth daily. 30 tablet 0   vitamin C (ASCORBIC ACID) 500 MG tablet Take 500 mg by mouth daily.     VITAMIN E PO Take 1 tablet by mouth daily.     blood glucose meter kit and supplies KIT Dispense based on patient and insurance preference. Use up to four times daily as directed. (FOR ICD-9 250.00, 250.01). 1 each 0    metFORMIN (GLUCOPHAGE) 500 MG tablet TAKE 2 TABLETS BY MOUTH TWICE A DAY 360 tablet 0   No facility-administered medications prior to visit.    No Known Allergies  ROS Review of Systems Review of  Systems:  A fourteen system review of systems was performed and found to be positive as per HPI.   Objective:    Physical Exam General:  Well Developed, well nourished, appropriate for stated age.  Neuro:  Alert and oriented,  extra-ocular muscles intact  HEENT:  Normocephalic, atraumatic, neck supple, no carotid bruits appreciated  Skin:  no gross rash, warm, pink. Cardiac:  RRR, S1 S2, no murmur heard Respiratory:  CTA B/L, Not using accessory muscles, speaking in full sentences- unlabored.  Vascular:  Ext warm, no cyanosis apprec.; cap RF less 2 sec. Psych:  No HI/SI, judgement and insight good, Euthymic mood. Full Affect.  BP 111/73   Pulse 64   Temp 98.3 F (36.8 C)   Ht _0  (1.803 m)   Wt (!) 341 lb (154.7 kg)   SpO2 99%   BMI 47.56 kg/m  Wt Readings from Last 3 Encounters:  04/03/21 (!) 341 lb (154.7 kg)  12/30/20 (!) 351 lb 4.8 oz (159.3 kg)  09/06/20 (!) 352 lb 12.8 oz (160 kg)     Health Maintenance Due  Topic Date Due   OPHTHALMOLOGY EXAM  Never done   Zoster Vaccines- Shingrix (1 of 2) Never done   COVID-19 Vaccine (2 - Pfizer risk series) 12/21/2019   INFLUENZA VACCINE  02/13/2021    There are no preventive care reminders to display for this patient.  Lab Results  Component Value Date   TSH 1.670 03/28/2021   Lab Results  Component Value Date   WBC 4.2 03/28/2021   HGB 13.2 03/28/2021   HCT 38.1 03/28/2021   MCV 88 03/28/2021   PLT 180 03/28/2021   Lab Results  Component Value Date   NA 138 03/28/2021   K 4.5 03/28/2021   CO2 27 03/28/2021   GLUCOSE 116 (H) 03/28/2021   BUN 12 03/28/2021   CREATININE 0.92 03/28/2021   BILITOT 0.4 03/28/2021   ALKPHOS 67 03/28/2021   AST 28 03/28/2021   ALT 30 03/28/2021   PROT 6.4 03/28/2021    ALBUMIN 4.2 03/28/2021   CALCIUM 9.6 03/28/2021   ANIONGAP 6 06/12/2018   EGFR 91 03/28/2021   Lab Results  Component Value Date   CHOL 161 03/28/2021   Lab Results  Component Value Date   HDL 29 (L) 03/28/2021   Lab Results  Component Value Date   LDLCALC 101 (H) 03/28/2021   Lab Results  Component Value Date   TRIG 175 (H) 03/28/2021   Lab Results  Component Value Date   CHOLHDL 5.6 (H) 03/28/2021   Lab Results  Component Value Date   HGBA1C 6.6 (H) 03/28/2021      Assessment & Plan:   Problem List Items Addressed This Visit       Cardiovascular and Mediastinum   Hypertension associated with diabetes ([place]) (Chronic)    -Stable.  -Continue current medication regimen. Recent CMP, renal function and electrolytes normal. -Will continue to monitor.        Endocrine   Type 2 diabetes mellitus with complication, without long-term current use of insulin (HCC) - Primary (Chronic)    -Recent A1c increased from 6.4 to 6.6 likely secondary to recent acute stress. Will continue current medication regimen and reassess A1c at follow up visit.  -Encourage to continue with reducing carbohydrate and glucose intake. Continue weight loss efforts and physical activity.      Mixed diabetic hyperlipidemia associated with type 2 diabetes mellitus (HCC) (Chronic)    -Discussed recent lipid panel: total  cholesterol 161, triglycerides 175, HDL 29, LDL 101 (last LDL 69). -Discussed with patient reducing saturated and trans fats including dairy. Will repeat lipid panel in 3 months, if lipid panel fails to improve and LDL not at goal of <70 then recommend to consider starting statin therapy. Patient verbalized understanding and agreeable.        Other   Morbid (severe) obesity due to excess calories (HCC) (Chronic)    -Associated diabetes mellitus, hypertension and hyperlipidemia. -Patient has lost 10 pounds since last visit. -Encourage to continue dietary and lifestyle changes.  Patient on  GLP-1 which also has wt loss benefits.      Discussed recent lab results. CBC, TSH are normal.  No orders of the defined types were placed in this encounter.   Follow-up: Return in about 3 months (around 07/03/2021) for MCW and FBW (lipid panel, a1c, cmp).    Lorrene Reid, PA-C

## 2021-04-03 NOTE — Assessment & Plan Note (Signed)
-  Discussed recent lipid panel: total cholesterol 161, triglycerides 175, HDL 29, LDL 101 (last LDL 69). -Discussed with patient reducing saturated and trans fats including dairy. Will repeat lipid panel in 3 months, if lipid panel fails to improve and LDL not at goal of <70 then recommend to consider starting statin therapy. Patient verbalized understanding and agreeable.

## 2021-04-03 NOTE — Assessment & Plan Note (Signed)
-  Stable. -Continue current medication regimen. Recent CMP, renal function and electrolytes normal. -Will continue to monitor. 

## 2021-04-03 NOTE — Assessment & Plan Note (Signed)
-  Recent A1c increased from 6.4 to 6.6 likely secondary to recent acute stress. Will continue current medication regimen and reassess A1c at follow up visit.  -Encourage to continue with reducing carbohydrate and glucose intake. Continue weight loss efforts and physical activity.

## 2021-04-10 DIAGNOSIS — E1151 Type 2 diabetes mellitus with diabetic peripheral angiopathy without gangrene: Secondary | ICD-10-CM | POA: Diagnosis not present

## 2021-04-17 DIAGNOSIS — M47817 Spondylosis without myelopathy or radiculopathy, lumbosacral region: Secondary | ICD-10-CM | POA: Diagnosis not present

## 2021-04-17 DIAGNOSIS — M961 Postlaminectomy syndrome, not elsewhere classified: Secondary | ICD-10-CM | POA: Diagnosis not present

## 2021-04-17 DIAGNOSIS — G894 Chronic pain syndrome: Secondary | ICD-10-CM | POA: Diagnosis not present

## 2021-04-22 ENCOUNTER — Other Ambulatory Visit: Payer: Self-pay | Admitting: Physician Assistant

## 2021-05-12 ENCOUNTER — Other Ambulatory Visit: Payer: Self-pay | Admitting: Physician Assistant

## 2021-05-12 DIAGNOSIS — M159 Polyosteoarthritis, unspecified: Secondary | ICD-10-CM

## 2021-05-15 ENCOUNTER — Other Ambulatory Visit: Payer: Self-pay

## 2021-05-15 ENCOUNTER — Ambulatory Visit: Payer: Medicare Other | Admitting: Podiatry

## 2021-05-15 ENCOUNTER — Encounter: Payer: Self-pay | Admitting: Podiatry

## 2021-05-15 DIAGNOSIS — E118 Type 2 diabetes mellitus with unspecified complications: Secondary | ICD-10-CM

## 2021-05-15 DIAGNOSIS — E11621 Type 2 diabetes mellitus with foot ulcer: Secondary | ICD-10-CM | POA: Diagnosis not present

## 2021-05-15 DIAGNOSIS — L97522 Non-pressure chronic ulcer of other part of left foot with fat layer exposed: Secondary | ICD-10-CM | POA: Diagnosis not present

## 2021-05-15 DIAGNOSIS — L03032 Cellulitis of left toe: Secondary | ICD-10-CM

## 2021-05-15 MED ORDER — DOXYCYCLINE HYCLATE 100 MG PO TABS: 100.0000 mg | ORAL_TABLET | Freq: Two times a day (BID) | ORAL | 0 refills | Status: AC

## 2021-05-15 MED ORDER — MUPIROCIN 2 % EX OINT: 1.0000 "application " | TOPICAL_OINTMENT | Freq: Two times a day (BID) | CUTANEOUS | 2 refills | Status: DC

## 2021-05-15 NOTE — Progress Notes (Signed)
  Subjective:  Patient ID: Robert [name], male    DOB: Jan 29, 1956,   MRN: [id number]  Chief Complaint  Patient presents with   Diabetes    Diabetic exam  Left foot 3rd toe has a sore     65 y.o. male presents for diabetic foot exam and concern for sore on his left third toe that he has had for several weeks. Has been treating himself at home and putting mupirocin oitnment. Was given doxycycline by his PCP. Relates not pain. Also has ingrown and infectionin in the left great toe that has been there for two weeks.  . Denies any other pedal complaints. Denies n/v/f/c.   Past Medical History:  Diagnosis Date   Allergic rhinitis due to pollen    Arthritis    Asthma    Back pain    Barrett esophagus    Depressive disorder    Diabetes mellitus type 2 in obese (HCC)    Exposure to hepatitis B    Exposure to hepatitis C    Narcotic abuse (HCC)    pain medications   OSA (obstructive sleep apnea)    on CPAP     Objective:  Physical Exam: Vascular: DP/PT pulses 2/4 bilateral. CFT <3 seconds. Normal hair growth on digits. No edema.  Skin. No lacerations or abrasions bilateral feet. Incurvation of medial border left hallux with granulartion tissue and erythema and edema. Left third digit with granular wound measuirng 0.6 x 0.6 x 0.4 cm with hypekeratosis overlying. Mild erythema and edema noted to toe. No purulence and no probe to bone.  Musculoskeletal: MMT 5/5 bilateral lower extremities in DF, PF, Inversion and Eversion. Deceased ROM in DF of ankle joint.  Neurological: Sensation intact to light touch.   Assessment:   1. Ulcer of left second toe, with fat layer exposed (HCC)   2. Type 2 diabetes mellitus with foot ulcer (CODE) (HCC)   3. Type 2 diabetes mellitus with complication, without long-term current use of insulin (HCC)   4. Paronychia of great toe of left foot      Plan:  Patient was evaluated and treated and all questions answered. Ulcer right third digit with fat layer  exoposed  -Debridement as below. -Dressed with neosporin, DSD. -Prescription for neosporin sent to pharmacy -Off-loading with surgical shoe. -Refilled doxycycline. -Discussed glucose control and proper protein-rich diet.  -Paronychia on left great toe was cut back on medial border without incident. Granular tissue was removed and area was irrigated and dressed with neosporing and DSD.  -Discussed if any worsening redness, pain, fever or chills to call or may need to report to the emergency room. Patient expressed understanding.   Procedure: Excisional Debridement of Wound Rationale: Removal of non-viable soft tissue from the wound to promote healing.  Anesthesia: none Pre-Debridement Wound Measurements: Hyperkeratotic tissue  Post-Debridement Wound Measurements: 0.6 cm x 0.6 cm x 0.4 cm  Type of Debridement: Sharp Excisional Tissue Removed: Non-viable soft tissue Depth of Debridement: subcutaneous tissue. Technique: Sharp excisional debridement to bleeding, viable wound base.  Dressing: Dry, sterile, compression dressing. Disposition: Patient tolerated procedure well. Patient to return in 2 week for follow-up.  Return in about 2 weeks (around 05/29/2021) for wound check.   Louann Sjogren, DPM

## 2021-05-16 ENCOUNTER — Telehealth: Payer: Self-pay | Admitting: *Deleted

## 2021-05-16 ENCOUNTER — Other Ambulatory Visit: Payer: Self-pay | Admitting: Physician Assistant

## 2021-05-16 DIAGNOSIS — E1159 Type 2 diabetes mellitus with other circulatory complications: Secondary | ICD-10-CM

## 2021-05-16 DIAGNOSIS — M159 Polyosteoarthritis, unspecified: Secondary | ICD-10-CM

## 2021-05-16 DIAGNOSIS — I152 Hypertension secondary to endocrine disorders: Secondary | ICD-10-CM

## 2021-05-16 NOTE — Telephone Encounter (Signed)
Error

## 2021-05-16 NOTE — Telephone Encounter (Signed)
Patient is calling and wants to know how long does he have to wear his boot given 1 day ago.Please advise.

## 2021-05-16 NOTE — Telephone Encounter (Signed)
Returned call to patient and gave information per Dr Ralene Cork, verbalized understanding

## 2021-05-18 ENCOUNTER — Other Ambulatory Visit: Payer: Self-pay | Admitting: Physician Assistant

## 2021-05-18 DIAGNOSIS — M47816 Spondylosis without myelopathy or radiculopathy, lumbar region: Secondary | ICD-10-CM | POA: Diagnosis not present

## 2021-05-18 DIAGNOSIS — Z79899 Other long term (current) drug therapy: Secondary | ICD-10-CM | POA: Diagnosis not present

## 2021-05-18 DIAGNOSIS — Z79891 Long term (current) use of opiate analgesic: Secondary | ICD-10-CM | POA: Diagnosis not present

## 2021-05-18 DIAGNOSIS — G894 Chronic pain syndrome: Secondary | ICD-10-CM | POA: Diagnosis not present

## 2021-05-18 DIAGNOSIS — M25569 Pain in unspecified knee: Secondary | ICD-10-CM | POA: Diagnosis not present

## 2021-05-18 DIAGNOSIS — M159 Polyosteoarthritis, unspecified: Secondary | ICD-10-CM

## 2021-05-22 DIAGNOSIS — G4733 Obstructive sleep apnea (adult) (pediatric): Secondary | ICD-10-CM | POA: Diagnosis not present

## 2021-05-23 ENCOUNTER — Other Ambulatory Visit: Payer: Self-pay | Admitting: Physician Assistant

## 2021-05-29 ENCOUNTER — Other Ambulatory Visit: Payer: Self-pay

## 2021-05-29 ENCOUNTER — Encounter: Payer: Self-pay | Admitting: Podiatry

## 2021-05-29 ENCOUNTER — Ambulatory Visit: Payer: Medicare Other | Admitting: Podiatry

## 2021-05-29 ENCOUNTER — Other Ambulatory Visit: Payer: Self-pay | Admitting: Physician Assistant

## 2021-05-29 DIAGNOSIS — E11621 Type 2 diabetes mellitus with foot ulcer: Secondary | ICD-10-CM

## 2021-05-29 DIAGNOSIS — E1159 Type 2 diabetes mellitus with other circulatory complications: Secondary | ICD-10-CM

## 2021-05-29 DIAGNOSIS — L97521 Non-pressure chronic ulcer of other part of left foot limited to breakdown of skin: Secondary | ICD-10-CM

## 2021-05-29 DIAGNOSIS — E118 Type 2 diabetes mellitus with unspecified complications: Secondary | ICD-10-CM

## 2021-05-29 DIAGNOSIS — I152 Hypertension secondary to endocrine disorders: Secondary | ICD-10-CM

## 2021-05-29 NOTE — Progress Notes (Signed)
  Subjective:  Patient ID: Robert [name], male    DOB: 11/02/1955,   MRN: [id number]  Chief Complaint  Patient presents with   Foot Ulcer    The 3rd toe on the left is doing better and is a little red today due to collecting wood to bring into the house and there is not any draining and the surgical shoe does help    65 y.o. male presents for follow up of left third toe ulcer. Relates he is doing better and shoe helps. States he has been dressing as instructed and has a couple more days of antibiotics. Denies any pain. Relates improvement of wounds.  . Denies any other pedal complaints. Denies n/v/f/c.   Past Medical History:  Diagnosis Date   Allergic rhinitis due to pollen    Arthritis    Asthma    Back pain    Barrett esophagus    Depressive disorder    Diabetes mellitus type 2 in obese (HCC)    Exposure to hepatitis B    Exposure to hepatitis C    Narcotic abuse (HCC)    pain medications   OSA (obstructive sleep apnea)    on CPAP     Objective:  Physical Exam: Vascular: DP/PT pulses 2/4 bilateral. CFT <3 seconds. Normal hair growth on digits. No edema.  Skin. No lacerations or abrasions bilateral feet. Left hallux medial border mild erythema but improved.  Left third digit with granular wound measuirng 0.3 x 0.2 x 0.2 cm with hypekeratosis overlying. Mild erythema and edema noted to toe improved. No purulence and no probe to bone.  Musculoskeletal: MMT 5/5 bilateral lower extremities in DF, PF, Inversion and Eversion. Deceased ROM in DF of ankle joint.  Neurological: Sensation intact to light touch.   Assessment:   1. Skin ulcer of third toe of left foot, limited to breakdown of skin (HCC)   2. Type 2 diabetes mellitus with complication, without long-term current use of insulin (HCC)   3. Type 2 diabetes mellitus with foot ulcer (CODE) (HCC)       Plan:  Patient was evaluated and treated and all questions answered. Ulcer right third digit limited to breakdown of skin.   -Debridement as below. -Dressed with neosporin, DSD. -Continue with dressing.  -Off-loading with surgical shoe. -Continue doxycyline.  -Discussed glucose control and proper protein-rich diet.  -Left great ingrown toenail appears improved.  -Discussed if any worsening redness, pain, fever or chills to call or may need to report to the emergency room. Patient expressed understanding.   Procedure: Excisional Debridement of Wound Rationale: Removal of non-viable soft tissue from the wound to promote healing.  Anesthesia: none Pre-Debridement Wound Measurements: Hyperkeratotic tissue  Post-Debridement Wound Measurements: 0.3 cm x 0.2 cm x 0.2 cm  Type of Debridement: Sharp Excisional Tissue Removed: Non-viable soft tissue Depth of Debridement: subcutaneous tissue. Technique: Sharp excisional debridement to bleeding, viable wound base.  Dressing: Dry, sterile, compression dressing. Disposition: Patient tolerated procedure well. Patient to return in 2 week for follow-up.  Return in about 2 weeks (around 06/12/2021) for wound check.   Louann Sjogren, DPM

## 2021-06-12 ENCOUNTER — Ambulatory Visit: Payer: Medicare Other | Admitting: Podiatry

## 2021-06-15 DIAGNOSIS — M961 Postlaminectomy syndrome, not elsewhere classified: Secondary | ICD-10-CM | POA: Diagnosis not present

## 2021-06-15 DIAGNOSIS — M47817 Spondylosis without myelopathy or radiculopathy, lumbosacral region: Secondary | ICD-10-CM | POA: Diagnosis not present

## 2021-06-15 DIAGNOSIS — G894 Chronic pain syndrome: Secondary | ICD-10-CM | POA: Diagnosis not present

## 2021-06-29 ENCOUNTER — Other Ambulatory Visit: Payer: Self-pay | Admitting: Physician Assistant

## 2021-06-29 DIAGNOSIS — I152 Hypertension secondary to endocrine disorders: Secondary | ICD-10-CM

## 2021-06-30 DIAGNOSIS — M1711 Unilateral primary osteoarthritis, right knee: Secondary | ICD-10-CM | POA: Diagnosis not present

## 2021-07-03 ENCOUNTER — Other Ambulatory Visit: Payer: Medicare Other

## 2021-07-06 ENCOUNTER — Ambulatory Visit: Payer: Medicare Other | Admitting: Physician Assistant

## 2021-07-18 DIAGNOSIS — M961 Postlaminectomy syndrome, not elsewhere classified: Secondary | ICD-10-CM | POA: Diagnosis not present

## 2021-07-18 DIAGNOSIS — G894 Chronic pain syndrome: Secondary | ICD-10-CM | POA: Diagnosis not present

## 2021-07-18 DIAGNOSIS — M47817 Spondylosis without myelopathy or radiculopathy, lumbosacral region: Secondary | ICD-10-CM | POA: Diagnosis not present

## 2021-07-21 ENCOUNTER — Other Ambulatory Visit: Payer: Self-pay | Admitting: Physician Assistant

## 2021-07-21 DIAGNOSIS — E1159 Type 2 diabetes mellitus with other circulatory complications: Secondary | ICD-10-CM

## 2021-07-26 DIAGNOSIS — G629 Polyneuropathy, unspecified: Secondary | ICD-10-CM | POA: Diagnosis not present

## 2021-07-26 DIAGNOSIS — I10 Essential (primary) hypertension: Secondary | ICD-10-CM | POA: Diagnosis not present

## 2021-07-26 DIAGNOSIS — E1169 Type 2 diabetes mellitus with other specified complication: Secondary | ICD-10-CM | POA: Diagnosis not present

## 2021-07-26 DIAGNOSIS — E1140 Type 2 diabetes mellitus with diabetic neuropathy, unspecified: Secondary | ICD-10-CM | POA: Diagnosis not present

## 2021-07-26 DIAGNOSIS — Z0000 Encounter for general adult medical examination without abnormal findings: Secondary | ICD-10-CM | POA: Diagnosis not present

## 2021-07-26 DIAGNOSIS — R209 Unspecified disturbances of skin sensation: Secondary | ICD-10-CM | POA: Diagnosis not present

## 2021-07-26 DIAGNOSIS — R5383 Other fatigue: Secondary | ICD-10-CM | POA: Diagnosis not present

## 2021-08-17 ENCOUNTER — Other Ambulatory Visit: Payer: Self-pay | Admitting: Physician Assistant

## 2021-08-17 DIAGNOSIS — E1159 Type 2 diabetes mellitus with other circulatory complications: Secondary | ICD-10-CM

## 2021-08-17 DIAGNOSIS — I152 Hypertension secondary to endocrine disorders: Secondary | ICD-10-CM

## 2021-08-18 DIAGNOSIS — E1140 Type 2 diabetes mellitus with diabetic neuropathy, unspecified: Secondary | ICD-10-CM | POA: Diagnosis not present

## 2021-08-18 DIAGNOSIS — M2550 Pain in unspecified joint: Secondary | ICD-10-CM | POA: Diagnosis not present

## 2021-08-18 DIAGNOSIS — M109 Gout, unspecified: Secondary | ICD-10-CM | POA: Diagnosis not present

## 2021-08-21 DIAGNOSIS — G4733 Obstructive sleep apnea (adult) (pediatric): Secondary | ICD-10-CM | POA: Diagnosis not present

## 2021-08-23 ENCOUNTER — Other Ambulatory Visit: Payer: Self-pay | Admitting: Physician Assistant

## 2021-08-23 DIAGNOSIS — I152 Hypertension secondary to endocrine disorders: Secondary | ICD-10-CM

## 2021-08-23 DIAGNOSIS — E1159 Type 2 diabetes mellitus with other circulatory complications: Secondary | ICD-10-CM

## 2021-08-30 DIAGNOSIS — G4733 Obstructive sleep apnea (adult) (pediatric): Secondary | ICD-10-CM | POA: Diagnosis not present

## 2021-08-30 DIAGNOSIS — M961 Postlaminectomy syndrome, not elsewhere classified: Secondary | ICD-10-CM | POA: Diagnosis not present

## 2021-08-30 DIAGNOSIS — M109 Gout, unspecified: Secondary | ICD-10-CM | POA: Diagnosis not present

## 2021-08-30 DIAGNOSIS — G894 Chronic pain syndrome: Secondary | ICD-10-CM | POA: Diagnosis not present

## 2021-09-08 DIAGNOSIS — U071 COVID-19: Secondary | ICD-10-CM | POA: Diagnosis not present

## 2021-09-08 DIAGNOSIS — Z20822 Contact with and (suspected) exposure to covid-19: Secondary | ICD-10-CM | POA: Diagnosis not present

## 2021-09-21 ENCOUNTER — Other Ambulatory Visit: Payer: Self-pay | Admitting: Physician Assistant

## 2021-10-03 ENCOUNTER — Other Ambulatory Visit: Payer: Self-pay | Admitting: Physician Assistant

## 2021-10-03 DIAGNOSIS — I152 Hypertension secondary to endocrine disorders: Secondary | ICD-10-CM

## 2021-10-22 ENCOUNTER — Other Ambulatory Visit: Payer: Self-pay | Admitting: Physician Assistant

## 2021-10-23 ENCOUNTER — Other Ambulatory Visit: Payer: Self-pay | Admitting: Physician Assistant

## 2021-10-23 DIAGNOSIS — I152 Hypertension secondary to endocrine disorders: Secondary | ICD-10-CM

## 2021-11-01 ENCOUNTER — Other Ambulatory Visit (HOSPITAL_COMMUNITY): Payer: Self-pay | Admitting: General Surgery

## 2021-11-01 ENCOUNTER — Ambulatory Visit (HOSPITAL_COMMUNITY)
Admission: RE | Admit: 2021-11-01 | Discharge: 2021-11-01 | Disposition: A | Discharge status: Home / Self Care | Payer: Medicare Other | Source: Ambulatory Visit | Attending: General Surgery | Admitting: General Surgery

## 2021-11-01 ENCOUNTER — Encounter (HOSPITAL_BASED_OUTPATIENT_CLINIC_OR_DEPARTMENT_OTHER): Payer: Medicare Other | Attending: General Surgery | Admitting: General Surgery

## 2021-11-01 DIAGNOSIS — E11621 Type 2 diabetes mellitus with foot ulcer: Secondary | ICD-10-CM | POA: Insufficient documentation

## 2021-11-01 DIAGNOSIS — L97522 Non-pressure chronic ulcer of other part of left foot with fat layer exposed: Secondary | ICD-10-CM | POA: Insufficient documentation

## 2021-11-01 DIAGNOSIS — Z89421 Acquired absence of other right toe(s): Secondary | ICD-10-CM | POA: Diagnosis not present

## 2021-11-01 DIAGNOSIS — Z6841 Body Mass Index (BMI) 40.0 and over, adult: Secondary | ICD-10-CM | POA: Diagnosis not present

## 2021-11-01 DIAGNOSIS — I10 Essential (primary) hypertension: Secondary | ICD-10-CM | POA: Insufficient documentation

## 2021-11-01 DIAGNOSIS — E118 Type 2 diabetes mellitus with unspecified complications: Secondary | ICD-10-CM | POA: Diagnosis present

## 2021-11-01 DIAGNOSIS — I872 Venous insufficiency (chronic) (peripheral): Secondary | ICD-10-CM | POA: Diagnosis not present

## 2021-11-01 NOTE — Progress Notes (Signed)
[name], Jacy D. ([id number]) ?Visit Report for 11/01/2021 ?Allergy List Details ?Patient Name: Date of Service: ?Robert [name], [name] 11/01/2021 9:00 A M ?Medical Record Number: [id number] ?Patient Account Number: [id number] ?Date of Birth/Sex: Treating RN: ?06/10/1956 (66 y.o. [name] ?Primary Care Shawntrice Salle: PRA CTICE, PLEA SA NT Other Clinician: ?Referring Braydee Shimkus: ?Treating Athen Riel/Extender: Duanne Guess ?PRA CTICE, PLEA SA NT ?Weeks in Treatment: 0 ?Allergies ?Active Allergies ?No Known Allergies ?Allergy Notes ?Electronic Signature(s) ?Signed: 11/01/2021 4:07:39 PM By: Samuella Bruin ?Entered By: Samuella Bruin on 11/01/2021 09:16:37 ?-------------------------------------------------------------------------------- ?Arrival Information Details ?Patient Name: Date of Service: ?Robert [name], [name] 11/01/2021 9:00 A M ?Medical Record Number: [id number] ?Patient Account Number: [id number] ?Date of Birth/Sex: Treating RN: ?07-09-1956 (66 y.o. [name] ?Primary Care Joban Colledge: PRA CTICE, PLEA SA NT Other Clinician: ?Referring Dirk Vanaman: ?Treating Juanisha Bautch/Extender: Duanne Guess ?PRA CTICE, PLEA SA NT ?Weeks in Treatment: 0 ?Visit Information ?Patient Arrived: Gilmer Mor ?Arrival Time: 09:11 ?Accompanied By: self ?Transfer Assistance: None ?Patient Identification Verified: Yes ?Secondary Verification Process Completed: Yes ?Patient Requires Transmission-Based Precautions: No ?Patient Has Alerts: No ?Electronic Signature(s) ?Signed: 11/01/2021 4:07:39 PM By: Samuella Bruin ?Entered By: Samuella Bruin on 11/01/2021 09:12:10 ?-------------------------------------------------------------------------------- ?Clinic Level of Care Assessment Details ?Patient Name: Date of Service: ?Robert [name], [name] 11/01/2021 9:00 A M ?Medical Record Number: [id number] ?Patient Account Number: [id number] ?Date of Birth/Sex: Treating RN: ?13-Sep-1955 (66 y.o. [name] ?Primary Care Woodruff Skirvin: PRA CTICE, PLEA SA NT  Other Clinician: ?Referring Jilda Kress: ?Treating Dejanae Helser/Extender: Duanne Guess ?PRA CTICE, PLEA SA NT ?Weeks in Treatment: 0 ?Clinic Level of Care Assessment Items ?TOOL 1 Quantity Score ?X- 1 0 ?Use when EandM and Procedure is performed on INITIAL visit ?ASSESSMENTS - Nursing Assessment / Reassessment ?X- 1 20 ?General Physical Exam (combine w/ comprehensive assessment (listed just below) when performed on new pt. evals) ?X- 1 25 ?Comprehensive Assessment (HX, ROS, Risk Assessments, Wounds Hx, etc.) ?ASSESSMENTS - Wound and Skin Assessment / Reassessment ?[]  - 0 ?Dermatologic / Skin Assessment (not related to wound area) ?ASSESSMENTS - Ostomy and/or Continence Assessment and Care ?[]  - 0 ?Incontinence Assessment and Management ?[]  - 0 ?Ostomy Care Assessment and Management (repouching, etc.) ?PROCESS - Coordination of Care ?X - Simple Patient / Family Education for ongoing care 1 15 ?[]  - 0 ?Complex (extensive) Patient / Family Education for ongoing care ?X- 1 10 ?Staff obtains Consents, Records, T Results / Process Orders ?est ?[]  - 0 ?Staff telephones HHA, Nursing Homes / Clarify orders / etc ?[]  - 0 ?Routine Transfer to another Facility (non-emergent condition) ?[]  - 0 ?Routine Hospital Admission (non-emergent condition) ?X- 1 15 ?New Admissions / / Ordering NPWT Apligraf, etc. ?, ?[]  - 0 ?Emergency Hospital Admission (emergent condition) ?PROCESS - Special Needs ?[]  - 0 ?Pediatric / Minor Patient Management ?[]  - 0 ?Isolation Patient Management ?[]  - 0 ?Hearing / Language / Visual special needs ?[]  - 0 ?Assessment of Community assistance (transportation, D/C planning, etc.) ?[]  - 0 ?Additional assistance / Altered mentation ?[]  - 0 ?Support Surface(s) Assessment (bed, cushion, seat, etc.) ?INTERVENTIONS - Miscellaneous ?[]  - 0 ?External ear exam ?[]  - 0 ?Patient Transfer (multiple staff / / Similar devices) ?[]  - 0 ?Simple Staple / Suture removal (25 or less) ?[]  -  0 ?Complex Staple / Suture removal (26 or more) ?[]  - 0 ?Hypo/Hyperglycemic Management (do not check if billed separately) ?X- 1 15 ?Ankle / Brachial Index (ABI) - do not check if billed separately ?Has the patient  been seen at the hospital within the last three years: Yes ?Total Score: 100 ?Level Of Care: New/Established - Level 3 ?Electronic Signature(s) ?Signed: 11/01/2021 4:07:39 PM By: Samuella Bruin ?Entered By: Samuella Bruin on 11/01/2021 10:45:37 ?-------------------------------------------------------------------------------- ?Encounter Discharge Information Details ?Patient Name: ?Date of Service: ?Robert [name], [name] 11/01/2021 9:00 A M ?Medical Record Number: [id number] ?Patient Account Number: [id number] ?Date of Birth/Sex: ?Treating RN: ?1956/06/17 (66 y.o. [name] ?Primary Care Zach Tietje: PRA CTICE, PLEA SA NT ?Other Clinician: ?Referring Semaje Kinker: ?Treating Nikayla Madaris/Extender: Duanne Guess ?PRA CTICE, PLEA SA NT ?Weeks in Treatment: 0 ?Encounter Discharge Information Items Post Procedure Vitals ?Discharge Condition: Stable ?Temperature (F): 97.9 ?Ambulatory Status: Gilmer Mor ?Pulse (bpm): 64 ?Discharge Destination: Home ?Respiratory Rate (breaths/min): 18 ?Transportation: Private Auto ?Blood Pressure (mmHg): 117/65 ?Accompanied By: self ?Schedule Follow-up Appointment: Yes ?Clinical Summary of Care: Patient Declined ?Electronic Signature(s) ?Signed: 11/01/2021 4:07:39 PM By: Samuella Bruin ?Entered By: Samuella Bruin on 11/01/2021 11:19:27 ?-------------------------------------------------------------------------------- ?Lower Extremity Assessment Details ?Patient Name: ?Date of Service: ?Robert [name], [name] 11/01/2021 9:00 A M ?Medical Record Number: [id number] ?Patient Account Number: [id number] ?Date of Birth/Sex: ?Treating RN: ?12/24/1955 (66 y.o. [name] ?Primary Care Kadyn Chovan: PRA CTICE, PLEA SA NT ?Other Clinician: ?Referring Nidal Rivet: ?Treating Elih Mooney/Extender: Duanne Guess ?PRA CTICE, PLEA SA NT ?Weeks in Treatment: 0 ?Edema Assessment ?Assessed: [Left: No] [Right: No] ?E[Left: dema] [Right: :] ?Calf ?Left: Right: ?Point of Measurement: From Medial Instep 49.2 cm ?Ankle ?Left: Right: ?Point of Measurement: From Medial Instep 29.1 cm ?Vascular Assessment ?Pulses: ?Dorsalis Pedis ?Palpable: [Left:Yes] ?Blood Pressure: ?Brachial: [Left:117] ?Dorsalis Pedis: 132 ?Ankle: ?Posterior Tibial: 98 ?Ankle Brachial Index: [Left:1.13] ?Electronic Signature(s) ?Signed: 11/01/2021 4:07:39 PM By: Samuella Bruin ?Signed: 11/01/2021 5:06:30 PM By: Zenaida Deed RN, BSN ?Entered By: Samuella Bruin on 11/01/2021 10:06:49 ?-------------------------------------------------------------------------------- ?Multi Wound Chart Details ?Patient Name: ?Date of Service: ?Robert [name], [name] 11/01/2021 9:00 A M ?Medical Record Number: [id number] ?Patient Account Number: [id number] ?Date of Birth/Sex: ?Treating RN: ?[birth year] (66 y.o. [name] ?Primary Care Amparo Donalson: PRA CTICE, PLEA SA NT ?Other Clinician: ?Referring Wilbon Obenchain: ?Treating Sarvesh Meddaugh/Extender: Duanne Guess ?PRA CTICE, PLEA SA NT ?Weeks in Treatment: 0 ?Vital Signs ?Height(in): 72 ?Pulse(bpm): 64 ?Weight(lbs): 327 ?Blood Pressure(mmHg): 117/65 ?Body Mass Index(BMI): 44.3 ?Temperature(??F): 97.9 ?Respiratory Rate(breaths/min): 18 ?Photos: [N/A:N/A] ?Plantar T Third ?oe N/A N/A ?Wound Location: ?Pressure Injury N/A N/A ?Wounding Event: ?Diabetic Wound/Ulcer of the Lower N/A N/A ?Primary Etiology: ?Extremity ?Chronic sinus problems/congestion, N/A N/A ?Comorbid History: ?Lymphedema, Asthma, Sleep Apnea, ?Hepatitis B, Hepatitis C, Type II ?Diabetes, Gout, Osteoarthritis, ?Osteomyelitis, Neuropathy ?02/13/2021 N/A N/A ?Date Acquired: ?0 N/A N/A ?Weeks of Treatment: ?Open N/A N/A ?Wound Status: ?No N/A N/A ?Wound Recurrence: ?0.8x0.7x0.2 N/A N/A ?Measurements L x W x D (cm) ?0.44 N/A N/A ?A (cm?) : ?rea ?0.088 N/A N/A ?Volume (cm?)  : ?0.00% N/A N/A ?% Reduction in A rea: ?0.00% N/A N/A ?% Reduction in Volume: ?10 ?Starting Position 1 (o'clock): ?11 ?Ending Position 1 (o'clock): ?0.2 ?Maximum Distance 1 (cm): ?1 ?Starting Position 2 (o'cloc

## 2021-11-01 NOTE — Progress Notes (Signed)
[name], [name] ([id number]) ?Visit Report for 11/01/2021 ?Abuse Risk Screen Details ?Patient Name: Date of Service: ?Robert [name], [name] 11/01/2021 9:00 A M ?Medical Record Number: [id number] ?Patient Account Number: [id number] ?Date of Birth/Sex: Treating RN: ?[birth year] (66 y.o. Robert [name] ?Primary Care Shiya Fogelman: PRA CTICE, PLEA SA NT Other Clinician: ?Referring Saida Lonon: ?Treating Makeya Hilgert/Extender: Duanne Guess ?PRA CTICE, PLEA SA NT ?Weeks in Treatment: 0 ?Abuse Risk Screen Items ?Answer ?ABUSE RISK SCREEN: ?Has anyone close to you tried to hurt or harm you recentlyo No ?Do you feel uncomfortable with anyone in your familyo No ?Has anyone forced you do things that you didnt want to doo No ?Electronic Signature(s) ?Signed: 11/01/2021 4:07:39 PM By: Samuella Bruin ?Signed: 11/01/2021 5:06:30 PM By: Zenaida Deed RN, BSN ?Entered By: Samuella Bruin on 11/01/2021 09:40:21 ?-------------------------------------------------------------------------------- ?Activities of Daily Living Details ?Patient Name: Date of Service: ?Robert [name], [name] 11/01/2021 9:00 A M ?Medical Record Number: [id number] ?Patient Account Number: [id number] ?Date of Birth/Sex: Treating RN: ?[birth year] (66 y.o. Robert [name] ?Primary Care Kimia Finan: PRA CTICE, PLEA SA NT Other Clinician: ?Referring Jilliana Burkes: ?Treating Eden Rho/Extender: Duanne Guess ?PRA CTICE, PLEA SA NT ?Weeks in Treatment: 0 ?Activities of Daily Living Items ?Answer ?Activities of Daily Living (Please select one for each item) ?Drive Automobile Completely Able ?T Medications ?ake Completely Able ?Use T elephone Completely Able ?Care for Appearance Completely Able ?Use T oilet Completely Able ?Bath / Shower Completely Able ?Dress Self Completely Able ?Feed Self Completely Able ?Walk Need Assistance ?Get In / Out Bed Completely Able ?Housework Completely Able ?Prepare Meals Completely Able ?Handle Money Completely Able ?Shop for Self Completely Able ?Electronic  Signature(s) ?Signed: 11/01/2021 4:07:39 PM By: Samuella Bruin ?Signed: 11/01/2021 5:06:30 PM By: Zenaida Deed RN, BSN ?Entered By: Samuella Bruin on 11/01/2021 09:41:18 ?-------------------------------------------------------------------------------- ?Education Screening Details ?Patient Name: ?Date of Service: ?Robert [name], [name] 11/01/2021 9:00 A M ?Medical Record Number: [id number] ?Patient Account Number: [id number] ?Date of Birth/Sex: ?Treating RN: ?Apr 16, 1956 (66 y.o. Robert [name] ?Primary Care Caleb Prigmore: PRA CTICE, PLEA SA NT ?Other Clinician: ?Referring Treasa Bradshaw: ?Treating Taiki Buckwalter/Extender: Duanne Guess ?PRA CTICE, PLEA SA NT ?Weeks in Treatment: 0 ?Learning Preferences/Education Level/Primary Language ?Learning Preference: Explanation, Demonstration, Video, Printed Material ?Highest Education Level: College or Above ?Preferred Language: English ?Cognitive Barrier ?Language Barrier: No ?Translator Needed: No ?Memory Deficit: No ?Emotional Barrier: No ?Cultural/Religious Beliefs Affecting Medical Care: No ?Physical Barrier ?Impaired Vision: Yes Glasses ?Impaired Hearing: No ?Decreased Hand dexterity: No ?Knowledge/Comprehension ?Knowledge Level: Medium ?Comprehension Level: Medium ?Ability to understand written instructions: Medium ?Ability to understand verbal instructions: Medium ?Motivation ?Anxiety Level: Calm ?Cooperation: Cooperative ?Education Importance: Acknowledges Need ?Interest in Health Problems: Asks Questions ?Perception: Coherent ?Willingness to Engage in Self-Management Medium ?Activities: ?Readiness to Engage in Self-Management Medium ?Activities: ?Electronic Signature(s) ?Signed: 11/01/2021 4:07:39 PM By: Samuella Bruin ?Signed: 11/01/2021 5:06:30 PM By: Zenaida Deed RN, BSN ?Entered By: Samuella Bruin on 11/01/2021 09:42:12 ?-------------------------------------------------------------------------------- ?Fall Risk Assessment Details ?Patient Name: ?Date of  Service: ?Robert [name], [name] 11/01/2021 9:00 A M ?Medical Record Number: [id number] ?Patient Account Number: [id number] ?Date of Birth/Sex: ?Treating RN: ?30-May-1956 (66 y.o. Robert [name] ?Primary Care Carma Dwiggins: PRA CTICE, PLEA SA NT ?Other Clinician: ?Referring Alexyia Guarino: ?Treating Tej Murdaugh/Extender: Duanne Guess ?PRA CTICE, PLEA SA NT ?Weeks in Treatment: 0 ?Fall Risk Assessment Items ?Have you had 2 or more falls in the last 12 monthso 0 Yes ?Have you had any fall that resulted in injury in the last 12 monthso 0 No ?FALLS RISK SCREEN ?  History of falling - immediate or within 3 months 25 Yes ?Secondary diagnosis (Do you have 2 or more medical diagnoseso) 15 Yes ?Ambulatory aid ?None/bed rest/wheelchair/nurse 0 No ?Crutches/cane/walker 15 Yes ?Furniture 30 Yes ?Intravenous therapy Access/Saline/Heparin Lock 0 No ?Gait/Transferring ?Normal/ bed rest/ wheelchair 0 No ?Weak (short steps with or without shuffle, stooped but able to lift head while walking, may seek 0 No ?support from furniture) ?Impaired (short steps with shuffle, may have difficulty arising from chair, head down, impaired 0 No ?balance) ?Mental Status ?Oriented to own ability 0 Yes ?Electronic Signature(s) ?Signed: 11/01/2021 4:07:39 PM By: Samuella Bruin ?Signed: 11/01/2021 5:06:30 PM By: Zenaida Deed RN, BSN ?Entered By: Samuella Bruin on 11/01/2021 09:43:26 ?-------------------------------------------------------------------------------- ?Foot Assessment Details ?Patient Name: ?Date of Service: ?Robert [name], [name] 11/01/2021 9:00 A M ?Medical Record Number: [id number] ?Patient Account Number: [id number] ?Date of Birth/Sex: ?Treating RN: ?05-Jan-1956 (66 y.o. Robert [name] ?Primary Care Kairav Russomanno: PRA CTICE, PLEA SA NT ?Other Clinician: ?Referring Jaton Eilers: ?Treating Katlynne Mckercher/Extender: Duanne Guess ?PRA CTICE, PLEA SA NT ?Weeks in Treatment: 0 ?Foot Assessment Items ?Site Locations ?+ = Sensation present, - = Sensation absent, C =  Callus, U = Ulcer ?R = Redness, W = Warmth, M = Maceration, PU = Pre-ulcerative lesion ?F = Fissure, S = Swelling, D = Dryness ?Assessment ?Right: Left: ?Other Deformity: No No ?Prior Foot Ulcer: No No ?Prior Amputation: No No ?Charcot Joint: No No ?Ambulatory Status: Ambulatory With Help ?Assistance Device: Cane ?Gait: Steady ?Electronic Signature(s) ?Signed: 11/01/2021 4:07:39 PM By: Samuella Bruin ?Signed: 11/01/2021 5:06:30 PM By: Zenaida Deed RN, BSN ?Entered By: Samuella Bruin on 11/01/2021 09:47:22 ?-------------------------------------------------------------------------------- ?Nutrition Risk Screening Details ?Patient Name: ?Date of Service: ?Robert [name], [name] 11/01/2021 9:00 A M ?Medical Record Number: [id number] ?Patient Account Number: [id number] ?Date of Birth/Sex: ?Treating RN: ?09/24/1955 (66 y.o. Robert [name] ?Primary Care Natassja Ollis: PRA CTICE, PLEA SA NT ?Other Clinician: ?Referring Addysin Porco: ?Treating Millard Bautch/Extender: Duanne Guess ?PRA CTICE, PLEA SA NT ?Weeks in Treatment: 0 ?Height (in): 72 ?Weight (lbs): 327 ?Body Mass Index (BMI): 44.3 ?Nutrition Risk Screening Items ?Score Screening ?NUTRITION RISK SCREEN: ?I have an illness or condition that made me change the kind and/or amount of food I eat 0 No ?I eat fewer than two meals per day 3 Yes ?I eat few fruits and vegetables, or milk products 0 No ?I have three or more drinks of beer, liquor or wine almost every day 0 No ?I have tooth or mouth problems that make it hard for me to eat 0 No ?I don't always have enough money to buy the food I need 0 No ?I eat alone most of the time 0 No ?I take three or more different prescribed or over-the-counter drugs a day 1 Yes ?Without wanting to, I have lost or gained 10 pounds in the last six months 0 No ?I am not always physically able to shop, cook and/or feed myself 0 No ?Nutrition Protocols ?Good Risk Protocol ?Moderate Risk Protocol 0 Provide education on nutrition ?High Risk  Proctocol ?Risk Level: Moderate Risk ?Score: 4 ?Electronic Signature(s) ?Signed: 11/01/2021 4:07:39 PM By: Samuella Bruin ?Signed: 11/01/2021 5:06:30 PM By: Zenaida Deed RN, BSN ?Entered By: Samuella Bruin on 04/

## 2021-11-01 NOTE — Progress Notes (Signed)
Robert [name], [name] ([id number]) ?Visit Report for 11/01/2021 ?Chief Complaint Document Details ?Patient Name: Date of Service: ?Robert [name], [name] 11/01/2021 9:00 A M ?Medical Record Number: [id number] ?Patient Account Number: [id number] ?Date of Birth/Sex: Treating RN: ?Jan 24, 1956 (66 y.o. [name] ?Primary Care Provider: PRA CTICE, PLEA SA NT Other Clinician: ?Referring Provider: ?Treating Provider/Extender: Duanne Guess ?PRA CTICE, PLEA SA NT ?Weeks in Treatment: 0 ?Information Obtained from: Patient ?Chief Complaint ?Patients presents for treatment of an open diabetic ulcer ?Electronic Signature(s) ?Signed: 11/01/2021 10:36:10 AM By: Duanne Guess MD FACS ?Entered By: Duanne Guess on 11/01/2021 10:36:10 ?-------------------------------------------------------------------------------- ?Debridement Details ?Patient Name: Date of Service: ?Robert [name], [name] 11/01/2021 9:00 A M ?Medical Record Number: [id number] ?Patient Account Number: [id number] ?Date of Birth/Sex: Treating RN: ?[birth year] (66 y.o. [name] ?Primary Care Provider: PRA CTICE, PLEA SA NT Other Clinician: ?Referring Provider: ?Treating Provider/Extender: Duanne Guess ?PRA CTICE, PLEA SA NT ?Weeks in Treatment: 0 ?Debridement Performed for Assessment: Wound #1 Plantar T Third ?oe ?Performed By: Physician Duanne Guess, MD ?Debridement Type: Debridement ?Severity of Tissue Pre Debridement: Fat layer exposed ?Level of Consciousness (Pre-procedure): Awake and Alert ?Pre-procedure Verification/Time Out Yes - 10:18 ?Taken: ?Start Time: 10:18 ?Pain Control: Lidocaine 4% T opical Solution ?T Area Debrided (L x W): ?otal 0.8 (cm) x 0.7 (cm) = 0.56 (cm?) ?Tissue and other material debrided: ?Viable, Non-Viable, Callus, Slough, Subcutaneous, Skin: Epidermis, Slough ?Level: Skin/Subcutaneous Tissue ?Debridement Description: Excisional ?Instrument: Curette ?Specimen: Tissue Culture ?Number of Specimens T aken: 1 ?Bleeding:  Minimum ?Hemostasis Achieved: Pressure ?Procedural Pain: 0 ?Post Procedural Pain: 0 ?Response to Treatment: Procedure was tolerated well ?Level of Consciousness (Post- Awake and Alert ?procedure): ?Post Debridement Measurements of Total Wound ?Length: (cm) 0.8 ?Width: (cm) 0.7 ?Depth: (cm) 0.2 ?Volume: (cm?) 0.088 ?Character of Wound/Ulcer Post Debridement: Improved ?Severity of Tissue Post Debridement: Fat layer exposed ?Post Procedure Diagnosis ?Same as Pre-procedure ?Electronic Signature(s) ?Signed: 11/01/2021 11:53:38 AM By: Duanne Guess MD FACS ?Signed: 11/01/2021 4:07:39 PM By: Samuella Bruin ?Signed: 11/01/2021 5:06:30 PM By: Zenaida Deed RN, BSN ?Entered By: Samuella Bruin on 11/01/2021 10:22:59 ?-------------------------------------------------------------------------------- ?HPI Details ?Patient Name: Date of Service: ?Robert [name], [name] 11/01/2021 9:00 A M ?Medical Record Number: [id number] ?Patient Account Number: [id number] ?Date of Birth/Sex: Treating RN: ?1956-01-31 (66 y.o. [name] ?Primary Care Provider: PRA CTICE, PLEA SA NT Other Clinician: ?Referring Provider: ?Treating Provider/Extender: Duanne Guess ?PRA CTICE, PLEA SA NT ?Weeks in Treatment: 0 ?History of Present Illness ?HPI Description: ADMISSION ?11/01/2021 ?This is a 66 year old man with a past medical history significant for type 2 diabetes mellitus, morbid obesity, hypertension, and venous insufficiency. 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 In August 2022, he developed an ulcer on his left third toe. His primary ?care provider has had him on several rounds of oral doxycycline. He is currently taking that now. He has been treating it at home with Betadine and hydrogen ?peroxide, as well as topical mupirocin ointment, also provided by his primary care provider. The most recent A1c that I have available to review was from ?September 2022 and was 6.6. ABI in  clinic today was normal at 1.13. ?On the plantar surface of his left third toe, there is a well-circumscribed ulcer with periwound callus. The surface is a little bit dry but otherwise clean. The toe ?itself is fairly red and swollen. ?Electronic Signature(s) ?Signed: 11/01/2021 10:41:24 AM By: Duanne Guess MD FACS ?Entered By: Duanne Guess on  11/01/2021 10:41:24 ?-------------------------------------------------------------------------------- ?Physical Exam Details ?Patient Name: Date of Service: ?Robert [name], [name] 11/01/2021 9:00 A M ?Medical Record Number: [id number] ?Patient Account Number: [id number] ?Date of Birth/Sex: Treating RN: ?Nov 22, 1955 (66 y.o. [name] ?Primary Care Provider: PRA CTICE, PLEA SA NT Other Clinician: ?Referring Provider: ?Treating Provider/Extender: Duanne Guess ?PRA CTICE, PLEA SA NT ?Weeks in Treatment: 0 ?Constitutional ?. . . . Morbid obesity. No acute distress. ?Respiratory ?Normal work of breathing on room air.Marland Kitchen ?Cardiovascular ?1+ pitting edema bilaterally.Marland Kitchen ?Notes ?11/01/2021: On the plantar surface of his left third toe, there is a circular ulcer with a slightly dry but otherwise clean base. There is granulation tissue. There is ?periwound callus. No drainage or odor. ?Electronic Signature(s) ?Signed: 11/01/2021 10:42:41 AM By: Duanne Guess MD FACS ?Entered By: Duanne Guess on 11/01/2021 10:42:41 ?-------------------------------------------------------------------------------- ?Physician Orders Details ?Patient Name: Date of Service: ?Robert [name], [name] 11/01/2021 9:00 A M ?Medical Record Number: [id number] ?Patient Account Number: [id number] ?Date of Birth/Sex: Treating RN: ?08-10-1955 (66 y.o. 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 - Dr. Lady Gary Room 1 ?Return A ?Bathing/ Shower/ Hygiene ?May shower and wash wound with soap and water. ?Edema Control - Lymphedema / SCD / Other ?Elevate legs to the level of the heart or above for 30 minutes daily and/or when sitting, a frequency of: ?Avoid standing for long periods of time. ?Patient to wear own compression stockings every day. ?Exercise regularly ?Off-Loading ?Wedge shoe to: - front off-loading shoe to left foot when ambulating ?Wound Treatment ?Wound #1 - T Third ?oe Wound Laterality: Plantar ?Cleanser: Wound Cleanser Every Other Day/30 Days ?Discharge Instructions: Cleanse the wound with wound cleanser prior to applying a clean dressing using gauze sponges, not tissue or cotton balls. ?Prim Dressing: Promogran Prisma Matrix, 4.34 (sq in) (silver collagen) Every Other Day/30 Days ?ary ?Discharge Instructions: Moisten collagen with saline or hydrogel ?Secondary Dressing: Optifoam Non-Adhesive Dressing, 4x4 in Every Other Day/30 Days ?Discharge Instructions: Apply over primary dressing cut to make foam doughnut ?Secondary Dressing: Woven Gauze Sponges 2x2 in Every Other Day/30 Days ?Discharge Instructions: Apply over primary dressing as directed. ?Secured With: Insurance underwriter, Sterile 2x75 (in/in) Every Other Day/30 Days ?Discharge Instructions: Secure with stretch gauze as directed. ?Laboratory ?naerobe culture (MICRO) - L third toe ?Bacteria identified in Unspecified specimen by A ?LOINC Code: 635-3 ?Convenience Name: Anaerobic culture ?Radiology ?X-ray, foot left complete view - L third toe diabetic foot ulcer, r/o osteomyelitis  CPT 73630 - (ICD10 E11.8 - Type 2 diabetes mellitus with unspecified ?complications) ?Patient Medications ?llergies: No Known Allergies ?A ?Notifications Medication Indication Start End ?prior to debridement 11/01/2021 ?lidocaine

## 2021-11-08 ENCOUNTER — Encounter (HOSPITAL_BASED_OUTPATIENT_CLINIC_OR_DEPARTMENT_OTHER): Payer: Medicare Other | Admitting: General Surgery

## 2021-11-08 DIAGNOSIS — E11621 Type 2 diabetes mellitus with foot ulcer: Secondary | ICD-10-CM | POA: Diagnosis not present

## 2021-11-09 ENCOUNTER — Other Ambulatory Visit: Payer: Self-pay | Admitting: Physician Assistant

## 2021-11-09 DIAGNOSIS — I152 Hypertension secondary to endocrine disorders: Secondary | ICD-10-CM

## 2021-11-13 NOTE — Progress Notes (Signed)
[name], Fallon D. ([id number]) ?Visit Report for 11/08/2021 ?Arrival Information Details ?Patient Name: Date of Service: ?Robert [name], [name] 11/08/2021 12:30 PM ?Medical Record Number: [id number] ?Patient Account Number: [id number] ?Date of Birth/Sex: Treating RN: ?1955-12-26 (66 y.o. Marlan Palau ?Primary Care Walker Paddack: PRA CTICE, PLEA SA NT Other Clinician: ?Referring Faisal Stradling: ?Treating Trystin Hargrove/Extender: Duanne Guess ?PRA CTICE, PLEA SA NT ?Weeks in Treatment: 1 ?Visit Information History Since Last Visit ?Added or deleted any medications: No ?Patient Arrived: Gilmer Mor ?Any new allergies or adverse reactions: No ?Arrival Time: 12:40 ?Had a fall or experienced change in No ?Accompanied By: self ?activities of daily living that may affect ?Transfer Assistance: None ?risk of falls: ?Patient Identification Verified: Yes ?Signs or symptoms of abuse/neglect since last visito No ?Secondary Verification Process Completed: Yes ?Hospitalized since last visit: No ?Patient Requires Transmission-Based Precautions: No ?Implantable device outside of the clinic excluding No ?Patient Has Alerts: No ?cellular tissue based products placed in the center ?since last visit: ?Has Dressing in Place as Prescribed: Yes ?Pain Present Now: Yes ?Electronic Signature(s) ?Signed: 11/13/2021 5:16:31 PM By: Samuella Bruin ?Entered By: Samuella Bruin on 11/08/2021 12:42:14 ?-------------------------------------------------------------------------------- ?Encounter Discharge Information Details ?Patient Name: Date of Service: ?Robert [name], [name] 11/08/2021 12:30 PM ?Medical Record Number: [id number] ?Patient Account Number: [id number] ?Date of Birth/Sex: Treating RN: ?[birth year] (66 y.o. Marlan Palau ?Primary Care Moya Duan: PRA CTICE, PLEA SA NT Other Clinician: ?Referring Ronne Savoia: ?Treating Clovia Reine/Extender: Duanne Guess ?PRA CTICE, PLEA SA NT ?Weeks in Treatment: 1 ?Encounter Discharge Information Items Post Procedure  Vitals ?Discharge Condition: Stable ?Temperature (F): 98.3 ?Ambulatory Status: Gilmer Mor ?Pulse (bpm): 73 ?Discharge Destination: Home ?Respiratory Rate (breaths/min): 18 ?Transportation: Private Auto ?Blood Pressure (mmHg): 111/72 ?Accompanied By: self ?Schedule Follow-up Appointment: Yes ?Clinical Summary of Care: Patient Declined ?Electronic Signature(s) ?Signed: 11/13/2021 5:16:31 PM By: Samuella Bruin ?Entered By: Samuella Bruin on 11/08/2021 13:18:13 ?-------------------------------------------------------------------------------- ?Lower Extremity Assessment Details ?Patient Name: ?Date of Service: ?Robert [name], [name] 11/08/2021 12:30 PM ?Medical Record Number: [id number] ?Patient Account Number: [id number] ?Date of Birth/Sex: ?Treating RN: ?08-16-1955 (66 y.o. Marlan Palau ?Primary Care Radford Pease: PRA CTICE, PLEA SA NT ?Other Clinician: ?Referring Jarion Hawthorne: ?Treating Cathi Hazan/Extender: Duanne Guess ?PRA CTICE, PLEA SA NT ?Weeks in Treatment: 1 ?Edema Assessment ?Assessed: [Left: No] [Right: No] ?E[Left: dema] [Right: :] ?Calf ?Left: Right: ?Point of Measurement: From Medial Instep 49.2 cm ?Ankle ?Left: Right: ?Point of Measurement: From Medial Instep 29.1 cm ?Electronic Signature(s) ?Signed: 11/13/2021 5:16:31 PM By: Samuella Bruin ?Entered By: Samuella Bruin on 11/08/2021 12:48:41 ?-------------------------------------------------------------------------------- ?Multi Wound Chart Details ?Patient Name: ?Date of Service: ?Robert [name], [name] 11/08/2021 12:30 PM ?Medical Record Number: [id number] ?Patient Account Number: [id number] ?Date of Birth/Sex: ?Treating RN: ?1956-02-07 (66 y.o. [name] ?Primary Care Deep Bonawitz: PRA CTICE, PLEA SA NT ?Other Clinician: ?Referring Zlata Alcaide: ?Treating Nyema Hachey/Extender: Duanne Guess ?PRA CTICE, PLEA SA NT ?Weeks in Treatment: 1 ?Vital Signs ?Height(in): 72 ?Pulse(bpm): 73 ?Weight(lbs): 327 ?Blood Pressure(mmHg): 111/72 ?Body Mass Index(BMI):  44.3 ?Temperature(??F): 98.3 ?Respiratory Rate(breaths/min): 18 ?Photos: [N/A:N/A] ?Left, Plantar T Third ?oe N/A N/A ?Wound Location: ?Gradually Appeared N/A N/A ?Wounding Event: ?Diabetic Wound/Ulcer of the Lower N/A N/A ?Primary Etiology: ?Extremity ?Chronic sinus problems/congestion, N/A N/A ?Comorbid History: ?Lymphedema, Asthma, Sleep Apnea, ?Hepatitis B, Hepatitis C, Type II ?Diabetes, Gout, Osteoarthritis, ?Osteomyelitis, Neuropathy ?02/13/2021 N/A N/A ?Date Acquired: ?1 N/A N/A ?Weeks of Treatment: ?Open N/A N/A ?Wound Status: ?No N/A N/A ?Wound Recurrence: ?0.6x0.7x0.2 N/A N/A ?Measurements L x W x D (cm) ?0.33 N/A N/A ?A (cm?) : ?  rea ?0.066 N/A N/A ?Volume (cm?) : ?25.00% N/A N/A ?% Reduction in A rea: ?25.00% N/A N/A ?% Reduction in Volume: ?Grade 2 N/A N/A ?Classification: ?Medium N/A N/A ?Exudate A mount: ?Serosanguineous N/A N/A ?Exudate Type: ?red, brown N/A N/A ?Exudate Color: ?Epibole N/A N/A ?Wound Margin: ?Large (67-100%) N/A N/A ?Granulation A mount: ?Red N/A N/A ?Granulation Quality: ?None Present (0%) N/A N/A ?Necrotic A mount: ?Fat Layer (Subcutaneous Tissue): Yes N/A N/A ?Exposed Structures: ?Fascia: No ?Tendon: No ?Muscle: No ?Joint: No ?Bone: No ?None N/A N/A ?Epithelialization: ?Debridement - Selective/Open Wound N/A N/A ?Debridement: ?Pre-procedure Verification/Time Out 13:04 N/A N/A ?Taken: ?Lidocaine 4% T opical Solution N/A N/A ?Pain Control: ?Callus, Slough N/A N/A ?Tissue Debrided: ?Non-Viable Tissue N/A N/A ?Level: ?0.42 N/A N/A ?Debridement A (sq cm): ?rea ?Curette N/A N/A ?Instrument: ?Minimum N/A N/A ?Bleeding: ?Pressure N/A N/A ?Hemostasis A chieved: ?0 N/A N/A ?Procedural Pain: ?0 N/A N/A ?Post Procedural Pain: ?Procedure was tolerated well N/A N/A ?Debridement Treatment Response: ?0.6x0.7x0.2 N/A N/A ?Post Debridement Measurements L x ?W x D (cm) ?0.066 N/A N/A ?Post Debridement Volume: (cm?) ?Debridement N/A N/A ?Procedures Performed: ?Treatment Notes ?Wound #1 (Toe Third)  Wound Laterality: Plantar, Left ?Cleanser ?Wound Cleanser ?Discharge Instruction: Cleanse the wound with wound cleanser prior to applying a clean dressing using gauze sponges, not tissue or cotton balls. ?Peri-Wound Care ?Topical ?Primary Dressing ?Promogran Prisma Matrix, 4.34 (sq in) (silver collagen) ?Discharge Instruction: Moisten collagen with saline or hydrogel ?Secondary Dressing ?Optifoam Non-Adhesive Dressing, 4x4 in ?Discharge Instruction: Apply over primary dressing cut to make foam doughnut ?Woven Gauze Sponges 2x2 in ?Discharge Instruction: Apply over primary dressing as directed. ?Secured With ?Conforming Stretch Gauze Bandage, Sterile 2x75 (in/in) ?Discharge Instruction: Secure with stretch gauze as directed. ?Compression Wrap ?Compression Stockings ?Add-Ons ?Electronic Signature(s) ?Signed: 11/08/2021 1:23:40 PM By: Duanne Guess MD FACS ?Signed: 11/08/2021 4:48:49 PM By: Zenaida Deed RN, BSN ?Signed: 11/08/2021 4:48:49 PM By: Zenaida Deed RN, BSN ?Entered By: Duanne Guess on 11/08/2021 13:23:40 ?-------------------------------------------------------------------------------- ?Multi-Disciplinary Care Plan Details ?Patient Name: ?Date of Service: ?Robert [name], [name] 11/08/2021 12:30 PM ?Medical Record Number: [id number] ?Patient Account Number: [id number] ?Date of Birth/Sex: ?Treating RN: ?1956/05/12 (65 y.o. Marlan Palau ?Primary Care Benjamyn Hestand: PRA CTICE, PLEA SA NT ?Other Clinician: ?Referring Rustin Erhart: ?Treating Lekeya Rollings/Extender: Duanne Guess ?PRA CTICE, PLEA SA NT ?Weeks in Treatment: 1 ?Active Inactive ?Abuse / Safety / Falls / Self Care Management ?Nursing Diagnoses: ?History of Falls ?Potential for falls ?Potential for injury related to falls ?Goals: ?Patient/caregiver will verbalize/demonstrate measures taken to prevent injury and/or falls ?Date Initiated: 11/01/2021 ?Target Resolution Date: 11/22/2021 ?Goal Status: Active ?Interventions: ?Assess fall risk on admission and as  needed ?Notes: ?Nutrition ?Nursing Diagnoses: ?Impaired glucose control: actual or potential ?Potential for alteratiion in Nutrition/Potential for imbalanced nutrition ?Goals: ?Patient/caregiver will maintain therapeutic gl

## 2021-11-13 NOTE — Progress Notes (Signed)
[name], Ikechukwu D. ([id number]) ?Visit Report for 11/08/2021 ?Chief Complaint Document Details ?Patient Name: Date of Service: ?Robert [name], [name] 11/08/2021 12:30 PM ?Medical Record Number: [id number] ?Patient Account Number: [id number] ?Date of Birth/Sex: Treating RN: ?12-12-1955 (66 y.o. Robert [name] ?Primary Care Provider: PRA CTICE, PLEA SA NT Other Clinician: ?Referring Provider: ?Treating Provider/Extender: Fredirick Maudlin ?PRA CTICE, PLEA SA NT ?Weeks in Treatment: 1 ?Information Obtained from: Patient ?Chief Complaint ?Patients presents for treatment of an open diabetic ulcer ?Electronic Signature(s) ?Signed: 11/08/2021 1:24:05 PM By: Fredirick Maudlin MD FACS ?Entered By: Fredirick Maudlin on 11/08/2021 13:24:05 ?-------------------------------------------------------------------------------- ?Debridement Details ?Patient Name: Date of Service: ?Robert [name], [name] 11/08/2021 12:30 PM ?Medical Record Number: [id number] ?Patient Account Number: [id number] ?Date of Birth/Sex: Treating RN: ?1955/11/08 (66 y.o. Robert [name] ?Primary Care Provider: PRA CTICE, PLEA SA NT Other Clinician: ?Referring Provider: ?Treating Provider/Extender: Fredirick Maudlin ?PRA CTICE, PLEA SA NT ?Weeks in Treatment: 1 ?Debridement Performed for Assessment: Wound #1 Left,Plantar T Third ?oe ?Performed By: Physician Fredirick Maudlin, MD ?Debridement Type: Debridement ?Severity of Tissue Pre Debridement: Fat layer exposed ?Level of Consciousness (Pre-procedure): Awake and Alert ?Pre-procedure Verification/Time Out Yes - 13:04 ?Taken: ?Start Time: 13:04 ?Pain Control: Lidocaine 4% T opical Solution ?T Area Debrided (L x W): ?otal 0.6 (cm) x 0.7 (cm) = 0.42 (cm?) ?Tissue and other material debrided: Non-Viable, Callus, Slough, [place] ?Level: Non-Viable Tissue ?Debridement Description: Selective/Open Wound ?Instrument: Curette ?Bleeding: Minimum ?Hemostasis Achieved: Pressure ?Procedural Pain: 0 ?Post Procedural Pain: 0 ?Response to  Treatment: Procedure was tolerated well ?Level of Consciousness (Post- Awake and Alert ?procedure): ?Post Debridement Measurements of Total Wound ?Length: (cm) 0.6 ?Width: (cm) 0.7 ?Depth: (cm) 0.2 ?Volume: (cm?) 0.066 ?Character of Wound/Ulcer Post Debridement: Improved ?Severity of Tissue Post Debridement: Fat layer exposed ?Post Procedure Diagnosis ?Same as Pre-procedure ?Electronic Signature(s) ?Signed: 11/08/2021 5:05:56 PM By: Fredirick Maudlin MD FACS ?Signed: 11/13/2021 5:16:31 PM By: Adline Peals ?Entered By: Adline Peals on 11/08/2021 13:06:50 ?-------------------------------------------------------------------------------- ?HPI Details ?Patient Name: Date of Service: ?Robert [name], [name] 11/08/2021 12:30 PM ?Medical Record Number: [id number] ?Patient Account Number: [id number] ?Date of Birth/Sex: Treating RN: ?[birth year] (66 y.o. Robert [name] ?Primary Care Provider: PRA CTICE, PLEA SA NT Other Clinician: ?Referring Provider: ?Treating Provider/Extender: Fredirick Maudlin ?PRA CTICE, PLEA SA NT ?Weeks in Treatment: 1 ?History of Present Illness ?HPI Description: ADMISSION ?11/01/2021 ?This is a 66 year old man with a past medical history significant for type 2 diabetes mellitus, morbid obesity, hypertension, and venous insufficiency. 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 In August 2022, he developed an ulcer on his left third toe. His primary ?care provider has had him on several rounds of oral doxycycline. He is currently taking that now. He has been treating it at home with Betadine and hydrogen ?peroxide, as well as topical mupirocin ointment, also provided by his primary care provider. The most recent A1c that I have available to review was from ?September 2022 and was 6.6. ABI in clinic today was normal at 1.13. ?On the plantar surface of his left third toe, there is a well-circumscribed ulcer with periwound callus. The surface is a  little bit dry but otherwise clean. The toe ?itself is fairly red and swollen. ?11/08/2021: An x-ray performed last week was negative for osteomyelitis. He did have a PCR culture that came back with 2 different species, both sensitive to ?Augmentin. Augmentin was duly prescribed. His wound is clean and there is minimal periwound  callus. Light thin slough on the surface. We have been using ?Prisma silver collagen. The toe remains red and swollen and quite painful. ?Electronic Signature(s) ?Signed: 11/08/2021 1:25:27 PM By: Fredirick Maudlin MD FACS ?Entered By: Fredirick Maudlin on 11/08/2021 13:25:27 ?-------------------------------------------------------------------------------- ?Physical Exam Details ?Patient Name: Date of Service: ?Robert [name], [name] 11/08/2021 12:30 PM ?Medical Record Number: [id number] ?Patient Account Number: [id number] ?Date of Birth/Sex: Treating RN: ?1955-10-14 (66 y.o. Robert [name] ?Primary Care Provider: PRA CTICE, PLEA SA NT Other Clinician: ?Referring Provider: ?Treating Provider/Extender: Fredirick Maudlin ?PRA CTICE, PLEA SA NT ?Weeks in Treatment: 1 ?Constitutional ?. . . . No acute distress. ?Respiratory ?Normal work of breathing on room air. ?Notes ?11/08/2021: The plantar left third toe ulcer is about the same size with a moist surface and thin layer of slough. There is minimal periwound callus. The toe is ?quite red and inflamed however. ?Electronic Signature(s) ?Signed: 11/08/2021 1:27:09 PM By: Fredirick Maudlin MD FACS ?Entered By: Fredirick Maudlin on 11/08/2021 13:27:09 ?-------------------------------------------------------------------------------- ?Physician Orders Details ?Patient Name: ?Date of Service: ?Robert [name], [name] 11/08/2021 12:30 PM ?Medical Record Number: [id number] ?Patient Account Number: [id number] ?Date of Birth/Sex: ?Treating RN: ?Dec 13, 1955 (66 y.o. Robert [name] ?Primary Care Provider: PRA CTICE, PLEA SA NT ?Other Clinician: ?Referring Provider: ?Treating  Provider/Extender: Fredirick Maudlin ?PRA CTICE, PLEA SA NT ?Weeks in Treatment: 1 ?Verbal / Phone Orders: No ?Diagnosis Coding ?ICD-10 Coding ?Code Description ?[id number] Non-pressure chronic ulcer of other part of left foot with fat layer exposed ?E11.621 Type 2 diabetes mellitus with foot ulcer ?I87.2 Venous insufficiency (chronic) (peripheral) ?E66.01 Morbid (severe) obesity due to excess calories ?E11.8 Type 2 diabetes mellitus with unspecified complications ?I10 Essential (primary) hypertension ?Follow-up Appointments ?ppointment in 1 week. - Dr. Celine Ahr Room 1 5/3 @ 2:45 ?Return A ?Bathing/ Shower/ Hygiene ?May shower and wash wound with soap and water. ?Edema Control - Lymphedema / SCD / Other ?Elevate legs to the level of the heart or above for 30 minutes daily and/or when sitting, a frequency of: ?Avoid standing for long periods of time. ?Patient to wear own compression stockings every day. ?Exercise regularly ?Off-Loading ?Wedge shoe to: - front off-loading shoe to left foot when ambulating ?Wound Treatment ?Wound #1 - T Third ?oe Wound Laterality: Plantar, Left ?Cleanser: Wound Cleanser Every Other Day/30 Days ?Discharge Instructions: Cleanse the wound with wound cleanser prior to applying a clean dressing using gauze sponges, not tissue or cotton balls. ?Prim Dressing: Promogran Prisma Matrix, 4.34 (sq in) (silver collagen) Every Other Day/30 Days ?ary ?Discharge Instructions: Moisten collagen with saline or hydrogel ?Secondary Dressing: Optifoam Non-Adhesive Dressing, 4x4 in Every Other Day/30 Days ?Discharge Instructions: Apply over primary dressing cut to make foam doughnut ?Secondary Dressing: Woven Gauze Sponges 2x2 in Every Other Day/30 Days ?Discharge Instructions: Apply over primary dressing as directed. ?Secured With: Child psychotherapist, Sterile 2x75 (in/in) Every Other Day/30 Days ?Discharge Instructions: Secure with stretch gauze as directed. ?Electronic Signature(s) ?Signed:  11/08/2021 5:05:56 PM By: Fredirick Maudlin MD FACS ?Entered By: Fredirick Maudlin on 11/08/2021 13:27:31 ?-------------------------------------------------------------------------------- ?Problem List Details ?Patient

## 2021-11-15 ENCOUNTER — Encounter (HOSPITAL_BASED_OUTPATIENT_CLINIC_OR_DEPARTMENT_OTHER): Payer: Medicare Other | Attending: General Surgery | Admitting: General Surgery

## 2021-11-15 DIAGNOSIS — I872 Venous insufficiency (chronic) (peripheral): Secondary | ICD-10-CM | POA: Insufficient documentation

## 2021-11-15 DIAGNOSIS — Z6841 Body Mass Index (BMI) 40.0 and over, adult: Secondary | ICD-10-CM | POA: Diagnosis not present

## 2021-11-15 DIAGNOSIS — Z89422 Acquired absence of other left toe(s): Secondary | ICD-10-CM | POA: Insufficient documentation

## 2021-11-15 DIAGNOSIS — L97522 Non-pressure chronic ulcer of other part of left foot with fat layer exposed: Secondary | ICD-10-CM | POA: Diagnosis present

## 2021-11-15 DIAGNOSIS — Z792 Long term (current) use of antibiotics: Secondary | ICD-10-CM | POA: Diagnosis not present

## 2021-11-15 DIAGNOSIS — Z7901 Long term (current) use of anticoagulants: Secondary | ICD-10-CM | POA: Insufficient documentation

## 2021-11-15 DIAGNOSIS — I10 Essential (primary) hypertension: Secondary | ICD-10-CM | POA: Diagnosis not present

## 2021-11-15 DIAGNOSIS — E11621 Type 2 diabetes mellitus with foot ulcer: Secondary | ICD-10-CM | POA: Diagnosis not present

## 2021-11-15 NOTE — Progress Notes (Signed)
Robert, Marques D. ([id number]) ?Visit Report for 11/15/2021 ?Chief Complaint Document Details ?Patient Name: Date of Service: ?[name], [name] 11/15/2021 2:45 PM ?Medical Record Number: [id number] ?Patient Account Number: [id number] ?Date of Birth/Sex: Treating RN: ?06-28-1956 (66 y.o. [name] ?Primary Care Provider: PRA CTICE, PLEA SA NT Other Clinician: ?Referring Provider: ?Treating Provider/Extender: Fredirick Maudlin ?PRA CTICE, PLEA SA NT ?Weeks in Treatment: 2 ?Information Obtained from: Patient ?Chief Complaint ?Patients presents for treatment of an open diabetic ulcer ?Electronic Signature(s) ?Signed: 11/15/2021 3:51:55 PM By: Fredirick Maudlin MD FACS ?Entered By: Fredirick Maudlin on 11/15/2021 15:51:55 ?-------------------------------------------------------------------------------- ?Debridement Details ?Patient Name: Date of Service: ?Robert, [name] 11/15/2021 2:45 PM ?Medical Record Number: [id number] ?Patient Account Number: [id number] ?Date of Birth/Sex: Treating RN: ?1955/11/01 (66 y.o. [name] ?Primary Care Provider: PRA CTICE, PLEA SA NT Other Clinician: ?Referring Provider: ?Treating Provider/Extender: Fredirick Maudlin ?PRA CTICE, PLEA SA NT ?Weeks in Treatment: 2 ?Debridement Performed for Assessment: Wound #1 Left,Plantar T Third ?oe ?Performed By: Physician Fredirick Maudlin, MD ?Debridement Type: Debridement ?Severity of Tissue Pre Debridement: Fat layer exposed ?Level of Consciousness (Pre-procedure): Awake and Alert ?Pre-procedure Verification/Time Out Yes - 15:20 ?Taken: ?Start Time: 15:21 ?Pain Control: Lidocaine 4% Topical Solution ?T Area Debrided (L x W): ?otal 1 (cm) x 1 (cm) = 1 (cm?) ?Tissue and other material debrided: ?Non-Viable, Callus, Slough, Subcutaneous, Skin: Epidermis, Slough ?Level: Skin/Subcutaneous Tissue ?Debridement Description: Excisional ?Instrument: Curette ?Bleeding: Minimum ?Hemostasis Achieved: Pressure ?Procedural Pain: 0 ?Post Procedural Pain:  0 ?Response to Treatment: Procedure was tolerated well ?Level of Consciousness (Post- Awake and Alert ?procedure): ?Post Debridement Measurements of Total Wound ?Length: (cm) 0.5 ?Width: (cm) 0.4 ?Depth: (cm) 0.3 ?Volume: (cm?) 0.047 ?Character of Wound/Ulcer Post Debridement: Improved ?Severity of Tissue Post Debridement: Fat layer exposed ?Post Procedure Diagnosis ?Same as Pre-procedure ?Electronic Signature(s) ?Signed: 11/15/2021 5:10:10 PM By: Fredirick Maudlin MD FACS ?Signed: 11/15/2021 5:38:18 PM By: Baruch Gouty RN, BSN ?Entered By: Baruch Gouty on 11/15/2021 15:26:30 ?-------------------------------------------------------------------------------- ?HPI Details ?Patient Name: Date of Service: ?Robert, [name] 11/15/2021 2:45 PM ?Medical Record Number: [id number] ?Patient Account Number: [id number] ?Date of Birth/Sex: Treating RN: ?1956/03/11 (66 y.o. [name] ?Primary Care Provider: PRA CTICE, PLEA SA NT Other Clinician: ?Referring Provider: ?Treating Provider/Extender: Fredirick Maudlin ?PRA CTICE, PLEA SA NT ?Weeks in Treatment: 2 ?History of Present Illness ?HPI Description: ADMISSION ?11/01/2021 ?This is a 66 year old man with a past medical history significant for type 2 diabetes mellitus, morbid obesity, hypertension, and venous insufficiency. 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 In August 2022, he developed an ulcer on his left third toe. His primary ?care provider has had him on several rounds of oral doxycycline. He is currently taking that now. He has been treating it at home with Betadine and hydrogen ?peroxide, as well as topical mupirocin ointment, also provided by his primary care provider. The most recent A1c that I have available to review was from ?September 2022 and was 6.6. ABI in clinic today was normal at 1.13. ?On the plantar surface of his left third toe, there is a well-circumscribed ulcer with periwound callus. The  surface is a little bit dry but otherwise clean. The toe ?itself is fairly red and swollen. ?11/08/2021: An x-ray performed last week was negative for osteomyelitis. He did have a PCR culture that came back with 2 different species, both sensitive to ?Augmentin. Augmentin was duly prescribed. His wound is clean and there  is minimal periwound callus. Light thin slough on the surface. We have been using ?Prisma silver collagen. The toe remains red and swollen and quite painful. ?11/15/2021: He has been tolerating the Augmentin. His toe is much less red and swollen and the pain is markedly decreased. The wound bed is clean with good ?granulation tissue. He has accumulated periwound callus once again. ?Electronic Signature(s) ?Signed: 11/15/2021 3:55:14 PM By: Fredirick Maudlin MD FACS ?Entered By: Fredirick Maudlin on 11/15/2021 15:55:14 ?-------------------------------------------------------------------------------- ?Physical Exam Details ?Patient Name: Date of Service: ?Robert, [name] 11/15/2021 2:45 PM ?Medical Record Number: [id number] ?Patient Account Number: [id number] ?Date of Birth/Sex: Treating RN: ?1956-05-12 (66 y.o. [name] ?Primary Care Provider: PRA CTICE, PLEA SA NT Other Clinician: ?Referring Provider: ?Treating Provider/Extender: Fredirick Maudlin ?PRA CTICE, PLEA SA NT ?Weeks in Treatment: 2 ?Constitutional ?. . . . No acute distress. ?Respiratory ?Normal work of breathing on room air. ?Notes ?11/15/2021: The left third toe ulcer has a nice bed of healthy granulation tissue without much accumulation of slough. The periwound callus has built up again. The ?toe itself is markedly improved with less redness, swelling, and pain. There is some undermining at the dorsal portion of the wound. ?Electronic Signature(s) ?Signed: 11/15/2021 3:56:32 PM By: Fredirick Maudlin MD FACS ?Entered By: Fredirick Maudlin on 11/15/2021  15:56:32 ?-------------------------------------------------------------------------------- ?Physician Orders Details ?Patient Name: Date of Service: ?Robert, [name] 11/15/2021 2:45 PM ?Medical Record Number: [id number] ?Patient Account Number: [id number] ?Date of Birth/Sex: Treating RN: ?1956-02-19 (66 y.o. [name] ?Primary Care Provider: PRA CTICE, PLEA SA NT Other Clinician: ?Referring Provider: ?Treating Provider/Extender: Fredirick Maudlin ?PRA CTICE, PLEA SA NT ?Weeks in Treatment: 2 ?Verbal / Phone Orders: No ?Diagnosis Coding ?ICD-10 Coding ?Code Description ?[id number] Non-pressure chronic ulcer of other part of left foot with fat layer exposed ?E11.621 Type 2 diabetes mellitus with foot ulcer ?I87.2 Venous insufficiency (chronic) (peripheral) ?E66.01 Morbid (severe) obesity due to excess calories ?E11.8 Type 2 diabetes mellitus with unspecified complications ?I10 Essential (primary) hypertension ?Follow-up Appointments ?ppointment in 1 week. - Dr. Celine Ahr Room 1 ?Return A ?5/10 @ 3:30 pm ?Bathing/ Shower/ Hygiene ?May shower and wash wound with soap and water. ?Edema Control - Lymphedema / SCD / Other ?Elevate legs to the level of the heart or above for 30 minutes daily and/or when sitting, a frequency of: ?Avoid standing for long periods of time. ?Patient to wear own compression stockings every day. ?Exercise regularly ?Off-Loading ?Wedge shoe to: - front off-loading shoe to left foot when ambulating ?Wound Treatment ?Wound #1 - T Third ?oe Wound Laterality: Plantar, Left ?Cleanser: Wound Cleanser Every Other Day/30 Days ?Discharge Instructions: Cleanse the wound with wound cleanser prior to applying a clean dressing using gauze sponges, not tissue or cotton balls. ?Prim Dressing: Promogran Prisma Matrix, 4.34 (sq in) (silver collagen) Every Other Day/30 Days ?ary ?Discharge Instructions: Moisten collagen with saline or hydrogel ?Secondary Dressing: Optifoam Non-Adhesive Dressing, 4x4 in Every Other  Day/30 Days ?Discharge Instructions: Apply over primary dressing cut to make foam doughnut ?Secondary Dressing: Woven Gauze Sponges 2x2 in Every Other Day/30 Days ?Discharge Instructions: Apply over primary dressing as directed. ?Secured With: Child psychotherapist, Sterile 2x75 (in/in) Every

## 2021-11-16 NOTE — Progress Notes (Signed)
[name], Normon D. ([id number]) ?Visit Report for 11/15/2021 ?Arrival Information Details ?Patient Name: Date of Service: ?Robert [name], [name] 11/15/2021 2:45 PM ?Medical Record Number: [id number] ?Patient Account Number: [id number] ?Date of Birth/Sex: Treating RN: ?1956-06-18 (66 y.o. [name] ?Primary Care Pam Vanalstine: PRA CTICE, PLEA SA NT Other Clinician: ?Referring Brok Stocking: ?Treating Shawn Dannenberg/Extender: Fredirick Maudlin ?PRA CTICE, PLEA SA NT ?Weeks in Treatment: 2 ?Visit Information History Since Last Visit ?Added or deleted any medications: No ?Patient Arrived: Kasandra Knudsen ?Any new allergies or adverse reactions: No ?Arrival Time: 14:59 ?Had a fall or experienced change in No ?Accompanied By: self ?activities of daily living that may affect ?Transfer Assistance: None ?risk of falls: ?Patient Identification Verified: Yes ?Signs or symptoms of abuse/neglect since last visito No ?Secondary Verification Process Completed: Yes ?Hospitalized since last visit: No ?Patient Requires Transmission-Based Precautions: No ?Implantable device outside of the clinic excluding No ?Patient Has Alerts: No ?cellular tissue based products placed in the center ?since last visit: ?Has Dressing in Place as Prescribed: Yes ?Pain Present Now: Yes ?Electronic Signature(s) ?Signed: 11/16/2021 11:10:54 AM By: Sandre Kitty ?Entered By: Sandre Kitty on 11/15/2021 15:00:30 ?-------------------------------------------------------------------------------- ?Encounter Discharge Information Details ?Patient Name: Date of Service: ?Robert [name], [name] 11/15/2021 2:45 PM ?Medical Record Number: [id number] ?Patient Account Number: [id number] ?Date of Birth/Sex: Treating RN: ?12-Aug-1955 (66 y.o. [name] ?Primary Care May Ozment: PRA CTICE, PLEA SA NT Other Clinician: ?Referring Dawnya Grams: ?Treating Hargis Vandyne/Extender: Fredirick Maudlin ?PRA CTICE, PLEA SA NT ?Weeks in Treatment: 2 ?Encounter Discharge Information Items Post Procedure Vitals ?Discharge  Condition: Stable ?Temperature (F): 98.6 ?Ambulatory Status: Kasandra Knudsen ?Pulse (bpm): 65 ?Discharge Destination: Home ?Respiratory Rate (breaths/min): 18 ?Transportation: Private Auto ?Blood Pressure (mmHg): 123/74 ?Accompanied By: self ?Schedule Follow-up Appointment: Yes ?Clinical Summary of Care: Patient Declined ?Electronic Signature(s) ?Signed: 11/15/2021 5:38:18 PM By: Baruch Gouty RN, BSN ?Entered By: Baruch Gouty on 11/15/2021 15:37:40 ?-------------------------------------------------------------------------------- ?Lower Extremity Assessment Details ?Patient Name: ?Date of Service: ?Robert [name], [name] 11/15/2021 2:45 PM ?Medical Record Number: [id number] ?Patient Account Number: [id number] ?Date of Birth/Sex: ?Treating RN: ?1955-10-03 (66 y.o. [name] ?Primary Care Amanee Iacovelli: PRA CTICE, PLEA SA NT ?Other Clinician: ?Referring Deundra Bard: ?Treating Daelin Haste/Extender: Fredirick Maudlin ?PRA CTICE, PLEA SA NT ?Weeks in Treatment: 2 ?Edema Assessment ?Assessed: [Left: No] [Right: No] ?E[Left: dema] [Right: :] ?Calf ?Left: Right: ?Point of Measurement: From Medial Instep 49 cm ?Ankle ?Left: Right: ?Point of Measurement: From Medial Instep 29 cm ?Vascular Assessment ?Pulses: ?Dorsalis Pedis ?Palpable: [Left:Yes] ?Electronic Signature(s) ?Signed: 11/15/2021 5:40:12 PM By: Levan Hurst RN, BSN ?Entered By: Levan Hurst on 11/15/2021 15:08:05 ?-------------------------------------------------------------------------------- ?Multi Wound Chart Details ?Patient Name: ?Date of Service: ?Robert [name], [name] 11/15/2021 2:45 PM ?Medical Record Number: [id number] ?Patient Account Number: [id number] ?Date of Birth/Sex: ?Treating RN: ?Oct 15, 1955 (66 y.o. [name] ?Primary Care Rennae Ferraiolo: PRA CTICE, PLEA SA NT ?Other Clinician: ?Referring Myracle Febres: ?Treating Nikolis Berent/Extender: Fredirick Maudlin ?PRA CTICE, PLEA SA NT ?Weeks in Treatment: 2 ?Vital Signs ?Height(in): 72 ?Pulse(bpm): 65 ?Weight(lbs): 327 ?Blood Pressure(mmHg):  123/74 ?Body Mass Index(BMI): 44.3 ?Temperature(??F): 98.6 ?Respiratory Rate(breaths/min): 18 ?Photos: [1:Left, Plantar T Third oe] [N/A:N/A N/A] ?Wound Location: [1:Gradually Appeared] [N/A:N/A] ?Wounding Event: [1:Diabetic Wound/Ulcer of the Lower] [N/A:N/A] ?Primary Etiology: [1:Extremity Chronic sinus problems/congestion, N/A] ?Comorbid History: [1:Lymphedema, Asthma, Sleep Apnea, Hepatitis B, Hepatitis C, Type II Diabetes, Gout, Osteoarthritis, Osteomyelitis, Neuropathy 02/13/2021] [N/A:N/A] ?Date Acquired: [1:2] [N/A:N/A] ?Weeks of Treatment: [1:Open] [N/A:N/A] ?Wound Status: [1:No] [N/A:N/A] ?Wound Recurrence: [1:0.4x0.4x0.3] [N/A:N/A] ?Measurements L x W x D (cm) [1:0.126] [N/A:N/A] ?A (cm?) : ?  rea [1:0.038] [N/A:N/A] ?Volume (cm?) : [1:71.40%] [N/A:N/A] ?% Reduction in A [1:rea: 56.80%] [N/A:N/A] ?% Reduction in Volume: [1:12] ?Starting Position 1 (o'clock): [1:12] ?Ending Position 1 (o'clock): [1:0.2] ?Maximum Distance 1 (cm): [1:Yes] [N/A:N/A] ?Undermining: [1:Grade 2] [N/A:N/A] ?Classification: [1:Medium] [N/A:N/A] ?Exudate A mount: [1:Serosanguineous] [N/A:N/A] ?Exudate Type: [1:red, brown] [N/A:N/A] ?Exudate Color: [1:Well defined, not attached] [N/A:N/A] ?Wound Margin: [1:Large (67-100%)] [N/A:N/A] ?Granulation A mount: [1:Red, Pink] [N/A:N/A] ?Granulation Quality: [1:None Present (0%)] [N/A:N/A] ?Necrotic A mount: ?[1:Fat Layer (Subcutaneous Tissue): Yes N/A] ?Exposed Structures: ?[1:Fascia: No Tendon: No Muscle: No Joint: No Bone: No Small (1-33%)] [N/A:N/A] ?Epithelialization: [1:Debridement - Excisional] [N/A:N/A] ?Debridement: ?Pre-procedure Verification/Time Out 15:20 [N/A:N/A] ?Taken: [1:Lidocaine 4% Topical Solution] [N/A:N/A] ?Pain Control: [1:Callus, Subcutaneous, Slough] [N/A:N/A] ?Tissue Debrided: [1:Skin/Subcutaneous Tissue] [N/A:N/A] ?Level: [1:1] [N/A:N/A] ?Debridement A (sq cm): [1:rea Curette] [N/A:N/A] ?Instrument: [1:Minimum] [N/A:N/A] ?Bleeding: [1:Pressure] [N/A:N/A] ?Hemostasis A  chieved: [1:0] [N/A:N/A] ?Procedural Pain: [1:0] [N/A:N/A] ?Post Procedural Pain: [1:Procedure was tolerated well] [N/A:N/A] ?Debridement Treatment Response: [1:0.5x0.4x0.3] [N/A:N/A] ?Post Debridement Measurements L x ?W x D (cm) [1:0.047] [N/A:N/A] ?Post Debridement Volume: (cm?) [1:Debridement] [N/A:N/A] ?Treatment Notes ?Wound #1 (Toe Third) Wound Laterality: Plantar, Left ?Cleanser ?Wound Cleanser ?Discharge Instruction: Cleanse the wound with wound cleanser prior to applying a clean dressing using gauze sponges, not tissue or cotton balls. ?Peri-Wound Care ?Topical ?Primary Dressing ?Promogran Prisma Matrix, 4.34 (sq in) (silver collagen) ?Discharge Instruction: Moisten collagen with saline or hydrogel ?Secondary Dressing ?Optifoam Non-Adhesive Dressing, 4x4 in ?Discharge Instruction: Apply over primary dressing cut to make foam doughnut ?Woven Gauze Sponges 2x2 in ?Discharge Instruction: Apply over primary dressing as directed. ?Secured With ?Conforming Stretch Gauze Bandage, Sterile 2x75 (in/in) ?Discharge Instruction: Secure with stretch gauze as directed. ?Compression Wrap ?Compression Stockings ?Add-Ons ?Electronic Signature(s) ?Signed: 11/15/2021 3:51:34 PM By: Fredirick Maudlin MD FACS ?Signed: 11/15/2021 5:38:18 PM By: Baruch Gouty RN, BSN ?Entered By: Fredirick Maudlin on 11/15/2021 15:51:33 ?-------------------------------------------------------------------------------- ?Multi-Disciplinary Care Plan Details ?Patient Name: ?Date of Service: ?Robert [name], [name] 11/15/2021 2:45 PM ?Medical Record Number: [id number] ?Patient Account Number: [id number] ?Date of Birth/Sex: ?Treating RN: ?Jun 11, 1956 (66 y.o. [name] ?Primary Care Lynley Killilea: PRA CTICE, PLEA SA NT ?Other Clinician: ?Referring Gloriajean Okun: ?Treating Nawaal Alling/Extender: Fredirick Maudlin ?PRA CTICE, PLEA SA NT ?Weeks in Treatment: 2 ?Active Inactive ?Abuse / Safety / Falls / Self Care Management ?Nursing Diagnoses: ?History of Falls ?Potential for  falls ?Potential for injury related to falls ?Goals: ?Patient/caregiver will verbalize/demonstrate measures taken to prevent injury and/or falls ?Date Initiated: 11/01/2021 ?Target Resolution Date: 11/22/2021

## 2021-11-22 ENCOUNTER — Encounter (HOSPITAL_BASED_OUTPATIENT_CLINIC_OR_DEPARTMENT_OTHER): Payer: Medicare Other | Admitting: General Surgery

## 2021-11-22 DIAGNOSIS — E11621 Type 2 diabetes mellitus with foot ulcer: Secondary | ICD-10-CM | POA: Diagnosis not present

## 2021-11-22 NOTE — Progress Notes (Signed)
[name], [name] ([id number]) ?Visit Report for 11/22/2021 ?Chief Complaint Document Details ?Patient Name: Date of Service: ?[name], [name] 11/22/2021 3:30 PM ?Medical Record Number: [id number] ?Patient Account Number: [id number] ?Date of Birth/Sex: Treating RN: ?1955/08/12 (66 y.o. Robert [name] ?Primary Care Provider: PRA CTICE, PLEA SA NT Other Clinician: ?Referring Provider: ?Treating Provider/Extender: Duanne Guess ?PRA CTICE, PLEA SA NT ?Weeks in Treatment: 3 ?Information Obtained from: Patient ?Chief Complaint ?Patients presents for treatment of an open diabetic ulcer ?Electronic Signature(s) ?Signed: 11/22/2021 4:52:32 PM By: Duanne Guess MD FACS ?Entered By: Duanne Guess on 11/22/2021 16:52:32 ?-------------------------------------------------------------------------------- ?Debridement Details ?Patient Name: Date of Service: ?[name], [name] 11/22/2021 3:30 PM ?Medical Record Number: [id number] ?Patient Account Number: [id number] ?Date of Birth/Sex: Treating RN: ?05-27-1956 (66 y.o. Robert [name] ?Primary Care Provider: PRA CTICE, PLEA SA NT Other Clinician: ?Referring Provider: ?Treating Provider/Extender: Duanne Guess ?PRA CTICE, PLEA SA NT ?Weeks in Treatment: 3 ?Debridement Performed for Assessment: Wound #1 Left,Plantar T Third ?oe ?Performed By: Physician Duanne Guess, MD ?Debridement Type: Debridement ?Severity of Tissue Pre Debridement: Bone involvement without necrosis ?Level of Consciousness (Pre-procedure): Awake and Alert ?Pre-procedure Verification/Time Out Yes - 16:25 ?Taken: ?Start Time: 16:25 ?T Area Debrided (L x W): ?otal 1 (cm) x 1 (cm) = 1 (cm?) ?Tissue and other material debrided: ?Non-Viable, Callus, Slough, Subcutaneous, Skin: Epidermis, Slough ?Level: Skin/Subcutaneous Tissue ?Debridement Description: Excisional ?Instrument: Curette ?Bleeding: Minimum ?Hemostasis Achieved: Pressure ?Procedural Pain: 0 ?Post Procedural Pain: 0 ?Response to Treatment: Procedure  was tolerated well ?Level of Consciousness (Post- Awake and Alert ?procedure): ?Post Debridement Measurements of Total Wound ?Length: (cm) 0.4 ?Width: (cm) 0.4 ?Depth: (cm) 0.4 ?Volume: (cm?) 0.05 ?Character of Wound/Ulcer Post Debridement: Improved ?Severity of Tissue Post Debridement: Bone involvement without necrosis ?Post Procedure Diagnosis ?Same as Pre-procedure ?Electronic Signature(s) ?Signed: 11/22/2021 5:19:03 PM By: Duanne Guess MD FACS ?Signed: 11/22/2021 5:37:56 PM By: Zenaida Deed RN, BSN ?Entered By: Zenaida Deed on 11/22/2021 16:32:19 ?-------------------------------------------------------------------------------- ?Debridement Details ?Patient Name: ?Date of Service: ?[name], [name] 11/22/2021 3:30 PM ?Medical Record Number: [id number] ?Patient Account Number: [id number] ?Date of Birth/Sex: ?Treating RN: ?[birth year] (66 y.o. Robert [name] ?Primary Care Provider: PRA CTICE, PLEA SA NT ?Other Clinician: ?Referring Provider: ?Treating Provider/Extender: Duanne Guess ?PRA CTICE, PLEA SA NT ?Weeks in Treatment: 3 ?Debridement Performed for Assessment: Wound #2 Left,Distal,Plantar T Third ?oe ?Performed By: Physician Duanne Guess, MD ?Debridement Type: Debridement ?Severity of Tissue Pre Debridement: Fat layer exposed ?Level of Consciousness (Pre-procedure): Awake and Alert ?Pre-procedure Verification/Time Out Yes - 16:25 ?Taken: ?Start Time: 16:25 ?T Area Debrided (L x W): ?otal 0.5 (cm) x 0.3 (cm) = 0.15 (cm?) ?Tissue and other material debrided: ?Viable, Non-Viable, Slough, Subcutaneous, Skin: Epidermis, Slough ?Level: Skin/Subcutaneous Tissue ?Debridement Description: Excisional ?Instrument: Curette ?Bleeding: Minimum ?Hemostasis Achieved: Pressure ?Procedural Pain: 0 ?Post Procedural Pain: 0 ?Response to Treatment: Procedure was tolerated well ?Level of Consciousness (Post- Awake and Alert ?procedure): ?Post Debridement Measurements of Total Wound ?Length: (cm) 0.5 ?Width: (cm)  0.3 ?Depth: (cm) 0.1 ?Volume: (cm?) 0.012 ?Character of Wound/Ulcer Post Debridement: Improved ?Severity of Tissue Post Debridement: Fat layer exposed ?Post Procedure Diagnosis ?Same as Pre-procedure ?Electronic Signature(s) ?Signed: 11/22/2021 5:19:03 PM By: Duanne Guess MD FACS ?Signed: 11/22/2021 5:37:56 PM By: Zenaida Deed RN, BSN ?Entered By: Zenaida Deed on 11/22/2021 16:33:15 ?-------------------------------------------------------------------------------- ?HPI Details ?Patient Name: ?Date of Service: ?[name], [name] 11/22/2021 3:30 PM ?Medical Record Number: [id number] ?Patient Account Number: [id number] ?Date of Birth/Sex: ?Treating RN: ?[birth year] (66 y.o.  Robert [name] ?Primary Care Provider: PRA CTICE, PLEA SA NT Other Clinician: ?Referring Provider: ?Treating Provider/Extender: Duanne Guess ?PRA CTICE, PLEA SA NT ?Weeks in Treatment: 3 ?History of Present Illness ?HPI Description: ADMISSION ?11/01/2021 ?This is a 66 year old man with a past medical history significant for type 2 diabetes mellitus, morbid obesity, hypertension, and venous insufficiency. 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 In August 2022, he developed an ulcer on his left third toe. His primary ?care provider has had him on several rounds of oral doxycycline. He is currently taking that now. He has been treating it at home with Betadine and hydrogen ?peroxide, as well as topical mupirocin ointment, also provided by his primary care provider. The most recent A1c that I have available to review was from ?September 2022 and was 6.6. ABI in clinic today was normal at 1.13. ?On the plantar surface of his left third toe, there is a well-circumscribed ulcer with periwound callus. The surface is a little bit dry but otherwise clean. The toe ?itself is fairly red and swollen. ?11/08/2021: An x-ray performed last week was negative for osteomyelitis. He did have a PCR  culture that came back with 2 different species, both sensitive to ?Augmentin. Augmentin was duly prescribed. His wound is clean and there is minimal periwound callus. Light thin slough on the surface. We have been using ?Prisma silver collagen. The toe remains red and swollen and quite painful. ?11/15/2021: He has been tolerating the Augmentin. His toe is much less red and swollen and the pain is markedly decreased. The wound bed is clean with good ?granulation tissue. He has accumulated periwound callus once again. ?11/22/2021: Unfortunately, the wound on his distal third toe probes to bone on today's inspection. There is no increase in erythema nor any purulent drainage but ?this is of course concerning for development of osteomyelitis. He has also developed a new ulcer on the plantar base of the same toe. It looks like it is ?probably secondary to friction based upon the deformities of his toes and pressure points. ?Electronic Signature(s) ?Signed: 11/22/2021 4:53:40 PM By: Duanne Guess MD FACS ?Entered By: Duanne Guess on 11/22/2021 16:53:40 ?-------------------------------------------------------------------------------- ?Physical Exam Details ?Patient Name: Date of Service: ?YALE, GOLLA D. 11/22/2021 3:30 PM ?Medical Record Number: [id number] ?Patient Account Number: [id number] ?Date of Birth/Sex: Treating RN: ?[birth year] (66 y.o. Robert [name] ?Primary Care Provider: PRA CTICE, PLEA SA NT Other Clinician: ?Referring Provider: ?Treating Provider/Extender: Duanne Guess ?PRA CTICE, PLEA SA NT ?Weeks in Treatment: 3 ?Constitutional ?. . . . No acute distress. ?Respiratory ?Normal work of breathing on room air. ?Notes ?11/22/2021: Unfortunately, the wound on his distal third toe probes to bone on today's inspection. There is no increase in erythema nor any purulent drainage but ?this is of course concerning for development of osteomyelitis. He has also developed a new ulcer on the plantar base of the  same toe. It looks like it is ?probably secondary to friction based upon the deformities of his toes and pressure points. It appears to be limited to just the skin. ?Electronic Signature(s) ?Signed: 5/10/202

## 2021-11-23 ENCOUNTER — Other Ambulatory Visit (HOSPITAL_COMMUNITY): Payer: Self-pay | Admitting: General Surgery

## 2021-11-23 ENCOUNTER — Other Ambulatory Visit: Payer: Self-pay | Admitting: General Surgery

## 2021-11-23 DIAGNOSIS — E11621 Type 2 diabetes mellitus with foot ulcer: Secondary | ICD-10-CM

## 2021-11-27 ENCOUNTER — Ambulatory Visit: Payer: Medicare Other | Admitting: Podiatry

## 2021-11-27 ENCOUNTER — Ambulatory Visit (INDEPENDENT_AMBULATORY_CARE_PROVIDER_SITE_OTHER): Payer: Medicare Other

## 2021-11-27 ENCOUNTER — Encounter: Payer: Self-pay | Admitting: Podiatry

## 2021-11-27 DIAGNOSIS — M778 Other enthesopathies, not elsewhere classified: Secondary | ICD-10-CM

## 2021-11-27 DIAGNOSIS — S86311A Strain of muscle(s) and tendon(s) of peroneal muscle group at lower leg level, right leg, initial encounter: Secondary | ICD-10-CM

## 2021-11-27 NOTE — Progress Notes (Signed)
?  Subjective:  ?Patient ID: Robert [name], male    DOB: January 23, 1956,   MRN: [id number] ? ?Chief Complaint  ?Patient presents with  ? Foot Pain  ?  I hurt my right foot on Saturday going up steps and heard a crack and felt it and hurts on the side and does swell and I may have some arthritis and there is some burning and throbbing  ? ? ?66 y.o. male presents for concern of right foot pain. Relates on Saturday he injured the side of his right foot. Relates he felt a crack as he was walking up the stairs. Relates swelling and tenderness. He is diabetic and has a history of back surgery 12 years ago.  Was seen about 6 months ago for left toe ulcer and was lost to follow-up .Patient is now following-up with wound care for toe ulcers relates second healed and still working on third digit wound. Denies any other pedal complaints. Denies n/v/f/c.  ? ?Past Medical History:  ?Diagnosis Date  ? Allergic rhinitis due to pollen   ? Arthritis   ? Asthma   ? Back pain   ? Barrett esophagus   ? Depressive disorder   ? Diabetes mellitus type 2 in obese [facility])   ? Exposure to hepatitis B   ? Exposure to hepatitis C   ? Narcotic abuse (HCC)   ? pain medications  ? OSA (obstructive sleep apnea)   ? on CPAP   ? ? ?Objective:  ?Physical Exam: ?Vascular: DP/PT pulses 2/4 bilateral. CFT <3 seconds. Normal hair growth on digits. No edema.  ?Skin. No lacerations or abrasions bilateral feet. Left toe ulcers being followed by wound care ?Musculoskeletal: MMT 5/5 bilateral lower extremities in DF, PF, Inversion and Eversion. Deceased ROM in DF of ankle joint.  Tender along course of peroneal tendon distal to lateral malleolus. Tender to insertion of peroneal tendon. Pain with DF and eversion of the foot. No pain with PF or inversion.  ?Neurological: Sensation intact to light touch.  ? ?Assessment:  ? ?1. Peroneal tendon tear, right, initial encounter   ? ? ? ?Plan:  ?Patient was evaluated and treated and all questions answered. ?X-rays reviewed  and discussed with patient. No acute fractures or dislocations noted. Some spurring type changes on base of fifth metatarsal could possibly represent age indeterminate avulsion fracture. Second toe amputation. Significant degenerative changes and valgus rotation of hallux on right. Os peroneum noted  ?-Discussed treatement options for peroneal tendon injury vs possible avulsion fracture; risks, alternatives, and benefits explained.  ?-Dispensed CAM bot. Patient to wear at all times and instructed on use ?-Recommend protection, rest, ice, elevation daily until symptoms improve ?-Rx pain med/antinflammatories as needed ?-Patient to return to office in 4 weeks for serial x-rays to assess healing  or sooner if condition worsens. ? ? ?Patient is currently follow-up up with wound care for left to ulcers.  ? ?Louann Sjogren, DPM  ? ? ?

## 2021-11-28 ENCOUNTER — Other Ambulatory Visit: Payer: Self-pay | Admitting: Podiatry

## 2021-11-28 DIAGNOSIS — S86311A Strain of muscle(s) and tendon(s) of peroneal muscle group at lower leg level, right leg, initial encounter: Secondary | ICD-10-CM

## 2021-11-29 ENCOUNTER — Encounter (HOSPITAL_BASED_OUTPATIENT_CLINIC_OR_DEPARTMENT_OTHER): Payer: Medicare Other | Admitting: General Surgery

## 2021-11-29 DIAGNOSIS — E11621 Type 2 diabetes mellitus with foot ulcer: Secondary | ICD-10-CM | POA: Diagnosis not present

## 2021-11-30 NOTE — Progress Notes (Signed)
[name], [name] ([id number]) Visit Report for 11/29/2021 Arrival Information Details Patient Name: Date of Service: TEE, RICHESON 11/29/2021 1:15 PM Medical Record Number: [id number] Patient Account Number: [id number] Date of Birth/Sex: Treating RN: 02-Aug-1955 (66 y.o. Robert [name] Primary Care Carlon Chaloux: PRA CTICE, PLEA SA NT Other Clinician: Referring Rocket Gunderson: Treating Faylinn Schwenn/Extender: Duanne Guess PRA CTICE, PLEA SA NT Weeks in Treatment: 4 Visit Information History Since Last Visit Added or deleted any medications: No Patient Arrived: Cane Any new allergies or adverse reactions: No Arrival Time: 13:17 Had a fall or experienced change in No Accompanied By: self activities of daily living that may affect Transfer Assistance: None risk of falls: Patient Identification Verified: Yes Signs or symptoms of abuse/neglect since last visito No Secondary Verification Process Completed: Yes Hospitalized since last visit: No Patient Requires Transmission-Based Precautions: No Implantable device outside of the clinic excluding No Patient Has Alerts: No cellular tissue based products placed in the center since last visit: Has Dressing in Place as Prescribed: Yes Has Footwear/Offloading in Place as Prescribed: Yes Left: Wedge Shoe Pain Present Now: Yes Electronic Signature(s) Signed: 11/29/2021 6:28:42 PM By: Zenaida Deed RN, BSN Entered By: Zenaida Deed on 11/29/2021 13:18:15 -------------------------------------------------------------------------------- Encounter Discharge Information Details Patient Name: Date of Service: Robert Majestic D. 11/29/2021 1:15 PM Medical Record Number: [id number] Patient Account Number: [id number] Date of Birth/Sex: Treating RN: Jun 29, 1956 (65 y.o. Robert [name] Primary Care Calani Gick: PRA CTICE, PLEA SA NT Other Clinician: Referring Zevin Nevares: Treating [name]/Extender: Duanne Guess PRA CTICE, PLEA SA NT Weeks in Treatment:  4 Encounter Discharge Information Items Post Procedure Vitals Discharge Condition: Stable Temperature (F): 99.9 Ambulatory Status: Ambulatory Pulse (bpm): 66 Discharge Destination: Home Respiratory Rate (breaths/min): 18 Transportation: Private Auto Blood Pressure (mmHg): 135/78 Accompanied By: self Schedule Follow-up Appointment: Yes Clinical Summary of Care: Patient Declined Electronic Signature(s) Signed: 11/30/2021 12:43:38 PM By: Redmond Pulling RN, BSN Entered By: Redmond Pulling on 11/29/2021 14:22:14 -------------------------------------------------------------------------------- Lower Extremity Assessment Details Patient Name: Date of Service: Robert Majestic D. 11/29/2021 1:15 PM Medical Record Number: [id number] Patient Account Number: [id number] Date of Birth/Sex: Treating RN: 07/13/1956 (66 y.o. Robert [name] Primary Care Kejuan Bekker: PRA CTICE, PLEA SA NT Other Clinician: Referring Shaniya Tashiro: Treating Miniya Miguez/Extender: Duanne Guess PRA CTICE, PLEA SA NT Weeks in Treatment: 4 Edema Assessment Assessed: [Left: No] [Right: No] Edema: [Left: Ye] [Right: s] Calf Left: Right: Point of Measurement: From Medial Instep 49 cm Ankle Left: Right: Point of Measurement: From Medial Instep 29 cm Vascular Assessment Pulses: Dorsalis Pedis Palpable: [Left:Yes] Electronic Signature(s) Signed: 11/29/2021 6:28:42 PM By: Zenaida Deed RN, BSN Entered By: Zenaida Deed on 11/29/2021 13:21:53 -------------------------------------------------------------------------------- Multi Wound Chart Details Patient Name: Date of Service: Robert Majestic D. 11/29/2021 1:15 PM Medical Record Number: [id number] Patient Account Number: [id number] Date of Birth/Sex: Treating RN: 1955/08/15 (66 y.o. Robert [name] Primary Care [name]: PRA CTICE, PLEA SA NT Other Clinician: Referring Beverlie Kurihara: Treating Cobe Viney/Extender: Duanne Guess PRA CTICE, PLEA SA NT Weeks in  Treatment: 4 Vital Signs Height(in): 72 Pulse(bpm): 85 Weight(lbs): 327 Blood Pressure(mmHg): 108/68 Body Mass Index(BMI): 44.3 Temperature(F): 98.2 Respiratory Rate(breaths/min): 18 Photos: [1:Left, Plantar T Third oe] [2:Left, Distal, Plantar T Third] [N/A:N/A oe N/A] Wound Location: [1:Gradually Appeared] [2:Blister] [N/A:N/A] Wounding Event: [1:Diabetic Wound/Ulcer of the Lower] [2:Diabetic Wound/Ulcer of the Lower] [N/A:N/A] Primary Etiology: [1:Extremity Chronic sinus problems/congestion, Chronic sinus problems/congestion, N/A] [2:Extremity] Comorbid History: [1:Lymphedema, Asthma, Sleep Apnea, Lymphedema, Asthma, Sleep Apnea, Hepatitis B, Hepatitis C, Type II Diabetes, Gout,  Osteoarthritis, Osteomyelitis, Neuropathy 02/13/2021] [2:Hepatitis B, Hepatitis C, Type II Diabetes, Gout,  Osteoarthritis, Osteomyelitis, Neuropathy 11/22/2021] [N/A:N/A] Date Acquired: [1:4] [2:1] [N/A:N/A] Weeks of Treatment: [1:Open] [2:Open] [N/A:N/A] Wound Status: [1:No] [2:No] [N/A:N/A] Wound Recurrence: [1:0.5x0.6x0.4] [2:0.6x0.5x0.2] [N/A:N/A] Measurements L x W x D (cm) [1:0.236] [2:0.236] [N/A:N/A] A (cm) : rea [1:0.094] [2:0.047] [N/A:N/A] Volume (cm) : [1:46.40%] [2:-100.00%] [N/A:N/A] % Reduction in A [1:rea: -6.80%] [2:-95.80%] [N/A:N/A] % Reduction in Volume: [1:12] Starting Position 1 (o'clock): [1:12] Ending Position 1 (o'clock): [1:0.2] Maximum Distance 1 (cm): [1:Yes] [2:No] [N/A:N/A] Undermining: [1:Grade 2] [2:Grade 1] [N/A:N/A] Classification: [1:Medium] [2:Medium] [N/A:N/A] Exudate A mount: [1:Serosanguineous] [2:Serosanguineous] [N/A:N/A] Exudate Type: [1:red, brown] [2:red, brown] [N/A:N/A] Exudate Color: [1:Well defined, not attached] [2:Flat and Intact] [N/A:N/A] Wound Margin: [1:Large (67-100%)] [2:Large (67-100%)] [N/A:N/A] Granulation A mount: [1:Red] [2:Red] [N/A:N/A] Granulation Quality: [1:None Present (0%)] [2:None Present (0%)] [N/A:N/A] Necrotic A mount: [1:Fat  Layer (Subcutaneous Tissue): Yes Fat Layer (Subcutaneous Tissue): Yes N/A] Exposed Structures: [1:Bone: Yes Fascia: No Tendon: No Muscle: No Joint: No Small (1-33%)] [2:Fascia: No Tendon: No Muscle: No Joint: No Bone: No Small (1-33%)] [N/A:N/A] Epithelialization: [1:Debridement - Excisional] [2:Debridement - Excisional] [N/A:N/A] Debridement: Pre-procedure Verification/Time Out 13:55 [2:13:55] [N/A:N/A] Taken: [1:Other] [2:Other] [N/A:N/A] Pain Control: [1:Callus, Subcutaneous, Slough] [2:Subcutaneous, Slough] [N/A:N/A] Tissue Debrided: [1:Skin/Subcutaneous Tissue] [2:Skin/Subcutaneous Tissue] [N/A:N/A] Level: [1:0.3] [2:0.3] [N/A:N/A] Debridement A (sq cm): [1:rea Curette] [2:Curette] [N/A:N/A] Instrument: [1:Minimum] [2:Minimum] [N/A:N/A] Bleeding: [1:Pressure] [2:Pressure] [N/A:N/A] Hemostasis A chieved: [1:0] [2:0] [N/A:N/A] Procedural Pain: [1:0] [2:0] [N/A:N/A] Post Procedural Pain: [1:Procedure was tolerated well] [2:Procedure was tolerated well] [N/A:N/A] Debridement Treatment Response: [1:0.5x0.6x0.4] [2:0.6x0.5x0.2] [N/A:N/A] Post Debridement Measurements L x W x D (cm) [1:0.094] [2:0.047] [N/A:N/A] Post Debridement Volume: (cm) [1:Debridement] [2:Debridement] [N/A:N/A] Treatment Notes Electronic Signature(s) Signed: 11/29/2021 2:03:31 PM By: Duanne Guess MD FACS Signed: 11/29/2021 6:28:42 PM By: Zenaida Deed RN, BSN Entered By: Duanne Guess on 11/29/2021 14:03:30 -------------------------------------------------------------------------------- Multi-Disciplinary Care Plan Details Patient Name: Date of Service: Robert Majestic D. 11/29/2021 1:15 PM Medical Record Number: [id number] Patient Account Number: [id number] Date of Birth/Sex: Treating RN: 10/13/1955 (66 y.o. Robert [name] Primary Care Monserrath Junio: PRA CTICE, PLEA SA NT Other Clinician: Referring Kateryna Grantham: Treating  Antolin/Extender: Duanne Guess PRA CTICE, PLEA SA NT Weeks in Treatment:  4 Multidisciplinary Care Plan reviewed with physician Active Inactive Abuse / Safety / Falls / Self Care Management Nursing Diagnoses: History of Falls Potential for falls Potential for injury related to falls Goals: Patient/caregiver will verbalize/demonstrate measures taken to prevent injury and/or falls Date Initiated: 11/01/2021 Target Resolution Date: 12/20/2021 Goal Status: Active Interventions: Assess fall risk on admission and as needed Notes: Nutrition Nursing Diagnoses: Impaired glucose control: actual or potential Potential for alteratiion in Nutrition/Potential for imbalanced nutrition Goals: Patient/caregiver will maintain therapeutic glucose control Date Initiated: 11/01/2021 Target Resolution Date: 12/20/2021 Goal Status: Active Interventions: Assess HgA1c results as ordered upon admission and as needed Provide education on elevated blood sugars and impact on wound healing Treatment Activities: Patient referred to Primary Care Physician for further nutritional evaluation : 11/01/2021 Notes: Wound/Skin Impairment Nursing Diagnoses: Impaired tissue integrity Knowledge deficit related to ulceration/compromised skin integrity Goals: Patient/caregiver will verbalize understanding of skin care regimen Date Initiated: 11/01/2021 Target Resolution Date: 12/20/2021 Goal Status: Active Ulcer/skin breakdown will have a volume reduction of 30% by week 4 Date Initiated: 11/01/2021 Date Inactivated: 11/22/2021 Target Resolution Date: 11/22/2021 Goal Status: Unmet Unmet Reason: bone expoded Ulcer/skin breakdown will have a volume reduction of 50% by week 8 Date Initiated: 11/22/2021 Target Resolution Date: 12/20/2021 Goal Status: Active  Interventions: Assess patient/caregiver ability to obtain necessary supplies Assess patient/caregiver ability to perform ulcer/skin care regimen upon admission and as needed Assess ulceration(s) every visit Provide education on ulcer and skin  care Treatment Activities: Skin care regimen initiated : 11/01/2021 Topical wound management initiated : 11/01/2021 Notes: Electronic Signature(s) Signed: 11/30/2021 12:43:38 PM By: Redmond Pulling RN, BSN Entered By: Redmond Pulling on 11/29/2021 14:05:48 -------------------------------------------------------------------------------- Pain Assessment Details Patient Name: Date of Service: Robert Majestic D. 11/29/2021 1:15 PM Medical Record Number: [id number] Patient Account Number: [id number] Date of Birth/Sex: Treating RN: [birth year] (66 y.o. Robert [name] Primary Care Alese Furniss: PRA CTICE, PLEA SA NT Other Clinician: Referring Eleri Ruben: Treating Nesiah Jump/Extender: Duanne Guess PRA CTICE, PLEA SA NT Weeks in Treatment: 4 Active Problems Location of Pain Severity and Description of Pain Patient Has Paino Yes Site Locations Pain Location: Generalized Pain With Dressing Change: No Duration of the Pain. Constant / Intermittento Constant Rate the pain. Current Pain Level: 5 Character of Pain Describe the Pain: Aching Pain Management and Medication Current Pain Management: Medication: Yes Is the Current Pain Management Adequate: Adequate Rest: Yes How does your wound impact your activities of daily livingo Sleep: No Bathing: No Appetite: No Relationship With Others: No Bladder Continence: No Emotions: No Bowel Continence: No Work: No Toileting: No Drive: No Dressing: No Hobbies: No Notes pt reports sprained right ankle over the weekend Electronic Signature(s) Signed: 11/29/2021 6:28:42 PM By: Zenaida Deed RN, BSN Entered By: Zenaida Deed on 11/29/2021 13:21:21 -------------------------------------------------------------------------------- Patient/Caregiver Education Details Patient Name: Date of Service: Robert Majestic D. 5/17/2023andnbsp1:15 PM Medical Record Number: [id number] Patient Account Number: [id number] Date of Birth/Gender: Treating  RN: Oct 30, 1955 (66 y.o. Robert [name] Primary Care Physician: PRA CTICE, PLEA SA NT Other Clinician: Referring Physician: Treating Physician/Extender: Duanne Guess PRA CTICE, PLEA SA NT Weeks in Treatment: 4 Education Assessment Education Provided To: Patient Education Topics Provided Wound/Skin Impairment: Methods: Explain/Verbal Responses: State content correctly Electronic Signature(s) Signed: 11/30/2021 12:43:38 PM By: Redmond Pulling RN, BSN Entered By: Redmond Pulling on 11/29/2021 14:06:55 -------------------------------------------------------------------------------- Wound Assessment Details Patient Name: Date of Service: Robert Majestic D. 11/29/2021 1:15 PM Medical Record Number: [id number] Patient Account Number: [id number] Date of Birth/Sex: Treating RN: 06-Jan-1956 (66 y.o. Robert [name] Primary Care Bayleigh Loflin: PRA CTICE, PLEA SA NT Other Clinician: Referring Alwin Lanigan: Treating Xin Klawitter/Extender: Duanne Guess PRA CTICE, PLEA SA NT Weeks in Treatment: 4 Wound Status Wound Number: 1 Primary Diabetic Wound/Ulcer of the Lower Extremity Etiology: Wound Location: Left, Plantar T Third oe Wound Open Wounding Event: Gradually Appeared Status: Date Acquired: 02/13/2021 Comorbid Chronic sinus problems/congestion, Lymphedema, Asthma, Sleep Weeks Of Treatment: 4 History: Apnea, Hepatitis B, Hepatitis C, Type II Diabetes, Gout, Clustered Wound: No Osteoarthritis, Osteomyelitis, Neuropathy Photos Wound Measurements Length: (cm) 0.5 Width: (cm) 0.6 Depth: (cm) 0.4 Area: (cm) 0.236 Volume: (cm) 0.094 % Reduction in Area: 46.4% % Reduction in Volume: -6.8% Epithelialization: Small (1-33%) Tunneling: No Undermining: Yes Starting Position (o'clock): 12 Ending Position (o'clock): 12 Maximum Distance: (cm) 0.2 Wound Description Classification: Grade 2 Wound Margin: Well defined, not attached Exudate Amount: Medium Exudate Type: Serosanguineous Exudate  Color: red, brown Foul Odor After Cleansing: No Slough/Fibrino No Wound Bed Granulation Amount: Large (67-100%) Exposed Structure Granulation Quality: Red Fascia Exposed: No Necrotic Amount: None Present (0%) Fat Layer (Subcutaneous Tissue) Exposed: Yes Tendon Exposed: No Muscle Exposed: No Joint Exposed: No Bone Exposed: Yes Treatment Notes Wound #1 (Toe Third) Wound Laterality: Plantar, Left Cleanser Wound Cleanser Discharge Instruction: Cleanse the  wound with wound cleanser prior to applying a clean dressing using gauze sponges, not tissue or cotton balls. Peri-Wound Care Topical Primary Dressing Promogran Prisma Matrix, 4.34 (sq in) (silver collagen) Discharge Instruction: Moisten collagen with saline or hydrogel Secondary Dressing Optifoam Non-Adhesive Dressing, 4x4 in Discharge Instruction: Apply over primary dressing cut to make foam doughnut Woven Gauze Sponges 2x2 in Discharge Instruction: Apply over primary dressing as directed. Secured With Conforming Stretch Gauze Bandage, Sterile 2x75 (in/in) Discharge Instruction: Secure with stretch gauze as directed. Compression Wrap Compression Stockings Add-Ons Electronic Signature(s) Signed: 11/30/2021 12:43:38 PM By: Redmond PullingPalmer, Carrie RN, BSN Entered By: Redmond PullingPalmer, Carrie on 11/29/2021 13:24:57 -------------------------------------------------------------------------------- Wound Assessment Details Patient Name: Date of Service: Robert MajesticJO RDA N, Jamyson D. 11/29/2021 1:15 PM Medical Record Number: [id number] Patient Account Number: [id number] Date of Birth/Sex: Treating RN: 04/10/1956 (66 y.o. Robert CoolsM) Palmer, Carrie Primary Care Suzetta Timko: PRA CTICE, PLEA SA NT Other Clinician: Referring Liley Rake: Treating Conna Terada/Extender: Duanne Guessannon, Jennifer PRA CTICE, PLEA SA NT Weeks in Treatment: 4 Wound Status Wound Number: 2 Primary Diabetic Wound/Ulcer of the Lower Extremity Etiology: Wound Location: Left, Distal, Plantar T Third oe Wound  Open Wounding Event: Blister Status: Date Acquired: 11/22/2021 Comorbid Chronic sinus problems/congestion, Lymphedema, Asthma, Sleep Weeks Of Treatment: 1 History: Apnea, Hepatitis B, Hepatitis C, Type II Diabetes, Gout, Clustered Wound: No Osteoarthritis, Osteomyelitis, Neuropathy Photos Wound Measurements Length: (cm) 0.6 Width: (cm) 0.5 Depth: (cm) 0.2 Area: (cm) 0.236 Volume: (cm) 0.047 % Reduction in Area: -100% % Reduction in Volume: -95.8% Epithelialization: Small (1-33%) Tunneling: No Undermining: No Wound Description Classification: Grade 1 Wound Margin: Flat and Intact Exudate Amount: Medium Exudate Type: Serosanguineous Exudate Color: red, brown Foul Odor After Cleansing: No Slough/Fibrino No Wound Bed Granulation Amount: Large (67-100%) Exposed Structure Granulation Quality: Red Fascia Exposed: No Necrotic Amount: None Present (0%) Fat Layer (Subcutaneous Tissue) Exposed: Yes Tendon Exposed: No Muscle Exposed: No Joint Exposed: No Bone Exposed: No Treatment Notes Wound #2 (Toe Third) Wound Laterality: Plantar, Left, Distal Cleanser Wound Cleanser Discharge Instruction: Cleanse the wound with wound cleanser prior to applying a clean dressing using gauze sponges, not tissue or cotton balls. Peri-Wound Care Topical Primary Dressing Promogran Prisma Matrix, 4.34 (sq in) (silver collagen) Discharge Instruction: Moisten collagen with saline or hydrogel Secondary Dressing Woven Gauze Sponges 2x2 in Discharge Instruction: Apply over primary dressing as directed. Secured With Conforming Stretch Gauze Bandage, Sterile 2x75 (in/in) Discharge Instruction: Secure with stretch gauze as directed. Compression Wrap Compression Stockings Add-Ons Electronic Signature(s) Signed: 11/30/2021 12:43:38 PM By: Redmond PullingPalmer, Carrie RN, BSN Entered By: Redmond PullingPalmer, Carrie on 11/29/2021 13:26:23 -------------------------------------------------------------------------------- Vitals  Details Patient Name: Date of Service: Robert MajesticJO RDA N, Kauan D. 11/29/2021 1:15 PM Medical Record Number: [id number] Patient Account Number: [id number] Date of Birth/Sex: Treating RN: 04/10/1956 (66 y.o. Robert SchoonerM) Boehlein, Linda Primary Care  Grosser: PRA CTICE, PLEA SA NT Other Clinician: Referring Xiomara Sevillano: Treating Levora Werden/Extender: Duanne Guessannon, Jennifer PRA CTICE, PLEA SA NT Weeks in Treatment: 4 Vital Signs Time Taken: 13:18 Temperature (F): 98.2 Height (in): 72 Pulse (bpm): 85 Weight (lbs): 327 Respiratory Rate (breaths/min): 18 Body Mass Index (BMI): 44.3 Blood Pressure (mmHg): 108/68 Reference Range: 80 - 120 mg / dl Electronic Signature(s) Signed: 11/29/2021 6:28:42 PM By: Zenaida DeedBoehlein, Linda RN, BSN Entered By: Zenaida DeedBoehlein, Linda on 11/29/2021 13:20:11

## 2021-11-30 NOTE — Progress Notes (Signed)
Robert, [name] ([id number]) Visit Report for 11/29/2021 Chief Complaint Document Details Patient Name: Date of Service: Robert [name], Robert [name] 11/29/2021 1:15 PM Medical Record Number: [id number] Patient Account Number: [id number] Date of Birth/Sex: Treating RN: 1956/05/13 (66 y.o. [name] Primary Care Provider: PRA CTICE, PLEA SA NT Other Clinician: Referring Provider: Treating Provider/Extender: Fredirick Maudlin PRA CTICE, PLEA SA NT Weeks in Treatment: 4 Information Obtained from: Patient Chief Complaint Patients presents for treatment of an open diabetic ulcer Electronic Signature(s) Signed: 11/29/2021 2:05:20 PM By: Fredirick Maudlin MD FACS Entered By: Fredirick Maudlin on 11/29/2021 14:05:20 -------------------------------------------------------------------------------- Debridement Details Patient Name: Date of Service: Robert Gates D. 11/29/2021 1:15 PM Medical Record Number: [id number] Patient Account Number: [id number] Date of Birth/Sex: Treating RN: 1955-12-23 (65 y.o. Mare Ferrari Primary Care Provider: PRA CTICE, PLEA SA NT Other Clinician: Referring Provider: Treating Provider/Extender: Fredirick Maudlin PRA CTICE, PLEA SA NT Weeks in Treatment: 4 Debridement Performed for Assessment: Wound #2 Left,Distal,Plantar T Third oe Performed By: Physician Fredirick Maudlin, MD Debridement Type: Debridement Severity of Tissue Pre Debridement: Fat layer exposed Level of Consciousness (Pre-procedure): Awake and Alert Pre-procedure Verification/Time Out Yes - 13:55 Taken: Start Time: 13:55 Pain Control: Other : Benzocaine20% spray T Area Debrided (L x W): otal 0.6 (cm) x 0.5 (cm) = 0.3 (cm) Tissue and other material debrided: Non-Viable, Slough, Subcutaneous, Slough Level: Skin/Subcutaneous Tissue Debridement Description: Excisional Instrument: Curette Bleeding: Minimum Hemostasis Achieved: Pressure Procedural Pain: 0 Post Procedural Pain: 0 Response to  Treatment: Procedure was tolerated well Level of Consciousness (Post- Awake and Alert procedure): Post Debridement Measurements of Total Wound Length: (cm) 0.6 Width: (cm) 0.5 Depth: (cm) 0.2 Volume: (cm) 0.047 Character of Wound/Ulcer Post Debridement: Improved Severity of Tissue Post Debridement: Fat layer exposed Post Procedure Diagnosis Same as Pre-procedure Electronic Signature(s) Signed: 11/29/2021 2:52:08 PM By: Fredirick Maudlin MD FACS Signed: 11/30/2021 12:43:38 PM By: Sharyn Creamer RN, BSN Entered By: Sharyn Creamer on 11/29/2021 13:59:58 -------------------------------------------------------------------------------- Debridement Details Patient Name: Date of Service: Robert Gates D. 11/29/2021 1:15 PM Medical Record Number: [id number] Patient Account Number: [id number] Date of Birth/Sex: Treating RN: 1955-08-01 (66 y.o. Mare Ferrari Primary Care Provider: PRA CTICE, PLEA SA NT Other Clinician: Referring Provider: Treating Provider/Extender: Fredirick Maudlin PRA CTICE, PLEA SA NT Weeks in Treatment: 4 Debridement Performed for Assessment: Wound #1 Left,Plantar T Third oe Performed By: Physician Fredirick Maudlin, MD Debridement Type: Debridement Severity of Tissue Pre Debridement: Fat layer exposed Level of Consciousness (Pre-procedure): Awake and Alert Pre-procedure Verification/Time Out Yes - 13:55 Taken: Start Time: 13:55 Pain Control: Other : Benzocaine20% spray T Area Debrided (L x W): otal 0.5 (cm) x 0.6 (cm) = 0.3 (cm) Tissue and other material debrided: Non-Viable, Callus, Slough, Subcutaneous, Slough Level: Skin/Subcutaneous Tissue Debridement Description: Excisional Instrument: Curette Bleeding: Minimum Hemostasis Achieved: Pressure Procedural Pain: 0 Post Procedural Pain: 0 Response to Treatment: Procedure was tolerated well Level of Consciousness (Post- Awake and Alert procedure): Post Debridement Measurements of Total Wound Length:  (cm) 0.6 Width: (cm) 0.8 Depth: (cm) 0.4 Volume: (cm) 0.151 Character of Wound/Ulcer Post Debridement: Improved Severity of Tissue Post Debridement: Fat layer exposed Post Procedure Diagnosis Same as Pre-procedure Electronic Signature(s) Signed: 11/29/2021 2:52:08 PM By: Fredirick Maudlin MD FACS Signed: 11/30/2021 12:43:38 PM By: Sharyn Creamer RN, BSN Entered By: Sharyn Creamer on 11/29/2021 14:04:44 -------------------------------------------------------------------------------- HPI Details Patient Name: Date of Service: Robert Gates D. 11/29/2021 1:15 PM Medical Record Number: [id number] Patient Account Number: [id number] Date of Birth/Sex:  Treating RN: October 01, 1955 (66 y.o. [name] Primary Care Provider: PRA CTICE, PLEA SA NT Other Clinician: Referring Provider: Treating Provider/Extender: Fredirick Maudlin PRA CTICE, PLEA SA NT Weeks in Treatment: 4 History of Present Illness HPI Description: ADMISSION 11/01/2021 This is a 66 year old man with a past medical history significant for type 2 diabetes mellitus, morbid obesity, hypertension, and venous insufficiency. 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 In August 2022, he developed an ulcer on his left third toe. His primary care provider has had him on several rounds of oral doxycycline. He is currently taking that now. He has been treating it at home with Betadine and hydrogen peroxide, as well as topical mupirocin ointment, also provided by his primary care provider. The most recent A1c that I have available to review was from September 2022 and was 6.6. ABI in clinic today was normal at 1.13. On the plantar surface of his left third toe, there is a well-circumscribed ulcer with periwound callus. The surface is a little bit dry but otherwise clean. The toe itself is fairly red and swollen. 11/08/2021: An x-ray performed last week was negative for osteomyelitis. He  did have a PCR culture that came back with 2 different species, both sensitive to Augmentin. Augmentin was duly prescribed. His wound is clean and there is minimal periwound callus. Light thin slough on the surface. We have been using Prisma silver collagen. The toe remains red and swollen and quite painful. 11/15/2021: He has been tolerating the Augmentin. His toe is much less red and swollen and the pain is markedly decreased. The wound bed is clean with good granulation tissue. He has accumulated periwound callus once again. 11/22/2021: Unfortunately, the wound on his distal third toe probes to bone on today's inspection. There is no increase in erythema nor any purulent drainage but this is of course concerning for development of osteomyelitis. He has also developed a new ulcer on the plantar base of the same toe. It looks like it is probably secondary to friction based upon the deformities of his toes and pressure points. 11/29/2021: His MRI is scheduled for next Monday, May 22. No significant change to either of his wounds. Unfortunately on Saturday, he rolled his ankle and has a peroneal tendon tear on the right. He is unable to wear both his offloading shoe from [place] and the cam boot from podiatry at the same time. Electronic Signature(s) Signed: 11/29/2021 2:06:20 PM By: Fredirick Maudlin MD FACS Entered By: Fredirick Maudlin on 11/29/2021 14:06:20 -------------------------------------------------------------------------------- Physical Exam Details Patient Name: Date of Service: Robert Gates D. 11/29/2021 1:15 PM Medical Record Number: [id number] Patient Account Number: [id number] Date of Birth/Sex: Treating RN: 1956/03/21 (66 y.o. [name] Primary Care Provider: PRA CTICE, PLEA SA NT Other Clinician: Referring Provider: Treating Provider/Extender: Fredirick Maudlin PRA CTICE, PLEA SA NT Weeks in Treatment: 4 Constitutional . . . . No acute distress. Respiratory Normal work of  breathing on room air. Notes 11/29/2021: No significant change to either of his wounds, except that the wound that was new last week is deeper and does involve the fat layer. There is accumulation of periwound callus on both sites. Electronic Signature(s) Signed: 11/29/2021 2:09:49 PM By: Fredirick Maudlin MD FACS Entered By: Fredirick Maudlin on 11/29/2021 14:09:49 -------------------------------------------------------------------------------- Physician Orders Details Patient Name: Date of Service: Robert Gates D. 11/29/2021 1:15 PM Medical Record Number: [id number] Patient Account Number: [id number] Date of Birth/Sex: Treating RN:  05-May-1956 (66 y.o. Mare Ferrari Primary Care Provider: PRA CTICE, PLEA SA NT Other Clinician: Referring Provider: Treating Provider/Extender: Fredirick Maudlin PRA CTICE, PLEA SA NT Weeks in Treatment: 4 Verbal / Phone Orders: No Diagnosis Coding ICD-10 Coding Code Description L97.522 Non-pressure chronic ulcer of other part of left foot with fat layer exposed E11.621 Type 2 diabetes mellitus with foot ulcer I87.2 Venous insufficiency (chronic) (peripheral) E66.01 Morbid (severe) obesity due to excess calories E11.8 Type 2 diabetes mellitus with unspecified complications [id number] Essential (primary) hypertension Follow-up Appointments ppointment in 1 week. - Dr. Celine Ahr Room 1 Return A Bathing/ Shower/ Hygiene May shower and wash wound with soap and water. Edema Control - Lymphedema / SCD / Other Elevate legs to the level of the heart or above for 30 minutes daily and/or when sitting, a frequency of: Avoid standing for long periods of time. Patient to wear own compression stockings every day. Exercise regularly Off-Loading Wedge shoe to: - front off-loading shoe to left foot when ambulating Wound Treatment Wound #1 - T Third oe Wound Laterality: Plantar, Left Cleanser: Wound Cleanser Every Other Day/30 Days Discharge Instructions: Cleanse the  wound with wound cleanser prior to applying a clean dressing using gauze sponges, not tissue or cotton balls. Prim Dressing: Promogran Prisma Matrix, 4.34 (sq in) (silver collagen) Every Other Day/30 Days ary Discharge Instructions: Moisten collagen with saline or hydrogel Secondary Dressing: Optifoam Non-Adhesive Dressing, 4x4 in Every Other Day/30 Days Discharge Instructions: Apply over primary dressing cut to make foam doughnut Secondary Dressing: Woven Gauze Sponges 2x2 in Every Other Day/30 Days Discharge Instructions: Apply over primary dressing as directed. Secured With: Child psychotherapist, Sterile 2x75 (in/in) Every Other Day/30 Days Discharge Instructions: Secure with stretch gauze as directed. Wound #2 - T Third oe Wound Laterality: Plantar, Left, Distal Cleanser: Wound Cleanser Every Other Day/30 Days Discharge Instructions: Cleanse the wound with wound cleanser prior to applying a clean dressing using gauze sponges, not tissue or cotton balls. Prim Dressing: Promogran Prisma Matrix, 4.34 (sq in) (silver collagen) Every Other Day/30 Days ary Discharge Instructions: Moisten collagen with saline or hydrogel Secondary Dressing: Woven Gauze Sponges 2x2 in Every Other Day/30 Days Discharge Instructions: Apply over primary dressing as directed. Secured With: Child psychotherapist, Sterile 2x75 (in/in) Every Other Day/30 Days Discharge Instructions: Secure with stretch gauze as directed. Electronic Signature(s) Signed: 11/29/2021 2:52:08 PM By: Fredirick Maudlin MD FACS Entered By: Fredirick Maudlin on 11/29/2021 14:10:04 -------------------------------------------------------------------------------- Problem List Details Patient Name: Date of Service: Robert Gates D. 11/29/2021 1:15 PM Medical Record Number: [id number] Patient Account Number: [id number] Date of Birth/Sex: Treating RN: 03/22/1956 (66 y.o. [name] Primary Care Provider: PRA CTICE,  PLEA SA NT Other Clinician: Referring Provider: Treating Provider/Extender: Fredirick Maudlin PRA CTICE, PLEA SA NT Weeks in Treatment: 4 Active Problems ICD-10 Encounter Code Description Active Date MDM Diagnosis L97.522 Non-pressure chronic ulcer of other part of left foot with fat layer exposed 11/01/2021 No Yes E11.621 Type 2 diabetes mellitus with foot ulcer 11/01/2021 No Yes I87.2 Venous insufficiency (chronic) (peripheral) 11/01/2021 No Yes E66.01 Morbid (severe) obesity due to excess calories 11/01/2021 No Yes E11.8 Type 2 diabetes mellitus with unspecified complications [id number] No Yes I10 Essential (primary) hypertension 11/01/2021 No Yes Inactive Problems Resolved Problems Electronic Signature(s) Signed: 11/29/2021 2:02:21 PM By: Fredirick Maudlin MD FACS Entered By: Fredirick Maudlin on 11/29/2021 14:02:21 -------------------------------------------------------------------------------- Progress Note Details Patient Name: Date of Service: Robert Gates D. 11/29/2021 1:15 PM Medical Record Number: [id number]  Patient Account Number: [id number] Date of Birth/Sex: Treating RN: 05/15/1956 (66 y.o. [name] Primary Care Provider: PRA CTICE, PLEA SA NT Other Clinician: Referring Provider: Treating Provider/Extender: Fredirick Maudlin PRA CTICE, PLEA SA NT Weeks in Treatment: 4 Subjective Chief Complaint Information obtained from Patient Patients presents for treatment of an open diabetic ulcer History of Present Illness (HPI) ADMISSION 11/01/2021 This is a 66 year old man with a past medical history significant for type 2 diabetes mellitus, morbid obesity, hypertension, and venous insufficiency. 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 In August 2022, he developed an ulcer on his left third toe. His primary care provider has had him on several rounds of oral doxycycline. He is currently taking that now. He has  been treating it at home with Betadine and hydrogen peroxide, as well as topical mupirocin ointment, also provided by his primary care provider. The most recent A1c that I have available to review was from September 2022 and was 6.6. ABI in clinic today was normal at 1.13. On the plantar surface of his left third toe, there is a well-circumscribed ulcer with periwound callus. The surface is a little bit dry but otherwise clean. The toe itself is fairly red and swollen. 11/08/2021: An x-ray performed last week was negative for osteomyelitis. He did have a PCR culture that came back with 2 different species, both sensitive to Augmentin. Augmentin was duly prescribed. His wound is clean and there is minimal periwound callus. Light thin slough on the surface. We have been using Prisma silver collagen. The toe remains red and swollen and quite painful. 11/15/2021: He has been tolerating the Augmentin. His toe is much less red and swollen and the pain is markedly decreased. The wound bed is clean with good granulation tissue. He has accumulated periwound callus once again. 11/22/2021: Unfortunately, the wound on his distal third toe probes to bone on today's inspection. There is no increase in erythema nor any purulent drainage but this is of course concerning for development of osteomyelitis. He has also developed a new ulcer on the plantar base of the same toe. It looks like it is probably secondary to friction based upon the deformities of his toes and pressure points. 11/29/2021: His MRI is scheduled for next Monday, May 22. No significant change to either of his wounds. Unfortunately on Saturday, he rolled his ankle and has a peroneal tendon tear on the right. He is unable to wear both his offloading shoe from [place] and the cam boot from podiatry at the same time. Patient History Information obtained from Patient. Family History Diabetes - Siblings, Stroke - Siblings, Thyroid Problems - Siblings, No family  history of Cancer, Heart Disease, Hereditary Spherocytosis, Hypertension, Kidney Disease, Lung Disease, Seizures, Tuberculosis. Social History Former smoker - .25 packs/day for 15 years - ended on 07/16/2004, Marital Status - Married, Alcohol Use - Never, Drug Use - Prior History, Caffeine Use - Daily. Medical History Eyes Denies history of Cataracts, Glaucoma, Optic Neuritis Ear/Nose/Mouth/Throat Patient has history of Chronic sinus problems/congestion - allergic rhinitis Hematologic/Lymphatic Patient has history of Lymphedema Respiratory Patient has history of Asthma, Sleep Apnea Gastrointestinal Patient has history of Hepatitis B, Hepatitis C Endocrine Patient has history of Type II Diabetes Integumentary (Skin) Denies history of History of Burn Musculoskeletal Patient has history of Gout, Osteoarthritis, 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 Neurologic Patient has history of Neuropathy Hospitalization/Surgery History - amputation R second toe. - total knee arthroplasty. - abdominal  surgery - hernia repair x6. - back surgery - rods and screws in lumbar area. - colonoscopy. - esophagogastroduodenoscopy. - joint replacement. Medical A Surgical History Notes nd Respiratory restrictive lung disease secondary to obesity Gastrointestinal exposure to hep B and C, Barrett's esophagus Musculoskeletal lumbar radiculopathy, DJD, amputation of second toe R Psychiatric depression Objective Constitutional No acute distress. Vitals Time Taken: 1:18 PM, Height: 72 in, Weight: 327 lbs, BMI: 44.3, Temperature: 98.2 F, Pulse: 85 bpm, Respiratory Rate: 18 breaths/min, Blood Pressure: 108/68 mmHg. Respiratory Normal work of breathing on room air. General Notes: 11/29/2021: No significant change to either of his wounds, except that the wound that was new last week is deeper and does involve the fat layer. There is accumulation of periwound callus on both sites. Integumentary (Hair, Skin) Wound  #1 status is Open. Original cause of wound was Gradually Appeared. The date acquired was: 02/13/2021. The wound has been in treatment 4 weeks. The wound is located on the Left,Plantar T Third. The wound measures 0.5cm length x 0.6cm width x 0.4cm depth; 0.236cm^2 area and 0.094cm^3 volume. There oe is bone and Fat Layer (Subcutaneous Tissue) exposed. There is no tunneling noted, however, there is undermining starting at 12:00 and ending at 12:00 with a maximum distance of 0.2cm. There is a medium amount of serosanguineous drainage noted. The wound margin is well defined and not attached to the wound base. There is large (67-100%) red granulation within the wound bed. There is no necrotic tissue within the wound bed. Wound #2 status is Open. Original cause of wound was Blister. The date acquired was: 11/22/2021. The wound has been in treatment 1 weeks. The wound is located on the Left,Distal,Plantar T Third. The wound measures 0.6cm length x 0.5cm width x 0.2cm depth; 0.236cm^2 area and 0.047cm^3 volume. There is oe Fat Layer (Subcutaneous Tissue) exposed. There is no tunneling or undermining noted. There is a medium amount of serosanguineous drainage noted. The wound margin is flat and intact. There is large (67-100%) red granulation within the wound bed. There is no necrotic tissue within the wound bed. Assessment Active Problems ICD-10 Non-pressure chronic ulcer of other part of left foot with fat layer exposed Type 2 diabetes mellitus with foot ulcer Venous insufficiency (chronic) (peripheral) Morbid (severe) obesity due to excess calories Type 2 diabetes mellitus with unspecified complications Essential (primary) hypertension Procedures Wound #1 Pre-procedure diagnosis of Wound #1 is a Diabetic Wound/Ulcer of the Lower Extremity located on the Left,Plantar T Third .Severity of Tissue Pre oe Debridement is: Fat layer exposed. There was a Excisional Skin/Subcutaneous Tissue Debridement with a  total area of 0.3 sq cm performed by Fredirick Maudlin, MD. With the following instrument(s): Curette to remove Non-Viable tissue/material. Material removed includes Callus, Subcutaneous Tissue, and Slough after achieving pain control using Other (Benzocaine20% spray). No specimens were taken. A time out was conducted at 13:55, prior to the start of the procedure. A Minimum amount of bleeding was controlled with Pressure. The procedure was tolerated well with a pain level of 0 throughout and a pain level of 0 following the procedure. Post Debridement Measurements: 0.6cm length x 0.8cm width x 0.4cm depth; 0.151cm^3 volume. Character of Wound/Ulcer Post Debridement is improved. Severity of Tissue Post Debridement is: Fat layer exposed. Post procedure Diagnosis Wound #1: Same as Pre-Procedure Wound #2 Pre-procedure diagnosis of Wound #2 is a Diabetic Wound/Ulcer of the Lower Extremity located on the Left,Distal,Plantar T Third .Severity of Tissue Pre oe Debridement is: Fat layer exposed. There was a  Excisional Skin/Subcutaneous Tissue Debridement with a total area of 0.3 sq cm performed by Fredirick Maudlin, MD. With the following instrument(s): Curette to remove Non-Viable tissue/material. Material removed includes Subcutaneous Tissue and Slough and after achieving pain control using Other (Benzocaine20% spray). No specimens were taken. A time out was conducted at 13:55, prior to the start of the procedure. A Minimum amount of bleeding was controlled with Pressure. The procedure was tolerated well with a pain level of 0 throughout and a pain level of 0 following the procedure. Post Debridement Measurements: 0.6cm length x 0.5cm width x 0.2cm depth; 0.047cm^3 volume. Character of Wound/Ulcer Post Debridement is improved. Severity of Tissue Post Debridement is: Fat layer exposed. Post procedure Diagnosis Wound #2: Same as Pre-Procedure Plan Follow-up Appointments: Return Appointment in 1 week. - Dr.  Celine Ahr Room 1 Bathing/ Shower/ Hygiene: May shower and wash wound with soap and water. Edema Control - Lymphedema / SCD / Other: Elevate legs to the level of the heart or above for 30 minutes daily and/or when sitting, a frequency of: Avoid standing for long periods of time. Patient to wear own compression stockings every day. Exercise regularly Off-Loading: Wedge shoe to: - front off-loading shoe to left foot when ambulating WOUND #1: - T Third Wound Laterality: Plantar, Left oe Cleanser: Wound Cleanser Every Other Day/30 Days Discharge Instructions: Cleanse the wound with wound cleanser prior to applying a clean dressing using gauze sponges, not tissue or cotton balls. Prim Dressing: Promogran Prisma Matrix, 4.34 (sq in) (silver collagen) Every Other Day/30 Days ary Discharge Instructions: Moisten collagen with saline or hydrogel Secondary Dressing: Optifoam Non-Adhesive Dressing, 4x4 in Every Other Day/30 Days Discharge Instructions: Apply over primary dressing cut to make foam doughnut Secondary Dressing: Woven Gauze Sponges 2x2 in Every Other Day/30 Days Discharge Instructions: Apply over primary dressing as directed. Secured With: Child psychotherapist, Sterile 2x75 (in/in) Every Other Day/30 Days Discharge Instructions: Secure with stretch gauze as directed. WOUND #2: - T Third Wound Laterality: Plantar, Left, Distal oe Cleanser: Wound Cleanser Every Other Day/30 Days Discharge Instructions: Cleanse the wound with wound cleanser prior to applying a clean dressing using gauze sponges, not tissue or cotton balls. Prim Dressing: Promogran Prisma Matrix, 4.34 (sq in) (silver collagen) Every Other Day/30 Days ary Discharge Instructions: Moisten collagen with saline or hydrogel Secondary Dressing: Woven Gauze Sponges 2x2 in Every Other Day/30 Days Discharge Instructions: Apply over primary dressing as directed. Secured With: Child psychotherapist, Sterile 2x75  (in/in) Every Other Day/30 Days Discharge Instructions: Secure with stretch gauze as directed. 11/29/2021: No significant change to either of his wounds, except that the wound that was new last week is deeper and does involve the fat layer. There is accumulation of periwound callus on both sites. I debrided slough, subcutaneous tissue, and periwound callus from both wounds on his left third toe. Due to the peroneal tendon tear, he needs to wear a cam boot on his right and cannot wear both the cam boot and the offloading shoe on his left foot. For now, he is going to forego the offloading shoe. We will follow- up on the results of his MRI on Monday of next week. I will see him the following Wednesday. Electronic Signature(s) Signed: 11/29/2021 2:11:09 PM By: Fredirick Maudlin MD FACS Entered By: Fredirick Maudlin on 11/29/2021 14:11:08 -------------------------------------------------------------------------------- HxROS Details Patient Name: Date of Service: Robert Gates D. 11/29/2021 1:15 PM Medical Record Number: [id number] Patient Account Number: [id number] Date of Birth/Sex: Treating RN:  [birth year] (66 y.o. [name] Primary Care Provider: PRA CTICE, PLEA SA NT Other Clinician: Referring Provider: Treating Provider/Extender: Fredirick Maudlin PRA CTICE, PLEA SA NT Weeks in Treatment: 4 Information Obtained From Patient Eyes Medical History: Negative for: Cataracts; Glaucoma; Optic Neuritis Ear/Nose/Mouth/Throat Medical History: Positive for: Chronic sinus problems/congestion - allergic rhinitis Hematologic/Lymphatic Medical History: Positive for: Lymphedema Respiratory Medical History: Positive for: Asthma; Sleep Apnea Past Medical History Notes: restrictive lung disease secondary to obesity Gastrointestinal Medical History: Positive for: Hepatitis B; Hepatitis C Past Medical History Notes: exposure to hep B and C, Barrett's esophagus Endocrine Medical  History: Positive for: Type II Diabetes Time with diabetes: 2-3 years ago Treated with: Insulin Blood sugar tested every day: No Integumentary (Skin) Medical History: Negative for: History of Burn Musculoskeletal Medical History: Positive for: Gout; Osteoarthritis; 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 Past Medical History Notes: lumbar radiculopathy, DJD, amputation of second toe R Neurologic Medical History: Positive for: Neuropathy Psychiatric Medical History: Past Medical History Notes: depression HBO Extended History Items Ear/Nose/Mouth/Throat: Chronic sinus problems/congestion Immunizations Pneumococcal Vaccine: Received Pneumococcal Vaccination: No Implantable Devices None Hospitalization / Surgery History Type of Hospitalization/Surgery amputation R second toe total knee arthroplasty abdominal surgery - hernia repair x6 back surgery - rods and screws in lumbar area colonoscopy esophagogastroduodenoscopy joint replacement Family and Social History Cancer: No; Diabetes: Yes - Siblings; Heart Disease: No; Hereditary Spherocytosis: No; Hypertension: No; Kidney Disease: No; Lung Disease: No; Seizures: No; Stroke: Yes - Siblings; Thyroid Problems: Yes - Siblings; Tuberculosis: No; Former smoker - .51 packs/day for 15 years - ended on 07/16/2004; Marital Status - Married; Alcohol Use: Never; Drug Use: Prior History; Caffeine Use: Daily; Financial Concerns: No; Food, Clothing or Shelter Needs: No; Support System Lacking: No; Transportation Concerns: No Electronic Signature(s) Signed: 11/29/2021 2:52:08 PM By: Fredirick Maudlin MD FACS Signed: 11/29/2021 6:28:42 PM By: Baruch Gouty RN, BSN Entered By: Fredirick Maudlin on 11/29/2021 14:07:58 -------------------------------------------------------------------------------- SuperBill Details Patient Name: Date of Service: Robert Gates D. 11/29/2021 Medical Record Number: [id number] Patient Account Number: [id number] Date of  Birth/Sex: Treating RN: 11/26/1955 (66 y.o. [name] Primary Care Provider: PRA CTICE, PLEA SA NT Other Clinician: Referring Provider: Treating Provider/Extender: Fredirick Maudlin PRA CTICE, PLEA SA NT Weeks in Treatment: 4 Diagnosis Coding ICD-10 Codes Code Description [id number] Non-pressure chronic ulcer of other part of left foot with fat layer exposed E11.621 Type 2 diabetes mellitus with foot ulcer I87.2 Venous insufficiency (chronic) (peripheral) E66.01 Morbid (severe) obesity due to excess calories E11.8 Type 2 diabetes mellitus with unspecified complications [id number] Essential (primary) hypertension Facility Procedures CPT4 Code: [id number] Description: [id number] - DEB SUBQ TISSUE 20 SQ CM/< ICD-10 Diagnosis Description L97.522 Non-pressure chronic ulcer of other part of left foot with fat layer exposed E11.621 Type 2 diabetes mellitus with foot ulcer Modifier: Quantity: 1 Physician Procedures : CPT4 Code Description Modifier [id number] - WC PHYS LEVEL 3 - EST PT 25 ICD-10 Diagnosis Description L97.522 Non-pressure chronic ulcer of other part of left foot with fat layer exposed E11.621 Type 2 diabetes mellitus with foot ulcer I87.2 Venous  insufficiency (chronic) (peripheral) Quantity: 1 : [id number] - WC PHYS SUBQ TISS 20 SQ CM ICD-10 Diagnosis Description L97.522 Non-pressure chronic ulcer of other part of left foot with fat layer exposed E11.621 Type 2 diabetes mellitus with foot ulcer Quantity: 1 Electronic Signature(s) Signed: 11/29/2021 2:11:39 PM By: Fredirick Maudlin MD FACS Entered By: Fredirick Maudlin on 11/29/2021 14:11:38

## 2021-12-04 ENCOUNTER — Ambulatory Visit (HOSPITAL_COMMUNITY)
Admission: RE | Admit: 2021-12-04 | Discharge: 2021-12-04 | Disposition: A | Discharge status: Home / Self Care | Payer: Medicare Other | Source: Ambulatory Visit | Attending: General Surgery | Admitting: General Surgery

## 2021-12-04 DIAGNOSIS — E11621 Type 2 diabetes mellitus with foot ulcer: Secondary | ICD-10-CM | POA: Insufficient documentation

## 2021-12-04 DIAGNOSIS — L97509 Non-pressure chronic ulcer of other part of unspecified foot with unspecified severity: Secondary | ICD-10-CM | POA: Insufficient documentation

## 2021-12-04 MED ORDER — GADOBUTROL 1 MMOL/ML IV SOLN
10.0000 mL | Freq: Once | INTRAVENOUS | Status: AC | PRN
  Administered 2021-12-04: 10 mL via INTRAVENOUS

## 2021-12-05 ENCOUNTER — Other Ambulatory Visit: Payer: Self-pay | Admitting: Physician Assistant

## 2021-12-06 ENCOUNTER — Encounter (HOSPITAL_BASED_OUTPATIENT_CLINIC_OR_DEPARTMENT_OTHER): Payer: Medicare Other | Admitting: General Surgery

## 2021-12-06 DIAGNOSIS — E11621 Type 2 diabetes mellitus with foot ulcer: Secondary | ICD-10-CM | POA: Diagnosis not present

## 2021-12-07 NOTE — Progress Notes (Signed)
Robert [name], [name] ([id number]) Visit Report for 12/06/2021 Chief Complaint Document Details Patient Name: Date of Service: Robert [name], Robert [name] 12/06/2021 2:45 PM Medical Record Number: [id number] Patient Account Number: [id number] Date of Birth/Sex: Treating RN: 25-Oct-1955 (66 y.o. Robert [name] Primary Care Provider: PRA CTICE, PLEA SA NT Other Clinician: Referring Provider: Treating Provider/Extender: Duanne Guess PRA CTICE, PLEA SA NT Weeks in Treatment: 5 Information Obtained from: Patient Chief Complaint Patients presents for treatment of an open diabetic ulcer Electronic Signature(s) Signed: 12/06/2021 4:32:46 PM By: Duanne Guess MD FACS Entered By: Duanne Guess on 12/06/2021 16:32:46 -------------------------------------------------------------------------------- Debridement Details Patient Name: Date of Service: Robert Majestic D. 12/06/2021 2:45 PM Medical Record Number: [id number] Patient Account Number: [id number] Date of Birth/Sex: Treating RN: [birth year] (65 y.o. Robert [name] Primary Care Provider: PRA CTICE, PLEA SA NT Other Clinician: Referring Provider: Treating Provider/Extender: Duanne Guess PRA CTICE, PLEA SA NT Weeks in Treatment: 5 Debridement Performed for Assessment: Wound #2 Left,Distal,Plantar T Third oe Performed By: Physician Duanne Guess, MD Debridement Type: Debridement Severity of Tissue Pre Debridement: Fat layer exposed Level of Consciousness (Pre-procedure): Awake and Alert Pre-procedure Verification/Time Out Yes - 15:50 Taken: Start Time: 15:50 Pain Control: Lidocaine 4% T opical Solution T Area Debrided (L x W): otal 0.3 (cm) x 0.3 (cm) = 0.09 (cm) Tissue and other material debrided: Non-Viable, Eschar Level: Non-Viable Tissue Debridement Description: Selective/Open Wound Instrument: Curette Bleeding: Minimum Hemostasis Achieved: Pressure Procedural Pain: 0 Post Procedural Pain: 0 Response to Treatment: Procedure  was tolerated well Level of Consciousness (Post- Awake and Alert procedure): Post Debridement Measurements of Total Wound Length: (cm) 0.1 Width: (cm) 0.1 Depth: (cm) 0.1 Volume: (cm) 0.001 Character of Wound/Ulcer Post Debridement: Improved Severity of Tissue Post Debridement: Fat layer exposed Post Procedure Diagnosis Same as Pre-procedure Electronic Signature(s) Signed: 12/06/2021 6:00:25 PM By: Duanne Guess MD FACS Signed: 12/07/2021 6:06:15 PM By: Zenaida Deed RN, BSN Entered By: Zenaida Deed on 12/06/2021 15:54:00 -------------------------------------------------------------------------------- Debridement Details Patient Name: Date of Service: Robert Majestic D. 12/06/2021 2:45 PM Medical Record Number: [id number] Patient Account Number: [id number] Date of Birth/Sex: Treating RN: 1956/06/13 (66 y.o. Robert [name] Primary Care Provider: PRA CTICE, PLEA SA NT Other Clinician: Referring Provider: Treating Provider/Extender: Duanne Guess PRA CTICE, PLEA SA NT Weeks in Treatment: 5 Debridement Performed for Assessment: Wound #1 Left,Plantar T Third oe Performed By: Physician Duanne Guess, MD Debridement Type: Debridement Severity of Tissue Pre Debridement: Necrosis of bone Level of Consciousness (Pre-procedure): Awake and Alert Pre-procedure Verification/Time Out Yes - 15:50 Taken: Start Time: 15:50 Pain Control: Lidocaine 4% T opical Solution T Area Debrided (L x W): otal 0.6 (cm) x 0.6 (cm) = 0.36 (cm) Tissue and other material debrided: Viable, Non-Viable, Callus, Subcutaneous Level: Skin/Subcutaneous Tissue Debridement Description: Excisional Instrument: Curette Bleeding: Minimum Hemostasis Achieved: Pressure Procedural Pain: 0 Post Procedural Pain: 0 Response to Treatment: Procedure was tolerated well Level of Consciousness (Post- Awake and Alert procedure): Post Debridement Measurements of Total Wound Length: (cm) 0.6 Width: (cm)  0.6 Depth: (cm) 0.2 Volume: (cm) 0.057 Character of Wound/Ulcer Post Debridement: Improved Severity of Tissue Post Debridement: Necrosis of bone Post Procedure Diagnosis Same as Pre-procedure Electronic Signature(s) Signed: 12/06/2021 6:00:25 PM By: Duanne Guess MD FACS Signed: 12/07/2021 6:06:15 PM By: Zenaida Deed RN, BSN Entered By: Zenaida Deed on 12/06/2021 15:55:15 -------------------------------------------------------------------------------- HPI Details Patient Name: Date of Service: Robert Majestic D. 12/06/2021 2:45 PM Medical Record Number: [id number] Patient Account Number: [id number] Date of Birth/Sex:  Treating RN: [birth year] (66 y.o. Robert [name] Primary Care Provider: PRA CTICE, PLEA SA NT Other Clinician: Referring Provider: Treating Provider/Extender: Duanne Guess PRA CTICE, PLEA SA NT Weeks in Treatment: 5 History of Present Illness HPI Description: ADMISSION 11/01/2021 This is a 66 year old man with a past medical history significant for type 2 diabetes mellitus, morbid obesity, hypertension, and venous insufficiency. 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 In August 2022, he developed an ulcer on his left third toe. His primary care provider has had him on several rounds of oral doxycycline. He is currently taking that now. He has been treating it at home with Betadine and hydrogen peroxide, as well as topical mupirocin ointment, also provided by his primary care provider. The most recent A1c that I have available to review was from September 2022 and was 6.6. ABI in clinic today was normal at 1.13. On the plantar surface of his left third toe, there is a well-circumscribed ulcer with periwound callus. The surface is a little bit dry but otherwise clean. The toe itself is fairly red and swollen. 11/08/2021: An x-ray performed last week was negative for osteomyelitis. He did have a PCR culture  that came back with 2 different species, both sensitive to Augmentin. Augmentin was duly prescribed. His wound is clean and there is minimal periwound callus. Light thin slough on the surface. We have been using Prisma silver collagen. The toe remains red and swollen and quite painful. 11/15/2021: He has been tolerating the Augmentin. His toe is much less red and swollen and the pain is markedly decreased. The wound bed is clean with good granulation tissue. He has accumulated periwound callus once again. 11/22/2021: Unfortunately, the wound on his distal third toe probes to bone on today's inspection. There is no increase in erythema nor any purulent drainage but this is of course concerning for development of osteomyelitis. He has also developed a new ulcer on the plantar base of the same toe. It looks like it is probably secondary to friction based upon the deformities of his toes and pressure points. 11/29/2021: His MRI is scheduled for next Monday, May 22. No significant change to either of his wounds. Unfortunately on Saturday, he rolled his ankle and has a peroneal tendon tear on the right. He is unable to wear both his offloading shoe from [place] and the cam boot from podiatry at the same time. 12/06/2021: His MRI was performed and revealed acute osteomyelitis. His toe wound at the tip looks about the same with continued bone exposure; the wound on the plantar surface of the same toe has nearly closed with just a pinpoint opening and some callus buildup. Electronic Signature(s) Signed: 12/06/2021 4:33:36 PM By: Duanne Guess MD FACS Entered By: Duanne Guess on 12/06/2021 16:33:36 -------------------------------------------------------------------------------- Physical Exam Details Patient Name: Date of Service: Robert Majestic D. 12/06/2021 2:45 PM Medical Record Number: [id number] Patient Account Number: [id number] Date of Birth/Sex: Treating RN: [birth year] (66 y.o. Robert [name] Primary  Care Provider: PRA CTICE, PLEA SA NT Other Clinician: Referring Provider: Treating Provider/Extender: Duanne Guess PRA CTICE, PLEA SA NT Weeks in Treatment: 5 Constitutional . . . . No acute distress. Respiratory Normal work of breathing on room air. Notes 12/06/2021: His toe wound at the tip looks about the same with continued bone exposure; the wound on the plantar surface of the same toe has nearly closed with just a pinpoint opening and some callus buildup. Electronic  Signature(s) Signed: 12/06/2021 5:08:52 PM By: Duanne Guess MD FACS Entered By: Duanne Guess on 12/06/2021 17:08:52 -------------------------------------------------------------------------------- Physician Orders Details Patient Name: Date of Service: Robert Majestic D. 12/06/2021 2:45 PM Medical Record Number: [id number] Patient Account Number: [id number] Date of Birth/Sex: Treating RN: [birth year] (66 y.o. Robert [name] Primary Care Provider: PRA CTICE, PLEA SA NT Other Clinician: Referring Provider: Treating Provider/Extender: Duanne Guess PRA CTICE, PLEA SA NT Weeks in Treatment: 5 Verbal / Phone Orders: No Diagnosis Coding ICD-10 Coding Code Description L97.522 Non-pressure chronic ulcer of other part of left foot with fat layer exposed E11.621 Type 2 diabetes mellitus with foot ulcer I87.2 Venous insufficiency (chronic) (peripheral) E66.01 Morbid (severe) obesity due to excess calories E11.8 Type 2 diabetes mellitus with unspecified complications I10 Essential (primary) hypertension Follow-up Appointments ppointment in 2 weeks. - Dr. Lady Gary RM 1 Return A Wed 6/7 @ 2:00 pm Bathing/ Shower/ Hygiene May shower and wash wound with soap and water. Edema Control - Lymphedema / SCD / Other Elevate legs to the level of the heart or above for 30 minutes daily and/or when sitting, a frequency of: Avoid standing for long periods of time. Patient to wear own compression stockings every  day. Exercise regularly Off-Loading Wedge shoe to: - front off-loading shoe to left foot when ambulating Wound Treatment Wound #1 - T Third oe Wound Laterality: Plantar, Left Cleanser: Wound Cleanser Every Other Day/30 Days Discharge Instructions: Cleanse the wound with wound cleanser prior to applying a clean dressing using gauze sponges, not tissue or cotton balls. Prim Dressing: Promogran Prisma Matrix, 4.34 (sq in) (silver collagen) Every Other Day/30 Days ary Discharge Instructions: Moisten collagen with saline or hydrogel Secondary Dressing: Optifoam Non-Adhesive Dressing, 4x4 in Every Other Day/30 Days Discharge Instructions: Apply over primary dressing cut to make foam doughnut Secondary Dressing: Woven Gauze Sponges 2x2 in Every Other Day/30 Days Discharge Instructions: Apply over primary dressing as directed. Secured With: Insurance underwriter, Sterile 2x75 (in/in) Every Other Day/30 Days Discharge Instructions: Secure with stretch gauze as directed. Wound #2 - T Third oe Wound Laterality: Plantar, Left, Distal Cleanser: Wound Cleanser Every Other Day/30 Days Discharge Instructions: Cleanse the wound with wound cleanser prior to applying a clean dressing using gauze sponges, not tissue or cotton balls. Prim Dressing: Promogran Prisma Matrix, 4.34 (sq in) (silver collagen) Every Other Day/30 Days ary Discharge Instructions: Moisten collagen with saline or hydrogel Secondary Dressing: Woven Gauze Sponges 2x2 in Every Other Day/30 Days Discharge Instructions: Apply over primary dressing as directed. Secured With: Insurance underwriter, Sterile 2x75 (in/in) Every Other Day/30 Days Discharge Instructions: Secure with stretch gauze as directed. Consults Podiatry - [facility] - diabetic foot ulcer left 3rd toe with acute osteomyelitis, probable toe amputation - (ICD10 L97.522 - Non-pressure chronic ulcer of other part of left foot with fat layer  exposed) Electronic Signature(s) Signed: 12/06/2021 6:00:25 PM By: Duanne Guess MD FACS Entered By: Duanne Guess on 12/06/2021 17:09:41 Prescription 12/06/2021 -------------------------------------------------------------------------------- Robert [name], [name] Duanne Guess MD Patient Name: Provider: [birth year] [id number] Date of Birth: NPI#: Judie Petit [id number] Sex: DEA #: [phone] [id number] Phone #: License #: Eligha Bridegroom [facility] Patient Address: [address] [place], [place] [address] [address] [place], [place] [address] [phone] Allergies No Known Allergies Provider's Orders Podiatry - ICD10: L97.522 - [facility] - diabetic foot ulcer left 3rd toe with acute osteomyelitis, probable toe amputation Hand Signature: Date(s): Electronic Signature(s) Signed:  12/06/2021 5:12:55 PM By: Duanne Guess MD FACS Entered By: Duanne Guess on 12/06/2021 17:12:55 -------------------------------------------------------------------------------- Problem List Details Patient Name: Date of Service: Robert Majestic D. 12/06/2021 2:45 PM Medical Record Number: [id number] Patient Account Number: [id number] Date of Birth/Sex: Treating RN: 09-07-1955 (66 y.o. Robert [name] Primary Care Provider: PRA CTICE, PLEA SA NT Other Clinician: Referring Provider: Treating Provider/Extender: Duanne Guess PRA CTICE, PLEA SA NT Weeks in Treatment: 5 Active Problems ICD-10 Encounter Code Description Active Date MDM Diagnosis L97.522 Non-pressure chronic ulcer of other part of left foot with fat layer exposed 11/01/2021 No Yes E11.621 Type 2 diabetes mellitus with foot ulcer 11/01/2021 No Yes I87.2 Venous insufficiency (chronic) (peripheral) 11/01/2021 No Yes E66.01 Morbid (severe) obesity due to excess calories 11/01/2021 No Yes E11.8 Type 2 diabetes mellitus with unspecified complications 11/01/2021 No Yes I10 Essential (primary)  hypertension 11/01/2021 No Yes Inactive Problems Resolved Problems Electronic Signature(s) Signed: 12/06/2021 4:32:34 PM By: Duanne Guess MD FACS Entered By: Duanne Guess on 12/06/2021 16:32:34 -------------------------------------------------------------------------------- Progress Note Details Patient Name: Date of Service: Robert Majestic D. 12/06/2021 2:45 PM Medical Record Number: [id number] Patient Account Number: [id number] Date of Birth/Sex: Treating RN: Nov 10, 1955 (65 y.o. Robert [name] Primary Care Provider: PRA CTICE, PLEA SA NT Other Clinician: Referring Provider: Treating Provider/Extender: Duanne Guess PRA CTICE, PLEA SA NT Weeks in Treatment: 5 Subjective Chief Complaint Information obtained from Patient Patients presents for treatment of an open diabetic ulcer History of Present Illness (HPI) ADMISSION 11/01/2021 This is a 66 year old man with a past medical history significant for type 2 diabetes mellitus, morbid obesity, hypertension, and venous insufficiency. 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 In August 2022, he developed an ulcer on his left third toe. His primary care provider has had him on several rounds of oral doxycycline. He is currently taking that now. He has been treating it at home with Betadine and hydrogen peroxide, as well as topical mupirocin ointment, also provided by his primary care provider. The most recent A1c that I have available to review was from September 2022 and was 6.6. ABI in clinic today was normal at 1.13. On the plantar surface of his left third toe, there is a well-circumscribed ulcer with periwound callus. The surface is a little bit dry but otherwise clean. The toe itself is fairly red and swollen. 11/08/2021: An x-ray performed last week was negative for osteomyelitis. He did have a PCR culture that came back with 2 different species, both sensitive  to Augmentin. Augmentin was duly prescribed. His wound is clean and there is minimal periwound callus. Light thin slough on the surface. We have been using Prisma silver collagen. The toe remains red and swollen and quite painful. 11/15/2021: He has been tolerating the Augmentin. His toe is much less red and swollen and the pain is markedly decreased. The wound bed is clean with good granulation tissue. He has accumulated periwound callus once again. 11/22/2021: Unfortunately, the wound on his distal third toe probes to bone on today's inspection. There is no increase in erythema nor any purulent drainage but this is of course concerning for development of osteomyelitis. He has also developed a new ulcer on the plantar base of the same toe. It looks like it is probably secondary to friction based upon the deformities of his toes and pressure points. 11/29/2021: His MRI is scheduled for next Monday, May 22. No significant change to either of his wounds.  Unfortunately on Saturday, he rolled his ankle and has a peroneal tendon tear on the right. He is unable to wear both his offloading shoe from us and the cam boot from podiatry at the same time. 12/06/2021: His MRI was performed and revealed acute osteomyelitis. His toe wound at the tip looks about the same with continued bone exposure; the wound on the plantar surface of the same toe has nearly closed with just a pinpoint opening and some callus buildup. Patient History Information obtained from Patient. Family History Diabetes - Siblings, Stroke - Siblings, Thyroid Problems - Siblings, No family history of Cancer, Heart Disease, Hereditary Spherocytosis, Hypertension, Kidney Disease, Lung Disease, Seizures, Tuberculosis. Social History Former smoker - .6580 packs/day for 15 years - ended on 07/16/2004, Marital Status - Married, Alcohol Use - Never, Drug Use - Prior History, Caffeine Use - Daily. Medical History Eyes Denies history of Cataracts,  Glaucoma, Optic Neuritis Ear/Nose/Mouth/Throat Patient has history of Chronic sinus problems/congestion - allergic rhinitis Hematologic/Lymphatic Patient has history of Lymphedema Respiratory Patient has history of Asthma, Sleep Apnea Gastrointestinal Patient has history of Hepatitis B, Hepatitis C Endocrine Patient has history of Type II Diabetes Integumentary (Skin) Denies history of History of Burn Musculoskeletal Patient has history of Gout, Osteoarthritis, 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 Neurologic Patient has history of Neuropathy Hospitalization/Surgery History - amputation R second toe. - total knee arthroplasty. - abdominal surgery - hernia repair x6. - back surgery - rods and screws in lumbar area. - colonoscopy. - esophagogastroduodenoscopy. - joint replacement. Medical A Surgical History Notes nd Respiratory restrictive lung disease secondary to obesity Gastrointestinal exposure to hep B and C, Barrett's esophagus Musculoskeletal lumbar radiculopathy, DJD, amputation of second toe R Psychiatric depression Objective Constitutional No acute distress. Vitals Time Taken: 3:06 PM, Height: 72 in, Weight: 327 lbs, BMI: 44.3, Temperature: 97.8 F, Pulse: 73 bpm, Respiratory Rate: 18 breaths/min, Blood Pressure: 112/69 mmHg. Respiratory Normal work of breathing on room air. General Notes: 12/06/2021: His toe wound at the tip looks about the same with continued bone exposure; the wound on the plantar surface of the same toe has nearly closed with just a pinpoint opening and some callus buildup. Integumentary (Hair, Skin) Wound #1 status is Open. Original cause of wound was Gradually Appeared. The date acquired was: 02/13/2021. The wound has been in treatment 5 weeks. The wound is located on the Left,Plantar T Third. The wound measures 0.6cm length x 0.6cm width x 0.2cm depth; 0.283cm^2 area and 0.057cm^3 volume. There oe is bone and Fat Layer (Subcutaneous Tissue)  exposed. There is no tunneling noted, however, there is undermining starting at 12:00 and ending at 12:00 with a maximum distance of 0.3cm. There is a medium amount of serosanguineous drainage noted. The wound margin is well defined and not attached to the wound base. There is large (67-100%) pink granulation within the wound bed. There is a small (1-33%) amount of necrotic tissue within the wound bed including Adherent Slough. Wound #2 status is Open. Original cause of wound was Blister. The date acquired was: 11/22/2021. The wound has been in treatment 2 weeks. The wound is located on the Left,Distal,Plantar T Third. The wound measures 0.1cm length x 0.1cm width x 0.1cm depth; 0.008cm^2 area and 0.001cm^3 volume. There is oe no tunneling or undermining noted. There is a small amount of serosanguineous drainage noted. The wound margin is flat and intact. There is no granulation within the wound bed. There is no necrotic tissue within the wound bed. Assessment Active  Problems ICD-10 Non-pressure chronic ulcer of other part of left foot with fat layer exposed Type 2 diabetes mellitus with foot ulcer Venous insufficiency (chronic) (peripheral) Morbid (severe) obesity due to excess calories Type 2 diabetes mellitus with unspecified complications Essential (primary) hypertension Procedures Wound #1 Pre-procedure diagnosis of Wound #1 is a Diabetic Wound/Ulcer of the Lower Extremity located on the Left,Plantar T Third .Severity of Tissue Pre oe Debridement is: Necrosis of bone. There was a Excisional Skin/Subcutaneous Tissue Debridement with a total area of 0.36 sq cm performed by Duanne Guess, MD. With the following instrument(s): Curette to remove Viable and Non-Viable tissue/material. Material removed includes Callus and Subcutaneous Tissue and after achieving pain control using Lidocaine 4% T opical Solution. No specimens were taken. A time out was conducted at 15:50, prior to the start  of the procedure. A Minimum amount of bleeding was controlled with Pressure. The procedure was tolerated well with a pain level of 0 throughout and a pain level of 0 following the procedure. Post Debridement Measurements: 0.6cm length x 0.6cm width x 0.2cm depth; 0.057cm^3 volume. Character of Wound/Ulcer Post Debridement is improved. Severity of Tissue Post Debridement is: Necrosis of bone. Post procedure Diagnosis Wound #1: Same as Pre-Procedure Wound #2 Pre-procedure diagnosis of Wound #2 is a Diabetic Wound/Ulcer of the Lower Extremity located on the Left,Distal,Plantar T Third .Severity of Tissue Pre oe Debridement is: Fat layer exposed. There was a Selective/Open Wound Non-Viable Tissue Debridement with a total area of 0.09 sq cm performed by Duanne Guess, MD. With the following instrument(s): Curette to remove Non-Viable tissue/material. Material removed includes Eschar after achieving pain control using Lidocaine 4% T opical Solution. No specimens were taken. A time out was conducted at 15:50, prior to the start of the procedure. A Minimum amount of bleeding was controlled with Pressure. The procedure was tolerated well with a pain level of 0 throughout and a pain level of 0 following the procedure. Post Debridement Measurements: 0.1cm length x 0.1cm width x 0.1cm depth; 0.001cm^3 volume. Character of Wound/Ulcer Post Debridement is improved. Severity of Tissue Post Debridement is: Fat layer exposed. Post procedure Diagnosis Wound #2: Same as Pre-Procedure Plan Follow-up Appointments: Return Appointment in 2 weeks. - Dr. Lady Gary RM 1 Wed 6/7 @ 2:00 pm Bathing/ Shower/ Hygiene: May shower and wash wound with soap and water. Edema Control - Lymphedema / SCD / Other: Elevate legs to the level of the heart or above for 30 minutes daily and/or when sitting, a frequency of: Avoid standing for long periods of time. Patient to wear own compression stockings every day. Exercise  regularly Off-Loading: Wedge shoe to: - front off-loading shoe to left foot when ambulating Consults ordered were: Podiatry - [facility] - diabetic foot ulcer left 3rd toe with acute osteomyelitis, probable toe amputation WOUND #1: - T Third Wound Laterality: Plantar, Left oe Cleanser: Wound Cleanser Every Other Day/30 Days Discharge Instructions: Cleanse the wound with wound cleanser prior to applying a clean dressing using gauze sponges, not tissue or cotton balls. Prim Dressing: Promogran Prisma Matrix, 4.34 (sq in) (silver collagen) Every Other Day/30 Days ary Discharge Instructions: Moisten collagen with saline or hydrogel Secondary Dressing: Optifoam Non-Adhesive Dressing, 4x4 in Every Other Day/30 Days Discharge Instructions: Apply over primary dressing cut to make foam doughnut Secondary Dressing: Woven Gauze Sponges 2x2 in Every Other Day/30 Days Discharge Instructions: Apply over primary dressing as directed. Secured With: Insurance underwriter, Sterile 2x75 (in/in) Every Other Day/30 Days Discharge Instructions: Secure  with stretch gauze as directed. WOUND #2: - T Third Wound Laterality: Plantar, Left, Distal oe Cleanser: Wound Cleanser Every Other Day/30 Days Discharge Instructions: Cleanse the wound with wound cleanser prior to applying a clean dressing using gauze sponges, not tissue or cotton balls. Prim Dressing: Promogran Prisma Matrix, 4.34 (sq in) (silver collagen) Every Other Day/30 Days ary Discharge Instructions: Moisten collagen with saline or hydrogel Secondary Dressing: Woven Gauze Sponges 2x2 in Every Other Day/30 Days Discharge Instructions: Apply over primary dressing as directed. Secured With: Insurance underwriter, Sterile 2x75 (in/in) Every Other Day/30 Days Discharge Instructions: Secure with stretch gauze as directed. 12/06/2021: His toe wound at the tip looks about the same with continued bone exposure; the wound on the  plantar surface of the same toe has nearly closed with just a pinpoint opening and some callus buildup. I did debride the callus and eschar from the plantar foot wound and some subcutaneous tissue from the distal toe wound. Unfortunately, his MRI was positive for acute osteomyelitis. I discussed the options for treatment with him including long-term IV antibiotics and continued local wound care. He said that he would prefer to just have it over and done with and would like a referral to podiatry for amputation. We will make that referral. In the interim, he can continue the silver collagen until he has his appointment with podiatry. Follow-up as needed. Electronic Signature(s) Signed: 12/06/2021 5:12:17 PM By: Duanne Guess MD FACS Entered By: Duanne Guess on 12/06/2021 17:12:17 -------------------------------------------------------------------------------- HxROS Details Patient Name: Date of Service: Robert Majestic D. 12/06/2021 2:45 PM Medical Record Number: [id number] Patient Account Number: [id number] Date of Birth/Sex: Treating RN: [birth year] (65 y.o. Robert [name] Primary Care Provider: PRA CTICE, PLEA SA NT Other Clinician: Referring Provider: Treating Provider/Extender: Duanne Guess PRA CTICE, PLEA SA NT Weeks in Treatment: 5 Information Obtained From Patient Eyes Medical History: Negative for: Cataracts; Glaucoma; Optic Neuritis Ear/Nose/Mouth/Throat Medical History: Positive for: Chronic sinus problems/congestion - allergic rhinitis Hematologic/Lymphatic Medical History: Positive for: Lymphedema Respiratory Medical History: Positive for: Asthma; Sleep Apnea Past Medical History Notes: restrictive lung disease secondary to obesity Gastrointestinal Medical History: Positive for: Hepatitis B; Hepatitis C Past Medical History Notes: exposure to hep B and C, Barrett's esophagus Endocrine Medical History: Positive for: Type II Diabetes Time with  diabetes: 2-3 years ago Treated with: Insulin Blood sugar tested every day: No Integumentary (Skin) Medical History: Negative for: History of Burn Musculoskeletal Medical History: Positive for: Gout; Osteoarthritis; 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 Past Medical History Notes: lumbar radiculopathy, DJD, amputation of second toe R Neurologic Medical History: Positive for: Neuropathy Psychiatric Medical History: Past Medical History Notes: depression HBO Extended History Items Ear/Nose/Mouth/Throat: Chronic sinus problems/congestion Immunizations Pneumococcal Vaccine: Received Pneumococcal Vaccination: No Implantable Devices None Hospitalization / Surgery History Type of Hospitalization/Surgery amputation R second toe total knee arthroplasty abdominal surgery - hernia repair x6 back surgery - rods and screws in lumbar area colonoscopy esophagogastroduodenoscopy joint replacement Family and Social History Cancer: No; Diabetes: Yes - Siblings; Heart Disease: No; Hereditary Spherocytosis: No; Hypertension: No; Kidney Disease: No; Lung Disease: No; Seizures: No; Stroke: Yes - Siblings; Thyroid Problems: Yes - Siblings; Tuberculosis: No; Former smoker - .34 packs/day for 15 years - ended on 07/16/2004; Marital Status - Married; Alcohol Use: Never; Drug Use: Prior History; Caffeine Use: Daily; Financial Concerns: No; Food, Clothing or Shelter Needs: No; Support System Lacking: No; Transportation Concerns: No Psychologist, prison and probation services) Signed: 12/06/2021 6:00:25 PM By: Duanne Guess MD FACS Signed:  12/07/2021 6:06:15 PM By: Zenaida Deed RN, BSN Entered By: Duanne Guess on 12/06/2021 17:07:19 -------------------------------------------------------------------------------- SuperBill Details Patient Name: Date of Service: Robert Majestic D. 12/06/2021 Medical Record Number: [id number] Patient Account Number: [id number] Date of Birth/Sex: Treating RN: 1955/09/14 (66 y.o. Robert [name] Primary Care Provider: PRA CTICE, PLEA SA NT Other Clinician: Referring Provider: Treating Provider/Extender: Duanne Guess PRA CTICE, PLEA SA NT Weeks in Treatment: 5 Diagnosis Coding ICD-10 Codes Code Description [phone] Non-pressure chronic ulcer of other part of left foot with fat layer exposed E11.621 Type 2 diabetes mellitus with foot ulcer I87.2 Venous insufficiency (chronic) (peripheral) E66.01 Morbid (severe) obesity due to excess calories E11.8 Type 2 diabetes mellitus with unspecified complications I10 Essential (primary) hypertension Facility Procedures CPT4 Code: [id number] Description: 11042 - DEB SUBQ TISSUE 20 SQ CM/< ICD-10 Diagnosis Description L97.522 Non-pressure chronic ulcer of other part of left foot with fat layer exposed Modifier: Quantity: 1 CPT4 Code: 62130865 9 I Description: 7597 - DEBRIDE WOUND 1ST 20 SQ CM OR < CD-10 Diagnosis Description L97.522 Non-pressure chronic ulcer of other part of left foot with fat layer exposed Modifier: 1 Quantity: Physician Procedures : CPT4 Code Description Modifier [id number] 99214 - WC PHYS LEVEL 4 - EST PT 25 ICD-10 Diagnosis Description L97.522 Non-pressure chronic ulcer of other part of left foot with fat layer exposed E11.621 Type 2 diabetes mellitus with foot ulcer I87.2 Venous  insufficiency (chronic) (peripheral) Quantity: 1 : [id number] 11042 - WC PHYS SUBQ TISS 20 SQ CM ICD-10 Diagnosis Description L97.522 Non-pressure chronic ulcer of other part of left foot with fat layer exposed Quantity: 1 : 2440102 97597 - WC PHYS DEBR WO ANESTH 20 SQ CM ICD-10 Diagnosis Description L97.522 Non-pressure chronic ulcer of other part of left foot with fat layer exposed Quantity: 1 Electronic Signature(s) Signed: 12/06/2021 5:12:52 PM By: Duanne Guess MD FACS Entered By: Duanne Guess on 12/06/2021 17:12:52

## 2021-12-07 NOTE — Progress Notes (Addendum)
[name], Taige D. ([id number]) Visit Report for 12/06/2021 Arrival Information Details Patient Name: Date of Service: HJALMAR, STUKES 12/06/2021 2:45 PM Medical Record Number: [id number] Patient Account Number: [id number] Date of Birth/Sex: Treating RN: 12/15/1955 (66 y.o. [name] Primary Care Tramel Westbrook: PRA CTICE, PLEA SA NT Other Clinician: Referring Kendelle Schweers: Treating Erline Siddoway/Extender: Fredirick Maudlin PRA CTICE, PLEA SA NT Weeks in Treatment: 5 Visit Information History Since Last Visit Added or deleted any medications: No Patient Arrived: Cane Any new allergies or adverse reactions: No Arrival Time: 15:05 Had a fall or experienced change in No Accompanied By: self activities of daily living that may affect Transfer Assistance: None risk of falls: Patient Identification Verified: Yes Signs or symptoms of abuse/neglect since last visito No Secondary Verification Process Completed: Yes Hospitalized since last visit: No Patient Requires Transmission-Based Precautions: No Implantable device outside of the clinic excluding No Patient Has Alerts: No cellular tissue based products placed in the center since last visit: Has Dressing in Place as Prescribed: Yes Pain Present Now: Yes Electronic Signature(s) Signed: 12/07/2021 6:06:15 PM By: Baruch Gouty RN, BSN Entered By: Baruch Gouty on 12/06/2021 15:05:28 -------------------------------------------------------------------------------- Encounter Discharge Information Details Patient Name: Date of Service: Roxanne Gates D. 12/06/2021 2:45 PM Medical Record Number: [id number] Patient Account Number: [id number] Date of Birth/Sex: Treating RN: May 02, 1956 (65 y.o. [name] Primary Care Kelita Wallis: PRA CTICE, PLEA SA NT Other Clinician: Referring Tajay Muzzy: Treating Sherrika Weakland/Extender: Fredirick Maudlin PRA CTICE, PLEA SA NT Weeks in Treatment: 5 Encounter Discharge Information Items Post Procedure  Vitals Discharge Condition: Stable Temperature (F): 97.8 Ambulatory Status: Ambulatory Pulse (bpm): 73 Discharge Destination: Home Respiratory Rate (breaths/min): 18 Transportation: Private Auto Blood Pressure (mmHg): 112/69 Accompanied By: self Schedule Follow-up Appointment: Yes Clinical Summary of Care: Patient Declined Electronic Signature(s) Signed: 12/07/2021 6:06:15 PM By: Baruch Gouty RN, BSN Entered By: Baruch Gouty on 12/06/2021 16:09:24 -------------------------------------------------------------------------------- Lower Extremity Assessment Details Patient Name: Date of Service: Roxanne Gates D. 12/06/2021 2:45 PM Medical Record Number: [id number] Patient Account Number: [id number] Date of Birth/Sex: Treating RN: Mar 04, 1956 (66 y.o. [name] Primary Care Mckinlee Dunk: PRA CTICE, PLEA SA NT Other Clinician: Referring Andrika Peraza: Treating Chaska Hagger/Extender: Fredirick Maudlin PRA CTICE, PLEA SA NT Weeks in Treatment: 5 Edema Assessment Assessed: [Left: No] [Right: No] Edema: [Left: Ye] [Right: s] Calf Left: Right: Point of Measurement: From Medial Instep 48 cm Ankle Left: Right: Point of Measurement: From Medial Instep 29.5 cm Vascular Assessment Pulses: Dorsalis Pedis Palpable: [Left:Yes] Electronic Signature(s) Signed: 12/07/2021 6:06:15 PM By: Baruch Gouty RN, BSN Entered By: Baruch Gouty on 12/06/2021 15:40:22 -------------------------------------------------------------------------------- Multi Wound Chart Details Patient Name: Date of Service: Roxanne Gates D. 12/06/2021 2:45 PM Medical Record Number: [id number] Patient Account Number: [id number] Date of Birth/Sex: Treating RN: 10-10-1955 (66 y.o. [name] Primary Care Carmine Carrozza: PRA CTICE, PLEA SA NT Other Clinician: Referring Kelee Cunningham: Treating Malayja Freund/Extender: Fredirick Maudlin PRA CTICE, PLEA SA NT Weeks in Treatment: 5 Vital Signs Height(in): 72 Pulse(bpm):  68 Weight(lbs): [id number] Blood Pressure(mmHg): 112/69 Body Mass Index(BMI): 44.3 Temperature(F): 97.8 Respiratory Rate(breaths/min): 18 Photos: [1:Left, Plantar T Third oe] [2:Left, Distal, Plantar T Third] [N/A:N/A oe N/A] Wound Location: [1:Gradually Appeared] [2:Blister] [N/A:N/A] Wounding Event: [1:Diabetic Wound/Ulcer of the Lower] [2:Diabetic Wound/Ulcer of the Lower] [N/A:N/A] Primary Etiology: [1:Extremity Chronic sinus problems/congestion, Chronic sinus problems/congestion,] [2:Extremity] [N/A:N/A] Comorbid History: [1:Lymphedema, Asthma, Sleep Apnea, Lymphedema, Asthma, Sleep Apnea, Hepatitis B, Hepatitis C, Type II Diabetes, Gout, Osteoarthritis, Osteomyelitis, Neuropathy 02/13/2021] [2:Hepatitis B, Hepatitis C, Type II  Diabetes, Gout,  Osteoarthritis, Osteomyelitis, Neuropathy 11/22/2021] [N/A:N/A] Date Acquired: [1:5] [2:2] [N/A:N/A] Weeks of Treatment: [1:Open] [2:Open] [N/A:N/A] Wound Status: [1:No] [2:No] [N/A:N/A] Wound Recurrence: [1:0.6x0.6x0.2] [2:0.1x0.1x0.1] [N/A:N/A] Measurements L x W x D (cm) [1:0.283] [2:0.008] [N/A:N/A] A (cm) : rea [1:0.057] [2:0.001] [N/A:N/A] Volume (cm) : [1:35.70%] [2:93.20%] [N/A:N/A] % Reduction in A [1:rea: 35.20%] [2:95.80%] [N/A:N/A] % Reduction in Volume: [1:12] Starting Position 1 (o'clock): [1:12] Ending Position 1 (o'clock): [1:0.3] Maximum Distance 1 (cm): [1:Yes] [2:No] [N/A:N/A] Undermining: [1:Grade 2] [2:Grade 1] [N/A:N/A] Classification: [1:Medium] [2:Small] [N/A:N/A] Exudate A mount: [1:Serosanguineous] [2:Serosanguineous] [N/A:N/A] Exudate Type: [1:red, brown] [2:red, brown] [N/A:N/A] Exudate Color: [1:Well defined, not attached] [2:Flat and Intact] [N/A:N/A] Wound Margin: [1:Large (67-100%)] [2:None Present (0%)] [N/A:N/A] Granulation A mount: [1:Pink] [2:N/A] [N/A:N/A] Granulation Quality: [1:Small (1-33%)] [2:None Present (0%)] [N/A:N/A] Necrotic A mount: [1:Fat Layer (Subcutaneous Tissue): Yes Fascia: No]  [N/A:N/A] Exposed Structures: [1:Bone: Yes Fascia: No Tendon: No Muscle: No Joint: No None] [2:Fat Layer (Subcutaneous Tissue): No Tendon: No Muscle: No Joint: No Bone: No Large (67-100%)] [N/A:N/A] Epithelialization: [1:Debridement - Excisional] [2:Debridement - Selective/Open Wound] [N/A:N/A] Debridement: Pre-procedure Verification/Time Out 15:50 [2:15:50] [N/A:N/A] Taken: [1:Lidocaine 4% Topical Solution] [2:Lidocaine 4% Topical Solution] [N/A:N/A] Pain Control: [1:Callus, Subcutaneous] [2:Necrotic/Eschar] [N/A:N/A] Tissue Debrided: [1:Skin/Subcutaneous Tissue] [2:Non-Viable Tissue] [N/A:N/A] Level: [1:0.36] [2:0.09] [N/A:N/A] Debridement A (sq cm): [1:rea Curette] [2:Curette] [N/A:N/A] Instrument: [1:Minimum] [2:Minimum] [N/A:N/A] Bleeding: [1:Pressure] [2:Pressure] [N/A:N/A] Hemostasis A chieved: [1:0] [2:0] [N/A:N/A] Procedural Pain: [1:0] [2:0] [N/A:N/A] Post Procedural Pain: [1:Procedure was tolerated well] [2:Procedure was tolerated well] [N/A:N/A] Debridement Treatment Response: [1:0.6x0.6x0.2] [2:0.1x0.1x0.1] [N/A:N/A] Post Debridement Measurements L x W x D (cm) [1:0.057] [2:0.001] [N/A:N/A] Post Debridement Volume: (cm) [1:Debridement] [2:Debridement] [N/A:N/A] Treatment Notes Wound #1 (Toe Third) Wound Laterality: Plantar, Left Cleanser Wound Cleanser Discharge Instruction: Cleanse the wound with wound cleanser prior to applying a clean dressing using gauze sponges, not tissue or cotton balls. Peri-Wound Care Topical Primary Dressing Promogran Prisma Matrix, 4.34 (sq in) (silver collagen) Discharge Instruction: Moisten collagen with saline or hydrogel Secondary Dressing Optifoam Non-Adhesive Dressing, 4x4 in Discharge Instruction: Apply over primary dressing cut to make foam doughnut Woven Gauze Sponges 2x2 in Discharge Instruction: Apply over primary dressing as directed. Secured With Conforming Stretch Gauze Bandage, Sterile 2x75 (in/in) Discharge Instruction:  Secure with stretch gauze as directed. Compression Wrap Compression Stockings Add-Ons Wound #2 (Toe Third) Wound Laterality: Plantar, Left, Distal Cleanser Wound Cleanser Discharge Instruction: Cleanse the wound with wound cleanser prior to applying a clean dressing using gauze sponges, not tissue or cotton balls. Peri-Wound Care Topical Primary Dressing Promogran Prisma Matrix, 4.34 (sq in) (silver collagen) Discharge Instruction: Moisten collagen with saline or hydrogel Secondary Dressing Woven Gauze Sponges 2x2 in Discharge Instruction: Apply over primary dressing as directed. Secured With Conforming Stretch Gauze Bandage, Sterile 2x75 (in/in) Discharge Instruction: Secure with stretch gauze as directed. Compression Wrap Compression Stockings Add-Ons Electronic Signature(s) Signed: 12/06/2021 4:32:41 PM By: Fredirick Maudlin MD FACS Signed: 12/07/2021 6:06:15 PM By: Baruch Gouty RN, BSN Entered By: Fredirick Maudlin on 12/06/2021 16:32:41 -------------------------------------------------------------------------------- Multi-Disciplinary Care Plan Details Patient Name: Date of Service: Roxanne Gates D. 12/06/2021 2:45 PM Medical Record Number: [id number] Patient Account Number: [id number] Date of Birth/Sex: Treating RN: December 14, 1955 (66 y.o. [name] Primary Care [place] Alfredo: PRA CTICE, PLEA SA NT Other Clinician: Referring Jovanne Riggenbach: Treating Dominic Rhome/Extender: Fredirick Maudlin PRA CTICE, PLEA SA NT Weeks in Treatment: 5 Multidisciplinary Care Plan reviewed with physician Active Inactive Electronic Signature(s) Signed: 02/20/2022 6:14:46 PM By: Baruch Gouty RN, BSN Previous Signature: 12/07/2021 6:06:15 PM  Version By: Baruch Gouty RN, BSN Entered By: Baruch Gouty on 02/20/2022 10:46:50 -------------------------------------------------------------------------------- Pain Assessment Details Patient Name: Date of Service: ANDERSEN, PINKUS D. 12/06/2021 2:45  PM Medical Record Number: [id number] Patient Account Number: [id number] Date of Birth/Sex: Treating RN: 05/01/1956 (66 y.o. [name] Primary Care Gabi Mcfate: PRA CTICE, PLEA SA NT Other Clinician: Referring Dajon Rowe: Treating Kiah Keay/Extender: Fredirick Maudlin PRA CTICE, PLEA SA NT Weeks in Treatment: 5 Active Problems Location of Pain Severity and Description of Pain Patient Has Paino Yes Site Locations Pain Location: Pain in Ulcers With Dressing Change: Yes Duration of the Pain. Constant / Intermittento Intermittent Rate the pain. Current Pain Level: 3 Worst Pain Level: 4 Least Pain Level: 0 Character of Pain Describe the Pain: Aching Pain Management and Medication Current Pain Management: Rest: Yes Is the Current Pain Management Adequate: Adequate How does your wound impact your activities of daily livingo Sleep: No Bathing: No Appetite: No Relationship With Others: No Bladder Continence: No Emotions: No Bowel Continence: No Work: No Toileting: No Drive: No Dressing: No Hobbies: No Engineer, maintenance) Signed: 12/07/2021 6:06:15 PM By: Baruch Gouty RN, BSN Entered By: Baruch Gouty on 12/06/2021 15:39:12 -------------------------------------------------------------------------------- Patient/Caregiver Education Details Patient Name: Date of Service: Roxanne Gates D. 5/24/2023andnbsp2:45 PM Medical Record Number: [id number] Patient Account Number: [id number] Date of Birth/Gender: Treating RN: Dec 02, 1955 (66 y.o. [name] Primary Care Physician: PRA CTICE, PLEA SA NT Other Clinician: Referring Physician: Treating Physician/Extender: Fredirick Maudlin PRA CTICE, PLEA SA NT Weeks in Treatment: 5 Education Assessment Education Provided To: Patient Education Topics Provided Elevated Blood Sugar/ Impact on Healing: Methods: Explain/Verbal Responses: Reinforcements needed, State content correctly Infection: Methods:  Explain/Verbal Responses: Reinforcements needed, State content correctly Offloading: Methods: Explain/Verbal Responses: Reinforcements needed, State content correctly Wound/Skin Impairment: Methods: Explain/Verbal Responses: Reinforcements needed, State content correctly Electronic Signature(s) Signed: 12/07/2021 6:06:15 PM By: Baruch Gouty RN, BSN Entered By: Baruch Gouty on 12/06/2021 15:46:48 -------------------------------------------------------------------------------- Wound Assessment Details Patient Name: Date of Service: Roxanne Gates D. 12/06/2021 2:45 PM Medical Record Number: [id number] Patient Account Number: [id number] Date of Birth/Sex: Treating RN: 1955/10/22 (66 y.o. [name] Primary Care Jerrine Urschel: PRA CTICE, PLEA SA NT Other Clinician: Referring Katyana Trolinger: Treating Tripp Goins/Extender: Fredirick Maudlin PRA CTICE, PLEA SA NT Weeks in Treatment: 5 Wound Status Wound Number: 1 Primary Diabetic Wound/Ulcer of the Lower Extremity Etiology: Wound Location: Left, Plantar T Third oe Wound Open Wounding Event: Gradually Appeared Status: Date Acquired: 02/13/2021 Comorbid Chronic sinus problems/congestion, Lymphedema, Asthma, Sleep Weeks Of Treatment: 5 History: Apnea, Hepatitis B, Hepatitis C, Type II Diabetes, Gout, Clustered Wound: No Osteoarthritis, Osteomyelitis, Neuropathy Photos Wound Measurements Length: (cm) 0.6 Width: (cm) 0.6 Depth: (cm) 0.2 Area: (cm) 0.283 Volume: (cm) 0.057 % Reduction in Area: 35.7% % Reduction in Volume: 35.2% Epithelialization: None Tunneling: No Undermining: Yes Starting Position (o'clock): 12 Ending Position (o'clock): 12 Maximum Distance: (cm) 0.3 Wound Description Classification: Grade 2 Wound Margin: Well defined, not attached Exudate Amount: Medium Exudate Type: Serosanguineous Exudate Color: red, brown Foul Odor After Cleansing: No Slough/Fibrino No Wound Bed Granulation Amount: Large (67-100%)  Exposed Structure Granulation Quality: Pink Fascia Exposed: No Necrotic Amount: Small (1-33%) Fat Layer (Subcutaneous Tissue) Exposed: Yes Necrotic Quality: Adherent Slough Tendon Exposed: No Muscle Exposed: No Joint Exposed: No Bone Exposed: Yes Electronic Signature(s) Signed: 12/07/2021 6:06:15 PM By: Baruch Gouty RN, BSN Entered By: Baruch Gouty on 12/06/2021 15:45:03 -------------------------------------------------------------------------------- Wound Assessment Details Patient Name: Date of Service: Roxanne Gates D. 12/06/2021 2:45 PM  Medical Record Number: [id number] Patient Account Number: [id number] Date of Birth/Sex: Treating RN: [birth year] (66 y.o. [name] Primary Care Ved Martos: PRA CTICE, PLEA SA NT Other Clinician: Referring Bellarose Burtt: Treating Lorrayne Ismael/Extender: Fredirick Maudlin PRA CTICE, PLEA SA NT Weeks in Treatment: 5 Wound Status Wound Number: 2 Primary Diabetic Wound/Ulcer of the Lower Extremity Etiology: Wound Location: Left, Distal, Plantar T Third oe Wound Open Wounding Event: Blister Status: Date Acquired: 11/22/2021 Comorbid Chronic sinus problems/congestion, Lymphedema, Asthma, Sleep Weeks Of Treatment: 2 History: Apnea, Hepatitis B, Hepatitis C, Type II Diabetes, Gout, Clustered Wound: No Osteoarthritis, Osteomyelitis, Neuropathy Photos Wound Measurements Length: (cm) 0.1 Width: (cm) 0.1 Depth: (cm) 0.1 Area: (cm) 0.008 Volume: (cm) 0.001 % Reduction in Area: 93.2% % Reduction in Volume: 95.8% Epithelialization: Large (67-100%) Tunneling: No Undermining: No Wound Description Classification: Grade 1 Wound Margin: Flat and Intact Exudate Amount: Small Exudate Type: Serosanguineous Exudate Color: red, brown Wound Bed Granulation Amount: None Present (0%) Necrotic Amount: None Present (0%) Foul Odor After Cleansing: No Slough/Fibrino No Exposed Structure Fascia Exposed: No Fat Layer (Subcutaneous Tissue) Exposed:  No Tendon Exposed: No Muscle Exposed: No Joint Exposed: No Bone Exposed: No Electronic Signature(s) Signed: 12/07/2021 6:06:15 PM By: Baruch Gouty RN, BSN Entered By: Baruch Gouty on 12/06/2021 15:52:06 -------------------------------------------------------------------------------- Vitals Details Patient Name: Date of Service: Roxanne Gates D. 12/06/2021 2:45 PM Medical Record Number: [id number] Patient Account Number: [id number] Date of Birth/Sex: Treating RN: [birth year] (66 y.o. [name] Primary Care Perkins Molina: PRA CTICE, PLEA SA NT Other Clinician: Referring Keryl Gholson: Treating Amay Mijangos/Extender: Fredirick Maudlin PRA CTICE, PLEA SA NT Weeks in Treatment: 5 Vital Signs Time Taken: 15:06 Temperature (F): 97.8 Height (in): 72 Pulse (bpm): 73 Weight (lbs): 327 Respiratory Rate (breaths/min): 18 Body Mass Index (BMI): 44.3 Blood Pressure (mmHg): 112/69 Reference Range: 80 - 120 mg / dl Electronic Signature(s) Signed: 12/07/2021 6:06:15 PM By: Baruch Gouty RN, BSN Entered By: Baruch Gouty on 12/06/2021 15:08:10

## 2021-12-13 ENCOUNTER — Ambulatory Visit: Payer: Medicare Other | Admitting: Podiatry

## 2021-12-19 ENCOUNTER — Ambulatory Visit: Payer: Medicare Other | Admitting: Podiatry

## 2021-12-19 ENCOUNTER — Telehealth: Payer: Self-pay | Admitting: Urology

## 2021-12-19 DIAGNOSIS — M7671 Peroneal tendinitis, right leg: Secondary | ICD-10-CM

## 2021-12-19 DIAGNOSIS — L97524 Non-pressure chronic ulcer of other part of left foot with necrosis of bone: Secondary | ICD-10-CM | POA: Diagnosis not present

## 2021-12-19 DIAGNOSIS — E118 Type 2 diabetes mellitus with unspecified complications: Secondary | ICD-10-CM

## 2021-12-19 MED ORDER — DOXYCYCLINE HYCLATE 100 MG PO TABS: 100.0000 mg | ORAL_TABLET | Freq: Two times a day (BID) | ORAL | 0 refills | Status: AC

## 2021-12-19 NOTE — Progress Notes (Signed)
cul

## 2021-12-19 NOTE — Telephone Encounter (Signed)
DOS - 12/26/21  AMPUTATION 3RD LEFT --- 44920  [employer] EFFECTIVE DATE - 09/13/21  PLAN DEDUCTIBLE - $0.00 OUT OF POCKET - $3,600.00 W/ $1,007.12 REMAINING COINSURANCE - 0% COPAY - $295.00   PER [employer] WEBSITE FOR CPT CODE 19758 Notification or Prior Authorization is not required for the requested services  Decision ID #:[id number]

## 2021-12-19 NOTE — Progress Notes (Signed)
Subjective:  Patient ID: Robert [name], male    DOB: March 15, 1956,   MRN: [id number]  Chief Complaint  Patient presents with   Surgery Consultation    Surgery Consult for left 3rd toe amputation.     66 y.o. male presents for follow up of left third toe ulcer. Sent here from wound care to discuss new findings for osteomyelitis and surgical consult. Patient relates it was just not getting better. Has been dressing daily and not currently on antibiotics.Denies any pain. Relates improvement of wounds.  . Denies any other pedal complaints. Denies n/v/f/c.   Past Medical History:  Diagnosis Date   Allergic rhinitis due to pollen    Arthritis    Asthma    Back pain    Barrett esophagus    Depressive disorder    Diabetes mellitus type 2 in obese (HCC)    Exposure to hepatitis B    Exposure to hepatitis C    Narcotic abuse (HCC)    pain medications   OSA (obstructive sleep apnea)    on CPAP     Objective:  Physical Exam: Vascular: DP/PT pulses 2/4 bilateral. CFT <3 seconds. Normal hair growth on digits. No edema.  Skin. No lacerations or abrasions bilateral feet. Left hallux medial border mild erythema but improved.  Left third digit with granular wound measuirng 0.3 x 0.2 x 0.2 cm with hypekeratosis overlying. Mild erythema and edema noted to toe  No purulence and positive probe to bone.  Musculoskeletal: MMT 5/5 bilateral lower extremities in DF, PF, Inversion and Eversion. Deceased ROM in DF of ankle joint.  Neurological: Sensation intact to light touch.   MRI right foot:  IMPRESSION: 1. Erosion of the tuft of the third toe distal phalanx with associated signal changes within the residual bone, compatible with acute osteomyelitis. 2. Cellulitis of the third toe. No organized or rim enhancing fluid collections. 3. 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 Well-circumscribed marginal erosion along the medial aspect of the first metatarsal head suggest  sequela of crystalline arthropathy such as gout.     Assessment:   1. 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 Type 2 diabetes mellitus with complication, without long-term current use of insulin (HCC)   3. Peroneal tendonitis of right lower leg        Plan:  Patient was evaluated and treated and all questions answered. Ulcer right third digit limited to breakdown of skin.  -Discussed need for amputation of the third digit. Discussed MRI results with patient and recommend partial third digit amputation. Patient is in agreement with this plan  -Informed surgical risk consent was reviewed and read aloud to the patient.  I reviewed the films.  I have discussed my findings with the patient in great detail.  I have discussed all risks including but not limited to infection, stiffness, scarring, limp, disability, deformity, damage to blood vessels and nerves, numbness, poor healing, need for braces, arthritis, chronic pain, amputation, death.  All benefits and realistic expectations discussed in great detail.  I have made no promises as to the outcome.  I have provided realistic expectations.  I have offered the patient a 2nd opinion, which they have declined and assured me they preferred to proceed despite the risks. -Dressed with neosporin, DSD. -Continue with dressing.  -Good pulses noted and adequate blood flow around wound noted.  -Off-loading with surgical shoe. -Sent in  doxycyline.  -Discussed glucose  control and proper protein-rich diet.  -Left great ingrown toenail appears improved.  -Discussed if any worsening redness, pain, fever or chills to call or may need to report to the emergency room. Patient expressed understanding.  Will follow-up  likely 6/13 for surgery.     No follow-ups on file.   Louann Sjogren, DPM

## 2021-12-20 ENCOUNTER — Ambulatory Visit
Admission: RE | Admit: 2021-12-20 | Discharge: 2021-12-20 | Disposition: A | Discharge status: Home / Self Care | Payer: Medicare Other | Source: Ambulatory Visit | Attending: Nurse Practitioner | Admitting: Nurse Practitioner

## 2021-12-20 ENCOUNTER — Encounter (HOSPITAL_BASED_OUTPATIENT_CLINIC_OR_DEPARTMENT_OTHER): Payer: Medicare Other | Admitting: General Surgery

## 2021-12-20 ENCOUNTER — Other Ambulatory Visit: Payer: Self-pay | Admitting: Nurse Practitioner

## 2021-12-20 DIAGNOSIS — M25561 Pain in right knee: Secondary | ICD-10-CM

## 2021-12-22 LAB — WOUND CULTURE
MICRO NUMBER:: 13490483
SPECIMEN QUALITY:: ADEQUATE

## 2021-12-25 ENCOUNTER — Ambulatory Visit: Payer: Medicare Other | Admitting: Podiatry

## 2021-12-26 ENCOUNTER — Other Ambulatory Visit: Payer: Self-pay | Admitting: Podiatry

## 2021-12-26 ENCOUNTER — Encounter: Payer: Self-pay | Admitting: Podiatry

## 2021-12-26 DIAGNOSIS — L97524 Non-pressure chronic ulcer of other part of left foot with necrosis of bone: Secondary | ICD-10-CM | POA: Diagnosis not present

## 2021-12-26 MED ORDER — CEPHALEXIN 500 MG PO CAPS: 500.0000 mg | ORAL_CAPSULE | Freq: Four times a day (QID) | ORAL | 0 refills | Status: AC

## 2021-12-26 MED ORDER — OXYCODONE-ACETAMINOPHEN 5-325 MG PO TABS
1.0000 | ORAL_TABLET | ORAL | 0 refills | Status: DC | PRN
Start: 2021-12-26 — End: 2021-12-28

## 2021-12-26 MED ORDER — ONDANSETRON HCL 4 MG PO TABS: 4.0000 mg | ORAL_TABLET | Freq: Three times a day (TID) | ORAL | 0 refills | Status: DC | PRN

## 2021-12-28 ENCOUNTER — Other Ambulatory Visit: Payer: Self-pay | Admitting: Podiatry

## 2021-12-28 ENCOUNTER — Telehealth: Payer: Self-pay | Admitting: *Deleted

## 2021-12-28 MED ORDER — OXYCODONE-ACETAMINOPHEN 5-325 MG PO TABS: 1.0000 | ORAL_TABLET | ORAL | 0 refills | Status: AC | PRN

## 2021-12-28 NOTE — Telephone Encounter (Signed)
Patient is calling for a pain medicine refill(10 more pills of the 5/325 to get him over the hump). Please advise.

## 2021-12-28 NOTE — Telephone Encounter (Signed)
Patient notified

## 2021-12-28 NOTE — Telephone Encounter (Signed)
Refill sent. Please notify  

## 2022-01-01 ENCOUNTER — Encounter: Payer: Self-pay | Admitting: Podiatry

## 2022-01-02 ENCOUNTER — Ambulatory Visit (INDEPENDENT_AMBULATORY_CARE_PROVIDER_SITE_OTHER): Payer: Medicare Other

## 2022-01-02 ENCOUNTER — Ambulatory Visit (INDEPENDENT_AMBULATORY_CARE_PROVIDER_SITE_OTHER): Payer: Medicare Other | Admitting: Podiatry

## 2022-01-02 ENCOUNTER — Encounter: Payer: Self-pay | Admitting: Podiatry

## 2022-01-02 DIAGNOSIS — Z9889 Other specified postprocedural states: Secondary | ICD-10-CM

## 2022-01-02 NOTE — Progress Notes (Signed)
Subjective:  Patient ID: Robert [name], male    DOB: 1956-04-28,  MRN: [id number]  No chief complaint on file.   DOS: 12/26/21 Procedure: Left third partial digit amputation  66 y.o. male returns for POV#1. Relates doing well and pain is controlled.   Review of Systems: Negative except as noted in the HPI. Denies N/V/F/Ch.  Past Medical History:  Diagnosis Date   Allergic rhinitis due to pollen    Arthritis    Asthma    Back pain    Barrett esophagus    Depressive disorder    Diabetes mellitus type 2 in obese (HCC)    Exposure to hepatitis B    Exposure to hepatitis C    Narcotic abuse (HCC)    pain medications   OSA (obstructive sleep apnea)    on CPAP     Current Outpatient Medications:    albuterol (VENTOLIN HFA) 108 (90 Base) MCG/ACT inhaler, INHALE 1-2 PUFFS BY MOUTH EVERY 6 HOURS AS NEEDED FOR WHEEZE OR SHORTNESS OF BREATH., Disp: 8.5 each, Rfl: 0   amLODipine-olmesartan (AZOR) 5-40 MG tablet, Take 1 tablet by mouth daily., Disp: , Rfl:    APPLE CIDER VINEGAR PO, Take 15 mLs by mouth 2 (two) times daily. , Disp: , Rfl:    B Complex Vitamins (B COMPLEX 100 PO), Take 1 capsule by mouth every other day. , Disp: , Rfl:    blood glucose meter kit and supplies, Dispense based on patient and insurance preference. Use to check glucose fasting in the AM and then after largest meal of the day.. (FOR ICD-10 E10.9, E11.9)., Disp: 1 each, Rfl: 0   Blood Glucose Monitoring Suppl (ONE TOUCH ULTRA MINI) w/Device KIT, Use to test blood sugar, test in the morning fasting and test after largest meal of the day., Disp: 1 each, Rfl: 0   CALCIUM-MAGNESIUM-ZINC PO, Take 1 tablet by mouth every other day., Disp: , Rfl:    cephALEXin (KEFLEX) 500 MG capsule, Take 1 capsule (500 mg total) by mouth 4 (four) times daily for 7 days., Disp: 28 capsule, Rfl: 0   Cholecalciferol (VITAMIN D3) 5000 units TABS, 5,000 IU OTC vitamin D3 daily. (Patient taking differently: Take 5,000 Units by mouth daily.),  Disp: 90 tablet, Rfl: 3   Colchicine 0.6 MG CAPS, PLEASE SEE ATTACHED FOR DETAILED DIRECTIONS, Disp: , Rfl:    cyclobenzaprine (FLEXERIL) 10 MG tablet, TAKE 1 TABLET BY MOUTH TWICE A DAY, Disp: 180 tablet, Rfl: 0   DANDELION PO, Take 1 tablet by mouth every other day., Disp: , Rfl:    FARXIGA 10 MG TABS tablet, Take 10 mg by mouth daily., Disp: , Rfl:    glucose blood (ONE TOUCH ULTRA TEST) test strip, Use to test blood sugar, test in the morning fasting and test after largest meal of the day., Disp: 100 each, Rfl: 11   LAGEVRIO 200 MG CAPS capsule, SMARTSIG:4 Capsule(s) By Mouth Every 12 Hours, Disp: , Rfl:    meloxicam (MOBIC) 15 MG tablet, TAKE 1 TABLET BY MOUTH EVERY DAY, Disp: 90 tablet, Rfl: 0   mupirocin ointment (BACTROBAN) 2 %, Apply 1 application topically 2 (two) times daily., Disp: 30 g, Rfl: 2   NARCAN 4 MG/0.1ML LIQD nasal spray kit, 1 SPRAY AS NEEDED, Disp: , Rfl:    Olmesartan-amLODIPine-HCTZ 20-5-12.5 MG TABS, TAKE 1 TABLET BY MOUTH EVERY DAY, Disp: 90 tablet, Rfl: 0   ondansetron (ZOFRAN) 4 MG tablet, Take 1 tablet (4 mg total) by mouth every 8 (eight)  hours as needed for nausea or vomiting., Disp: 20 tablet, Rfl: 0   ONETOUCH DELICA LANCETS 92H MISC, Use for testing blood sugar, test once in the morning fasting and test after largest meal of the day., Disp: 100 each, Rfl: 11   oxyCODONE-acetaminophen (PERCOCET/ROXICET) 5-325 MG tablet, Take 1 tablet by mouth every 4 (four) hours as needed for up to 5 days for severe pain., Disp: 10 tablet, Rfl: 0   OZEMPIC, 0.25 OR 0.5 MG/DOSE, 2 MG/1.5ML SOPN, INJECT 0.25MG INTO SKIN ONCE WEEKLY FOR 4 WEEKS, THEN INJECT 0.5MG INTO SKIN ONCE WEEKLY., Disp: 1.5 mL, Rfl: 2   Potassium 99 MG TABS, Take 99 mg by mouth every other day. , Disp: , Rfl:    rosuvastatin (CRESTOR) 5 MG tablet, Take 5 mg by mouth daily., Disp: , Rfl:    traMADol (ULTRAM) 50 MG tablet, Take 2 tablets (100 mg total) by mouth every 6 (six) hours as needed. For pain, Disp: 30  tablet, Rfl:    TURMERIC CURCUMIN PO, Take 1 tablet by mouth every other day., Disp: , Rfl:    valACYclovir (VALTREX) 1000 MG tablet, TAKE 1 TABLET BY MOUTH DAILY AS NEEDED (FOR OUTBREAKS ONLY)., Disp: 5 tablet, Rfl: 0   vitamin B-12 1000 MCG tablet, Take 1 tablet (1,000 mcg total) by mouth daily., Disp: 30 tablet, Rfl: 0   vitamin C (ASCORBIC ACID) 500 MG tablet, Take 500 mg by mouth daily., Disp: , Rfl:    VITAMIN E PO, Take 1 tablet by mouth daily., Disp: , Rfl:   Social History   Tobacco Use  Smoking Status Former   Packs/day: 0.80   Years: 15.00   Total pack years: 12.00   Types: Cigarettes   Quit date: 07/16/2004   Years since quitting: 17.4  Smokeless Tobacco Never    No Known Allergies Objective:  There were no vitals filed for this visit. There is no height or weight on file to calculate BMI. Constitutional Well developed. Well nourished.  Vascular Foot warm and well perfused. Capillary refill normal to all digits.   Neurologic Normal speech. Oriented to person, place, and time. Epicritic sensation to light touch grossly present bilaterally.  Dermatologic Skin healing well without signs of infection. Skin edges well coapted without signs of infection.  Orthopedic: Tenderness to palpation noted about the surgical site.   Radiographs: Interval loss of partial left third digit.  Assessment:   1. Post-operative state    Plan:  Patient was evaluated and treated and all questions answered.  S/p foot surgery left -Progressing as expected post-operatively. -WB Status: WBAT in surgical shoe -Sutures: intact. -Medications: n/a -Foot redressed. Return in 2 weeks for suture removal.   No follow-ups on file.

## 2022-01-17 ENCOUNTER — Ambulatory Visit (INDEPENDENT_AMBULATORY_CARE_PROVIDER_SITE_OTHER): Payer: Medicare Other | Admitting: Podiatry

## 2022-01-17 DIAGNOSIS — L97521 Non-pressure chronic ulcer of other part of left foot limited to breakdown of skin: Secondary | ICD-10-CM

## 2022-01-17 DIAGNOSIS — E118 Type 2 diabetes mellitus with unspecified complications: Secondary | ICD-10-CM

## 2022-01-17 MED ORDER — DOXYCYCLINE HYCLATE 100 MG PO TABS: 100.0000 mg | ORAL_TABLET | Freq: Two times a day (BID) | ORAL | 0 refills | Status: DC

## 2022-01-23 ENCOUNTER — Other Ambulatory Visit: Payer: Self-pay | Admitting: Physician Assistant

## 2022-01-23 NOTE — Progress Notes (Signed)
Subjective:  Patient ID: Robert [name], male    DOB: 07/15/1956,  MRN: [id number]  Chief Complaint  Patient presents with   Routine Post Op    SUTURE REMOVAL POV #2 DOS 12/26/2021 LT 3RD DIGIT PARTIAL AMPUTATION     DOS: 12/26/21 Procedure: Left third partial digit amputation  66 y.o. male returns postop visit for left third digit amputation.  He states he is doing okay.  He is now concerned about the left second digit.  Patient states he started noticing opening of superficial wound and wanted get it evaluated before he gets worse.  The dark site is completely healed.  Review of Systems: Negative except as noted in the HPI. Denies N/V/F/Ch.  Past Medical History:  Diagnosis Date   Allergic rhinitis due to pollen    Arthritis    Asthma    Back pain    Barrett esophagus    Depressive disorder    Diabetes mellitus type 2 in obese (HCC)    Exposure to hepatitis B    Exposure to hepatitis C    Narcotic abuse (HCC)    pain medications   OSA (obstructive sleep apnea)    on CPAP     Current Outpatient Medications:    doxycycline (VIBRA-TABS) 100 MG tablet, Take 1 tablet (100 mg total) by mouth 2 (two) times daily., Disp: 28 tablet, Rfl: 0   albuterol (VENTOLIN HFA) 108 (90 Base) MCG/ACT inhaler, INHALE 1-2 PUFFS BY MOUTH EVERY 6 HOURS AS NEEDED FOR WHEEZE OR SHORTNESS OF BREATH., Disp: 8.5 each, Rfl: 0   amLODipine-olmesartan (AZOR) 5-40 MG tablet, Take 1 tablet by mouth daily., Disp: , Rfl:    APPLE CIDER VINEGAR PO, Take 15 mLs by mouth 2 (two) times daily. , Disp: , Rfl:    B Complex Vitamins (B COMPLEX 100 PO), Take 1 capsule by mouth every other day. , Disp: , Rfl:    blood glucose meter kit and supplies, Dispense based on patient and insurance preference. Use to check glucose fasting in the AM and then after largest meal of the day.. (FOR ICD-10 E10.9, E11.9)., Disp: 1 each, Rfl: 0   Blood Glucose Monitoring Suppl (ONE TOUCH ULTRA MINI) w/Device KIT, Use to test blood sugar,  test in the morning fasting and test after largest meal of the day., Disp: 1 each, Rfl: 0   CALCIUM-MAGNESIUM-ZINC PO, Take 1 tablet by mouth every other day., Disp: , Rfl:    Cholecalciferol (VITAMIN D3) 5000 units TABS, 5,000 IU OTC vitamin D3 daily. (Patient taking differently: Take 5,000 Units by mouth daily.), Disp: 90 tablet, Rfl: 3   Colchicine 0.6 MG CAPS, PLEASE SEE ATTACHED FOR DETAILED DIRECTIONS, Disp: , Rfl:    cyclobenzaprine (FLEXERIL) 10 MG tablet, TAKE 1 TABLET BY MOUTH TWICE A DAY, Disp: 180 tablet, Rfl: 0   DANDELION PO, Take 1 tablet by mouth every other day., Disp: , Rfl:    FARXIGA 10 MG TABS tablet, Take 10 mg by mouth daily., Disp: , Rfl:    glucose blood (ONE TOUCH ULTRA TEST) test strip, Use to test blood sugar, test in the morning fasting and test after largest meal of the day., Disp: 100 each, Rfl: 11   LAGEVRIO 200 MG CAPS capsule, SMARTSIG:4 Capsule(s) By Mouth Every 12 Hours, Disp: , Rfl:    meloxicam (MOBIC) 15 MG tablet, TAKE 1 TABLET BY MOUTH EVERY DAY, Disp: 90 tablet, Rfl: 0   mupirocin ointment (BACTROBAN) 2 %, Apply 1 application topically 2 (two) times   daily., Disp: 30 g, Rfl: 2   NARCAN 4 MG/0.1ML LIQD nasal spray kit, 1 SPRAY AS NEEDED, Disp: , Rfl:    Olmesartan-amLODIPine-HCTZ 20-5-12.5 MG TABS, TAKE 1 TABLET BY MOUTH EVERY DAY, Disp: 90 tablet, Rfl: 0   ondansetron (ZOFRAN) 4 MG tablet, Take 1 tablet (4 mg total) by mouth every 8 (eight) hours as needed for nausea or vomiting., Disp: 20 tablet, Rfl: 0   ONETOUCH DELICA LANCETS 33G MISC, Use for testing blood sugar, test once in the morning fasting and test after largest meal of the day., Disp: 100 each, Rfl: 11   OZEMPIC, 0.25 OR 0.5 MG/DOSE, 2 MG/1.5ML SOPN, INJECT 0.25MG INTO SKIN ONCE WEEKLY FOR 4 WEEKS, THEN INJECT 0.5MG INTO SKIN ONCE WEEKLY., Disp: 1.5 mL, Rfl: 2   Potassium 99 MG TABS, Take 99 mg by mouth every other day. , Disp: , Rfl:    rosuvastatin (CRESTOR) 5 MG tablet, Take 5 mg by mouth  daily., Disp: , Rfl:    traMADol (ULTRAM) 50 MG tablet, Take 2 tablets (100 mg total) by mouth every 6 (six) hours as needed. For pain, Disp: 30 tablet, Rfl:    TURMERIC CURCUMIN PO, Take 1 tablet by mouth every other day., Disp: , Rfl:    valACYclovir (VALTREX) 1000 MG tablet, TAKE 1 TABLET BY MOUTH DAILY AS NEEDED (FOR OUTBREAKS ONLY)., Disp: 5 tablet, Rfl: 0   vitamin B-12 1000 MCG tablet, Take 1 tablet (1,000 mcg total) by mouth daily., Disp: 30 tablet, Rfl: 0   vitamin C (ASCORBIC ACID) 500 MG tablet, Take 500 mg by mouth daily., Disp: , Rfl:    VITAMIN E PO, Take 1 tablet by mouth daily., Disp: , Rfl:   Social History   Tobacco Use  Smoking Status Former   Packs/day: 0.80   Years: 15.00   Total pack years: 12.00   Types: Cigarettes   Quit date: 07/16/2004   Years since quitting: 17.5  Smokeless Tobacco Never    No Known Allergies Objective:  There were no vitals filed for this visit. There is no height or weight on file to calculate BMI. Constitutional Well developed. Well nourished.  Vascular Foot warm and well perfused. Capillary refill normal to all digits.   Neurologic Normal speech. Oriented to person, place, and time. Epicritic sensation to light touch grossly present bilaterally.  Dermatologic Skin completely epithelialized over the third toe.  Superficial breakdown left second digit limited to the breakdown of the skin.  No purulent drainage noted no malodor present mild erythema noted.  Orthopedic: Mild tenderness to palpation noted about the second digit   Radiographs: Interval loss of partial left third digit.  Assessment:   1. Toe ulcer, left, limited to breakdown of skin (HCC)   2. Type 2 diabetes mellitus with complication, without long-term current use of insulin (HCC)     Plan:  Patient was evaluated and treated and all questions answered.  S/p foot surgery left -Has clinically healed and reepithelialized.  Left second digit toe ulceration limited  to the breakdown of the skin -Clinically at this time there is mild erythema present but no purulent drainage.  Patient is high risk of losing the second digit.  I discussed with patient he states understanding.  I will place him on doxycycline and have asked him to do a triple antibiotic and a Band-Aid to keep it covered.  If it continues to get worse to go to the emergency room right away he states understanding.  Return in about   2 weeks (around 01/31/2022).

## 2022-02-06 ENCOUNTER — Ambulatory Visit (INDEPENDENT_AMBULATORY_CARE_PROVIDER_SITE_OTHER): Payer: Medicare Other

## 2022-02-06 ENCOUNTER — Ambulatory Visit (INDEPENDENT_AMBULATORY_CARE_PROVIDER_SITE_OTHER): Payer: Medicare Other | Admitting: Podiatry

## 2022-02-06 ENCOUNTER — Encounter: Payer: Self-pay | Admitting: Podiatry

## 2022-02-06 DIAGNOSIS — Z9889 Other specified postprocedural states: Secondary | ICD-10-CM

## 2022-02-06 DIAGNOSIS — L97521 Non-pressure chronic ulcer of other part of left foot limited to breakdown of skin: Secondary | ICD-10-CM | POA: Diagnosis not present

## 2022-02-06 DIAGNOSIS — E118 Type 2 diabetes mellitus with unspecified complications: Secondary | ICD-10-CM

## 2022-02-06 NOTE — Progress Notes (Signed)
Subjective:  Patient ID: Robert [name], male    DOB: Dec 08, 1955,  MRN: [id number]  Chief Complaint  Patient presents with   Routine Post Op    POV3    DOS: 12/26/21 Procedure: Left third partial digit amputation  66 y.o. male returns postop visit for left third digit amputation 6 weeks out. This is doing ok and has healed. Since last visit had developed a wound on the second digit and seen Dr. Posey Pronto. Has been keeping it dressed and clean and taking doxycyline.    Review of Systems: Negative except as noted in the HPI. Denies N/V/F/Ch.  Past Medical History:  Diagnosis Date   Allergic rhinitis due to pollen    Arthritis    Asthma    Back pain    Barrett esophagus    Depressive disorder    Diabetes mellitus type 2 in obese (HCC)    Exposure to hepatitis B    Exposure to hepatitis C    Narcotic abuse (HCC)    pain medications   OSA (obstructive sleep apnea)    on CPAP     Current Outpatient Medications:    traMADol (ULTRAM) 50 MG tablet, 2 tablets Orally up to four times daily for 30 days, Disp: , Rfl:    albuterol (VENTOLIN HFA) 108 (90 Base) MCG/ACT inhaler, INHALE 1-2 PUFFS BY MOUTH EVERY 6 HOURS AS NEEDED FOR WHEEZE OR SHORTNESS OF BREATH., Disp: 8.5 each, Rfl: 0   amLODipine-olmesartan (AZOR) 5-40 MG tablet, Take 1 tablet by mouth daily., Disp: , Rfl:    APPLE CIDER VINEGAR PO, Take 15 mLs by mouth 2 (two) times daily. , Disp: , Rfl:    B Complex Vitamins (B COMPLEX 100 PO), Take 1 capsule by mouth every other day. , Disp: , Rfl:    blood glucose meter kit and supplies, Dispense based on patient and insurance preference. Use to check glucose fasting in the AM and then after largest meal of the day.. (FOR ICD-10 E10.9, E11.9)., Disp: 1 each, Rfl: 0   Blood Glucose Monitoring Suppl (ONE TOUCH ULTRA MINI) w/Device KIT, Use to test blood sugar, test in the morning fasting and test after largest meal of the day., Disp: 1 each, Rfl: 0   CALCIUM-MAGNESIUM-ZINC PO, Take 1 tablet by  mouth every other day., Disp: , Rfl:    Cholecalciferol (VITAMIN D3) 5000 units TABS, 5,000 IU OTC vitamin D3 daily. (Patient taking differently: Take 5,000 Units by mouth daily.), Disp: 90 tablet, Rfl: 3   Colchicine 0.6 MG CAPS, PLEASE SEE ATTACHED FOR DETAILED DIRECTIONS, Disp: , Rfl:    cyclobenzaprine (FLEXERIL) 10 MG tablet, TAKE 1 TABLET BY MOUTH TWICE A DAY, Disp: 180 tablet, Rfl: 0   cyclobenzaprine (FLEXERIL) 10 MG tablet, Take 1 tablet by mouth 2 (two) times daily., Disp: , Rfl:    DANDELION PO, Take 1 tablet by mouth every other day., Disp: , Rfl:    doxycycline (VIBRA-TABS) 100 MG tablet, Take 1 tablet (100 mg total) by mouth 2 (two) times daily., Disp: 28 tablet, Rfl: 0   FARXIGA 10 MG TABS tablet, Take 10 mg by mouth daily., Disp: , Rfl:    glucose blood (ONE TOUCH ULTRA TEST) test strip, Use to test blood sugar, test in the morning fasting and test after largest meal of the day., Disp: 100 each, Rfl: 11   LAGEVRIO 200 MG CAPS capsule, SMARTSIG:4 Capsule(s) By Mouth Every 12 Hours, Disp: , Rfl:    meloxicam (MOBIC) 15 MG tablet, TAKE  1 TABLET BY MOUTH EVERY DAY, Disp: 90 tablet, Rfl: 0   meloxicam (MOBIC) 15 MG tablet, Take 1 tablet by mouth daily., Disp: , Rfl:    mupirocin ointment (BACTROBAN) 2 %, Apply 1 application topically 2 (two) times daily., Disp: 30 g, Rfl: 2   NARCAN 4 MG/0.1ML LIQD nasal spray kit, 1 SPRAY AS NEEDED, Disp: , Rfl:    Olmesartan-amLODIPine-HCTZ 20-5-12.5 MG TABS, TAKE 1 TABLET BY MOUTH EVERY DAY, Disp: 90 tablet, Rfl: 0   ondansetron (ZOFRAN) 4 MG tablet, Take 1 tablet (4 mg total) by mouth every 8 (eight) hours as needed for nausea or vomiting., Disp: 20 tablet, Rfl: 0   ONETOUCH DELICA LANCETS 95A MISC, Use for testing blood sugar, test once in the morning fasting and test after largest meal of the day., Disp: 100 each, Rfl: 11   OZEMPIC, 0.25 OR 0.5 MG/DOSE, 2 MG/1.5ML SOPN, INJECT 0.25MG INTO SKIN ONCE WEEKLY FOR 4 WEEKS, THEN INJECT 0.5MG INTO SKIN  ONCE WEEKLY., Disp: 1.5 mL, Rfl: 2   Potassium 99 MG TABS, Take 99 mg by mouth every other day. , Disp: , Rfl:    rosuvastatin (CRESTOR) 5 MG tablet, Take 5 mg by mouth daily., Disp: , Rfl:    traMADol (ULTRAM) 50 MG tablet, Take 2 tablets (100 mg total) by mouth every 6 (six) hours as needed. For pain, Disp: 30 tablet, Rfl:    TURMERIC CURCUMIN PO, Take 1 tablet by mouth every other day., Disp: , Rfl:    valACYclovir (VALTREX) 1000 MG tablet, TAKE 1 TABLET BY MOUTH DAILY AS NEEDED (FOR OUTBREAKS ONLY)., Disp: 5 tablet, Rfl: 0   vitamin B-12 1000 MCG tablet, Take 1 tablet (1,000 mcg total) by mouth daily., Disp: 30 tablet, Rfl: 0   vitamin C (ASCORBIC ACID) 500 MG tablet, Take 500 mg by mouth daily., Disp: , Rfl:    VITAMIN E PO, Take 1 tablet by mouth daily., Disp: , Rfl:   Social History   Tobacco Use  Smoking Status Former   Packs/day: 0.80   Years: 15.00   Total pack years: 12.00   Types: Cigarettes   Quit date: 07/16/2004   Years since quitting: 17.5  Smokeless Tobacco Never    No Known Allergies Objective:  There were no vitals filed for this visit. There is no height or weight on file to calculate BMI. Constitutional Well developed. Well nourished.  Vascular Foot warm and well perfused. Capillary refill normal to all digits.   Neurologic Normal speech. Oriented to person, place, and time. Epicritic sensation to light touch grossly present bilaterally.  Dermatologic Skin completely epithelialized over the third toe.  Superficial breakdown left second digit limited to the breakdown of the skin abou 0.2 cm x 0.2 cm in size.  No purulent drainage noted no malodor present mild erythema noted.  Orthopedic: Mild tenderness to palpation noted about the second digit   Radiographs: Interval loss of partial left third digit. No osseous erosions noted to the second digit.  Assessment:   1. Post-operative state   2. Toe ulcer, left, limited to breakdown of skin ([place])   3. Type 2  diabetes mellitus with complication, without long-term current use of insulin ([place])      Plan:  Patient was evaluated and treated and all questions answered.  S/p foot surgery left -Has clinically healed and reepithelialized.  Left second digit toe ulceration limited to the breakdown of the skin -Debridement as below. -Dressed with silvadene, DSD. -Off-loading with toe cap -No  abx indicated.  -Discussed glucose control and proper protein-rich diet.  -Discussed if any worsening redness, pain, fever or chills to call or may need to report to the emergency room. Patient expressed understanding.   Procedure: Excisional Debridement of Wound Rationale: Removal of non-viable soft tissue from the wound to promote healing.  Anesthesia: none Pre-Debridement Wound Measurements: overlying callus  Post-Debridement Wound Measurements: 0.2 cm x 0.2 cm x 0.1 cm  Type of Debridement: Sharp Excisional Tissue Removed: Non-viable soft tissue Depth of Debridement: subcutaneous tissue. Technique: Sharp excisional debridement to bleeding, viable wound base.  Dressing: Dry, sterile, compression dressing. Disposition: Patient tolerated procedure well. Patient to return in 2 week for follow-up.  Return in about 2 weeks (around 02/20/2022) for wound check.   No follow-ups on file.

## 2022-02-20 ENCOUNTER — Encounter: Payer: Medicare Other | Admitting: Podiatry

## 2022-03-02 ENCOUNTER — Ambulatory Visit (INDEPENDENT_AMBULATORY_CARE_PROVIDER_SITE_OTHER): Payer: Medicare Other

## 2022-03-02 ENCOUNTER — Telehealth: Payer: Self-pay

## 2022-03-02 ENCOUNTER — Ambulatory Visit: Payer: Medicare Other | Admitting: Podiatry

## 2022-03-02 ENCOUNTER — Encounter: Payer: Self-pay | Admitting: Podiatry

## 2022-03-02 DIAGNOSIS — L97524 Non-pressure chronic ulcer of other part of left foot with necrosis of bone: Secondary | ICD-10-CM | POA: Diagnosis not present

## 2022-03-02 DIAGNOSIS — E118 Type 2 diabetes mellitus with unspecified complications: Secondary | ICD-10-CM

## 2022-03-02 DIAGNOSIS — Z9889 Other specified postprocedural states: Secondary | ICD-10-CM | POA: Diagnosis not present

## 2022-03-02 DIAGNOSIS — E11622 Type 2 diabetes mellitus with other skin ulcer: Secondary | ICD-10-CM | POA: Diagnosis not present

## 2022-03-02 MED ORDER — DOXYCYCLINE HYCLATE 100 MG PO TABS: 100.0000 mg | ORAL_TABLET | Freq: Two times a day (BID) | ORAL | 0 refills | Status: AC

## 2022-03-02 NOTE — Progress Notes (Signed)
Subjective:  Patient ID: Robert [name], male    DOB: 05-06-1956,  MRN: [id number]  Chief Complaint  Patient presents with   Wound Check    Leftt foot second toe wound     DOS: 12/26/21 Procedure: Left third partial digit amputation  66 y.o. male returns postop visit for left third digit amputation 9 weeks out. This is doing ok and has healed.   Second digit wound on the left has continued to worsen despite oral antiboitics and wound care. Patient is eager to just go ahead and amputate toe so he no longer has to deal with wound and infection.     Review of Systems: Negative except as noted in the HPI. Denies N/V/F/Ch.  Past Medical History:  Diagnosis Date   Allergic rhinitis due to pollen    Arthritis    Asthma    Back pain    Barrett esophagus    Depressive disorder    Diabetes mellitus type 2 in obese (HCC)    Exposure to hepatitis B    Exposure to hepatitis C    Narcotic abuse (HCC)    pain medications   OSA (obstructive sleep apnea)    on CPAP     Current Outpatient Medications:    doxycycline (VIBRA-TABS) 100 MG tablet, Take 1 tablet (100 mg total) by mouth 2 (two) times daily for 10 days., Disp: 20 tablet, Rfl: 0   albuterol (VENTOLIN HFA) 108 (90 Base) MCG/ACT inhaler, INHALE 1-2 PUFFS BY MOUTH EVERY 6 HOURS AS NEEDED FOR WHEEZE OR SHORTNESS OF BREATH., Disp: 8.5 each, Rfl: 0   amLODipine-olmesartan (AZOR) 5-40 MG tablet, Take 1 tablet by mouth daily., Disp: , Rfl:    APPLE CIDER VINEGAR PO, Take 15 mLs by mouth 2 (two) times daily. , Disp: , Rfl:    B Complex Vitamins (B COMPLEX 100 PO), Take 1 capsule by mouth every other day. , Disp: , Rfl:    blood glucose meter kit and supplies, Dispense based on patient and insurance preference. Use to check glucose fasting in the AM and then after largest meal of the day.. (FOR ICD-10 E10.9, E11.9)., Disp: 1 each, Rfl: 0   Blood Glucose Monitoring Suppl (ONE TOUCH ULTRA MINI) w/Device KIT, Use to test blood sugar, test in the  morning fasting and test after largest meal of the day., Disp: 1 each, Rfl: 0   CALCIUM-MAGNESIUM-ZINC PO, Take 1 tablet by mouth every other day., Disp: , Rfl:    Cholecalciferol (VITAMIN D3) 5000 units TABS, 5,000 IU OTC vitamin D3 daily. (Patient taking differently: Take 5,000 Units by mouth daily.), Disp: 90 tablet, Rfl: 3   Colchicine 0.6 MG CAPS, PLEASE SEE ATTACHED FOR DETAILED DIRECTIONS, Disp: , Rfl:    cyclobenzaprine (FLEXERIL) 10 MG tablet, TAKE 1 TABLET BY MOUTH TWICE A DAY, Disp: 180 tablet, Rfl: 0   cyclobenzaprine (FLEXERIL) 10 MG tablet, Take 1 tablet by mouth 2 (two) times daily., Disp: , Rfl:    DANDELION PO, Take 1 tablet by mouth every other day., Disp: , Rfl:    doxycycline (VIBRA-TABS) 100 MG tablet, Take 1 tablet (100 mg total) by mouth 2 (two) times daily., Disp: 28 tablet, Rfl: 0   FARXIGA 10 MG TABS tablet, Take 10 mg by mouth daily., Disp: , Rfl:    glucose blood (ONE TOUCH ULTRA TEST) test strip, Use to test blood sugar, test in the morning fasting and test after largest meal of the day., Disp: 100 each, Rfl: 11  LAGEVRIO 200 MG CAPS capsule, SMARTSIG:4 Capsule(s) By Mouth Every 12 Hours, Disp: , Rfl:    meloxicam (MOBIC) 15 MG tablet, TAKE 1 TABLET BY MOUTH EVERY DAY, Disp: 90 tablet, Rfl: 0   meloxicam (MOBIC) 15 MG tablet, Take 1 tablet by mouth daily., Disp: , Rfl:    mupirocin ointment (BACTROBAN) 2 %, Apply 1 application topically 2 (two) times daily., Disp: 30 g, Rfl: 2   NARCAN 4 MG/0.1ML LIQD nasal spray kit, 1 SPRAY AS NEEDED, Disp: , Rfl:    Olmesartan-amLODIPine-HCTZ 20-5-12.5 MG TABS, TAKE 1 TABLET BY MOUTH EVERY DAY, Disp: 90 tablet, Rfl: 0   ondansetron (ZOFRAN) 4 MG tablet, Take 1 tablet (4 mg total) by mouth every 8 (eight) hours as needed for nausea or vomiting., Disp: 20 tablet, Rfl: 0   ONETOUCH DELICA LANCETS 99I MISC, Use for testing blood sugar, test once in the morning fasting and test after largest meal of the day., Disp: 100 each, Rfl: 11    OZEMPIC, 0.25 OR 0.5 MG/DOSE, 2 MG/1.5ML SOPN, INJECT 0.25MG INTO SKIN ONCE WEEKLY FOR 4 WEEKS, THEN INJECT 0.5MG INTO SKIN ONCE WEEKLY., Disp: 1.5 mL, Rfl: 2   Potassium 99 MG TABS, Take 99 mg by mouth every other day. , Disp: , Rfl:    rosuvastatin (CRESTOR) 5 MG tablet, Take 5 mg by mouth daily., Disp: , Rfl:    traMADol (ULTRAM) 50 MG tablet, Take 2 tablets (100 mg total) by mouth every 6 (six) hours as needed. For pain, Disp: 30 tablet, Rfl:    traMADol (ULTRAM) 50 MG tablet, 2 tablets Orally up to four times daily for 30 days, Disp: , Rfl:    TURMERIC CURCUMIN PO, Take 1 tablet by mouth every other day., Disp: , Rfl:    valACYclovir (VALTREX) 1000 MG tablet, TAKE 1 TABLET BY MOUTH DAILY AS NEEDED (FOR OUTBREAKS ONLY)., Disp: 5 tablet, Rfl: 0   vitamin B-12 1000 MCG tablet, Take 1 tablet (1,000 mcg total) by mouth daily., Disp: 30 tablet, Rfl: 0   vitamin C (ASCORBIC ACID) 500 MG tablet, Take 500 mg by mouth daily., Disp: , Rfl:    VITAMIN E PO, Take 1 tablet by mouth daily., Disp: , Rfl:   Social History   Tobacco Use  Smoking Status Former   Packs/day: 0.80   Years: 15.00   Total pack years: 12.00   Types: Cigarettes   Quit date: 07/16/2004   Years since quitting: 17.6  Smokeless Tobacco Never    No Known Allergies Objective:  There were no vitals filed for this visit. There is no height or weight on file to calculate BMI. Constitutional Well developed. Well nourished.  Vascular Foot warm and well perfused. Capillary refill normal to all digits.   Neurologic Normal speech. Oriented to person, place, and time. Epicritic sensation to light touch grossly present bilaterally.  Dermatologic Skin completely epithelialized over the third toe.  Worsened second digit wound on left with probe to bone and surroundign erythema and edema.   No purulent drainage noted no malodor present mild erythema noted.  Orthopedic: Mild tenderness to palpation noted about the second digit    Radiographs: Interval loss of partial left third digit. No osseous erosions noted to the second digit.  Assessment:   1. Toe ulcer, left, with necrosis of bone ([place])   2. Type 2 diabetes mellitus with complication, without long-term current use of insulin ([place])   3. Post-operative state       Plan:  Patient was  evaluated and treated and all questions answered.  S/p foot surgery left -Has clinically healed and reepithelialized.  Left second digit toe ulceration limited to the breakdown of the skin -No debridement today. Culture taken of toe wound  and sent. -Dressed with silvadene, DSD. -Off-loading with surgical shoe -Doxycycline sent to pharmacy x 10 days.  -Discussed glucose control and proper protein-rich diet.  -Discussed if any worsening redness, pain, fever or chills to call or may need to report to the emergency room. Patient expressed understanding.  -Discussed partial amputation of second digit and patient in agreement with plan.  -Informed surgical risk consent was reviewed and read aloud to the patient.  I reviewed the films.  I have discussed my findings with the patient in great detail.  I have discussed all risks including but not limited to infection, stiffness, scarring, limp, disability, deformity, damage to blood vessels and nerves, numbness, poor healing, need for braces, arthritis, chronic pain, amputation, death.  All benefits and realistic expectations discussed in great detail.  I have made no promises as to the outcome.  I have provided realistic expectations.  I have offered the patient a 2nd opinion, which they have declined and assured me they preferred to proceed despite the risks. -Will try to urgently get him scheduled for next Tuesday for left second digit partial amputation in the setting of osteomyelitis.      Return if symptoms worsen or fail to improve.   Return if symptoms worsen or fail to improve.

## 2022-03-02 NOTE — Telephone Encounter (Signed)
DOS 03/06/2022  AMPUTATION 2ND LT - 28825  [facility] MEDICARE  Notification or Prior Authorization is not required for the requested services  Decision ID #:[id number]

## 2022-03-06 ENCOUNTER — Other Ambulatory Visit: Payer: Self-pay | Admitting: Podiatry

## 2022-03-06 DIAGNOSIS — L97524 Non-pressure chronic ulcer of other part of left foot with necrosis of bone: Secondary | ICD-10-CM | POA: Diagnosis not present

## 2022-03-06 MED ORDER — OXYCODONE HCL 5 MG PO TABS: 5.0000 mg | ORAL_TABLET | ORAL | 0 refills | Status: DC | PRN

## 2022-03-06 MED ORDER — ONDANSETRON HCL 4 MG PO TABS: 4.0000 mg | ORAL_TABLET | Freq: Three times a day (TID) | ORAL | 0 refills | Status: DC | PRN

## 2022-03-15 ENCOUNTER — Encounter: Payer: Self-pay | Admitting: Podiatry

## 2022-03-15 ENCOUNTER — Ambulatory Visit (INDEPENDENT_AMBULATORY_CARE_PROVIDER_SITE_OTHER): Payer: Medicare Other

## 2022-03-15 ENCOUNTER — Ambulatory Visit (INDEPENDENT_AMBULATORY_CARE_PROVIDER_SITE_OTHER): Payer: Medicare Other | Admitting: Podiatry

## 2022-03-15 DIAGNOSIS — T8140XA Infection following a procedure, unspecified, initial encounter: Secondary | ICD-10-CM | POA: Diagnosis not present

## 2022-03-15 DIAGNOSIS — E118 Type 2 diabetes mellitus with unspecified complications: Secondary | ICD-10-CM

## 2022-03-15 DIAGNOSIS — Z9889 Other specified postprocedural states: Secondary | ICD-10-CM

## 2022-03-15 NOTE — Progress Notes (Signed)
Subjective:  Patient ID: Robert [name], male    DOB: Nov 21, 1955,  MRN: [id number]  Chief Complaint  Patient presents with   Foot Ulcer     Left foot ulcer f/u     DOS: 03/06/22 Procedure: Left second digit partial amputation  66 y.o. male returns for POV#1. Relates he overall is doing well. Still taking the antibiotics and has two more weeks worth that he happened to get extra. Relates he did get the dressing wet yesterday after a shower and changed it.   Review of Systems: Negative except as noted in the HPI. Denies N/V/F/Ch.  Past Medical History:  Diagnosis Date   Allergic rhinitis due to pollen    Arthritis    Asthma    Back pain    Barrett esophagus    Depressive disorder    Diabetes mellitus type 2 in obese (HCC)    Exposure to hepatitis B    Exposure to hepatitis C    Narcotic abuse (HCC)    pain medications   OSA (obstructive sleep apnea)    on CPAP     Current Outpatient Medications:    albuterol (VENTOLIN HFA) 108 (90 Base) MCG/ACT inhaler, INHALE 1-2 PUFFS BY MOUTH EVERY 6 HOURS AS NEEDED FOR WHEEZE OR SHORTNESS OF BREATH., Disp: 8.5 each, Rfl: 0   amLODipine-olmesartan (AZOR) 5-40 MG tablet, Take 1 tablet by mouth daily., Disp: , Rfl:    APPLE CIDER VINEGAR PO, Take 15 mLs by mouth 2 (two) times daily. , Disp: , Rfl:    B Complex Vitamins (B COMPLEX 100 PO), Take 1 capsule by mouth every other day. , Disp: , Rfl:    blood glucose meter kit and supplies, Dispense based on patient and insurance preference. Use to check glucose fasting in the AM and then after largest meal of the day.. (FOR ICD-10 E10.9, E11.9)., Disp: 1 each, Rfl: 0   Blood Glucose Monitoring Suppl (ONE TOUCH ULTRA MINI) w/Device KIT, Use to test blood sugar, test in the morning fasting and test after largest meal of the day., Disp: 1 each, Rfl: 0   CALCIUM-MAGNESIUM-ZINC PO, Take 1 tablet by mouth every other day., Disp: , Rfl:    Cholecalciferol (VITAMIN D3) 5000 units TABS, 5,000 IU OTC vitamin  D3 daily. (Patient taking differently: Take 5,000 Units by mouth daily.), Disp: 90 tablet, Rfl: 3   Colchicine 0.6 MG CAPS, PLEASE SEE ATTACHED FOR DETAILED DIRECTIONS, Disp: , Rfl:    cyclobenzaprine (FLEXERIL) 10 MG tablet, TAKE 1 TABLET BY MOUTH TWICE A DAY, Disp: 180 tablet, Rfl: 0   cyclobenzaprine (FLEXERIL) 10 MG tablet, Take 1 tablet by mouth 2 (two) times daily., Disp: , Rfl:    DANDELION PO, Take 1 tablet by mouth every other day., Disp: , Rfl:    doxycycline (VIBRA-TABS) 100 MG tablet, Take 1 tablet (100 mg total) by mouth 2 (two) times daily., Disp: 28 tablet, Rfl: 0   FARXIGA 10 MG TABS tablet, Take 10 mg by mouth daily., Disp: , Rfl:    glucose blood (ONE TOUCH ULTRA TEST) test strip, Use to test blood sugar, test in the morning fasting and test after largest meal of the day., Disp: 100 each, Rfl: 11   LAGEVRIO 200 MG CAPS capsule, SMARTSIG:4 Capsule(s) By Mouth Every 12 Hours, Disp: , Rfl:    meloxicam (MOBIC) 15 MG tablet, TAKE 1 TABLET BY MOUTH EVERY DAY, Disp: 90 tablet, Rfl: 0   meloxicam (MOBIC) 15 MG tablet, Take 1 tablet by mouth  daily., Disp: , Rfl:    mupirocin ointment (BACTROBAN) 2 %, Apply 1 application topically 2 (two) times daily., Disp: 30 g, Rfl: 2   NARCAN 4 MG/0.1ML LIQD nasal spray kit, 1 SPRAY AS NEEDED, Disp: , Rfl:    Olmesartan-amLODIPine-HCTZ 20-5-12.5 MG TABS, TAKE 1 TABLET BY MOUTH EVERY DAY, Disp: 90 tablet, Rfl: 0   ondansetron (ZOFRAN) 4 MG tablet, Take 1 tablet (4 mg total) by mouth every 8 (eight) hours as needed for nausea or vomiting., Disp: 20 tablet, Rfl: 0   ondansetron (ZOFRAN) 4 MG tablet, Take 1 tablet (4 mg total) by mouth every 8 (eight) hours as needed for nausea or vomiting., Disp: 20 tablet, Rfl: 0   ONETOUCH DELICA LANCETS 47W MISC, Use for testing blood sugar, test once in the morning fasting and test after largest meal of the day., Disp: 100 each, Rfl: 11   oxyCODONE (ROXICODONE) 5 MG immediate release tablet, Take 1 tablet (5 mg total)  by mouth every 4 (four) hours as needed for severe pain., Disp: 20 tablet, Rfl: 0   OZEMPIC, 0.25 OR 0.5 MG/DOSE, 2 MG/1.5ML SOPN, INJECT 0.25MG INTO SKIN ONCE WEEKLY FOR 4 WEEKS, THEN INJECT 0.5MG INTO SKIN ONCE WEEKLY., Disp: 1.5 mL, Rfl: 2   Potassium 99 MG TABS, Take 99 mg by mouth every other day. , Disp: , Rfl:    rosuvastatin (CRESTOR) 5 MG tablet, Take 5 mg by mouth daily., Disp: , Rfl:    traMADol (ULTRAM) 50 MG tablet, Take 2 tablets (100 mg total) by mouth every 6 (six) hours as needed. For pain, Disp: 30 tablet, Rfl:    traMADol (ULTRAM) 50 MG tablet, 2 tablets Orally up to four times daily for 30 days, Disp: , Rfl:    TURMERIC CURCUMIN PO, Take 1 tablet by mouth every other day., Disp: , Rfl:    valACYclovir (VALTREX) 1000 MG tablet, TAKE 1 TABLET BY MOUTH DAILY AS NEEDED (FOR OUTBREAKS ONLY)., Disp: 5 tablet, Rfl: 0   vitamin B-12 1000 MCG tablet, Take 1 tablet (1,000 mcg total) by mouth daily., Disp: 30 tablet, Rfl: 0   vitamin C (ASCORBIC ACID) 500 MG tablet, Take 500 mg by mouth daily., Disp: , Rfl:    VITAMIN E PO, Take 1 tablet by mouth daily., Disp: , Rfl:   Social History   Tobacco Use  Smoking Status Former   Packs/day: 0.80   Years: 15.00   Total pack years: 12.00   Types: Cigarettes   Quit date: 07/16/2004   Years since quitting: 17.6  Smokeless Tobacco Never    No Known Allergies Objective:  There were no vitals filed for this visit. There is no height or weight on file to calculate BMI. Constitutional Well developed. Well nourished.  Vascular Foot warm and well perfused. Capillary refill normal to all digits.   Neurologic Normal speech. Oriented to person, place, and time. Epicritic sensation to light touch grossly present bilaterally.  Dermatologic Skin healing well without signs of infection. Skin edges well coapted without signs of infection. Mild erythema present.   Orthopedic: Tenderness to palpation noted about the surgical site.   Radiographs:  Interval resection of distal second digit.  Assessment:   1. Postoperative infection, unspecified type, initial encounter   2. Post-operative state   3. Type 2 diabetes mellitus with complication, without long-term current use of insulin ([place])    Plan:  Patient was evaluated and treated and all questions answered.  S/p foot surgery left -Progressing as expected post-operatively. -WB  Status: WBAT  in surgical shoe -Sutures: intact. -Medications: continue doxycycline.  -Foot redressed. Follow-up in 2 weeks for suture removal.   No follow-ups on file.

## 2022-03-20 ENCOUNTER — Other Ambulatory Visit: Payer: Self-pay | Admitting: Podiatry

## 2022-03-20 ENCOUNTER — Telehealth: Payer: Self-pay | Admitting: Podiatry

## 2022-03-20 MED ORDER — LINEZOLID 600 MG PO TABS: 600.0000 mg | ORAL_TABLET | Freq: Two times a day (BID) | ORAL | 0 refills | Status: AC

## 2022-03-20 NOTE — Telephone Encounter (Signed)
Sent in a different prescription antibiotics for him to see if this helps and would like to follow-up with him sooner. Thanks

## 2022-03-20 NOTE — Telephone Encounter (Signed)
Pt had 2ND DIGIT PARTIAL AMPUTATION done on 03/06/22 and states his toe is still red and sore. He was told to call if its not getting better.   Please advise.

## 2022-03-29 ENCOUNTER — Ambulatory Visit: Payer: Medicare Other | Admitting: Podiatry

## 2022-03-29 ENCOUNTER — Encounter: Payer: Medicare Other | Admitting: Podiatry

## 2022-04-02 ENCOUNTER — Encounter: Payer: Self-pay | Admitting: Podiatry

## 2022-04-02 ENCOUNTER — Ambulatory Visit (INDEPENDENT_AMBULATORY_CARE_PROVIDER_SITE_OTHER): Payer: Medicare Other | Admitting: Podiatry

## 2022-04-02 DIAGNOSIS — Z9889 Other specified postprocedural states: Secondary | ICD-10-CM

## 2022-04-02 DIAGNOSIS — E118 Type 2 diabetes mellitus with unspecified complications: Secondary | ICD-10-CM

## 2022-04-02 NOTE — Progress Notes (Signed)
Subjective:  Patient ID: Robert [name], male    DOB: February 16, 1956,  MRN: [id number]  Chief Complaint  Patient presents with   Routine Post Op    Pain level is very good today, minimal drainage     DOS: 03/06/22 Procedure: Left second digit partial amputation  66 y.o. male returns for POV#2. Relates he overall is doing well. Still taking the antibiotics. Relates doing well and not having any pain.    Review of Systems: Negative except as noted in the HPI. Denies N/V/F/Ch.  Past Medical History:  Diagnosis Date   Allergic rhinitis due to pollen    Arthritis    Asthma    Back pain    Barrett esophagus    Depressive disorder    Diabetes mellitus type 2 in obese (HCC)    Exposure to hepatitis B    Exposure to hepatitis C    Narcotic abuse (HCC)    pain medications   OSA (obstructive sleep apnea)    on CPAP     Current Outpatient Medications:    albuterol (VENTOLIN HFA) 108 (90 Base) MCG/ACT inhaler, INHALE 1-2 PUFFS BY MOUTH EVERY 6 HOURS AS NEEDED FOR WHEEZE OR SHORTNESS OF BREATH., Disp: 8.5 each, Rfl: 0   amLODipine-olmesartan (AZOR) 5-40 MG tablet, Take 1 tablet by mouth daily., Disp: , Rfl:    APPLE CIDER VINEGAR PO, Take 15 mLs by mouth 2 (two) times daily. , Disp: , Rfl:    B Complex Vitamins (B COMPLEX 100 PO), Take 1 capsule by mouth every other day. , Disp: , Rfl:    blood glucose meter kit and supplies, Dispense based on patient and insurance preference. Use to check glucose fasting in the AM and then after largest meal of the day.. (FOR ICD-10 E10.9, E11.9)., Disp: 1 each, Rfl: 0   Blood Glucose Monitoring Suppl (ONE TOUCH ULTRA MINI) w/Device KIT, Use to test blood sugar, test in the morning fasting and test after largest meal of the day., Disp: 1 each, Rfl: 0   CALCIUM-MAGNESIUM-ZINC PO, Take 1 tablet by mouth every other day., Disp: , Rfl:    Cholecalciferol (VITAMIN D3) 5000 units TABS, 5,000 IU OTC vitamin D3 daily. (Patient taking differently: Take 5,000 Units by  mouth daily.), Disp: 90 tablet, Rfl: 3   Colchicine 0.6 MG CAPS, PLEASE SEE ATTACHED FOR DETAILED DIRECTIONS, Disp: , Rfl:    DANDELION PO, Take 1 tablet by mouth every other day., Disp: , Rfl:    doxycycline (VIBRA-TABS) 100 MG tablet, Take 1 tablet (100 mg total) by mouth 2 (two) times daily., Disp: 28 tablet, Rfl: 0   FARXIGA 10 MG TABS tablet, Take 10 mg by mouth daily., Disp: , Rfl:    glucose blood (ONE TOUCH ULTRA TEST) test strip, Use to test blood sugar, test in the morning fasting and test after largest meal of the day., Disp: 100 each, Rfl: 11   LAGEVRIO 200 MG CAPS capsule, SMARTSIG:4 Capsule(s) By Mouth Every 12 Hours, Disp: , Rfl:    meloxicam (MOBIC) 15 MG tablet, TAKE 1 TABLET BY MOUTH EVERY DAY, Disp: 90 tablet, Rfl: 0   meloxicam (MOBIC) 15 MG tablet, Take 1 tablet by mouth daily., Disp: , Rfl:    mupirocin ointment (BACTROBAN) 2 %, Apply 1 application topically 2 (two) times daily., Disp: 30 g, Rfl: 2   NARCAN 4 MG/0.1ML LIQD nasal spray kit, 1 SPRAY AS NEEDED, Disp: , Rfl:    Olmesartan-amLODIPine-HCTZ 20-5-12.5 MG TABS, TAKE 1 TABLET BY MOUTH  EVERY DAY, Disp: 90 tablet, Rfl: 0   ondansetron (ZOFRAN) 4 MG tablet, Take 1 tablet (4 mg total) by mouth every 8 (eight) hours as needed for nausea or vomiting., Disp: 20 tablet, Rfl: 0   ondansetron (ZOFRAN) 4 MG tablet, Take 1 tablet (4 mg total) by mouth every 8 (eight) hours as needed for nausea or vomiting., Disp: 20 tablet, Rfl: 0   ONETOUCH DELICA LANCETS 58K MISC, Use for testing blood sugar, test once in the morning fasting and test after largest meal of the day., Disp: 100 each, Rfl: 11   oxyCODONE (ROXICODONE) 5 MG immediate release tablet, Take 1 tablet (5 mg total) by mouth every 4 (four) hours as needed for severe pain., Disp: 20 tablet, Rfl: 0   OZEMPIC, 0.25 OR 0.5 MG/DOSE, 2 MG/1.5ML SOPN, INJECT 0.25MG INTO SKIN ONCE WEEKLY FOR 4 WEEKS, THEN INJECT 0.5MG INTO SKIN ONCE WEEKLY., Disp: 1.5 mL, Rfl: 2   Potassium 99 MG  TABS, Take 99 mg by mouth every other day. , Disp: , Rfl:    rosuvastatin (CRESTOR) 5 MG tablet, Take 5 mg by mouth daily., Disp: , Rfl:    traMADol (ULTRAM) 50 MG tablet, Take 2 tablets (100 mg total) by mouth every 6 (six) hours as needed. For pain, Disp: 30 tablet, Rfl:    TURMERIC CURCUMIN PO, Take 1 tablet by mouth every other day., Disp: , Rfl:    valACYclovir (VALTREX) 1000 MG tablet, TAKE 1 TABLET BY MOUTH DAILY AS NEEDED (FOR OUTBREAKS ONLY)., Disp: 5 tablet, Rfl: 0   vitamin B-12 1000 MCG tablet, Take 1 tablet (1,000 mcg total) by mouth daily., Disp: 30 tablet, Rfl: 0   vitamin C (ASCORBIC ACID) 500 MG tablet, Take 500 mg by mouth daily., Disp: , Rfl:    VITAMIN E PO, Take 1 tablet by mouth daily., Disp: , Rfl:   Social History   Tobacco Use  Smoking Status Former   Packs/day: 0.80   Years: 15.00   Total pack years: 12.00   Types: Cigarettes   Quit date: 07/16/2004   Years since quitting: 17.7  Smokeless Tobacco Never    No Known Allergies Objective:  There were no vitals filed for this visit. There is no height or weight on file to calculate BMI. Constitutional Well developed. Well nourished.  Vascular Foot warm and well perfused. Capillary refill normal to all digits.   Neurologic Normal speech. Oriented to person, place, and time. Epicritic sensation to light touch grossly present bilaterally.  Dermatologic Skin healing well without signs of infection. Skin edges well coapted without signs of infection. Mild erythema present.   Orthopedic: Tenderness to palpation noted about the surgical site.   Radiographs: Interval resection of distal second digit.  Assessment:   1. Post-operative state   2. Type 2 diabetes mellitus with complication, without long-term current use of insulin ([place])    Plan:  Patient was evaluated and treated and all questions answered.  S/p foot surgery left -Progressing as expected post-operatively. -WB Status: WBAT  in surgical  shoe -Sutures: removed today without incident.  -Medications: n/a -Foot redressed. Follow-up in 3 weeks for post-op check.   No follow-ups on file.

## 2022-04-18 ENCOUNTER — Encounter: Payer: Self-pay | Admitting: Podiatry

## 2022-04-23 ENCOUNTER — Ambulatory Visit (INDEPENDENT_AMBULATORY_CARE_PROVIDER_SITE_OTHER): Payer: Medicare Other

## 2022-04-23 ENCOUNTER — Ambulatory Visit (INDEPENDENT_AMBULATORY_CARE_PROVIDER_SITE_OTHER): Payer: Medicare Other | Admitting: Podiatry

## 2022-04-23 DIAGNOSIS — L600 Ingrowing nail: Secondary | ICD-10-CM

## 2022-04-23 DIAGNOSIS — Z9889 Other specified postprocedural states: Secondary | ICD-10-CM

## 2022-04-23 MED ORDER — GENTAMICIN SULFATE 0.1 % EX CREA: 1.0000 | TOPICAL_CREAM | Freq: Two times a day (BID) | CUTANEOUS | 1 refills | Status: DC

## 2022-04-23 MED ORDER — DOXYCYCLINE HYCLATE 100 MG PO TABS: 100.0000 mg | ORAL_TABLET | Freq: Two times a day (BID) | ORAL | 0 refills | Status: DC

## 2022-04-23 NOTE — Progress Notes (Signed)
   Chief Complaint  Patient presents with   Post-op Follow-up    POV #3 DOS 03/06/2022 LT 2ND DIGIT PARTIAL AMPUTATIO, patient states that the healing process has been great.    Subjective: Patient presents today for evaluation of pain to the medial border left great toe. Patient is concerned for possible ingrown nail.  It is very sensitive to touch.    Patient also is present status post partial toe amputation of the left second digit.  He does have a history of partial toe amputation secondary to callus and ulcer developments.  Patient presents today for further treatment and evaluation.  Past Medical History:  Diagnosis Date   Allergic rhinitis due to pollen    Arthritis    Asthma    Back pain    Barrett esophagus    Depressive disorder    Diabetes mellitus type 2 in obese (HCC)    Exposure to hepatitis B    Exposure to hepatitis C    Narcotic abuse (HCC)    pain medications   OSA (obstructive sleep apnea)    on CPAP     Objective:  General: Well developed, nourished, in no acute distress, alert and oriented x3   Dermatology: Skin is warm, dry and supple bilateral.  Medial border left great toe is tender with evidence of an ingrowing nail. Pain on palpation noted to the border of the nail fold. The remaining nails appear unremarkable at this time. There are no open sores, lesions.  Amputation sites to the second and third digit appear to be resolved completely.  No erythema or edema and skin incisions are healed completely  Vascular: DP and PT pulses palpable.  No clinical evidence of vascular compromise  Neruologic: Diminished via light touch bilateral.  Musculoskeletal: Prior to amputations bilateral  Assesement: #1 Paronychia with ingrowing nail medial border left great toe #2 history of prior toe amputations bilateral  Plan of Care:  1. Patient evaluated.  2. Discussed treatment alternatives and plan of care. Explained nail avulsion procedure and post procedure  course to patient. 3. Patient opted for temporary partial nail avulsion of the ingrown portion of the nail.  4. Prior to procedure, local anesthesia infiltration utilized using 3 ml of a 50:50 mixture of 2% plain lidocaine and 0.5% plain marcaine in a normal hallux block fashion and a betadine prep performed.  5. Partial temporary nail avulsion without chemical matrixectomy performed 6. Light dressing applied.  Post care instructions provided 7.  Prescription for gentamicin 2% cream  8.  Prescription for doxycycline 100 mg 2 times daily #20  9.  Return to clinic 2 weeks.  Edrick Kins, DPM [facility]  Dr. Edrick Kins, DPM    [address], [place] [address]                Office [phone]  Fax [phone]

## 2022-05-14 ENCOUNTER — Encounter: Payer: Medicare Other | Admitting: Podiatry

## 2022-05-23 ENCOUNTER — Ambulatory Visit: Payer: Medicare Other | Admitting: Podiatry

## 2022-05-23 ENCOUNTER — Ambulatory Visit (INDEPENDENT_AMBULATORY_CARE_PROVIDER_SITE_OTHER): Payer: Medicare Other

## 2022-05-23 DIAGNOSIS — E0843 Diabetes mellitus due to underlying condition with diabetic autonomic (poly)neuropathy: Secondary | ICD-10-CM

## 2022-05-23 DIAGNOSIS — L97522 Non-pressure chronic ulcer of other part of left foot with fat layer exposed: Secondary | ICD-10-CM

## 2022-05-23 DIAGNOSIS — L03032 Cellulitis of left toe: Secondary | ICD-10-CM

## 2022-05-23 MED ORDER — DOXYCYCLINE HYCLATE 100 MG PO TABS: 100.0000 mg | ORAL_TABLET | Freq: Two times a day (BID) | ORAL | 0 refills | Status: DC

## 2022-05-23 NOTE — Progress Notes (Signed)
Chief Complaint  Patient presents with   toe ulcer    Left 4th toe wound x 1 week. Pt states wound started as a blister. There is edema redness and pain. Pt is not taking any antibiotics.     Subjective:  66 y.o. male with PMHx of diabetes mellitus presenting today for acute onset of redness with swelling and a wound that developed to the distal tip of the left fourth toe.  Patient is a diabetic but he says that his A1c levels always remain around 6.5.  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  He cannot recall an incident that would have elicited the wound to the distal tip of the toe.  He presents for further treatment and evaluation   Past Medical History:  Diagnosis Date   Allergic rhinitis due to pollen    Arthritis    Asthma    Back pain    Barrett esophagus    Depressive disorder    Diabetes mellitus type 2 in obese (HCC)    Exposure to hepatitis B    Exposure to hepatitis C    Narcotic abuse (HCC)    pain medications   OSA (obstructive sleep apnea)    on CPAP     Past Surgical History:  Procedure Laterality Date   ABDOMINAL SURGERY     AMPUTATION Right 06/11/2018   Procedure: RIGHT 2ND RAY AMPUTATION;  Surgeon: Nadara Mustard, MD;  Location: [facility] OR;  Service: Orthopedics;  Laterality: Right;   BACK SURGERY     Rods and screws in lumbar area   COLONOSCOPY     ESOPHAGOGASTRODUODENOSCOPY  multiple   HERNIA REPAIR     6   JOINT REPLACEMENT     TOTAL KNEE ARTHROPLASTY  2009   left    No Known Allergies        Objective/Physical Exam General: The patient is alert and oriented x3 in no acute distress.  Dermatology:  Wound #1 noted to the distal tip of the left fourth toe measuring approximately 0.5 x 0.6 x 0.2 cm (LxWxD).   To the noted ulceration(s), there is no eschar. There is a moderate amount of slough, fibrin, and necrotic tissue noted. Granulation tissue and wound base is red. There is  a minimal amount of serosanguineous drainage noted. There is no exposed bone muscle-tendon ligament or joint. There is no malodor. Periwound integrity is intact. Skin is warm, dry and supple bilateral lower extremities.  Vascular: Skin is warm to touch.  Clinically there is no concern for vascular compromise.  There is localized erythema with edema encompassing the fourth toe.  This does not extend into the foot and is localized to the toe for now  Neurological: Light touch and protective threshold diminished bilaterally.   Musculoskeletal Exam: History of partial toe amputation second third digit left foot  Assessment: 1.  Ulcer distal tip of the left fourth toe secondary to diabetes mellitus 2. diabetes mellitus w/ peripheral neuropathy 3.  Cellulitis left fourth toe   Plan of Care:  1. Patient was evaluated. 2. medically necessary excisional debridement including subcutaneous tissue was performed using a tissue nipper and a chisel blade. Excisional debridement of all the necrotic nonviable tissue down to healthy bleeding viable tissue was performed with post-debridement measurements same as pre-. 3. the wound was cleansed and dry sterile dressing applied. 4.  Prescription for doxycycline 100 mg 2 times daily #  20  5.  Patient has a postsurgical shoe at home.  Wear daily  6.  Patient also has a prescription for mupirocin 2% ointment.  Apply daily with a light dressing  7.  Patient is to return to clinic in 1 week.  If there is improvement and resolution of the cellulitis and infection I do believe the patient would benefit from a flexor tenotomy of the toe to offload pressure from the distal tip.  Patient is in agreement but we will wait for the cellulitis and infection to resolve before any invasive procedure    Edrick Kins, DPM [facility]  Dr. Edrick Kins, DPM    [address], [place] [address]                Office  [phone]  Fax [phone]

## 2022-05-29 ENCOUNTER — Telehealth: Payer: Self-pay | Admitting: Podiatry

## 2022-05-29 ENCOUNTER — Other Ambulatory Visit: Payer: Self-pay | Admitting: Podiatry

## 2022-05-29 ENCOUNTER — Other Ambulatory Visit: Payer: Medicare Other

## 2022-05-29 MED ORDER — TRAMADOL HCL 50 MG PO TABS: 50.0000 mg | ORAL_TABLET | ORAL | 0 refills | Status: DC | PRN

## 2022-05-29 NOTE — Telephone Encounter (Signed)
Pt called in & requested a topical Rx for his 4th toe for pain. He's experiencing a lot of pain in that toe now it's spreading to the foot. He uses the [facility] pharmacy on [address]. Please advise.

## 2022-05-29 NOTE — Telephone Encounter (Signed)
Please inform the patient he needs to continue to apply the mupirocin ointment to the toe.  No pain creams.  I did call in a prescription for tramadol 50 mg daily every 4 hours as needed.  Thanks, Dr. Logan Bores

## 2022-05-29 NOTE — Telephone Encounter (Signed)
Called patient and was given instructions per provider and that a medication has been sent to pharmacy on file.

## 2022-06-01 ENCOUNTER — Inpatient Hospital Stay (HOSPITAL_COMMUNITY)
Admission: EM | Admit: 2022-06-01 | Discharge: 2022-06-04 | DRG: 617 | Disposition: A | Discharge status: Home / Self Care | Payer: Medicare Other | Attending: Internal Medicine | Admitting: Internal Medicine

## 2022-06-01 ENCOUNTER — Emergency Department (HOSPITAL_COMMUNITY): Payer: Medicare Other

## 2022-06-01 ENCOUNTER — Inpatient Hospital Stay (HOSPITAL_COMMUNITY): Payer: Medicare Other

## 2022-06-01 ENCOUNTER — Ambulatory Visit: Payer: Medicare Other | Admitting: Podiatry

## 2022-06-01 ENCOUNTER — Encounter (HOSPITAL_COMMUNITY): Payer: Self-pay | Admitting: Internal Medicine

## 2022-06-01 ENCOUNTER — Other Ambulatory Visit: Payer: Self-pay

## 2022-06-01 DIAGNOSIS — E1152 Type 2 diabetes mellitus with diabetic peripheral angiopathy with gangrene: Secondary | ICD-10-CM | POA: Diagnosis present

## 2022-06-01 DIAGNOSIS — E785 Hyperlipidemia, unspecified: Secondary | ICD-10-CM | POA: Diagnosis present

## 2022-06-01 DIAGNOSIS — I509 Heart failure, unspecified: Secondary | ICD-10-CM | POA: Diagnosis not present

## 2022-06-01 DIAGNOSIS — M869 Osteomyelitis, unspecified: Secondary | ICD-10-CM | POA: Diagnosis present

## 2022-06-01 DIAGNOSIS — G894 Chronic pain syndrome: Secondary | ICD-10-CM | POA: Diagnosis present

## 2022-06-01 DIAGNOSIS — L089 Local infection of the skin and subcutaneous tissue, unspecified: Principal | ICD-10-CM | POA: Diagnosis present

## 2022-06-01 DIAGNOSIS — Z79899 Other long term (current) drug therapy: Secondary | ICD-10-CM | POA: Diagnosis not present

## 2022-06-01 DIAGNOSIS — L97522 Non-pressure chronic ulcer of other part of left foot with fat layer exposed: Secondary | ICD-10-CM

## 2022-06-01 DIAGNOSIS — E1159 Type 2 diabetes mellitus with other circulatory complications: Secondary | ICD-10-CM | POA: Diagnosis present

## 2022-06-01 DIAGNOSIS — E119 Type 2 diabetes mellitus without complications: Secondary | ICD-10-CM | POA: Diagnosis present

## 2022-06-01 DIAGNOSIS — Z8349 Family history of other endocrine, nutritional and metabolic diseases: Secondary | ICD-10-CM

## 2022-06-01 DIAGNOSIS — E669 Obesity, unspecified: Secondary | ICD-10-CM | POA: Diagnosis present

## 2022-06-01 DIAGNOSIS — M86172 Other acute osteomyelitis, left ankle and foot: Secondary | ICD-10-CM | POA: Diagnosis not present

## 2022-06-01 DIAGNOSIS — M545 Low back pain, unspecified: Secondary | ICD-10-CM | POA: Diagnosis not present

## 2022-06-01 DIAGNOSIS — J45909 Unspecified asthma, uncomplicated: Secondary | ICD-10-CM | POA: Diagnosis present

## 2022-06-01 DIAGNOSIS — I152 Hypertension secondary to endocrine disorders: Secondary | ICD-10-CM | POA: Diagnosis present

## 2022-06-01 DIAGNOSIS — L03032 Cellulitis of left toe: Secondary | ICD-10-CM | POA: Diagnosis present

## 2022-06-01 DIAGNOSIS — Z87891 Personal history of nicotine dependence: Secondary | ICD-10-CM | POA: Diagnosis not present

## 2022-06-01 DIAGNOSIS — F32A Depression, unspecified: Secondary | ICD-10-CM | POA: Diagnosis present

## 2022-06-01 DIAGNOSIS — K219 Gastro-esophageal reflux disease without esophagitis: Secondary | ICD-10-CM | POA: Diagnosis present

## 2022-06-01 DIAGNOSIS — E11628 Type 2 diabetes mellitus with other skin complications: Secondary | ICD-10-CM | POA: Diagnosis present

## 2022-06-01 DIAGNOSIS — G8929 Other chronic pain: Secondary | ICD-10-CM | POA: Diagnosis not present

## 2022-06-01 DIAGNOSIS — E118 Type 2 diabetes mellitus with unspecified complications: Secondary | ICD-10-CM | POA: Diagnosis present

## 2022-06-01 DIAGNOSIS — L97529 Non-pressure chronic ulcer of other part of left foot with unspecified severity: Secondary | ICD-10-CM | POA: Diagnosis present

## 2022-06-01 DIAGNOSIS — J984 Other disorders of lung: Secondary | ICD-10-CM | POA: Diagnosis present

## 2022-06-01 DIAGNOSIS — E1142 Type 2 diabetes mellitus with diabetic polyneuropathy: Secondary | ICD-10-CM | POA: Diagnosis present

## 2022-06-01 DIAGNOSIS — I96 Gangrene, not elsewhere classified: Secondary | ICD-10-CM | POA: Diagnosis present

## 2022-06-01 DIAGNOSIS — E11621 Type 2 diabetes mellitus with foot ulcer: Secondary | ICD-10-CM | POA: Diagnosis present

## 2022-06-01 DIAGNOSIS — I10 Essential (primary) hypertension: Secondary | ICD-10-CM | POA: Diagnosis present

## 2022-06-01 DIAGNOSIS — G4733 Obstructive sleep apnea (adult) (pediatric): Secondary | ICD-10-CM

## 2022-06-01 DIAGNOSIS — Z96652 Presence of left artificial knee joint: Secondary | ICD-10-CM | POA: Diagnosis present

## 2022-06-01 DIAGNOSIS — E1169 Type 2 diabetes mellitus with other specified complication: Principal | ICD-10-CM | POA: Diagnosis present

## 2022-06-01 DIAGNOSIS — I2510 Atherosclerotic heart disease of native coronary artery without angina pectoris: Secondary | ICD-10-CM | POA: Diagnosis not present

## 2022-06-01 DIAGNOSIS — M009 Pyogenic arthritis, unspecified: Secondary | ICD-10-CM | POA: Diagnosis not present

## 2022-06-01 DIAGNOSIS — I110 Hypertensive heart disease with heart failure: Secondary | ICD-10-CM | POA: Diagnosis not present

## 2022-06-01 LAB — CBC
HCT: 41.2 % (ref 39.0–52.0)
Hemoglobin: 13.8 g/dL (ref 13.0–17.0)
MCH: 30.4 pg (ref 26.0–34.0)
MCHC: 33.5 g/dL (ref 30.0–36.0)
MCV: 90.7 fL (ref 80.0–100.0)
Platelets: 181 10*3/uL (ref 150–400)
RBC: 4.54 MIL/uL (ref 4.22–5.81)
RDW: 13.2 % (ref 11.5–15.5)
WBC: 4.3 10*3/uL (ref 4.0–10.5)
nRBC: 0 % (ref 0.0–0.2)

## 2022-06-01 LAB — COMPREHENSIVE METABOLIC PANEL
ALT: 22 U/L (ref 0–44)
AST: 35 U/L (ref 15–41)
Albumin: 4.1 g/dL (ref 3.5–5.0)
Alkaline Phosphatase: 67 U/L (ref 38–126)
Anion gap: 4 — ABNORMAL LOW (ref 5–15)
BUN: 21 mg/dL (ref 8–23)
CO2: 28 mmol/L (ref 22–32)
Calcium: 9.4 mg/dL (ref 8.9–10.3)
Chloride: 105 mmol/L (ref 98–111)
Creatinine, Ser: 0.75 mg/dL (ref 0.61–1.24)
GFR, Estimated: >60 mL/min (ref >60)
Glucose, Bld: 100 mg/dL — ABNORMAL HIGH (ref 70–99)
Potassium: 5.1 mmol/L (ref 3.5–5.1)
Sodium: 137 mmol/L (ref 135–145)
Total Bilirubin: 1 mg/dL (ref 0.3–1.2)
Total Protein: 7.3 g/dL (ref 6.5–8.1)

## 2022-06-01 LAB — HEMOGLOBIN A1C
Hgb A1c MFr Bld: 5.1 % (ref 4.8–5.6)
Mean Plasma Glucose: 99.67 mg/dL

## 2022-06-01 LAB — SEDIMENTATION RATE: Sed Rate: 12 mm/hr (ref 0–16)

## 2022-06-01 LAB — C-REACTIVE PROTEIN: CRP: <0.5 mg/dL (ref <1.0)

## 2022-06-01 LAB — HIV ANTIBODY (ROUTINE TESTING W REFLEX): HIV Screen 4th Generation wRfx: NONREACTIVE

## 2022-06-01 LAB — GLUCOSE, CAPILLARY: Glucose-Capillary: 95 mg/dL (ref 70–99)

## 2022-06-01 LAB — PREALBUMIN: Prealbumin: 21 mg/dL (ref 18–38)

## 2022-06-01 MED ORDER — OXYCODONE HCL 5 MG PO TABS: 5.0000 mg | ORAL_TABLET | ORAL | Status: DC | PRN

## 2022-06-01 MED ORDER — KETOROLAC TROMETHAMINE 15 MG/ML IJ SOLN
15.0000 mg | Freq: Four times a day (QID) | INTRAMUSCULAR | Status: AC | PRN
  Administered 2022-06-02 (×2): 15 mg via INTRAVENOUS
  Filled 2022-06-01 (×3): qty 1

## 2022-06-01 MED ORDER — AMLODIPINE-OLMESARTAN 5-40 MG PO TABS: 1.0000 | ORAL_TABLET | Freq: Every day | ORAL | Status: DC

## 2022-06-01 MED ORDER — OXYCODONE HCL 5 MG PO TABS
5.0000 mg | ORAL_TABLET | ORAL | Status: DC | PRN
  Administered 2022-06-02: 5 mg via ORAL
  Filled 2022-06-01 (×2): qty 1

## 2022-06-01 MED ORDER — HYDROMORPHONE HCL 1 MG/ML IJ SOLN
1.0000 mg | Freq: Once | INTRAMUSCULAR | Status: DC
  Filled 2022-06-01 (×2): qty 1

## 2022-06-01 MED ORDER — VANCOMYCIN HCL 2000 MG/400ML IV SOLN
2000.0000 mg | Freq: Once | INTRAVENOUS | Status: AC
  Administered 2022-06-01: 2000 mg via INTRAVENOUS
  Filled 2022-06-01: qty 400

## 2022-06-01 MED ORDER — HYDROMORPHONE HCL 1 MG/ML IJ SOLN
1.0000 mg | Freq: Once | INTRAMUSCULAR | Status: AC
  Administered 2022-06-01: 1 mg via INTRAVENOUS
  Filled 2022-06-01: qty 1

## 2022-06-01 MED ORDER — AMLODIPINE BESYLATE 5 MG PO TABS
5.0000 mg | ORAL_TABLET | Freq: Every day | ORAL | Status: DC
  Administered 2022-06-03 – 2022-06-04 (×2): 5 mg via ORAL
  Filled 2022-06-01 (×2): qty 1

## 2022-06-01 MED ORDER — KETOROLAC TROMETHAMINE 15 MG/ML IJ SOLN
15.0000 mg | Freq: Once | INTRAMUSCULAR | Status: DC
  Filled 2022-06-01 (×2): qty 1

## 2022-06-01 MED ORDER — DAPAGLIFLOZIN PROPANEDIOL 10 MG PO TABS
10.0000 mg | ORAL_TABLET | Freq: Every day | ORAL | Status: DC
  Administered 2022-06-03 – 2022-06-04 (×2): 10 mg via ORAL
  Filled 2022-06-01 (×4): qty 1

## 2022-06-01 MED ORDER — PIPERACILLIN-TAZOBACTAM 3.375 G IVPB 30 MIN
3.3750 g | Freq: Once | INTRAVENOUS | Status: AC
  Administered 2022-06-01: 3.375 g via INTRAVENOUS
  Filled 2022-06-01: qty 50

## 2022-06-01 MED ORDER — KETOROLAC TROMETHAMINE 15 MG/ML IJ SOLN
15.0000 mg | Freq: Once | INTRAMUSCULAR | Status: AC
  Administered 2022-06-01: 15 mg via INTRAVENOUS

## 2022-06-01 MED ORDER — HYDROMORPHONE HCL 1 MG/ML IJ SOLN
1.0000 mg | Freq: Once | INTRAMUSCULAR | Status: AC
  Administered 2022-06-01: 1 mg via INTRAVENOUS

## 2022-06-01 MED ORDER — SODIUM CHLORIDE 0.9 % IV SOLN
2.0000 g | Freq: Three times a day (TID) | INTRAVENOUS | Status: DC
  Administered 2022-06-01 – 2022-06-04 (×9): 2 g via INTRAVENOUS
  Filled 2022-06-01 (×9): qty 12.5

## 2022-06-01 MED ORDER — VANCOMYCIN HCL 750 MG/150ML IV SOLN
750.0000 mg | Freq: Two times a day (BID) | INTRAVENOUS | Status: DC
  Administered 2022-06-02: 750 mg via INTRAVENOUS
  Filled 2022-06-01: qty 150

## 2022-06-01 MED ORDER — IRBESARTAN 300 MG PO TABS
300.0000 mg | ORAL_TABLET | Freq: Every day | ORAL | Status: DC
  Administered 2022-06-03: 300 mg via ORAL
  Filled 2022-06-01: qty 1

## 2022-06-01 MED ORDER — METRONIDAZOLE 500 MG PO TABS
500.0000 mg | ORAL_TABLET | Freq: Two times a day (BID) | ORAL | Status: DC
  Administered 2022-06-01 – 2022-06-04 (×5): 500 mg via ORAL
  Filled 2022-06-01 (×5): qty 1

## 2022-06-01 MED ORDER — ONDANSETRON HCL 4 MG/2ML IJ SOLN
4.0000 mg | Freq: Once | INTRAMUSCULAR | Status: DC
  Filled 2022-06-01: qty 2

## 2022-06-01 MED ORDER — ROSUVASTATIN CALCIUM 5 MG PO TABS
5.0000 mg | ORAL_TABLET | Freq: Every evening | ORAL | Status: DC
  Administered 2022-06-01 – 2022-06-03 (×3): 5 mg via ORAL
  Filled 2022-06-01 (×3): qty 1

## 2022-06-01 NOTE — Progress Notes (Signed)
Pharmacy Antibiotic Note  Robert [name] is a 66 y.o. male with DM who presented to the ED on 06/01/2022 with c/o redness and welling of wound in the distal tip of the left fourth toe.  He was prescribed doxycycline PTA by his podiatrist Dr. Clayburn Pert for this DM wound but it has not improved.  Patient was instructed by Dr. Logan Bores to come to the hospital for further workup and management of wound/ulcer.  Pharmacy has been consulted to dose vancomycin and cefepime for infection.  Today, 06/01/2022: - scr 0.75 (crcl~100) - wbc wnl  - zosyn x1 dose given in the ED   Plan: - cefepime 2gm IV q8h - vancomycin 2000 mg IV x1, then 1250 mg IV q12h for est AUC 421 - flagyl 500 mg q12h per MD  __________________________________________  Height: 5\' 11"  (180.3 cm) Weight: (!) 143.8 kg (317 lb) IBW/kg (Calculated) : 75.3  Temp (24hrs), Avg:98.3 F (36.8 C), Min:98.3 F (36.8 C), Max:98.3 F (36.8 C)  Recent Labs  Lab 06/01/22 1304 06/01/22 1420  WBC 4.3  --   CREATININE  --  0.75    Estimated Creatinine Clearance: 128.4 mL/min (by C-G formula based on SCr of 0.75 mg/dL).    No Known Allergies   Thank you for allowing pharmacy to be a part of this patient's care.  06/03/22 06/01/2022 4:22 PM

## 2022-06-01 NOTE — Progress Notes (Signed)
[facility] admitting provider addendum:  Radiology preliminary MRI report is showing osteomyelitis as associated with his left diabetic foot.  Full report to follow.  Sanda Klein, MD.

## 2022-06-01 NOTE — H&P (Signed)
History and Physical    Patient: Robert [name] SWH:[id number] DOB: 1956-05-31 DOA: 06/01/2022 DOS: the patient was seen and examined on 06/01/2022 PCP: [facility]  Patient coming from: Home  Chief Complaint:  Chief Complaint  Patient presents with   Wound Infection   HPI: Robert [name] is a 66 y.o. male with medical history significant of allergic rhinitis, sore Tritus, asthma, chronic back pain, GERD, history of her esophagus, depression, type 2 diabetes, class III obesity, history of prescription opioid abuse, OSA on CPAP who is coming to the emergency department due to progressively worse left lower extremity wound.  He saw Dr. Amalia Hailey, D.P.M. a week ago and was prescribed doxycycline.  He went today for follow-up and Dr. Amalia Hailey referred him to the emergency department.  He may have had a low-grade fever last night. He denied fever, chills, rhinorrhea, sore throat, wheezing or hemoptysis.  No chest pain, palpitations, diaphoresis, PND, orthopnea or pitting edema of the lower extremities.  No abdominal pain, nausea, emesis, diarrhea, constipation, melena or hematochezia.  No flank pain, dysuria, frequency or hematuria.  No polyuria, polydipsia, polyphagia or blurred vision.   ED course: Initial vital signs were temperature 98.3 F, pulse 66, respiration 20, BP 154/75 mmHg O2 sat 98% on room air.  The patient received Zosyn 3.375 g IVPB.  I added hydromorphone 1 mg IVP and Toradol 15 mg IVP.  Lab work: CBC was normal.  CMP showed a glucose of 100 mg/dL and an anion gap of 4.  The rest of the measurements were normal.   Imaging: Left foot x-ray with soft tissue swelling of the fourth toe without definite radiographic evidence of acute osteomyelitis.  Prior second and third toe resections at the PIP joint level.  MRI of the foot still pending.   Review of Systems: As mentioned in the history of present illness. All other systems reviewed and are negative. Past Medical  History:  Diagnosis Date   Allergic rhinitis due to pollen    Arthritis    Asthma    Back pain    Barrett esophagus    Depressive disorder    Diabetes mellitus type 2 in obese (HCC)    Exposure to hepatitis B    Exposure to hepatitis C    Narcotic abuse (HCC)    pain medications   OSA (obstructive sleep apnea)    on CPAP    Past Surgical History:  Procedure Laterality Date   ABDOMINAL SURGERY     AMPUTATION Right 06/11/2018   Procedure: RIGHT 2ND RAY AMPUTATION;  Surgeon: Newt Minion, MD;  Location: [facility];  Service: Orthopedics;  Laterality: Right;   BACK SURGERY     Rods and screws in lumbar area   COLONOSCOPY     ESOPHAGOGASTRODUODENOSCOPY  multiple   HERNIA REPAIR     6   JOINT REPLACEMENT     TOTAL KNEE ARTHROPLASTY  2009   left   Social History:  reports that he quit smoking about 17 years ago. His smoking use included cigarettes. He has a 12.00 pack-year smoking history. He has never used smokeless tobacco. He reports that he does not drink alcohol and does not use drugs.  No Known Allergies  Family History  Problem Relation Age of Onset   Allergies Mother    Early death Father    Thyroid disease Sister    Colon cancer Neg Hx    Stomach cancer Neg Hx     Prior to Admission  medications   Medication Sig Start Date End Date Taking? Authorizing Provider  albuterol (VENTOLIN HFA) 108 (90 Base) MCG/ACT inhaler INHALE 1-2 PUFFS BY MOUTH EVERY 6 HOURS AS NEEDED FOR WHEEZE OR SHORTNESS OF BREATH. Patient taking differently: Inhale 1 puff into the lungs every 6 (six) hours as needed for shortness of breath. 09/21/21  Yes Abonza, Maritza, PA-C  amLODipine-olmesartan (AZOR) 5-40 MG tablet Take 1 tablet by mouth daily. 11/22/21  Yes [provider]  APPLE CIDER VINEGAR PO Take 15 mLs by mouth 2 (two) times daily.    Yes [provider]  B Complex Vitamins (B COMPLEX 100 PO) Take 1 capsule by mouth daily.   Yes [provider]   CALCIUM-MAGNESIUM-ZINC PO Take 1 tablet by mouth every other day.   Yes [provider]  Cholecalciferol (VITAMIN D3) 5000 units TABS 5,000 IU OTC vitamin D3 daily. Patient taking differently: Take 5,000 Units by mouth daily. 05/08/16  Yes Opalski, Neoma Laming, DO  doxycycline (VIBRA-TABS) 100 MG tablet Take 1 tablet (100 mg total) by mouth 2 (two) times daily. 05/23/22  Yes Daylene Katayama M, DPM  FARXIGA 10 MG TABS tablet Take 10 mg by mouth daily. 11/03/21  Yes [provider]  GINSENG PO Take 1 tablet by mouth once a week.   Yes [provider]  meloxicam (MOBIC) 15 MG tablet TAKE 1 TABLET BY MOUTH EVERY DAY Patient taking differently: Take 15 mg by mouth daily. 02/13/21  Yes Abonza, Maritza, PA-C  mupirocin ointment (BACTROBAN) 2 % Apply 1 application topically 2 (two) times daily. 05/15/21  Yes Lorenda Peck, DPM  NARCAN 4 MG/0.1ML LIQD nasal spray kit Place 1 spray into the nose daily as needed (overdose). 08/19/18  Yes [provider]  ondansetron (ZOFRAN) 4 MG tablet Take 1 tablet (4 mg total) by mouth every 8 (eight) hours as needed for nausea or vomiting. 12/26/21  Yes Sikora, Rebecca, DPM  OZEMPIC, 0.25 OR 0.5 MG/DOSE, 2 MG/1.5ML SOPN INJECT 0.25MG INTO SKIN ONCE WEEKLY FOR 4 WEEKS, THEN INJECT 0.5MG INTO SKIN ONCE WEEKLY. Patient taking differently: Inject 2 mg into the skin once a week. Inject 0.43m into skin once weekly for 4 weeks, then inject 0.534minto skin once weekly. 02/20/21  Yes AbLorrene ReidPA-C  Potassium 99 MG TABS Take 99 mg by mouth daily.   Yes [provider]  rosuvastatin (CRESTOR) 5 MG tablet Take 5 mg by mouth every evening. 10/27/21  Yes [provider]  traMADol (ULTRAM) 50 MG tablet Take 1 tablet (50 mg total) by mouth every 4 (four) hours as needed for up to 5 days. Patient taking differently: Take 50 mg by mouth every 6 (six) hours as needed for moderate pain. 05/29/22 06/03/22 Yes EvEdrick KinsDPM  TURMERIC  CURCUMIN PO Take 1 tablet by mouth every other day.   Yes [provider]  valACYclovir (VALTREX) 1000 MG tablet TAKE 1 TABLET BY MOUTH DAILY AS NEEDED (FOR OUTBREAKS ONLY). Patient taking differently: Take 500 mg by mouth daily as needed (For outbreaks). 03/06/21  Yes Abonza, Maritza, PA-C  vitamin B-12 1000 MCG tablet Take 1 tablet (1,000 mcg total) by mouth daily. 04/10/18  Yes Sheikh, Omair Latif, DO  vitamin C (ASCORBIC ACID) 500 MG tablet Take 500 mg by mouth daily.   Yes [provider]  VITAMIN E PO Take 1 tablet by mouth daily.   Yes [provider]  blood glucose meter kit and supplies Dispense based on patient and insurance preference.  Use to check glucose fasting in the AM and then after largest meal of the day.. (FOR ICD-10 E10.9, E11.9). 12/04/17   Opalski, Neoma Laming, DO  Blood Glucose Monitoring Suppl (ONE TOUCH ULTRA MINI) w/Device KIT Use to test blood sugar, test in the morning fasting and test after largest meal of the day. 05/28/17   Opalski, Neoma Laming, DO  gentamicin cream (GARAMYCIN) 0.1 % Apply 1 Application topically 2 (two) times daily. Patient not taking: Reported on 06/01/2022 04/23/22   Edrick Kins, DPM  glucose blood (ONE TOUCH ULTRA TEST) test strip Use to test blood sugar, test in the morning fasting and test after largest meal of the day. 05/28/17   Opalski, Deborah, DO  LAGEVRIO 200 MG CAPS capsule Take 4 capsules by mouth 2 times daily at 12 noon and 4 pm. Patient not taking: Reported on 06/01/2022 09/11/21   [provider]  Olmesartan-amLODIPine-HCTZ 20-5-12.5 MG TABS TAKE 1 TABLET BY MOUTH EVERY DAY Patient not taking: Reported on 06/01/2022 02/13/21   Lorrene Reid, PA-C  ondansetron (ZOFRAN) 4 MG tablet Take 1 tablet (4 mg total) by mouth every 8 (eight) hours as needed for nausea or vomiting. Patient not taking: Reported on 06/01/2022 03/06/22   Lorenda Peck, DPM  [facility] DELICA LANCETS 35W MISC Use for testing blood sugar,  test once in the morning fasting and test after largest meal of the day. 05/28/17   [name], DO    Physical Exam: Vitals:   06/01/22 1247 06/01/22 1415 06/01/22 1430 06/01/22 1505  BP:  117/67 110/65 123/76  Pulse:  62 60 (!) 54  Resp:  19  19  Temp:      SpO2:  97% 96% 98%  Weight: (!) 143.8 kg     Height: _0  (1.803 m)      Physical Exam Vitals and nursing note reviewed.  Constitutional:      Appearance: Normal appearance. He is obese. He is not diaphoretic.  HENT:     Head: Normocephalic.     Nose: No rhinorrhea.     Mouth/Throat:     Mouth: Mucous membranes are moist.  Eyes:     General: No scleral icterus.    Pupils: Pupils are equal, round, and reactive to light.  Cardiovascular:     Rate and Rhythm: Normal rate and regular rhythm.  Pulmonary:     Effort: Pulmonary effort is normal.     Breath sounds: Normal breath sounds. No wheezing, rhonchi or rales.  Abdominal:     General: Bowel sounds are normal. There is no distension.     Palpations: Abdomen is soft.     Tenderness: There is no abdominal tenderness.  Musculoskeletal:     Cervical back: Neck supple.     Right lower leg: No edema.     Left lower leg: No edema.  Skin:    General: Skin is warm and dry.     Findings: Erythema and lesion present.     Comments: Left fourth toe wound with edema, surrounding erythema, calor and TTP.  Please see pictures below.  Neurological:     General: No focal deficit present.     Mental Status: He is alert and oriented to person, place, and time.  Psychiatric:        Mood and Affect: Mood normal.        Data Reviewed:  Results are pending, will review when available.  Assessment and Plan: Principal Problem:   Type 2 diabetes mellitus with complication,  without long-term current use of insulin ([place]) Presenting with:   Diabetic foot infection ([place]) Admit to telemetry/inpatient. Continue IV fluids. Carbohydrate modified diet. CBG monitoring before  meals and bedtime. Check ESR, CRP, prealbumin and hemoglobin A1c. Continue cefepime 2 g every 8 hours.   Continue metronidazole 500 mg oral q 12 hr. Continue vancomycin per pharmacy. Follow-up blood culture and sensitivity Follow CBC and CMP in a.m. Analgesics as needed. MRI report still pending. Podiatry will see in the morning.  Active Problems:   Chronic back pain On analgesics.    OSA on CPAP Continue CPAP at bedtime.    Restrictive lung disease secondary to obesity On CPAP at bedtime. Continue weight loss.    Hypertension associated with diabetes (HCC) Continue amlodipine 5 mg p.o. daily. Continue olmesartan 40 mg p.o. daily. Monitor BP, HR, renal function electrolytes.    Hyperlipidemia On rosuvastatin 5 mg p.o. every evening.    Advance Care Planning:   Code Status: Full Code   Consults: Podiatry (Dr. Sherryle Lis).  Family Communication: His daughter and spouse were at bedside.  Severity of Illness: The appropriate patient status for this patient is INPATIENT. Inpatient status is judged to be reasonable and necessary in order to provide the required intensity of service to ensure the patient's safety. The patient's presenting symptoms, physical exam findings, and initial radiographic and laboratory data in the context of their chronic comorbidities is felt to place them at high risk for further clinical deterioration. Furthermore, it is not anticipated that the patient will be medically stable for discharge from the hospital within 2 midnights of admission.   * I certify that at the point of admission it is my clinical judgment that the patient will require inpatient hospital care spanning beyond 2 midnights from the point of admission due to high intensity of service, high risk for further deterioration and high frequency of surveillance required.*  Author: Reubin Milan, MD 06/01/2022 4:18 PM  For on call review [email or web address].   This document was prepared  using Dragon voice recognition software and may contain some unintended transcription errors.

## 2022-06-01 NOTE — ED Triage Notes (Signed)
Pt arrived via POV. Pt has hx of osteomyelitis, with previous amputations on both feet. Toe on L foot red, swollen, and painful for 3x weeks.  AOx4

## 2022-06-01 NOTE — Progress Notes (Signed)
   Chief Complaint  Patient presents with   Foot Ulcer    Patient is here for left foot 4th toe.patient states that healing process is slow.    Subjective:  66 y.o. male with PMHx of diabetes mellitus presenting today for follow-up evaluation of acute onset of redness with swelling and a wound that developed to the distal tip of the left fourth toe.  Patient has been taking oral doxycycline and applying the mupirocin as well as wearing the postsurgical shoe.  He was last seen here in the office 05/23/2022.  Unfortunately the patient's toe has not improved and he is concerned for bone infection due to the worsening swelling and redness to the toe.   Past Medical History:  Diagnosis Date   Allergic rhinitis due to pollen    Arthritis    Asthma    Back pain    Barrett esophagus    Depressive disorder    Diabetes mellitus type 2 in obese (HCC)    Exposure to hepatitis B    Exposure to hepatitis C    Narcotic abuse (HCC)    pain medications   OSA (obstructive sleep apnea)    on CPAP     Past Surgical History:  Procedure Laterality Date   ABDOMINAL SURGERY     AMPUTATION Right 06/11/2018   Procedure: RIGHT 2ND RAY AMPUTATION;  Surgeon: Nadara Mustard, MD;  Location: [facility] OR;  Service: Orthopedics;  Laterality: Right;   BACK SURGERY     Rods and screws in lumbar area   COLONOSCOPY     ESOPHAGOGASTRODUODENOSCOPY  multiple   HERNIA REPAIR     6   JOINT REPLACEMENT     TOTAL KNEE ARTHROPLASTY  2009   left    No Known Allergies    06/01/2022    05/23/2022   Objective/Physical Exam General: The patient is alert and oriented x3 in no acute distress.  Unfortunately there is worsening redness and erythema with deteriorating ulcer to the distal tip of the toe despite oral and topical antibiotics.  Concern for osteomyelitis with worsening cellulitis of the toe.  No malodor.  Assessment: 1.  Ulcer distal tip of the left fourth toe secondary to diabetes mellitus 2. diabetes  mellitus w/ peripheral neuropathy 3.  Cellulitis left fourth toe   Plan of Care:  1. Patient was evaluated. 2.  Due to the worsening erythema and edema of the toe and deteriorating clinical appearance I do recommend that the patient go to the emergency department for admission and IV antibiotics with likely amputation of the toe.  Recommend MRI upon admission. 3.  Patient planning to go to [facility] in [place].  Please consult on-call podiatry once admitted.  Felecia Shelling, DPM [facility]  Dr. Felecia Shelling, DPM    [address] [place], [place] [address]                Office [phone]  Fax [phone]

## 2022-06-01 NOTE — ED Provider Notes (Signed)
[facility] DEPT Provider Note   CSN: [id number] Arrival date & time: 06/01/22  1233     History  Chief Complaint  Patient presents with   Wound Infection    Robert [name] is a 66 y.o. male.  66 year old male with prior medical history as detailed below presents for evaluation.  Patient with chronic wound to the left fourth digit on the left foot.  Patient is followed by podiatry for same.  This wound is been present for at least 3 weeks.  Wound has progressed.  Patient was sent from podiatrist office -Dr. Amalia Hailey -to be admitted.  Patient will require IV antibiotics and possible amputation.    The history is provided by the patient and medical records.       Home Medications Prior to Admission medications   Medication Sig Start Date End Date Taking? Authorizing Provider  albuterol (VENTOLIN HFA) 108 (90 Base) MCG/ACT inhaler INHALE 1-2 PUFFS BY MOUTH EVERY 6 HOURS AS NEEDED FOR WHEEZE OR SHORTNESS OF BREATH. 09/21/21   Abonza, Maritza, PA-C  amLODipine-olmesartan (AZOR) 5-40 MG tablet Take 1 tablet by mouth daily. 11/22/21   [provider]  APPLE CIDER VINEGAR PO Take 15 mLs by mouth 2 (two) times daily.     [provider]  B Complex Vitamins (B COMPLEX 100 PO) Take 1 capsule by mouth every other day.     [provider]  blood glucose meter kit and supplies Dispense based on patient and insurance preference. Use to check glucose fasting in the AM and then after largest meal of the day.. (FOR ICD-10 E10.9, E11.9). 12/04/17   Opalski, Neoma Laming, DO  Blood Glucose Monitoring Suppl (ONE TOUCH ULTRA MINI) w/Device KIT Use to test blood sugar, test in the morning fasting and test after largest meal of the day. 05/28/17   Opalski, Deborah, DO  CALCIUM-MAGNESIUM-ZINC PO Take 1 tablet by mouth every other day.    [provider]  Cholecalciferol (VITAMIN D3) 5000 units TABS 5,000 IU OTC vitamin D3 daily. Patient taking  differently: Take 5,000 Units by mouth daily. 05/08/16   Opalski, Deborah, DO  Colchicine 0.6 MG CAPS PLEASE SEE ATTACHED FOR DETAILED DIRECTIONS 11/22/21   [provider]  DANDELION PO Take 1 tablet by mouth every other day.    [provider]  doxycycline (VIBRA-TABS) 100 MG tablet Take 1 tablet (100 mg total) by mouth 2 (two) times daily. 05/23/22   Edrick Kins, DPM  FARXIGA 10 MG TABS tablet Take 10 mg by mouth daily. 11/03/21   [provider]  gentamicin cream (GARAMYCIN) 0.1 % Apply 1 Application topically 2 (two) times daily. 04/23/22   Edrick Kins, DPM  glucose blood (ONE TOUCH ULTRA TEST) test strip Use to test blood sugar, test in the morning fasting and test after largest meal of the day. 05/28/17   [name], DO  LAGEVRIO 200 MG CAPS capsule SMARTSIG:4 Capsule(s) By Mouth Every 12 Hours 09/11/21   [provider]  meloxicam (MOBIC) 15 MG tablet TAKE 1 TABLET BY MOUTH EVERY DAY 02/13/21   Abonza, Maritza, PA-C  meloxicam (MOBIC) 15 MG tablet Take 1 tablet by mouth daily.    [provider]  mupirocin ointment (BACTROBAN) 2 % Apply 1 application topically 2 (two) times daily. 05/15/21   Lorenda Peck, DPM  NARCAN 4 MG/0.1ML LIQD nasal spray kit 1 SPRAY AS NEEDED 08/19/18   [provider]  Olmesartan-amLODIPine-HCTZ 20-5-12.5 MG TABS TAKE 1 TABLET BY  MOUTH EVERY DAY 02/13/21   Lorrene Reid, PA-C  ondansetron (ZOFRAN) 4 MG tablet Take 1 tablet (4 mg total) by mouth every 8 (eight) hours as needed for nausea or vomiting. 12/26/21   Lorenda Peck, DPM  ondansetron (ZOFRAN) 4 MG tablet Take 1 tablet (4 mg total) by mouth every 8 (eight) hours as needed for nausea or vomiting. 03/06/22   Lorenda Peck, DPM  ONETOUCH DELICA LANCETS 91Y MISC Use for testing blood sugar, test once in the morning fasting and test after largest meal of the day. 05/28/17   Opalski, Deborah, DO  OZEMPIC, 0.25 OR 0.5 MG/DOSE, 2 MG/1.5ML SOPN INJECT 0.25MG  INTO SKIN ONCE WEEKLY FOR 4 WEEKS, THEN INJECT 0.5MG INTO SKIN ONCE WEEKLY. 02/20/21   Lorrene Reid, PA-C  Potassium 99 MG TABS Take 99 mg by mouth every other day.     [provider]  rosuvastatin (CRESTOR) 5 MG tablet Take 5 mg by mouth daily. 10/27/21   [provider]  traMADol (ULTRAM) 50 MG tablet Take 1 tablet (50 mg total) by mouth every 4 (four) hours as needed for up to 5 days. 05/29/22 06/03/22  Edrick Kins, DPM  TURMERIC CURCUMIN PO Take 1 tablet by mouth every other day.    [provider]  valACYclovir (VALTREX) 1000 MG tablet TAKE 1 TABLET BY MOUTH DAILY AS NEEDED (FOR OUTBREAKS ONLY). 03/06/21   Lorrene Reid, PA-C  vitamin B-12 1000 MCG tablet Take 1 tablet (1,000 mcg total) by mouth daily. 04/10/18   Raiford Noble Latif, DO  vitamin C (ASCORBIC ACID) 500 MG tablet Take 500 mg by mouth daily.    [provider]  VITAMIN E PO Take 1 tablet by mouth daily.    [provider]      Allergies    Patient has no known allergies.    Review of Systems   Review of Systems  All other systems reviewed and are negative.   Physical Exam Updated Vital Signs BP (!) 154/75 (BP Location: Left Arm)   Pulse 66   Temp 98.3 F (36.8 C)   Resp 20   Ht _0  (1.803 m)   Wt (!) 143.8 kg   SpO2 100%   BMI 44.21 kg/m  Physical Exam Vitals and nursing note reviewed.  Constitutional:      General: He is not in acute distress.    Appearance: Normal appearance. He is well-developed.  HENT:     Head: Normocephalic and atraumatic.  Eyes:     Conjunctiva/sclera: Conjunctivae normal.     Pupils: Pupils are equal, round, and reactive to light.  Cardiovascular:     Rate and Rhythm: Normal rate and regular rhythm.     Heart sounds: Normal heart sounds.  Pulmonary:     Effort: Pulmonary effort is normal. No respiratory distress.     Breath sounds: Normal breath sounds.  Abdominal:     General: There is no distension.     Palpations: Abdomen  is soft.     Tenderness: There is no abdominal tenderness.  Musculoskeletal:        General: No deformity. Normal range of motion.     Cervical back: Normal range of motion and neck supple.     Comments: Chronic wound of the left fourth digit on the left foot  Skin:    General: Skin is warm and dry.  Neurological:     General: No focal deficit present.     Mental Status: He is alert  and oriented to person, place, and time.     ED Results / Procedures / Treatments   Labs (all labs ordered are listed, but only abnormal results are displayed) Labs Reviewed  CBC  COMPREHENSIVE METABOLIC PANEL    EKG None  Radiology DG Foot Complete Left  Result Date: 06/01/2022 CLINICAL DATA:  Fourth toe wound EXAM: LEFT FOOT - COMPLETE 3+ VIEW COMPARISON:  05/23/2022 FINDINGS: Prior second and third toe resections at the PIP joint level. No acute fracture or dislocation. Hallux valgus deformity. No definite radiographic evidence of acute osteomyelitis, with attention to the fourth toe. Evaluation is slightly limited by joint flexion. There is soft tissue swelling of the fourth toe. No soft tissue gas is seen. IMPRESSION: 1. Soft tissue swelling of the fourth toe without definite radiographic evidence of acute osteomyelitis. 2. Prior second and third toe resections at the PIP joint level. Electronically Signed   By: Davina Poke D.O.   On: 06/01/2022 13:35    Procedures Procedures    Medications Ordered in ED Medications  piperacillin-tazobactam (ZOSYN) IVPB 3.375 g (has no administration in time range)    ED Course/ Medical Decision Making/ A&P                           Medical Decision Making Amount and/or Complexity of Data Reviewed Labs: ordered. Radiology: ordered.  Risk Prescription drug management.    Medical Screen Complete  This patient presented to the ED with complaint of diabetic foot infection.  This complaint involves an extensive number of treatment options. The  initial differential diagnosis includes, but is not limited to, diabetic foot infection  This presentation is: Acute, Self-Limited, Previously Undiagnosed, Uncertain Prognosis, Complicated, and Systemic Symptoms  Patient is presenting for admission for IV antibiotics and possible toe amputation.  Patient with known diabetic foot infection followed by podiatry.  Patient's wound is not improving with IV antibiotics and patient was sent for admission.  Hospitalist service made aware of case and will evaluate for admission.  Additional history obtained:  External records from outside sources obtained and reviewed including prior ED visits and prior Inpatient records.    Lab Tests:  I ordered and personally interpreted labs.  The pertinent results include: CBC, CMP   Imaging Studies ordered:  I ordered imaging studies including plain films of the left foot, MRI left foot I independently visualized and interpreted obtained imaging which showed no clear evidence of osteo on plain film I agree with the radiologist interpretation.   Cardiac Monitoring:  The patient was maintained on a cardiac monitor.  I personally viewed and interpreted the cardiac monitor which showed an underlying rhythm of: NSR   Medicines ordered:  I ordered medication including Zosyn for diabetic foot infection Reevaluation of the patient after these medicines showed that the patient: stayed the same Problem List / ED Course:  Diabetic foot infection   Reevaluation:  After the interventions noted above, I reevaluated the patient and found that they have: stayed the same  Disposition:  After consideration of the diagnostic results and the patients response to treatment, I feel that the patent would benefit from admission.          Final Clinical Impression(s) / ED Diagnoses Final diagnoses:  Diabetic foot infection [facility]    Rx / DC Orders ED Discharge Orders     None         Valarie Merino, MD 06/01/22 1359

## 2022-06-01 NOTE — ED Provider Triage Note (Signed)
Emergency Medicine Provider Triage Evaluation Note  Robert [name] , a 66 y.o. male  was evaluated in triage.  Pt complains of wound to the left fourth digit of the left foot.  States that it has been there for about 3 weeks.  Started with a wound to the plantar surface of the toe and has progressed.  States for about a week it has been progressively more red and swollen.  He denies having any fevers but has had decreased appetite.  He does see podiatry.  States that he saw them last week on 05/23/2022 and it was not this red and swollen.  Called today and they told him to present to the emergency department for IV antibiotics and likely amputation..  Review of Systems  Positive: \See above Negative:   Physical Exam  BP (!) 154/75 (BP Location: Left Arm)   Pulse 66   Temp 98.3 F (36.8 C)   Resp 20   Ht 5\' 11"  (1.803 m)   Wt (!) 143.8 kg   SpO2 100%   BMI 44.21 kg/m  Gen:   Awake, no distress, nonseptic in appearance Resp:  Normal effort  MSK:   Moves extremities without difficulty  Other:  Left fourth digit of the left foot is circumferentially red, swollen, hot.  There is a wound at the distal tip of the fourth toe.  Pedal pulse 2+.  Sensation intact.  There is no streaking or redness going up the foot.  Medical Decision Making  Medically screening exam initiated at 1:13 PM.  Appropriate orders placed.  Robert [name] was informed that the remainder of the evaluation will be completed by another provider, this initial triage assessment does not replace that evaluation, and the importance of remaining in the ED until their evaluation is complete.     [name], PA-C 06/01/22 1315

## 2022-06-02 ENCOUNTER — Encounter (HOSPITAL_COMMUNITY): Payer: Self-pay | Admitting: Certified Registered"

## 2022-06-02 ENCOUNTER — Other Ambulatory Visit: Payer: Self-pay

## 2022-06-02 ENCOUNTER — Inpatient Hospital Stay (HOSPITAL_COMMUNITY): Payer: Medicare Other | Admitting: Anesthesiology

## 2022-06-02 ENCOUNTER — Inpatient Hospital Stay (HOSPITAL_COMMUNITY): Payer: Medicare Other

## 2022-06-02 ENCOUNTER — Encounter (HOSPITAL_COMMUNITY)
Admission: EM | Disposition: A | Discharge status: Home / Self Care | Payer: Self-pay | Source: Home / Self Care | Attending: Internal Medicine

## 2022-06-02 DIAGNOSIS — M86172 Other acute osteomyelitis, left ankle and foot: Secondary | ICD-10-CM

## 2022-06-02 DIAGNOSIS — M545 Low back pain, unspecified: Secondary | ICD-10-CM | POA: Diagnosis not present

## 2022-06-02 DIAGNOSIS — I509 Heart failure, unspecified: Secondary | ICD-10-CM | POA: Diagnosis not present

## 2022-06-02 DIAGNOSIS — E119 Type 2 diabetes mellitus without complications: Secondary | ICD-10-CM | POA: Diagnosis present

## 2022-06-02 DIAGNOSIS — E785 Hyperlipidemia, unspecified: Secondary | ICD-10-CM

## 2022-06-02 DIAGNOSIS — I2510 Atherosclerotic heart disease of native coronary artery without angina pectoris: Secondary | ICD-10-CM

## 2022-06-02 DIAGNOSIS — E11628 Type 2 diabetes mellitus with other skin complications: Secondary | ICD-10-CM | POA: Diagnosis not present

## 2022-06-02 DIAGNOSIS — G4733 Obstructive sleep apnea (adult) (pediatric): Secondary | ICD-10-CM

## 2022-06-02 DIAGNOSIS — M869 Osteomyelitis, unspecified: Secondary | ICD-10-CM | POA: Diagnosis not present

## 2022-06-02 DIAGNOSIS — M009 Pyogenic arthritis, unspecified: Secondary | ICD-10-CM

## 2022-06-02 DIAGNOSIS — Z87891 Personal history of nicotine dependence: Secondary | ICD-10-CM

## 2022-06-02 DIAGNOSIS — E118 Type 2 diabetes mellitus with unspecified complications: Secondary | ICD-10-CM | POA: Diagnosis present

## 2022-06-02 DIAGNOSIS — I110 Hypertensive heart disease with heart failure: Secondary | ICD-10-CM | POA: Diagnosis not present

## 2022-06-02 DIAGNOSIS — L089 Local infection of the skin and subcutaneous tissue, unspecified: Secondary | ICD-10-CM

## 2022-06-02 DIAGNOSIS — I10 Essential (primary) hypertension: Secondary | ICD-10-CM

## 2022-06-02 DIAGNOSIS — G8929 Other chronic pain: Secondary | ICD-10-CM

## 2022-06-02 HISTORY — PX: AMPUTATION TOE: SHX6595

## 2022-06-02 LAB — CBC WITH DIFFERENTIAL/PLATELET
Abs Immature Granulocytes: 0 10*3/uL (ref 0.00–0.07)
Basophils Absolute: 0 10*3/uL (ref 0.0–0.1)
Basophils Relative: 1 %
Eosinophils Absolute: 0.2 10*3/uL (ref 0.0–0.5)
Eosinophils Relative: 5 %
HCT: 39.7 % (ref 39.0–52.0)
Hemoglobin: 13.3 g/dL (ref 13.0–17.0)
Immature Granulocytes: 0 %
Lymphocytes Relative: 20 %
Lymphs Abs: 0.8 10*3/uL (ref 0.7–4.0)
MCH: 31 pg (ref 26.0–34.0)
MCHC: 33.5 g/dL (ref 30.0–36.0)
MCV: 92.5 fL (ref 80.0–100.0)
Monocytes Absolute: 0.5 10*3/uL (ref 0.1–1.0)
Monocytes Relative: 12 %
Neutro Abs: 2.4 10*3/uL (ref 1.7–7.7)
Neutrophils Relative %: 62 %
Platelets: 157 10*3/uL (ref 150–400)
RBC: 4.29 MIL/uL (ref 4.22–5.81)
RDW: 13.4 % (ref 11.5–15.5)
WBC: 3.8 10*3/uL — ABNORMAL LOW (ref 4.0–10.5)
nRBC: 0 % (ref 0.0–0.2)

## 2022-06-02 LAB — COMPREHENSIVE METABOLIC PANEL
ALT: 21 U/L (ref 0–44)
AST: 21 U/L (ref 15–41)
Albumin: 3.8 g/dL (ref 3.5–5.0)
Alkaline Phosphatase: 66 U/L (ref 38–126)
Anion gap: 4 — ABNORMAL LOW (ref 5–15)
BUN: 20 mg/dL (ref 8–23)
CO2: 27 mmol/L (ref 22–32)
Calcium: 9.1 mg/dL (ref 8.9–10.3)
Chloride: 109 mmol/L (ref 98–111)
Creatinine, Ser: 0.73 mg/dL (ref 0.61–1.24)
GFR, Estimated: >60 mL/min (ref >60)
Glucose, Bld: 103 mg/dL — ABNORMAL HIGH (ref 70–99)
Potassium: 4.7 mmol/L (ref 3.5–5.1)
Sodium: 140 mmol/L (ref 135–145)
Total Bilirubin: 0.7 mg/dL (ref 0.3–1.2)
Total Protein: 6.9 g/dL (ref 6.5–8.1)

## 2022-06-02 LAB — GLUCOSE, CAPILLARY
Glucose-Capillary: 91 mg/dL (ref 70–99)
Glucose-Capillary: 101 mg/dL — ABNORMAL HIGH (ref 70–99)
Glucose-Capillary: 93 mg/dL (ref 70–99)
Glucose-Capillary: 72 mg/dL (ref 70–99)
Glucose-Capillary: 93 mg/dL (ref 70–99)

## 2022-06-02 LAB — MAGNESIUM: Magnesium: 2.2 mg/dL (ref 1.7–2.4)

## 2022-06-02 LAB — APTT: aPTT: 32 seconds (ref 24–36)

## 2022-06-02 LAB — PROTIME-INR
INR: 1.1 (ref 0.8–1.2)
Prothrombin Time: 14.1 seconds (ref 11.4–15.2)

## 2022-06-02 SURGERY — AMPUTATION, TOE
Anesthesia: Monitor Anesthesia Care | Site: Toe | Laterality: Left

## 2022-06-02 MED ORDER — FENTANYL CITRATE (PF) 100 MCG/2ML IJ SOLN
INTRAMUSCULAR | Status: DC | PRN
  Administered 2022-06-02 (×3): 50 ug via INTRAVENOUS

## 2022-06-02 MED ORDER — INSULIN ASPART 100 UNIT/ML IJ SOLN: 0.0000 [IU] | Freq: Three times a day (TID) | INTRAMUSCULAR | Status: DC

## 2022-06-02 MED ORDER — LACTATED RINGERS IV SOLN: INTRAVENOUS | Status: DC | PRN

## 2022-06-02 MED ORDER — POLYETHYLENE GLYCOL 3350 17 G PO PACK
17.0000 g | PACK | Freq: Every day | ORAL | Status: DC
  Administered 2022-06-02 – 2022-06-04 (×3): 17 g via ORAL
  Filled 2022-06-02 (×2): qty 1

## 2022-06-02 MED ORDER — ACETAMINOPHEN 500 MG PO TABS
ORAL_TABLET | ORAL | Status: AC
  Filled 2022-06-02: qty 2

## 2022-06-02 MED ORDER — OXYCODONE HCL 5 MG PO TABS
5.0000 mg | ORAL_TABLET | ORAL | Status: DC | PRN
  Administered 2022-06-02 – 2022-06-03 (×2): 5 mg via ORAL
  Filled 2022-06-02 (×5): qty 1

## 2022-06-02 MED ORDER — FENTANYL CITRATE (PF) 100 MCG/2ML IJ SOLN
INTRAMUSCULAR | Status: AC
  Filled 2022-06-02: qty 2

## 2022-06-02 MED ORDER — VANCOMYCIN HCL 1000 MG IV SOLR
INTRAVENOUS | Status: AC
  Filled 2022-06-02: qty 20

## 2022-06-02 MED ORDER — METRONIDAZOLE 500 MG/100ML IV SOLN
INTRAVENOUS | Status: AC
  Filled 2022-06-02: qty 100

## 2022-06-02 MED ORDER — MELATONIN 5 MG PO TABS: 10.0000 mg | ORAL_TABLET | Freq: Every evening | ORAL | Status: DC | PRN

## 2022-06-02 MED ORDER — FENTANYL CITRATE PF 50 MCG/ML IJ SOSY: 25.0000 ug | PREFILLED_SYRINGE | INTRAMUSCULAR | Status: DC | PRN

## 2022-06-02 MED ORDER — OXYCODONE HCL 5 MG PO TABS
10.0000 mg | ORAL_TABLET | ORAL | Status: DC | PRN
  Administered 2022-06-03 – 2022-06-04 (×6): 10 mg via ORAL
  Filled 2022-06-02 (×5): qty 2

## 2022-06-02 MED ORDER — OXYCODONE HCL 5 MG PO TABS: 5.0000 mg | ORAL_TABLET | Freq: Once | ORAL | Status: DC | PRN

## 2022-06-02 MED ORDER — DEXMEDETOMIDINE HCL IN NACL 80 MCG/20ML IV SOLN: INTRAVENOUS | Status: DC | PRN

## 2022-06-02 MED ORDER — DEXAMETHASONE SODIUM PHOSPHATE 4 MG/ML IJ SOLN: INTRAMUSCULAR | Status: DC | PRN

## 2022-06-02 MED ORDER — ONDANSETRON HCL 4 MG/2ML IJ SOLN: 4.0000 mg | Freq: Four times a day (QID) | INTRAMUSCULAR | Status: DC | PRN

## 2022-06-02 MED ORDER — INSULIN ASPART 100 UNIT/ML IJ SOLN
0.0000 [IU] | Freq: Three times a day (TID) | INTRAMUSCULAR | Status: DC
  Administered 2022-06-04: 1 [IU] via SUBCUTANEOUS

## 2022-06-02 MED ORDER — MIDAZOLAM HCL 2 MG/2ML IJ SOLN
INTRAMUSCULAR | Status: AC
  Filled 2022-06-02: qty 2

## 2022-06-02 MED ORDER — SALINE SPRAY 0.65 % NA SOLN
1.0000 | NASAL | Status: DC | PRN
  Administered 2022-06-03: 1 via NASAL
  Filled 2022-06-02: qty 44

## 2022-06-02 MED ORDER — VANCOMYCIN HCL 1250 MG/250ML IV SOLN
1250.0000 mg | Freq: Two times a day (BID) | INTRAVENOUS | Status: DC
  Administered 2022-06-02 – 2022-06-04 (×4): 1250 mg via INTRAVENOUS
  Filled 2022-06-02 (×8): qty 250

## 2022-06-02 MED ORDER — BUPIVACAINE HCL (PF) 0.5 % IJ SOLN
INTRAMUSCULAR | Status: DC | PRN
  Administered 2022-06-02: 10 mL

## 2022-06-02 MED ORDER — BUPIVACAINE HCL (PF) 0.5 % IJ SOLN
INTRAMUSCULAR | Status: AC
  Filled 2022-06-02: qty 30

## 2022-06-02 MED ORDER — DEXMEDETOMIDINE HCL IN NACL 80 MCG/20ML IV SOLN
INTRAVENOUS | Status: DC | PRN
  Administered 2022-06-02: 8 ug via INTRAVENOUS
  Administered 2022-06-02 (×3): 4 ug via INTRAVENOUS

## 2022-06-02 MED ORDER — HYDRALAZINE HCL 20 MG/ML IJ SOLN: 10.0000 mg | Freq: Four times a day (QID) | INTRAMUSCULAR | Status: DC | PRN

## 2022-06-02 MED ORDER — OXYCODONE HCL 5 MG/5ML PO SOLN: 5.0000 mg | Freq: Once | ORAL | Status: DC | PRN

## 2022-06-02 MED ORDER — PHENYLEPHRINE HCL-NACL 20-0.9 MG/250ML-% IV SOLN
INTRAVENOUS | Status: DC | PRN
  Administered 2022-06-02: 25 ug/min via INTRAVENOUS

## 2022-06-02 MED ORDER — ACETAMINOPHEN 500 MG PO TABS
1000.0000 mg | ORAL_TABLET | Freq: Once | ORAL | Status: AC
  Administered 2022-06-02: 1000 mg via ORAL

## 2022-06-02 MED ORDER — 0.9 % SODIUM CHLORIDE (POUR BTL) OPTIME
TOPICAL | Status: DC | PRN
  Administered 2022-06-02: 1000 mL

## 2022-06-02 MED ORDER — MIDAZOLAM HCL 2 MG/2ML IJ SOLN
INTRAMUSCULAR | Status: DC | PRN
  Administered 2022-06-02 (×2): 1 mg via INTRAVENOUS

## 2022-06-02 SURGICAL SUPPLY — 39 items
BAG COUNTER SPONGE SURGICOUNT (BAG) IMPLANT
BAG SPNG CNTER NS LX DISP (BAG)
BLADE SURG 15 STRL LF DISP TIS (BLADE) IMPLANT
BLADE SURG 15 STRL SS (BLADE)
BNDG CONFORM 4 STRL LF (GAUZE/BANDAGES/DRESSINGS) ×1 IMPLANT
BNDG ELASTIC 4X5.8 VLCR STR LF (GAUZE/BANDAGES/DRESSINGS) IMPLANT
BNDG ELASTIC 6X5.8 VLCR STR LF (GAUZE/BANDAGES/DRESSINGS) ×1 IMPLANT
BNDG GAUZE DERMACEA FLUFF 4 (GAUZE/BANDAGES/DRESSINGS) IMPLANT
BNDG GZE DERMACEA 4 6PLY (GAUZE/BANDAGES/DRESSINGS) ×1
CLEANER TIP ELECTROSURG 2X2 (MISCELLANEOUS) IMPLANT
CNTNR URN SCR LID CUP LEK RST (MISCELLANEOUS) IMPLANT
CONT SPEC 4OZ STRL OR WHT (MISCELLANEOUS)
COVER SURGICAL LIGHT HANDLE (MISCELLANEOUS) ×1 IMPLANT
CUFF TOURN SGL QUICK 24 (TOURNIQUET CUFF)
CUFF TRNQT CYL 24X4X16.5-23 (TOURNIQUET CUFF) IMPLANT
GAUZE SPONGE 4X4 12PLY STRL (GAUZE/BANDAGES/DRESSINGS) ×1 IMPLANT
GAUZE XEROFORM 1X8 LF (GAUZE/BANDAGES/DRESSINGS) ×1 IMPLANT
GLOVE BIO SURGEON STRL SZ7.5 (GLOVE) ×1 IMPLANT
GLOVE BIOGEL M 7.0 STRL (GLOVE) ×1 IMPLANT
GLOVE BIOGEL PI IND STRL 7.5 (GLOVE) ×1 IMPLANT
GLOVE BIOGEL PI IND STRL 8 (GLOVE) ×1 IMPLANT
GLOVE ECLIPSE 8.0 STRL XLNG CF (GLOVE) ×1 IMPLANT
GOWN STRL REUS W/ TWL XL LVL3 (GOWN DISPOSABLE) ×1 IMPLANT
GOWN STRL REUS W/TWL XL LVL3 (GOWN DISPOSABLE) ×1
KIT BASIN OR (CUSTOM PROCEDURE TRAY) ×1 IMPLANT
NDL HYPO 25X1 1.5 SAFETY (NEEDLE) ×1 IMPLANT
NEEDLE HYPO 25X1 1.5 SAFETY (NEEDLE) ×1 IMPLANT
PACK ORTHO EXTREMITY (CUSTOM PROCEDURE TRAY) ×1 IMPLANT
PADDING UNDERCAST 2X4 STRL (CAST SUPPLIES) ×1 IMPLANT
PENCIL SMOKE EVACUATOR (MISCELLANEOUS) ×1 IMPLANT
SPIKE FLUID TRANSFER (MISCELLANEOUS) IMPLANT
STAPLER VISISTAT 35W (STAPLE) ×1 IMPLANT
SUT ETHILON 4 0 PS 2 18 (SUTURE) ×1 IMPLANT
SUT MNCRL AB 4-0 PS2 18 (SUTURE) IMPLANT
SUT VIC AB 4-0 PS2 27 (SUTURE) ×1 IMPLANT
SYR 20ML LL LF (SYRINGE) IMPLANT
TOWEL OR 17X26 10 PK STRL BLUE (TOWEL DISPOSABLE) ×1 IMPLANT
TOWEL OR NON WOVEN STRL DISP B (DISPOSABLE) ×1 IMPLANT
UNDERPAD 30X36 HEAVY ABSORB (UNDERPADS AND DIAPERS) ×2 IMPLANT

## 2022-06-02 NOTE — Progress Notes (Signed)
PROGRESS NOTE   Robert [name]  WCB:[id number] DOB: 10/17/1955 DOA: 06/01/2022 PCP: [facility]   Date of Service: the patient was seen and examined on 06/02/2022  Brief Narrative:  66 year old male with past medical history of asthma, obstructive sleep apnea on CPAP, diabetes mellitus type 2, chronic pain syndrome, osteomyelitis of the right second toe status post right second toe amputation by Dr. Lajoyce Corners 05/2018, hyperlipidemia, class III obesity with BMI 44, hepatitis C who presented to [facility] emergency department from podiatry clinic due to progressively worsening wound of the left foot.  Upon evaluation in the emergency department patient was felt to be suffering from a diabetic foot infection with extensive ulceration of the left fourth toe and surrounding cellulitis.  The hospitalist group was then called to assess the patient for admission the hospital.  Patient was admitted to hospital service and placed on intravenous antibiotics with cefepime vancomycin and metronidazole.  MRI of the left foot was performed confirming osteomyelitis.  Dr. Lilian Kapur with podiatry was consulted and recommended partial fourth toe amputation with patient to undergo operative intervention 11/18.   Assessment and Plan:  Problem  Diabetic Foot Infection (Hcc)  Patient status post partial amputation of the left fourth toe for osteomyelitis by Dr. Lilian Kapur Continuing broad-spectrum intravenous antibiotics Following operative cultures As needed opiate-based analgesics for substantial associated pain Podiatry continuing to follow, their assistance is appreciated   Osteomyelitis of Fourth Toe of Left Foot (Hcc)  Please see assessment and plan above   Essential hypertension  Resume patients home regimen of oral antihypertensives Titrate antihypertensive regimen as necessary to achieve adequate BP control PRN intravenous antihypertensives for excessively elevated blood  pressure   Type 2 Diabetes Mellitus With Complication, Without Long-Term Current Use of Insulin (Hcc)  Patient been placed on Accu-Cheks before every meal and nightly with sliding scale insulin Holding home regimen of hypoglycemics Hemoglobin A1C ordered Diabetic Diet   Hyperlipidemia  Continuing home regimen of lipid lowering therapy.   OSA on CPAP  CPAP nightly             Subjective:  Postoperatively patient is complaining of moderate pain to the left foot, sharp in quality, radiating proximally.  Physical Exam:  Vitals:   06/01/22 2001 06/01/22 2247 06/02/22 0111 06/02/22 0424  BP: 115/80  (!) 93/51 108/62  Pulse: 62 64 (!) 53 (!) 52  Resp: 18 18 18 18   Temp: 97.7 F (36.5 C)  (!) 97.5 F (36.4 C) 97.6 F (36.4 C)  TempSrc: Oral  Oral Oral  SpO2: 99% 97% 98% 99%  Weight:      Height:        Constitutional: Awake alert and oriented x3, no associated distress.   Skin: Dressing of left foot is clean dry and intact. Eyes: Pupils are equally reactive to light.  No evidence of scleral icterus or conjunctival pallor.  ENMT: Moist mucous membranes noted.  Posterior pharynx clear of any exudate or lesions.   Respiratory: clear to auscultation bilaterally, no wheezing, no crackles. Normal respiratory effort. No accessory muscle use.  Cardiovascular: Regular rate and rhythm, no murmurs / rubs / gallops. No extremity edema. 2+ pedal pulses. No carotid bruits.  Abdomen: Abdomen is soft and nontender.  No evidence of intra-abdominal masses.  Positive bowel sounds noted in all quadrants.   Musculoskeletal: Appropriate tenderness of the left foot.  Comments on dressing as noted above.   Data Reviewed:  I have personally reviewed and interpreted labs, imaging.  Significant findings are   CBC: Recent Labs  Lab 06/01/22 1304  WBC 4.3  HGB 13.8  HCT 41.2  MCV 90.7  PLT 181   Basic Metabolic Panel: Recent Labs  Lab 06/01/22 1420  NA 137  K 5.1  CL 105   CO2 28  GLUCOSE 100*  BUN 21  CREATININE 0.75  CALCIUM 9.4   GFR: Estimated Creatinine Clearance: 128.4 mL/min (by C-G formula based on SCr of 0.75 mg/dL). Liver Function Tests: Recent Labs  Lab 06/01/22 1420  AST 35  ALT 22  ALKPHOS 67  BILITOT 1.0  PROT 7.3  ALBUMIN 4.1     Code Status:  Full code.  Code status decision has been confirmed with: patient    Severity of Illness:  The appropriate patient status for this patient is INPATIENT. Inpatient status is judged to be reasonable and necessary in order to provide the required intensity of service to ensure the patient's safety. The patient's presenting symptoms, physical exam findings, and initial radiographic and laboratory data in the context of their chronic comorbidities is felt to place them at high risk for further clinical deterioration. Furthermore, it is not anticipated that the patient will be medically stable for discharge from the hospital within 2 midnights of admission.   * I certify that at the point of admission it is my clinical judgment that the patient will require inpatient hospital care spanning beyond 2 midnights from the point of admission due to high intensity of service, high risk for further deterioration and high frequency of surveillance required.*  Time spent:  50 minutes  Author:  Marinda Elk MD  06/02/2022 8:54 AM

## 2022-06-02 NOTE — Hospital Course (Signed)
66 year old male with past medical history of asthma, obstructive sleep apnea on CPAP, diabetes mellitus type 2, chronic pain syndrome, osteomyelitis of the right second toe status post right second toe amputation by Dr. Lajoyce Corners 05/2018, hyperlipidemia, class III obesity with BMI 44, hepatitis C who presented to [facility] emergency department from podiatry clinic due to progressively worsening wound of the left foot.  Upon evaluation in the emergency department patient was felt to be suffering from a diabetic foot infection with extensive ulceration of the left fourth toe and surrounding cellulitis.  The hospitalist group was then called to assess the patient for admission the hospital.  Patient was admitted to hospital service and placed on intravenous antibiotics with cefepime vancomycin and metronidazole.  MRI of the left foot was performed confirming osteomyelitis.  Dr. Lilian Kapur with podiatry was consulted and recommended partial fourth toe amputation.    Patient underwent successful partial amputation of the left fourth toe 11/18 without complication.  In the days that followed, patient clinically improved with decreasing pain redness and swelling.  Preliminary Gram stain revealed gram-positive cocci in pairs.  Due to substantial clinical improvement Dr. Allena Katz with podiatry recommended the patient may be discharged home on a course of oral doxycycline twice daily for 14 days with close outpatient follow-up with Dr. Lilian Kapur in 1 week.  Patient was discharged home in improved and stable condition.

## 2022-06-02 NOTE — Progress Notes (Signed)
ABI exam has been completed.   Results can be found under chart review under CV PROC. 06/02/2022 10:22 AM Deland Slocumb RVT, RDMS

## 2022-06-02 NOTE — Anesthesia Procedure Notes (Signed)
Procedure Name: MAC Date/Time: 06/02/2022 2:06 PM  Performed by: Cynda Familia, CRNAPre-anesthesia Checklist: Patient identified, Emergency Drugs available, Suction available, Patient being monitored and Timeout performed Patient Re-evaluated:Patient Re-evaluated prior to induction Oxygen Delivery Method: Simple face mask Placement Confirmation: positive ETCO2 and breath sounds checked- equal and bilateral Dental Injury: Teeth and Oropharynx as per pre-operative assessment

## 2022-06-02 NOTE — Op Note (Signed)
Patient Name: Robert [name] DOB: October 14, 1955  MRN: [id number]   Date of Service: 06/01/2022 - 06/02/2022  Surgeon: Dr. Sharl Ma, DPM Assistants: None Pre-operative Diagnosis:  Osteomyelitis left fourth toe Post-operative Diagnosis:  Osteomyelitis left fourth toe Procedures:  1) amputation partial left fourth toe Pathology/Specimens: ID Type Source Tests Collected by Time Destination  1 : left 4th toe Tissue PATH Traumatic digit amputation SURGICAL PATHOLOGY Edwin Cap, DPM 06/02/2022 1431   A : left 4th toe Tissue PATH Traumatic digit amputation AEROBIC/ANAEROBIC CULTURE W GRAM STAIN (SURGICAL/DEEP WOUND) Edwin Cap, DPM 06/02/2022 1435    Anesthesia: Local with light sedation Hemostasis: * No tourniquets in log * Estimated Blood Loss: 10 cc Materials: * No implants in log * Medications: 10 cc 0.5% Marcaine plain Complications: No complication noted  Indications for Procedure:  This is a 66 y.o. male with a history of severe diabetic peripheral neuropathy and new ulceration on the fourth toe.  Preop MRI showed osteomyelitis of the fourth toe distal phalanx.  Partial amputation was recommended   Procedure in Detail: Patient was identified in pre-operative holding area. Formal consent was signed and the left lower extremity was marked. Patient was brought back to the operating room. Anesthesia was induced. The extremity was prepped and draped in the usual sterile fashion. Timeout was taken to confirm patient name, laterality, and procedure prior to incision.   Attention was then directed to the left fourth toe where an incision was made and a fishmouth style incision. Dissection was carried down to level of bone.  Dissection was continued to the proximal interphalangeal joint and all collateral ligaments were freed at the joint.  The bone soft tissue attachments of the middle and distal phalanx were removed and passed for pathology.  A tissue culture was taken of this  necrotic specimen.  The remaining proximal phalanx head appeared healthy and viable.  The area was copiously irrigated.  The skin was reapproximated with Monocryl and nylon.  The foot was then dressed with Xeroform dry sterile dressings. Patient tolerated the procedure well.   Disposition: Following a period of post-operative monitoring, patient will be transferred to the floor.

## 2022-06-02 NOTE — Treatment Plan (Addendum)
Preliminary MRI results were reviewed and discussed with patient over the phone, recommend partial fourth toe amputation.  Surgery will be scheduled for today.  N.p.o. until then.  ABIs look to be scheduled for this morning.  Full consult note to follow  Sharl Ma, [facility] 06/02/2022

## 2022-06-02 NOTE — Anesthesia Postprocedure Evaluation (Signed)
Anesthesia Post Note  Patient: Robert [name]  Procedure(s) Performed: LEFT FOURTH PARTIAL TOE AMPUTATION (Left: Toe)     Patient location during evaluation: PACU Anesthesia Type: MAC Level of consciousness: awake and alert Pain management: pain level controlled Vital Signs Assessment: post-procedure vital signs reviewed and stable Respiratory status: spontaneous breathing, nonlabored ventilation and respiratory function stable Cardiovascular status: blood pressure returned to baseline Postop Assessment: no apparent nausea or vomiting Anesthetic complications: no   No notable events documented.  Last Vitals:  Vitals:   06/02/22 1445 06/02/22 1500  BP: 111/61 108/65  Pulse: (!) 59 (!) 58  Resp: 13 11  Temp:  36.5 C  SpO2: 93% 98%    Last Pain:  Vitals:   06/02/22 1500  TempSrc:   PainSc: 0-No pain                 Marthenia Rolling

## 2022-06-02 NOTE — Anesthesia Preprocedure Evaluation (Addendum)
Anesthesia Evaluation  Patient identified by MRN, date of birth, ID band Patient awake    Reviewed: Allergy & Precautions, NPO status , Patient's Chart, lab work & pertinent test results  History of Anesthesia Complications Negative for: history of anesthetic complications  Airway Mallampati: II  TM Distance: >3 FB Neck ROM: Full    Dental  (+) Missing,    Pulmonary asthma , sleep apnea and Continuous Positive Airway Pressure Ventilation , former smoker   Pulmonary exam normal        Cardiovascular hypertension, Pt. on medications Normal cardiovascular exam     Neuro/Psych    Depression       GI/Hepatic negative GI ROS, Neg liver ROS,,,  Endo/Other  diabetes (on Ozempic, last dose 06/01/22), Type 2    Renal/GU negative Renal ROS  negative genitourinary   Musculoskeletal  (+) Arthritis ,    Abdominal   Peds  Hematology negative hematology ROS (+)   Anesthesia Other Findings Day of surgery medications reviewed with patient.  Reproductive/Obstetrics negative OB ROS                             Anesthesia Physical Anesthesia Plan  ASA: 3 and emergent  Anesthesia Plan: MAC   Post-op Pain Management: Tylenol PO (pre-op)*   Induction:   PONV Risk Score and Plan: 1 and Treatment may vary due to age or medical condition and Midazolam  Airway Management Planned: Natural Airway and Simple Face Mask  Additional Equipment: None  Intra-op Plan:   Post-operative Plan:   Informed Consent: I have reviewed the patients History and Physical, chart, labs and discussed the procedure including the risks, benefits and alternatives for the proposed anesthesia with the patient or authorized representative who has indicated his/her understanding and acceptance.       Plan Discussed with: CRNA  Anesthesia Plan Comments: (Plan local by surgeon with minimal sedation. Daiva Huge, MD)        Anesthesia Quick Evaluation

## 2022-06-02 NOTE — Transfer of Care (Signed)
Immediate Anesthesia Transfer of Care Note  Patient: Robert [name]  Procedure(s) Performed: LEFT FOURTH PARTIAL TOE AMPUTATION (Left: Toe)  Patient Location: PACU  Anesthesia Type:MAC  Level of Consciousness: awake  Airway & Oxygen Therapy: Patient Spontanous Breathing  Post-op Assessment: Report given to RN and Post -op Vital signs reviewed and stable  Post vital signs: Reviewed and stable  Last Vitals:  Vitals Value Taken Time  BP 107/63 06/02/22 1444  Temp 36.7 C 06/02/22 1444  Pulse 58 06/02/22 1445  Resp 13 06/02/22 1445  SpO2 94 % 06/02/22 1445  Vitals shown include unvalidated device data.  Last Pain:  Vitals:   06/02/22 1444  TempSrc:   PainSc: 0-No pain      Patients Stated Pain Goal: 2 (75/64/33 2951)  Complications: No notable events documented.

## 2022-06-02 NOTE — Consult Note (Signed)
Reason for Consult: Osteomyelitis left fourth toe Referring Physician: Dr. Ortiz  Robert [name] is an 66 y.o. male.  HPI: Patient is known to our practice from previous diabetic foot infections and partial toe amputations.  He developed a worsening ulceration of the last few weeks.  He saw my partner Dr. Amalia Hailey in the office yesterday, was sent to emergency room and admitted, MRI showed osteomyelitis of the left fourth toe distal phalanx.  Past Medical History:  Diagnosis Date   Allergic rhinitis due to pollen    Arthritis    Asthma    Back pain    Barrett esophagus    Depressive disorder    Diabetes mellitus type 2 in obese (HCC)    Exposure to hepatitis B    Exposure to hepatitis C    Narcotic abuse (HCC)    pain medications   OSA (obstructive sleep apnea)    on CPAP     Past Surgical History:  Procedure Laterality Date   ABDOMINAL SURGERY     AMPUTATION Right 06/11/2018   Procedure: RIGHT 2ND RAY AMPUTATION;  Surgeon: Newt Minion, MD;  Location: [facility];  Service: Orthopedics;  Laterality: Right;   BACK SURGERY     Rods and screws in lumbar area   COLONOSCOPY     ESOPHAGOGASTRODUODENOSCOPY  multiple   HERNIA REPAIR     4   JOINT REPLACEMENT     TOTAL KNEE ARTHROPLASTY  2009   left    Family History  Problem Relation Age of Onset   Allergies Mother    Early death Father    Thyroid disease Sister    Colon cancer Neg Hx    Stomach cancer Neg Hx     Social History:  reports that he quit smoking about 17 years ago. His smoking use included cigarettes. He has a 12.00 pack-year smoking history. He has never used smokeless tobacco. He reports that he does not drink alcohol and does not use drugs.  Allergies: No Known Allergies  Medications: I have reviewed the patient's current medications.  Results for orders placed or performed during the hospital encounter of 06/01/22 (from the past 48 hour(s))  CBC     Status: None   Collection Time: 06/01/22  1:04 PM   Result Value Ref Range   WBC 4.3 4.0 - 10.5 K/uL   RBC 4.54 4.22 - 5.81 MIL/uL   Hemoglobin 13.8 13.0 - 17.0 g/dL   HCT 41.2 39.0 - 52.0 %   MCV 90.7 80.0 - 100.0 fL   MCH 30.4 26.0 - 34.0 pg   MCHC 33.5 30.0 - 36.0 g/dL   RDW 13.2 11.5 - 15.5 %   Platelets 181 150 - 400 K/uL   nRBC 0.0 0.0 - 0.2 %    Comment: Performed at [facility], [place] [address], [place], [place] [address]  Comprehensive metabolic panel     Status: Abnormal   Collection Time: 06/01/22  2:20 PM  Result Value Ref Range   Sodium 137 135 - 145 mmol/L   Potassium 5.1 3.5 - 5.1 mmol/L    Comment: HEMOLYSIS AT THIS LEVEL MAY AFFECT RESULT   Chloride 105 98 - 111 mmol/L   CO2 28 22 - 32 mmol/L   Glucose, Bld 100 (H) 70 - 99 mg/dL    Comment: Glucose reference range applies only to samples taken after fasting for at least 8 hours.   BUN 21 8 - 23 mg/dL   Creatinine, Ser  0.75 0.61 - 1.24 mg/dL   Calcium 9.4 8.9 - 10.3 mg/dL   Total Protein 7.3 6.5 - 8.1 g/dL   Albumin 4.1 3.5 - 5.0 g/dL   AST 35 15 - 41 U/L    Comment: HEMOLYSIS AT THIS LEVEL MAY AFFECT RESULT   ALT 22 0 - 44 U/L    Comment: HEMOLYSIS AT THIS LEVEL MAY AFFECT RESULT   Alkaline Phosphatase 67 38 - 126 U/L   Total Bilirubin 1.0 0.3 - 1.2 mg/dL    Comment: HEMOLYSIS AT THIS LEVEL MAY AFFECT RESULT   GFR, Estimated >60 >60 mL/min    Comment: (NOTE) Calculated using the CKD-EPI Creatinine Equation (2021)    Anion gap 4 (L) 5 - 15    Comment: Performed at [facility], [place] [address] [place], [place] [address]  HIV Antibody (routine testing w rflx)     Status: None   Collection Time: 06/01/22  4:57 PM  Result Value Ref Range   HIV Screen 4th Generation wRfx Non Reactive Non Reactive    Comment: Performed at [facility], [place] [address] [place], [place] [address]  Hemoglobin A1c     Status: None   Collection Time: 06/01/22  4:57 PM  Result Value Ref Range   Hgb A1c MFr Bld 5.1 4.8 - 5.6 %     Comment: (NOTE) Pre diabetes:          5.7%-6.4%  Diabetes:              >6.4%  Glycemic control for   <7.0% adults with diabetes    Mean Plasma Glucose 99.67 mg/dL    Comment: Performed at [place] [facility] [address], [place], [place] [address]  Sedimentation rate     Status: None   Collection Time: 06/01/22  4:57 PM  Result Value Ref Range   Sed Rate 12 0 - 16 mm/hr    Comment: Performed at [facility], [place] [address] [place], [place] [address]  C-reactive protein     Status: None   Collection Time: 06/01/22  4:57 PM  Result Value Ref Range   CRP <0.5 <1.0 mg/dL    Comment: Performed at [facility], [place] [address] [place],  [address]  Prealbumin     Status: None   Collection Time: 06/01/22  4:57 PM  Result Value Ref Range   Prealbumin 21 18 - 38 mg/dL    Comment: Performed at [place] [facility] [address], [place], [place] [address]  Glucose, capillary     Status: None   Collection Time: 06/01/22  9:42 PM  Result Value Ref Range   Glucose-Capillary 95 70 - 99 mg/dL    Comment: Glucose reference range applies only to samples taken after fasting for at least 8 hours.  Glucose, capillary     Status: None   Collection Time: 06/02/22  7:54 AM  Result Value Ref Range   Glucose-Capillary 91 70 - 99 mg/dL    Comment: Glucose reference range applies only to samples taken after fasting for at least 8 hours.  Comprehensive metabolic panel     Status: Abnormal   Collection Time: 06/02/22  9:23 AM  Result Value Ref Range   Sodium 140 135 - 145 mmol/L   Potassium 4.7 3.5 - 5.1 mmol/L   Chloride 109 98 - 111 mmol/L   CO2 27 22 - 32 mmol/L   Glucose, Bld 103 (H) 70 - 99  mg/dL    Comment: Glucose reference range applies only to samples taken after fasting for at least 8 hours.   BUN 20 8 - 23 mg/dL   Creatinine, Ser 6.01 0.61 - 1.24 mg/dL   Calcium 9.1 8.9 - 09.3 mg/dL   Total Protein 6.9 6.5 - 8.1 g/dL   Albumin 3.8 3.5 - 5.0 g/dL    AST 21 15 - 41 U/L   ALT 21 0 - 44 U/L   Alkaline Phosphatase 66 38 - 126 U/L   Total Bilirubin 0.7 0.3 - 1.2 mg/dL   GFR, Estimated >23 >55 mL/min    Comment: (NOTE) Calculated using the CKD-EPI Creatinine Equation (2021)    Anion gap 4 (L) 5 - 15    Comment: Performed at [facility], [address] [place], [place] [address]  CBC with Differential/Platelet     Status: Abnormal   Collection Time: 06/02/22  9:23 AM  Result Value Ref Range   WBC 3.8 (L) 4.0 - 10.5 K/uL   RBC 4.29 4.22 - 5.81 MIL/uL   Hemoglobin 13.3 13.0 - 17.0 g/dL   HCT 25.4 27.0 - 62.3 %   MCV 92.5 80.0 - 100.0 fL   MCH 31.0 26.0 - 34.0 pg   MCHC 33.5 30.0 - 36.0 g/dL   RDW 76.2 83.1 - 51.7 %   Platelets 157 150 - 400 K/uL   nRBC 0.0 0.0 - 0.2 %   Neutrophils Relative % 62 %   Neutro Abs 2.4 1.7 - 7.7 K/uL   Lymphocytes Relative 20 %   Lymphs Abs 0.8 0.7 - 4.0 K/uL   Monocytes Relative 12 %   Monocytes Absolute 0.5 0.1 - 1.0 K/uL   Eosinophils Relative 5 %   Eosinophils Absolute 0.2 0.0 - 0.5 K/uL   Basophils Relative 1 %   Basophils Absolute 0.0 0.0 - 0.1 K/uL   Immature Granulocytes 0 %   Abs Immature Granulocytes 0.00 0.00 - 0.07 K/uL    Comment: Performed at [facility], [address] [place], [place] [address]  Magnesium     Status: None   Collection Time: 06/02/22  9:23 AM  Result Value Ref Range   Magnesium 2.2 1.7 - 2.4 mg/dL    Comment: Performed at [facility], [address], [place], [place] [address]  APTT     Status: None   Collection Time: 06/02/22  9:23 AM  Result Value Ref Range   aPTT 32 24 - 36 seconds    Comment: Performed at [facility], [address] [place], [place] [address]  Protime-INR     Status: None   Collection Time: 06/02/22  9:23 AM  Result Value Ref Range   Prothrombin Time 14.1 11.4 - 15.2 seconds   INR 1.1 0.8 - 1.2    Comment: (NOTE) INR goal varies based on device and disease  states. Performed at [facility], [address] [place], [place] [address]   Glucose, capillary     Status: Abnormal   Collection Time: 06/02/22 11:59 AM  Result Value Ref Range   Glucose-Capillary 101 (H) 70 - 99 mg/dL    Comment: Glucose reference range applies only to samples taken after fasting for at least 8 hours.    VAS [place] ABI WITH/WO TBI  Result Date: 06/02/2022  LOWER EXTREMITY DOPPLER STUDY Patient Name:  Robert [name]  Date of Exam:   06/02/2022 Medical Rec #: [id number]  Accession #:    [id number] Date of Birth: Mar 07, 1956      Patient Gender: M Patient Age:   31 years Exam Location:  [facility] Procedure:      VAS [place] ABI WITH/WO TBI Referring Phys: DAVID ORTIZ --------------------------------------------------------------------------------  Indications: DM foot infection (LLE) High Risk         Hypertension, hyperlipidemia, Diabetes, past history of Factors:          smoking.  Comparison Study: No previous exams Performing Technologist: Hill, Jody RVT, RDMS  Examination Guidelines: A complete evaluation includes at minimum, Doppler waveform signals and systolic blood pressure reading at the level of bilateral brachial, anterior tibial, and posterior tibial arteries, when vessel segments are accessible. Bilateral testing is considered an integral part of a complete examination. Photoelectric Plethysmograph (PPG) waveforms and toe systolic pressure readings are included as required and additional duplex testing as needed. Limited examinations for reoccurring indications may be performed as noted.  ABI Findings: +---------+------------------+-----+---------+--------+ Right    Rt Pressure (mmHg)IndexWaveform Comment  +---------+------------------+-----+---------+--------+ Brachial 111                    triphasic         +---------+------------------+-----+---------+--------+ PTA      140               1.26 triphasic          +---------+------------------+-----+---------+--------+ DP       127               1.14 triphasic         +---------+------------------+-----+---------+--------+ Great Toe136               1.23 Normal            +---------+------------------+-----+---------+--------+ +---------+------------------+-----+---------+-------+ Left     Lt Pressure (mmHg)IndexWaveform Comment +---------+------------------+-----+---------+-------+ Brachial 111                    triphasic        +---------+------------------+-----+---------+-------+ PTA      129               1.16 triphasic        +---------+------------------+-----+---------+-------+ DP       145               1.31 triphasic        +---------+------------------+-----+---------+-------+ [place]               1.05 Normal           +---------+------------------+-----+---------+-------+ +-------+-----------+-----------+------------+------------+ ABI/TBIToday's ABIToday's TBIPrevious ABIPrevious TBI +-------+-----------+-----------+------------+------------+ Right  1.26       1.23                                +-------+-----------+-----------+------------+------------+ Left   1.31       1.05                                +-------+-----------+-----------+------------+------------+ Left ABI shows possible vessel calcification, however likely elevated due to infection. TBI within normal range.  Summary: Right: Resting right ankle-brachial index is within normal range. The right toe-brachial index is normal. Left: Resting left ankle-brachial index is within normal range. The left toe-brachial index is normal. *See table(s) above for measurements and observations.  Electronically signed by Deitra Mayo MD on 06/02/2022 at 12:46:18 PM.  Final    MR FOOT LEFT WO CONTRAST  Result Date: 06/02/2022 CLINICAL DATA:  Fourth toe wound EXAM: MRI OF THE LEFT FOOT WITHOUT CONTRAST TECHNIQUE: Multiplanar,  multisequence MR imaging of the left forefoot was performed. No intravenous contrast was administered. COMPARISON:  X-ray 06/01/2022, MRI 12/04/2021 FINDINGS: Bones/Joint/Cartilage Bone marrow edema with confluent low T1 marrow signal changes within the distal phalanx of the fourth toe, compatible with acute osteomyelitis. Mild bone marrow edema within the middle phalanx of the fourth toe without associated T1 signal change is suspicious for early acute osteomyelitis. Amputation changes involving the second and third toes at the PIP joint level without findings to suggest osteomyelitis at these toes. Hallux valgus deformity with advanced degenerative changes of the first MTP joint. Flexion deformity of the great toe IP joint. No acute fracture. No dislocation. Moderate-advanced arthropathy at the second tarsometatarsal joint. Ligaments Intact Lisfranc ligament.  No acute collateral ligament injury. Muscles and Tendons Amputation changes of the second and third toe flexor tendons. Denervation changes of the foot musculature. No tenosynovitis. Soft tissues Small plantar ulceration at the distal fourth toe with associated cellulitis. Generalized soft tissue edema. No organized fluid collection. IMPRESSION: 1. Small plantar ulceration at the distal fourth toe with associated cellulitis. Acute osteomyelitis of the distal phalanx of the fourth toe. 2. Mild bone marrow edema within the middle phalanx of the fourth toe is suspicious for early acute osteomyelitis. 3. Hallux valgus deformity with advanced degenerative changes of the first MTP joint. Electronically Signed   By: Davina Poke D.O.   On: 06/02/2022 09:18   DG Foot Complete Left  Result Date: 06/01/2022 CLINICAL DATA:  Fourth toe wound EXAM: LEFT FOOT - COMPLETE 3+ VIEW COMPARISON:  05/23/2022 FINDINGS: Prior second and third toe resections at the PIP joint level. No acute fracture or dislocation. Hallux valgus deformity. No definite radiographic evidence  of acute osteomyelitis, with attention to the fourth toe. Evaluation is slightly limited by joint flexion. There is soft tissue swelling of the fourth toe. No soft tissue gas is seen. IMPRESSION: 1. Soft tissue swelling of the fourth toe without definite radiographic evidence of acute osteomyelitis. 2. Prior second and third toe resections at the PIP joint level. Electronically Signed   By: Davina Poke D.O.   On: 06/01/2022 13:35    Review of Systems  Constitutional:  Positive for malaise/fatigue. Negative for chills and fever.  Skin:        Ulcer left fourth toe  All other systems reviewed and are negative.  Blood pressure 108/62, pulse (!) 52, temperature 97.6 F (36.4 C), temperature source Oral, resp. rate 18, height 5\' 11"  (1.803 m), weight (!) 143.8 kg, SpO2 99 %.  Vitals:   06/02/22 0111 06/02/22 0424  BP: (!) 93/51 108/62  Pulse: (!) 53 (!) 52  Resp: 18 18  Temp: (!) 97.5 F (36.4 C) 97.6 F (36.4 C)  SpO2: 98% 99%    General AA&O x3. Normal mood and affect.  Vascular Dorsalis pedis and posterior tibial pulses  present 1+ left  Capillary refill normal to all digits. Pedal hair growth diminished.  Neurologic Epicritic sensation grossly absent.  Dermatologic (Wound) Full-thickness ulceration distal fourth toe with exposed distal phalanx, cellulitis to MTPJ  Orthopedic: Motor intact BLE.    Assessment/Plan:  Left foot diabetic foot infection, osteomyelitis toe -Imaging: Studies independently reviewed.  Reviewed with patient. -ABIs reviewed, some possible small vessel disease but overall appears to have good perfusion on noninvasive exam -Antibiotics: He is  on cefepime vancomycin and Flagyl, continue this and narrow pending culture data -WB Status: WBAT -Surgical Plan: 2 OR today for amputation partial versus total fourth toe left  Criselda Peaches 06/02/2022, 1:53 PM   Best available via secure chat for questions or concerns.

## 2022-06-03 ENCOUNTER — Encounter (HOSPITAL_COMMUNITY): Payer: Self-pay | Admitting: Podiatry

## 2022-06-03 DIAGNOSIS — I10 Essential (primary) hypertension: Secondary | ICD-10-CM | POA: Diagnosis not present

## 2022-06-03 DIAGNOSIS — L089 Local infection of the skin and subcutaneous tissue, unspecified: Secondary | ICD-10-CM | POA: Diagnosis not present

## 2022-06-03 DIAGNOSIS — E118 Type 2 diabetes mellitus with unspecified complications: Secondary | ICD-10-CM | POA: Diagnosis not present

## 2022-06-03 DIAGNOSIS — M869 Osteomyelitis, unspecified: Secondary | ICD-10-CM | POA: Diagnosis not present

## 2022-06-03 DIAGNOSIS — E11628 Type 2 diabetes mellitus with other skin complications: Secondary | ICD-10-CM | POA: Diagnosis not present

## 2022-06-03 LAB — CBC WITH DIFFERENTIAL/PLATELET
Abs Immature Granulocytes: 0 10*3/uL (ref 0.00–0.07)
Basophils Absolute: 0 10*3/uL (ref 0.0–0.1)
Basophils Relative: 1 %
Eosinophils Absolute: 0.1 10*3/uL (ref 0.0–0.5)
Eosinophils Relative: 4 %
HCT: 39.5 % (ref 39.0–52.0)
Hemoglobin: 13.5 g/dL (ref 13.0–17.0)
Immature Granulocytes: 0 %
Lymphocytes Relative: 23 %
Lymphs Abs: 0.9 10*3/uL (ref 0.7–4.0)
MCH: 30.7 pg (ref 26.0–34.0)
MCHC: 34.2 g/dL (ref 30.0–36.0)
MCV: 89.8 fL (ref 80.0–100.0)
Monocytes Absolute: 0.4 10*3/uL (ref 0.1–1.0)
Monocytes Relative: 9 %
Neutro Abs: 2.5 10*3/uL (ref 1.7–7.7)
Neutrophils Relative %: 63 %
Platelets: 149 10*3/uL — ABNORMAL LOW (ref 150–400)
RBC: 4.4 MIL/uL (ref 4.22–5.81)
RDW: 13.2 % (ref 11.5–15.5)
WBC: 3.9 10*3/uL — ABNORMAL LOW (ref 4.0–10.5)
nRBC: 0 % (ref 0.0–0.2)

## 2022-06-03 LAB — GLUCOSE, CAPILLARY
Glucose-Capillary: 107 mg/dL — ABNORMAL HIGH (ref 70–99)
Glucose-Capillary: 100 mg/dL — ABNORMAL HIGH (ref 70–99)
Glucose-Capillary: 105 mg/dL — ABNORMAL HIGH (ref 70–99)
Glucose-Capillary: 133 mg/dL — ABNORMAL HIGH (ref 70–99)

## 2022-06-03 LAB — MAGNESIUM: Magnesium: 2.1 mg/dL (ref 1.7–2.4)

## 2022-06-03 LAB — COMPREHENSIVE METABOLIC PANEL
ALT: 21 U/L (ref 0–44)
AST: 20 U/L (ref 15–41)
Albumin: 3.9 g/dL (ref 3.5–5.0)
Alkaline Phosphatase: 62 U/L (ref 38–126)
Anion gap: 5 (ref 5–15)
BUN: 17 mg/dL (ref 8–23)
CO2: 24 mmol/L (ref 22–32)
Calcium: 9 mg/dL (ref 8.9–10.3)
Chloride: 109 mmol/L (ref 98–111)
Creatinine, Ser: 0.61 mg/dL (ref 0.61–1.24)
GFR, Estimated: >60 mL/min (ref >60)
Glucose, Bld: 97 mg/dL (ref 70–99)
Potassium: 4.1 mmol/L (ref 3.5–5.1)
Sodium: 138 mmol/L (ref 135–145)
Total Bilirubin: 0.7 mg/dL (ref 0.3–1.2)
Total Protein: 6.6 g/dL (ref 6.5–8.1)

## 2022-06-03 MED ORDER — ENSURE MAX PROTEIN PO LIQD
11.0000 [oz_av] | Freq: Every day | ORAL | Status: DC
  Administered 2022-06-03: 11 [oz_av] via ORAL
  Filled 2022-06-03 (×2): qty 330

## 2022-06-03 MED ORDER — ADULT MULTIVITAMIN W/MINERALS CH
1.0000 | ORAL_TABLET | Freq: Every day | ORAL | Status: DC
  Administered 2022-06-03 – 2022-06-04 (×2): 1 via ORAL
  Filled 2022-06-03 (×2): qty 1

## 2022-06-03 MED ORDER — ENSURE ENLIVE PO LIQD
237.0000 mL | ORAL | Status: DC
  Administered 2022-06-03 – 2022-06-04 (×2): 237 mL via ORAL

## 2022-06-03 MED ORDER — JUVEN PO PACK
1.0000 | PACK | Freq: Two times a day (BID) | ORAL | Status: DC
  Administered 2022-06-03 – 2022-06-04 (×2): 1 via ORAL
  Filled 2022-06-03 (×2): qty 1

## 2022-06-03 NOTE — Progress Notes (Signed)
  Subjective:  Patient ID: Robert [name], male    DOB: Apr 07, 1956,  MRN: [id number]  Seen at bedside POD#1 from left partial 4th toe amputation. Not much pain.   Negative for chest pain and shortness of breath Fever: no Night sweats: no Review of all other systems is negative Objective:   Vitals:   06/03/22 1321 06/03/22 1405  BP: (!) 93/59 124/76  Pulse: 95 95  Resp: 16 20  Temp: 98.7 F (37.1 C) 98.5 F (36.9 C)  SpO2: 95% 96%   General AA&O x3. Normal mood and affect.  Vascular Dorsalis pedis and posterior tibial pulses 2/4 bilat. Brisk capillary refill to all digits. Pedal hair present.  Neurologic Epicritic sensation grossly absent.  Dermatologic Incision well coapted. Cellulitis improved but still present  Orthopedic: MMT 5/5 in dorsiflexion, plantarflexion, inversion, and eversion. Normal joint ROM without pain or crepitus.    Assessment & Plan:  Patient was evaluated and treated and all questions answered.  POD#1 partial L fourth toe amputation -Dressing changed today. Would like to see more improvement in cellulitis prior to discharge. Culture with [facility] in pairs, no speciation yet. Hopefully more information tomorrow and improvement after further abx and can d/c -WBAT in post op shoe -F/U with me in 7-10 days, my office will arrange his F/U -We will re-evaluate tomorrow  Edwin Cap, DPM  Accessible via secure chat for questions or concerns.

## 2022-06-03 NOTE — Progress Notes (Signed)
PROGRESS NOTE   Robert [name]  CLE:[id number] DOB: October 19, 1955 DOA: 06/01/2022 PCP: [facility]   Date of Service: the patient was seen and examined on 06/03/2022  Brief Narrative:  66 year old male with past medical history of asthma, obstructive sleep apnea on CPAP, diabetes mellitus type 2, chronic pain syndrome, osteomyelitis of the right second toe status post right second toe amputation by Dr. Lajoyce Corners 05/2018, hyperlipidemia, class III obesity with BMI 44, hepatitis C who presented to [facility] emergency department from podiatry clinic due to progressively worsening wound of the left foot.  Upon evaluation in the emergency department patient was felt to be suffering from a diabetic foot infection with extensive ulceration of the left fourth toe and surrounding cellulitis.  The hospitalist group was then called to assess the patient for admission the hospital.  Patient was admitted to hospital service and placed on intravenous antibiotics with cefepime vancomycin and metronidazole.  MRI of the left foot was performed confirming osteomyelitis.  Dr. Lilian Kapur with podiatry was consulted and recommended partial fourth toe amputation.    Patient underwent successful partial amputation of the left fourth toe 11/18 without complication.   Assessment and Plan:  Problem  Diabetic Foot Infection (Hcc)  Patient status post partial amputation of the left fourth toe for osteomyelitis by Dr. Lilian Kapur 11/18. Per my discussion with Dr. Lilian Kapur today, patient still exhibiting substantial redness and induration surrounding the operative site concerning for cellulitic extension Continuing broad-spectrum intravenous antibiotics for now Following operative cultures which have yet to result We will de-escalate antibiotics to oral therapy once culture and sensitivities have been obtained As needed opiate-based analgesics for substantial associated pain Podiatry continuing to  follow, their assistance is appreciated   Osteomyelitis of Fourth Toe of Left Foot (Hcc)  Please see assessment and plan above   Essential hypertension  Patient blood pressures have been low normal with the current regimen of amlodipine and irbesartan We will discontinue irbesartan for now, continue amlodipine PRN intravenous antihypertensives for excessively elevated blood pressure   Type 2 Diabetes Mellitus With Complication, Without Long-Term Current Use of Insulin (Hcc)  Patient been placed on Accu-Cheks before every meal and nightly with sliding scale insulin Holding home regimen of hypoglycemics Hemoglobin A1C 5.1% Diabetic Diet   Hyperlipidemia  Continuing home regimen of lipid lowering therapy.   OSA on CPAP  CPAP nightly             Subjective:  Patient is complaining of increased pain since yesterday.  Pain of the left foot is moderate to severe in intensity, sharp in quality, radiating proximally and worse with movement of the affected extremity.   \ Physical Exam:  Vitals:   06/03/22 0900 06/03/22 1000 06/03/22 1321 06/03/22 1405  BP:  104/74 (!) 93/59 124/76  Pulse:  71 95 95  Resp:  18 16 20   Temp: 98.8 F (37.1 C) 98.3 F (36.8 C) 98.7 F (37.1 C) 98.5 F (36.9 C)  TempSrc: Oral Oral Oral Axillary  SpO2:  100% 95% 96%  Weight:      Height:        Constitutional: Awake alert and oriented x3, patient is in mild distress due to pain Skin: Dressing of left foot continues to be clean dry and intact. Eyes: Pupils are equally reactive to light.  No evidence of scleral icterus or conjunctival pallor.  ENMT: Moist mucous membranes noted.  Posterior pharynx clear of any exudate or lesions.   Respiratory: clear to auscultation bilaterally,  no wheezing, no crackles. Normal respiratory effort. No accessory muscle use.  Cardiovascular: Regular rate and rhythm, no murmurs / rubs / gallops. No extremity edema. 2+ pedal pulses. No carotid bruits.  Abdomen:  Abdomen is soft and nontender.  No evidence of intra-abdominal masses.  Positive bowel sounds noted in all quadrants.   Musculoskeletal: Appropriate tenderness of the left foot.  Comments on dressing as noted above.   Data Reviewed:  I have personally reviewed and interpreted labs, imaging.  Significant findings are   CBC: Recent Labs  Lab 06/01/22 1304 06/02/22 0923 06/03/22 0519  WBC 4.3 3.8* 3.9*  NEUTROABS  --  2.4 2.5  HGB 13.8 13.3 13.5  HCT 41.2 39.7 39.5  MCV 90.7 92.5 89.8  PLT 181 157 149*   Basic Metabolic Panel: Recent Labs  Lab 06/01/22 1420 06/02/22 0923 06/03/22 0519  NA 137 140 138  K 5.1 4.7 4.1  CL 105 109 109  CO2 28 27 24   GLUCOSE 100* 103* 97  BUN 21 20 17   CREATININE 0.75 0.73 0.61  CALCIUM 9.4 9.1 9.0  MG  --  2.2 2.1   GFR: Estimated Creatinine Clearance: 128.4 mL/min (by C-G formula based on SCr of 0.61 mg/dL). Liver Function Tests: Recent Labs  Lab 06/01/22 1420 06/02/22 0923 06/03/22 0519  AST 35 21 20  ALT 22 21 21   ALKPHOS 67 66 62  BILITOT 1.0 0.7 0.7  PROT 7.3 6.9 6.6  ALBUMIN 4.1 3.8 3.9     Code Status:  Full code.  Code status decision has been confirmed with: patient    Severity of Illness:  The appropriate patient status for this patient is INPATIENT. Inpatient status is judged to be reasonable and necessary in order to provide the required intensity of service to ensure the patient's safety. The patient's presenting symptoms, physical exam findings, and initial radiographic and laboratory data in the context of their chronic comorbidities is felt to place them at high risk for further clinical deterioration. Furthermore, it is not anticipated that the patient will be medically stable for discharge from the hospital within 2 midnights of admission.   * I certify that at the point of admission it is my clinical judgment that the patient will require inpatient hospital care spanning beyond 2 midnights from the point of  admission due to high intensity of service, high risk for further deterioration and high frequency of surveillance required.*  Time spent:  35 minutes  Author:  06/04/22 MD  06/03/2022 4:44 PM

## 2022-06-03 NOTE — Progress Notes (Signed)
Initial Nutrition Assessment RD working remotely.  DOCUMENTATION CODES:   Morbid obesity  INTERVENTION:  - ordered Ensure Max once/day, each supplement provides 150 kcal and 30 grams of protein.  - ordered Ensure Plus High Protein once/day, each supplement provides 350 kcal and 20 grams of protein.  - ordered 1packet Juven BID, each packet provides 95 calories, 2.5 grams of protein (collagen), and 9.8 grams of carbohydrate (3 grams sugar); also contains 7 grams of L-arginine and L-glutamine, 300 mg vitamin C, 15 mg vitamin E, 1.2 mcg vitamin B-12, 9.5 mg zinc, 200 mg calcium, and 1.5 g  Calcium Beta-hydroxy-Beta-methylbutyrate to support wound healing.  - ordered 1 tablet multivitamin with minerals/day.   NUTRITION DIAGNOSIS:   Increased nutrient needs related to acute illness, post-op healing, wound healing as evidenced by estimated needs.  GOAL:   Patient will meet greater than or equal to 90% of their needs  MONITOR:   PO intake, Supplement acceptance, Labs, Weight trends, Skin  REASON FOR ASSESSMENT:   Consult Wound healing  ASSESSMENT:   66 y.o. male with medical history of allergic rhinitis, arthritis, asthma, chronic back pain, Barrett's esophagus, GERD, depression, type 2 DM, obesity, hx of prescription opioid abuse, OSA on CPAP, and depression. He presented to the ED due to progressively worse LLE wound. He saw Dr. Logan Bores, D.P.M. a week ago and was prescribed doxycycline. He went today for follow-up and Dr. Logan Bores referred him to the ED.  Patient is POD #1 amputation of partial L 4th toe d/t osteomyelitis.   Diet changed from Heart Healthy/Carb Modified to NPO yesterday at midnight and then advanced to Carb Modified yesterday at 1615. No meal completion percentages documented in the flow sheet.   Weight on 11/17 was 317 lb and PTA the most recently documented weight was 340 lb on 04/03/21. This indicates 23 lb weight loss (6.8% body weight) in 14 months. No information  documented in the edema section of flow sheet.  Note within MST report shares that patient reported intentionally losing 42 lb; time frame not provided.   Labs reviewed; CBGs: 107 and 100 mg/dl. Medications reviewed; 10 mg farxiga/day, sliding scale novolog, 17 g miralax/day.    NUTRITION - FOCUSED PHYSICAL EXAM:  RD working remotely.  Diet Order:   Diet Order             Diet Carb Modified Fluid consistency: Thin; Room service appropriate? Yes  Diet effective now                   EDUCATION NEEDS:      Skin:  Skin Assessment: Skin Integrity Issues: Skin Integrity Issues:: Incisions Incisions: L foot (11/18)  Last BM:  PTA/unknown  Height:   Ht Readings from Last 1 Encounters:  06/01/22 5\' 11"  (1.803 m)    Weight:   Wt Readings from Last 1 Encounters:  06/01/22 (!) 143.8 kg    BMI:  Body mass index is 44.21 kg/m.  Estimated Nutritional Needs:  Kcal:  2200-2400 kcal Protein:  110-120 grams Fluid:  >/= 2.5 L/day     06/03/22, MS, RD, LDN, CNSC Clinical Dietitian PRN/Relief staff On-call/weekend pager # available in [facility]

## 2022-06-03 NOTE — TOC Progression Note (Signed)
Transition of Care [facility]) - Progression Note    Patient Details  Name: [name] MRN: [id number] Date of Birth: Nov 04, 1955  Transition of Care [facility] CM/SW Contact  Adrian Prows, RN Phone Number: 06/03/2022, 4:39 PM  Clinical Narrative:     Transition of Care (TOC) Screening Note   Patient Details  Name: [name] Date of Birth: [birth year]   Transition of Care [facility]) CM/SW Contact:    Adrian Prows, RN Phone Number: 06/03/2022, 4:40 PM    Transition of Care Department [facility]) has reviewed patient and no TOC needs have been identified at this time. We will continue to monitor patient advancement through interdisciplinary progression rounds. If new patient transition needs arise, please place a TOC consult.          Expected Discharge Plan and Services                                                 Social Determinants of Health (SDOH) Interventions    Readmission Risk Interventions     No data to display

## 2022-06-04 ENCOUNTER — Other Ambulatory Visit (HOSPITAL_COMMUNITY): Payer: Self-pay

## 2022-06-04 DIAGNOSIS — M869 Osteomyelitis, unspecified: Secondary | ICD-10-CM | POA: Diagnosis not present

## 2022-06-04 LAB — CBC WITH DIFFERENTIAL/PLATELET
Abs Immature Granulocytes: 0.01 10*3/uL (ref 0.00–0.07)
Basophils Absolute: 0 10*3/uL (ref 0.0–0.1)
Basophils Relative: 1 %
Eosinophils Absolute: 0.2 10*3/uL (ref 0.0–0.5)
Eosinophils Relative: 4 %
HCT: 39.7 % (ref 39.0–52.0)
Hemoglobin: 13.6 g/dL (ref 13.0–17.0)
Immature Granulocytes: 0 %
Lymphocytes Relative: 27 %
Lymphs Abs: 1 10*3/uL (ref 0.7–4.0)
MCH: 30.6 pg (ref 26.0–34.0)
MCHC: 34.3 g/dL (ref 30.0–36.0)
MCV: 89.4 fL (ref 80.0–100.0)
Monocytes Absolute: 0.5 10*3/uL (ref 0.1–1.0)
Monocytes Relative: 14 %
Neutro Abs: 2 10*3/uL (ref 1.7–7.7)
Neutrophils Relative %: 54 %
Platelets: 145 10*3/uL — ABNORMAL LOW (ref 150–400)
RBC: 4.44 MIL/uL (ref 4.22–5.81)
RDW: 13.2 % (ref 11.5–15.5)
WBC: 3.8 10*3/uL — ABNORMAL LOW (ref 4.0–10.5)
nRBC: 0 % (ref 0.0–0.2)

## 2022-06-04 LAB — COMPREHENSIVE METABOLIC PANEL
ALT: 21 U/L (ref 0–44)
AST: 19 U/L (ref 15–41)
Albumin: 4 g/dL (ref 3.5–5.0)
Alkaline Phosphatase: 61 U/L (ref 38–126)
Anion gap: 7 (ref 5–15)
BUN: 21 mg/dL (ref 8–23)
CO2: 24 mmol/L (ref 22–32)
Calcium: 9 mg/dL (ref 8.9–10.3)
Chloride: 105 mmol/L (ref 98–111)
Creatinine, Ser: 0.83 mg/dL (ref 0.61–1.24)
GFR, Estimated: >60 mL/min (ref >60)
Glucose, Bld: 103 mg/dL — ABNORMAL HIGH (ref 70–99)
Potassium: 4.1 mmol/L (ref 3.5–5.1)
Sodium: 136 mmol/L (ref 135–145)
Total Bilirubin: 0.9 mg/dL (ref 0.3–1.2)
Total Protein: 6.6 g/dL (ref 6.5–8.1)

## 2022-06-04 LAB — MAGNESIUM: Magnesium: 2.2 mg/dL (ref 1.7–2.4)

## 2022-06-04 LAB — GLUCOSE, CAPILLARY
Glucose-Capillary: 108 mg/dL — ABNORMAL HIGH (ref 70–99)
Glucose-Capillary: 125 mg/dL — ABNORMAL HIGH (ref 70–99)
Glucose-Capillary: 107 mg/dL — ABNORMAL HIGH (ref 70–99)

## 2022-06-04 MED ORDER — DOXYCYCLINE MONOHYDRATE 100 MG PO TABS
100.0000 mg | ORAL_TABLET | Freq: Two times a day (BID) | ORAL | 0 refills | Status: AC
  Filled 2022-06-04: qty 28, 14d supply, fill #0

## 2022-06-04 MED ORDER — POLYETHYLENE GLYCOL 3350 17 G PO PACK: 17.0000 g | PACK | Freq: Every day | ORAL | 0 refills | Status: DC | PRN

## 2022-06-04 MED ORDER — OXYCODONE-ACETAMINOPHEN 5-325 MG PO TABS
1.0000 | ORAL_TABLET | Freq: Four times a day (QID) | ORAL | 0 refills | Status: AC | PRN
  Filled 2022-06-04: qty 20, 3d supply, fill #0

## 2022-06-04 NOTE — TOC Progression Note (Signed)
Transition of Care [facility]) - Progression Note    Patient Details  Name: Robert [name] MRN: [id number] Date of Birth: May 24, 1956  Transition of Care [facility]) CM/SW Contact  Beckie Busing, RN Phone Number:[phone]  06/04/2022, 8:04 AM  Clinical Narrative:   TOC acknowledges general consult for Home health /DME needs/ medication assist. TOC will follow for any therapy recommendations or disposition needs.          Expected Discharge Plan and Services                                                 Social Determinants of Health (SDOH) Interventions    Readmission Risk Interventions     No data to display

## 2022-06-04 NOTE — Progress Notes (Signed)
Pharmacy Antibiotic Note  Robert [name] is a 66 y.o. male with DM who presented to the ED on 06/01/2022 with c/o redness and welling of wound in the distal tip of the left fourth toe.  He was prescribed doxycycline PTA by his podiatrist Dr. Clayburn Pert for this DM wound but it has not improved.  Patient is now s/p L 4th toe amputation on 11/18.  Pharmacy has been consulted to dose vancomycin and cefepime for infection.  Today, 06/04/2022: - scr 0.83 (crcl~100) - wbc 3.8 - afebrile - culture pending  Plan: Continue: - cefepime 2gm IV q8h - vancomycin 2000 mg IV x1, then 1250 mg IV q12h for est AUC 421 - flagyl 500 mg q12h per MD  __________________________________________  Height: 5\' 11"  (180.3 cm) Weight: (!) 143.8 kg (317 lb) IBW/kg (Calculated) : 75.3  Temp (24hrs), Avg:98.3 F (36.8 C), Min:97.2 F (36.2 C), Max:98.8 F (37.1 C)  Recent Labs  Lab 06/01/22 1304 06/01/22 1420 06/02/22 0923 06/03/22 0519  WBC 4.3  --  3.8* 3.9*  CREATININE  --  0.75 0.73 0.61     Estimated Creatinine Clearance: 128.4 mL/min (by C-G formula based on SCr of 0.61 mg/dL).    No Known Allergies  Antimicrobials this admission:  11/17 zosyn x 1 11/17 vanc>> 11/17 cefepime>> 11/17 flagyl >>  Dose adjustments this admission:   Microbiology results:  11/18 bone culture: GS: few GPC in pairs, cx pending   Thank you for allowing pharmacy to be a part of this patient's care.  12/18, PharmD, BCPS Pharmacy: [phone] 06/04/2022 8:46 AM

## 2022-06-04 NOTE — Discharge Summary (Signed)
Physician Discharge Summary   Patient: Robert [name] MRN: [id number] DOB: Apr 11, 1956  Admit date:     06/01/2022  Discharge date: 06/04/22  Discharge Physician: Vernelle Emerald   PCP: [facility]   Recommendations at discharge:   Patient has been to follow-up with Dr. Sherryle Lis with podiatry in 7 to 10 days and will be contacted with a follow-up appointment. Patient has been instructed to take the entirety of his home-going regimen of doxycycline  Patient has been prescribed a short course of Percocet for severe pain.  Patient has been asked to consume a low-sodium low carbohydrate diet Patient has been advised to leave the dressing on his left foot on until follow-up with Dr. Sherryle Lis in approximately 7 days.   Patient has been advised to return to the emergency department if developing any worsening pain, fever or generalized weakness.  Discharge Diagnoses: Active Problems:   Diabetic foot infection ([place])   Osteomyelitis of fourth toe of left foot ([place])   Essential hypertension   Type 2 diabetes mellitus with complication, without long-term current use of insulin (HCC)   Hyperlipidemia   OSA on CPAP- seen Dr Melvyn Novas in past   Chronic back pain  Resolved Problems:   * No resolved hospital problems. *   Hospital Course: 66 year old male with past medical history of asthma, obstructive sleep apnea on CPAP, diabetes mellitus type 2, chronic pain syndrome, osteomyelitis of the right second toe status post right second toe amputation by Dr. Sharol Given 05/2018, hyperlipidemia, class III obesity with BMI 44, hepatitis C who presented to [facility] emergency department from podiatry clinic due to progressively worsening wound of the left foot.  Upon evaluation in the emergency department patient was felt to be suffering from a diabetic foot infection with extensive ulceration of the left fourth toe and surrounding cellulitis.  The hospitalist group was then called  to assess the patient for admission the hospital.  Patient was admitted to hospital service and placed on intravenous antibiotics with cefepime vancomycin and metronidazole.  MRI of the left foot was performed confirming osteomyelitis.  Dr. Sherryle Lis with podiatry was consulted and recommended partial fourth toe amputation.    Patient underwent successful partial amputation of the left fourth toe 88/50 without complication.  In the days that followed, patient clinically improved with decreasing pain redness and swelling.  Preliminary Gram stain revealed gram-positive cocci in pairs.  Due to substantial clinical improvement Dr. Posey Pronto with podiatry recommended the patient may be discharged home on a course of oral doxycycline twice daily for 14 days with close outpatient follow-up with Dr. Sherryle Lis in 1 week.  Patient was discharged home in improved and stable condition.    Pain control - [employer] Controlled Substance Reporting System database was reviewed. and patient was instructed, not to drive, operate heavy machinery, perform activities at heights, swimming or participation in water activities or provide baby-sitting services while on Pain, Sleep and Anxiety Medications; until their outpatient Physician has advised to do so again. Also recommended to not to take more than prescribed Pain, Sleep and Anxiety Medications.   Consultants: Dr. Sherryle Lis with Podiatry Procedures performed: Partial fourth toe amputation of the left foot 11/18 Disposition: Home Diet recommendation:  Discharge Diet Orders (From admission, onward)     Start     Ordered   06/04/22 0000  Diet - low sodium heart healthy        06/04/22 1549   06/04/22 0000  Diet Carb Modified  06/04/22 1549           Cardiac and Carb modified diet  DISCHARGE MEDICATION: Allergies as of 06/04/2022   No Known Allergies      Medication List     STOP taking these medications    doxycycline 100 MG tablet Commonly  known as: VIBRA-TABS   gentamicin cream 0.1 % Commonly known as: GARAMYCIN   Lagevrio 200 MG Caps capsule Generic drug: molnupiravir EUA   mupirocin ointment 2 % Commonly known as: BACTROBAN   Olmesartan-amLODIPine-HCTZ 20-5-12.5 MG Tabs   traMADol 50 MG tablet Commonly known as: ULTRAM       TAKE these medications    albuterol 108 (90 Base) MCG/ACT inhaler Commonly known as: VENTOLIN HFA INHALE 1-2 PUFFS BY MOUTH EVERY 6 HOURS AS NEEDED FOR WHEEZE OR SHORTNESS OF BREATH. What changed: See the new instructions.   amLODipine-olmesartan 5-40 MG tablet Commonly known as: AZOR Take 1 tablet by mouth daily.   APPLE CIDER VINEGAR PO Take 15 mLs by mouth 2 (two) times daily.   ascorbic acid 500 MG tablet Commonly known as: VITAMIN C Take 500 mg by mouth daily.   B COMPLEX 100 PO Take 1 capsule by mouth daily.   blood glucose meter kit and supplies Dispense based on patient and insurance preference. Use to check glucose fasting in the AM and then after largest meal of the day.. (FOR ICD-10 E10.9, E11.9).   CALCIUM-MAGNESIUM-ZINC PO Take 1 tablet by mouth every other day.   cyanocobalamin 1000 MCG tablet Take 1 tablet (1,000 mcg total) by mouth daily.   doxycycline 100 MG tablet Commonly known as: ADOXA Take 1 tablet (100 mg total) by mouth 2 (two) times daily for 14 days.   Farxiga 10 MG Tabs tablet Generic drug: dapagliflozin propanediol Take 10 mg by mouth daily.   GINSENG PO Take 1 tablet by mouth once a week.   glucose blood test strip Commonly known as: ONE TOUCH ULTRA TEST Use to test blood sugar, test in the morning fasting and test after largest meal of the day.   meloxicam 15 MG tablet Commonly known as: MOBIC TAKE 1 TABLET BY MOUTH EVERY DAY   Narcan 4 MG/0.1ML Liqd nasal spray kit Generic drug: naloxone Place 1 spray into the nose daily as needed (overdose).   ondansetron 4 MG tablet Commonly known as: Zofran Take 1 tablet (4 mg total) by  mouth every 8 (eight) hours as needed for nausea or vomiting. What changed: Another medication with the same name was removed. Continue taking this medication, and follow the directions you see here.   ONE TOUCH ULTRA MINI w/Device Kit Use to test blood sugar, test in the morning fasting and test after largest meal of the day.   OneTouch Delica Lancets 73A Misc Use for testing blood sugar, test once in the morning fasting and test after largest meal of the day.   oxyCODONE-acetaminophen 5-325 MG tablet Commonly known as: Percocet Take 1-2 tablets by mouth every 6 (six) hours as needed for up to 3 days for severe pain.   Ozempic (0.25 or 0.5 MG/DOSE) 2 MG/1.5ML Sopn Generic drug: Semaglutide(0.25 or 0.5MG/DOS) INJECT 0.25MG INTO SKIN ONCE WEEKLY FOR 4 WEEKS, THEN INJECT 0.5MG INTO SKIN ONCE WEEKLY. What changed: See the new instructions.   polyethylene glycol 17 g packet Commonly known as: MIRALAX / GLYCOLAX Take 17 g by mouth daily as needed.   Potassium 99 MG Tabs Take 99 mg by mouth daily.   rosuvastatin 5  MG tablet Commonly known as: CRESTOR Take 5 mg by mouth every evening.   TURMERIC CURCUMIN PO Take 1 tablet by mouth every other day.   valACYclovir 1000 MG tablet Commonly known as: VALTREX TAKE 1 TABLET BY MOUTH DAILY AS NEEDED (FOR OUTBREAKS ONLY). What changed: See the new instructions.   Vitamin D3 125 MCG (5000 UT) Tabs 5,000 IU OTC vitamin D3 daily. What changed:  how much to take how to take this when to take this additional instructions   VITAMIN E PO Take 1 tablet by mouth daily.               Discharge Care Instructions  (From admission, onward)           Start     Ordered   06/04/22 0000  Discharge wound care:       Comments: Per Dr. Posey Pronto with podiatry patient may leave dressing on until his follow-up appointment with Dr. Sherryle Lis with podiatry clinic.  Cover foot when bathing/showering   06/04/22 1549            Follow-up  Information     [facility] Follow up in 1 week(s).   Specialty: Family Medicine Why: Call your primary care provider to establish a follow-up appointment in approximately 1 week Contact information: [place] [address] [phone]         Criselda Peaches, DPM. Go to.   Specialty: Podiatry Why: You will be contacted with a follow-up appointment with Dr. Sherryle Lis in 7 to 10 days.  If you are not called texted within the next several days he is call the office using the phone number above. Contact information: [address] [address] [phone]                 Discharge Exam: Danley Danker Weights   06/01/22 1247  Weight: (!) 143.8 kg    Constitutional: Awake alert and oriented x3, no associated distress.   Respiratory: clear to auscultation bilaterally, no wheezing, no crackles. Normal respiratory effort. No accessory muscle use.  Cardiovascular: Regular rate and rhythm, no murmurs / rubs / gallops. No extremity edema. 2+ pedal pulses. No carotid bruits.  Abdomen: Abdomen is soft and nontender.  No evidence of intra-abdominal masses.  Positive bowel sounds noted in all quadrants.   Musculoskeletal: Left foot dressing is clean dry and intact.    Condition at discharge: fair  The results of significant diagnostics from this hospitalization (including imaging, microbiology, ancillary and laboratory) are listed below for reference.   Imaging Studies: VAS [place] ABI WITH/WO TBI  Result Date: 06/02/2022  LOWER EXTREMITY DOPPLER STUDY Patient Name:  Hargis D [name]  Date of Exam:   06/02/2022 Medical Rec #: [id number]      Accession #:    [id number] Date of Birth: September 11, 1955      Patient Gender: M Patient Age:   80 years Exam Location:  [facility] Procedure:      VAS [place] ABI WITH/WO TBI Referring Phys: DAVID ORTIZ --------------------------------------------------------------------------------  Indications: DM foot  infection ([facility]) High Risk         Hypertension, hyperlipidemia, Diabetes, past history of Factors:          smoking.  Comparison Study: No previous exams Performing Technologist: Hill, Jody RVT, RDMS  Examination Guidelines: A complete evaluation includes at minimum, Doppler waveform signals and systolic blood pressure reading at the level of bilateral brachial, anterior tibial, and posterior tibial  arteries, when vessel segments are accessible. Bilateral testing is considered an integral part of a complete examination. Photoelectric Plethysmograph (PPG) waveforms and toe systolic pressure readings are included as required and additional duplex testing as needed. Limited examinations for reoccurring indications may be performed as noted.  ABI Findings: +---------+------------------+-----+---------+--------+ Right    Rt Pressure (mmHg)IndexWaveform Comment  +---------+------------------+-----+---------+--------+ Brachial 111                    triphasic         +---------+------------------+-----+---------+--------+ PTA      140               1.26 triphasic         +---------+------------------+-----+---------+--------+ DP       127               1.14 triphasic         +---------+------------------+-----+---------+--------+ Great Toe136               1.23 Normal            +---------+------------------+-----+---------+--------+ +---------+------------------+-----+---------+-------+ Left     Lt Pressure (mmHg)IndexWaveform Comment +---------+------------------+-----+---------+-------+ Brachial 111                    triphasic        +---------+------------------+-----+---------+-------+ PTA      129               1.16 triphasic        +---------+------------------+-----+---------+-------+ DP       145               1.31 triphasic        +---------+------------------+-----+---------+-------+ [place]               1.05 Normal            +---------+------------------+-----+---------+-------+ +-------+-----------+-----------+------------+------------+ ABI/TBIToday's ABIToday's TBIPrevious ABIPrevious TBI +-------+-----------+-----------+------------+------------+ Right  1.26       1.23                                +-------+-----------+-----------+------------+------------+ Left   1.31       1.05                                +-------+-----------+-----------+------------+------------+ Left ABI shows possible vessel calcification, however likely elevated due to infection. TBI within normal range.  Summary: Right: Resting right ankle-brachial index is within normal range. The right toe-brachial index is normal. Left: Resting left ankle-brachial index is within normal range. The left toe-brachial index is normal. *See table(s) above for measurements and observations.  Electronically signed by Deitra Mayo MD on 06/02/2022 at 12:46:18 PM.    Final    MR FOOT LEFT WO CONTRAST  Result Date: 06/02/2022 CLINICAL DATA:  Fourth toe wound EXAM: MRI OF THE LEFT FOOT WITHOUT CONTRAST TECHNIQUE: Multiplanar, multisequence MR imaging of the left forefoot was performed. No intravenous contrast was administered. COMPARISON:  X-ray 06/01/2022, MRI 12/04/2021 FINDINGS: Bones/Joint/Cartilage Bone marrow edema with confluent low T1 marrow signal changes within the distal phalanx of the fourth toe, compatible with acute osteomyelitis. Mild bone marrow edema within the middle phalanx of the fourth toe without associated T1 signal change is suspicious for early acute osteomyelitis. Amputation changes involving the second and third toes at the PIP joint level without findings to suggest osteomyelitis at these toes. Hallux  valgus deformity with advanced degenerative changes of the first MTP joint. Flexion deformity of the great toe IP joint. No acute fracture. No dislocation. Moderate-advanced arthropathy at the second tarsometatarsal joint.  Ligaments Intact Lisfranc ligament.  No acute collateral ligament injury. Muscles and Tendons Amputation changes of the second and third toe flexor tendons. Denervation changes of the foot musculature. No tenosynovitis. Soft tissues Small plantar ulceration at the distal fourth toe with associated cellulitis. Generalized soft tissue edema. No organized fluid collection. IMPRESSION: 1. Small plantar ulceration at the distal fourth toe with associated cellulitis. Acute osteomyelitis of the distal phalanx of the fourth toe. 2. Mild bone marrow edema within the middle phalanx of the fourth toe is suspicious for early acute osteomyelitis. 3. Hallux valgus deformity with advanced degenerative changes of the first MTP joint. Electronically Signed   By: Davina Poke D.O.   On: 06/02/2022 09:18   DG Foot Complete Left  Result Date: 06/01/2022 CLINICAL DATA:  Fourth toe wound EXAM: LEFT FOOT - COMPLETE 3+ VIEW COMPARISON:  05/23/2022 FINDINGS: Prior second and third toe resections at the PIP joint level. No acute fracture or dislocation. Hallux valgus deformity. No definite radiographic evidence of acute osteomyelitis, with attention to the fourth toe. Evaluation is slightly limited by joint flexion. There is soft tissue swelling of the fourth toe. No soft tissue gas is seen. IMPRESSION: 1. Soft tissue swelling of the fourth toe without definite radiographic evidence of acute osteomyelitis. 2. Prior second and third toe resections at the PIP joint level. Electronically Signed   By: Davina Poke D.O.   On: 06/01/2022 13:35    Microbiology: Results for orders placed or performed during the hospital encounter of 06/01/22  Aerobic/Anaerobic Culture w Gram Stain (surgical/deep wound)     Status: None (Preliminary result)   Collection Time: 06/02/22  3:57 PM   Specimen: PATH Traumatic digit amputation; Tissue  Result Value Ref Range Status   Specimen Description   Final    BONE Performed at [facility] [address], [place], [place] [address]    Special Requests   Final    NONE Performed at [facility], [place] [address], [place], [place] [address]    Gram Stain   Final    FEW GRAM POSITIVE COCCI IN PAIRS NO WBC SEEN Performed at [facility], [place] [address] [place], [place] [address]    Culture   Final    CULTURE REINCUBATED FOR BETTER GROWTH NO ANAEROBES ISOLATED; CULTURE IN PROGRESS FOR 5 DAYS    Report Status PENDING  Incomplete    Labs: CBC: Recent Labs  Lab 06/01/22 1304 06/02/22 0923 06/03/22 0519 06/04/22 0714  WBC 4.3 3.8* 3.9* 3.8*  NEUTROABS  --  2.4 2.5 2.0  HGB 13.8 13.3 13.5 13.6  HCT 41.2 39.7 39.5 39.7  MCV 90.7 92.5 89.8 89.4  PLT 181 157 149* 914*   Basic Metabolic Panel: Recent Labs  Lab 06/01/22 1420 06/02/22 0923 06/03/22 0519 06/04/22 0714  NA 137 140 138 136  K 5.1 4.7 4.1 4.1  CL 105 109 109 105  CO2 _0 GLUCOSE 100* 103* 97 103*  BUN _1 CREATININE 0.75 0.73 0.61 0.83  CALCIUM 9.4 9.1 9.0 9.0  MG  --  2.2 2.1 2.2   Liver Function Tests: Recent Labs  Lab 06/01/22 1420 06/02/22 0923 06/03/22 0519 06/04/22 0714  AST 35 _2 ALT 22 21  21 21  ALKPHOS 67 66 62 61  BILITOT 1.0 0.7 0.7 0.9  PROT 7.3 6.9 6.6 6.6  ALBUMIN 4.1 3.8 3.9 4.0   CBG: Recent Labs  Lab 06/03/22 1121 06/03/22 1650 06/03/22 2228 06/04/22 0843 06/04/22 1154  GLUCAP 100* 105* 133* 108* 125*    Discharge time spent: greater than 30 minutes.  Signed: Vernelle Emerald, MD [facility] 06/04/2022

## 2022-06-04 NOTE — Discharge Instructions (Signed)
Please follow-up with Dr. Lilian Kapur with podiatry in 7 to 10 days.  You will be contacted with a follow-up appointment.  If you do not hear from the podiatry clinic please contact them and inquire using the attached phone number. Please take all of occasions as instructed including taking the entire day of your outpatient antibiotic regimen of doxycycline You have been prescribed a short course of Percocet for severe pain.  Please use this medication sparingly. Consume a low-sodium low carbohydrate diet Leave the dressing on your left foot on until you follow-up with Dr. Lilian Kapur in approximately 7 days.  Cover the foot when showering/bathing. Please return to the emergency department if you develop any worsening pain, fever or generalized weakness.

## 2022-06-04 NOTE — Progress Notes (Signed)
  Subjective:  Patient ID: Robert [name], male    DOB: [birth year],  MRN: [id number]  Seen at bedside POD#2 from left partial 4th toe amputation. Not much pain.   Negative for chest pain and shortness of breath Fever: no Night sweats: no Review of all other systems is negative Objective:   Vitals:   06/03/22 2228 06/04/22 0536  BP: (!) 109/59 95/61  Pulse: 65 66  Resp: 17 17  Temp: 98.5 F (36.9 C) (!) 97.2 F (36.2 C)  SpO2: 95% 94%   General AA&O x3. Normal mood and affect.  Vascular Dorsalis pedis and posterior tibial pulses 2/4 bilat. Brisk capillary refill to all digits. Pedal hair present.  Neurologic Epicritic sensation grossly absent.  Dermatologic Incision well coapted.  Redness improved since yesterday.  Only minimal erythema noted.  Orthopedic: MMT 5/5 in dorsiflexion, plantarflexion, inversion, and eversion. Normal joint ROM without pain or crepitus.    Assessment & Plan:  Patient was evaluated and treated and all questions answered.  POD#2 partial L fourth toe amputation -Dressing changed today. -Wound culture still growing GPC.  Patient can be discharged on oral antibiotics doxycycline for 14 days.  Hopefully the cultures will be back later today. -WBAT in post op shoe -He will follow-up with Dr. Lilian Kapur in about 7 to 10 days. -Patient is okay to be discharged from podiatric standpoint.  Candelaria Stagers, DPM  Accessible via secure chat for questions or concerns.

## 2022-06-05 LAB — SURGICAL PATHOLOGY

## 2022-06-07 LAB — AEROBIC/ANAEROBIC CULTURE W GRAM STAIN (SURGICAL/DEEP WOUND)

## 2022-06-11 ENCOUNTER — Telehealth: Payer: Self-pay | Admitting: *Deleted

## 2022-06-11 NOTE — Telephone Encounter (Signed)
Patient is calling because his 4th toe area is still purple even after removing bandage, is this normal so far out of surgery. He is also wanting to know if it is ok to take shower now? Please advise.

## 2022-06-13 ENCOUNTER — Ambulatory Visit: Payer: Medicare Other | Admitting: Podiatry

## 2022-06-13 DIAGNOSIS — L97522 Non-pressure chronic ulcer of other part of left foot with fat layer exposed: Secondary | ICD-10-CM

## 2022-06-14 NOTE — Progress Notes (Signed)
  Subjective:  Patient ID: Robert [name], male    DOB: 26-Aug-1955,  MRN: [id number]  Chief Complaint  Patient presents with   Routine Post Op    Pov #1 DOS 06/02/22 L 4th partial toe amputation.    66 y.o. male returns for post-op check.  Overall doing well  Review of Systems: Negative except as noted in the HPI. Denies N/V/F/Ch.   Objective:  There were no vitals filed for this visit. There is no height or weight on file to calculate BMI. Constitutional Well developed. Well nourished.  Vascular Foot warm and well perfused. Capillary refill normal to all digits.  Calf is soft and supple, no posterior calf or knee pain, negative Homans' sign  Neurologic Normal speech. Oriented to person, place, and time. Epicritic sensation to light touch grossly reduced bilaterally.  Dermatologic Skin healing well without signs of infection. Skin edges well coapted without signs of infection.  Preulcerative callus hallux  Orthopedic: Tenderness to palpation noted about the surgical site.    Assessment:   1. Toe ulcer, left, with fat layer exposed (HCC)    Plan:  Patient was evaluated and treated and all questions answered.  S/p foot surgery left -Progressing as expected post-operatively. -Return in 2 weeks under scheduled for suture removal he can begin taking showers at this point and apply Neosporin after.  Light gauze dressing should be applied.  Should not need dressings after next visit when sutures are out -We discussed shoe gear and risk of recurrence of ulceration especially on the hallux which he had a small preulcerative callus here today.  He will apply Neosporin to this  No follow-ups on file.

## 2022-06-28 ENCOUNTER — Ambulatory Visit (INDEPENDENT_AMBULATORY_CARE_PROVIDER_SITE_OTHER): Payer: Medicare Other | Admitting: Podiatry

## 2022-06-28 DIAGNOSIS — L97522 Non-pressure chronic ulcer of other part of left foot with fat layer exposed: Secondary | ICD-10-CM

## 2022-06-28 NOTE — Progress Notes (Signed)
Patient presents today for post op visit # 2, patient of Dr. Lilian Kapur.   POV # 2 DOS 06/02/22 Left 4th partial toe amputation    He presents in his surgical shoe. Denies any falls or injury to the foot. Foot is swollen. No signs of infection. No calf pain or shortness of breath. Bandages dry and intact. Incision is intact. Sutures were removed without complication. Patient tolerated suture removal well.  BP: 128/68 P: 82  He was placed back in the surgical shoe. Reviewed icing and elevation. He will follow up with  Dr.McDonald in 1 month for POV# 3.

## 2022-07-31 ENCOUNTER — Ambulatory Visit: Payer: Medicare Other | Admitting: Podiatry

## 2022-07-31 DIAGNOSIS — L97522 Non-pressure chronic ulcer of other part of left foot with fat layer exposed: Secondary | ICD-10-CM

## 2022-07-31 DIAGNOSIS — E0843 Diabetes mellitus due to underlying condition with diabetic autonomic (poly)neuropathy: Secondary | ICD-10-CM | POA: Diagnosis not present

## 2022-08-01 ENCOUNTER — Encounter: Payer: Self-pay | Admitting: Podiatry

## 2022-08-01 NOTE — Progress Notes (Signed)
  Subjective:  Patient ID: Robert [name], male    DOB: [birth year],  MRN: [id number]  Routine Post Op       Pov #1 DOS 06/02/22 L 4th partial toe amputation.     67 y.o. male returns for post-op check.  Overall doing well he notes no drainage  Review of Systems: Negative except as noted in the HPI. Denies N/V/F/Ch.   Objective:  There were no vitals filed for this visit. There is no height or weight on file to calculate BMI. Constitutional Well developed. Well nourished.  Vascular Foot warm and well perfused. Capillary refill normal to all digits.  Calf is soft and supple, no posterior calf or knee pain, negative Homans' sign  Neurologic Normal speech. Oriented to person, place, and time. Epicritic sensation to light touch grossly reduced bilaterally.  Dermatologic Incision is well-healed not hypertrophic.  Preulcerative callus hallux  Orthopedic: Tenderness to palpation noted about the surgical site.    Assessment:   1. Toe ulcer, left, with fat layer exposed ([place])   2. Diabetes mellitus due to underlying condition with diabetic autonomic neuropathy, unspecified whether long term insulin use ([place])    Plan:  Patient was evaluated and treated and all questions answered.  S/p foot surgery left -Progressing as expected post-operatively. -We discussed shoe gear and risk of recurrence of ulceration especially on the hallux which he had a small preulcerative callus here today.  I debrided the callus of overlying hyperkeratosis.  He will apply urea cream for this -Will transition him to a regular at risk diabetic footcare schedule every 3 months so the calluses and nails do not become further ulcerations  Return in about 3 months (around 10/30/2022) for at risk diabetic foot care.

## 2022-10-30 ENCOUNTER — Ambulatory Visit: Payer: Medicare Other | Admitting: Podiatry

## 2022-11-14 ENCOUNTER — Telehealth: Payer: Self-pay | Admitting: Podiatry

## 2022-11-14 NOTE — Telephone Encounter (Signed)
Pt called and needed an appt as he is diabetic and stated he was filing down his calluses and the one on his big toe he accidentally missed the area an made a tear in his skin and it did bleed. It is not currently bleeding Pt scheduled to see Dr Ralene Cork 5.8 but I did offer an appt on 5.1 but pt could not come due to taking son to an interview.

## 2022-11-21 ENCOUNTER — Encounter: Payer: Self-pay | Admitting: Podiatry

## 2022-11-21 ENCOUNTER — Ambulatory Visit: Payer: Medicare Other | Admitting: Podiatry

## 2022-11-21 DIAGNOSIS — E0843 Diabetes mellitus due to underlying condition with diabetic autonomic (poly)neuropathy: Secondary | ICD-10-CM | POA: Diagnosis not present

## 2022-11-21 DIAGNOSIS — L97521 Non-pressure chronic ulcer of other part of left foot limited to breakdown of skin: Secondary | ICD-10-CM

## 2022-11-21 NOTE — Progress Notes (Signed)
  Subjective:  Patient ID: Robert [name], male    DOB: 1956-03-09,   MRN: [id number]  Chief Complaint  Patient presents with   Callouses    Patient was trying to shave callous down by himself and cut himself. No discharge , no pain. Left great toe is just red     67 y.o. male presents for concern as above. Patient with history of toe ulceration and toe amputation on left foot. Relates he was trimming a callus with nail file and nicked his big toe and relates it started bleeding and wound worsened.   . Denies any other pedal complaints. Denies n/v/f/c.   Past Medical History:  Diagnosis Date   Allergic rhinitis due to pollen    Arthritis    Asthma    Back pain    Barrett esophagus    Depressive disorder    Diabetes mellitus type 2 in obese (HCC)    Exposure to hepatitis B    Exposure to hepatitis C    Narcotic abuse (HCC)    pain medications   OSA (obstructive sleep apnea)    on CPAP     Objective:  Physical Exam: Vascular: DP/PT pulses 2/4 bilateral. CFT <3 seconds. Normal hair growth on digits. No edema.  Skin. No lacerations or abrasions bilateral feet. Ulceration noted to plantar distal left hallux about 2 cm x 1 cm x 0.1 cm with granular base and no surrounding erythema edema or purulence noted Musculoskeletal: MMT 5/5 bilateral lower extremities in DF, PF, Inversion and Eversion. Deceased ROM in DF of ankle joint. Partial second and third digit amputation and fourth digit amputation on left .  Neurological: Sensation intact to light touch.   Assessment:   1. Diabetes mellitus due to underlying condition with diabetic autonomic neuropathy, unspecified whether long term insulin use (HCC)   2. Ulcer of great toe, left, limited to breakdown of skin [facility])      Plan:  Patient was evaluated and treated and all questions answered. Ulcer left hallux limited to breakdown of skin  -Debridement as below. -Dressed with betadine, DSD. -Off-loading with surgical shoe. -No abx  indicated.  -Discussed glucose control and proper protein-rich diet.  -Discussed if any worsening redness, pain, fever or chills to call or may need to report to the emergency room. Patient expressed understanding.   Procedure: Excisional Debridement of Wound Rationale: Removal of non-viable soft tissue from the wound to promote healing.  Anesthesia: none Pre-Debridement Wound Measurements: Overlying callus  Post-Debridement Wound Measurements: 1 cm x 2 cm x 0.1 cm  Type of Debridement: Sharp Excisional Tissue Removed: Non-viable soft tissue Depth of Debridement: subcutaneous tissue. Technique: Sharp excisional debridement to bleeding, viable wound base.  Dressing: Dry, sterile, compression dressing. Disposition: Patient tolerated procedure well. Patient to return in 2 week for follow-up.  Return in about 2 weeks (around 12/05/2022) for wound check.   Louann Sjogren, DPM

## 2022-12-05 ENCOUNTER — Ambulatory Visit (INDEPENDENT_AMBULATORY_CARE_PROVIDER_SITE_OTHER): Payer: Medicare Other | Admitting: Podiatry

## 2022-12-05 DIAGNOSIS — Z91199 Patient's noncompliance with other medical treatment and regimen due to unspecified reason: Secondary | ICD-10-CM

## 2022-12-05 NOTE — Progress Notes (Signed)
No show

## 2023-03-20 ENCOUNTER — Ambulatory Visit: Payer: Medicare Other | Admitting: Podiatry

## 2023-03-20 ENCOUNTER — Encounter: Payer: Self-pay | Admitting: Podiatry

## 2023-03-20 DIAGNOSIS — E0843 Diabetes mellitus due to underlying condition with diabetic autonomic (poly)neuropathy: Secondary | ICD-10-CM | POA: Diagnosis not present

## 2023-03-20 DIAGNOSIS — L97512 Non-pressure chronic ulcer of other part of right foot with fat layer exposed: Secondary | ICD-10-CM | POA: Diagnosis not present

## 2023-03-20 DIAGNOSIS — L03031 Cellulitis of right toe: Secondary | ICD-10-CM | POA: Diagnosis not present

## 2023-03-20 NOTE — Progress Notes (Addendum)
Subjective:  Patient ID: Robert [name], male    DOB: 1956-05-02,   MRN: [id number]  No chief complaint on file.   67 y.o. male presents for for concern of ingrown toenail. And infection in his right second toe. Relates he notices this a couple days ago and decided to come in after he had tried to trim ingrown himself.  Of note he never followed up after last appointment for wound care in May. . Patient is diabetic and last A1c was  Lab Results  Component Value Date   HGBA1C 5.1 06/01/2022   .   PCP:  [facility]    . Denies any other pedal complaints. Denies n/v/f/c.   Past Medical History:  Diagnosis Date   Allergic rhinitis due to pollen    Arthritis    Asthma    Back pain    Barrett esophagus    Depressive disorder    Diabetes mellitus type 2 in obese    Exposure to hepatitis B    Exposure to hepatitis C    Narcotic abuse (HCC)    pain medications   OSA (obstructive sleep apnea)    on CPAP     Objective:  Physical Exam: Vascular: DP/PT pulses 2/4 bilateral. CFT <3 seconds. Normal hair growth on digits. No edema.  Skin. No lacerations or abrasions bilateral feet. Ulcer noted to dorsum of right second digit with granular base and surrounding edema and erythema. No probe to bone. Measures about 0.2 cm x 0.2 cmx 0.2 cm . Right second digit nail loose and upon debridement nail bed healthy and granular in appearance. Musculoskeletal: MMT 5/5 bilateral lower extremities in DF, PF, Inversion and Eversion. Deceased ROM in DF of ankle joint. Partial second and third digit amputation and fourth digit amputation on left .  Neurological: Sensation intact to light touch.   Assessment:   1. Ulcer of right second toe with fat layer exposed (HCC)   2. Diabetes mellitus due to underlying condition with diabetic autonomic neuropathy, unspecified whether long term insulin use (HCC)   3. Paronychia, toe, right       Plan:  Patient was evaluated and treated and all  questions answered. Ulcer right second digit dorsally with fat layer exposed  -Debridement as below. -Dressed with betadine , DSD. -Off-loading with surgical shoe. -Doxycycline sent.  -Discussed glucose control and proper protein-rich diet.  -Discussed if any worsening redness, pain, fever or chills to call or may need to report to the emergency room. Patient expressed understanding.   Discussed ingrown infected toenails etiology and treatment options including procedure for removal vs conservative care.  Patient requesting removal of ingrown nail today. Procedure below.  Discussed procedure and post procedure care and patient expressed understanding.    Procedure:  Procedure: total Nail Avulsion of right hallux second digit nail   Surgeon: Louann Sjogren, DPM  Pre-op Dx: Ingrown toenail with infection Post-op: Same  Place of Surgery: Office exam room.  Indications for surgery: Painful and ingrown toenail.    The patient is requesting removal of nail without chemical matrixectomy. Risks and complications were discussed with the patient for which they understand and written consent was obtained. Anesthesia unnecessary due to significant neuropathy,  the skin was prepped in sterile fashion. A tourniquet was then applied. Next the entire second digit nail was removed. The area was copiously irrigated. Silvadene was applied. A dry sterile dressing was applied. After application of the dressing the tourniquet was removed and there is  found to be an immediate capillary refill time to the digit. The patient tolerated the procedure well without any complications. Post procedure instructions were discussed the patient for which he verbally understood. Follow-up in two weeks for nail check or sooner if any problems are to arise. Discussed signs/symptoms of infection and directed to call the office immediately should any occur or go directly to the emergency room. In the meantime, encouraged to call the  office with any questions, concerns, changes symptoms.   Procedure: Excisional Debridement of Wound Rationale: Removal of non-viable soft tissue from the wound to promote healing.  Anesthesia: none Pre-Debridement Wound Measurements: Overlying slough  Post-Debridement Wound Measurements: 0.2 cm x 0.2 cm x 0.2 cm  Type of Debridement: Sharp Excisional Tissue Removed: Non-viable soft tissue Depth of Debridement: subcutaneous tissue. Technique: Sharp excisional debridement to bleeding, viable wound base.  Dressing: Dry, sterile, compression dressing. Disposition: Patient tolerated procedure well. Patient to return in 2 week for follow-up.  Return in about 2 weeks (around 04/03/2023) for wound check, nail check.   Return in about 2 weeks (around 04/03/2023) for wound check, nail check.   Louann Sjogren, DPM

## 2023-03-22 ENCOUNTER — Other Ambulatory Visit: Payer: Self-pay | Admitting: Podiatry

## 2023-03-22 ENCOUNTER — Telehealth: Payer: Self-pay | Admitting: Podiatry

## 2023-03-22 MED ORDER — DOXYCYCLINE HYCLATE 100 MG PO TABS: 100.0000 mg | ORAL_TABLET | Freq: Two times a day (BID) | ORAL | 0 refills | Status: AC

## 2023-03-22 NOTE — Telephone Encounter (Signed)
Pt called and was to have doxycycline in and the pharmacy has not received it. [facility] on [address]

## 2023-03-22 NOTE — Telephone Encounter (Signed)
Left message for pt that medication was sent in and to call if any issues.

## 2023-04-03 ENCOUNTER — Telehealth: Payer: Self-pay | Admitting: Podiatry

## 2023-04-03 ENCOUNTER — Ambulatory Visit: Payer: Medicare Other | Admitting: Podiatry

## 2023-04-03 NOTE — Telephone Encounter (Signed)
Pt had an appointment today but car is dead and can't make it he is concerned that his bone with wound will not be able to hold on with out being seen until next week. He wants to know can he have doxycycline (VIBRA-TABS) 100 MG tablet called in for him until he sees you. He would also like you to call him to discuss what might happen if he is not seen until next week. Please advise.

## 2023-04-05 ENCOUNTER — Telehealth: Payer: Self-pay | Admitting: Podiatry

## 2023-04-05 ENCOUNTER — Other Ambulatory Visit: Payer: Self-pay | Admitting: Podiatry

## 2023-04-05 MED ORDER — DOXYCYCLINE HYCLATE 100 MG PO TABS: 100.0000 mg | ORAL_TABLET | Freq: Two times a day (BID) | ORAL | 0 refills | Status: DC

## 2023-04-05 NOTE — Telephone Encounter (Signed)
Pt called and his 3rd toe has innfection that has gotten worse. Could you call him in another round of the doxycyline. He has an appt 9.25 with you. Pharmacy is correct in system.

## 2023-04-05 NOTE — Telephone Encounter (Signed)
Left message for pt that the antibiotics were called in for pt.

## 2023-04-05 NOTE — Telephone Encounter (Signed)
I will send in another round thanks

## 2023-04-10 ENCOUNTER — Ambulatory Visit: Payer: Medicare Other | Admitting: Podiatry

## 2023-04-10 ENCOUNTER — Emergency Department (HOSPITAL_COMMUNITY): Payer: Medicare Other

## 2023-04-10 ENCOUNTER — Ambulatory Visit (INDEPENDENT_AMBULATORY_CARE_PROVIDER_SITE_OTHER): Payer: Medicare Other

## 2023-04-10 ENCOUNTER — Other Ambulatory Visit: Payer: Self-pay

## 2023-04-10 ENCOUNTER — Encounter: Payer: Self-pay | Admitting: Podiatry

## 2023-04-10 ENCOUNTER — Encounter (HOSPITAL_COMMUNITY): Payer: Self-pay

## 2023-04-10 ENCOUNTER — Inpatient Hospital Stay (HOSPITAL_COMMUNITY)
Admission: EM | Admit: 2023-04-10 | Discharge: 2023-04-12 | DRG: 617 | Disposition: A | Discharge status: Home / Self Care | Payer: Medicare Other | Source: Ambulatory Visit | Attending: Infectious Diseases | Admitting: Infectious Diseases

## 2023-04-10 DIAGNOSIS — L97514 Non-pressure chronic ulcer of other part of right foot with necrosis of bone: Secondary | ICD-10-CM

## 2023-04-10 DIAGNOSIS — E0843 Diabetes mellitus due to underlying condition with diabetic autonomic (poly)neuropathy: Secondary | ICD-10-CM | POA: Diagnosis not present

## 2023-04-10 DIAGNOSIS — Z7985 Long-term (current) use of injectable non-insulin antidiabetic drugs: Secondary | ICD-10-CM

## 2023-04-10 DIAGNOSIS — E11621 Type 2 diabetes mellitus with foot ulcer: Secondary | ICD-10-CM | POA: Diagnosis present

## 2023-04-10 DIAGNOSIS — F419 Anxiety disorder, unspecified: Secondary | ICD-10-CM | POA: Diagnosis present

## 2023-04-10 DIAGNOSIS — M869 Osteomyelitis, unspecified: Secondary | ICD-10-CM | POA: Diagnosis not present

## 2023-04-10 DIAGNOSIS — Z7984 Long term (current) use of oral hypoglycemic drugs: Secondary | ICD-10-CM | POA: Diagnosis not present

## 2023-04-10 DIAGNOSIS — L089 Local infection of the skin and subcutaneous tissue, unspecified: Secondary | ICD-10-CM | POA: Diagnosis present

## 2023-04-10 DIAGNOSIS — L909 Atrophic disorder of skin, unspecified: Secondary | ICD-10-CM | POA: Diagnosis present

## 2023-04-10 DIAGNOSIS — G4733 Obstructive sleep apnea (adult) (pediatric): Secondary | ICD-10-CM | POA: Diagnosis present

## 2023-04-10 DIAGNOSIS — E785 Hyperlipidemia, unspecified: Secondary | ICD-10-CM | POA: Diagnosis present

## 2023-04-10 DIAGNOSIS — G8929 Other chronic pain: Secondary | ICD-10-CM | POA: Diagnosis present

## 2023-04-10 DIAGNOSIS — Z89422 Acquired absence of other left toe(s): Secondary | ICD-10-CM | POA: Diagnosis not present

## 2023-04-10 DIAGNOSIS — F32A Depression, unspecified: Secondary | ICD-10-CM | POA: Diagnosis present

## 2023-04-10 DIAGNOSIS — E1169 Type 2 diabetes mellitus with other specified complication: Secondary | ICD-10-CM | POA: Diagnosis present

## 2023-04-10 DIAGNOSIS — L97519 Non-pressure chronic ulcer of other part of right foot with unspecified severity: Secondary | ICD-10-CM | POA: Diagnosis present

## 2023-04-10 DIAGNOSIS — M86171 Other acute osteomyelitis, right ankle and foot: Secondary | ICD-10-CM | POA: Diagnosis present

## 2023-04-10 DIAGNOSIS — I159 Secondary hypertension, unspecified: Secondary | ICD-10-CM | POA: Diagnosis present

## 2023-04-10 DIAGNOSIS — L97509 Non-pressure chronic ulcer of other part of unspecified foot with unspecified severity: Secondary | ICD-10-CM | POA: Diagnosis not present

## 2023-04-10 DIAGNOSIS — Z79899 Other long term (current) drug therapy: Secondary | ICD-10-CM

## 2023-04-10 DIAGNOSIS — E1140 Type 2 diabetes mellitus with diabetic neuropathy, unspecified: Secondary | ICD-10-CM | POA: Diagnosis present

## 2023-04-10 DIAGNOSIS — Z791 Long term (current) use of non-steroidal anti-inflammatories (NSAID): Secondary | ICD-10-CM

## 2023-04-10 DIAGNOSIS — E11628 Type 2 diabetes mellitus with other skin complications: Secondary | ICD-10-CM | POA: Diagnosis not present

## 2023-04-10 DIAGNOSIS — E1151 Type 2 diabetes mellitus with diabetic peripheral angiopathy without gangrene: Secondary | ICD-10-CM | POA: Diagnosis present

## 2023-04-10 DIAGNOSIS — Z6841 Body Mass Index (BMI) 40.0 and over, adult: Secondary | ICD-10-CM | POA: Diagnosis not present

## 2023-04-10 DIAGNOSIS — Z87891 Personal history of nicotine dependence: Secondary | ICD-10-CM | POA: Diagnosis not present

## 2023-04-10 DIAGNOSIS — Z96659 Presence of unspecified artificial knee joint: Secondary | ICD-10-CM | POA: Diagnosis present

## 2023-04-10 DIAGNOSIS — J45909 Unspecified asthma, uncomplicated: Secondary | ICD-10-CM | POA: Diagnosis present

## 2023-04-10 DIAGNOSIS — M549 Dorsalgia, unspecified: Secondary | ICD-10-CM | POA: Diagnosis present

## 2023-04-10 DIAGNOSIS — M1990 Unspecified osteoarthritis, unspecified site: Secondary | ICD-10-CM | POA: Diagnosis present

## 2023-04-10 LAB — COMPREHENSIVE METABOLIC PANEL
ALT: 19 U/L (ref 0–44)
AST: 20 U/L (ref 15–41)
Albumin: 3.5 g/dL (ref 3.5–5.0)
Alkaline Phosphatase: 72 U/L (ref 38–126)
Anion gap: 3 — ABNORMAL LOW (ref 5–15)
BUN: 16 mg/dL (ref 8–23)
CO2: 27 mmol/L (ref 22–32)
Calcium: 8.9 mg/dL (ref 8.9–10.3)
Chloride: 108 mmol/L (ref 98–111)
Creatinine, Ser: 0.91 mg/dL (ref 0.61–1.24)
GFR, Estimated: >60 mL/min (ref >60)
Glucose, Bld: 112 mg/dL — ABNORMAL HIGH (ref 70–99)
Potassium: 4.5 mmol/L (ref 3.5–5.1)
Sodium: 138 mmol/L (ref 135–145)
Total Bilirubin: 0.6 mg/dL (ref 0.3–1.2)
Total Protein: 6.7 g/dL (ref 6.5–8.1)

## 2023-04-10 LAB — CBC WITH DIFFERENTIAL/PLATELET
Abs Immature Granulocytes: 0.01 10*3/uL (ref 0.00–0.07)
Basophils Absolute: 0 10*3/uL (ref 0.0–0.1)
Basophils Relative: 1 %
Eosinophils Absolute: 0.1 10*3/uL (ref 0.0–0.5)
Eosinophils Relative: 4 %
HCT: 36.1 % — ABNORMAL LOW (ref 39.0–52.0)
Hemoglobin: 12.2 g/dL — ABNORMAL LOW (ref 13.0–17.0)
Immature Granulocytes: 0 %
Lymphocytes Relative: 29 %
Lymphs Abs: 1 10*3/uL (ref 0.7–4.0)
MCH: 30 pg (ref 26.0–34.0)
MCHC: 33.8 g/dL (ref 30.0–36.0)
MCV: 88.9 fL (ref 80.0–100.0)
Monocytes Absolute: 0.4 10*3/uL (ref 0.1–1.0)
Monocytes Relative: 10 %
Neutro Abs: 1.9 10*3/uL (ref 1.7–7.7)
Neutrophils Relative %: 56 %
Platelets: 171 10*3/uL (ref 150–400)
RBC: 4.06 MIL/uL — ABNORMAL LOW (ref 4.22–5.81)
RDW: 13.1 % (ref 11.5–15.5)
WBC: 3.4 10*3/uL — ABNORMAL LOW (ref 4.0–10.5)
nRBC: 0 % (ref 0.0–0.2)

## 2023-04-10 LAB — SEDIMENTATION RATE: Sed Rate: 7 mm/hr (ref 0–16)

## 2023-04-10 LAB — C-REACTIVE PROTEIN: CRP: 0.6 mg/dL (ref <1.0)

## 2023-04-10 LAB — HEMOGLOBIN A1C
Hgb A1c MFr Bld: 5.4 % (ref 4.8–5.6)
Mean Plasma Glucose: 108.28 mg/dL

## 2023-04-10 LAB — GLUCOSE, CAPILLARY
Glucose-Capillary: 101 mg/dL — ABNORMAL HIGH (ref 70–99)
Glucose-Capillary: 133 mg/dL — ABNORMAL HIGH (ref 70–99)

## 2023-04-10 LAB — I-STAT CG4 LACTIC ACID, ED: Lactic Acid, Venous: 1.4 mmol/L (ref 0.5–1.9)

## 2023-04-10 MED ORDER — ROSUVASTATIN CALCIUM 5 MG PO TABS
5.0000 mg | ORAL_TABLET | Freq: Every day | ORAL | Status: DC
  Administered 2023-04-10 – 2023-04-12 (×3): 5 mg via ORAL
  Filled 2023-04-10 (×3): qty 1

## 2023-04-10 MED ORDER — HEPARIN SODIUM (PORCINE) 5000 UNIT/ML IJ SOLN
5000.0000 [IU] | Freq: Three times a day (TID) | INTRAMUSCULAR | Status: DC
  Administered 2023-04-10: 5000 [IU] via SUBCUTANEOUS
  Filled 2023-04-10 (×2): qty 1

## 2023-04-10 MED ORDER — IRBESARTAN 300 MG PO TABS
300.0000 mg | ORAL_TABLET | Freq: Every day | ORAL | Status: DC
  Administered 2023-04-10 – 2023-04-12 (×3): 300 mg via ORAL
  Filled 2023-04-10 (×3): qty 1

## 2023-04-10 MED ORDER — DAPAGLIFLOZIN PROPANEDIOL 10 MG PO TABS
10.0000 mg | ORAL_TABLET | Freq: Every day | ORAL | Status: DC
  Administered 2023-04-10 – 2023-04-12 (×3): 10 mg via ORAL
  Filled 2023-04-10 (×3): qty 1

## 2023-04-10 MED ORDER — MORPHINE SULFATE (PF) 4 MG/ML IV SOLN
4.0000 mg | Freq: Once | INTRAVENOUS | Status: AC
  Administered 2023-04-10: 4 mg via INTRAVENOUS
  Filled 2023-04-10: qty 1

## 2023-04-10 MED ORDER — PIPERACILLIN-TAZOBACTAM 3.375 G IVPB 30 MIN
3.3750 g | Freq: Once | INTRAVENOUS | Status: AC
  Administered 2023-04-10: 3.375 g via INTRAVENOUS
  Filled 2023-04-10: qty 50

## 2023-04-10 MED ORDER — AMLODIPINE BESYLATE 5 MG PO TABS
5.0000 mg | ORAL_TABLET | Freq: Every day | ORAL | Status: DC
  Administered 2023-04-10 – 2023-04-12 (×3): 5 mg via ORAL
  Filled 2023-04-10 (×3): qty 1

## 2023-04-10 MED ORDER — LORAZEPAM 1 MG PO TABS
1.0000 mg | ORAL_TABLET | ORAL | Status: AC | PRN
  Administered 2023-04-10: 1 mg via ORAL
  Filled 2023-04-10: qty 1

## 2023-04-10 MED ORDER — INSULIN ASPART 100 UNIT/ML IJ SOLN
0.0000 [IU] | INTRAMUSCULAR | Status: DC
  Administered 2023-04-10 – 2023-04-11 (×2): 1 [IU] via SUBCUTANEOUS
  Administered 2023-04-12: 2 [IU] via SUBCUTANEOUS
  Administered 2023-04-12 (×3): 1 [IU] via SUBCUTANEOUS

## 2023-04-10 MED ORDER — OXYCODONE HCL 5 MG PO TABS
5.0000 mg | ORAL_TABLET | ORAL | Status: DC | PRN
  Administered 2023-04-10 – 2023-04-12 (×8): 5 mg via ORAL
  Filled 2023-04-10 (×8): qty 1

## 2023-04-10 MED ORDER — SENNOSIDES-DOCUSATE SODIUM 8.6-50 MG PO TABS: 1.0000 | ORAL_TABLET | Freq: Every evening | ORAL | Status: DC | PRN

## 2023-04-10 MED ORDER — VANCOMYCIN HCL 2000 MG/400ML IV SOLN
2000.0000 mg | Freq: Once | INTRAVENOUS | Status: AC
  Administered 2023-04-10: 2000 mg via INTRAVENOUS
  Filled 2023-04-10: qty 400

## 2023-04-10 NOTE — Progress Notes (Signed)
ED Pharmacy Antibiotic Sign Off An antibiotic consult was received from an ED provider for vancomycin and zosyn per pharmacy dosing for wound infection/osteo concern. A chart review was completed to assess appropriateness.   The following one time order(s) were placed:  Vancomycin 2g IV x 1 Zosyn 3.375g IV x 1  Further antibiotic and/or antibiotic pharmacy consults should be ordered by the admitting provider if indicated.   Thank you for allowing pharmacy to be a part of this patient's care.   Daylene Posey, [facility]  Clinical Pharmacist 04/10/23 2:31 PM

## 2023-04-10 NOTE — ED Notes (Signed)
ED TO INPATIENT HANDOFF REPORT  ED Nurse Name and Phone #: Marchelle Folks 70  S Name/Age/Gender Robert [name] 67 y.o. male Room/Bed: 033C/033C  Code Status   Code Status: Full Code  Home/SNF/Other Home Patient oriented to: self, place, time, and situation Is this baseline? Yes   Triage Complete: Triage complete  Chief Complaint Acute osteomyelitis of toe, right [facility]) [Z61.096]  Triage Note Dr. Lanice Schwab sent pt in due to an infection in the bones in his right foot. Pt has foot wrapped . Pt has swelling to right foot and ankle.    Allergies No Known Allergies  Level of Care/Admitting Diagnosis ED Disposition     ED Disposition  Admit   Condition  --   Comment  Hospital Area: [facility] [[id number]]  Level of Care: Med-Surg [16]  May admit patient to [facility] or [facility] if equivalent level of care is available:: No  Covid Evaluation: Asymptomatic - no recent exposure (last 10 days) testing not required  Diagnosis: Acute osteomyelitis of toe, right [facility]) [[id number]]  Admitting Physician: Ginnie Smart [[id number]]  Attending Physician: Ninetta Lights, JEFFREY C [[id number]]  Certification:: I certify this patient will need inpatient services for at least 2 midnights  Expected Medical Readiness: 04/13/2023          B Medical/Surgery History Past Medical History:  Diagnosis Date   Allergic rhinitis due to pollen    Arthritis    Asthma    Back pain    Barrett esophagus    Depressive disorder    Diabetes mellitus type 2 in obese    Exposure to hepatitis B    Exposure to hepatitis C    Narcotic abuse (HCC)    pain medications   OSA (obstructive sleep apnea)    on CPAP    Past Surgical History:  Procedure Laterality Date   ABDOMINAL SURGERY     AMPUTATION Right 06/11/2018   Procedure: RIGHT 2ND RAY AMPUTATION;  Surgeon: Nadara Mustard, MD;  Location[facility];  Service: Orthopedics;  Laterality: Right;   AMPUTATION TOE Left 06/02/2022   Procedure: LEFT FOURTH  PARTIAL TOE AMPUTATION;  Surgeon: Edwin Cap, DPM;  Location: [facility] ORS;  Service: Podiatry;  Laterality: Left;   BACK SURGERY     Rods and screws in lumbar area   COLONOSCOPY     ESOPHAGOGASTRODUODENOSCOPY  multiple   HERNIA REPAIR     6   JOINT REPLACEMENT     TOTAL KNEE ARTHROPLASTY  2009   left     A IV Location/Drains/Wounds Patient Lines/Drains/Airways Status     Active Line/Drains/Airways     Name Placement date Placement time Site Days   Peripheral IV 04/10/23 18 G Anterior;Left Forearm 04/10/23  1320  Forearm  less than 1   Incision (Closed) 06/02/22 Foot Left 06/02/22  1437  -- 312            Intake/Output Last 24 hours No intake or output data in the 24 hours ending 04/10/23 1529  Labs/Imaging Results for orders placed or performed during the hospital encounter of 04/10/23 (from the past 48 hour(s))  Comprehensive metabolic panel     Status: Abnormal   Collection Time: 04/10/23 11:25 AM  Result Value Ref Range   Sodium 138 135 - 145 mmol/L   Potassium 4.5 3.5 - 5.1 mmol/L   Chloride 108 98 - 111 mmol/L   CO2 27 22 - 32 mmol/L   Glucose, Bld 112 (H) 70 - 99  mg/dL    Comment: Glucose reference range applies only to samples taken after fasting for at least 8 hours.   BUN 16 8 - 23 mg/dL   Creatinine, Ser 4.01 0.61 - 1.24 mg/dL   Calcium 8.9 8.9 - 02.7 mg/dL   Total Protein 6.7 6.5 - 8.1 g/dL   Albumin 3.5 3.5 - 5.0 g/dL   AST 20 15 - 41 U/L   ALT 19 0 - 44 U/L   Alkaline Phosphatase 72 38 - 126 U/L   Total Bilirubin 0.6 0.3 - 1.2 mg/dL   GFR, Estimated >25 >36 mL/min    Comment: (NOTE) Calculated using the CKD-EPI Creatinine Equation (2021)    Anion gap 3 (L) 5 - 15    Comment: Performed at [facility], [address] [place], [place] [address]  CBC with Differential     Status: Abnormal   Collection Time: 04/10/23 11:25 AM  Result Value Ref Range   WBC 3.4 (L) 4.0 - 10.5 K/uL   RBC 4.06 (L) 4.22 - 5.81 MIL/uL   Hemoglobin 12.2 (L)  13.0 - 17.0 g/dL   HCT 47.4 (L) 25.9 - 56.3 %   MCV 88.9 80.0 - 100.0 fL   MCH 30.0 26.0 - 34.0 pg   MCHC 33.8 30.0 - 36.0 g/dL   RDW 87.5 64.3 - 32.9 %   Platelets 171 150 - 400 K/uL   nRBC 0.0 0.0 - 0.2 %   Neutrophils Relative % 56 %   Neutro Abs 1.9 1.7 - 7.7 K/uL   Lymphocytes Relative 29 %   Lymphs Abs 1.0 0.7 - 4.0 K/uL   Monocytes Relative 10 %   Monocytes Absolute 0.4 0.1 - 1.0 K/uL   Eosinophils Relative 4 %   Eosinophils Absolute 0.1 0.0 - 0.5 K/uL   Basophils Relative 1 %   Basophils Absolute 0.0 0.0 - 0.1 K/uL   Immature Granulocytes 0 %   Abs Immature Granulocytes 0.01 0.00 - 0.07 K/uL    Comment: Performed at [facility], [address], [place], [place] [address]  Sedimentation rate     Status: None   Collection Time: 04/10/23 11:25 AM  Result Value Ref Range   Sed Rate 7 0 - 16 mm/hr    Comment: Performed at [facility], [address], [place], [place] [address]  C-reactive protein     Status: None   Collection Time: 04/10/23 11:25 AM  Result Value Ref Range   CRP 0.6 <1.0 mg/dL    Comment: Performed at [facility], [address], [place], [place] [address]  I-Stat Lactic Acid, ED     Status: None   Collection Time: 04/10/23  1:55 PM  Result Value Ref Range   Lactic Acid, Venous 1.4 0.5 - 1.9 mmol/L   DG Foot Complete Right  Result Date: 04/10/2023 CLINICAL DATA:  10932 Infection 35573. EXAM: RIGHT FOOT COMPLETE - 3+ VIEW COMPARISON:  11/27/2021. FINDINGS: No acute fracture or dislocation. Note is made of distal amputation of second toe at the level of metatarsophalangeal joint. There is mild hallux valgus deformity, similar to the prior study. Since the prior study, there is new focal bone erosion/destruction of the tuft of the distal phalanx of the third toe. There is surrounding soft tissue swelling. Findings favor acute osteomyelitis. Correlate clinically to determine the need for additional imaging. There is asymmetric  moderate-to-severe degenerative changes of the first metatarsophalangeal joint characterized by reduced joint space, subarticular sclerosis/cystic  changes and osteophyte formation. There is irregularity of the articular surface however, no discrete focal new bone erosions noted. Findings favor advanced degenerative changes. Correlate clinically. No radiopaque foreign bodies. IMPRESSION: *Findings concerning for acute osteomyelitis of the distal phalanx of the third toe. *Asymmetric advanced degenerative changes of first metatarsophalangeal joint. Electronically Signed   By: Jules Schick M.D.   On: 04/10/2023 13:12    Pending Labs Unresulted Labs (From admission, onward)     Start     Ordered   04/11/23 0500  Basic metabolic panel  Tomorrow morning,   R        04/10/23 1527   04/11/23 0500  CBC  Tomorrow morning,   R        04/10/23 1527   04/10/23 1524  CBC  (heparin)  Once,   R       Comments: Baseline for heparin therapy IF NOT ALREADY DRAWN.  Notify MD if PLT < 100 K.    04/10/23 1527   04/10/23 1125  Blood Cultures x 2 sites  BLOOD CULTURE X 2,   STAT      04/10/23 1125            Vitals/Pain Today's Vitals   04/10/23 1116 04/10/23 1119 04/10/23 1345 04/10/23 1400  BP:   (!) 128/103   Pulse:   64   Resp:   17   Temp:      TempSrc:      SpO2:   99%   Weight:  (!) 143.8 kg    Height:  5\' 11"  (1.803 m)    PainSc: 6    3     Isolation Precautions No active isolations  Medications Medications  vancomycin (VANCOREADY) IVPB 2000 mg/400 mL (has no administration in time range)  piperacillin-tazobactam (ZOSYN) IVPB 3.375 g (has no administration in time range)  heparin injection 5,000 Units (has no administration in time range)  oxyCODONE (Oxy IR/ROXICODONE) immediate release tablet 5 mg (has no administration in time range)  senna-docusate (Senokot-S) tablet 1 tablet (has no administration in time range)  LORazepam (ATIVAN) tablet 1 mg (1 mg Oral Given 04/10/23 1345)   morphine (PF) 4 MG/ML injection 4 mg (4 mg Intravenous Given 04/10/23 1327)    Mobility walks with device     Focused Assessments     R Recommendations: See Admitting Provider Note  Report given to:   Additional Notes:

## 2023-04-10 NOTE — ED Provider Notes (Signed)
[facility] EMERGENCY DEPARTMENT AT [facility] Provider Note   CSN: [id number] Arrival date & time: 04/10/23  1057     History  Chief Complaint  Patient presents with   Foot Pain    Robert [name] is a 67 y.o. male.  With a history of type 2 diabetes, OSA, anxiety, depression, asthma, arthritis presenting to the ED for evaluation of right second toe pain.  He has been seen by [facility] and was started on doxycycline for an infection of this toe.  He returned to office today and reported that he has seen improvement in the wound of the toe, however approximately 1 week ago he developed a new wound to the dorsum of the toe which has gotten significantly worse since he noticed the wound.  He reports mild pain after walking.  Denies any fevers or chills.  No nausea, vomiting or abdominal pain.  He reports his last A1c was 6.1.  He was sent here to podiatry office for IV antibiotics, admission, MRI of the foot, surgical intervention.  HPI     Home Medications Prior to Admission medications   Medication Sig Start Date End Date Taking? Authorizing Provider  albuterol (VENTOLIN HFA) 108 (90 Base) MCG/ACT inhaler INHALE 1-2 PUFFS BY MOUTH EVERY 6 HOURS AS NEEDED FOR WHEEZE OR SHORTNESS OF BREATH. Patient taking differently: Inhale 1 puff into the lungs every 6 (six) hours as needed for shortness of breath. 09/21/21   Abonza, Maritza, PA-C  amLODipine-olmesartan (AZOR) 5-40 MG tablet Take 1 tablet by mouth daily. 11/22/21   [provider]  APPLE CIDER VINEGAR PO Take 15 mLs by mouth 2 (two) times daily.     [provider]  B Complex Vitamins (B COMPLEX 100 PO) Take 1 capsule by mouth daily.    [provider]  blood glucose meter kit and supplies Dispense based on patient and insurance preference. Use to check glucose fasting in the AM and then after largest meal of the day.. (FOR ICD-10 E10.9, E11.9). 12/04/17   Opalski, Gavin Pound, DO  Blood Glucose  Monitoring Suppl (ONE TOUCH ULTRA MINI) w/Device KIT Use to test blood sugar, test in the morning fasting and test after largest meal of the day. 05/28/17   Opalski, Deborah, DO  CALCIUM-MAGNESIUM-ZINC PO Take 1 tablet by mouth every other day.    [provider]  Cholecalciferol (VITAMIN D3) 5000 units TABS 5,000 IU OTC vitamin D3 daily. Patient taking differently: Take 5,000 Units by mouth daily. 05/08/16   Opalski, Gavin Pound, DO  doxycycline (VIBRA-TABS) 100 MG tablet Take 1 tablet (100 mg total) by mouth 2 (two) times daily for 10 days. 04/05/23 04/15/23  Louann Sjogren, DPM  FARXIGA 10 MG TABS tablet Take 10 mg by mouth daily. 11/03/21   [provider]  GINSENG PO Take 1 tablet by mouth once a week.    [provider]  glucose blood (ONE TOUCH ULTRA TEST) test strip Use to test blood sugar, test in the morning fasting and test after largest meal of the day. 05/28/17   Opalski, Gavin Pound, DO  meloxicam (MOBIC) 15 MG tablet TAKE 1 TABLET BY MOUTH EVERY DAY Patient taking differently: Take 15 mg by mouth daily. 02/13/21   Mayer Masker, PA-C  NARCAN 4 MG/0.1ML LIQD nasal spray kit Place 1 spray into the nose daily as needed (overdose). 08/19/18   [provider]  ondansetron (ZOFRAN) 4 MG tablet Take 1 tablet (4 mg total) by mouth every 8 (  eight) hours as needed for nausea or vomiting. 12/26/21   Louann Sjogren, DPM  [facility] DELICA LANCETS 33G MISC Use for testing blood sugar, test once in the morning fasting and test after largest meal of the day. 05/28/17   Opalski, Deborah, DO  OZEMPIC, 0.25 OR 0.5 MG/DOSE, 2 MG/1.5ML SOPN INJECT 0.25MG  INTO SKIN ONCE WEEKLY FOR 4 WEEKS, THEN INJECT 0.5MG  INTO SKIN ONCE WEEKLY. Patient taking differently: Inject 2 mg into the skin once a week. Inject 0.25mg  into skin once weekly for 4 weeks, then inject 0.5mg  into skin once weekly. 02/20/21   Mayer Masker, PA-C  polyethylene glycol (MIRALAX / GLYCOLAX) 17 g packet Take 17 g by mouth  daily as needed. 06/04/22   Shalhoub, Deno Lunger, MD  Potassium 99 MG TABS Take 99 mg by mouth daily.    [provider]  rosuvastatin (CRESTOR) 5 MG tablet Take 5 mg by mouth every evening. 10/27/21   [provider]  TURMERIC CURCUMIN PO Take 1 tablet by mouth every other day.    [provider]  valACYclovir (VALTREX) 1000 MG tablet TAKE 1 TABLET BY MOUTH DAILY AS NEEDED (FOR OUTBREAKS ONLY). Patient taking differently: Take 500 mg by mouth daily as needed (For outbreaks). 03/06/21   Mayer Masker, PA-C  vitamin B-12 1000 MCG tablet Take 1 tablet (1,000 mcg total) by mouth daily. 04/10/18   Marguerita Merles Latif, DO  vitamin C (ASCORBIC ACID) 500 MG tablet Take 500 mg by mouth daily.    [provider]  VITAMIN E PO Take 1 tablet by mouth daily.    [provider]      Allergies    Patient has no known allergies.    Review of Systems   Review of Systems  Physical Exam Updated Vital Signs BP (!) 128/103   Pulse 64   Temp 97.9 F (36.6 C) (Oral)   Resp 17   Ht 5\' 11"  (1.803 m)   Wt (!) 143.8 kg   SpO2 99%   BMI 44.21 kg/m  Physical Exam Vitals and nursing note reviewed.  Constitutional:      General: He is not in acute distress.    Appearance: Normal appearance. He is normal weight. He is not ill-appearing.  HENT:     Head: Normocephalic and atraumatic.  Pulmonary:     Effort: Pulmonary effort is normal. No respiratory distress.  Abdominal:     General: Abdomen is flat.  Musculoskeletal:        General: Normal range of motion.     Cervical back: Neck supple.  Feet:     Comments: Amputation of second toe of right foot.  Third toe swollen, erythematous with an ulcerated lesion to the plantar surface of the foot.  This probes to bone.  No tracking erythema from the foot. Skin:    General: Skin is warm and dry.  Neurological:     Mental Status: He is alert and oriented to person, place, and time.  Psychiatric:        Mood and  Affect: Mood normal.        Behavior: Behavior normal.        ED Results / Procedures / Treatments   Labs (all labs ordered are listed, but only abnormal results are displayed) Labs Reviewed  COMPREHENSIVE METABOLIC PANEL - Abnormal; Notable for the following components:      Result Value   Glucose, Bld 112 (*)    Anion gap 3 (*)    All  other components within normal limits  CBC WITH DIFFERENTIAL/PLATELET - Abnormal; Notable for the following components:   WBC 3.4 (*)    RBC 4.06 (*)    Hemoglobin 12.2 (*)    HCT 36.1 (*)    All other components within normal limits  CULTURE, BLOOD (ROUTINE X 2)  CULTURE, BLOOD (ROUTINE X 2)  SEDIMENTATION RATE  C-REACTIVE PROTEIN  I-STAT CG4 LACTIC ACID, ED    EKG None  Radiology DG Foot Complete Right  Result Date: 04/10/2023 CLINICAL DATA:  16109 Infection 60454. EXAM: RIGHT FOOT COMPLETE - 3+ VIEW COMPARISON:  11/27/2021. FINDINGS: No acute fracture or dislocation. Note is made of distal amputation of second toe at the level of metatarsophalangeal joint. There is mild hallux valgus deformity, similar to the prior study. Since the prior study, there is new focal bone erosion/destruction of the tuft of the distal phalanx of the third toe. There is surrounding soft tissue swelling. Findings favor acute osteomyelitis. Correlate clinically to determine the need for additional imaging. There is asymmetric moderate-to-severe degenerative changes of the first metatarsophalangeal joint characterized by reduced joint space, subarticular sclerosis/cystic changes and osteophyte formation. There is irregularity of the articular surface however, no discrete focal new bone erosions noted. Findings favor advanced degenerative changes. Correlate clinically. No radiopaque foreign bodies. IMPRESSION: *Findings concerning for acute osteomyelitis of the distal phalanx of the third toe. *Asymmetric advanced degenerative changes of first metatarsophalangeal joint.  Electronically Signed   By: Jules Schick M.D.   On: 04/10/2023 13:12    Procedures Procedures    Medications Ordered in ED Medications  vancomycin (VANCOREADY) IVPB 2000 mg/400 mL (has no administration in time range)  piperacillin-tazobactam (ZOSYN) IVPB 3.375 g (has no administration in time range)  LORazepam (ATIVAN) tablet 1 mg (1 mg Oral Given 04/10/23 1345)  morphine (PF) 4 MG/ML injection 4 mg (4 mg Intravenous Given 04/10/23 1327)    ED Course/ Medical Decision Making/ A&P Clinical Course as of 04/10/23 1446  Wed Apr 10, 2023  1420 Spoke with podiatry Dr. Annamary Rummage.  He recommends broad-spectrum IV antibiotics, admission to hospitalist, n.p.o. after midnight [AS]  1435 Spoke with internal medicine who will admit [AS]    Clinical Course User Index [AS] Stephani Janak, Edsel Petrin, PA-C                                 Medical Decision Making  This patient presents to the ED for concern of diabetic toe wound of the right foot, this involves an extensive number of treatment options, and is a complaint that carries with it a high risk of complications and morbidity.  The differential diagnosis includes osteomyelitis, osteonecrosis, cellulitis, sepsis  My initial workup includes labs, imaging  Additional history obtained from: Nursing notes from this visit. Previous records within EMR system podiatry office visit with Dr. Ralene Cork prior to arrival  I ordered, reviewed and interpreted labs which include: CMP, CBC, cultures, lactate, ESR, CRP  I ordered imaging studies including x-ray right foot, MRI right foot I independently visualized and interpreted imaging which showed x-ray foot concerning for osteomyelitis, MRI foot pending at the time of admission I agree with the radiologist interpretation  Consultations Obtained:  I requested consultation with podiatry Dr. Annamary Rummage,  and discussed lab and imaging findings as well as pertinent plan - they recommend: Broad-spectrum  antibiotics, n.p.o. after midnight, admission to hospitalist  I requested consultation with internal medicine residency group.  They will admit.  Afebrile, hemodynamically stable.  67 year old male presenting for evaluation of right foot pain.  Patient presents from podiatry office with request for admission to hospital service for likely third digit amputation.  Patient initially had a wound to the digit approximately 2 weeks ago.  Was on doxycycline, however symptoms continue to worsen.  On exam, the third digit is swollen and erythematous with an ulcerated lesion to the plantar surface of the distal phalanx.  This lesion probes to bone.  X-ray imaging concerning for osteomyelitis.  Patient is otherwise stable.  No leukocytosis, lactic acidosis, elevation in ESR or CRP to suggest progression of sepsis.  Patient has strong pulses to bilateral DP.  Podiatry was consulted who will evaluate patient.  They request broad-spectrum IV antibiotics.  Will admit to hospitalist.  Patient is in agreement with this plan.  Stable at the time of admission.  Patient's case discussed with Dr. Eloise Harman who agrees with plan for admission  Note: Portions of this report may have been transcribed using voice recognition software. Every effort was made to ensure accuracy; however, inadvertent computerized transcription errors may still be present.        Final Clinical Impression(s) / ED Diagnoses Final diagnoses:  Diabetic foot infection (HCC)  Osteomyelitis of right foot, unspecified type [facility])    Rx / DC Orders ED Discharge Orders     None         Michelle Piper, Cordelia Poche 04/10/23 1446    Rondel Baton, MD 04/12/23 [phone]

## 2023-04-10 NOTE — Consult Note (Signed)
PODIATRY CONSULTATION  NAME Robert [name] MRN [id number] DOB [birth year] DOA 04/10/2023   Reason for consult:  Chief Complaint  Patient presents with   Foot Pain    Attending/Consulting physician: Jolayne Haines MD  History of present illness from A Schutt PA: 67 y.o. male with a history of type 2 diabetes, OSA, anxiety, depression, asthma, arthritis presented for evaluation of right  third toe pain redness and swelling. He was seen by [facility] and was started on doxycycline for an infection of this toe. approximately 1 week ago he developed a new wound to the tuft of the toe which has gotten significantly worse since he noticed the wound. He reports mild pain after walking. Denies any fevers or chills. No nausea, vomiting or abdominal pain. He reports his last A1c was 6.1. He was see by Dr. Ralene Cork this am and sent to ED today for IV antibiotics, admission, MRI of the foot, surgical intervention.   Past Medical History:  Diagnosis Date   Allergic rhinitis due to pollen    Arthritis    Asthma    Back pain    Barrett esophagus    Depressive disorder    Diabetes mellitus type 2 in obese    Exposure to hepatitis B    Exposure to hepatitis C    Narcotic abuse (HCC)    pain medications   OSA (obstructive sleep apnea)    on CPAP        Latest Ref Rng & Units 04/10/2023   11:25 AM 06/04/2022    7:14 AM 06/03/2022    5:19 AM  CBC  WBC 4.0 - 10.5 K/uL 3.4  3.8  3.9   Hemoglobin 13.0 - 17.0 g/dL 86.5  78.4  69.6   Hematocrit 39.0 - 52.0 % 36.1  39.7  39.5   Platelets 150 - 400 K/uL 171  145  149        Latest Ref Rng & Units 04/10/2023   11:25 AM 06/04/2022    7:14 AM 06/03/2022    5:19 AM  BMP  Glucose 70 - 99 mg/dL 295  284  97   BUN 8 - 23 mg/dL 16  21  17    Creatinine 0.61 - 1.24 mg/dL 1.32  4.40  1.02   Sodium 135 - 145 mmol/L 138  136  138   Potassium 3.5 - 5.1 mmol/L 4.5  4.1  4.1   Chloride 98 - 111 mmol/L 108  105  109   CO2 22 - 32 mmol/L 27  24  24     Calcium 8.9 - 10.3 mg/dL 8.9  9.0  9.0       Physical Exam: Lower Extremity Exam Vasc: R - PT palpable, DP palpable. Cap refill < 3 sec to digits  L - PT palpable, DP palpable. Cap refill <3 sec to digits  Derm: R - Ulcer distal tuft of right 3rd toe (pt has prior 2nd toe amputation. Erythema and edema noted of the toe. Drainage from the ulcer + probe to bone      L - Normal temp/texture/turgor with no open lesion or clinical signs of infection  MSK:  R - Edema of 3rd toe, prior 2nd toe amputation  L -  No gross deformities. Compartments soft, non-tender, compressible  Neuro: R - Gross sensation absent, loss of protective sensation. Gross motor function intact   L - Gross sensation absent, loss of protective sensation.  Gross motor function intact    ASSESSMENT/PLAN  OF CARE 67 y.o. male with PMHx significant for  type 2 diabetes, OSA, anxiety, depression, asthma, arthritis  with right 3rd toe ulceration with acute osteomyelitis of the digit.   AF VSS WBC 3.4  ESR / CRP: 7 / 0.6 MR R foot WO contrast: 1. Osteomyelitis of the distal phalanx of the 3rd toe with overlying soft tissue ulceration and swelling. 2. No evidence of soft tissue abscess. 3. Interval amputation of the 2nd toe. 4. Progressive arthropathic changes at the 1st metatarsophalangeal and Lisfranc joints.  - NPO past MN for OR tomorrow at 1630 for R 3rd toe amputation  - Continue IV abx broad spectrum pending further culture data - Anticoagulation: Please hold for OR tomorrow - Wound care: Betadine gauze dressing to right 3rd toe for now - WB status: WBAT to right foot post op - Will continue to follow   Thank you for the consult.  Please contact me directly with any questions or concerns.           Corinna Gab, DPM [facility]    [address] [place], [place] [address]                Office [phone]  Fax [phone]

## 2023-04-10 NOTE — H&P (Signed)
Date: 04/10/2023               Patient Name:  Robert [name] MRN: [id number]  DOB: [birth year] Age / Sex: 67 y.o., male   PCP: [facility]         Medical Service: Internal Medicine Teaching Service         Attending Physician: Dr. Ginnie Smart, MD    First Contact: Dr. Philomena Doheny, MD Pager: [phone]   Second Contact: Dr. Gwenevere Abbot, MD Pager: [phone]       After Hours (After 5p/  First Contact Pager: [phone]  weekends / holidays): Second Contact Pager: [phone]   Chief Complaint: right foot wound  History of Present Illness:  Patient is a 67 y.o. with a PMH of HTN, T2DM complicated by diabetic foot infections, HLD, OSA on CPAP, and chronic back pain who presents for a worsening right foot wound and admitted for acute osteomyelitis of the third toe (second toe previously amputated).      History obtained from patient and patient's spouse, Revonda Standard, who is at bedside.   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  - Patient seen by [facility] about two weeks ago and was started on doxycycline for an infection on the third toe of the right foot. Took doxycyline consistently but noticed worsening pain and redness, developed a new wound on the toe about a week ago.  - Patient seen by [facility] this morning for a wound check with worsening pain and drainage.  - Toe was dressed in betadine, recommended off-loading with surgical shoe and recommended patient go to ED for IV antibiotics and further evaluation for likely amputation.  - Patient denied nausea, vomiting, sick contacts, fevers or chills today, but does endorse fatigue and fevers up to 102.7 a few weeks ago. PCP unable to determine source of fevers.  - Of note, patient and his wife expressed frustration and disappointment in repeat need for amputations. Would like to know why patient is susceptible to infections and are fearful of  needing future amputations.  - Patient and spouse question need for his medications and do not think he has active diabetes or blood pressure problems. Would appreciate some clarity regarding what may be impacting patient's health.   ED course:  Vitals on admission stable BP 137/85, HR 69, RR 16, 99% on room air, afebrile. Patient reported pain on R 3rd digit as 6 out of 10. CBC with WBC 3.4, RBC 4.06, Hgb 12.2, HCT 36.1. CMP unremarkable, glucose 112. Lactic acid WNL at 1.4. DG and MRI of right foot concerning for acute osteomyelitis of the distal phalanx of 3rd toe. Podiatry service consulted. Given IV morphine 4 mg 1x, lorazepam 1 mg tablet, oxycodone 5 mg, started IV vancomycin.    PMH T2DM - patient and wife believe this is not an active problem, didn't realize he is on diabetes medications OSA - wears CPAP at night Chronic Pain - takes tramadol q6H for back pain and associated leg pain  HTN - believes blood pressures are under control on home meds  Meds:  Patient's spouse reported patient is also taking potassium supplement for leg cramps.  Current Outpatient Medications  Medication Instructions   albuterol (VENTOLIN HFA) 108 (90 Base) MCG/ACT inhaler INHALE 1-2 PUFFS BY MOUTH EVERY 6 HOURS AS NEEDED FOR WHEEZE OR SHORTNESS OF BREATH.   amLODipine-olmesartan (AZOR) 5-40 MG tablet 1  tablet, Oral, Daily   APPLE CIDER VINEGAR PO 15 mLs, Oral, 2 times daily   ascorbic acid (VITAMIN C) 500 mg, Oral, Daily   B Complex Vitamins (B COMPLEX 100 PO) 1 capsule, Oral, Daily   blood glucose meter kit and supplies Dispense based on patient and insurance preference. Use to check glucose fasting in the AM and then after largest meal of the day.. (FOR ICD-10 E10.9, E11.9).   Blood Glucose Monitoring Suppl (ONE TOUCH ULTRA MINI) w/Device KIT Use to test blood sugar, test in the morning fasting and test after largest meal of the day.   CALCIUM-MAGNESIUM-ZINC PO 1 tablet, Oral, Every other day    Cholecalciferol (VITAMIN D3) 5000 units TABS 5,000 IU OTC vitamin D3 daily.   cyanocobalamin 1,000 mcg, Oral, Daily   doxycycline (VIBRA-TABS) 100 mg, Oral, 2 times daily   Farxiga 10 mg, Oral, Daily   GINSENG PO 1 tablet, Oral, Weekly   glucose blood (ONE TOUCH ULTRA TEST) test strip Use to test blood sugar, test in the morning fasting and test after largest meal of the day.   meloxicam (MOBIC) 15 MG tablet TAKE 1 TABLET BY MOUTH EVERY DAY   NARCAN 4 MG/0.1ML LIQD nasal spray kit 1 spray, Nasal, Daily PRN   ondansetron (ZOFRAN) 4 mg, Oral, Every 8 hours PRN   ONETOUCH DELICA LANCETS 33G MISC Use for testing blood sugar, test once in the morning fasting and test after largest meal of the day.   OZEMPIC, 0.25 OR 0.5 MG/DOSE, 2 MG/1.5ML SOPN INJECT 0.25MG  INTO SKIN ONCE WEEKLY FOR 4 WEEKS, THEN INJECT 0.5MG  INTO SKIN ONCE WEEKLY.   polyethylene glycol (MIRALAX / GLYCOLAX) 17 g, Oral, Daily PRN   Potassium 99 mg, Oral, Daily   rosuvastatin (CRESTOR) 5 mg, Oral, Every evening   TURMERIC CURCUMIN PO 1 tablet, Oral, Every other day   valACYclovir (VALTREX) 1000 MG tablet TAKE 1 TABLET BY MOUTH DAILY AS NEEDED (FOR OUTBREAKS ONLY).   VITAMIN E PO 1 tablet, Oral, Daily    Allergies: Allergies as of 04/10/2023   (No Known Allergies)   Past Medical History:  Diagnosis Date   Allergic rhinitis due to pollen    Arthritis    Asthma    Back pain    Barrett esophagus    Depressive disorder    Diabetes mellitus type 2 in obese    Exposure to hepatitis B    Exposure to hepatitis C    Narcotic abuse (HCC)    pain medications   OSA (obstructive sleep apnea)    on CPAP    Family History:  Family History  Problem Relation Age of Onset   Allergies Mother    Early death Father    Thyroid disease Sister    Colon cancer Neg Hx    Stomach cancer Neg Hx     Social History: Lives with: spouse and children Activity: Needs some assistance with ADLs and IADLs PCP: [facility],  PCP Lance Bosch ANP Job: Retired from [employer], has been on disability for some years Alcohol: denies Tobacco use: denies Illicit drug use: denies   Physical Exam: Blood pressure 127/60, pulse 63, temperature 98 F (36.7 C), temperature source Oral, resp. rate 16, height 5\' 11"  (1.803 m), weight (!) 143.8 kg, SpO2 99%. General: Patient resting in bed in no acute distress, able to ask and answer questions appropriately CV: RRR, no r/b/g, no lower extremity edema Pulmonary: clear lungs bilaterally, normal respiratory effort on room air Abdominal:  soft, non-tender, non-distended MSK: able to wiggle toes when prompted on both feet, dorsalis pedal pulses intact bilaterally; dorsal surface of feet cold to touch; third R toe warm to touch, erythematous, edematous, with skin atrophy, yellow crusting, and a depression/ulcer on, unable to express anything from the toe with applied pressure  Skin: warm and dry Neuro: alert and oriented, no acute deficits observed Psych: Normal mood and affect  Labs:    Latest Ref Rng & Units 04/10/2023   11:25 AM 06/04/2022    7:14 AM 06/03/2022    5:19 AM  CMP  Glucose 70 - 99 mg/dL 469  629  97   BUN 8 - 23 mg/dL 16  21  17    Creatinine 0.61 - 1.24 mg/dL 5.28  4.13  2.44   Sodium 135 - 145 mmol/L 138  136  138   Potassium 3.5 - 5.1 mmol/L 4.5  4.1  4.1   Chloride 98 - 111 mmol/L 108  105  109   CO2 22 - 32 mmol/L 27  24  24    Calcium 8.9 - 10.3 mg/dL 8.9  9.0  9.0   Total Protein 6.5 - 8.1 g/dL 6.7  6.6  6.6   Total Bilirubin 0.3 - 1.2 mg/dL 0.6  0.9  0.7   Alkaline Phos 38 - 126 U/L 72  61  62   AST 15 - 41 U/L 20  19  20    ALT 0 - 44 U/L 19  21  21         Latest Ref Rng & Units 04/10/2023   11:25 AM 06/04/2022    7:14 AM 06/03/2022    5:19 AM  CBC  WBC 4.0 - 10.5 K/uL 3.4  3.8  3.9   Hemoglobin 13.0 - 17.0 g/dL 01.0  27.2  53.6   Hematocrit 39.0 - 52.0 % 36.1  39.7  39.5   Platelets 150 - 400 K/uL 171  145  149     I-Stat Lactic Acid,  venous: 1.4 mmol/L Blood cultures: pending Sedimentation rate: pending CRP: pending  Imaging: EKG: no EKG on admission   DG Foot Right 9/25:  IMPRESSION: 1. Findings concerning for acute osteomyelitis of the distal phalanx of the third toe. 2. Asymmetric advanced degenerative changes of first metatarsophalangeal joint.  MR right foot w/o contrast 9/25: IMPRESSION: 1. Osteomyelitis of the distal phalanx of the 3rd toe with overlying soft tissue ulceration and swelling. 2. No evidence of soft tissue abscess. 3. Interval amputation of the 2nd toe. 4. Progressive arthropathic changes at the 1st metatarsophalangeal and Lisfranc joints.  Assessment & Plan by Problem: Principal Problem:   Acute osteomyelitis of toe, right (HCC) Active Problems:   Morbid obesity (HCC)   OSA (obstructive sleep apnea)   Secondary hypertension Patient is a 67 y.o. with a PMH of HTN, T2DM complicated by diabetic foot infections and history of toe amputations, HLD, OSA, and chronic back pain who presents for a worsening right foot wound and admitted for acute osteomyelitis of the third toe (second toe previously amputated). Etiology likely related to diabetes, neuropathy, and poor wound healing. Will rule out other circulatory causes.   Imaging and physical exam consistent with infection requiring IV antibiotics and surgical intervention. Per podiatry, patient NPO midnight, plan for third digit amputation on 9/26.   Plan:  - NPO at midnight - Holding VTE prophylaxis for OR tomorrow - Continue IV Vancomycin and IV Zosyn pending blood cultures - Follow podiatry reccs for wound care after R  3rd toe amputation (scheduled for 1630) - WBAT to right foot post op - PT/OT eval/treat - Follow up ABIs - May need CTA with run off to better evaluate LE vasculature to determine if poor circulation contributing - 1x dose of Oxy 5 mg for moderate to severe pain as needed  T2DM  Chronic. Per patient, last A1C < 6. SSI  sensitive, Follow up A1C.  - Continue daily Farxiga  HTN  Chronic. Normal kidney function noted.  - Start amlodipine 10, ibersartan 300  - Monitor daily serum Cr  OSA Chronic. CPAP ordered.   Diet: NPO at midnight DVT prophylaxis: sq heparin Code status: Full Code  Dispo: Admit patient to Observation with expected length of stay less than 2 midnights.  Signed: Philomena Doheny, MD 04/10/2023, 4:39 PM  After 5pm on weekdays and 1pm on weekends: On Call pager: [phone]

## 2023-04-10 NOTE — H&P (Incomplete)
Internal Medicine Teaching Service Attending Note Date: 04/10/2023  Patient name: Robert [name]  Medical record number: [id number]  Date of birth: Aug 18, 1955   I have seen and evaluated Robert [name] and discussed their care with the Residency Team.   67 yo M with hx of DM2 (last A1C 6.1%), HTN, comes in with ~3 weeks of R 3rd toe swelling, and development of an os. He was seen by podiatry and started on doxy but has worsened. He was told his os has visible bone.  He had fever ~ 2 weeks ago.   Pt had previous amputations of L 2/3/4 toes as well a R 2nd toe.  NKMA  ROS- fatigue, no change in vision, pain with ambulation in foot, clear d/c from wound, no proximal erythema, decreased appetite. Normal sensation, urinary frequency, nocturia, normal bowel movements.   Physical Exam: Blood pressure 127/60, pulse 63, temperature 98 F (36.7 C), temperature source Oral, resp. rate 16, height 5\' 11"  (1.803 m), weight (!) 143.8 kg, SpO2 99%. General appearance: alert, cooperative, no distress, and morbidly obese Throat: normal findings: oropharynx pink & moist without lesions or evidence of thrush Neck: no adenopathy and supple, symmetrical, trachea midline Resp: clear to auscultation bilaterally Cardio: regular rate and rhythm GI: normal findings: bowel sounds normal and soft, non-tender Extremities: os has minimal d/c, mild proximal edema, no proximal erythema.   Media Information  Document Information  Photos    04/10/2023 15:36  Attached To:  Hospital Encounter on 04/10/23  Source Information  Ginnie Smart, MD  [facility]  Document History    Lab results: Results for orders placed or performed during the hospital encounter of 04/10/23 (from the past 24 hour(s))  Comprehensive metabolic panel     Status: Abnormal   Collection Time: 04/10/23 11:25 AM  Result Value Ref Range   Sodium 138 135 - 145 mmol/L   Potassium 4.5 3.5 - 5.1 mmol/L   Chloride 108 98 - 111 mmol/L    CO2 27 22 - 32 mmol/L   Glucose, Bld 112 (H) 70 - 99 mg/dL   BUN 16 8 - 23 mg/dL   Creatinine, Ser 6.43 0.61 - 1.24 mg/dL   Calcium 8.9 8.9 - 32.9 mg/dL   Total Protein 6.7 6.5 - 8.1 g/dL   Albumin 3.5 3.5 - 5.0 g/dL   AST 20 15 - 41 U/L   ALT 19 0 - 44 U/L   Alkaline Phosphatase 72 38 - 126 U/L   Total Bilirubin 0.6 0.3 - 1.2 mg/dL   GFR, Estimated >51 >88 mL/min   Anion gap 3 (L) 5 - 15  CBC with Differential     Status: Abnormal   Collection Time: 04/10/23 11:25 AM  Result Value Ref Range   WBC 3.4 (L) 4.0 - 10.5 K/uL   RBC 4.06 (L) 4.22 - 5.81 MIL/uL   Hemoglobin 12.2 (L) 13.0 - 17.0 g/dL   HCT 41.6 (L) 60.6 - 30.1 %   MCV 88.9 80.0 - 100.0 fL   MCH 30.0 26.0 - 34.0 pg   MCHC 33.8 30.0 - 36.0 g/dL   RDW 60.1 09.3 - 23.5 %   Platelets 171 150 - 400 K/uL   nRBC 0.0 0.0 - 0.2 %   Neutrophils Relative % 56 %   Neutro Abs 1.9 1.7 - 7.7 K/uL   Lymphocytes Relative 29 %   Lymphs Abs 1.0 0.7 - 4.0 K/uL   Monocytes Relative 10 %   Monocytes Absolute 0.4 0.1 -  1.0 K/uL   Eosinophils Relative 4 %   Eosinophils Absolute 0.1 0.0 - 0.5 K/uL   Basophils Relative 1 %   Basophils Absolute 0.0 0.0 - 0.1 K/uL   Immature Granulocytes 0 %   Abs Immature Granulocytes 0.01 0.00 - 0.07 K/uL  Sedimentation rate     Status: None   Collection Time: 04/10/23 11:25 AM  Result Value Ref Range   Sed Rate 7 0 - 16 mm/hr  C-reactive protein     Status: None   Collection Time: 04/10/23 11:25 AM  Result Value Ref Range   CRP 0.6 <1.0 mg/dL  I-Stat Lactic Acid, ED     Status: None   Collection Time: 04/10/23  1:55 PM  Result Value Ref Range   Lactic Acid, Venous 1.4 0.5 - 1.9 mmol/L    Imaging results:  DG Foot Complete Right  Result Date: 04/10/2023 CLINICAL DATA:  30865 Infection 78469. EXAM: RIGHT FOOT COMPLETE - 3+ VIEW COMPARISON:  11/27/2021. FINDINGS: No acute fracture or dislocation. Note is made of distal amputation of second toe at the level of metatarsophalangeal joint. There is  mild hallux valgus deformity, similar to the prior study. Since the prior study, there is new focal bone erosion/destruction of the tuft of the distal phalanx of the third toe. There is surrounding soft tissue swelling. Findings favor acute osteomyelitis. Correlate clinically to determine the need for additional imaging. There is asymmetric moderate-to-severe degenerative changes of the first metatarsophalangeal joint characterized by reduced joint space, subarticular sclerosis/cystic changes and osteophyte formation. There is irregularity of the articular surface however, no discrete focal new bone erosions noted. Findings favor advanced degenerative changes. Correlate clinically. No radiopaque foreign bodies. IMPRESSION: *Findings concerning for acute osteomyelitis of the distal phalanx of the third toe. *Asymmetric advanced degenerative changes of first metatarsophalangeal joint. Electronically Signed   By: Jules Schick M.D.   On: 04/10/2023 13:12    Assessment and Plan: I agree with the formulated Assessment and Plan with the following changes:  Diabetic foot infection DM2 HTN OSA  Agree with vanco/zosyn Appreciate podiatry eval Check CRP and ESR He needs vascular w/u- ABIs at a minimum.  Continue statin,  diabetic rx (SSI in hospital) Repeat A1C Will do our best to work with family to ensure they understand plan.

## 2023-04-10 NOTE — ED Triage Notes (Signed)
Dr. Lanice Schwab sent pt in due to an infection in the bones in his right foot. Pt has foot wrapped . Pt has swelling to right foot and ankle.

## 2023-04-10 NOTE — Plan of Care (Signed)

## 2023-04-10 NOTE — Progress Notes (Signed)
Subjective:  Patient ID: Robert [name], male    DOB: 02/26/1956,   MRN: [id number]  Chief Complaint  Patient presents with   Wound Check    67 y.o. male presents for follow-up of removal of nail and wound check. Patient had called in several days ago for worsening of the toe and had sent in refill of doxycycline. Relates it is still not improving and getting more drainage now from a wound on the bottom of the toe.  Patient is diabetic and last A1c was  Lab Results  Component Value Date   HGBA1C 5.1 06/01/2022   .   PCP:  [facility]    . Denies any other pedal complaints. Denies n/v/f/c.   Past Medical History:  Diagnosis Date   Allergic rhinitis due to pollen    Arthritis    Asthma    Back pain    Barrett esophagus    Depressive disorder    Diabetes mellitus type 2 in obese    Exposure to hepatitis B    Exposure to hepatitis C    Narcotic abuse (HCC)    pain medications   OSA (obstructive sleep apnea)    on CPAP     Objective:  Physical Exam: Vascular: DP/PT pulses 2/4 bilateral. CFT <3 seconds. Normal hair growth on digits. No edema.  Skin. No lacerations or abrasions bilateral feet. Ulcer noted to dorsum of third digit has healed and nail bed well healed. However now new areas noted in previous hyperkeratotic lesion is open and draining. There is probe to bone and malodor. Erythema and edema surrounding the toe. Wound measures about 1 cm x 0.7 cm x 0.5 cm.   Musculoskeletal: MMT 5/5 bilateral lower extremities in DF, PF, Inversion and Eversion. Deceased ROM in DF of ankle joint. Partial second and third digit amputation and fourth digit amputation on left . Second digit amputation ont he right  Neurological: Sensation intact to light touch.   Assessment:   1. 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 Diabetes mellitus due to underlying condition with diabetic autonomic neuropathy, unspecified whether long term insulin  use (HCC)        Plan:  Patient was evaluated and treated and all questions answered. Ulcer right third digit dorsally healed. New ulceration noted to distal second digit with necrosis of bone.  -X-rays with osseous erosion noted to the distal phalanx of the second digit.  -Dressed with betadine , DSD. -Wound culture obtained.  -Off-loading with surgical shoe. -Discussed with patient given worsening or wound on oral antibiotics and appearance he will need to go to the ED for IV antibiotics, MRI and likely amputation of the second toe.  Patient expressed understanding and will head that way. Discussed case with Dr. Annamary Rummage who will follow patient.       Return if symptoms worsen or fail to improve.   Return if symptoms worsen or fail to improve.   Louann Sjogren, DPM

## 2023-04-11 ENCOUNTER — Encounter (HOSPITAL_COMMUNITY)
Admission: EM | Disposition: A | Discharge status: Home / Self Care | Payer: Self-pay | Source: Ambulatory Visit | Attending: Infectious Diseases

## 2023-04-11 ENCOUNTER — Encounter (HOSPITAL_COMMUNITY): Payer: Self-pay | Admitting: Infectious Diseases

## 2023-04-11 ENCOUNTER — Inpatient Hospital Stay (HOSPITAL_COMMUNITY): Payer: Medicare Other | Admitting: Anesthesiology

## 2023-04-11 ENCOUNTER — Inpatient Hospital Stay (HOSPITAL_COMMUNITY): Payer: Medicare Other

## 2023-04-11 ENCOUNTER — Other Ambulatory Visit: Payer: Self-pay

## 2023-04-11 DIAGNOSIS — M869 Osteomyelitis, unspecified: Secondary | ICD-10-CM

## 2023-04-11 DIAGNOSIS — L97509 Non-pressure chronic ulcer of other part of unspecified foot with unspecified severity: Secondary | ICD-10-CM | POA: Diagnosis not present

## 2023-04-11 DIAGNOSIS — E1151 Type 2 diabetes mellitus with diabetic peripheral angiopathy without gangrene: Secondary | ICD-10-CM | POA: Diagnosis not present

## 2023-04-11 DIAGNOSIS — E1169 Type 2 diabetes mellitus with other specified complication: Secondary | ICD-10-CM

## 2023-04-11 DIAGNOSIS — M86171 Other acute osteomyelitis, right ankle and foot: Secondary | ICD-10-CM | POA: Diagnosis not present

## 2023-04-11 DIAGNOSIS — E11628 Type 2 diabetes mellitus with other skin complications: Secondary | ICD-10-CM | POA: Diagnosis not present

## 2023-04-11 DIAGNOSIS — L089 Local infection of the skin and subcutaneous tissue, unspecified: Secondary | ICD-10-CM | POA: Diagnosis not present

## 2023-04-11 HISTORY — PX: AMPUTATION TOE: SHX6595

## 2023-04-11 LAB — CULTURE, BLOOD (ROUTINE X 2)

## 2023-04-11 LAB — BASIC METABOLIC PANEL
Anion gap: 5 (ref 5–15)
BUN: 17 mg/dL (ref 8–23)
CO2: 25 mmol/L (ref 22–32)
Calcium: 8.4 mg/dL — ABNORMAL LOW (ref 8.9–10.3)
Chloride: 108 mmol/L (ref 98–111)
Creatinine, Ser: 0.98 mg/dL (ref 0.61–1.24)
GFR, Estimated: >60 mL/min (ref >60)
Glucose, Bld: 119 mg/dL — ABNORMAL HIGH (ref 70–99)
Potassium: 4.3 mmol/L (ref 3.5–5.1)
Sodium: 138 mmol/L (ref 135–145)

## 2023-04-11 LAB — GLUCOSE, CAPILLARY
Glucose-Capillary: 101 mg/dL — ABNORMAL HIGH (ref 70–99)
Glucose-Capillary: 120 mg/dL — ABNORMAL HIGH (ref 70–99)
Glucose-Capillary: 135 mg/dL — ABNORMAL HIGH (ref 70–99)
Glucose-Capillary: 146 mg/dL — ABNORMAL HIGH (ref 70–99)
Glucose-Capillary: 66 mg/dL — ABNORMAL LOW (ref 70–99)
Glucose-Capillary: 80 mg/dL (ref 70–99)
Glucose-Capillary: 86 mg/dL (ref 70–99)
Glucose-Capillary: 93 mg/dL (ref 70–99)

## 2023-04-11 LAB — CBC
HCT: 35.5 % — ABNORMAL LOW (ref 39.0–52.0)
Hemoglobin: 11.7 g/dL — ABNORMAL LOW (ref 13.0–17.0)
MCH: 30 pg (ref 26.0–34.0)
MCHC: 33 g/dL (ref 30.0–36.0)
MCV: 91 fL (ref 80.0–100.0)
Platelets: 158 10*3/uL (ref 150–400)
RBC: 3.9 MIL/uL — ABNORMAL LOW (ref 4.22–5.81)
RDW: 13.2 % (ref 11.5–15.5)
WBC: 4.4 10*3/uL (ref 4.0–10.5)
nRBC: 0 % (ref 0.0–0.2)

## 2023-04-11 LAB — VAS US ABI WITH/WO TBI
Left ABI: 1.14
Right ABI: 1.12

## 2023-04-11 LAB — MRSA NEXT GEN BY PCR, NASAL: MRSA by PCR Next Gen: NOT DETECTED

## 2023-04-11 SURGERY — AMPUTATION, TOE
Anesthesia: Monitor Anesthesia Care | Site: Toe | Laterality: Right

## 2023-04-11 MED ORDER — DEXAMETHASONE SODIUM PHOSPHATE 10 MG/ML IJ SOLN
INTRAMUSCULAR | Status: DC | PRN
  Administered 2023-04-11: 10 mg via INTRAVENOUS

## 2023-04-11 MED ORDER — 0.9 % SODIUM CHLORIDE (POUR BTL) OPTIME
TOPICAL | Status: DC | PRN
  Administered 2023-04-11: 1000 mL

## 2023-04-11 MED ORDER — CHLORHEXIDINE GLUCONATE 0.12 % MT SOLN
15.0000 mL | Freq: Once | OROMUCOSAL | Status: AC
  Administered 2023-04-11: 15 mL via OROMUCOSAL
  Filled 2023-04-11: qty 15

## 2023-04-11 MED ORDER — LIDOCAINE HCL 2 % IJ SOLN
INTRAMUSCULAR | Status: AC
  Filled 2023-04-11: qty 20

## 2023-04-11 MED ORDER — MIDAZOLAM HCL 2 MG/2ML IJ SOLN
INTRAMUSCULAR | Status: AC
  Filled 2023-04-11: qty 2

## 2023-04-11 MED ORDER — PROPOFOL 10 MG/ML IV BOLUS
INTRAVENOUS | Status: DC | PRN
  Administered 2023-04-11: 100 ug/kg/min via INTRAVENOUS
  Administered 2023-04-11 (×2): 30 mg via INTRAVENOUS

## 2023-04-11 MED ORDER — BUPIVACAINE HCL (PF) 0.5 % IJ SOLN
INTRAMUSCULAR | Status: AC
  Filled 2023-04-11: qty 30

## 2023-04-11 MED ORDER — FENTANYL CITRATE (PF) 250 MCG/5ML IJ SOLN
INTRAMUSCULAR | Status: AC
  Filled 2023-04-11: qty 5

## 2023-04-11 MED ORDER — PHENYLEPHRINE 80 MCG/ML (10ML) SYRINGE FOR IV PUSH (FOR BLOOD PRESSURE SUPPORT)
PREFILLED_SYRINGE | INTRAVENOUS | Status: DC | PRN
  Administered 2023-04-11 (×2): 80 ug via INTRAVENOUS

## 2023-04-11 MED ORDER — ONDANSETRON HCL 4 MG/2ML IJ SOLN
INTRAMUSCULAR | Status: DC | PRN
  Administered 2023-04-11: 4 mg via INTRAVENOUS

## 2023-04-11 MED ORDER — LIDOCAINE HCL 2 % IJ SOLN
INTRAMUSCULAR | Status: DC | PRN
  Administered 2023-04-11: 5 mL via INTRADERMAL

## 2023-04-11 MED ORDER — MIDAZOLAM HCL 2 MG/2ML IJ SOLN
INTRAMUSCULAR | Status: DC | PRN
  Administered 2023-04-11: 2 mg via INTRAVENOUS

## 2023-04-11 MED ORDER — BUPIVACAINE HCL (PF) 0.5 % IJ SOLN
INTRAMUSCULAR | Status: DC | PRN
  Administered 2023-04-11: 5 mL

## 2023-04-11 MED ORDER — DEXTROSE 50 % IV SOLN
INTRAVENOUS | Status: AC
  Administered 2023-04-11: 12.5 g via INTRAVENOUS
  Filled 2023-04-11: qty 50

## 2023-04-11 MED ORDER — PHENYLEPHRINE 80 MCG/ML (10ML) SYRINGE FOR IV PUSH (FOR BLOOD PRESSURE SUPPORT)
PREFILLED_SYRINGE | INTRAVENOUS | Status: AC
  Filled 2023-04-11: qty 10

## 2023-04-11 MED ORDER — ENOXAPARIN SODIUM 40 MG/0.4ML IJ SOSY
40.0000 mg | PREFILLED_SYRINGE | INTRAMUSCULAR | Status: DC
  Administered 2023-04-12: 40 mg via SUBCUTANEOUS
  Filled 2023-04-11: qty 0.4

## 2023-04-11 MED ORDER — LACTATED RINGERS IV SOLN: INTRAVENOUS | Status: DC

## 2023-04-11 MED ORDER — FENTANYL CITRATE (PF) 250 MCG/5ML IJ SOLN
INTRAMUSCULAR | Status: DC | PRN
  Administered 2023-04-11 (×2): 50 ug via INTRAVENOUS

## 2023-04-11 MED ORDER — DEXTROSE 50 % IV SOLN: 12.5000 g | INTRAVENOUS | Status: AC

## 2023-04-11 MED ORDER — PROPOFOL 10 MG/ML IV BOLUS
INTRAVENOUS | Status: AC
  Filled 2023-04-11: qty 20

## 2023-04-11 MED ORDER — VANCOMYCIN HCL 1250 MG/250ML IV SOLN
1250.0000 mg | Freq: Two times a day (BID) | INTRAVENOUS | Status: DC
  Administered 2023-04-11 – 2023-04-12 (×4): 1250 mg via INTRAVENOUS
  Filled 2023-04-11 (×5): qty 250

## 2023-04-11 MED ORDER — ORAL CARE MOUTH RINSE: 15.0000 mL | Freq: Once | OROMUCOSAL | Status: AC

## 2023-04-11 MED ORDER — PIPERACILLIN-TAZOBACTAM 3.375 G IVPB
3.3750 g | Freq: Three times a day (TID) | INTRAVENOUS | Status: DC
  Administered 2023-04-11 – 2023-04-12 (×3): 3.375 g via INTRAVENOUS
  Filled 2023-04-11 (×4): qty 50

## 2023-04-11 SURGICAL SUPPLY — 45 items
APL PRP STRL LF DISP 70% ISPRP (MISCELLANEOUS)
BAG COUNTER SPONGE SURGICOUNT (BAG) ×1 IMPLANT
BAG SPNG CNTER NS LX DISP (BAG) ×1
BLADE SURG 15 STRL LF DISP TIS (BLADE) IMPLANT
BLADE SURG 15 STRL SS (BLADE)
BNDG CMPR MD 5X2 ELC HKLP STRL (GAUZE/BANDAGES/DRESSINGS) ×1
BNDG ELASTIC 2 VLCR STRL LF (GAUZE/BANDAGES/DRESSINGS) IMPLANT
BNDG ELASTIC 4X5.8 VLCR STR LF (GAUZE/BANDAGES/DRESSINGS) IMPLANT
BNDG GAUZE DERMACEA FLUFF 4 (GAUZE/BANDAGES/DRESSINGS) ×1 IMPLANT
BNDG GZE DERMACEA 4 6PLY (GAUZE/BANDAGES/DRESSINGS) ×1
CHLORAPREP W/TINT 26 (MISCELLANEOUS) IMPLANT
COVER SURGICAL LIGHT HANDLE (MISCELLANEOUS) ×1 IMPLANT
CUFF TOURN SGL QUICK 18X4 (TOURNIQUET CUFF) ×1 IMPLANT
CUFF TOURN SGL QUICK 24 (TOURNIQUET CUFF)
CUFF TRNQT CYL 24X4X16.5-23 (TOURNIQUET CUFF) IMPLANT
DRSG XEROFORM 1X8 (GAUZE/BANDAGES/DRESSINGS) IMPLANT
ELECT REM PT RETURN 9FT ADLT (ELECTROSURGICAL)
ELECTRODE REM PT RTRN 9FT ADLT (ELECTROSURGICAL) IMPLANT
GAUZE PAD ABD 8X10 STRL (GAUZE/BANDAGES/DRESSINGS) ×1 IMPLANT
GAUZE SPONGE 4X4 12PLY STRL (GAUZE/BANDAGES/DRESSINGS) ×1 IMPLANT
GAUZE XEROFORM 1X8 LF (GAUZE/BANDAGES/DRESSINGS) ×1 IMPLANT
GLOVE BIO SURGEON STRL SZ8 (GLOVE) ×1 IMPLANT
GLOVE BIOGEL PI IND STRL 8 (GLOVE) ×1 IMPLANT
GOWN STRL REUS W/ TWL LRG LVL3 (GOWN DISPOSABLE) ×2 IMPLANT
GOWN STRL REUS W/TWL LRG LVL3 (GOWN DISPOSABLE) ×2
KIT BASIN OR (CUSTOM PROCEDURE TRAY) ×1 IMPLANT
KIT TURNOVER KIT B (KITS) ×1 IMPLANT
NDL PRECISIONGLIDE 27X1.5 (NEEDLE) ×1 IMPLANT
NEEDLE PRECISIONGLIDE 27X1.5 (NEEDLE) ×1 IMPLANT
NS IRRIG 1000ML POUR BTL (IV SOLUTION) ×1 IMPLANT
PACK ORTHO EXTREMITY (CUSTOM PROCEDURE TRAY) ×1 IMPLANT
PAD ARMBOARD 7.5X6 YLW CONV (MISCELLANEOUS) ×2 IMPLANT
PAD CAST 4YDX4 CTTN HI CHSV (CAST SUPPLIES) ×1 IMPLANT
PADDING CAST COTTON 4X4 STRL (CAST SUPPLIES) ×1
SOL PREP POV-IOD 4OZ 10% (MISCELLANEOUS) ×1 IMPLANT
STAPLER VISISTAT 35W (STAPLE) IMPLANT
SUT PROLENE 4 0 PS 2 18 (SUTURE) IMPLANT
SUT VIC AB 3-0 PS2 18 (SUTURE)
SUT VIC AB 3-0 PS2 18XBRD (SUTURE) IMPLANT
SUT VICRYL 4-0 PS2 18IN ABS (SUTURE) IMPLANT
SYR CONTROL 10ML LL (SYRINGE) ×1 IMPLANT
TOWEL GREEN STERILE (TOWEL DISPOSABLE) ×1 IMPLANT
TOWEL GREEN STERILE FF (TOWEL DISPOSABLE) ×1 IMPLANT
TUBE CONNECTING 12X1/4 (SUCTIONS) ×1 IMPLANT
YANKAUER SUCT BULB TIP NO VENT (SUCTIONS) IMPLANT

## 2023-04-11 NOTE — Op Note (Signed)
Full Operative Report  Date of Operation: 5:57 PM, 04/11/2023   Patient: Robert [name] - 67 y.o. male  Surgeon: Pilar Plate, DPM   Assistant: None  Diagnosis: Osteomyelitis of right 3rd toe  Procedure:  1. Amputation of 3rd toe at MPJ level, right foot    Anesthesia: Monitor Anesthesia Care  No responsible provider has been recorded for the case.  Anesthesiologist: Jairo Ben, MD CRNA: Noah Delaine, CRNA   Estimated Blood Loss: Minimal   Hemostasis: 1) Anatomical dissection, mechanical compression, electrocautery 2) Ankle tourniquet 250 mm Hg  Implants: * No implants in log *  Materials: prolene  Injectables: 1) Pre-operatively: 10 cc of 0.5% marcaine plain mixed with 2% lidocaine 2) Post-operatively: None  Specimens: Right 3rd toe for pathology - proximal margin inked. Right 3rd MPJ deep culture   Antibiotics: Abx given as scheduled from the floor  Drains: None  Complications: Patient tolerated the procedure well without complication.   Findings: as below  Indications for Procedure: Robert [name] presents to [place], [place] with a chief complaint of Right 3rd toe infection, concern for osteomyelitis on MRI. The patient has failed conservative treatments of various modalities. At this time the patient has elected to proceed with surgical correction. All alternatives, risks, and complications of the procedures were thoroughly explained to the patient. Patient exhibits appropriate understanding of all discussion points and informed consent was signed and obtained in the chart with no guarantees to surgical outcome given or implied.  Description of Procedure: Patient was brought to the operating room and placed on the operative table in the supine position. Patient was secured to the table with safety belt, a contralateral SCD was placed, and all bony prominences were well padded. A surgical timeout was performed and all members of the  operating room, the procedure, and the surgical site were identified. IV sedation anesthesia occurred. Local anesthetic as previously described was then injected about the operative field in a local infiltrative block.   A well padded pneumatic tourniquet was placed to the operative limb. The right lower extremity was then prepped and draped in the usual sterile manner. The pneumatic tourniquet was inflated. The following procedure then began.  Attention was directed to the 3rd digit on the right foot. A full-thickness incision encompassing the entire digit was made using a #15 blade. Dissection was carried down to bone. The toe was secured with a towel clamp, further dissected in its entirety, and disarticulated at the MPJ and passed to the back table as a gross specimen. This was then labled and sent to pathology, proximal margin was inked. The bone was noted to be soft and eroded, and consistent with osteomyelitis. All remaining necrotic and devitalized soft tissue structures were visualized and dissected away using sharp and dull dissection. Care was taken to protect all neurovascular structures throughout the dissection. All bleeders were cauterized as necessary. A deep tissue culture was obtained at this time from the 3rd MPJ. The area was then flushed with copious amounts of sterile saline. Then using the suture materials previously described, the site was closed in anatomic layers and the skin was well approximated under minimal tension.  The surgical site was then dressed with xeroform 4x4 kerlix and ace wrap. The tourniquet was deflated after 13 minutes with immediate vascular return noted to the operative foot. The patient tolerated both the procedure and anesthesia well with vital signs stable throughout. The patient was transferred from the OR to recovery under the  discretion of anesthesia.  Condition: Patient was taken to PACU in good condition and all vital signs stable and neurovascular  status intact to the operative limb.  Discharge: Surgical cure of infection with clean healthy amputation site. WBAT in post op shoe. 7 days PO abx doxy and augmentin on DC. Ok to resume anticoag.   [name], [name], DPM will follow the patient throughout the entire post-operative course and the patient is aware of all post-operative protocols in place. The patient will follow the protocol of rest, ice, and elevation. The patient will be WBAT in a post op shoe to the operative limb until further instructed. The dressing is to remain clean, dry, and intact.   Carlena Hurl, DPM

## 2023-04-11 NOTE — Progress Notes (Signed)
Pharmacy Antibiotic Note  Robert [name] is a 67 y.o. male admitted on 04/10/2023 with worsening infection of toe and concerns for osteomyelitis. Patient previously started on doxycycline for toe infection week prior.  Pharmacy has been consulted for zosyn and vancomycin dosing. MRI shows osteomyelitis of the distal phalanx of the 3rd toe.  WBC 3.4, sCr 0.91 (~baseline), afebrile, received 1 dose of vancomycin and zosyn in ED, blood cultures pending  Plan: Vancomycin 2g IV x1 Vancomycin 1250mg  IV every 12 hours (AUC 482, IBW, Vd 0.5) Zosyn 3.375gm IV every 8 hours Monitor renal function Follow up surgical plans, LOT, de-escalation, cultures, signs of clinical improvement  Height: 5\' 11"  (180.3 cm) Weight: (!) 143.8 kg (317 lb) IBW/kg (Calculated) : 75.3  Temp (24hrs), Avg:97.8 F (36.6 C), Min:97.6 F (36.4 C), Max:98 F (36.7 C)  Recent Labs  Lab 04/10/23 1125 04/10/23 1355  WBC 3.4*  --   CREATININE 0.91  --   LATICACIDVEN  --  1.4    Estimated Creatinine Clearance: 111.3 mL/min (by C-G formula based on SCr of 0.91 mg/dL).    No Known Allergies  Antimicrobials this admission: Vancomycin 9/25 >>  Zosyn 9/25 >>   Microbiology results: 9/25 BCx:  9/25 MRSA PCR: not detected  Thank you for allowing pharmacy to be a part of this patient's care.  Arabella Merles, PharmD. Clinical Pharmacist 04/11/2023 1:07 AM

## 2023-04-11 NOTE — Plan of Care (Signed)

## 2023-04-11 NOTE — Transfer of Care (Signed)
Immediate Anesthesia Transfer of Care Note  Patient: Robert [name]  Procedure(s) Performed: AMPUTATION TOE (Right: Toe)  Patient Location: PACU  Anesthesia Type:MAC  Level of Consciousness: awake, alert , and oriented  Airway & Oxygen Therapy: Patient Spontanous Breathing  Post-op Assessment: Report given to RN and Post -op Vital signs reviewed and stable  Post vital signs: Reviewed and stable  Last Vitals:  Vitals Value Taken Time  BP 93/66 04/11/23 1742  Temp 36.4 C 04/11/23 1742  Pulse 82 04/11/23 1745  Resp 11 04/11/23 1745  SpO2 97 % 04/11/23 1745  Vitals shown include unfiled device data.  Last Pain:  Vitals:   04/11/23 1742  TempSrc:   PainSc: 0-No pain      Patients Stated Pain Goal: 3 (04/10/23 1640)  Complications: No notable events documented.

## 2023-04-11 NOTE — Evaluation (Signed)
Physical Therapy Evaluation Patient Details Name: Robert [name] MRN: [id number] DOB: 1956-03-30 Today's Date: 04/11/2023  History of Present Illness  Pt is 67 yo male who presents on 04/10/23 with osteomyelitis of 3rd R toe. PMH: DM2, OSA, anxiety, depression, asthma, arthritis, R 2nd toe amp, multiple L toe amps, back sx  Clinical Impression  Pt admitted with above diagnosis. 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 Pt has cane and RW at home. Discussed his h/o back surgery and uneven gait with postop shoe and advised pt to use RW post op to decrease risk of back flaring up.  Pt mobilizing with supervision with cane today. Will check on pt after surgery to make sure mobility has not declined but beyond that no further needs. Pt currently with functional limitations due to the deficits listed below (see PT Problem List). Pt will benefit from acute skilled PT to increase their independence and safety with mobility to allow discharge.           If plan is discharge home, recommend the following: Assistance with cooking/housework;Assist for transportation   Can travel by private vehicle        Equipment Recommendations None recommended by PT  Recommendations for Other Services       Functional Status Assessment Patient has not had a recent decline in their functional status     Precautions / Restrictions Precautions Precautions: None Restrictions Weight Bearing Restrictions: No      Mobility  Bed Mobility Overal bed mobility: Independent                  Transfers Overall transfer level: Modified independent Equipment used: Straight cane                    Ambulation/Gait Ambulation/Gait assistance: Supervision Gait Distance (Feet): 200 Feet Assistive device: Straight cane Gait Pattern/deviations: Step-through pattern, Antalgic Gait velocity: WFL Gait velocity interpretation: >4.37 ft/sec, indicative of normal walking speed   General  Gait Details: pt ambulates with altered gait as he tries to keep pressure off affected toe. Discussed use of RW after surgery when he has boot due to his h/o back surgery and uneveness of hips with 1 foot in boot  Stairs            Wheelchair Mobility     Tilt Bed    Modified Rankin (Stroke Patients Only)       Balance Overall balance assessment: Modified Independent                                           Pertinent Vitals/Pain Pain Assessment Pain Assessment: Faces Faces Pain Scale: Hurts little more Pain Location: R toe Pain Descriptors / Indicators: Sore Pain Intervention(s): Limited activity within patient's tolerance, Monitored during session    Home Living Family/patient expects to be discharged to:: Private residence Living Arrangements: Spouse/significant other Available Help at Discharge: Family;Available PRN/intermittently Type of Home: House Home Access: Stairs to enter Entrance Stairs-Rails: Doctor, general practice of Steps: 4   Home Layout: One level Home Equipment: Agricultural consultant (2 wheels);Cane - single point;Crutches Additional Comments: wife is a 1st grade teacher, 4 yo son usually home while she is at work. 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    Prior Function Prior Level of Function : Independent/Modified Independent;Driving;Working/employed  Mobility Comments: independent ADLs Comments: independent     Extremity/Trunk Assessment   Upper Extremity Assessment Upper Extremity Assessment: Overall WFL for tasks assessed    Lower Extremity Assessment Lower Extremity Assessment: Overall WFL for tasks assessed;RLE deficits/detail RLE Deficits / Details: R 3rd toe edematous RLE Sensation: history of peripheral neuropathy RLE Coordination: WNL    Cervical / Trunk Assessment Cervical / Trunk Assessment: Normal  Communication   Communication Communication: No apparent difficulties Cueing  Techniques: Verbal cues  Cognition Arousal: Alert Behavior During Therapy: WFL for tasks assessed/performed Overall Cognitive Status: Within Functional Limits for tasks assessed                                          General Comments General comments (skin integrity, edema, etc.): VSS    Exercises General Exercises - Lower Extremity Ankle Circles/Pumps: PROM, Both, 20 reps, Seated   Assessment/Plan    PT Assessment Patient needs continued PT services  PT Problem List Decreased mobility;Pain       PT Treatment Interventions DME instruction;Gait training;Stair training;Functional mobility training;Therapeutic activities;Therapeutic exercise;Patient/family education    PT Goals (Current goals can be found in the Care Plan section)  Acute Rehab PT Goals Patient Stated Goal: return home PT Goal Formulation: With patient Time For Goal Achievement: 04/18/23 Potential to Achieve Goals: Good    Frequency Min 1X/week     Co-evaluation               AM-PAC PT "6 Clicks" Mobility  Outcome Measure Help needed turning from your back to your side while in a flat bed without using bedrails?: None Help needed moving from lying on your back to sitting on the side of a flat bed without using bedrails?: None Help needed moving to and from a bed to a chair (including a wheelchair)?: None Help needed standing up from a chair using your arms (e.g., wheelchair or bedside chair)?: None Help needed to walk in hospital room?: A Little Help needed climbing 3-5 steps with a railing? : A Little 6 Click Score: 22    End of Session   Activity Tolerance: Patient tolerated treatment well Patient left: in chair;with call bell/phone within reach Nurse Communication: Mobility status PT Visit Diagnosis: Unsteadiness on feet (R26.81)    Time: 9629-5284 PT Time Calculation (min) (ACUTE ONLY): 22 min   Charges:   PT Evaluation $PT Eval Low Complexity: 1 Low   PT General  Charges $$ ACUTE PT VISIT: 1 Visit         Lyanne Co, PT  Acute Rehab Services Secure chat preferred Office [phone]   Lawana Chambers Mariela Rex 04/11/2023, 2:19 PM

## 2023-04-11 NOTE — Anesthesia Postprocedure Evaluation (Signed)
Anesthesia Post Note  Patient: Robert [name]  Procedure(s) Performed: AMPUTATION TOE (Right: Toe)     Patient location during evaluation: PACU Anesthesia Type: MAC Level of consciousness: awake and alert, oriented and patient cooperative Pain management: pain level controlled Vital Signs Assessment: post-procedure vital signs reviewed and stable Respiratory status: spontaneous breathing, nonlabored ventilation and respiratory function stable Cardiovascular status: blood pressure returned to baseline and stable Postop Assessment: no apparent nausea or vomiting and adequate PO intake Anesthetic complications: no   No notable events documented.  Last Vitals:  Vitals:   04/11/23 1745 04/11/23 1800  BP: 95/65 112/72  Pulse: 82 81  Resp: 11 15  Temp:  36.4 C  SpO2: 97% 97%    Last Pain:  Vitals:   04/11/23 1742  TempSrc:   PainSc: 0-No pain    LLE Motor Response: Purposeful movement (04/11/23 1800) LLE Sensation: Full sensation (04/11/23 1800) RLE Motor Response: Purposeful movement (04/11/23 1800) RLE Sensation: Full sensation (04/11/23 1800)      Shakeem Stern,E. Rohaan Durnil

## 2023-04-11 NOTE — Progress Notes (Addendum)
Summary: [name] is a 67 y.o. with a PMH of HTN, T2DM complicated by diabetic foot infections, HLD, OSA on CPAP, and chronic back pain who was admitted on 9/25 for acute osteomyelitis of the right third toe.  Subjective:  No overnight events other than patient given oxy 5 mg 2x for pain. Patient resting comfortably in bed, playing his guitar. His wife is at bedside. Patient reports continued pain on R 3rd digit. Denies any fever, N/V, abdominal pain, CP, SOB, diarrhea, or any other acute pain. Has been able to void ok. Patient and wife updated on labs and plan for the day, both continue to express concerns regarding his frequent toe amputations.   Objective:  Vital signs in last 24 hours: Vitals:   04/10/23 1948 04/10/23 2250 04/11/23 0600 04/11/23 0804  BP: 139/62  115/68 110/66  Pulse: 70 64 65 68  Resp: 18 18 18 18   Temp: 97.7 F (36.5 C)  98.2 F (36.8 C) 98.3 F (36.8 C)  TempSrc: Oral  Oral   SpO2: 100% 100% 100% 99%  Weight:      Height:          Latest Ref Rng & Units 04/11/2023    3:30 AM 04/10/2023   11:25 AM 06/04/2022    7:14 AM  CBC  WBC 4.0 - 10.5 K/uL 4.4  3.4  3.8   Hemoglobin 13.0 - 17.0 g/dL 96.0  45.4  09.8   Hematocrit 39.0 - 52.0 % 35.5  36.1  39.7   Platelets 150 - 400 K/uL 158  171  145        Latest Ref Rng & Units 04/11/2023    3:30 AM 04/10/2023   11:25 AM 06/04/2022    7:14 AM  CMP  Glucose 70 - 99 mg/dL 119  147  829   BUN 8 - 23 mg/dL 17  16  21    Creatinine 0.61 - 1.24 mg/dL 5.62  1.30  8.65   Sodium 135 - 145 mmol/L 138  138  136   Potassium 3.5 - 5.1 mmol/L 4.3  4.5  4.1   Chloride 98 - 111 mmol/L 108  108  105   CO2 22 - 32 mmol/L 25  27  24    Calcium 8.9 - 10.3 mg/dL 8.4  8.9  9.0   Total Protein 6.5 - 8.1 g/dL  6.7  6.6   Total Bilirubin 0.3 - 1.2 mg/dL  0.6  0.9   Alkaline Phos 38 - 126 U/L  72  61   AST 15 - 41 U/L  20  19   ALT 0 - 44 U/L  19  21     ABIs pending   Physical Exam Constitutional: Patient is resting  in no acute distress, speaking in full sentences and answering and asking questions appropriately.  Pulmonary/Respiratory: Normal respiratory effort on room air.  Abdominal: Soft, non-tender, non-distended.  MSK: Third toe on R foot same as yesterday, still erythematous and edematous but limited to the toe. No drainage observed. Tender on touch. Betadine residue visible, no bandage on.  Able to move toes on both feed spontaneously. Bilateral pulses intact, feet warm and dry.  Skin: Warm and dry. Psych: Normal mood and affect.   Assessment/Plan:  Principal Problem:   Acute osteomyelitis of toe, right (HCC) Active Problems:   Morbid obesity (HCC)   OSA (obstructive sleep apnea)   Secondary hypertension  Patient is a 67 y.o. with a PMH of HTN, T2DM complicated  by diabetic foot infections and history of toe amputations who presents for a worsening right foot wound and admitted for acute osteomyelitis of the third toe.  Etiology likely related to diabetes, neuropathy, and poor wound healing. Ordered ABIs, will follow up results and any vascular surgery recommendations. Depending on ABI results, may need to consider CTA to better evaluate LE vasculature. Patient tolerating IV antibiotics. Pain in toe is improved with PRN oxy 5 mg.   Podiatry planning for third digit amputation today. Will follow up with post-op recommendations.    Plan:  - NPO - Holding VTE prophylaxis - Continue IV Vancomycin and IV Zosyn pending blood cultures - WBAT to right foot post op - PT eval/treat - Follow up ABIs - May need CTA with run off to better evaluate LE vasculature to determine if poor circulation contributing  T2DM  Chronic. Hgb A1C 5.4, previously 5.1 a month ago. Per patient he would like to have discussion with his PCP, Robert [name] at [facility], to question his current use of diabetes medications. Discussed the potential complications of diabetes, including poor circulation  and nerve damage that can lead to ulcers and infection, but patient and his spouse still questioning whether diabetes can explain his repeat need for amputations.  - Continue daily Farxiga   HTN  Chronic. Normal kidney function noted on admission, serum creatinine 0.98 today.  - Continue amlodipine 5 mg, irbesartan 300 mg - Monitor daily serum Cr   OSA Chronic. CPAP ordered.    Diet: NPO until after procedure DVT prophylaxis: sq heparin (held for procedure) Code status: Full Code  Philomena Doheny, MD, PGY-1 04/11/2023, 8:29 AM Pager: [phone] After 5pm on weekdays and 1pm on weekends: On Call pager [phone]

## 2023-04-11 NOTE — Progress Notes (Signed)
ABI has been completed.    Results can be found under chart review under CV PROC. 04/11/2023 12:58 PM Gen Clagg RVT, RDMS

## 2023-04-11 NOTE — Progress Notes (Signed)
PODIATRY PROGRESS NOTE Patient Name: Robert [name]  DOB 1955-10-13 DOA 04/10/2023  Hospital Day: 2  Assessment:  67 y.o. male with PMHx significant for  type 2 diabetes, OSA, anxiety, depression, asthma, arthritis  with right 3rd toe ulceration with acute osteomyelitis of the digit.    AF VSS  ESR / CRP: 7 / 0.6 MR R foot WO contrast: 1. Osteomyelitis of the distal phalanx of the 3rd toe with overlying soft tissue ulceration and swelling. 2. No evidence of soft tissue abscess. 3. Interval amputation of the 2nd toe. 4. Progressive arthropathic changes at the 1st metatarsophalangeal and Lisfranc joints.  Plan:  - NPOfor OR now for R 3rd toe amputation  - Continue IV abx broad spectrum pending further culture data. IF source control obtained in OR likely can be discharged with 10 days po doxy BID and Augmentin x 7 days  - Anticoagulation: Please hold for OR  - Wound care: Surgical dressing to remain clean dry and intact post op - WB status: WBAT to right foot post op - will need post op shoe following OR - Will continue to follow        Robert [name], DPM [facility]    Subjective:  Pt seen in pre op, discussed plan for OR today. He is in agreement.   Objective:   Vitals:   04/11/23 0804 04/11/23 1600  BP: 110/66 127/70  Pulse: 68 68  Resp: 18 20  Temp: 98.3 F (36.8 C) 98.3 F (36.8 C)  SpO2: 99% 98%       Latest Ref Rng & Units 04/11/2023    3:30 AM 04/10/2023   11:25 AM 06/04/2022    7:14 AM  CBC  WBC 4.0 - 10.5 K/uL 4.4  3.4  3.8   Hemoglobin 13.0 - 17.0 g/dL 16.1  09.6  04.5   Hematocrit 39.0 - 52.0 % 35.5  36.1  39.7   Platelets 150 - 400 K/uL 158  171  145        Latest Ref Rng & Units 04/11/2023    3:30 AM 04/10/2023   11:25 AM 06/04/2022    7:14 AM  BMP  Glucose 70 - 99 mg/dL 409  811  914   BUN 8 - 23 mg/dL 17  16  21    Creatinine 0.61 - 1.24 mg/dL 7.82  9.56  2.13   Sodium 135 - 145 mmol/L 138  138  136   Potassium 3.5 - 5.1  mmol/L 4.3  4.5  4.1   Chloride 98 - 111 mmol/L 108  108  105   CO2 22 - 32 mmol/L 25  27  24    Calcium 8.9 - 10.3 mg/dL 8.4  8.9  9.0     General: AAOx3, NAD  Lower Extremity Exam Vasc:     R - PT palpable, DP palpable. Cap refill < 3 sec to digits               L - PT palpable, DP palpable. Cap refill <3 sec to digits   Derm:    R - Ulcer distal tuft of right 3rd toe (pt has prior 2nd toe amputation. Erythema and edema noted of the toe. Drainage from the ulcer + probe to bone                     L - Normal temp/texture/turgor with no open lesion or clinical signs of infection   MSK:  R - Edema of 3rd toe, prior 2nd toe amputation               L -  No gross deformities. Compartments soft, non-tender, compressible   Neuro:R - Gross sensation absent, loss of protective sensation. Gross motor function intact                    L - Gross sensation absent, loss of protective sensation.  Gross motor function intact       Radiology:  Results reviewed. See assessment for pertinent imaging results

## 2023-04-11 NOTE — Anesthesia Preprocedure Evaluation (Addendum)
Anesthesia Evaluation  Patient identified by MRN, date of birth, ID band Patient awake    Reviewed: Allergy & Precautions, NPO status , Patient's Chart, lab work & pertinent test results  History of Anesthesia Complications Negative for: history of anesthetic complications  Airway Mallampati: II  TM Distance: >3 FB Neck ROM: Full    Dental  (+) Poor Dentition, Dental Advisory Given, Missing, Chipped   Pulmonary sleep apnea and Continuous Positive Airway Pressure Ventilation , former smoker   breath sounds clear to auscultation       Cardiovascular hypertension, Pt. on medications (-) angina + Peripheral Vascular Disease   Rhythm:Regular Rate:Normal     Neuro/Psych    Depression    Peripheral neuropathy    GI/Hepatic negative GI ROS, Neg liver ROS,,,  Endo/Other  diabetes (glu 101)  Mounjaro: last dose 6d ago BMI 44.2  Renal/GU negative Renal ROS     Musculoskeletal  (+) Arthritis ,    Abdominal   Peds  Hematology negative hematology ROS (+)   Anesthesia Other Findings   Reproductive/Obstetrics                             Anesthesia Physical Anesthesia Plan  ASA: 3  Anesthesia Plan: MAC   Post-op Pain Management: Tylenol PO (pre-op)*   Induction: Intravenous  PONV Risk Score and Plan: 1 and Ondansetron  Airway Management Planned: Natural Airway and Simple Face Mask  Additional Equipment: None  Intra-op Plan:   Post-operative Plan:   Informed Consent: I have reviewed the patients History and Physical, chart, labs and discussed the procedure including the risks, benefits and alternatives for the proposed anesthesia with the patient or authorized representative who has indicated his/her understanding and acceptance.     Dental advisory given  Plan Discussed with: CRNA and Surgeon  Anesthesia Plan Comments:         Anesthesia Quick Evaluation

## 2023-04-11 NOTE — Consult Note (Addendum)
Hospital Consult    Reason for Consult:  right 3rd toe osteomyelitis Requesting Physician:  Ninetta Lights MD MRN #:  [id number]  History of Present Illness: Robert [name] is a 67 y.o. male with a past medical history of DMII, OSA, and HTN who was admitted to the hospital yesterday for a worsening right third toe wound.  MRI on admission demonstrated osteomyelitis of the distal phalanx of the third toe.   The patient has a history of multiple previous diabetic foot infections, resulting in left 2nd through 4th toe amputations and a right second toe amputation.  We were consulted to discuss the patient's vascular status and determine whether he needs any intervention.  On exam today he is doing well and about to go to the OR for his right third toe amputation.  He states he is unsure why he keeps getting toe infections and requiring amputations.  He denies any prior vascular interventions.  He denies any history of claudication or rest pain.  He states he is trying to lose weight.  He has also been vigilant about keeping his A1c under control.  His most recent A1c was 5.4.  He does not take a daily aspirin or statin.  He is a former smoker and quit over 30 years ago.  Past Medical History:  Diagnosis Date   Allergic rhinitis due to pollen    Arthritis    Asthma    Back pain    Barrett esophagus    Depressive disorder    Diabetes mellitus type 2 in obese    Exposure to hepatitis B    Exposure to hepatitis C    Narcotic abuse (HCC)    pain medications   OSA (obstructive sleep apnea)    on CPAP     Past Surgical History:  Procedure Laterality Date   ABDOMINAL SURGERY     AMPUTATION Right 06/11/2018   Procedure: RIGHT 2ND RAY AMPUTATION;  Surgeon: Nadara Mustard, MD;  Location: [facility] OR;  Service: Orthopedics;  Laterality: Right;   AMPUTATION TOE Left 06/02/2022   Procedure: LEFT FOURTH PARTIAL TOE AMPUTATION;  Surgeon: Edwin Cap, DPM;  Location: [facility] ORS;  Service: Podiatry;   Laterality: Left;   BACK SURGERY     Rods and screws in lumbar area   COLONOSCOPY     ESOPHAGOGASTRODUODENOSCOPY  multiple   HERNIA REPAIR     6   TOTAL KNEE ARTHROPLASTY  2009   left    Allergies  Allergen Reactions   Onion Other (See Comments)    Stomach Irritation     Prior to Admission medications   Medication Sig Start Date End Date Taking? Authorizing Provider  albuterol (VENTOLIN HFA) 108 (90 Base) MCG/ACT inhaler INHALE 1-2 PUFFS BY MOUTH EVERY 6 HOURS AS NEEDED FOR WHEEZE OR SHORTNESS OF BREATH. 09/21/21  Yes Abonza, Maritza, PA-C  amLODipine-olmesartan (AZOR) 5-40 MG tablet Take 1 tablet by mouth daily. 11/22/21  Yes [provider]  APPLE CIDER VINEGAR PO Take 15 mLs by mouth 2 (two) times daily.    Yes [provider]  B Complex Vitamins (B COMPLEX 100 PO) Take 1 capsule by mouth daily.   Yes [provider]  CALCIUM-MAGNESIUM-ZINC PO Take 1 tablet by mouth every other day.   Yes [provider]  Cholecalciferol (VITAMIN D3) 5000 units TABS 5,000 IU OTC vitamin D3 daily. 05/08/16  Yes Opalski, Gavin Pound, DO  doxycycline (VIBRA-TABS) 100 MG tablet Take 1 tablet (100 mg total) by mouth 2 (two) times  daily for 10 days. 04/05/23 04/15/23 Yes Sikora, Lurena Joiner, DPM  FARXIGA 10 MG TABS tablet Take 10 mg by mouth daily. 11/03/21  Yes [provider]  GINSENG PO Take 1 tablet by mouth once a week.   Yes [provider]  meloxicam (MOBIC) 15 MG tablet TAKE 1 TABLET BY MOUTH EVERY DAY Patient taking differently: Take 15 mg by mouth daily as needed for pain. 02/13/21  Yes Abonza, Maritza, PA-C  NARCAN 4 MG/0.1ML LIQD nasal spray kit Place 1 spray into the nose daily as needed (overdose). 08/19/18  Yes [provider]  ondansetron (ZOFRAN) 4 MG tablet Take 1 tablet (4 mg total) by mouth every 8 (eight) hours as needed for nausea or vomiting. 12/26/21  Yes Louann Sjogren, DPM  polyethylene glycol (MIRALAX / GLYCOLAX) 17 g packet Take 17  g by mouth daily as needed. 06/04/22  Yes Shalhoub, Deno Lunger, MD  Potassium 99 MG TABS Take 99 mg by mouth daily.   Yes [provider]  rosuvastatin (CRESTOR) 5 MG tablet Take 5 mg by mouth every evening. 10/27/21  Yes [provider]  tirzepatide Greggory Keen) 15 MG/0.5ML Pen Inject 15 mg into the skin once a week.   Yes [provider]  TURMERIC CURCUMIN PO Take 1 tablet by mouth every other day.   Yes [provider]  valACYclovir (VALTREX) 1000 MG tablet TAKE 1 TABLET BY MOUTH DAILY AS NEEDED (FOR OUTBREAKS ONLY). 03/06/21  Yes Abonza, Maritza, PA-C  vitamin B-12 1000 MCG tablet Take 1 tablet (1,000 mcg total) by mouth daily. 04/10/18  Yes Sheikh, Omair Latif, DO  vitamin C (ASCORBIC ACID) 500 MG tablet Take 500 mg by mouth daily.   Yes [provider]  VITAMIN E PO Take 1 tablet by mouth daily.   Yes [provider]  blood glucose meter kit and supplies Dispense based on patient and insurance preference. Use to check glucose fasting in the AM and then after largest meal of the day.. (FOR ICD-10 E10.9, E11.9). 12/04/17   Opalski, Gavin Pound, DO  Blood Glucose Monitoring Suppl (ONE TOUCH ULTRA MINI) w/Device KIT Use to test blood sugar, test in the morning fasting and test after largest meal of the day. 05/28/17   Opalski, Deborah, DO  glucose blood (ONE TOUCH ULTRA TEST) test strip Use to test blood sugar, test in the morning fasting and test after largest meal of the day. 05/28/17   Thomasene Lot, DO  ONETOUCH DELICA LANCETS 33G MISC Use for testing blood sugar, test once in the morning fasting and test after largest meal of the day. 05/28/17   Opalski, Deborah, DO  OZEMPIC, 0.25 OR 0.5 MG/DOSE, 2 MG/1.5ML SOPN INJECT 0.25MG  INTO SKIN ONCE WEEKLY FOR 4 WEEKS, THEN INJECT 0.5MG  INTO SKIN ONCE WEEKLY. Patient not taking: Reported on 04/11/2023 02/20/21   Mayer Masker, PA-C    Social History   Socioeconomic History   Marital status: Married     Spouse name: Not on file   Number of children: 2   Years of education: Not on file   Highest education level: Not on file  Occupational History   Occupation: Musician, Disabled  Tobacco Use   Smoking status: Former    Current packs/day: 0.00    Average packs/day: 0.8 packs/day for 15.0 years (12.0 ttl pk-yrs)    Types: Cigarettes    Start date: 07/16/1989    Quit date: 07/16/2004    Years since quitting: 18.7   Smokeless tobacco: Never  Vaping Use  Vaping status: Never Used  Substance and Sexual Activity   Alcohol use: No   Drug use: No    Comment: Prior abuse of narcotics   Sexual activity: Never  Other Topics Concern   Not on file  Social History Narrative   Married, 2 daughters born 2003 and 2004. 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 Long history of living in working in [place].   6 caffeinated beverages daily.   01/21/2017   Social Determinants of Health   Financial Resource Strain: Not on file  Food Insecurity: No Food Insecurity (04/10/2023)   Hunger Vital Sign    Worried About Running Out of Food in the Last Year: Never true    Ran Out of Food in the Last Year: Never true  Transportation Needs: No Transportation Needs (04/10/2023)   PRAPARE - Administrator, Civil Service (Medical): No    Lack of Transportation (Non-Medical): No  Physical Activity: Not on file  Stress: Not on file  Social Connections: Unknown (04/16/2022)   [facility] from [facility]   Social Network    Social Network: Not on file  Intimate Partner Violence: Not At Risk (04/10/2023)   Humiliation, Afraid, Rape, and Kick questionnaire    Fear of Current or Ex-Partner: No    Emotionally Abused: No    Physically Abused: No    Sexually Abused: No     Family History  Problem Relation Age of Onset   Allergies Mother    Early death Father    Thyroid disease Sister    Colon cancer Neg Hx    Stomach cancer Neg Hx     ROS: [x]  Positive   [  ] Negative   [ ]  All sytems reviewed and are negative  Cardiac: []  chest pain/pressure []  hx MI []  SOB   Vascular: []  pain in legs while walking []  pain in legs at rest []  pain in legs at night [x]  non-healing ulcers []  hx of DVT []  swelling in legs  Pulmonary: []  asthma/wheezing []  home O2  Neurologic: []  hx of CVA []  mini stroke   Hematologic: []  hx of cancer  Endocrine:   [x]  diabetes []  thyroid disease  GI []  GERD  GU: []  CKD/renal failure []  HD--[]  M/W/F or []  T/T/S  Psychiatric: []  anxiety []  depression  Musculoskeletal: []  arthritis []  joint pain  Integumentary: []  rashes []  ulcers  Constitutional: []  fever  []  chills  Physical Examination  Vitals:   04/11/23 0804 04/11/23 1600  BP: 110/66 127/70  Pulse: 68 68  Resp: 18 20  Temp: 98.3 F (36.8 C) 98.3 F (36.8 C)  SpO2: 99% 98%   Body mass index is 44.21 kg/m.  General:  WDWN in NAD Gait: Not observed HENT: WNL, normocephalic Pulmonary: normal non-labored breathing Cardiac: regular Abdomen:  soft, NT; aortic pulse is non palpable Skin: without rashes Vascular Exam/Pulses: 2+ femoral and DP pulses bilaterally Extremities: healed amputations of L 2-4th toes. Healed amputation of R 2nd toe. Osteo of R 3rd toe  CBC    Component Value Date/Time   WBC 4.4 04/11/2023 0330   RBC 3.90 (L) 04/11/2023 0330   HGB 11.7 (L) 04/11/2023 0330   HGB 13.2 03/28/2021 0841   HCT 35.5 (L) 04/11/2023 0330   HCT 38.1 03/28/2021 0841   PLT 158 04/11/2023 0330   PLT 180 03/28/2021 0841   MCV 91.0 04/11/2023 0330   MCV 88 03/28/2021 0841   MCH 30.0 04/11/2023  0330   MCHC 33.0 04/11/2023 0330   RDW 13.2 04/11/2023 0330   RDW 12.3 03/28/2021 0841   LYMPHSABS 1.0 04/10/2023 1125   LYMPHSABS 1.2 03/28/2021 0841   MONOABS 0.4 04/10/2023 1125   EOSABS 0.1 04/10/2023 1125   EOSABS 0.2 03/28/2021 0841   BASOSABS 0.0 04/10/2023 1125   BASOSABS 0.0 03/28/2021 0841    BMET    Component Value  Date/Time   NA 138 04/11/2023 0330   NA 138 03/28/2021 0841   K 4.3 04/11/2023 0330   CL 108 04/11/2023 0330   CO2 25 04/11/2023 0330   GLUCOSE 119 (H) 04/11/2023 0330   BUN 17 04/11/2023 0330   BUN 12 03/28/2021 0841   CREATININE 0.98 04/11/2023 0330   CREATININE 0.81 04/25/2016 0846   CALCIUM 8.4 (L) 04/11/2023 0330   GFRNONAA >60 04/11/2023 0330   GFRNONAA >89 04/25/2016 0846   GFRAA 94 08/31/2020 0941   GFRAA >89 04/25/2016 0846    COAGS: Lab Results  Component Value Date   INR 1.1 06/02/2022   INR 1.0 12/12/2007     Non-Invasive Vascular Imaging:   ABI (04/11/2023) +---------+------------------+-----+---------+--------+  Right   Rt Pressure (mmHg)IndexWaveform Comment   +---------+------------------+-----+---------+--------+  Brachial 122                    triphasic          +---------+------------------+-----+---------+--------+  PTA     137               1.12 triphasic          +---------+------------------+-----+---------+--------+  DP      136               1.11 triphasic          +---------+------------------+-----+---------+--------+  Great Toe92                0.75 Normal             +---------+------------------+-----+---------+--------+   +---------+------------------+-----+---------+-------+  Left    Lt Pressure (mmHg)IndexWaveform Comment  +---------+------------------+-----+---------+-------+  Brachial 112                    triphasic         +---------+------------------+-----+---------+-------+  PTA     139               1.14 triphasic         +---------+------------------+-----+---------+-------+  DP      129               1.06 triphasic         +---------+------------------+-----+---------+-------+  Great Toe154               1.26 Normal            +---------+------------------+-----+---------+-------+   +-------+-----------+-----------+------------+------------+  ABI/TBIToday's  ABIToday's TBIPrevious ABIPrevious TBI  +-------+-----------+-----------+------------+------------+  Right 1.12       0.75       1.26        1.23          +-------+-----------+-----------+------------+------------+  Left  1.14       1.26       1.31        1.05          +-------+-----------+-----------+------------+------------+     ASSESSMENT/PLAN: This is a 67 y.o. male with right third toe osteomyelitis   -The patient was admitted to the hospital yesterday with a right third toe osteomyelitis.  Podiatry plans  on performing right third toe amputation today. -The patient has a history of multiple previous diabetic foot infections, resulting in left 2nd through 4th toe amputations and right second toe amputation.  The patient and his wife wanted some peace of mind as to why he continues to get foot infections requiring amputation -I discussed with the patient how his diabetes can contribute to small vessel disease ultimately making wounds of the feet hard to heal.  Uncontrolled blood sugars will cause damage and calcification to his arteries.  He also is not on a regular statin and does not know his most recent cholesterol levels.  Plaque buildup over time, especially in the setting of diabetes, will also damage his blood vessels. -He has no prior vascular interventions.  He has no other symptoms of PAD including claudication or rest pain.  He has healed all of his previous toe amputations without issue.  His ABIs demonstrate triphasic flow with a toe pressure of 92 on the right and 154 on the left.  This is more than adequate for his chances of healing.  I do not believe he needs any intervention. He also has easily palpable pedal pulses -I educated the patient that the following things can help reduce his risk for further wound development and risk of more proximal amputations.  Ideally he should take a baby aspirin and statin daily.  He currently takes neither.  He should also  continue to protect his feet and monitor the status of them, given that he also has severe diabetic neuropathy. -He can proceed with amputation today.  He should have adequate blood flow to heal this   Robert Dubonnet, PA-C [facility] [phone]   VASCULAR STAFF ADDENDUM: I have independently interviewed and examined the patient. I agree with the above.   Robert Brunt. Lenell Antu, MD [facility] Phone Number: [phone] 04/11/2023 5:06 PM

## 2023-04-11 NOTE — Progress Notes (Signed)
CPAP within reach of patient.  Patient will place on self when ready.  RT available if needed.

## 2023-04-12 ENCOUNTER — Other Ambulatory Visit (HOSPITAL_COMMUNITY): Payer: Self-pay

## 2023-04-12 ENCOUNTER — Encounter (HOSPITAL_COMMUNITY): Payer: Self-pay | Admitting: Podiatry

## 2023-04-12 DIAGNOSIS — L089 Local infection of the skin and subcutaneous tissue, unspecified: Secondary | ICD-10-CM | POA: Diagnosis not present

## 2023-04-12 DIAGNOSIS — M86171 Other acute osteomyelitis, right ankle and foot: Secondary | ICD-10-CM | POA: Diagnosis not present

## 2023-04-12 DIAGNOSIS — E11628 Type 2 diabetes mellitus with other skin complications: Secondary | ICD-10-CM | POA: Diagnosis not present

## 2023-04-12 LAB — BASIC METABOLIC PANEL
Anion gap: 10 (ref 5–15)
BUN: 18 mg/dL (ref 8–23)
CO2: 22 mmol/L (ref 22–32)
Calcium: 9 mg/dL (ref 8.9–10.3)
Chloride: 104 mmol/L (ref 98–111)
Creatinine, Ser: 0.85 mg/dL (ref 0.61–1.24)
GFR, Estimated: >60 mL/min (ref >60)
Glucose, Bld: 140 mg/dL — ABNORMAL HIGH (ref 70–99)
Potassium: 4 mmol/L (ref 3.5–5.1)
Sodium: 136 mmol/L (ref 135–145)

## 2023-04-12 LAB — CBC
HCT: 38 % — ABNORMAL LOW (ref 39.0–52.0)
Hemoglobin: 13.2 g/dL (ref 13.0–17.0)
MCH: 31.1 pg (ref 26.0–34.0)
MCHC: 34.7 g/dL (ref 30.0–36.0)
MCV: 89.6 fL (ref 80.0–100.0)
Platelets: 204 10*3/uL (ref 150–400)
RBC: 4.24 MIL/uL (ref 4.22–5.81)
RDW: 13.1 % (ref 11.5–15.5)
WBC: 7.4 10*3/uL (ref 4.0–10.5)
nRBC: 0 % (ref 0.0–0.2)

## 2023-04-12 LAB — GLUCOSE, CAPILLARY
Glucose-Capillary: 124 mg/dL — ABNORMAL HIGH (ref 70–99)
Glucose-Capillary: 133 mg/dL — ABNORMAL HIGH (ref 70–99)
Glucose-Capillary: 161 mg/dL — ABNORMAL HIGH (ref 70–99)

## 2023-04-12 MED ORDER — DOXYCYCLINE HYCLATE 100 MG PO TABS
100.0000 mg | ORAL_TABLET | Freq: Two times a day (BID) | ORAL | 0 refills | Status: DC
  Filled 2023-04-12: qty 14, 7d supply, fill #0

## 2023-04-12 MED ORDER — DOXYCYCLINE HYCLATE 100 MG PO TABS: 100.0000 mg | ORAL_TABLET | Freq: Two times a day (BID) | ORAL | Status: DC

## 2023-04-12 MED ORDER — OXYCODONE HCL 5 MG PO TABS
5.0000 mg | ORAL_TABLET | Freq: Three times a day (TID) | ORAL | 0 refills | Status: AC | PRN
  Filled 2023-04-12: qty 9, 3d supply, fill #0

## 2023-04-12 MED ORDER — ASPIRIN 81 MG PO TBEC
81.0000 mg | DELAYED_RELEASE_TABLET | Freq: Every day | ORAL | 0 refills | Status: DC
  Filled 2023-04-12: qty 120, 120d supply, fill #0

## 2023-04-12 MED ORDER — ROSUVASTATIN CALCIUM 5 MG PO TABS
5.0000 mg | ORAL_TABLET | Freq: Every day | ORAL | 3 refills | Status: AC
  Filled 2023-04-12: qty 30, 30d supply, fill #0

## 2023-04-12 MED ORDER — AMOXICILLIN-POT CLAVULANATE 875-125 MG PO TABS
1.0000 | ORAL_TABLET | Freq: Two times a day (BID) | ORAL | 0 refills | Status: DC
  Filled 2023-04-12: qty 14, 7d supply, fill #0

## 2023-04-12 MED ORDER — AMOXICILLIN-POT CLAVULANATE 875-125 MG PO TABS: 1.0000 | ORAL_TABLET | Freq: Two times a day (BID) | ORAL | Status: DC

## 2023-04-12 NOTE — Progress Notes (Incomplete)
Discussed with the resident and agree with the resident's findings and plan of care as documented in the resident's note  Appreciate podiatry f/u Will send home with doxy per their request.  Appreciate vascular eval.

## 2023-04-12 NOTE — Progress Notes (Signed)
  Subjective:  Patient ID: Robert [name], male    DOB: [birth year],  MRN: [id number]  Chief Complaint  Patient presents with   Foot Pain    DOS: 04/12/23 Procedure: Amputation of 3rd toe, right foot at MPJ level   AF VSS  67 y.o. male seen for post op check. Denies pain. Aware of intra op findings and plan for follow up. Has family member bringing post op shoe from home today.   Review of Systems: Negative except as noted in the HPI. Denies N/V/F/Ch.   Objective:   Vitals:   04/11/23 2353 04/12/23 0445  BP: (!) 104/57 (!) 100/55  Pulse: 70 63  Resp: 20 20  Temp: 98.2 F (36.8 C) (!) 97.5 F (36.4 C)  SpO2: 92% 95%   Body mass index is 44.21 kg/m. Constitutional Well developed. Well nourished.  Vascular Foot warm and well perfused. Capillary refill normal to all digits.  Calf is soft and supple, no posterior calf or knee pain, negative Homans' sign  Neurologic Normal speech. Oriented to person, place, and time. Epicritic sensation to light touch grossly present bilaterally.  Dermatologic Dressing to remain c/d/i  Orthopedic: No tenderness to palpation noted about the surgical site.   Multiple view plain film radiographs: S/p r 3rd toe amputation at MPJ level Assessment:   1. Diabetic foot infection (HCC)   2. Osteomyelitis of right foot, unspecified type [facility])    Plan:  Patient was evaluated and treated and all questions answered.  S/p foot surgery Right 3rd toe amputation at MPJ level.  - Expect surgical cure of infection with resection of all necrotic tissue -No evidence of infection at the 3rd met head or in the MPJ.  -XR: post op changes as expected -WB Status: WBAT in Post op shoe -Sutures and dressing remain intact until follow up next week Thursday. -Medications: Recommend 7 days PO doxycyline and augmentin post op on DC.  - Pt stable for DC later today if cleared by primary team. Discussed with patient and he is in agreement -Will sign off at this time,  please message with questions or concerns        Corinna Gab, DPM [facility]

## 2023-04-12 NOTE — Progress Notes (Signed)
AVS printed and reviewed with pt, TOC meds delivered. Pt has equipment at home that he needs. PT has signed off on pt.  Pt has cane, wife on the way for discharge.

## 2023-04-12 NOTE — Progress Notes (Signed)
Physical Therapy Treatment Patient Details Name: Robert [name] MRN: [id number] DOB: 1955-09-03 Today's Date: 04/12/2023   History of Present Illness Pt is 67 yo male who presents on 04/10/23 with osteomyelitis of 3rd R toe. PMH: DM2, OSA, anxiety, depression, asthma, arthritis, R 2nd toe amp, multiple L toe amps, back sx    PT Comments  Pt received in supine, pleasantly agreeable to therapy session and with excellent participation and tolerance for gait and stair negotiation with RW support. Pt needing Supervision for cues for safety with transfers but otherwise modI for community distance gait trial and full flight of stairs, no vcs needed other than with transfers. Reinforced use of RW and post-op shoe (spouse to bring post-op shoe to his room for DC). Pt has met all goals after transfer training today, anticipate pt safe to DC home from a functional mobility standpoint once medically cleared, do not anticipate need for post-acute services.     If plan is discharge home, recommend the following: Assistance with cooking/housework;Assist for transportation   Can travel by private vehicle        Equipment Recommendations  None recommended by PT    Recommendations for Other Services       Precautions / Restrictions Precautions Precautions: None Precaution Comments: RLE dressed and ace wrapped; no post-op shoe in room yet, spouse plans to bring it Restrictions Weight Bearing Restrictions: Yes RLE Weight Bearing: Weight bearing as tolerated     Mobility  Bed Mobility Overal bed mobility: Independent                  Transfers Overall transfer level: Needs assistance Equipment used: Rolling walker (2 wheels) Transfers: Sit to/from Stand Sit to Stand: Supervision           General transfer comment: safety cues for not abandoning RW prior to sitting, where to place hands, no physical assist needed    Ambulation/Gait Ambulation/Gait assistance: Modified independent  (Device/Increase time) Gait Distance (Feet): 450 Feet Assistive device: Rolling walker (2 wheels) Gait Pattern/deviations: Step-through pattern, Antalgic Gait velocity: WFL     General Gait Details: good RW use   Stairs Stairs: Yes Stairs assistance: Modified independent (Device/Increase time) Stair Management: One rail Right, Step to pattern, Forwards Number of Stairs: 10 General stair comments: good recall of sequencing, no cues needed   Wheelchair Mobility     Tilt Bed    Modified Rankin (Stroke Patients Only)       Balance Overall balance assessment: Modified Independent                                          Cognition Arousal: Alert Behavior During Therapy: WFL for tasks assessed/performed Overall Cognitive Status: Within Functional Limits for tasks assessed                                 General Comments: mildly decreased safety wtih RW with transfers otherwise [facility] good recall of RW use/step sequencing        Exercises      General Comments General comments (skin integrity, edema, etc.): HR/O2 WFL on RA      Pertinent Vitals/Pain Pain Assessment Pain Assessment: Faces Faces Pain Scale: Hurts a little bit Pain Location: surgical site RLE Pain Intervention(s): Monitored during session    Home Living  Prior Function            PT Goals (current goals can now be found in the care plan section) Acute Rehab PT Goals Patient Stated Goal: return home PT Goal Formulation: With patient Time For Goal Achievement: 04/18/23 Progress towards PT goals: Progressing toward goals    Frequency    Min 1X/week      PT Plan      Co-evaluation              AM-PAC PT "6 Clicks" Mobility   Outcome Measure  Help needed turning from your back to your side while in a flat bed without using bedrails?: None Help needed moving from lying on your back to sitting on the side of a flat  bed without using bedrails?: None Help needed moving to and from a bed to a chair (including a wheelchair)?: None Help needed standing up from a chair using your arms (e.g., wheelchair or bedside chair)?: A Little Help needed to walk in hospital room?: None Help needed climbing 3-5 steps with a railing? : None 6 Click Score: 23    End of Session Equipment Utilized During Treatment: Gait belt Activity Tolerance: Patient tolerated treatment well Patient left: with call bell/phone within reach;in bed;Other (comment) (RLE elevated) Nurse Communication: Mobility status PT Visit Diagnosis: Unsteadiness on feet (R26.81)     Time: 1610-9604 PT Time Calculation (min) (ACUTE ONLY): 19 min  Charges:    $Gait Training: 8-22 mins PT General Charges $$ ACUTE PT VISIT: 1 Visit                     Opel Lejeune P., PTA Acute Rehabilitation Services Secure Chat Preferred 9a-5:30pm Office: [phone]    Dorathy Kinsman [facility] 04/12/2023, 2:16 PM

## 2023-04-12 NOTE — Discharge Instructions (Signed)
PATIENT INSTRUCTIONS:   - You were seen for osteomyelitis of the third toe on the right foot. This was treated with IV antibiotics and amputation by podiatry. Per their recommendations, you will follow up closely with them this upcoming week, and will be started on two oral antibiotics to be taken for a total of 7 days (9/28-10/04), starting tomorrow. The antibiotics are doxycyline (VIBRA-TABS) 100 mg one tablet every 12 hours and amoxicillin-clavulanate (Augmentin) one tablet every 12 hours.   - You were also seen by the vascular surgery team while you were with [place]. They reviewed potential causes for your recent toe infections and multiple toe amputations, including your history of diabetes, regardless of how well it has been controlled. As you discussed, diabetes can cause microvascular damage leading to decreased sensation, which makes it very difficult to tell when there is an abrasion on your feet that can be susceptible to infection. This is why it is VERY important to do frequent foot checks at home and annual diabetic foot exams. Additionally, they recommend re-starting a statin, which helps with cholesterol, called rosuvastatin/Crestor 5 mg a day, as well as baby aspirin 81 mg, which will help with circulation.   - Please set up a hospitalization follow-up appointment with your primary care provider, Lance Bosch, to review the above changes and to follow up on pain management. We have given you a few days of pain medication, but please defer to her if pain worsens or continues beyond that.   - Please call 911 if you have any emergencies, including numbness/pain in back of calves or surgical site, suspect there is an infection or bleeding from the site, or any other sudden change in your health such as chest pain, shortness of breath, or new onset nausea/vomiting/diarrhea.   - We are glad that you are feeling better, it was a pleasure taking care of you! Dr. Justin Mend and the Internal Medicine  Team at [facility]

## 2023-04-12 NOTE — Discharge Summary (Signed)
Name: Robert [name] MRN: [id number] DOB: 1955/11/05 67 y.o. PCP: Practice, [facility]  Date of Admission: 04/10/2023 11:02 AM Date of Discharge: 04/12/2023 6:24 PM Attending Physician: Dr.  Ninetta Lights  Discharge Diagnosis: Principal Problem:   Acute osteomyelitis of toe, right (HCC) Active Problems:   Morbid obesity (HCC)   OSA (obstructive sleep apnea)   Secondary hypertension   Type 2 diabetes mellitus with diabetic peripheral angiopathy without gangrene, without long-term current use of insulin (HCC)    Discharge Medications: Allergies as of 04/12/2023       Reactions   Onion Other (See Comments)   Stomach Irritation         Medication List     STOP taking these medications    meloxicam 15 MG tablet Commonly known as: MOBIC   Ozempic (0.25 or 0.5 MG/DOSE) 2 MG/1.5ML Sopn Generic drug: Semaglutide(0.25 or 0.5MG /DOS)       TAKE these medications    albuterol 108 (90 Base) MCG/ACT inhaler Commonly known as: VENTOLIN HFA INHALE 1-2 PUFFS BY MOUTH EVERY 6 HOURS AS NEEDED FOR WHEEZE OR SHORTNESS OF BREATH.   amLODipine-olmesartan 5-40 MG tablet Commonly known as: AZOR Take 1 tablet by mouth daily.   amoxicillin-clavulanate 875-125 MG tablet Commonly known as: AUGMENTIN Take 1 tablet by mouth every 12 (twelve) hours. Start taking on: April 13, 2023   APPLE CIDER VINEGAR PO Take 15 mLs by mouth 2 (two) times daily.   ascorbic acid 500 MG tablet Commonly known as: VITAMIN C Take 500 mg by mouth daily.   aspirin EC 81 MG tablet Take 1 tablet (81 mg total) by mouth daily. Swallow whole.   B COMPLEX 100 PO Take 1 capsule by mouth daily.   blood glucose meter kit and supplies Dispense based on patient and insurance preference. Use to check glucose fasting in the AM and then after largest meal of the day.. (FOR ICD-10 E10.9, E11.9).   CALCIUM-MAGNESIUM-ZINC PO Take 1 tablet by mouth every other day.   cyanocobalamin 1000 MCG tablet Take  1 tablet (1,000 mcg total) by mouth daily.   doxycycline 100 MG tablet Commonly known as: VIBRA-TABS Take 1 tablet (100 mg total) by mouth every 12 (twelve) hours. Start taking on: April 13, 2023 What changed: when to take this   Farxiga 10 MG Tabs tablet Generic drug: dapagliflozin propanediol Take 10 mg by mouth daily.   GINSENG PO Take 1 tablet by mouth once a week.   glucose blood test strip Commonly known as: ONE TOUCH ULTRA TEST Use to test blood sugar, test in the morning fasting and test after largest meal of the day.   Mounjaro 15 MG/0.5ML Pen Generic drug: tirzepatide Inject 15 mg into the skin once a week.   Narcan 4 MG/0.1ML Liqd nasal spray kit Generic drug: naloxone Place 1 spray into the nose daily as needed (overdose).   ondansetron 4 MG tablet Commonly known as: Zofran Take 1 tablet (4 mg total) by mouth every 8 (eight) hours as needed for nausea or vomiting.   ONE TOUCH ULTRA MINI w/Device Kit Use to test blood sugar, test in the morning fasting and test after largest meal of the day.   OneTouch Delica Lancets 33G Misc Use for testing blood sugar, test once in the morning fasting and test after largest meal of the day.   oxyCODONE 5 MG immediate release tablet Commonly known as: Oxy IR/ROXICODONE Take 1 tablet (5 mg total) by mouth every 8 (eight) hours as  needed for up to 3 days for moderate pain or severe pain.   polyethylene glycol 17 g packet Commonly known as: MIRALAX / GLYCOLAX Take 17 g by mouth daily as needed.   Potassium 99 MG Tabs Take 99 mg by mouth daily.   rosuvastatin 5 MG tablet Commonly known as: CRESTOR Take 1 tablet (5 mg total) by mouth daily. Start taking on: April 13, 2023 What changed: when to take this   TURMERIC CURCUMIN PO Take 1 tablet by mouth every other day.   valACYclovir 1000 MG tablet Commonly known as: VALTREX TAKE 1 TABLET BY MOUTH DAILY AS NEEDED (FOR OUTBREAKS ONLY).   Vitamin D3 125 MCG (5000  UT) Tabs 5,000 IU OTC vitamin D3 daily.   VITAMIN E PO Take 1 tablet by mouth daily.               Discharge Care Instructions  (From admission, onward)           Start     Ordered   04/12/23 0000  Discharge wound care:       Comments: WBAT in Post op shoe. Sutures and dressing remain intact until follow up next week Thursday.   04/12/23 1256            Disposition and follow-up:   Robert [name] was discharged from [facility] in Stable condition.  At the hospital follow up visit please address:  1.  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 Carlena Hurl, DPM, in the [facility] on October 3rd. Sutures and dressing to remain intact until that visit. Patient is to wear post op shoe and WBAT. Please follow up that patient followed up with Dr. Annamary Rummage and completed his 7 day course of antibiotics which includes doxycycline BID and augmentin BID.   Pain management - Patient denied pain day after surgery. Due to history of chronic pain provided a short course of oxycodone 5 mg q8h for 3 days should he have pain once he leaves the hospital. Please follow up with patient to ensure there is no worsening in pain.   Vascular surgery recommendations - Patient and his family expressed concern over repeat toe infections and amputations. Received education on diabetes and its impact on microvascular health. Vascular surgery (Dr. Heath Lark, [facility]) was also consulted to help reassure patient regarding other causes of limb infection and ischemia including peripheral artery disease. The vascular surgery team made recommendations to take daily statin and aspirin. Please follow up with patient to determine if he is taking these medications.   T2DM foot checks/exams - Discussed the importance of performing regular foot checks and annual diabetic foot exams with patient,  especially since patient and family are concerned about recurrence. Please review with patient and follow up if he or his spouse have been performing foot checks regularly.   Medication adherence - Patient and spouse expressed some concern over taking medications for health conditions that they don't think are active, including T2DM and HTN. Please discuss with patient so that he understands the importance of taking medications that are indicated in order to keep his chronic health conditions well controlled.   2.  Labs / imaging needed at time of follow-up: none unless patient with new signs or symptoms of infection   3.  Pending labs/ test needing follow-up: Wound culture of R toe from 9/26; surgical pathology  sample 9/26 processing; blood cultures from 9/25 no growth x 2 days  4.  Medication Changes  STOPPED  - Meloxicam (patient reported not taking)  - Ozempic (patient reported not taking)   ADDED  - Doxycycline 100 mg tablet BID x 7 days (9/28-10/04)  - Augmentin 875-125 mg tablet BID x 7 days (9/28-10/04)  - Aspirin 81 mg daily   - Rosuvastatin 5 mg   - Oxycodone 5 mg q8h as needed for pain x 3 days   MODIFIED  - n/a  Follow-up Appointments:  - Podiatry follow-up October 3rd with Dr. Annamary Rummage at the [facility] at [facility], [address] [place], [place] [address] Get Driving Directions Main: [phone]. If patient has not heard from Dr. Higinio Plan office to confirm time, please have him or his spouse call his office number to set it up.   Hospital Course by problem list:   Patient is a 67 y.o. with a PMH of HTN, T2DM complicated by diabetic foot infections, HLD, OSA on CPAP, and chronic back pain who presented to the [facility] ED on 9/25 for a worsening right foot wound. He was admitted to the Internal Medicine Teaching Service for the medical and surgical management of acute osteomyelitis of the right third toe.        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 Seen by [facility] about two weeks ago for a right foot wound and was started on doxycycline BID. Patient noticed worsening pain and redness despite daily antibiotics. SEen by [facility] on the morning of admission for a wound check due to with worsening pain and drainage. Podiatry recommended patient go to ED for IV antibiotics and further evaluation for likely amputation.   On admission patient denied nausea, vomiting, sick contacts, fevers or chills today, but does endorse fatigue and fevers up to 102.7 a few weeks ago. Seen by PCP, reassured. Patient and his wife expressed frustration and disappointment to team regarding repeat amputations. Believe T2DM was resolved and would like answers regarding source of infections. Do not think he has active diabetes or blood pressure problems.   Vitals on admission stable. Labs on admission included CBC with WBC 3.4, RBC 4.06, Hgb 12.2, HCT 36.1. CMP unremarkable, glucose 112. Lactic acid WNL at 1.4. DG and MRI of right foot concerning for acute osteomyelitis of the distal phalanx of 3rd toe. Podiatry service consulted in the ED. Given IV morphine 4 mg 1x, lorazepam 1 mg tablet, oxycodone 5 mg, started IV vancomycin.    On initial physical exam patient able to wiggle toes on both feet, dorsalis pedal pulses intact bilaterally.The dorsal surface of both feet cold to touch while the third R toe was warm to touch, erythematous, edematous, with skin atrophy, yellow crusting, and a depression/ulcer on the tuft. No discharge noted with pressure. See images above.   Principal Problem: #Acute osteomyelitis of toe, right  Etiology likely related to diabetes, neuropathy, and poor wound healing. Also considered PAD and other circulatory causes.    Imaging on admission and physical exam consistent with infection requiring IV antibiotics and surgical intervention. Patient started on broad spectrum  antibiotics IV Vancomycin and IV Zosyn pending blood cultures. Patient reported pain on R 3rd digit as 6 out of 10, improved with oxycodone 5 mg PRN.   On hospitalization day 1 (9/26), patient reported improvement in pain. Physical therapy evaluated the patient and determined no  need for outpatient follow up. Primary team ordered ABIs and consulted vascular surgery. ABIs reassuring, demonstrated triphasic flow with a toe pressure of 92 on the right and 154 on the left. Vascular surgery made recommendations to take daily baby aspirin and statin.   Podiatry proceeded with R 3rd digit amputation on 9/26. No complications reported. On hospitalization day 2 (9/27), patient reported no discharge, pain, or swelling on surgical site. Surgical site bandaged, bandage clean with no active bleeding or drainage. Patient able to move toes spontaneously on both feet. Podiatry to follow up post-op care with patient in about 1 week.    #T2DM  Chronic. Per patient, last Hgb A1C < 6. Placed on sensitive sliding scale and ordered repeat Hgb A1C. Resumed home Marcelline Deist, which patient didn't seem to know was for diabetes. Hgb A1C 5.4. Patient expressed doubt on the need for medication. Discussed the potential complications of diabetes, including poor circulation and nerve damage that can lead to ulcers and infection. Patient and his spouse continued questioning whether diabetes can explain his repeat need for amputations, may be beneficial to continue counseling patient and family on diabetes complications.   #HTN  Chronic. Blood pressures ranged 90s-120s systolic/ 50s-70s diastolic. Normal kidney function noted on admission (Cr 0.91). On day of discharge Cr 0.85. Patient's HTN medication not available on formulary, started on amlodipine 10 and ibersartan 300. Resumed home AZOR on discharge.    #OSA Chronic. CPAP ordered for nighttime used. To continue using home CPAP after discharge.    Discharge Subjective: Patient doing  well after surgery, denies any current pain. Patient also denies any CP, SOB, N/V, confusion, problems voiding, or any other acute pain. Reviewed instructions from podiatry and echoed what had been stated to patient during vascular surgery consult. Discussed how diabetes can lead to microvascular damage that can predispose him to injuries and infections even when it's well controlled. Also emphasized the importance of checking his feet regularly and making sure he is following closely with podiatry so any other future injury doesn't lead to an amputation for source control. Patient acknowledged understanding of medication changes as well, will follow up with his primary care provider Lance Bosch.   Discharge Exam:   Blood pressure 122/73, pulse 77, temperature 98.1 F (36.7 C), resp. rate 17, height 5\' 11"  (1.803 m), weight (!) 143.8 kg, SpO2 99%.  Constitutional: well-appearing male sitting in bed, in no acute distress HENT: normocephalic atraumatic, mucous membranes moist Cardiovascular: regular rate and rhythm, no m/r/g, no b/l LE edema Pulmonary/Chest: normal work of breathing on room air, lungs clear to auscultation bilaterally.  Abdominal: soft, non-tender, non-distended.  Neurological: alert & oriented, asking and answering questions appropriately.  MSK: no gross abnormalities; R foot is bandaged, bandage appears dry and clean, no bleeding or discharge observed, patient able to wiggle toes on both feet when prompted.  Skin: warm and dry Psych: normal mood and affect  Pertinent Labs, Studies, and Procedures:     Latest Ref Rng & Units 04/12/2023   12:09 PM 04/11/2023    3:30 AM 04/10/2023   11:25 AM  CBC  WBC 4.0 - 10.5 K/uL 7.4  4.4  3.4   Hemoglobin 13.0 - 17.0 g/dL 16.1  09.6  04.5   Hematocrit 39.0 - 52.0 % 38.0  35.5  36.1   Platelets 150 - 400 K/uL 204  158  171        Latest Ref Rng & Units 04/12/2023   12:09 PM 04/11/2023  3:30 AM 04/10/2023   11:25 AM  CMP  Glucose 70  - 99 mg/dL 098  119  147   BUN 8 - 23 mg/dL 18  17  16    Creatinine 0.61 - 1.24 mg/dL 8.29  5.62  1.30   Sodium 135 - 145 mmol/L 136  138  138   Potassium 3.5 - 5.1 mmol/L 4.0  4.3  4.5   Chloride 98 - 111 mmol/L 104  108  108   CO2 22 - 32 mmol/L 22  25  27    Calcium 8.9 - 10.3 mg/dL 9.0  8.4  8.9   Total Protein 6.5 - 8.1 g/dL   6.7   Total Bilirubin 0.3 - 1.2 mg/dL   0.6   Alkaline Phos 38 - 126 U/L   72   AST 15 - 41 U/L   20   ALT 0 - 44 U/L   19     DG Foot 2 Views Right  Result Date: 04/11/2023 CLINICAL DATA:  Postop. EXAM: RIGHT FOOT - 2 VIEW COMPARISON:  04/10/2023 FINDINGS: Interval resection of the third toe. Expected postsurgical change in the soft tissues. Prior second toe resection. Chronic arthropathy of the first metatarsal phalangeal joint. The exam is otherwise unchanged. IMPRESSION: Interval resection of the third toe. Electronically Signed   By: Narda Rutherford M.D.   On: 04/11/2023 19:28   VAS [place] ABI WITH/WO TBI  Result Date: 04/11/2023  LOWER EXTREMITY DOPPLER STUDY Patient Name:  Robert [name]  Date of Exam:   04/11/2023 Medical Rec #: [id number]      Accession #:    [id number] Date of Birth: 1955-12-09      Patient Gender: M Patient Age:   60 years Exam Location:  [facility] Procedure:      VAS [place] ABI WITH/WO TBI Referring Phys: JEFFREY HATCHER --------------------------------------------------------------------------------  Indications: Ulceration. DM foot infection (RLE) High Risk         Hypertension, hyperlipidemia, Diabetes, past history of Factors:          smoking. Other Factors: Hx of LLE 2,3,4 & RLE 2 toe amputations.  Comparison Study: Previous exam on 06/02/2022 Performing Technologist: [name] RVT, RDMS  Examination Guidelines: A complete evaluation includes at minimum, Doppler waveform signals and systolic blood pressure reading at the level of bilateral brachial, anterior tibial, and posterior tibial arteries, when vessel segments are accessible.  Bilateral testing is considered an integral part of a complete examination. Photoelectric Plethysmograph (PPG) waveforms and toe systolic pressure readings are included as required and additional duplex testing as needed. Limited examinations for reoccurring indications may be performed as noted.  ABI Findings: +---------+------------------+-----+---------+--------+ Right    Rt Pressure (mmHg)IndexWaveform Comment  +---------+------------------+-----+---------+--------+ Brachial 122                    triphasic         +---------+------------------+-----+---------+--------+ PTA      137               1.12 triphasic         +---------+------------------+-----+---------+--------+ DP       136               1.11 triphasic         +---------+------------------+-----+---------+--------+ Great Toe92                0.75 Normal            +---------+------------------+-----+---------+--------+ +---------+------------------+-----+---------+-------+ Left  Lt Pressure (mmHg)IndexWaveform Comment +---------+------------------+-----+---------+-------+ Brachial 112                    triphasic        +---------+------------------+-----+---------+-------+ PTA      139               1.14 triphasic        +---------+------------------+-----+---------+-------+ DP       129               1.06 triphasic        +---------+------------------+-----+---------+-------+ Great Toe154               1.26 Normal           +---------+------------------+-----+---------+-------+ +-------+-----------+-----------+------------+------------+ ABI/TBIToday's ABIToday's TBIPrevious ABIPrevious TBI +-------+-----------+-----------+------------+------------+ Right  1.12       0.75       1.26        1.23         +-------+-----------+-----------+------------+------------+ Left   1.14       1.26       1.31        1.05          +-------+-----------+-----------+------------+------------+  Summary: Right: Resting right ankle-brachial index is within normal range. The right toe-brachial index is normal. Left: Resting left ankle-brachial index is within normal range. The left toe-brachial index is normal. *See table(s) above for measurements and observations.  Electronically signed by Heath Lark on 04/11/2023 at 5:11:33 PM.    Final    MRI Right foot without contrast  Result Date: 04/10/2023 CLINICAL DATA:  Osteonecrosis suspected, foot, xray done Skin ulcer of 3rd toe right foot with necrosis of bone. History of diabetes. History of 2nd toe amputation. EXAM: MRI OF THE RIGHT FOREFOOT WITHOUT CONTRAST TECHNIQUE: Multiplanar, multisequence MR imaging of the right forefoot was performed. No intravenous contrast was administered. COMPARISON:  Radiographs 04/10/2023 and 11/27/2021.  MRI 06/09/2018. FINDINGS: Bones/Joint/Cartilage As shown on earlier radiographs, there is cortical destruction of the distal phalanx of the 3rd toe associated with diffusely decreased T1 and increased T2 marrow signal, consistent with osteomyelitis. The proximal and middle phalanges are intact. The 2nd toe has been amputated since the previous MRI. The additional toes are intact. There are advanced arthropathic changes at the 1st metatarsophalangeal joint with joint space narrowing, osteophytes and subchondral cyst formation, progressive from previous MRI. Likewise, there are progressive arthropathic changes at the Lisfranc joint without associated subluxation. Ligaments The collateral ligaments of the remaining metatarsophalangeal joints are intact. The Lisfranc ligament is intact. Muscles and Tendons Generalized forefoot muscular atrophy, as before. No significant tenosynovitis. Soft tissues Soft tissue swelling of the 3rd toe with apparent ulceration along the plantar aspect of the nail bed. No focal fluid collection identified. Mild generalized dorsal  subcutaneous edema. Probable incidental pressure lesion plantar to the 5th MTP joint, similar to previous MRI. IMPRESSION: 1. Osteomyelitis of the distal phalanx of the 3rd toe with overlying soft tissue ulceration and swelling. 2. No evidence of soft tissue abscess. 3. Interval amputation of the 2nd toe. 4. Progressive arthropathic changes at the 1st metatarsophalangeal and Lisfranc joints. Electronically Signed   By: Carey Bullocks M.D.   On: 04/10/2023 16:24   DG Foot Complete Right  Result Date: 04/10/2023 CLINICAL DATA:  74259 Infection [id number]. EXAM: RIGHT FOOT COMPLETE - 3+ VIEW COMPARISON:  11/27/2021. FINDINGS: No acute fracture or dislocation. Note is made of distal amputation of second toe at the level of metatarsophalangeal joint. There is mild  hallux valgus deformity, similar to the prior study. Since the prior study, there is new focal bone erosion/destruction of the tuft of the distal phalanx of the third toe. There is surrounding soft tissue swelling. Findings favor acute osteomyelitis. Correlate clinically to determine the need for additional imaging. There is asymmetric moderate-to-severe degenerative changes of the first metatarsophalangeal joint characterized by reduced joint space, subarticular sclerosis/cystic changes and osteophyte formation. There is irregularity of the articular surface however, no discrete focal new bone erosions noted. Findings favor advanced degenerative changes. Correlate clinically. No radiopaque foreign bodies. IMPRESSION: *Findings concerning for acute osteomyelitis of the distal phalanx of the third toe. *Asymmetric advanced degenerative changes of first metatarsophalangeal joint. Electronically Signed   By: Jules Schick M.D.   On: 04/10/2023 13:12     Discharge Instructions: Discharge Instructions     Call MD for:  difficulty breathing, headache or visual disturbances   Complete by: As directed    Call MD for:  extreme fatigue   Complete by: As  directed    Call MD for:  hives   Complete by: As directed    Call MD for:  persistant dizziness or light-headedness   Complete by: As directed    Call MD for:  persistant nausea and vomiting   Complete by: As directed    Call MD for:  redness, tenderness, or signs of infection (pain, swelling, redness, odor or green/yellow discharge around incision site)   Complete by: As directed    Call MD for:  severe uncontrolled pain   Complete by: As directed    Call MD for:  temperature >100.4   Complete by: As directed    Diet - low sodium heart healthy   Complete by: As directed    Discharge wound care:   Complete by: As directed    WBAT in Post op shoe. Sutures and dressing remain intact until follow up next week Thursday.   Increase activity slowly   Complete by: As directed      PATIENT INSTRUCTIONS:   - You were seen for osteomyelitis of the third toe on the right foot. This was treated with IV antibiotics and amputation by podiatry. Per their recommendations, you will follow up closely with them this upcoming week, and will be started on two oral antibiotics to be taken for a total of 7 days (9/28-10/04), starting tomorrow. The antibiotics are doxycyline (VIBRA-TABS) 100 mg one tablet every 12 hours and amoxicillin-clavulanate (Augmentin) one tablet every 12 hours.   - You were also seen by the vascular surgery team while you were with [place]. They reviewed potential causes for your recent toe infections and multiple toe amputations, including your history of diabetes, regardless of how well it has been controlled. As you discussed, diabetes can cause microvascular damage leading to decreased sensation, which makes it very difficult to tell when there is an abrasion on your feet that can be susceptible to infection. This is why it is VERY important to do frequent foot checks at home and annual diabetic foot exams. Additionally, they recommend re-starting a statin, which helps with cholesterol,  called rosuvastatin/Crestor 5 mg a day, as well as baby aspirin 81 mg, which will help with circulation.   - Please set up a hospitalization follow-up appointment with your primary care provider, Lance Bosch, to review the above changes and to follow up on pain management. We have given you a few days of pain medication, but please defer to her if pain worsens or continues  beyond that.   - Please call 911 if you have any emergencies, including numbness/pain in back of calves or surgical site, suspect there is an infection or bleeding from the site, or any other sudden change in your health such as chest pain, shortness of breath, or new onset nausea/vomiting/diarrhea.   - We are glad that you are feeling better, it was a pleasure taking care of you! Dr. Justin Mend and the Internal Medicine Team at [name]  Signed: Tecora Eustache Colbert Coyer, MD [facility] - PGY1 Pager: [phone] 04/12/2023, 6:24 PM    Please contact the on call pager after 5 pm and on weekends at [phone].

## 2023-04-15 LAB — CULTURE, BLOOD (ROUTINE X 2)
Culture: NO GROWTH
Culture: NO GROWTH

## 2023-04-15 LAB — SURGICAL PATHOLOGY

## 2023-04-17 LAB — AEROBIC/ANAEROBIC CULTURE W GRAM STAIN (SURGICAL/DEEP WOUND)

## 2023-04-18 ENCOUNTER — Encounter: Payer: Medicare Other | Admitting: Podiatry

## 2023-04-19 ENCOUNTER — Ambulatory Visit (INDEPENDENT_AMBULATORY_CARE_PROVIDER_SITE_OTHER): Payer: Medicare Other | Admitting: Podiatry

## 2023-04-19 DIAGNOSIS — E0843 Diabetes mellitus due to underlying condition with diabetic autonomic (poly)neuropathy: Secondary | ICD-10-CM | POA: Diagnosis not present

## 2023-04-19 DIAGNOSIS — Z89421 Acquired absence of other right toe(s): Secondary | ICD-10-CM | POA: Diagnosis not present

## 2023-04-19 NOTE — Progress Notes (Signed)
  Subjective:  Patient ID: Robert [name], male    DOB: 14-May-1956,  MRN: [id number]  Chief Complaint  Patient presents with   Routine Post Op    Post OP toe amputation on 04/11/2023.    DOS: 04/11/2023 Procedure: Amputation of third toe at MPJ level right foot  67 y.o. male returns for post-op check.  Patient returns for first postop check approximately 1 week status post right third toe amputation MPJ level.  Has kept dressing clean dry intact as instructed.  Has been walking in a postop shoe as instructed.  Review of Systems: Negative except as noted in the HPI. Denies N/V/F/Ch.   Objective:  There were no vitals filed for this visit. There is no height or weight on file to calculate BMI. Constitutional Well developed. Well nourished.  Vascular Foot warm and well perfused. Capillary refill normal to all digits.  Calf is soft and supple, no posterior calf or knee pain, negative Homans' sign  Neurologic Normal speech. Oriented to person, place, and time. Epicritic sensation to light touch grossly present bilaterally.  Dermatologic Skin healing well without signs of infection. Skin edges well coapted without signs of infection.  Orthopedic: No enderness to palpation noted about the surgical site.   Multiple view plain film radiographs: Deferred Assessment:   1. History of amputation of lesser toe of right foot (HCC)   2. Diabetes mellitus due to underlying condition with diabetic autonomic neuropathy, unspecified whether long term insulin use (HCC)    Plan:  Patient was evaluated and treated and all questions answered.  S/p foot surgery right -Progressing as expected post-operatively. No evidence of dehisence or residual infeciton at this time -XR: Deferred -WB Status: WBAT in Post op shoe -Sutures: To remain intact. -Medications: Non needed, finish Abx -Foot redressed. To remain intact until next apt  Return in about 1 week (around 04/26/2023) for 2nd POV R 3rd toe amp.          Corinna Gab, DPM [facility] / [facility]

## 2023-04-25 ENCOUNTER — Ambulatory Visit: Payer: Medicare Other | Admitting: Podiatry

## 2023-04-25 DIAGNOSIS — Z89421 Acquired absence of other right toe(s): Secondary | ICD-10-CM | POA: Diagnosis not present

## 2023-04-25 DIAGNOSIS — E0843 Diabetes mellitus due to underlying condition with diabetic autonomic (poly)neuropathy: Secondary | ICD-10-CM | POA: Diagnosis not present

## 2023-04-25 MED ORDER — CLINDAMYCIN HCL 300 MG PO CAPS: 300.0000 mg | ORAL_CAPSULE | Freq: Three times a day (TID) | ORAL | 0 refills | Status: DC

## 2023-04-25 NOTE — Progress Notes (Signed)
  Subjective:  Patient ID: Robert [name], male    DOB: October 02, 1955,  MRN: [id number]  Chief Complaint  Patient presents with   POV Amputation    Right foot    DOS: 04/11/2023 Procedure: Amputation of third toe at MPJ level right foot  67 y.o. male returns for post-op check.  Patient returns for postop check approximately 2 week status post right third toe amputation MPJ level.  Has kept dressing clean dry intact as instructed.  Has been walking in a postop shoe as instructed.  Review of Systems: Negative except as noted in the HPI. Denies N/V/F/Ch.   Objective:  There were no vitals filed for this visit. There is no height or weight on file to calculate BMI. Constitutional Well developed. Well nourished.  Vascular Foot warm and well perfused. Capillary refill normal to all digits.  Calf is soft and supple, no posterior calf or knee pain, negative Homans' sign  Neurologic Normal speech. Oriented to person, place, and time. Epicritic sensation to light touch grossly present bilaterally.  Dermatologic Skin without evidence of dehiscence at the surgical site however there is eschar and erythema at the incision site.  Mild maceration.  No malodor or dehiscence detected.   Orthopedic: No enderness to palpation noted about the surgical site.   Multiple view plain film radiographs: Deferred Assessment:   1. History of amputation of lesser toe of right foot (HCC)   2. Diabetes mellitus due to underlying condition with diabetic autonomic neuropathy, unspecified whether long term insulin use (HCC)     Plan:  Patient was evaluated and treated and all questions answered.  S/p foot surgery right -Progressing as expected post-operatively. No evidence of dehisence but does have evidence of mild incisional cellulitis at this time we will add clindamycin 300 mg 3 times daily for 7 days for soft tissue coverage as the area continues to heal -XR: Deferred -WB Status: WBAT in Post op  shoe -Sutures: To remain intact. -Medications: eRx for clindamycin as above -Foot redressed.  Recommend every other day Betadine dressing changes to the incision site.  Instructed patient to pat the surgical site with Betadine and then cover with 2 Band-Aids. -Instructed if there is worsening redness dehiscence or systemic signs of infection to proceed to the emergency department for further evaluation prior to his appointment in 1 week         Corinna Gab, DPM [facility] / [facility]

## 2023-04-26 ENCOUNTER — Telehealth: Payer: Self-pay | Admitting: Podiatry

## 2023-04-26 ENCOUNTER — Other Ambulatory Visit: Payer: Self-pay | Admitting: Podiatry

## 2023-04-26 MED ORDER — CLINDAMYCIN HCL 300 MG PO CAPS: 300.0000 mg | ORAL_CAPSULE | Freq: Three times a day (TID) | ORAL | 0 refills | Status: AC

## 2023-04-26 NOTE — Telephone Encounter (Signed)
Notified pt was sent to new pharmacy

## 2023-04-26 NOTE — Progress Notes (Signed)
Rx for clindamycin sent to new pharmacy

## 2023-04-26 NOTE — Telephone Encounter (Signed)
Pt called and the [facility] sent the [facility] to does not have it in stock. Can you please [facility] to the [facility].

## 2023-05-02 ENCOUNTER — Encounter: Payer: Self-pay | Admitting: Podiatry

## 2023-05-02 ENCOUNTER — Encounter: Payer: Medicare Other | Admitting: Podiatry

## 2023-05-02 ENCOUNTER — Ambulatory Visit: Payer: Medicare Other | Admitting: Podiatry

## 2023-05-02 DIAGNOSIS — E0843 Diabetes mellitus due to underlying condition with diabetic autonomic (poly)neuropathy: Secondary | ICD-10-CM | POA: Diagnosis not present

## 2023-05-02 DIAGNOSIS — Z89421 Acquired absence of other right toe(s): Secondary | ICD-10-CM | POA: Diagnosis not present

## 2023-05-02 NOTE — Progress Notes (Signed)
  Subjective:  Patient ID: Robert [name], male    DOB: [birth year],  MRN: [id number]  Chief Complaint  Patient presents with   Routine Post Op    Right 3rd toe amputation. Still finishing clindamycin denies pain.    DOS: 04/11/2023 Procedure: Amputation of third toe at MPJ level right foot  67 y.o. male returns for post-op check.  Patient returns for postop check approximately 3 week status post right third toe amputation MPJ level.   Patient reports he is doing very well.  Denies any issues.  Finishing clindamycin.  Has been dressing the surgical site with Betadine and gauze or Band-Aid as instructed on a daily basis  Review of Systems: Negative except as noted in the HPI. Denies N/V/F/Ch.   Objective:  There were no vitals filed for this visit. There is no height or weight on file to calculate BMI. Constitutional Well developed. Well nourished.  Vascular Foot warm and well perfused. Capillary refill normal to all digits.  Calf is soft and supple, no posterior calf or knee pain, negative Homans' sign  Neurologic Normal speech. Oriented to person, place, and time. Epicritic sensation to light touch grossly present bilaterally.  Dermatologic Skin without evidence of dehiscence at the surgical site with decreased erythema from prior.  Eschar overlying was debrided with healthy underlying amputation site.  No dehiscence or residual sign of infection   Orthopedic: No enderness to palpation noted about the surgical site.   Multiple view plain film radiographs: Deferred Assessment:   1. History of amputation of lesser toe of right foot (HCC)   2. Diabetes mellitus due to underlying condition with diabetic autonomic neuropathy, unspecified whether long term insulin use (HCC)      Plan:  Patient was evaluated and treated and all questions answered.  S/p foot surgery right -Progressing as expected post-operatively.   improved from prior and continues to heal with decreased cellulitis  at the incision site -XR: Deferred -WB Status: WBAT in Post op shoe for 1 more week and then transition back to regular shoes -Will set up appointment for him to be scheduled for diabetic shoes and liners with pedorthist -Sutures: Removed in total at this visit -Medications: Finish clindamycin and then no further antibiotics indicated -Foot redressed with antibiotic ointment and Band-Aid, no further dressing care needed         Corinna Gab, DPM [facility] / [facility]

## 2023-05-24 ENCOUNTER — Encounter: Payer: Self-pay | Admitting: Podiatry

## 2023-05-30 ENCOUNTER — Ambulatory Visit (INDEPENDENT_AMBULATORY_CARE_PROVIDER_SITE_OTHER): Payer: Medicare Other | Admitting: Podiatry

## 2023-05-30 ENCOUNTER — Other Ambulatory Visit: Payer: Medicare Other

## 2023-05-30 DIAGNOSIS — Z91199 Patient's noncompliance with other medical treatment and regimen due to unspecified reason: Secondary | ICD-10-CM

## 2023-05-30 NOTE — Progress Notes (Signed)
 Patient absent for apointment

## 2023-07-25 ENCOUNTER — Ambulatory Visit: Payer: Medicare Other | Admitting: Podiatry

## 2023-07-25 ENCOUNTER — Encounter: Payer: Self-pay | Admitting: Podiatry

## 2023-07-25 DIAGNOSIS — Z89421 Acquired absence of other right toe(s): Secondary | ICD-10-CM

## 2023-07-25 DIAGNOSIS — M79674 Pain in right toe(s): Secondary | ICD-10-CM

## 2023-07-25 DIAGNOSIS — M79675 Pain in left toe(s): Secondary | ICD-10-CM | POA: Diagnosis not present

## 2023-07-25 DIAGNOSIS — E08621 Diabetes mellitus due to underlying condition with foot ulcer: Secondary | ICD-10-CM

## 2023-07-25 DIAGNOSIS — L97522 Non-pressure chronic ulcer of other part of left foot with fat layer exposed: Secondary | ICD-10-CM | POA: Diagnosis not present

## 2023-07-25 DIAGNOSIS — B351 Tinea unguium: Secondary | ICD-10-CM

## 2023-07-25 DIAGNOSIS — E0843 Diabetes mellitus due to underlying condition with diabetic autonomic (poly)neuropathy: Secondary | ICD-10-CM

## 2023-07-25 DIAGNOSIS — M205X2 Other deformities of toe(s) (acquired), left foot: Secondary | ICD-10-CM

## 2023-07-25 DIAGNOSIS — Z89422 Acquired absence of other left toe(s): Secondary | ICD-10-CM

## 2023-07-25 DIAGNOSIS — M2041 Other hammer toe(s) (acquired), right foot: Secondary | ICD-10-CM

## 2023-07-25 MED ORDER — MUPIROCIN 2 % EX OINT: 1.0000 | TOPICAL_OINTMENT | Freq: Two times a day (BID) | CUTANEOUS | 0 refills | Status: AC

## 2023-07-25 NOTE — Progress Notes (Signed)
 Chief Complaint  Patient presents with   Routine Post Op    Right toe amp. F/U, left great toe calloused    HPI: 68 y.o. male presents today for diabetic foot evaluation.  He has history of amputation of right second and third toes, partial amputation of toes 2, 3, 4 left foot.  He is requesting diabetic shoes.  He does report callus in the area of skin separation on the left great toe and is concerned about wound formation here.  He states that he has been applying Betadine on this area.  Toenails of the remaining digits are painful, elongated, dystrophic and the patient is unable to maintain them himself due to their thickness and length and mobility issues.  Last A1c 5.4.  Past Medical History:  Diagnosis Date   Allergic rhinitis due to pollen    Arthritis    Asthma    Back pain    Barrett esophagus    Depressive disorder    Diabetes mellitus type 2 in obese    Exposure to hepatitis B    Exposure to hepatitis C    Narcotic abuse (HCC)    pain medications   OSA (obstructive sleep apnea)    on CPAP     Past Surgical History:  Procedure Laterality Date   ABDOMINAL SURGERY     AMPUTATION Right 06/11/2018   Procedure: RIGHT 2ND RAY AMPUTATION;  Surgeon: Harden Jerona GAILS, MD;  Location: [facility] OR;  Service: Orthopedics;  Laterality: Right;   AMPUTATION TOE Left 06/02/2022   Procedure: LEFT FOURTH PARTIAL TOE AMPUTATION;  Surgeon: Silva Juliene SAUNDERS, DPM;  Location: [facility] ORS;  Service: Podiatry;  Laterality: Left;   AMPUTATION TOE Right 04/11/2023   Procedure: AMPUTATION TOE;  Surgeon: Malvin Marsa FALCON, DPM;  Location: [facility] OR;  Service: Orthopedics/Podiatry;  Laterality: Right;  Right 3rd toe amputation   BACK SURGERY     Rods and screws in lumbar area   COLONOSCOPY     ESOPHAGOGASTRODUODENOSCOPY  multiple   HERNIA REPAIR     6   TOTAL KNEE ARTHROPLASTY  2009   left    Allergies  Allergen Reactions   Onion Other (See Comments)    Stomach Irritation     ROS denies  any nausea, vomiting, fever, chills, chest pain, shortness of breath.   Physical Exam: There were no vitals filed for this visit.  General: The patient is alert and oriented x3 in no acute distress.  Dermatology: Interspaces clean and dry.  Pedal skin atrophic.  Left hallux hyperkeratotic tissue to dorsal medial aspect with area of fissuring and small underlying wound measuring 0.3 x 0.4 x 0.2 cm.  There is ulcer present to plantar first interphalangeal joint left foot measuring 0.3 x 0.3 x 0.2 cm predebridement, postdebridement measures 1 x 0.5 x 0.3 cm postdebridement with granulation tissue to wound bed.  Macerated wound edges.  No erythema or significant drainage, mild edema to the left hallux.  Left first and fifth toe nail, right first, fourth, fifth toenails are notable for increased thickness, increased length, dystrophic changes with yellow discoloration and subungual debris.  Tender on direct dorsal palpation  Vascular: Foot appears warm and well-perfused.  Palpable DP pulses faintly palpable PT pulses bilaterally.  Cap refill time less than 3 secs to digits.  Pedal hair growth absent.  Neurological: Light touch sensation grossly intact bilateral feet.  Protective sensation absent.  Vibratory sensation diminished.  Musculoskeletal Exam: Previous partial toe amputations left  foot second, third, fourth toes, previous second and third toe amputation right foot.  Hammertoe contractures of the remaining toes of right foot.  Left foot hallux limitus noted with valgus angulation.  Radiographic Exam:  Deferred  Assessment/Plan of Care: 1. 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 Diabetes mellitus due to underlying condition with diabetic autonomic neuropathy, unspecified whether long term insulin  use (HCC)   3. History of amputation of lesser toe of right foot (HCC)   4. Pain due to onychomycosis of  toenails of both feet   5. History of amputation of lesser toe of left foot (HCC)   6. Hallux limitus of left foot   7. Hammer toe of right foot      Meds ordered this encounter  Medications   mupirocin  ointment (BACTROBAN ) 2 %    Sig: Apply 1 Application topically 2 (two) times daily for 14 days.    Dispense:  28 g    Refill:  0   FOR HOME USE ONLY DME DIABETIC SHOE  Discussed clinical findings with patient today.  # Diabetic ulcer right first toe x 2 with fat layer exposed -Verbal consent obtained perform excisional debridement of nonviable skin and subcutaneous tissue.  Pre and postdebridement measurements as described above.  Hemostasis achieved with silver nitrate and hemostasis.  No anesthesia required secondary to neuropathy.  Dressed with antibiotic ointment and bandage. -Discussed importance of limited stance and gait per patient.  Will use postop shoe that he has at home -Prescription written for mupirocin  ointment to be applied twice a day.  Wound care instructions discussed with patient -Obtaining x-rays at follow-up if there is no significant improvement  # Painful onychomycosis -Nail plates x 5 are debrided in thickness and length using sterile nail nippers.  Mechanical bur used to file nails then  # Diabetic neuropathy with multiple toe amputations, right foot hammertoes, left foot hallux limitus -Patient would benefit from diabetic shoes and custom inserts x 3 - Prescription written -I certify that this diagnosis represents a distinct and separate diagnosis that requires evaluation and treatment separate from other procedures or diagnosis    Follow-up in 2 weeks for wound care  Samreet Edenfield L. Lamount MAUL, AACFAS [facility]     [address] [place], [place] [address]                Office [phone]  Fax [phone]

## 2023-07-25 NOTE — Patient Instructions (Addendum)
 More silicone pads can be purchased from:  [email or web address]   Practice limited stance and gait to the left foot.  Use surgical shoe that you have at home.  If you notice worsening redness, swelling, drainage contact the office and seek medical attention. Apply mupirocin  ointment to the wounds and cover with dry bandage twice a day. Keep Dressing clean and dry

## 2023-08-09 ENCOUNTER — Ambulatory Visit: Payer: Medicare Other | Admitting: Podiatry

## 2023-08-09 ENCOUNTER — Telehealth: Payer: Self-pay | Admitting: Podiatry

## 2023-08-09 NOTE — Telephone Encounter (Signed)
Pt left message today at 122am stating he was sick and could not make appt today at 830.  I cxled appt and called pt and left message that I got his message and to please call back to get rescheduled.

## 2023-08-19 ENCOUNTER — Telehealth: Payer: Self-pay | Admitting: Podiatry

## 2023-08-19 ENCOUNTER — Other Ambulatory Visit: Payer: Medicare Other

## 2023-08-19 NOTE — Telephone Encounter (Signed)
Per vm from pt on 2/2 at 147am pt needs to cxl appt for 2/3 as he has tested positive for covid. Will r/s in a few weeks

## 2023-10-07 ENCOUNTER — Ambulatory Visit: Payer: Medicare Other

## 2023-10-07 DIAGNOSIS — E0843 Diabetes mellitus due to underlying condition with diabetic autonomic (poly)neuropathy: Secondary | ICD-10-CM

## 2023-10-07 DIAGNOSIS — E08621 Diabetes mellitus due to underlying condition with foot ulcer: Secondary | ICD-10-CM

## 2023-10-07 DIAGNOSIS — M2141 Flat foot [pes planus] (acquired), right foot: Secondary | ICD-10-CM

## 2023-10-07 NOTE — Progress Notes (Signed)
 Patient presents to the office today for diabetic shoe and insole measuring.  Patient was measured with brannock device to determine size and width for 1 pair of extra depth shoes and foam casted for 3 pair of insoles.   Documentation of medical necessity will be sent to patient's treating diabetic doctor to verify and sign.   Patient's diabetic provider: Windle Guard MD   Shoes and insoles will be ordered at that time and patient will be notified for an appointment for fitting when they arrive.   Shoe size (per patient): 12 Brannock measurement: 12XWD Shoe choice:   P2154M / P2160M Shoe size ordered: 12XWD  Ppw signed

## 2023-11-11 ENCOUNTER — Encounter (INDEPENDENT_AMBULATORY_CARE_PROVIDER_SITE_OTHER): Payer: Self-pay

## 2023-11-22 ENCOUNTER — Telehealth: Payer: Self-pay

## 2023-11-22 NOTE — Telephone Encounter (Signed)
 Called to schedule DM shoe PU

## 2023-11-27 ENCOUNTER — Encounter: Payer: Self-pay | Admitting: Podiatry

## 2023-11-27 ENCOUNTER — Ambulatory Visit (INDEPENDENT_AMBULATORY_CARE_PROVIDER_SITE_OTHER)

## 2023-11-27 ENCOUNTER — Ambulatory Visit: Admitting: Podiatry

## 2023-11-27 VITALS — Ht 71.0 in | Wt 317.0 lb

## 2023-11-27 DIAGNOSIS — L97522 Non-pressure chronic ulcer of other part of left foot with fat layer exposed: Secondary | ICD-10-CM | POA: Diagnosis not present

## 2023-11-27 DIAGNOSIS — E08621 Diabetes mellitus due to underlying condition with foot ulcer: Secondary | ICD-10-CM

## 2023-11-27 MED ORDER — DOXYCYCLINE HYCLATE 100 MG PO TABS: 100.0000 mg | ORAL_TABLET | Freq: Two times a day (BID) | ORAL | 1 refills | Status: DC

## 2023-11-28 NOTE — Progress Notes (Signed)
 Subjective:   Patient ID: Robert [name], male   DOB: 68 y.o.   MRN: [id number]   HPI Patient states that he has had problems for the last few weeks with a lesion on the left big toe that he has been working on himself and has had previous amputation of lesser digits bilateral   ROS      Objective:  Physical Exam  History diabetes under good control does have neuropathic like disease with extreme pressure on his digits with elongated hallux bilateral that there is related to his natural bone structure and the loss of bone content second third fourth toes with the area found to be approximately 8 mm x 1 cm on the left hallux mild erythema of the toe localized no proximal edema erythema drainage no systemic sign of infection and no subcutaneous exposure     Assessment:  Ulceration left big toe secondary to pressure possibility mild infection cannot rule out osteomyelitis or other pathology     Plan:  Patient  education rendered condition discussed and we will start wet-to-dry dressings with a diluted Betadine solution and offloading the area with strict instructions if increased erythema edema drainage to contact us  immediately and patient is at high risk for eventual amputation.  I then went ahead and placed on doxycycline  twice daily and with patient to be reevaluated in the next several weeks and x-rays today  X-rays today do indicate elongated left hallux with history of amputation lesser digits no indications currently of osteolysis or bone infection

## 2023-12-19 ENCOUNTER — Ambulatory Visit: Admitting: Podiatry

## 2023-12-19 ENCOUNTER — Encounter: Payer: Self-pay | Admitting: Podiatry

## 2023-12-19 VITALS — Ht 71.0 in | Wt 317.0 lb

## 2023-12-19 DIAGNOSIS — M2142 Flat foot [pes planus] (acquired), left foot: Secondary | ICD-10-CM | POA: Diagnosis not present

## 2023-12-19 DIAGNOSIS — E1140 Type 2 diabetes mellitus with diabetic neuropathy, unspecified: Secondary | ICD-10-CM

## 2023-12-19 DIAGNOSIS — E08621 Diabetes mellitus due to underlying condition with foot ulcer: Secondary | ICD-10-CM

## 2023-12-19 DIAGNOSIS — L97522 Non-pressure chronic ulcer of other part of left foot with fat layer exposed: Secondary | ICD-10-CM

## 2023-12-19 DIAGNOSIS — M2141 Flat foot [pes planus] (acquired), right foot: Secondary | ICD-10-CM | POA: Diagnosis not present

## 2023-12-19 DIAGNOSIS — Z89422 Acquired absence of other left toe(s): Secondary | ICD-10-CM

## 2023-12-19 DIAGNOSIS — Z89421 Acquired absence of other right toe(s): Secondary | ICD-10-CM

## 2023-12-19 DIAGNOSIS — E1151 Type 2 diabetes mellitus with diabetic peripheral angiopathy without gangrene: Secondary | ICD-10-CM | POA: Diagnosis not present

## 2023-12-19 DIAGNOSIS — M2041 Other hammer toe(s) (acquired), right foot: Secondary | ICD-10-CM

## 2023-12-19 MED ORDER — DOXYCYCLINE HYCLATE 100 MG PO TABS
100.0000 mg | ORAL_TABLET | Freq: Two times a day (BID) | ORAL | 0 refills | Status: AC
Start: 2023-12-19 — End: 2023-12-29

## 2023-12-19 NOTE — Progress Notes (Signed)

## 2023-12-19 NOTE — Patient Instructions (Signed)
 More silicone and felt pads can be purchased from:  [email or web address]  Look for callus pads or proportion pads.  Can also findings on [employer], [facility], [employer], [employer]

## 2023-12-19 NOTE — Progress Notes (Signed)
 Chief Complaint  Patient presents with   Wound Check    " He is here for a wound check on his left foot"     HPI: 68 y.o. male presenting today for follow-up evaluation of left hallux ulceration.  He is completed 2 weeks of doxycycline .  Patient reports that the redness and swelling is improved.  He has been continuing with Betadine wet-to-dry dressings.  Past Medical History:  Diagnosis Date   Allergic rhinitis due to pollen    Arthritis    Asthma    Back pain    Barrett esophagus    Depressive disorder    Diabetes mellitus type 2 in obese    Exposure to hepatitis B    Exposure to hepatitis C    Narcotic abuse (HCC)    pain medications   OSA (obstructive sleep apnea)    on CPAP    Past Surgical History:  Procedure Laterality Date   ABDOMINAL SURGERY     AMPUTATION Right 06/11/2018   Procedure: RIGHT 2ND RAY AMPUTATION;  Surgeon: Timothy Ford, MD;  Location: [facility] OR;  Service: Orthopedics;  Laterality: Right;   AMPUTATION TOE Left 06/02/2022   Procedure: LEFT FOURTH PARTIAL TOE AMPUTATION;  Surgeon: Floyce Hutching, DPM;  Location: [facility] ORS;  Service: Podiatry;  Laterality: Left;   AMPUTATION TOE Right 04/11/2023   Procedure: AMPUTATION TOE;  Surgeon: Evertt Hoe, DPM;  Location: [facility] OR;  Service: Orthopedics/Podiatry;  Laterality: Right;  Right 3rd toe amputation   BACK SURGERY     Rods and screws in lumbar area   COLONOSCOPY     ESOPHAGOGASTRODUODENOSCOPY  multiple   HERNIA REPAIR     6   TOTAL KNEE ARTHROPLASTY  2009   left   Allergies  Allergen Reactions   Onion Other (See Comments)    Stomach Irritation       PHYSICAL EXAM: There were no vitals filed for this visit.  General: The patient is alert and oriented x3 in no acute distress.  Dermatology: Skin is warm, dry and supple bilateral lower extremities. Interspaces are clear of maceration and debris.      Left hallux plantar medial interphalangeal joint ulceration present measuring 1.1 x 0.5  x 0.3 cm predebridement.  Postdebridement measurements 1.2 x 0.7 x 0.3 cm there is periwound callus with some hemorrhagic tissue present.  Some mild erythema present about the toe.  Vascular: Pedal pulses are palpable bilaterally.  Capillary refill time less than 3 seconds to the digits.  Pedal hair growth decreased  Neurological: Protective sensation decreased.  Vibratory sensation decreased  Musculoskeletal Exam: Prior partial amputation of second, third, fourth toes left foot.  Hammertoe contractures of remaining digits right foot.  There are some deformity present left first toe interphalangeal joint with decreased first MPJ range of motion.     Latest Ref Rng & Units 04/10/2023    5:48 PM  Hemoglobin A1C  Hemoglobin-A1c 4.8 - 5.6 % 5.4        ASSESSMENT / PLAN OF CARE: 1. 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 History of amputation of lesser toe of left foot (HCC)   3. Type 2 diabetes mellitus with diabetic neuropathy, unspecified whether long term insulin  use (HCC)   4. Pes planus of both feet   5. Hammer toe of right foot      Meds ordered this encounter  Medications  doxycycline  (VIBRA -TABS) 100 MG tablet    Sig: Take 1 tablet (100 mg total) by mouth 2 (two) times daily for 10 days.    Dispense:  20 tablet    Refill:  0   None  Left hallux plantar medial interphalangeal joint: The ulceration was sharply debrided of hyperkeratotic and devitalized soft tissue with sterile #312 blade to the level of subcutaneous tissue.  Hemostasis obtained.  Antibiotic ointment and DSD applied.  Reviewed off-loading with patient.  Offloading felt padding applied.  Reviewed daily dressing changes with patient.  Has home mupirocin  which she will continue to use.  Extending course of doxycycline  due to some residual redness out of abundance of caution.  Discussed risks / concerns regarding ulcer  with patient and possible sequelae if left untreated.  Stressed importance of infection prevention at home. Short-term goals are: prevent infection, off-load ulcer, heal ulcer Long-term goals are:  prevent recurrence, prevent amputation.   Getting his diabetic shoes dispensed today, hopefully this will help offload the ulceration. Necessary due to concurrent pes planus and hammertoe deformities.  Return in about 2 weeks (around 01/02/2024) for Wound Care.   Ranika Mcniel L. Lunda Salines, FACFAS [facility]     [address] [place], [place] [address]                Office [phone]  Fax [phone]

## 2024-01-02 ENCOUNTER — Telehealth: Payer: Self-pay

## 2024-01-02 NOTE — Telephone Encounter (Signed)
 3 extra pr of inserts found for patient called him he can pick up when here next they are in cupboard in LAB / no charge

## 2024-01-03 ENCOUNTER — Ambulatory Visit: Admitting: Podiatry

## 2024-01-20 ENCOUNTER — Other Ambulatory Visit (HOSPITAL_COMMUNITY): Payer: Self-pay

## 2024-01-24 ENCOUNTER — Ambulatory Visit: Admitting: Podiatry

## 2024-01-24 ENCOUNTER — Encounter: Payer: Self-pay | Admitting: Podiatry

## 2024-01-24 DIAGNOSIS — L97522 Non-pressure chronic ulcer of other part of left foot with fat layer exposed: Secondary | ICD-10-CM

## 2024-01-24 DIAGNOSIS — E08621 Diabetes mellitus due to underlying condition with foot ulcer: Secondary | ICD-10-CM

## 2024-01-24 DIAGNOSIS — E1140 Type 2 diabetes mellitus with diabetic neuropathy, unspecified: Secondary | ICD-10-CM

## 2024-01-24 NOTE — Progress Notes (Unsigned)
 Chief Complaint  Patient presents with   Foot Ulcer     wound check on his left foot great toe. 0 pain. 90% better. NIDDM A1C 6.1.    HPI: 68 y.o. male presenting today for follow-up evaluation of left hallux ulceration.  He feels that overall he is doing very well.  He denies any pain or signs of this.  He has been ambulating with dry dressings overall.  Past Medical History:  Diagnosis Date   Allergic rhinitis due to pollen    Arthritis    Asthma    Back pain    Barrett esophagus    Depressive disorder    Diabetes mellitus type 2 in obese    Exposure to hepatitis B    Exposure to hepatitis C    Narcotic abuse (HCC)    pain medications   OSA (obstructive sleep apnea)    on CPAP    Past Surgical History:  Procedure Laterality Date   ABDOMINAL SURGERY     AMPUTATION Right 06/11/2018   Procedure: RIGHT 2ND RAY AMPUTATION;  Surgeon: Harden Jerona GAILS, MD;  Location: [facility] OR;  Service: Orthopedics;  Laterality: Right;   AMPUTATION TOE Left 06/02/2022   Procedure: LEFT FOURTH PARTIAL TOE AMPUTATION;  Surgeon: Silva Juliene SAUNDERS, DPM;  Location: [facility];  Service: Podiatry;  Laterality: Left;   AMPUTATION TOE Right 04/11/2023   Procedure: AMPUTATION TOE;  Surgeon: Malvin Marsa FALCON, DPM;  Location: [facility] OR;  Service: Orthopedics/Podiatry;  Laterality: Right;  Right 3rd toe amputation   BACK SURGERY     Rods and screws in lumbar area   COLONOSCOPY     ESOPHAGOGASTRODUODENOSCOPY  multiple   HERNIA REPAIR     6   TOTAL KNEE ARTHROPLASTY  2009   left   Allergies  Allergen Reactions   Onion Other (See Comments)    Stomach Irritation       PHYSICAL EXAM: There were no vitals filed for this visit.  General: The patient is alert and oriented x3 in no acute distress.  Dermatology: Skin is warm, dry and supple bilateral lower extremities. Interspaces are clear of maceration and debris.      Left hallux plantar medial interphalangeal joint ulceration present with overlying  callus.  Following debridement of the callus, the ulceration measures 0.5 x 0.3 x 0.2 cm postdebridement.  Excellent wound healing progression noted.  Involvement of subcutaneous tissue present.  Granulation tissue to wound bed.  No signs of acute bacterial infection.  Vascular: Pedal pulses are palpable bilaterally.  Capillary refill time less than 3 seconds to the digits.  Pedal hair growth decreased  Neurological: Protective sensation decreased.  Vibratory sensation decreased  Musculoskeletal Exam: Prior partial amputation of second, third, fourth toes left foot.  Hammertoe contractures of remaining digits right foot.  There are some deformity present left first toe interphalangeal joint with decreased first MPJ range of motion.     Latest Ref Rng & Units 04/10/2023    5:48 PM  Hemoglobin A1C  Hemoglobin-A1c 4.8 - 5.6 % 5.4        ASSESSMENT / PLAN OF CARE: 1. 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 Type 2 diabetes mellitus with diabetic neuropathy, unspecified whether long term insulin  use (HCC)      No orders of the defined types were placed in this encounter.  None  Left hallux plantar medial interphalangeal joint: The ulceration was  sharply debrided of hyperkeratotic and devitalized soft tissue with sterile #312 blade to the level of subcutaneous tissue.  Hemostasis obtained.  Antibiotic bandage applied.  Reviewed off-loading with patient.  Offloading felt padding applied.  Reviewed daily dressing changes with patient.  Can continue with Betadine wet-to-dry dressings.  No further antibiotics at this point in time.  Discussed risks / concerns regarding ulcer with patient and possible sequelae if left untreated.  Stressed importance of infection prevention at home. Short-term goals are: prevent infection, off-load ulcer, heal ulcer Long-term goals are:  prevent recurrence, prevent  amputation.    Return in about 3 weeks (around 02/14/2024) for Ulcer Check.   Gisel Vipond L. Lamount MAUL, FACFAS [facility]     [address] [place], [place] [address]                Office [phone]  Fax [phone]

## 2024-01-27 ENCOUNTER — Telehealth: Payer: Self-pay | Admitting: Podiatry

## 2024-01-27 NOTE — Telephone Encounter (Signed)
 I called the patient to reschedule his appointment. The patient didn't answer I advised him to give us  a call back to reschedule his appointment.

## 2024-02-02 ENCOUNTER — Other Ambulatory Visit: Payer: Self-pay

## 2024-02-02 ENCOUNTER — Encounter (HOSPITAL_COMMUNITY): Payer: Self-pay

## 2024-02-02 ENCOUNTER — Inpatient Hospital Stay (HOSPITAL_COMMUNITY)
Admission: EM | Admit: 2024-02-02 | Discharge: 2024-02-11 | DRG: 552 | Disposition: A | Discharge status: Home Health | Attending: Internal Medicine | Admitting: Internal Medicine

## 2024-02-02 DIAGNOSIS — Z91199 Patient's noncompliance with other medical treatment and regimen due to unspecified reason: Secondary | ICD-10-CM

## 2024-02-02 DIAGNOSIS — E871 Hypo-osmolality and hyponatremia: Secondary | ICD-10-CM | POA: Diagnosis present

## 2024-02-02 DIAGNOSIS — Z7985 Long-term (current) use of injectable non-insulin antidiabetic drugs: Secondary | ICD-10-CM

## 2024-02-02 DIAGNOSIS — M549 Dorsalgia, unspecified: Secondary | ICD-10-CM

## 2024-02-02 DIAGNOSIS — E785 Hyperlipidemia, unspecified: Secondary | ICD-10-CM | POA: Diagnosis present

## 2024-02-02 DIAGNOSIS — M48061 Spinal stenosis, lumbar region without neurogenic claudication: Secondary | ICD-10-CM | POA: Diagnosis not present

## 2024-02-02 DIAGNOSIS — I10 Essential (primary) hypertension: Secondary | ICD-10-CM | POA: Diagnosis present

## 2024-02-02 DIAGNOSIS — F1110 Opioid abuse, uncomplicated: Secondary | ICD-10-CM | POA: Diagnosis present

## 2024-02-02 DIAGNOSIS — M5117 Intervertebral disc disorders with radiculopathy, lumbosacral region: Secondary | ICD-10-CM | POA: Diagnosis present

## 2024-02-02 DIAGNOSIS — M545 Low back pain, unspecified: Secondary | ICD-10-CM | POA: Diagnosis not present

## 2024-02-02 DIAGNOSIS — Z6841 Body Mass Index (BMI) 40.0 and over, adult: Secondary | ICD-10-CM

## 2024-02-02 DIAGNOSIS — M5416 Radiculopathy, lumbar region: Secondary | ICD-10-CM

## 2024-02-02 DIAGNOSIS — M5116 Intervertebral disc disorders with radiculopathy, lumbar region: Secondary | ICD-10-CM | POA: Diagnosis present

## 2024-02-02 DIAGNOSIS — K2270 Barrett's esophagus without dysplasia: Secondary | ICD-10-CM | POA: Diagnosis present

## 2024-02-02 DIAGNOSIS — Z79899 Other long term (current) drug therapy: Secondary | ICD-10-CM

## 2024-02-02 DIAGNOSIS — L97529 Non-pressure chronic ulcer of other part of left foot with unspecified severity: Secondary | ICD-10-CM | POA: Diagnosis present

## 2024-02-02 DIAGNOSIS — Z7982 Long term (current) use of aspirin: Secondary | ICD-10-CM

## 2024-02-02 DIAGNOSIS — Z981 Arthrodesis status: Secondary | ICD-10-CM

## 2024-02-02 DIAGNOSIS — I959 Hypotension, unspecified: Secondary | ICD-10-CM | POA: Diagnosis present

## 2024-02-02 DIAGNOSIS — M1712 Unilateral primary osteoarthritis, left knee: Secondary | ICD-10-CM | POA: Diagnosis present

## 2024-02-02 DIAGNOSIS — G629 Polyneuropathy, unspecified: Secondary | ICD-10-CM | POA: Diagnosis present

## 2024-02-02 DIAGNOSIS — G4733 Obstructive sleep apnea (adult) (pediatric): Secondary | ICD-10-CM | POA: Diagnosis present

## 2024-02-02 DIAGNOSIS — G8929 Other chronic pain: Secondary | ICD-10-CM | POA: Diagnosis present

## 2024-02-02 DIAGNOSIS — E66813 Obesity, class 3: Secondary | ICD-10-CM | POA: Diagnosis present

## 2024-02-02 DIAGNOSIS — M5441 Lumbago with sciatica, right side: Principal | ICD-10-CM

## 2024-02-02 DIAGNOSIS — K5900 Constipation, unspecified: Secondary | ICD-10-CM | POA: Diagnosis present

## 2024-02-02 DIAGNOSIS — Z91040 Latex allergy status: Secondary | ICD-10-CM

## 2024-02-02 DIAGNOSIS — J45909 Unspecified asthma, uncomplicated: Secondary | ICD-10-CM | POA: Diagnosis present

## 2024-02-02 DIAGNOSIS — E1140 Type 2 diabetes mellitus with diabetic neuropathy, unspecified: Secondary | ICD-10-CM | POA: Diagnosis present

## 2024-02-02 DIAGNOSIS — E11621 Type 2 diabetes mellitus with foot ulcer: Secondary | ICD-10-CM | POA: Diagnosis present

## 2024-02-02 DIAGNOSIS — E119 Type 2 diabetes mellitus without complications: Secondary | ICD-10-CM

## 2024-02-02 DIAGNOSIS — Z87891 Personal history of nicotine dependence: Secondary | ICD-10-CM

## 2024-02-02 DIAGNOSIS — Z89421 Acquired absence of other right toe(s): Secondary | ICD-10-CM

## 2024-02-02 DIAGNOSIS — Z89422 Acquired absence of other left toe(s): Secondary | ICD-10-CM

## 2024-02-02 DIAGNOSIS — Z8349 Family history of other endocrine, nutritional and metabolic diseases: Secondary | ICD-10-CM

## 2024-02-02 DIAGNOSIS — Z91018 Allergy to other foods: Secondary | ICD-10-CM

## 2024-02-02 LAB — CBC WITH DIFFERENTIAL/PLATELET
Abs Immature Granulocytes: 0.07 K/uL (ref 0.00–0.07)
Basophils Absolute: 0 K/uL (ref 0.0–0.1)
Basophils Relative: 0 %
Eosinophils Absolute: 0 K/uL (ref 0.0–0.5)
Eosinophils Relative: 0 %
HCT: 43.2 % (ref 39.0–52.0)
Hemoglobin: 15.3 g/dL (ref 13.0–17.0)
Immature Granulocytes: 1 %
Lymphocytes Relative: 14 %
Lymphs Abs: 1 K/uL (ref 0.7–4.0)
MCH: 30.9 pg (ref 26.0–34.0)
MCHC: 35.4 g/dL (ref 30.0–36.0)
MCV: 87.3 fL (ref 80.0–100.0)
Monocytes Absolute: 0.5 K/uL (ref 0.1–1.0)
Monocytes Relative: 6 %
Neutro Abs: 5.8 K/uL (ref 1.7–7.7)
Neutrophils Relative %: 79 %
Platelets: 177 K/uL (ref 150–400)
RBC: 4.95 MIL/uL (ref 4.22–5.81)
RDW: 13 % (ref 11.5–15.5)
WBC: 7.3 K/uL (ref 4.0–10.5)
nRBC: 0 % (ref 0.0–0.2)

## 2024-02-02 LAB — BASIC METABOLIC PANEL WITH GFR
Anion gap: 8 (ref 5–15)
BUN: 27 mg/dL — ABNORMAL HIGH (ref 8–23)
CO2: 24 mmol/L (ref 22–32)
Calcium: 9.1 mg/dL (ref 8.9–10.3)
Chloride: 105 mmol/L (ref 98–111)
Creatinine, Ser: 0.79 mg/dL (ref 0.61–1.24)
GFR, Estimated: >60 mL/min (ref >60)
Glucose, Bld: 124 mg/dL — ABNORMAL HIGH (ref 70–99)
Potassium: 4.3 mmol/L (ref 3.5–5.1)
Sodium: 137 mmol/L (ref 135–145)

## 2024-02-02 LAB — GLUCOSE, CAPILLARY: Glucose-Capillary: 160 mg/dL — ABNORMAL HIGH (ref 70–99)

## 2024-02-02 MED ORDER — AMLODIPINE-OLMESARTAN 5-40 MG PO TABS: 1.0000 | ORAL_TABLET | Freq: Every day | ORAL | Status: DC

## 2024-02-02 MED ORDER — INSULIN ASPART 100 UNIT/ML IJ SOLN
0.0000 [IU] | Freq: Three times a day (TID) | INTRAMUSCULAR | Status: DC
  Administered 2024-02-03 – 2024-02-05 (×5): 1 [IU] via SUBCUTANEOUS
  Administered 2024-02-06: 2 [IU] via SUBCUTANEOUS
  Administered 2024-02-06: 3 [IU] via SUBCUTANEOUS
  Administered 2024-02-07 (×2): 2 [IU] via SUBCUTANEOUS
  Administered 2024-02-07 – 2024-02-08 (×3): 1 [IU] via SUBCUTANEOUS
  Administered 2024-02-09: 3 [IU] via SUBCUTANEOUS
  Administered 2024-02-09 – 2024-02-11 (×3): 2 [IU] via SUBCUTANEOUS

## 2024-02-02 MED ORDER — HYDROMORPHONE HCL 1 MG/ML IJ SOLN: 1.0000 mg | Freq: Once | INTRAMUSCULAR | Status: DC

## 2024-02-02 MED ORDER — HYDROMORPHONE HCL 1 MG/ML IJ SOLN
1.0000 mg | Freq: Once | INTRAMUSCULAR | Status: AC
  Administered 2024-02-02: 1 mg via INTRAVENOUS
  Filled 2024-02-02: qty 1

## 2024-02-02 MED ORDER — NALOXONE HCL 0.4 MG/ML IJ SOLN: 0.4000 mg | INTRAMUSCULAR | Status: DC | PRN

## 2024-02-02 MED ORDER — HYDROMORPHONE HCL 1 MG/ML IJ SOLN
1.0000 mg | INTRAMUSCULAR | Status: DC | PRN
  Administered 2024-02-03 – 2024-02-06 (×12): 1 mg via INTRAVENOUS
  Filled 2024-02-02 (×12): qty 1

## 2024-02-02 MED ORDER — DEXAMETHASONE SODIUM PHOSPHATE 10 MG/ML IJ SOLN
10.0000 mg | Freq: Once | INTRAMUSCULAR | Status: AC
  Administered 2024-02-02: 10 mg via INTRAVENOUS
  Filled 2024-02-02: qty 1

## 2024-02-02 MED ORDER — METHOCARBAMOL 1000 MG/10ML IJ SOLN
1000.0000 mg | Freq: Once | INTRAMUSCULAR | Status: AC
  Administered 2024-02-02: 1000 mg via INTRAVENOUS
  Filled 2024-02-02: qty 10

## 2024-02-02 MED ORDER — DEXAMETHASONE SODIUM PHOSPHATE 10 MG/ML IJ SOLN
4.0000 mg | Freq: Four times a day (QID) | INTRAMUSCULAR | Status: DC
  Administered 2024-02-03 – 2024-02-11 (×34): 4 mg via INTRAVENOUS
  Filled 2024-02-02: qty 1
  Filled 2024-02-02: qty 0.4
  Filled 2024-02-02 (×19): qty 1
  Filled 2024-02-02 (×2): qty 0.4
  Filled 2024-02-02 (×6): qty 1
  Filled 2024-02-02: qty 0.4
  Filled 2024-02-02 (×4): qty 1

## 2024-02-02 MED ORDER — KETOROLAC TROMETHAMINE 30 MG/ML IJ SOLN
15.0000 mg | Freq: Once | INTRAMUSCULAR | Status: AC
  Administered 2024-02-02: 15 mg via INTRAVENOUS
  Filled 2024-02-02: qty 1

## 2024-02-02 MED ORDER — AMLODIPINE BESYLATE 5 MG PO TABS
5.0000 mg | ORAL_TABLET | Freq: Every day | ORAL | Status: DC
  Administered 2024-02-05 – 2024-02-06 (×2): 5 mg via ORAL
  Filled 2024-02-02 (×4): qty 1

## 2024-02-02 MED ORDER — DIAZEPAM 5 MG/ML IJ SOLN: 5.0000 mg | Freq: Once | INTRAMUSCULAR | Status: DC

## 2024-02-02 MED ORDER — ROSUVASTATIN CALCIUM 5 MG PO TABS
5.0000 mg | ORAL_TABLET | Freq: Every day | ORAL | Status: DC
  Administered 2024-02-03 – 2024-02-11 (×9): 5 mg via ORAL
  Filled 2024-02-02 (×9): qty 1

## 2024-02-02 MED ORDER — HYDROMORPHONE HCL 1 MG/ML IJ SOLN
2.0000 mg | Freq: Once | INTRAMUSCULAR | Status: AC
  Administered 2024-02-02: 2 mg via INTRAMUSCULAR
  Filled 2024-02-02: qty 2

## 2024-02-02 MED ORDER — INSULIN ASPART 100 UNIT/ML IJ SOLN: 0.0000 [IU] | Freq: Every day | INTRAMUSCULAR | Status: DC

## 2024-02-02 MED ORDER — METHOCARBAMOL 1000 MG/10ML IJ SOLN
500.0000 mg | Freq: Four times a day (QID) | INTRAMUSCULAR | Status: DC | PRN
Start: 2024-02-02 — End: 2024-02-06
  Administered 2024-02-03 – 2024-02-04 (×3): 500 mg via INTRAVENOUS
  Filled 2024-02-02 (×3): qty 10

## 2024-02-02 MED ORDER — OXYCODONE HCL 5 MG PO TABS
5.0000 mg | ORAL_TABLET | Freq: Four times a day (QID) | ORAL | Status: DC | PRN
  Administered 2024-02-03 – 2024-02-05 (×7): 5 mg via ORAL
  Filled 2024-02-02 (×9): qty 1

## 2024-02-02 MED ORDER — HYDROMORPHONE HCL 1 MG/ML IJ SOLN
1.0000 mg | INTRAMUSCULAR | Status: DC | PRN
  Administered 2024-02-02: 1 mg via INTRAVENOUS
  Filled 2024-02-02: qty 1

## 2024-02-02 MED ORDER — IRBESARTAN 300 MG PO TABS
300.0000 mg | ORAL_TABLET | Freq: Every day | ORAL | Status: DC
  Administered 2024-02-03 – 2024-02-06 (×3): 300 mg via ORAL
  Filled 2024-02-02 (×4): qty 1

## 2024-02-02 MED ORDER — DIAZEPAM 5 MG/ML IJ SOLN
5.0000 mg | Freq: Once | INTRAMUSCULAR | Status: AC
  Administered 2024-02-02: 5 mg via INTRAMUSCULAR
  Filled 2024-02-02: qty 2

## 2024-02-02 NOTE — H&P (Signed)
 History and Physical    Robert [name] FMW:[id number] DOB: 1956-05-29 DOA: 02/02/2024  PCP: [facility]  Patient coming from: Home  Chief Complaint: Back pain  HPI: Robert [name] is a 68 y.o. male with medical history significant of asthma, allergic rhinitis, chronic back pain, hypertension, hyperlipidemia, Barrett's esophagus, depression, type 2 diabetes, diabetic foot infections/osteomyelitis/toe amputations, OSA on CPAP presenting with complaints of severe low back pain with right-sided radicular symptoms x 5 days.  History of previous lumbar fusion of L4-5.  Patient denies any recent falls or injury to his back.  Denies fevers or chills.  Denies lower extremity weakness or symptoms of bowel or bladder dysfunction.  He is having difficulty ambulating due to severe pain.  States he was recently seen by orthopedics and had an MRI done 2 days ago.       ED Course: Neurosurgery evaluated the patient in the ED and felt that he had a normal neurologic exam with no focal motor deficits and no evidence of cauda equina, therefore, no indication for emergent surgery.  They recommended admission for IV steroids and pain management.  Vital signs stable.  Labs showing no leukocytosis.  Patient was given IV Decadron  10 mg, Valium , Dilaudid , Toradol , and Robaxin  in the ED.   Review of Systems:  Review of Systems  All other systems reviewed and are negative.   Past Medical History:  Diagnosis Date   Allergic rhinitis due to pollen    Arthritis    Asthma    Back pain    Barrett esophagus    Depressive disorder    Diabetes mellitus type 2 in obese    Exposure to hepatitis B    Exposure to hepatitis C    Narcotic abuse (HCC)    pain medications   OSA (obstructive sleep apnea)    on CPAP     Past Surgical History:  Procedure Laterality Date   ABDOMINAL SURGERY     AMPUTATION Right 06/11/2018   Procedure: RIGHT 2ND RAY AMPUTATION;  Surgeon: Harden Jerona GAILS, MD;   Location: [facility] OR;  Service: Orthopedics;  Laterality: Right;   AMPUTATION TOE Left 06/02/2022   Procedure: LEFT FOURTH PARTIAL TOE AMPUTATION;  Surgeon: Silva Juliene SAUNDERS, DPM;  Location: [facility];  Service: Podiatry;  Laterality: Left;   AMPUTATION TOE Right 04/11/2023   Procedure: AMPUTATION TOE;  Surgeon: Malvin Marsa FALCON, DPM;  Location: [facility] OR;  Service: Orthopedics/Podiatry;  Laterality: Right;  Right 3rd toe amputation   BACK SURGERY     Rods and screws in lumbar area   COLONOSCOPY     ESOPHAGOGASTRODUODENOSCOPY  multiple   HERNIA REPAIR     6   TOTAL KNEE ARTHROPLASTY  2009   left     reports that he quit smoking about 19 years ago. His smoking use included cigarettes. He started smoking about 34 years ago. He has a 12 pack-year smoking history. He has never used smokeless tobacco. He reports that he does not drink alcohol and does not use drugs.  Allergies  Allergen Reactions   Onion Other (See Comments)    Stomach Irritation    Latex Rash    With prolonged contact    Family History  Problem Relation Age of Onset   Allergies Mother    Early death Father    Thyroid  disease Sister    Colon cancer Neg Hx    Stomach cancer Neg Hx     Prior to Admission medications   Medication  Sig Start Date End Date Taking? Authorizing Provider  amLODipine -olmesartan  (AZOR ) 5-40 MG tablet Take 1 tablet by mouth daily. 11/22/21  Yes [provider]  APPLE CIDER VINEGAR PO Take 15 mLs by mouth 2 (two) times daily.    Yes [provider]  B Complex Vitamins (B COMPLEX 100 PO) Take 1 capsule by mouth daily.   Yes [provider]  CALCIUM -MAGNESIUM -ZINC PO Take 1 tablet by mouth every other day.   Yes [provider]  Cholecalciferol  (VITAMIN D3) 5000 units TABS 5,000 IU OTC vitamin D3 daily. 05/08/16  Yes Opalski, Deborah, DO  FARXIGA  10 MG TABS tablet Take 10 mg by mouth daily. 11/03/21  Yes [provider]  furosemide (LASIX) 40 MG tablet TAKE  1/2-1 TABLET BY MOUTH 1 TIME A DAY AS NEEDED FLUID. FOR OCCASIONAL USE.   Yes [provider]  GINSENG PO Take 1 tablet by mouth 2 (two) times a week.   Yes [provider]  meloxicam  (MOBIC ) 15 MG tablet TAKE 1 TABLET BY MOUTH EVERY DAY FOR 30 DAYS   Yes [provider]  Potassium 99 MG TABS Take 2 tablets by mouth in the morning and at bedtime.   Yes [provider]  rosuvastatin  (CRESTOR ) 5 MG tablet Take 1 tablet (5 mg total) by mouth daily. 04/13/23  Yes Fernand Prost, MD  tirzepatide [facility]) 15 MG/0.5ML Pen Inject 15 mg into the skin once a week.   Yes [provider]  traMADol  (ULTRAM ) 50 MG tablet Take 100 mg by mouth 4 (four) times daily. 01/14/24  Yes [provider]  vitamin C (ASCORBIC ACID) 500 MG tablet Take 500 mg by mouth daily.   Yes [provider]  VITAMIN E PO Take 1 tablet by mouth daily.   Yes [provider]  blood glucose meter kit and supplies Dispense based on patient and insurance preference. Use to check glucose fasting in the AM and then after largest meal of the day.. (FOR ICD-10 E10.9, E11.9). 12/04/17   Opalski, Barnie, DO  Blood Glucose Monitoring Suppl (ONE TOUCH ULTRA MINI) w/Device KIT Use to test blood sugar, test in the morning fasting and test after largest meal of the day. 05/28/17   Opalski, Deborah, DO  cyclobenzaprine  (FLEXERIL ) 10 MG tablet TAKE 1/2 TO 1 TABLET BY MOUTH EVERY 8 HOURS AS NEEDED FOR MUSCLE SPASM. MAY CAUSE SEDATION.    [provider]  glucose blood (ONE TOUCH ULTRA TEST) test strip Use to test blood sugar, test in the morning fasting and test after largest meal of the day. 05/28/17   Midge Barnie, DO  NARCAN  4 MG/0.1ML LIQD nasal spray kit Place 1 spray into the nose daily as needed (overdose). Patient not taking: Reported on 02/02/2024 08/19/18   [provider]  AISHA PASTOR LANCETS 33G MISC Use for testing blood sugar, test once in the morning  fasting and test after largest meal of the day. 05/28/17   Opalski, Deborah, DO  valACYclovir  (VALTREX ) 1000 MG tablet TAKE 1 TABLET BY MOUTH DAILY AS NEEDED (FOR OUTBREAKS ONLY). Patient not taking: Reported on 02/02/2024 03/06/21   Blenda Headings, PA-C    Physical Exam: Vitals:   02/02/24 1425 02/02/24 1429 02/02/24 2147 02/02/24 2219  BP: 108/68  113/70 136/78  Pulse: 61  67 63  Resp:   18 16  Temp: 98 F (36.7 C)  98.2 F (36.8 C) 98.5 F (36.9 C)  TempSrc: Oral   Oral  SpO2: 99%  99%  95%  Weight:  (!) 142.4 kg    Height:  5' 11 (1.803 m)      Physical Exam Vitals reviewed.  Constitutional:      General: He is not in acute distress. HENT:     Head: Normocephalic and atraumatic.  Eyes:     Extraocular Movements: Extraocular movements intact.  Cardiovascular:     Rate and Rhythm: Normal rate and regular rhythm.     Pulses: Normal pulses.  Pulmonary:     Effort: Pulmonary effort is normal. No respiratory distress.     Breath sounds: Normal breath sounds. No wheezing or rales.  Abdominal:     General: Bowel sounds are normal. There is no distension.     Palpations: Abdomen is soft.     Tenderness: There is no abdominal tenderness. There is no guarding.  Musculoskeletal:     Cervical back: Normal range of motion.     Right lower leg: No edema.     Left lower leg: No edema.  Skin:    General: Skin is warm and dry.  Neurological:     General: No focal deficit present.     Mental Status: He is alert and oriented to person, place, and time.     Cranial Nerves: No cranial nerve deficit.     Sensory: No sensory deficit.     Motor: No weakness.     Labs on Admission: I have personally reviewed following labs and imaging studies  CBC: Recent Labs  Lab 02/02/24 1520  WBC 7.3  NEUTROABS 5.8  HGB 15.3  HCT 43.2  MCV 87.3  PLT 177   Basic Metabolic Panel: Recent Labs  Lab 02/02/24 1520  NA 137  K 4.3  CL 105  CO2 24  GLUCOSE 124*  BUN 27*  CREATININE  0.79  CALCIUM  9.1   GFR: Estimated Creatinine Clearance: 125.9 mL/min (by C-G formula based on SCr of 0.79 mg/dL). Liver Function Tests: No results for input(s): AST, ALT, ALKPHOS, BILITOT, PROT, ALBUMIN in the last 168 hours. No results for input(s): LIPASE, AMYLASE in the last 168 hours. No results for input(s): AMMONIA in the last 168 hours. Coagulation Profile: No results for input(s): INR, PROTIME in the last 168 hours. Cardiac Enzymes: No results for input(s): CKTOTAL, CKMB, CKMBINDEX, TROPONINI in the last 168 hours. BNP (last 3 results) No results for input(s): PROBNP in the last 8760 hours. HbA1C: No results for input(s): HGBA1C in the last 72 hours. CBG: Recent Labs  Lab 02/02/24 2222  GLUCAP 160*   Lipid Profile: No results for input(s): CHOL, HDL, LDLCALC, TRIG, CHOLHDL, LDLDIRECT in the last 72 hours. Thyroid  Function Tests: No results for input(s): TSH, T4TOTAL, FREET4, T3FREE, THYROIDAB in the last 72 hours. Anemia Panel: No results for input(s): VITAMINB12, FOLATE, FERRITIN, TIBC, IRON, RETICCTPCT in the last 72 hours. Urine analysis:    Component Value Date/Time   COLORURINE YELLOW 03/15/2018 0235   APPEARANCEUR CLEAR 03/15/2018 0235   LABSPEC 1.021 03/15/2018 0235   PHURINE 5.0 03/15/2018 0235   GLUCOSEU NEGATIVE 03/15/2018 0235   HGBUR NEGATIVE 03/15/2018 0235   BILIRUBINUR NEGATIVE 03/15/2018 0235   KETONESUR NEGATIVE 03/15/2018 0235   PROTEINUR NEGATIVE 03/15/2018 0235   UROBILINOGEN 0.2 05/31/2012 1658   NITRITE NEGATIVE 03/15/2018 0235   LEUKOCYTESUR NEGATIVE 03/15/2018 0235    Radiological Exams on Admission: No results found.  Assessment and Plan  Severe low back pain with right-sided radicular symptoms No fever or leukocytosis.  Had an MRI done 2  days ago and findings listed in HPI.  Neurosurgery evaluated the patient and felt that he had a normal neurologic exam with no  focal motor deficits and no evidence of cauda equina, therefore, no indication for emergent surgery.  Recommended continuing IV steroids and pain management.  Asthma Stable, no signs of acute exacerbation.  Not on inhalers at home.  Hypertension Stable.  Continue amlodipine  and olmesartan .  Hyperlipidemia Continue Crestor .  Type 2 diabetes Last A1c 5.4 in September 2024, repeat ordered.  Placed on sensitive sliding scale insulin .  OSA Continue CPAP at night.  DVT prophylaxis: SCDs Code Status: Full Code (discussed with the patient) Level of care: Med-Surg Admission status: It is my clinical opinion that referral for OBSERVATION is reasonable and necessary in this patient based on the above information provided. The aforementioned taken together are felt to place the patient at high risk for further clinical deterioration. However, it is anticipated that the patient may be medically stable for discharge from the hospital within 24 to 48 hours.  Editha Ram MD [facility]  If 7PM-7AM, please contact night-coverage [email or web address]  02/02/2024, 11:28 PM

## 2024-02-02 NOTE — ED Notes (Signed)
 Called [facility] and let them know PT was on their way up.

## 2024-02-02 NOTE — ED Provider Notes (Signed)
 [facility] EMERGENCY DEPARTMENT AT [facility] Provider Note   CSN: [id number] Arrival date & time: 02/02/24  1417     Patient presents with: Back Pain (Referred by [facility])   Robert [name] is a 67 y.o. male.   Patient to ED from the office of Charlie Dolores with [facility]. He has been experiencing severe low back pain with right sided radicular symptoms for the past 5 days. History of previous lumbar fusion of L4-5 ([place], [place]) and reports some level of intermittently chronic back pain. For the past 5 days, his pain has been severe, limiting his mobility and overall function. No bowel/bladder dysfunction. The patient presents with results of MRI done 01/31/24 showing multi-level stenosis.   The history is provided by the patient. No language interpreter was used.  Back Pain      Prior to Admission medications   Medication Sig Start Date End Date Taking? Authorizing Provider  albuterol  (VENTOLIN  HFA) 108 (90 Base) MCG/ACT inhaler INHALE 1-2 PUFFS BY MOUTH EVERY 6 HOURS AS NEEDED FOR WHEEZE OR SHORTNESS OF BREATH. 09/21/21   Abonza, Maritza, PA-C  amLODipine -olmesartan  (AZOR ) 5-40 MG tablet Take 1 tablet by mouth daily. 11/22/21   [provider]  APPLE CIDER VINEGAR PO Take 15 mLs by mouth 2 (two) times daily.     [provider]  aspirin  EC 81 MG tablet Take 1 tablet (81 mg total) by mouth daily. Swallow whole. 04/12/23   Fernand Prost, MD  B Complex Vitamins (B COMPLEX 100 PO) Take 1 capsule by mouth daily.    [provider]  blood glucose meter kit and supplies Dispense based on patient and insurance preference. Use to check glucose fasting in the AM and then after largest meal of the day.. (FOR ICD-10 E10.9, E11.9). 12/04/17   Opalski, Barnie, DO  Blood Glucose Monitoring Suppl (ONE TOUCH ULTRA MINI) w/Device KIT Use to test blood sugar, test in the morning fasting and test after largest meal of the day. 05/28/17   Opalski, Deborah,  DO  CALCIUM -MAGNESIUM -ZINC PO Take 1 tablet by mouth every other day.    [provider]  Cholecalciferol  (VITAMIN D3) 5000 units TABS 5,000 IU OTC vitamin D3 daily. 05/08/16   Opalski, Deborah, DO  FARXIGA  10 MG TABS tablet Take 10 mg by mouth daily. 11/03/21   [provider]  GINSENG PO Take 1 tablet by mouth once a week.    [provider]  glucose blood (ONE TOUCH ULTRA TEST) test strip Use to test blood sugar, test in the morning fasting and test after largest meal of the day. 05/28/17   Opalski, Barnie, DO  NARCAN  4 MG/0.1ML LIQD nasal spray kit Place 1 spray into the nose daily as needed (overdose). 08/19/18   [provider]  ondansetron  (ZOFRAN ) 4 MG tablet Take 1 tablet (4 mg total) by mouth every 8 (eight) hours as needed for nausea or vomiting. 12/26/21   Joya Stabs, DPM  ONETOUCH DELICA LANCETS 33G MISC Use for testing blood sugar, test once in the morning fasting and test after largest meal of the day. 05/28/17   Opalski, Barnie, DO  polyethylene glycol (MIRALAX  / GLYCOLAX ) 17 g packet Take 17 g by mouth daily as needed. 06/04/22   Shalhoub, Zachary PARAS, MD  Potassium 99 MG TABS Take 99 mg by mouth daily.    [provider]  rosuvastatin  (CRESTOR ) 5 MG tablet Take 1 tablet (5 mg total) by mouth daily. 04/13/23   Fernand Prost,  MD  tirzepatide (MOUNJARO) 15 MG/0.5ML Pen Inject 15 mg into the skin once a week.    [provider]  TURMERIC CURCUMIN PO Take 1 tablet by mouth every other day.    [provider]  valACYclovir  (VALTREX ) 1000 MG tablet TAKE 1 TABLET BY MOUTH DAILY AS NEEDED (FOR OUTBREAKS ONLY). 03/06/21   Abonza, Maritza, PA-C  vitamin B-12 1000 MCG tablet Take 1 tablet (1,000 mcg total) by mouth daily. 04/10/18   Sheikh, Omair Latif, DO  vitamin C (ASCORBIC ACID) 500 MG tablet Take 500 mg by mouth daily.    [provider]  VITAMIN E PO Take 1 tablet by mouth daily.    [provider]     Allergies: Onion    Review of Systems  Musculoskeletal:  Positive for back pain.    Updated Vital Signs BP 108/68 (BP Location: Right Arm)   Pulse 61   Temp 98 F (36.7 C) (Oral)   Ht 5' 11 (1.803 m)   Wt (!) 142.4 kg   SpO2 99%   BMI 43.79 kg/m   Physical Exam Vitals and nursing note reviewed.     (all labs ordered are listed, but only abnormal results are displayed) Labs Reviewed  BASIC METABOLIC PANEL WITH GFR - Abnormal; Notable for the following components:      Result Value   Glucose, Bld 124 (*)    BUN 27 (*)    All other components within normal limits  CBC WITH DIFFERENTIAL/PLATELET    EKG: None  Radiology: No results found.   Procedures   Medications Ordered in the ED  HYDROmorphone  (DILAUDID ) injection 2 mg (2 mg Intramuscular Given 02/02/24 1559)  diazepam  (VALIUM ) injection 5 mg (5 mg Intramuscular Given 02/02/24 1558)  HYDROmorphone  (DILAUDID ) injection 1 mg (1 mg Intravenous Given 02/02/24 1726)  dexamethasone  (DECADRON ) injection 10 mg (10 mg Intravenous Given 02/02/24 1726)  methocarbamol  (ROBAXIN ) injection 1,000 mg (1,000 mg Intravenous Given 02/02/24 1727)  ketorolac  (TORADOL ) 30 MG/ML injection 15 mg (15 mg Intravenous Given 02/02/24 1930)  HYDROmorphone  (DILAUDID ) injection 1 mg (1 mg Intravenous Given 02/02/24 1930)                                    Medical Decision Making This patient presents to the ED for concern of back pain, this involves an extensive number of treatment options, and is a complaint that carries with it a high risk of complications and morbidity.  The differential diagnosis includes radiculopathy, cauda equina, epidural abscess, infection,    Co morbidities that complicate the patient evaluation  Arthritis, asthma, OSA, T2DM, lumbar fusion, chronic back pain  Additional history obtained:  Additional history and/or information obtained from chart review, notable for MRI results sent with patient: 1.  Postsurgical changes of lumbar spine fusion of L4-5 and multilevel degenerative changes of the lumbar spine... 2. L3-4, Grade 1 anterolisthesis. Small right foraminal/far lateral superior disc extrusion, encroaching upon the exting right L3 nerve root. Severe central stenosis. Mild to moderate biforaminal stenosis 3. L5-S1, small to moderate central to right posterolateral disc protrusion and annular fissure, encroaching upon the traversing subarticular zone stenosis. Mild biforaminal stenosis.  4. L2-3, mild retrolisthesis. Severe effacement of the thecal sac. Mild to moderate biforaminal stenosis 5. L1-2, mild retrolisthesis. Moderate effacement of the thecal sac. Mild to moderate left and mild right foraminal stenosis. 6. L4-5 post surgical laminar changes. Mild to moderate  right subarticular zone stenosis. Mild biforaminal stenosis.   Lab Tests:  I Ordered, and personally interpreted labs.  The pertinent results include:   CBC Bmet    Imaging Studies ordered:  I ordered imaging studies including n/a I independently visualized and interpreted imaging which showed n/a Patient arrives with MRI results in hand with disc   Cardiac Monitoring:  The patient was maintained on a cardiac monitor.  I personally viewed and interpreted the cardiac monitored which showed an underlying rhythm of: n/a   Medicines ordered and prescription drug management:  I ordered medication including dialudid, valium   for pain Reevaluation of the patient after these medicines showed that the patient improved I have reviewed the patients home medicines and have made adjustments as needed   Test Considered:  N/a   Critical Interventions:  N/a   Consultations Obtained:  I requested consultation with the neurosurgery, Dr. Onetha,  and discussed lab and imaging findings as well as pertinent plan - they recommend: IV decadron  10 mg, Robaxin  1000 mg IV. Review of MRI verbally with Dr. Onetha did not identify  any emergent indication for surgery. If admitted for pain control, he will provide consultation.  5:50 - Dr. Onetha has been to the ED and evaluated the patient.   Attempt to upload the MRI results brought to ED on Disc from Dr. Bonner for direct viewing of consultants unsuccessful.  Problem List / ED Course:  Here with severe low back and right leg pain x 5 days.  Pain medications ordered - IV access delayed due to hard stick, pending IV team. Comfort medications changed to IM.  IV medications provided - Decadron , Robaxin , Dilaudid   - improved but ambulation remains limited Additional dilaudid , toradol  provided. Some relief but standing remains difficult Will discuss admission with the hospitalist.    Reevaluation:  After the interventions noted above, I reevaluated the patient and found that they have :improved   Social Determinants of Health:  Former smoker   Disposition:  After consideration of the diagnostic results and the patients response to treatment, I feel that the patient would benefit from admit.   Amount and/or Complexity of Data Reviewed Labs: ordered.  Risk Prescription drug management. Decision regarding hospitalization.        Final diagnoses:  Acute right-sided low back pain with right-sided sciatica  Intractable back pain    ED Discharge Orders     None          Odell Margit RIGGERS 02/02/24 2056    Randol Simmonds, MD 02/03/24 1058

## 2024-02-02 NOTE — Consult Note (Signed)
 Reason for Consult: Back and right leg pain Referring Physician: Emergency department  Robert [name] is an 68 y.o. male.  HPI: 68 year old gentleman with longstanding issues with his back about 13 years ago underwent an L4-5 fusion starting on Monday*experiencing severe back and right hip and leg pain posterior thigh hamstrings posterior lateral shin calf and bottom of his foot.  All this was on the right did not have left leg symptoms denies any bowel bladder complaints except for pain difficulty ambulating.  Initially went and saw his primary care was referred to [facility] where an MRI scan was placed on prednisone  2 different rounds with no improvement in his pain.  Patient does report history of questionable borderline diabetes high blood pressure no other cardiopulmonary history.  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  Past Medical History:  Diagnosis Date   Allergic rhinitis due to pollen    Arthritis    Asthma    Back pain    Barrett esophagus    Depressive disorder    Diabetes mellitus type 2 in obese    Exposure to hepatitis B    Exposure to hepatitis C    Narcotic abuse (HCC)    pain medications   OSA (obstructive sleep apnea)    on CPAP     Past Surgical History:  Procedure Laterality Date   ABDOMINAL SURGERY     AMPUTATION Right 06/11/2018   Procedure: RIGHT 2ND RAY AMPUTATION;  Surgeon: Harden Jerona GAILS, MD;  Location: [facility] OR;  Service: Orthopedics;  Laterality: Right;   AMPUTATION TOE Left 06/02/2022   Procedure: LEFT FOURTH PARTIAL TOE AMPUTATION;  Surgeon: Silva Juliene SAUNDERS, DPM;  Location: [facility] ORS;  Service: Podiatry;  Laterality: Left;   AMPUTATION TOE Right 04/11/2023   Procedure: AMPUTATION TOE;  Surgeon: Malvin Marsa FALCON, DPM;  Location: [facility] OR;  Service: Orthopedics/Podiatry;  Laterality: Right;  Right 3rd toe amputation   BACK SURGERY     Rods and screws in lumbar area   COLONOSCOPY     ESOPHAGOGASTRODUODENOSCOPY  multiple   HERNIA REPAIR     6   TOTAL  KNEE ARTHROPLASTY  2009   left    Family History  Problem Relation Age of Onset   Allergies Mother    Early death Father    Thyroid  disease Sister    Colon cancer Neg Hx    Stomach cancer Neg Hx     Social History:  reports that he quit smoking about 19 years ago. His smoking use included cigarettes. He started smoking about 34 years ago. He has a 12 pack-year smoking history. He has never used smokeless tobacco. He reports that he does not drink alcohol and does not use drugs.  Allergies:  Allergies  Allergen Reactions   Onion Other (See Comments)    Stomach Irritation     Medications: I have reviewed the patient's current medications.  Results for orders placed or performed during the hospital encounter of 02/02/24 (from the past 48 hours)  CBC with Differential     Status: None   Collection Time: 02/02/24  3:20 PM  Result Value Ref Range   WBC 7.3 4.0 - 10.5 K/uL   RBC 4.95 4.22 - 5.81 MIL/uL   Hemoglobin 15.3 13.0 - 17.0 g/dL   HCT 56.7 60.9 - 47.9 %   MCV 87.3 80.0 - 100.0 fL   MCH 30.9 26.0 - 34.0 pg   MCHC 35.4 30.0 - 36.0 g/dL   RDW 86.9 88.4 - 84.4 %  Platelets 177 150 - 400 K/uL   nRBC 0.0 0.0 - 0.2 %   Neutrophils Relative % 79 %   Neutro Abs 5.8 1.7 - 7.7 K/uL   Lymphocytes Relative 14 %   Lymphs Abs 1.0 0.7 - 4.0 K/uL   Monocytes Relative 6 %   Monocytes Absolute 0.5 0.1 - 1.0 K/uL   Eosinophils Relative 0 %   Eosinophils Absolute 0.0 0.0 - 0.5 K/uL   Basophils Relative 0 %   Basophils Absolute 0.0 0.0 - 0.1 K/uL   Immature Granulocytes 1 %   Abs Immature Granulocytes 0.07 0.00 - 0.07 K/uL    Comment: Performed at [facility], [address] [place], [place] [address]  Basic metabolic panel     Status: Abnormal   Collection Time: 02/02/24  3:20 PM  Result Value Ref Range   Sodium 137 135 - 145 mmol/L   Potassium 4.3 3.5 - 5.1 mmol/L   Chloride 105 98 - 111 mmol/L   CO2 24 22 - 32 mmol/L   Glucose, Bld 124 (H) 70 - 99 mg/dL    Comment:  Glucose reference range applies only to samples taken after fasting for at least 8 hours.   BUN 27 (H) 8 - 23 mg/dL   Creatinine, Ser 9.20 0.61 - 1.24 mg/dL   Calcium  9.1 8.9 - 10.3 mg/dL   GFR, Estimated >39 >39 mL/min    Comment: (NOTE) Calculated using the CKD-EPI Creatinine Equation (2021)    Anion gap 8 5 - 15    Comment: Performed at [facility], [address], [place], [place] [address]    No results found.  Review of Systems  Musculoskeletal:  Positive for back pain.  Neurological:  Positive for numbness.   Blood pressure 108/68, pulse 61, temperature 98 F (36.7 C), temperature source Oral, height 5' 11 (1.803 m), weight (!) 142.4 kg, SpO2 99%. Physical Exam HENT:     Head: Normocephalic.     Right Ear: Tympanic membrane normal.     Nose: Nose normal.     Mouth/Throat:     Mouth: Mucous membranes are moist.  Cardiovascular:     Rate and Rhythm: Normal rate.     Pulses: Normal pulses.  Pulmonary:     Effort: Pulmonary effort is normal.  Musculoskeletal:     Cervical back: Normal range of motion.  Neurological:     Mental Status: He is alert.     Comments: Strength is 5 out of 5 iliopsoas, quads, hamstrings, gastroc, tibialis, EHL.     Assessment/Plan: 68 year old gentleman with back and right hip and leg pain consistent with what sounds like L5 or S1 has imaging images are not available report was available showed multilevel spondylosis and spondylolisthesis L3-4 foraminal disc at L3-4 and some mild spondylosis at L5-S1 nothing that looked emergent on the report but the images are being attempted to be uploaded so we can review them unable to even pull them up on the computer at this point.  So we have recommended IV steroids IV Robaxin  pain management see how that goes possible admission if the patient does not come under good pain control while in the ER and review images when available.  He is given normal neurologic exam no focal motor deficits no  evidence of cauda equina.  So therefore no evidence or indication for emergent surgery  Robert [name] 02/02/2024, 5:49 PM

## 2024-02-02 NOTE — ED Notes (Signed)
 Pt provided with diet coke with provider ok

## 2024-02-02 NOTE — ED Triage Notes (Addendum)
 Pain x 5 days. No new injury. No change in bowel or bladder habits pain radiates down right leg. MRI confirmed spinal stenosis with fusions. Patient unable to sit up due to pain.   VS PTA 102/54 63 18 95% RA CBG 103

## 2024-02-02 NOTE — ED Notes (Addendum)
 Pt wife very irritated. Demanding physician we are just not priority I guess

## 2024-02-02 NOTE — ED Notes (Signed)
 Patient's wife brought to hallway bedside. Demanding to see physician

## 2024-02-03 DIAGNOSIS — L97529 Non-pressure chronic ulcer of other part of left foot with unspecified severity: Secondary | ICD-10-CM | POA: Diagnosis present

## 2024-02-03 DIAGNOSIS — E871 Hypo-osmolality and hyponatremia: Secondary | ICD-10-CM | POA: Diagnosis present

## 2024-02-03 DIAGNOSIS — F1110 Opioid abuse, uncomplicated: Secondary | ICD-10-CM | POA: Diagnosis present

## 2024-02-03 DIAGNOSIS — I10 Essential (primary) hypertension: Secondary | ICD-10-CM | POA: Diagnosis present

## 2024-02-03 DIAGNOSIS — G4733 Obstructive sleep apnea (adult) (pediatric): Secondary | ICD-10-CM | POA: Diagnosis present

## 2024-02-03 DIAGNOSIS — M48061 Spinal stenosis, lumbar region without neurogenic claudication: Secondary | ICD-10-CM | POA: Diagnosis present

## 2024-02-03 DIAGNOSIS — Z7985 Long-term (current) use of injectable non-insulin antidiabetic drugs: Secondary | ICD-10-CM | POA: Diagnosis not present

## 2024-02-03 DIAGNOSIS — Z79899 Other long term (current) drug therapy: Secondary | ICD-10-CM | POA: Diagnosis not present

## 2024-02-03 DIAGNOSIS — G8929 Other chronic pain: Secondary | ICD-10-CM | POA: Diagnosis present

## 2024-02-03 DIAGNOSIS — E11621 Type 2 diabetes mellitus with foot ulcer: Secondary | ICD-10-CM | POA: Diagnosis present

## 2024-02-03 DIAGNOSIS — M545 Low back pain, unspecified: Secondary | ICD-10-CM | POA: Diagnosis present

## 2024-02-03 DIAGNOSIS — G629 Polyneuropathy, unspecified: Secondary | ICD-10-CM | POA: Diagnosis present

## 2024-02-03 DIAGNOSIS — K5900 Constipation, unspecified: Secondary | ICD-10-CM | POA: Diagnosis present

## 2024-02-03 DIAGNOSIS — J45909 Unspecified asthma, uncomplicated: Secondary | ICD-10-CM | POA: Diagnosis present

## 2024-02-03 DIAGNOSIS — E1140 Type 2 diabetes mellitus with diabetic neuropathy, unspecified: Secondary | ICD-10-CM | POA: Diagnosis present

## 2024-02-03 DIAGNOSIS — Z981 Arthrodesis status: Secondary | ICD-10-CM | POA: Diagnosis not present

## 2024-02-03 DIAGNOSIS — Z87891 Personal history of nicotine dependence: Secondary | ICD-10-CM | POA: Diagnosis not present

## 2024-02-03 DIAGNOSIS — Z91199 Patient's noncompliance with other medical treatment and regimen due to unspecified reason: Secondary | ICD-10-CM | POA: Diagnosis not present

## 2024-02-03 DIAGNOSIS — K2270 Barrett's esophagus without dysplasia: Secondary | ICD-10-CM | POA: Diagnosis present

## 2024-02-03 DIAGNOSIS — E66813 Obesity, class 3: Secondary | ICD-10-CM | POA: Diagnosis present

## 2024-02-03 DIAGNOSIS — M1712 Unilateral primary osteoarthritis, left knee: Secondary | ICD-10-CM | POA: Diagnosis present

## 2024-02-03 DIAGNOSIS — Z6841 Body Mass Index (BMI) 40.0 and over, adult: Secondary | ICD-10-CM | POA: Diagnosis not present

## 2024-02-03 DIAGNOSIS — I959 Hypotension, unspecified: Secondary | ICD-10-CM | POA: Diagnosis present

## 2024-02-03 DIAGNOSIS — Z7982 Long term (current) use of aspirin: Secondary | ICD-10-CM | POA: Diagnosis not present

## 2024-02-03 DIAGNOSIS — E785 Hyperlipidemia, unspecified: Secondary | ICD-10-CM | POA: Diagnosis present

## 2024-02-03 LAB — HEMOGLOBIN A1C
Hgb A1c MFr Bld: 4.8 % (ref 4.8–5.6)
Mean Plasma Glucose: 91.06 mg/dL

## 2024-02-03 LAB — GLUCOSE, CAPILLARY
Glucose-Capillary: 129 mg/dL — ABNORMAL HIGH (ref 70–99)
Glucose-Capillary: 137 mg/dL — ABNORMAL HIGH (ref 70–99)
Glucose-Capillary: 115 mg/dL — ABNORMAL HIGH (ref 70–99)
Glucose-Capillary: 153 mg/dL — ABNORMAL HIGH (ref 70–99)

## 2024-02-03 LAB — HIV ANTIBODY (ROUTINE TESTING W REFLEX): HIV Screen 4th Generation wRfx: NONREACTIVE

## 2024-02-03 MED ORDER — MELATONIN 5 MG PO TABS
10.0000 mg | ORAL_TABLET | Freq: Every evening | ORAL | Status: DC | PRN
  Administered 2024-02-03 – 2024-02-08 (×2): 10 mg via ORAL
  Filled 2024-02-03 (×4): qty 2

## 2024-02-03 MED ORDER — SODIUM CHLORIDE 0.9 % IV BOLUS
1000.0000 mL | Freq: Once | INTRAVENOUS | Status: AC
  Administered 2024-02-03: 1000 mL via INTRAVENOUS

## 2024-02-03 MED ORDER — SODIUM CHLORIDE 0.9 % IV SOLN: INTRAVENOUS | Status: DC

## 2024-02-03 MED ORDER — POLYETHYLENE GLYCOL 3350 17 G PO PACK
17.0000 g | PACK | Freq: Every day | ORAL | Status: AC
  Administered 2024-02-03 – 2024-02-05 (×3): 17 g via ORAL
  Filled 2024-02-03 (×4): qty 1

## 2024-02-03 MED ORDER — DOCUSATE SODIUM 100 MG PO CAPS
100.0000 mg | ORAL_CAPSULE | Freq: Two times a day (BID) | ORAL | Status: DC
  Administered 2024-02-03 – 2024-02-11 (×17): 100 mg via ORAL
  Filled 2024-02-03 (×17): qty 1

## 2024-02-03 NOTE — Plan of Care (Signed)
   Problem: Education: Goal: Knowledge of General Education information will improve Description Including pain rating scale, medication(s)/side effects and non-pharmacologic comfort measures Outcome: Progressing

## 2024-02-03 NOTE — Plan of Care (Signed)
  Problem: Education: Goal: Knowledge of General Education information will improve Description: Including pain rating scale, medication(s)/side effects and non-pharmacologic comfort measures Outcome: Progressing   Problem: Clinical Measurements: Goal: Ability to maintain clinical measurements within normal limits will improve Outcome: Progressing Goal: Will remain free from infection Outcome: Progressing   Problem: Activity: Goal: Risk for activity intolerance will decrease Outcome: Progressing   Problem: Nutrition: Goal: Adequate nutrition will be maintained Outcome: Progressing   Problem: Pain Managment: Goal: General experience of comfort will improve and/or be controlled Outcome: Progressing   Problem: Safety: Goal: Ability to remain free from injury will improve Outcome: Progressing   Problem: Skin Integrity: Goal: Risk for impaired skin integrity will decrease Outcome: Progressing   Problem: Coping: Goal: Ability to adjust to condition or change in health will improve Outcome: Progressing

## 2024-02-03 NOTE — Consult Note (Signed)
 WOC Nurse Consult Note: patient is followed by podiatry for L great toe diabetic ulcer; last seen 01/24/2024 with orders for daily Betadine wet to dry dressings  Reason for Consult: L great toe wound  Wound type: full thickness L great toe r/t diabetes  Pressure Injury POA: NA  Measurement: see nursing flowsheet; per podiatry note 01/24/2024 0.5 cm x 0.3 cm x 0.2 cm  Wound bed: dark hemorrhagic tissue covering  Drainage (amount, consistency, odor) see nursing flowsheet  Periwound: callusing  Dressing procedure/placement/frequency: Cleanse L great toe with Betadine, apply Betadine moistened gauze to wound bed daily, cover with dry gauze and secure with silicone foam or gauze and tape whichever is preferred.   POC discussed with bedside nurse. WOC team will not follow. Patient has follow-up scheduled with podiatry 02/14/2024 and should continue care with them.  Thank you,    Powell Bar MSN, RN-BC, [employer]

## 2024-02-03 NOTE — Progress Notes (Signed)
 Patient ID: Robert [name], male   DOB: July 02, 1956, 68 y.o.   MRN: [id number] Patient seen on rounds around lunchtime more comfortable today pain under better control radiology was not able to upload the films I took the CD with me to see if I can get them uploaded or taken to the office I did speak to the patient and his wife.  Neurologically stable recommend continued with physical and occupational therapy.

## 2024-02-03 NOTE — Progress Notes (Signed)
   02/03/24 2304  BiPAP/CPAP/SIPAP  BiPAP/CPAP/SIPAP Pt Type Adult  BiPAP/CPAP/SIPAP Resmed  Reason BIPAP/CPAP not in use Other(comment)  Mask Type Full face mask  Dentures removed? Not applicable  Mask Size Large

## 2024-02-03 NOTE — TOC CM/SW Note (Signed)
 Transition of Care [facility]) - Inpatient Brief Assessment   Patient Details  Name: Robert [name] MRN: [id number] Date of Birth: Dec 07, 1955  Transition of Care [facility]) CM/SW Contact:    Lauraine FORBES Saa, LCSW Phone Number: 02/03/2024, 9:04 AM   Clinical Narrative:  9:04 AM Per chart review, patient resides at home with spouse. Patient has a PCP and insurance. Patient does not have SNF or HH history. Patient has DME (diabetic shoes, CPAP, rolling walker) history with [facility]. Patient's preferred pharmacy's are [facility], [facility] [address], and [facility] [address] No TOC needs were identified at this time. TOC will continue to follow and be available to assist.  Transition of Care Asessment: Insurance and Status: Insurance coverage has been reviewed Patient has primary care physician: Yes Home environment has been reviewed: Private Residence Prior level of function:: N/A Prior/Current Home Services: No current home services Social Drivers of Health Review: SDOH reviewed no interventions necessary Readmission risk has been reviewed: Yes Transition of care needs: no transition of care needs at this time

## 2024-02-03 NOTE — Progress Notes (Signed)
 Patient ID: Robert [name], male   DOB: 11-May-1956, 68 y.o.   MRN: [id number] Was able to get images opened up to review combination of problems moderately severe spinal stenosis at L3-4 above his 4 prior fusion significant right-sided L5-S1 compression at the L5-S1 disc space.  I think that patient is somewhat better on IV steroid I think we should try to get him a lumbar dural steroid injection if I can do it during his hospitalization at L5 on the right.

## 2024-02-03 NOTE — Plan of Care (Signed)

## 2024-02-03 NOTE — Progress Notes (Signed)
  Progress Note   Patient: Robert [name] FMW:[id number] DOB: 12/11/1955 DOA: 02/02/2024     0 DOS: the patient was seen and examined on 02/03/2024    Assessment and Plan: Severe low back pain w/ R sided radicular symptoms  - Neurosurgery consult appreciated - IV dexamethasone  4 mg q6 hr  - IV robaxin  500 mg q6 hr PRN  - Oxycodone  PRN  - IV dilaudid  1 mg q4 hr PRN   - IV NS 75 cc/hr - PT/OT   Asthma - Monitor   HTN - Norvasc  5 mg PO daily  - Avapro  300 mg PO daily   HLD - Crestor  5 mg PO daily   DM2 - Novolog  SS ACHS   OSA  - CPAP  7. Constipation  - Colace 100 mg PO bid  - Miralax  17 g PO daily   Subjective: Pt seen and examined at the bedside. Back pain slightly improved but still present. He continues on IV steroids and robaxin . PT/OT and pain management. Spoke with pt's wife on the telephone 02/03/2024, however, she wished to speak with Dr. Arley Helling from neurosurgery. I called the neurosurgery office and left a message with the office secretary for Dr. Helling to directly call the pt's wife.   Physical Exam: Vitals:   02/02/24 2147 02/02/24 2219 02/03/24 0348 02/03/24 0900  BP: 113/70 136/78 (!) 99/53 (!) 93/52  Pulse: 67 63 (!) 59 60  Resp: 18 16 16 17   Temp: 98.2 F (36.8 C) 98.5 F (36.9 C) 97.6 F (36.4 C) 98.1 F (36.7 C)  TempSrc:  Oral Oral Oral  SpO2: 99% 95% 94% 95%  Weight:      Height:       Physical Exam HENT:     Head: Normocephalic.     Mouth/Throat:     Mouth: Mucous membranes are moist.  Cardiovascular:     Rate and Rhythm: Normal rate.  Pulmonary:     Effort: Pulmonary effort is normal.  Abdominal:     Palpations: Abdomen is soft.  Skin:    General: Skin is warm.  Neurological:     Mental Status: He is alert. Mental status is at baseline.     Comments: Pt able to wiggle all of his toes but with back pain  Psychiatric:        Mood and Affect: Mood normal.      Disposition: Status is: Observation The patient remains OBS  appropriate and will d/c before 2 midnights.  Planned Discharge Destination: Barriers to discharge: IV steroids, back pain PT/OT    Time spent: 35 minutes  Author: Miasha Emmons , MD 02/03/2024 12:39 PM  For on call review [email or web address].

## 2024-02-04 ENCOUNTER — Inpatient Hospital Stay (HOSPITAL_COMMUNITY)

## 2024-02-04 DIAGNOSIS — M545 Low back pain, unspecified: Secondary | ICD-10-CM | POA: Diagnosis not present

## 2024-02-04 HISTORY — PX: IR INJECT/THERA/INC NEEDLE/CATH/PLC EPI/LUMB/SAC W/IMG: IMG6130

## 2024-02-04 LAB — COMPREHENSIVE METABOLIC PANEL WITH GFR
ALT: 19 U/L (ref 0–44)
AST: 16 U/L (ref 15–41)
Albumin: 3.4 g/dL — ABNORMAL LOW (ref 3.5–5.0)
Alkaline Phosphatase: 55 U/L (ref 38–126)
Anion gap: 8 (ref 5–15)
BUN: 29 mg/dL — ABNORMAL HIGH (ref 8–23)
CO2: 23 mmol/L (ref 22–32)
Calcium: 9.1 mg/dL (ref 8.9–10.3)
Chloride: 102 mmol/L (ref 98–111)
Creatinine, Ser: 0.85 mg/dL (ref 0.61–1.24)
GFR, Estimated: >60 mL/min (ref >60)
Glucose, Bld: 141 mg/dL — ABNORMAL HIGH (ref 70–99)
Potassium: 4.6 mmol/L (ref 3.5–5.1)
Sodium: 133 mmol/L — ABNORMAL LOW (ref 135–145)
Total Bilirubin: 0.8 mg/dL (ref 0.0–1.2)
Total Protein: 6.2 g/dL — ABNORMAL LOW (ref 6.5–8.1)

## 2024-02-04 LAB — CBC
HCT: 42.7 % (ref 39.0–52.0)
Hemoglobin: 15.1 g/dL (ref 13.0–17.0)
MCH: 30.9 pg (ref 26.0–34.0)
MCHC: 35.4 g/dL (ref 30.0–36.0)
MCV: 87.3 fL (ref 80.0–100.0)
Platelets: 173 K/uL (ref 150–400)
RBC: 4.89 MIL/uL (ref 4.22–5.81)
RDW: 13 % (ref 11.5–15.5)
WBC: 10.1 K/uL (ref 4.0–10.5)
nRBC: 0 % (ref 0.0–0.2)

## 2024-02-04 LAB — MAGNESIUM: Magnesium: 2.1 mg/dL (ref 1.7–2.4)

## 2024-02-04 LAB — PROTIME-INR
INR: 1.2 (ref 0.8–1.2)
Prothrombin Time: 15.6 s — ABNORMAL HIGH (ref 11.4–15.2)

## 2024-02-04 LAB — GLUCOSE, CAPILLARY
Glucose-Capillary: 143 mg/dL — ABNORMAL HIGH (ref 70–99)
Glucose-Capillary: 140 mg/dL — ABNORMAL HIGH (ref 70–99)
Glucose-Capillary: 134 mg/dL — ABNORMAL HIGH (ref 70–99)
Glucose-Capillary: 141 mg/dL — ABNORMAL HIGH (ref 70–99)

## 2024-02-04 MED ORDER — METHYLPREDNISOLONE ACETATE 80 MG/ML IJ SUSP
INTRAMUSCULAR | Status: AC
Start: 2024-02-04 — End: 2024-02-04
  Filled 2024-02-04: qty 1

## 2024-02-04 MED ORDER — IOHEXOL 180 MG/ML  SOLN
INTRAMUSCULAR | Status: AC
  Filled 2024-02-04: qty 10

## 2024-02-04 MED ORDER — LIDOCAINE HCL (PF) 1 % IJ SOLN
30.0000 mL | Freq: Once | INTRAMUSCULAR | Status: DC
  Filled 2024-02-04: qty 30

## 2024-02-04 MED ORDER — LIDOCAINE HCL (PF) 1 % IJ SOLN
INTRAMUSCULAR | Status: AC
  Filled 2024-02-04: qty 30

## 2024-02-04 MED ORDER — METHYLPREDNISOLONE ACETATE 40 MG/ML IJ SUSP
80.0000 mg | INTRAMUSCULAR | Status: AC
  Filled 2024-02-04: qty 2

## 2024-02-04 MED ORDER — METHYLPREDNISOLONE ACETATE 80 MG/ML IJ SUSP
80.0000 mg | Freq: Once | INTRAMUSCULAR | Status: DC
  Filled 2024-02-04: qty 1

## 2024-02-04 NOTE — Evaluation (Signed)
 Occupational Therapy Evaluation Patient Details Name: Robert [name] MRN: [id number] DOB: 1956-05-05 Today's Date: 02/04/2024   History of Present Illness   Robert [name] is a 68 y.o. male admitted 02/02/24 due to un retracting back pain: right-sided radicular symptoms x 5 days (trouble walking). MRI revealed moderately severe spinal stenosis at L3-4 above his 4 prior fusion significant right-sided L5-S1 compression at the L5-S1 disc space. NSG non-op management. PMH includes asthma, allergic rhinitis, chronic back pain, hypertension, hyperlipidemia, Barrett's esophagus, depression, type 2 diabetes, diabetic foot infections/osteomyelitis/toe amputations, OSA on CPAP     Clinical Impressions Pt is typically independent in ADL and mobility with SPC. He teaches guitar, drives. Today he is limited by back pain. He reports huge change in overall function the past 5 days. Unable to get below his knees due to pain that starts in lower back and radiates down R leg. He is needing RW for support during mobility, today he was mod A for LB ADL, min A for UB ADL, GCA for bed mobility (good log roll). While in sitting he requires left lateral lean to off weight his R side or it goes numb, tingly, fire, and pain. At this time recommending continued skilled OT in the acute setting as well as afterwards at the [facility] level pending decision by NSG on management. Next session focus on AE education to maximize safety and independence in ADL and functional transfers.     If plan is discharge home, recommend the following:   A little help with walking and/or transfers;A lot of help with bathing/dressing/bathroom;Assistance with cooking/housework;Assist for transportation;Help with stairs or ramp for entrance     Functional Status Assessment   Patient has had a recent decline in their functional status and demonstrates the ability to make significant improvements in function in a reasonable and predictable amount of  time.     Equipment Recommendations   BSC/3in1;Tub/shower bench     Recommendations for Other Services   PT consult     Precautions/Restrictions   Precautions Precautions: Back Precaution Booklet Issued: No Restrictions Weight Bearing Restrictions Per Provider Order: No     Mobility Bed Mobility Overal bed mobility: Needs Assistance Bed Mobility: Rolling, Sidelying to Sit, Sit to Sidelying Rolling: Contact guard assist Sidelying to sit: Contact guard assist     Sit to sidelying: Contact guard assist General bed mobility comments: good log roll technique    Transfers                   General transfer comment: NT due to recent participation in PT and desire for steroids that require Pt to lay flat for 15 min after administration      Balance Overall balance assessment: Needs assistance Sitting-balance support: Single extremity supported, Feet supported Sitting balance-Leahy Scale: Poor Sitting balance - Comments: left lean to alieviate pain - reports unable to sit upright because his leg tingles, then feels like it is on fire, and pain spikes to 12/10 Postural control: Left lateral lean                                 ADL either performed or assessed with clinical judgement   ADL Overall ADL's : Needs assistance/impaired Eating/Feeding: Modified independent   Grooming: Set up;Sitting Grooming Details (indicate cue type and reason): unable to maintain standing for grooming at this time Upper Body Bathing: Minimal assistance   Lower Body Bathing: Moderate assistance  Lower Body Bathing Details (indicate cue type and reason): knees down Upper Body Dressing : Moderate assistance;Sitting   Lower Body Dressing: Moderate assistance;Sit to/from stand Lower Body Dressing Details (indicate cue type and reason): knees down, wife has been helping him               General ADL Comments: Pt limited by pain, motivated     Vision  Baseline Vision/History: 1 Wears glasses Ability to See in Adequate Light: 0 Adequate Patient Visual Report: No change from baseline       Perception         Praxis         Pertinent Vitals/Pain Pain Assessment Pain Assessment: 0-10 Pain Score: 10-Worst pain ever Pain Location: low back radiating down R leg Pain Descriptors / Indicators: Discomfort, Penetrating, Stabbing, Pressure, Radiating, Tingling Pain Intervention(s): Limited activity within patient's tolerance, Monitored during session, Repositioned     Extremity/Trunk Assessment Upper Extremity Assessment Upper Extremity Assessment: Overall WFL for tasks assessed   Lower Extremity Assessment Lower Extremity Assessment: Defer to PT evaluation   Cervical / Trunk Assessment Cervical / Trunk Assessment: Other exceptions Cervical / Trunk Exceptions: hx of back sx, spinal stenosis currently   Communication Communication Communication: No apparent difficulties   Cognition Arousal: Alert Behavior During Therapy: WFL for tasks assessed/performed Cognition: No apparent impairments                               Following commands: Intact       Cueing  General Comments   Cueing Techniques: Verbal cues  reports constipation. Provided with warm prune juice   Exercises     Shoulder Instructions      Home Living Family/patient expects to be discharged to:: Private residence Living Arrangements: Spouse/significant other;Children (adult children) Available Help at Discharge: Family;Available PRN/intermittently Type of Home: House Home Access: Stairs to enter [employer] of Steps: 4 Entrance Stairs-Rails: Right;Left Home Layout: One level     Bathroom Shower/Tub: Tub/shower unit         Home Equipment: Agricultural consultant (2 wheels);Cane - single point;Crutches   Additional Comments: teaches guitar, plays guitar      Prior Functioning/Environment Prior Level of Function :  Independent/Modified Independent             Mobility Comments: SPC at baseline ADLs Comments: independent including taking care of feral cat family in the carport :)    OT Problem List: Decreased strength;Decreased activity tolerance;Impaired balance (sitting and/or standing);Decreased knowledge of use of DME or AE;Obesity;Impaired sensation;Pain   OT Treatment/Interventions: Self-care/ADL training      OT Goals(Current goals can be found in the care plan section)   Acute Rehab OT Goals Patient Stated Goal: get pain improved - even if that means surgery OT Goal Formulation: With patient Time For Goal Achievement: 02/18/24 Potential to Achieve Goals: Good ADL Goals Pt Will Perform Grooming: with supervision;standing Pt Will Perform Upper Body Dressing: with modified independence;sitting Pt Will Perform Lower Body Dressing: with supervision;sit to/from stand;with adaptive equipment Pt Will Transfer to Toilet: with supervision Pt Will Perform Toileting - Clothing Manipulation and hygiene: with modified independence;sitting/lateral leans Additional ADL Goal #1: Pt will perform bed mobility at mod I level maintaining back precautions with no cues prior to engaging in ADL   OT Frequency:  Min 2X/week    Co-evaluation              AM-PAC OT  6 Clicks Daily Activity     Outcome Measure Help from another person eating meals?: None Help from another person taking care of personal grooming?: A Little Help from another person toileting, which includes using toliet, bedpan, or urinal?: A Lot Help from another person bathing (including washing, rinsing, drying)?: A Lot Help from another person to put on and taking off regular upper body clothing?: A Little Help from another person to put on and taking off regular lower body clothing?: A Lot 6 Click Score: 16   End of Session Nurse Communication: Mobility status  Activity Tolerance: Patient limited by pain Patient left: in  bed;with call bell/phone within reach;with bed alarm set  OT Visit Diagnosis: Unsteadiness on feet (R26.81);Other abnormalities of gait and mobility (R26.89);Muscle weakness (generalized) (M62.81);Pain Pain - Right/Left: Right Pain - part of body: Leg                Time: 8858-8794 OT Time Calculation (min): 24 min Charges:  OT General Charges $OT Visit: 1 Visit OT Evaluation $OT Eval Moderate Complexity: 1 Mod OT Treatments $Self Care/Home Management : 8-22 mins Leita DEL OTR/L Acute Rehabilitation Services Office: [phone]  Leita PARAS [facility] 02/04/2024, 2:15 PM

## 2024-02-04 NOTE — Evaluation (Signed)
 Physical Therapy Evaluation Patient Details Name: Robert [name] MRN: [id number] DOB: 02/16/1956 Today's Date: 02/04/2024  History of Present Illness  Robert [name] is a 68 y.o. male admitted 02/02/24 due to un retracting back pain: right-sided radicular symptoms x 5 days (trouble walking). MRI revealed moderately severe spinal stenosis at L3-4 above his 4 prior fusion significant right-sided L5-S1 compression at the L5-S1 disc space. NSG non-op management. PMH includes asthma, allergic rhinitis, chronic back pain, hypertension, hyperlipidemia, Barrett's esophagus, depression, type 2 diabetes, diabetic foot infections/osteomyelitis/toe amputations, OSA on CPAP  Clinical Impression   Pt admitted with above diagnosis. Lives at home with wife, in a single-level home with 4 steps to enter; Prior to admission, pt independent, walking with a single point cane, attending Outpt PT; Presents to PT with Low back pain radiating down his RLE resulting in decr functional mobility, and antalgic gait pattern as well as antalgic L lean while sitting; Able to get up via log roll technique, and stand to RW, walk short distance with RW, all with CGA to supervision; Hopefully he will get relief form procedure this afternoon, and pt can find some relies; anticipate good progress;  Pt currently with functional limitations due to the deficits listed below (see PT Problem List). Pt will benefit from skilled PT to increase their independence and safety with mobility to allow discharge to the venue listed below.           If plan is discharge home, recommend the following: Assistance with cooking/housework   Can travel by Garment/textile technologist BSC/3in1;Other (comment) (consider BSC vs shower chair)  Recommendations for Other Services  OT consult    Functional Status Assessment Patient has had a recent decline in their functional status and demonstrates the ability to make significant  improvements in function in a reasonable and predictable amount of time.     Precautions / Restrictions Precautions Precautions: Back Precaution Booklet Issued: No Restrictions Weight Bearing Restrictions Per Provider Order: No      Mobility  Bed Mobility Overal bed mobility: Needs Assistance Bed Mobility: Rolling, Sidelying to Sit, Sit to Sidelying Rolling: Contact guard assist Sidelying to sit: Contact guard assist     Sit to sidelying: Contact guard assist General bed mobility comments: good log roll technique    Transfers Overall transfer level: Needs assistance Equipment used: Rolling walker (2 wheels) Transfers: Sit to/from Stand Sit to Stand: Supervision, Min assist           General transfer comment: supervision from bed; min assist from lower commode    Ambulation/Gait Ambulation/Gait assistance: Contact guard assist Gait Distance (Feet): 20 Feet Assistive device: Rolling walker (2 wheels) Gait Pattern/deviations: Step-through pattern, Antalgic       General Gait Details: Maintains grossly TWB RLE due to pain with pressure  Stairs            Wheelchair Mobility     Tilt Bed    Modified Rankin (Stroke Patients Only)       Balance     Sitting balance-Leahy Scale: Fair Sitting balance - Comments: left lean to alieviate pain - reports unable to sit upright because his leg tingles, then feels like it is on fire, and pain spikes to 12/10 Postural control: Left lateral lean  Pertinent Vitals/Pain Pain Assessment Pain Assessment: Faces Faces Pain Scale: Hurts even more Pain Location: low back radiating down R leg Pain Descriptors / Indicators: Discomfort, Penetrating, Stabbing, Pressure, Radiating, Tingling Pain Intervention(s): Monitored during session, Premedicated before session, Repositioned    Home Living Family/patient expects to be discharged to:: Private residence Living  Arrangements: Spouse/significant other;Children Available Help at Discharge: Family;Available PRN/intermittently Type of Home: House Home Access: Stairs to enter Entrance Stairs-Rails: Doctor, general practice of Steps: 4   Home Layout: One level Home Equipment: Agricultural consultant (2 wheels);Cane - single point;Crutches Additional Comments: teaches guitar, plays guitar    Prior Function Prior Level of Function : Independent/Modified Independent             Mobility Comments: SPC at baseline ADLs Comments: independent including taking care of feral cat family in the carport :)     Extremity/Trunk Assessment   Upper Extremity Assessment Upper Extremity Assessment: Defer to OT evaluation    Lower Extremity Assessment Lower Extremity Assessment: RLE deficits/detail RLE Deficits / Details: Pain rediating down R LE; R calf with burning pain; decr weight aceptance RLE in standing; tends to sit antalgically with L lean    Cervical / Trunk Assessment Cervical / Trunk Assessment: Other exceptions Cervical / Trunk Exceptions: hx of back sx, spinal stenosis currently  Communication   Communication Communication: No apparent difficulties    Cognition Arousal: Alert Behavior During Therapy: WFL for tasks assessed/performed                             Following commands: Intact       Cueing Cueing Techniques: Verbal cues     General Comments General comments (skin integrity, edema, etc.): reports constipation. Provided with warm prune juice    Exercises     Assessment/Plan    PT Assessment Patient needs continued PT services  PT Problem List Decreased strength;Decreased range of motion;Decreased activity tolerance;Decreased balance;Decreased mobility;Pain       PT Treatment Interventions DME instruction;Gait training;Stair training;Functional mobility training;Therapeutic activities;Therapeutic exercise;Balance training;Patient/family education;Manual  techniques    PT Goals (Current goals can be found in the Care Plan section)  Acute Rehab PT Goals Patient Stated Goal: Hopes to get relief from pain PT Goal Formulation: With patient Time For Goal Achievement: 02/18/24 Potential to Achieve Goals: Good    Frequency Min 2X/week     Co-evaluation               AM-PAC PT 6 Clicks Mobility  Outcome Measure Help needed turning from your back to your side while in a flat bed without using bedrails?: A Little Help needed moving from lying on your back to sitting on the side of a flat bed without using bedrails?: A Little Help needed moving to and from a bed to a chair (including a wheelchair)?: A Little Help needed standing up from a chair using your arms (e.g., wheelchair or bedside chair)?: A Little Help needed to walk in hospital room?: A Little Help needed climbing 3-5 steps with a railing? : A Lot 6 Click Score: 17    End of Session   Activity Tolerance: Patient tolerated treatment well Patient left: in bed;with call bell/phone within reach Nurse Communication: Mobility status PT Visit Diagnosis: Unsteadiness on feet (R26.81);Other abnormalities of gait and mobility (R26.89);Pain Pain - Right/Left: Right Pain - part of body: Leg    Time: 1030-1104 PT Time Calculation (min) (ACUTE ONLY): 34 min  Charges:   PT Evaluation $PT Eval Low Complexity: 1 Low PT Treatments $Gait Training: 8-22 mins PT General Charges $$ ACUTE PT VISIT: 1 Visit         Silvano Currier, PT  Acute Rehabilitation Services Office [phone] Secure Chat welcomed   Silvano VEAR Currier 02/04/2024, 2:56 PM

## 2024-02-04 NOTE — TOC Initial Note (Signed)
 Transition of Care [facility]) - Initial/Assessment Note    Patient Details  Name: Robert [name] MRN: [id number] Date of Birth: Jan 06, 1956  Transition of Care [facility]) CM/SW Contact:    Roxie KANDICE Stain, RN Phone Number: 02/04/2024, 4:57 PM  Clinical Narrative:                 Spoke to patient regarding transition needs.  Patient is agreeable to OP PT, OT on [address]. Referral sent. DME ordered from [employer].  TOC will continue follow for needs.  Expected Discharge Plan: OP Rehab Barriers to Discharge: Continued Medical Work up   Patient Goals and CMS Choice Patient states their goals for this hospitalization and ongoing recovery are:: return home CMS Medicare.gov Compare Post Acute Care list provided to:: Patient Choice offered to / list presented to : Patient      Expected Discharge Plan and Services   Discharge Planning Services: CM Consult Post Acute Care Choice: Durable Medical Equipment Living arrangements for the past 2 months: Single Family Home                 DME Arranged: Bedside commode DME Agency: [employer], [employer] Date DME Agency Contacted: 02/04/24 Time DME Agency Contacted: 1655 Representative spoke with at DME Agency: jermaine            Prior Living Arrangements/Services Living arrangements for the past 2 months: Single Family Home Lives with:: Spouse Patient language and need for interpreter reviewed:: Yes Do you feel safe going back to the place where you live?: Yes      Need for Family Participation in Patient Care: Yes (Comment) Care giver support system in place?: Yes (comment) Current home services: DME Criminal Activity/Legal Involvement Pertinent to Current Situation/Hospitalization: No - Comment as needed  Activities of Daily Living   ADL Screening (condition at time of admission) Independently performs ADLs?: Yes (appropriate for developmental age) Is the patient deaf or have difficulty hearing?: No Does the patient have  difficulty seeing, even when wearing glasses/contacts?: No Does the patient have difficulty concentrating, remembering, or making decisions?: No  Permission Sought/Granted                  Emotional Assessment Appearance:: Appears stated age Attitude/Demeanor/Rapport: Gracious Affect (typically observed): Accepting Orientation: : Oriented to Self, Oriented to Place, Oriented to  Time, Oriented to Situation Alcohol / Substance Use: Not Applicable Psych Involvement: No (comment)  Admission diagnosis:  Low back pain [M54.50] Intractable back pain [M54.9] Acute right-sided low back pain with right-sided sciatica [M54.41] Patient Active Problem List   Diagnosis Date Noted   Low back pain 02/02/2024   Type 2 diabetes mellitus with diabetic peripheral angiopathy without gangrene, without long-term current use of insulin  (HCC) 04/11/2023   Acute osteomyelitis of toe, right (HCC) 04/10/2023   Secondary hypertension 04/10/2023   Type 2 diabetes mellitus (HCC) 06/02/2022   Conjunctivitis due to adenovirus, left eye 09/11/2018   Type 2 diabetes mellitus with foot ulcer (CODE) (HCC) 08/21/2018   Amputation of second toe, right, traumatic, sequela (HCC) 08/21/2018   Osteomyelitis of fourth toe of left foot (HCC)    Lower extremity cellulitis 06/08/2018   Hepatitis C antibody test positive 04/16/2018   Osteomyelitis of toe of right foot (HCC) 04/10/2018   Hyperlipidemia 04/08/2018   Recurrent cellulitis of lower extremity 04/07/2018   Chronic neuropathy of both feet-  due to lumbar radiculopathy as well as diabetic neuropathy 03/25/2018   Pancytopenia (HCC) 03/15/2018   DJD (  degenerative joint disease) 01/09/2018   Diabetic foot infection (HCC) 09/30/2017   Essential hypertension 09/30/2017   Mixed diabetic hyperlipidemia associated with type 2 diabetes mellitus (HCC) 09/30/2017   Barrett's esophagus 12/07/2016   Chronic venous stasis dermatitis of both lower extremities 05/08/2016    Vitamin D  deficiency 05/08/2016   OSA (obstructive sleep apnea) 04/11/2016   Bilateral lower extremity pitting edema 04/11/2016   Physical deconditioning 04/11/2016   Restrictive lung disease secondary to obesity 04/11/2016   History of tobacco use 04/11/2016   h/o Depressive disorder 04/11/2016   Lumbar radiculopathy 07/21/2012   Chronic pain due to injury 07/21/2012   Morbid obesity (HCC) 02/15/2012   Postlaminectomy syndrome, lumbar region 01/04/2012   h/o Narcotic abuse 01/04/2012   Constipation 09/22/2011   Chronic back pain 09/22/2011   PCP:  [facility]:   [id number] [address], [place] - [address] [place] [address] Phone: [phone] Fax: [phone]  [facility] [address] [place] [address] Phone: [phone] Fax: [phone]  [facility] [address] [place]), [place] - [address] [place] [address] Phone: [phone] Fax: [phone]     Social Drivers of Health (SDOH) Social History: SDOH Screenings   Food Insecurity: No Food Insecurity (02/03/2024)  Housing: Low Risk  (02/03/2024)  Transportation Needs: No Transportation Needs (02/03/2024)  Utilities: Not At Risk (02/03/2024)  Depression (PHQ2-9): Low Risk  (04/03/2021)  Social Connections: Unknown (02/03/2024)  Tobacco Use: Medium Risk (02/02/2024)   SDOH Interventions:     Readmission Risk Interventions     No data to display

## 2024-02-04 NOTE — Progress Notes (Signed)
 Patient approved for ESI/NRB with Dr. Johann. Tentatively planned 7/22 pending INR.

## 2024-02-04 NOTE — Progress Notes (Addendum)
 PROGRESS NOTE    Robert [name]  FMW:[id number] DOB: 1956/04/08 DOA: 02/02/2024 PCP: [facility]   Brief Narrative:   68 y.o. male with medical history significant of asthma, allergic rhinitis, chronic back pain, hypertension, hyperlipidemia, Barrett's esophagus, depression, type 2 diabetes, diabetic foot infections/osteomyelitis/toe amputations, OSA on CPAP presenting with complaints of severe low back pain with right-sided radicular symptoms x 5 days.  History of previous lumbar fusion of L4-5.  Patient denies any recent falls or injury to his back.  Denies fevers or chills.  Denies lower extremity weakness or symptoms of bowel or bladder dysfunction.  He is having difficulty ambulating due to severe pain.  States he was recently seen by orthopedics and had an MRI done 2 days before admission. Dr Onetha on board, he is on IV steroids and plan is IR guided lumbar dural steroid injection.    Assessment & Plan:  Principal Problem:   Low back pain Active Problems:   Essential hypertension   Type 2 diabetes mellitus (HCC)   Hyperlipidemia   OSA (obstructive sleep apnea)   Severe low back pain w/ R sided radiculopathy and neuropathy: Improving symptoms. No bowel or bladder incontinence Symptoms include severe right sided back pain, left posterior thigh numbness and left calf burning sensation -Prior L4-L5 fusion done long time back - Dr Onetha on board. Plan for IR guided lumbar dural steroid injection (ESI/NRB). - IV dexamethasone  4 mg q6 hr  - IV robaxin  500 mg q6 hr PRN  - Oxycodone  PRN  - IV dilaudid  1 mg q4 hr PRN   - Idced IVF on 7/22 - PT/OT    Asthma - Monitor    HTN - Norvasc  5 mg PO daily  - Avapro  300 mg PO daily    Hyperlipidemia: - Crestor  5 mg PO daily    DM type 2:  - Novolog  SS ACHS  - Carb controlled diet   OSA  - CPAP   Constipation:worsened in the setting of opioid use - Colace 100 mg PO bid  - Miralax  17 g PO daily   Class III obesity  with adiposopathy symptoms: -On Tirzepatide at home -Complicating care  Severe left knee osteoarthritis: Needs left knee replacement but he needs to lose weight before he can undergo surgery He is on Tirzepatide at home.   Disposition: Lives at home with his wife.    DVT prophylaxis: SCDs Start: 02/02/24 2347     Code Status: Full Code Family Communication:   Status is: Inpatient Remains inpatient appropriate because: acute lumbar radiculopathy, neuropathy  Subjective:  He says his pain is at a constant 6/10 now, it was worse before that. IV Steroids have been helping him. He said that it all started back in 1997 but it has gotten worse in the past three weeks to the point that he can only lay on his left side and can't do anything else.He goes to [facility] for outpt PT and he said this severe pain started on Monday night after PT session. He went to [facility] and was also seen by Dr Onetha who recommended that he should be taken to ER for further evaluation. This pain has interfered with his work as well. Lives at home with his wife.   Examination:  General exam: laying on his left side, alert and awake Respiratory system: Clear to auscultation. Respiratory effort normal. Cardiovascular system: S1 & S2 heard, RRR. No JVD, murmurs, rubs, gallops or clicks. No pedal edema. Gastrointestinal system: Abdomen is  nondistended, soft and nontender. No organomegaly or masses felt. Normal bowel sounds heard. Central nervous system: Alert and oriented. Slight decreased sensations over left posterior thigh Extremities: Symmetric 5 x 5 power. Skin: No rashes, lesions or ulcers Psychiatry: Judgement and insight appear normal. Mood & affect appropriate.       Diet Orders (From admission, onward)     Start     Ordered   02/02/24 2348  Diet heart healthy/carb modified Room service appropriate? Yes; Fluid consistency: Thin  Diet effective now       Question Answer Comment  Diet-HS Snack?  Nothing   Room service appropriate? Yes   Fluid consistency: Thin      02/02/24 2349            Objective: Vitals:   02/03/24 1625 02/03/24 2004 02/04/24 0305 02/04/24 0754  BP: 121/75 120/65 112/65 125/72  Pulse: 60 60 62 (!) 58  Resp: 17 16 16    Temp:  98.1 F (36.7 C) 98.6 F (37 C) 98 F (36.7 C)  TempSrc:  Oral Oral   SpO2: 94% 96% 97% 95%  Weight:      Height:        Intake/Output Summary (Last 24 hours) at 02/04/2024 0949 Last data filed at 02/04/2024 0420 Gross per 24 hour  Intake 1000 ml  Output --  Net 1000 ml   Filed Weights   02/02/24 1429  Weight: (!) 142.4 kg    Scheduled Meds:  amLODipine   5 mg Oral Daily   And   irbesartan   300 mg Oral Daily   dexamethasone  (DECADRON ) injection  4 mg Intravenous Q6H   docusate sodium   100 mg Oral BID   insulin  aspart  0-5 Units Subcutaneous QHS   insulin  aspart  0-9 Units Subcutaneous TID WC   polyethylene glycol  17 g Oral Daily   rosuvastatin   5 mg Oral Daily   Continuous Infusions:  sodium chloride  75 mL/hr at 02/04/24 9471    Nutritional status     Body mass index is 43.79 kg/m.  Data Reviewed:   CBC: Recent Labs  Lab 02/02/24 1520 02/04/24 0653  WBC 7.3 10.1  NEUTROABS 5.8  --   HGB 15.3 15.1  HCT 43.2 42.7  MCV 87.3 87.3  PLT 177 173   Basic Metabolic Panel: Recent Labs  Lab 02/02/24 1520 02/04/24 0653  NA 137 133*  K 4.3 4.6  CL 105 102  CO2 24 23  GLUCOSE 124* 141*  BUN 27* 29*  CREATININE 0.79 0.85  CALCIUM  9.1 9.1  MG  --  2.1   GFR: Estimated Creatinine Clearance: 118.4 mL/min (by C-G formula based on SCr of 0.85 mg/dL). Liver Function Tests: Recent Labs  Lab 02/04/24 0653  AST 16  ALT 19  ALKPHOS 55  BILITOT 0.8  PROT 6.2*  ALBUMIN 3.4*   No results for input(s): LIPASE, AMYLASE in the last 168 hours. No results for input(s): AMMONIA in the last 168 hours. Coagulation Profile: No results for input(s): INR, PROTIME in the last 168  hours. Cardiac Enzymes: No results for input(s): CKTOTAL, CKMB, CKMBINDEX, TROPONINI in the last 168 hours. BNP (last 3 results) No results for input(s): PROBNP in the last 8760 hours. HbA1C: Recent Labs    02/03/24 0116  HGBA1C 4.8   CBG: Recent Labs  Lab 02/03/24 0823 02/03/24 1157 02/03/24 1627 02/03/24 2103 02/04/24 0754  GLUCAP 129* 137* 115* 153* 143*   Lipid Profile: No results for input(s): CHOL, HDL, LDLCALC, TRIG,  CHOLHDL, LDLDIRECT in the last 72 hours. Thyroid  Function Tests: No results for input(s): TSH, T4TOTAL, FREET4, T3FREE, THYROIDAB in the last 72 hours. Anemia Panel: No results for input(s): VITAMINB12, FOLATE, FERRITIN, TIBC, IRON, RETICCTPCT in the last 72 hours. Sepsis Labs: No results for input(s): PROCALCITON, LATICACIDVEN in the last 168 hours.  No results found for this or any previous visit (from the past 240 hours).       Radiology Studies: No results found.      LOS: 1 day   Time spent= 43 mins    Deliliah Room, MD [facility]  If 7PM-7AM, please contact night-coverage  02/04/2024, 9:49 AM

## 2024-02-04 NOTE — Procedures (Signed)
  Procedure:  R L5 nerve root block + transforaminal epidural steroid injection 80mg  depomedrol + 2ml lidocaine  1% Preprocedure diagnosis: The primary encounter diagnosis was Acute right-sided low back pain with right-sided sciatica. A diagnosis of Intractable back pain was also pertinent to this visit. Postprocedure diagnosis: same EBL:    minimal Complications:   none immediate  See full dictation in [employer].  CHARM Toribio Faes MD Main # [phone] Pager  [phone] Mobile [phone]

## 2024-02-04 NOTE — Progress Notes (Signed)
 Physical Therapy Note  PT eval complete with full note to follow;  Recommend getting back to his Outpt PT for PT follow up;  Recommend 3in1/shower seat as well;   Silvano Currier, PT  Acute Rehabilitation Services Pager [phone] Office [phone]

## 2024-02-04 NOTE — Plan of Care (Signed)
  Problem: Education: Goal: Knowledge of General Education information will improve Description: Including pain rating scale, medication(s)/side effects and non-pharmacologic comfort measures Outcome: Progressing   Problem: Clinical Measurements: Goal: Will remain free from infection Outcome: Progressing   Problem: Activity: Goal: Risk for activity intolerance will decrease Outcome: Progressing   Problem: Nutrition: Goal: Adequate nutrition will be maintained Outcome: Progressing   Problem: Coping: Goal: Level of anxiety will decrease Outcome: Progressing   Problem: Elimination: Goal: Will not experience complications related to bowel motility Outcome: Progressing Goal: Will not experience complications related to urinary retention Outcome: Progressing   Problem: Pain Managment: Goal: General experience of comfort will improve and/or be controlled Outcome: Progressing   Problem: Safety: Goal: Ability to remain free from injury will improve Outcome: Progressing   Problem: Skin Integrity: Goal: Risk for impaired skin integrity will decrease Outcome: Progressing   Problem: Education: Goal: Ability to describe self-care measures that may prevent or decrease complications (Diabetes Survival Skills Education) will improve Outcome: Progressing   Problem: Nutritional: Goal: Maintenance of adequate nutrition will improve Outcome: Progressing

## 2024-02-04 NOTE — Progress Notes (Signed)
   02/04/24 2300  BiPAP/CPAP/SIPAP  Reason BIPAP/CPAP not in use Non-compliant (Pt declined Cpap for the night. Advised to call if he changes his mind and wants to utilize)

## 2024-02-05 DIAGNOSIS — M545 Low back pain, unspecified: Secondary | ICD-10-CM | POA: Diagnosis not present

## 2024-02-05 LAB — GLUCOSE, CAPILLARY
Glucose-Capillary: 146 mg/dL — ABNORMAL HIGH (ref 70–99)
Glucose-Capillary: 115 mg/dL — ABNORMAL HIGH (ref 70–99)
Glucose-Capillary: 112 mg/dL — ABNORMAL HIGH (ref 70–99)
Glucose-Capillary: 120 mg/dL — ABNORMAL HIGH (ref 70–99)

## 2024-02-05 NOTE — Plan of Care (Signed)
  Problem: Education: Goal: Knowledge of General Education information will improve Description: Including pain rating scale, medication(s)/side effects and non-pharmacologic comfort measures Outcome: Progressing   Problem: Clinical Measurements: Goal: Will remain free from infection Outcome: Progressing   Problem: Coping: Goal: Level of anxiety will decrease Outcome: Progressing   Problem: Elimination: Goal: Will not experience complications related to bowel motility Outcome: Progressing Goal: Will not experience complications related to urinary retention Outcome: Progressing   Problem: Pain Managment: Goal: General experience of comfort will improve and/or be controlled Outcome: Progressing   Problem: Safety: Goal: Ability to remain free from injury will improve Outcome: Progressing   Problem: Skin Integrity: Goal: Risk for impaired skin integrity will decrease Outcome: Progressing   Problem: Education: Goal: Ability to describe self-care measures that may prevent or decrease complications (Diabetes Survival Skills Education) will improve Outcome: Progressing Goal: Individualized Educational Video(s) Outcome: Progressing   Problem: Coping: Goal: Ability to adjust to condition or change in health will improve Outcome: Progressing

## 2024-02-05 NOTE — Progress Notes (Signed)
 Subjective: Patient reports doing a little bit better  Objective: Vital signs in last 24 hours: Temp:  [97.7 F (36.5 C)-98 F (36.7 C)] 97.7 F (36.5 C) (07/23 1612) Pulse Rate:  [56-62] 59 (07/23 1612) Resp:  [16-18] 18 (07/23 1612) BP: (102-113)/(65-75) 102/65 (07/23 1612) SpO2:  [95 %-96 %] 96 % (07/23 1612)  Intake/Output from previous day: No intake/output data recorded. Intake/Output this shift: No intake/output data recorded.  Neurologic: Grossly normal  Lab Results: Lab Results  Component Value Date   WBC 10.1 02/04/2024   HGB 15.1 02/04/2024   HCT 42.7 02/04/2024   MCV 87.3 02/04/2024   PLT 173 02/04/2024   Lab Results  Component Value Date   INR 1.2 02/04/2024   BMET Lab Results  Component Value Date   NA 133 (L) 02/04/2024   K 4.6 02/04/2024   CL 102 02/04/2024   CO2 23 02/04/2024   GLUCOSE 141 (H) 02/04/2024   BUN 29 (H) 02/04/2024   CREATININE 0.85 02/04/2024   CALCIUM  9.1 02/04/2024    Studies/Results: IR INJECT DIAG/THERA/INC NEEDLE/CATH/PLC EPI/LUMB/SAC W/IMG Result Date: 02/04/2024 CLINICAL DATA:  Previous lumbar fusion none L4-5. Severe right lower extremity radicular pain. EXAM: SELECTIVE NERVE ROOT BLOCK AND TRANSFORAMINAL EPIDURAL STEROID INJECTION UNDER FLUOROSCOPY FLUOROSCOPY: Radiation Exposure Index (as provided by the fluoroscopic device): 194.1 mGy air Kerma TECHNIQUE: An appropriate skin entry site was determined under fluoroscopy. Operator donned sterile gloves and mask. Site was marked, prepped with Betadine, draped in usual sterile fashion, infiltrated locally with 1% lidocaine . A 22 gauge 15 cm Chiba needle was advanced to the superior ventral margin of the right L5 - S1 neural foramen. Diagnostic injection of 2 ml Omnipaque  180 showed partial outlining of the exiting nerve root as well as epidural extension of contrast, with no intravascular or subarachnoid component. 80 mg Depo-Medrol  in 3 ml lidocaine  1% was administered. The  patient tolerated procedure well, with no immediate complication. IMPRESSION: 1. Technically successful right L5 selective nerve root block and transforaminal epidural steroid injection Electronically Signed   By: JONETTA Faes M.D.   On: 02/04/2024 16:29    Assessment/Plan: Almost 24 hours s/p ESI and feeling a little better. Continue to work on pain management and therapy   LOS: 2 days    Robert [name] 02/05/2024, 5:36 PM

## 2024-02-05 NOTE — Progress Notes (Signed)
 Pt stated he did not need his CPAP for the night. Pt was told if he changed his mind, to call RT. Rn at bedside.

## 2024-02-05 NOTE — Progress Notes (Signed)
 Mobility Specialist Progress Note:    02/05/24 1056  Mobility  Activity Ambulated with assistance in hallway  Level of Assistance Contact guard assist, steadying assist  Assistive Device Front wheel walker  Distance Ambulated (ft) 84 ft  Activity Response Tolerated well  Mobility Referral Yes  Mobility visit 1 Mobility  Mobility Specialist Start Time (ACUTE ONLY) 0859  Mobility Specialist Stop Time (ACUTE ONLY) 0910  Mobility Specialist Time Calculation (min) (ACUTE ONLY) 11 min   Received pt in bed and agreeable to mobility. No physical assistance needed. C/o sciatic pain, otherwise tolerated well. Returned to room without fault. Personal belongings and call light within reach. All needs met.  Lavanda Pollack Mobility Specialist  Please contact via [employer] or  Rehab Office [phone]

## 2024-02-05 NOTE — Progress Notes (Signed)
 PT Cancellation Note  Patient Details Name: Robert [name] MRN: [id number] DOB: [birth year]   Cancelled Treatment:    Reason Eval/Treat Not Completed: Other (comment) Politely declining PT;   Very tired post ambulating in the hallway with Mobility;  We discussed progress with gait distance and activity tolerance compared to yesterday's session;   Will follow up later today as time allows;  Otherwise, will follow up for PT tomorrow;   Thank you,  Silvano Currier, PT  Acute Rehabilitation Services Office [phone]      Silvano VEAR Currier 02/05/2024, 11:50 AM

## 2024-02-05 NOTE — Progress Notes (Signed)
 PROGRESS NOTE Robert [name]  FMW:[id number] DOB: January 05, 1956 DOA: 02/02/2024 PCP: [facility], [facility]  Brief Narrative/Hospital Course: 68 y.o. 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 He was recently seen by orthopedics and had an MRI done 2 days before admission.  Neurosurgery Dr Onetha on board, placed on IV steroids and S/p  Rt L5 nerve root block plus transforaminal epidural steroid injection by IR 7/22  Subjective: Seen and examined Patient reports he still has pain, he is unchanged, able to work with PT OT Overnight afebrile stable on room air  Assessment and plan:  Severe low back pain w/ R sided radiculopathy and neuropathy: Improving symptoms. No bowel or bladder incontinence Symptoms include severe right sided back pain, left posterior thigh numbness and left calf burning sensation Prior L4-L5 fusion done long time back. Dr Onetha on board-placed on IV steroids and S/p  Rt L5 nerve root block plus transforaminal epidural steroid injection by IR 7/22 Continue steroid and wean, continue pain management muscle relaxant PT OT Plan for outpatient PT, he wants to see how the pain does before he gets home  Asthma Stable,Monitor    HTN BP well-controlled on Norvasc  5 mg PO daily, Avapro  300 mg PO daily    Hyperlipidemia: Cont Crestor  5 mg PO daily    DM type 2:  Stable on Novolog  SS ACHS    OSA  CPAP as able  Constipation:worsened in the setting of opioid use Cont  Colace 100 mg PO bid , Miralax  17 g PO daily    Class III obesity with adiposopathy symptoms: On Tirzepatide at home. Complicating care.  Will benefit patient with weight loss   Severe  left knee osteoarthritis: Needs left knee replacement but he needs to lose weight before he can undergo surgery He is on Tirzepatide at home.    Morbid Obesity w/ Body mass index is 43.79 kg/m.: Will benefit with PCP follow-up, weight loss,healthy lifestyle and outpatient sleep eval if not done.  DVT prophylaxis: SCDs Start: 02/02/24 2347 Code Status:   Code Status: Full Code Family Communication: plan of care discussed with patient at bedside. Patient status is: Remains hospitalized because of severity of illness Level of care: Med-Surg   Dispo: The patient is from: home            Anticipated disposition: TBD Objective: Vitals last 24 hrs: Vitals:   02/04/24 1609 02/04/24 1954 02/05/24 0452 02/05/24 0813  BP: 91/60 113/75 109/67 108/71  Pulse: 60 (!) 59 (!) 56 62  Resp: 18 18 16 18   Temp: 98 F (36.7 C) 97.9 F (36.6 C) 97.9 F (36.6 C) 98 F (36.7 C)  TempSrc: Oral Oral Oral   SpO2: 92% 95% 96% 95%  Weight:      Height:        Physical Examination: General exam: alert awake, older than stated age HEENT:Oral mucosa moist, Ear/Nose WNL grossly Respiratory system: Bilaterally clear BS, no use of accessory muscle Cardiovascular system: S1 & S2 +. Gastrointestinal system: Abdomen soft,  NT,ND,BS+ Nervous System: Alert, awake, following commands. Extremities: LE edema neg, warm extremities Skin: No rashes,warm. MSK: Normal muscle bulk/tone.   Data Reviewed: I have personally reviewed following labs and imaging studies ( see epic result tab) CBC: Recent Labs  Lab 02/02/24 1520 02/04/24 0653  WBC 7.3 10.1  NEUTROABS 5.8  --   HGB 15.3 15.1  HCT 43.2 42.7  MCV 87.3 87.3  PLT 177 173   CMP: Recent Labs  Lab 02/02/24 1520 02/04/24 0653  NA 137 133*  K 4.3 4.6  CL 105 102  CO2 24 23  GLUCOSE 124* 141*  BUN 27* 29*  CREATININE 0.79 0.85  CALCIUM  9.1 9.1  MG  --  2.1   GFR: Estimated Creatinine Clearance: 118.4 mL/min (by C-G formula based on SCr of 0.85  mg/dL). Recent Labs  Lab 02/04/24 0653  AST 16  ALT 19  ALKPHOS 55  BILITOT 0.8  PROT 6.2*  ALBUMIN 3.4*   No results for input(s): LIPASE, AMYLASE in the last 168 hours. No results for input(s): AMMONIA in the last 168 hours. Coagulation Profile:  Recent Labs  Lab 02/04/24 1300  INR 1.2   Unresulted Labs (From admission, onward)    None      Antimicrobials/Microbiology: Anti-infectives (From admission, onward)    None         Component Value Date/Time   SDES WOUND RIGHT TOE 04/11/2023 1725   SPECREQUEST MTJ PT ON ZOSYN  04/11/2023 1725   CULT  04/11/2023 1725    RARE STAPHYLOCOCCUS AUREUS WITHIN MIXED ORGANISMS NO ANAEROBES ISOLATED Performed at [facility], [address] [place], [place] [address]    REPTSTATUS 04/17/2023 FINAL 04/11/2023 1725  Medications reviewed:  Scheduled Meds:  amLODipine   5 mg Oral Daily   And   irbesartan   300 mg Oral Daily   dexamethasone  (DECADRON ) injection  4 mg Intravenous Q6H   docusate sodium   100 mg Oral BID   insulin  aspart  0-5 Units Subcutaneous QHS   insulin  aspart  0-9 Units Subcutaneous TID WC   lidocaine  (PF)  30 mL Intradermal Once   methylPREDNISolone  acetate  80 mg Intra-articular to XRAY   methylPREDNISolone  acetate  80 mg Intra-articular Once   polyethylene glycol  17 g Oral Daily   rosuvastatin   5 mg Oral Daily   Continuous Infusions:  Mennie LAMY, MD [facility] 02/05/2024, 1:11 PM

## 2024-02-05 NOTE — Hospital Course (Addendum)
 68 y.o. 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 He was recently seen by orthopedics and had an MRI done 2 days before admission.  Neurosurgery Dr Onetha on board, placed on IV steroids and S/p  Rt L5 nerve root block plus transforaminal epidural steroid injection by IR 7/22. No significant improvement despite epidural injection and planning for operative intervention By Dr. Onetha on 7/28> unfortunately surgery could not be completed patient had taken injection for diabetes that precludes him from anesthesia for 10 days planning for outpatient follow-up and rescheduling surgery coming Monday  Subjective: Seen examined Overnight afebrile BP stable Blood sugar 160s  Discharge diagnoses:  Severe low back pain w/ R sided radiculopathy and neuropathy Prior L4-L5 fusion Herniated nucleus pulposus L5-S1 with severe spinal stenosis L3-4, L5-S1: Dr Onetha on board-Pain secondary to severe stenosis above and below his fusion at L4-5 He is on iv Decadron  and also  S/P  Rt L5 nerve root block plus transforaminal epidural steroid injection by IR 02/04/24. Patient has failed conservative treatment and Dr Onetha is planning for surgical intervention 7/28  unfortunately surgery could not be completed patient had taken injection for diabetes that precludes him from anesthesia for 10 days planning for outpatient follow-up and rescheduling surgery coming Monday Continue on pain regimen and steroid  Asthma: Stable,Monitor    Hypotension episodes after opiate use HTN: Hypotension following opiate use x 1>given IV fluid bolus 7/24 - BP remains well-controlled. Cont to hold Norvasc , Avapro  and follow-up  outpatient   Hyperlipidemia: Cont Crestor  5 mg PO daily    DM type2:  Well-controlled on sliding scale insulin .     OSA  Continue home nasal CPAP qhs  Constipation: worsened in the setting of opioid use Cont Colace,  MiraLAX  twice daily   Class III obesity with adiposopathy symptoms: On Tirzepatide at home. Complicating care.  Will benefit patient with weight loss   Severe left knee osteoarthritis: Needs left knee replacement but he needs to lose weight before he can undergo surgery He is on Tirzepatide at home.   DVT prophylaxis: SCDs Start: 02/02/24 2347 Code Status:   Code Status: Full Code Family Communication: plan of care discussed with patient at bedside Patient status is: Remains hospitalized because of severity of illness Level of care: Med-Surg    Dispo: The patient is from: home            Anticipated disposition: home  Objective: Vitals last 24 hrs: Vitals:   02/10/24 1156 02/10/24 1630 02/10/24 1907 02/11/24 0425  BP: 125/75 (!) 120/56 (!) 97/57 119/67  Pulse: (!) 55 (!) 56 (!) 53 (!) 55  Resp: 18 16 16 16   Temp: 97.9 F (36.6 C) 97.8 F (36.6 C) 98.7 F (37.1 C) 97.8 F (36.6 C)  TempSrc:  Oral Oral Oral  SpO2: 97% 95% 95% 97%  Weight:  (!) 142 kg    Height:  5' 11 (1.803 m)      Physical Examination: General exam: alert awake, oriented x3 HEENT:Oral mucosa moist, Ear/Nose WNL grossly Respiratory system: Bilaterally clear BS,no use of accessory muscle Cardiovascular system: S1 & S2 +, No JVD. Gastrointestinal system: Abdomen soft,NT,ND, BS+ Nervous System: Alert, awake, moving all extremities, and nonfocal  Extremities: LE edema neg,distal peripheral pulses palpable and warm.  Skin: No rashes,no icterus. MSK: Normal muscle bulk,tone, power

## 2024-02-06 ENCOUNTER — Other Ambulatory Visit: Payer: Self-pay | Admitting: Neurosurgery

## 2024-02-06 DIAGNOSIS — M545 Low back pain, unspecified: Secondary | ICD-10-CM | POA: Diagnosis not present

## 2024-02-06 LAB — GLUCOSE, CAPILLARY
Glucose-Capillary: 181 mg/dL — ABNORMAL HIGH (ref 70–99)
Glucose-Capillary: 120 mg/dL — ABNORMAL HIGH (ref 70–99)
Glucose-Capillary: 231 mg/dL — ABNORMAL HIGH (ref 70–99)
Glucose-Capillary: 141 mg/dL — ABNORMAL HIGH (ref 70–99)

## 2024-02-06 MED ORDER — SODIUM CHLORIDE 0.9 % IV BOLUS
500.0000 mL | Freq: Once | INTRAVENOUS | Status: AC
  Administered 2024-02-06: 500 mL via INTRAVENOUS

## 2024-02-06 MED ORDER — OXYCODONE HCL 5 MG PO TABS
5.0000 mg | ORAL_TABLET | Freq: Four times a day (QID) | ORAL | Status: DC | PRN
  Administered 2024-02-06 – 2024-02-09 (×10): 10 mg via ORAL
  Administered 2024-02-09: 5 mg via ORAL
  Administered 2024-02-09 – 2024-02-11 (×5): 10 mg via ORAL
  Filled 2024-02-06: qty 2
  Filled 2024-02-06: qty 1
  Filled 2024-02-06 (×14): qty 2

## 2024-02-06 MED ORDER — METHOCARBAMOL 750 MG PO TABS
750.0000 mg | ORAL_TABLET | Freq: Three times a day (TID) | ORAL | Status: DC
  Administered 2024-02-06 – 2024-02-11 (×14): 750 mg via ORAL
  Filled 2024-02-06 (×14): qty 1

## 2024-02-06 MED ORDER — ACETAMINOPHEN 500 MG PO TABS
1000.0000 mg | ORAL_TABLET | Freq: Three times a day (TID) | ORAL | Status: DC
  Administered 2024-02-06 – 2024-02-11 (×16): 1000 mg via ORAL
  Filled 2024-02-06 (×16): qty 2

## 2024-02-06 MED ORDER — METHOCARBAMOL 500 MG PO TABS
1000.0000 mg | ORAL_TABLET | Freq: Three times a day (TID) | ORAL | Status: DC
  Administered 2024-02-06 (×2): 1000 mg via ORAL
  Filled 2024-02-06 (×2): qty 2

## 2024-02-06 MED ORDER — HYDROMORPHONE HCL 1 MG/ML IJ SOLN
0.5000 mg | INTRAMUSCULAR | Status: DC | PRN
  Administered 2024-02-06 – 2024-02-11 (×10): 0.5 mg via INTRAVENOUS
  Filled 2024-02-06 (×11): qty 0.5

## 2024-02-06 MED ORDER — HYDROMORPHONE HCL 1 MG/ML IJ SOLN
1.0000 mg | INTRAMUSCULAR | Status: DC | PRN
  Administered 2024-02-06: 1 mg via INTRAVENOUS
  Filled 2024-02-06: qty 1

## 2024-02-06 NOTE — Progress Notes (Signed)
 Physical Therapy Treatment Patient Details Name: Robert [name] MRN: [id number] DOB: 10/23/1955 Today's Date: 02/06/2024   History of Present Illness Robert [name] is a 68 y.o. male admitted 02/02/24 due to un retracting back pain: right-sided radicular symptoms x 5 days (trouble walking). MRI revealed moderately severe spinal stenosis at L3-4 above his 4 prior fusion significant right-sided L5-S1 compression at the L5-S1 disc space. NSG non-op management. PMH includes asthma, allergic rhinitis, chronic back pain, hypertension, hyperlipidemia, Barrett's esophagus, depression, type 2 diabetes, diabetic foot infections/osteomyelitis/toe amputations, OSA on CPAP    PT Comments  Patient is agreeable to PT. Increased ambulation distance and weight acceptance on RLE today. Standing activity tolerance is limited by right leg pain/numbness with increased activity. Cues for general spine precautions and logroll technique for comfort. Patient is hesitant about readiness for discharge home at this time. PT will continue to follow to maximize independence and decrease caregiver burden. He is now anticipated to have surgery Monday.    If plan is discharge home, recommend the following: Assistance with cooking/housework   Can travel by Garment/textile technologist  BSC/3in1;Other (comment)    Recommendations for Other Services       Precautions / Restrictions Precautions Precautions: Back Precaution Booklet Issued: No Restrictions Weight Bearing Restrictions Per Provider Order: No     Mobility  Bed Mobility Overal bed mobility: Needs Assistance Bed Mobility: Rolling, Sidelying to Sit, Sit to Sidelying Rolling: Modified independent (Device/Increase time) Sidelying to sit: Supervision     Sit to sidelying: Min assist General bed mobility comments: assistance intermittently to get legs in bed into sidelying position. reinforced logroll technique    Transfers Overall  transfer level: Needs assistance Equipment used: Rolling walker (2 wheels) Transfers: Sit to/from Stand Sit to Stand: Supervision                Ambulation/Gait Ambulation/Gait assistance: Contact guard assist Gait Distance (Feet): 115 Feet Assistive device: Rolling walker (2 wheels) Gait Pattern/deviations: Step-through pattern Gait velocity: decreased     General Gait Details: encouraged use of rolling walker for safety and fall prevention with correct sequencing demonstrated. patient is able to weight bear through RLE with ambulation, however has increasing right leg pain/posterior leg numbness with increased ambulation distance. encouraged to take standing/sitting rest breaks as needed for fall prevention   Stairs Stairs:  (educated patient on sequencing of BLE to get up/down steps safely using rails at home. patient verbalized understanding)           Wheelchair Mobility     Tilt Bed    Modified Rankin (Stroke Patients Only)       Balance Overall balance assessment: Needs assistance Sitting-balance support: Feet supported, No upper extremity supported Sitting balance-Leahy Scale: Fair     Standing balance support: Bilateral upper extremity supported Standing balance-Leahy Scale: Poor Standing balance comment: heavy reliance on rolling walker for support                            Communication Communication Communication: No apparent difficulties  Cognition Arousal: Alert Behavior During Therapy: WFL for tasks assessed/performed   PT - Cognitive impairments: No apparent impairments                         Following commands: Intact      Cueing Cueing Techniques: Verbal cues  Exercises  General Comments        Pertinent Vitals/Pain Pain Assessment Pain Assessment: Faces Faces Pain Scale: Hurts little more Pain Location: low back radiating down R leg Pain Descriptors / Indicators: Discomfort Pain  Intervention(s): Limited activity within patient's tolerance, Monitored during session, Premedicated before session, Repositioned    Home Living                          Prior Function            PT Goals (current goals can now be found in the care plan section) Acute Rehab PT Goals Patient Stated Goal: to get surgery on Monday and pain control PT Goal Formulation: With patient Time For Goal Achievement: 02/18/24 Potential to Achieve Goals: Good Progress towards PT goals: Progressing toward goals    Frequency    Min 2X/week      PT Plan      Co-evaluation              AM-PAC PT 6 Clicks Mobility   Outcome Measure  Help needed turning from your back to your side while in a flat bed without using bedrails?: A Little Help needed moving from lying on your back to sitting on the side of a flat bed without using bedrails?: A Little Help needed moving to and from a bed to a chair (including a wheelchair)?: A Little Help needed standing up from a chair using your arms (e.g., wheelchair or bedside chair)?: A Little Help needed to walk in hospital room?: A Little Help needed climbing 3-5 steps with a railing? : A Lot 6 Click Score: 17    End of Session   Activity Tolerance: Patient tolerated treatment well Patient left: in bed;with call bell/phone within reach   PT Visit Diagnosis: Unsteadiness on feet (R26.81);Other abnormalities of gait and mobility (R26.89);Pain Pain - Right/Left: Right Pain - part of body: Leg     Time: 8881-8861 PT Time Calculation (min) (ACUTE ONLY): 20 min  Charges:    $Therapeutic Activity: 8-22 mins PT General Charges $$ ACUTE PT VISIT: 1 Visit                     Randine Essex, PT, MPT    Randine LULLA Essex 02/06/2024, 11:54 AM

## 2024-02-06 NOTE — Progress Notes (Signed)
 PROGRESS NOTE Robert [name]  FMW:[id number] DOB: 1956/02/06 DOA: 02/02/2024 PCP: [facility]  Brief Narrative/Hospital Course: 68 y.o. 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 He was recently seen by orthopedics and had an MRI done 2 days before admission.  Neurosurgery Dr Onetha on board, placed on IV steroids and S/p  Rt L5 nerve root block plus transforaminal epidural steroid injection by IR 7/22.  Subjective: Seen and examined Still having pain States  Dr Onetha is going to do surgery on Monday Overnight afebrile BP stable, remains on steroid, lidocaine  patch and as needed Dilaudid  oxy for pain.  Assessment and plan:  Severe low back pain w/ R sided radiculopathy and neuropathy: Improving symptoms. No bowel or bladder incontinence Symptoms include severe right sided back pain, left posterior thigh numbness and left calf burning sensation Prior L4-L5 fusion done long time back.Dr Onetha on board-placed on IV steroids and S/p  Rt L5 nerve root block plus transforaminal epidural steroid injection by IR 02/04/24. It is secondary to severe stenosis above and below his fusion at L4-5 seems to have failed conservative treatment.  Continue steroid iv Pt reports NSG plans for Or Monday - he is concerned about going home > we will try to adjust to oral opiates not opiates muscle relaxants Added  tylenol  1gm tid robaxin  1g td and prn oxy 5-10 mg and hold off iv opiates.  If able to optimize pain on oral regimen he could potentially go home and come back for surgery PTOT recommends outpatient PT  Asthma: Stable,Monitor    HTN: BP well-controlled on Norvasc  5 mg PO  daily, Avapro  300 mg PO daily    Hyperlipidemia: Cont Crestor  5 mg PO daily    DM type2:  Stable on Novolog  SS ACHS    OSA  CPAP as able  Constipation:worsened in the setting of opioid use Cont  Colace 100 mg PO bid , Miralax  17 g PO daily    Class III obesity with adiposopathy symptoms: On Tirzepatide at home. Complicating care.  Will benefit patient with weight loss   Severe left knee osteoarthritis: Needs left knee replacement but he needs to lose weight before he can undergo surgery He is on Tirzepatide at home.    Morbid Obesity w/ Body mass index is 43.79 kg/m.: Will benefit with PCP follow-up, weight loss,healthy lifestyle and outpatient sleep eval if not done.  DVT prophylaxis: SCDs Start: 02/02/24 2347 Code Status:   Code Status: Full Code Family Communication: plan of care discussed with patient at bedside.  Discussed with wife on the phone Patient status is: Remains hospitalized because of severity of illness Level of care: Med-Surg   Dispo: The patient is from: home            Anticipated disposition: TBD pending pain improvement Objective: Vitals last 24 hrs: Vitals:   02/05/24 1612 02/05/24 2023 02/06/24 0445 02/06/24 0749  BP: 102/65 (!) 121/57 110/67 (!) 120/57  Pulse: (!) 59 72 60 (!) 59  Resp: 18 16 16 18   Temp: 97.7 F (36.5 C) 97.6 F (36.4 C) 97.6 F (36.4 C) 98 F (36.7 C)  TempSrc: Oral Oral Oral   SpO2: 96% 96% 96% 95%  Weight:      Height:  Physical Examination: General exam: alert awake, oriented at baseline, older than stated age HEENT:Oral mucosa moist, Ear/Nose WNL grossly Respiratory system: Bilaterally clear BS,no use of accessory muscle Cardiovascular system: S1 & S2 +, No JVD. Gastrointestinal system: Abdomen soft,NT,ND, BS+ Nervous System: Alert, awake, moving all extremities,and following commands. Extremities: LE edema neg,distal peripheral pulses palpable and warm.  Skin: No rashes,no icterus. MSK: Normal muscle  bulk,tone, power   Data Reviewed: I have personally reviewed following labs and imaging studies ( see epic result tab) CBC: Recent Labs  Lab 02/02/24 1520 02/04/24 0653  WBC 7.3 10.1  NEUTROABS 5.8  --   HGB 15.3 15.1  HCT 43.2 42.7  MCV 87.3 87.3  PLT 177 173   CMP: Recent Labs  Lab 02/02/24 1520 02/04/24 0653  NA 137 133*  K 4.3 4.6  CL 105 102  CO2 24 23  GLUCOSE 124* 141*  BUN 27* 29*  CREATININE 0.79 0.85  CALCIUM  9.1 9.1  MG  --  2.1   GFR: Estimated Creatinine Clearance: 118.4 mL/min (by C-G formula based on SCr of 0.85 mg/dL). Recent Labs  Lab 02/04/24 0653  AST 16  ALT 19  ALKPHOS 55  BILITOT 0.8  PROT 6.2*  ALBUMIN 3.4*   No results for input(s): LIPASE, AMYLASE in the last 168 hours. No results for input(s): AMMONIA in the last 168 hours. Coagulation Profile:  Recent Labs  Lab 02/04/24 1300  INR 1.2   Unresulted Labs (From admission, onward)    None      Antimicrobials/Microbiology: Anti-infectives (From admission, onward)    None         Component Value Date/Time   SDES WOUND RIGHT TOE 04/11/2023 1725   SPECREQUEST MTJ PT ON ZOSYN  04/11/2023 1725   CULT  04/11/2023 1725    RARE STAPHYLOCOCCUS AUREUS WITHIN MIXED ORGANISMS NO ANAEROBES ISOLATED Performed at [facility], [address], [place], [place] [address]    REPTSTATUS 04/17/2023 FINAL 04/11/2023 1725  Medications reviewed:  Scheduled Meds:  acetaminophen   1,000 mg Oral TID   amLODipine   5 mg Oral Daily   And   irbesartan   300 mg Oral Daily   dexamethasone  (DECADRON ) injection  4 mg Intravenous Q6H   docusate sodium   100 mg Oral BID   insulin  aspart  0-5 Units Subcutaneous QHS   insulin  aspart  0-9 Units Subcutaneous TID WC   lidocaine  (PF)  30 mL Intradermal Once   methocarbamol   1,000 mg Oral TID   methylPREDNISolone  acetate  80 mg Intra-articular Once   polyethylene glycol  17 g Oral Daily   rosuvastatin   5 mg Oral Daily   Continuous  Infusions:  Mennie LAMY, MD [facility] 02/06/2024, 8:58 AM

## 2024-02-06 NOTE — Plan of Care (Signed)

## 2024-02-06 NOTE — Progress Notes (Deleted)
 Subjective: Patient reports doing well condition of back pain no leg pain  Objective: Vital signs in last 24 hours: Temp:  [97.6 F (36.4 C)-98 F (36.7 C)] 98 F (36.7 C) (07/24 0749) Pulse Rate:  [59-72] 59 (07/24 0749) Resp:  [16-18] 18 (07/24 0749) BP: (102-121)/(57-71) 120/57 (07/24 0749) SpO2:  [95 %-96 %] 95 % (07/24 0749)  Intake/Output from previous day: No intake/output data recorded. Intake/Output this shift: No intake/output data recorded.  Awake alert strength 5 out of 5 and clean dry and intact  Lab Results: Recent Labs    02/04/24 0653  WBC 10.1  HGB 15.1  HCT 42.7  PLT 173   BMET Recent Labs    02/04/24 0653  NA 133*  K 4.6  CL 102  CO2 23  GLUCOSE 141*  BUN 29*  CREATININE 0.85  CALCIUM  9.1    Studies/Results: IR INJECT DIAG/THERA/INC NEEDLE/CATH/PLC EPI/LUMB/SAC W/IMG Result Date: 02/04/2024 CLINICAL DATA:  Previous lumbar fusion none L4-5. Severe right lower extremity radicular pain. EXAM: SELECTIVE NERVE ROOT BLOCK AND TRANSFORAMINAL EPIDURAL STEROID INJECTION UNDER FLUOROSCOPY FLUOROSCOPY: Radiation Exposure Index (as provided by the fluoroscopic device): 194.1 mGy air Kerma TECHNIQUE: An appropriate skin entry site was determined under fluoroscopy. Operator donned sterile gloves and mask. Site was marked, prepped with Betadine, draped in usual sterile fashion, infiltrated locally with 1% lidocaine . A 22 gauge 15 cm Chiba needle was advanced to the superior ventral margin of the right L5 - S1 neural foramen. Diagnostic injection of 2 ml Omnipaque  180 showed partial outlining of the exiting nerve root as well as epidural extension of contrast, with no intravascular or subarachnoid component. 80 mg Depo-Medrol  in 3 ml lidocaine  1% was administered. The patient tolerated procedure well, with no immediate complication. IMPRESSION: 1. Technically successful right L5 selective nerve root block and transforaminal epidural steroid injection Electronically  Signed   By: JONETTA Faes M.D.   On: 02/04/2024 16:29    Assessment/Plan: Postop day 1 lumbar fusion doing fairly well with significant proving preoperative leg pain still a lot of back pain is not void yet just had his Foley taken out mobilize today with physical therapy make sure he can void possible discharge later today or tomorrow  LOS: 3 days     Robert [name] 02/06/2024, 7:59 AM

## 2024-02-06 NOTE — Progress Notes (Signed)
 Subjective: Patient reports condition of severe right-sided back and right leg pain no significant improvement from epidural injection  Objective: Vital signs in last 24 hours: Temp:  [97.6 F (36.4 C)-98 F (36.7 C)] 98 F (36.7 C) (07/24 0749) Pulse Rate:  [59-72] 59 (07/24 0749) Resp:  [16-18] 18 (07/24 0749) BP: (102-121)/(57-71) 120/57 (07/24 0749) SpO2:  [95 %-96 %] 95 % (07/24 0749)  Intake/Output from previous day: No intake/output data recorded. Intake/Output this shift: No intake/output data recorded.  Patient is awake alert strength remains 5 out of 5 had 1 episode of incontinence today early this morning when pain got so severe and he was standing up.  Lab Results: Recent Labs    02/04/24 0653  WBC 10.1  HGB 15.1  HCT 42.7  PLT 173   BMET Recent Labs    02/04/24 0653  NA 133*  K 4.6  CL 102  CO2 23  GLUCOSE 141*  BUN 29*  CREATININE 0.85  CALCIUM  9.1    Studies/Results: IR INJECT DIAG/THERA/INC NEEDLE/CATH/PLC EPI/LUMB/SAC W/IMG Result Date: 02/04/2024 CLINICAL DATA:  Previous lumbar fusion none L4-5. Severe right lower extremity radicular pain. EXAM: SELECTIVE NERVE ROOT BLOCK AND TRANSFORAMINAL EPIDURAL STEROID INJECTION UNDER FLUOROSCOPY FLUOROSCOPY: Radiation Exposure Index (as provided by the fluoroscopic device): 194.1 mGy air Kerma TECHNIQUE: An appropriate skin entry site was determined under fluoroscopy. Operator donned sterile gloves and mask. Site was marked, prepped with Betadine, draped in usual sterile fashion, infiltrated locally with 1% lidocaine . A 22 gauge 15 cm Chiba needle was advanced to the superior ventral margin of the right L5 - S1 neural foramen. Diagnostic injection of 2 ml Omnipaque  180 showed partial outlining of the exiting nerve root as well as epidural extension of contrast, with no intravascular or subarachnoid component. 80 mg Depo-Medrol  in 3 ml lidocaine  1% was administered. The patient tolerated procedure well, with no  immediate complication. IMPRESSION: 1. Technically successful right L5 selective nerve root block and transforaminal epidural steroid injection Electronically Signed   By: JONETTA Faes M.D.   On: 02/04/2024 16:29    Assessment/Plan: 67-year gentleman with severe back and right hip and leg pain referable to severe stenosis above and below his fusion at L4-5 so seems to have failed conservative treatment so we have discussed operative correction plan would be potentially Monday afternoon to go for surgery in favor probably extending his fusion up and down doing a decompression interbody fusion at L3-4 as well as at L5-S1.  Whether the patient stays in the hospital before this or not is up to the patient how he feels from pain management perspective but will put him on the schedule for Monday afternoon  LOS: 3 days     Robert [name] 02/06/2024, 8:08 AM

## 2024-02-06 NOTE — Progress Notes (Signed)
 Occupational Therapy Treatment Patient Details Name: Robert [name] MRN: [id number] DOB: 1956/04/11 Today's Date: 02/06/2024   History of present illness Robert [name] is a 68 y.o. male admitted 02/02/24 due to un retracting back pain: right-sided radicular symptoms x 5 days (trouble walking). MRI revealed moderately severe spinal stenosis at L3-4 above his 4 prior fusion significant right-sided L5-S1 compression at the L5-S1 disc space. NSG non-op management. PMH includes asthma, allergic rhinitis, chronic back pain, hypertension, hyperlipidemia, Barrett's esophagus, depression, type 2 diabetes, diabetic foot infections/osteomyelitis/toe amputations, OSA on CPAP   OT comments  Assisted pt to shower in sitting with assist for back, hair and LEs, pt able to stand for pericare with supervision and grab bar. CGA to transfer in and out of shower and for ambulation with RW. Pt requiring pain meds at end of session, RN notified. Pt to have spinal surgery on Monday. Will continue to follow.       If plan is discharge home, recommend the following:  A little help with walking and/or transfers;A lot of help with bathing/dressing/bathroom;Assistance with cooking/housework;Assist for transportation;Help with stairs or ramp for entrance   Equipment Recommendations  BSC/3in1;Tub/shower bench    Recommendations for Other Services      Precautions / Restrictions Precautions Precautions: Back;Fall Precaution Booklet Issued: No Restrictions Weight Bearing Restrictions Per Provider Order: No       Mobility Bed Mobility Overal bed mobility: Modified Independent             General bed mobility comments: side to sit, sit to side    Transfers Overall transfer level: Needs assistance Equipment used: Rolling walker (2 wheels) Transfers: Sit to/from Stand Sit to Stand: Supervision           General transfer comment: from bed and shower seat     Balance Overall balance assessment: Needs  assistance Sitting-balance support: Feet supported, No upper extremity supported Sitting balance-Leahy Scale: Fair       Standing balance-Leahy Scale: Poor Standing balance comment: heavy reliance on rolling walker for support                           ADL either performed or assessed with clinical judgement   ADL Overall ADL's : Needs assistance/impaired         Upper Body Bathing: Minimal assistance;Sitting   Lower Body Bathing: Sit to/from stand;Maximal assistance   Upper Body Dressing : Set up;Bed level   Lower Body Dressing: Maximal assistance;Bed level       Toileting- Clothing Manipulation and Hygiene: Contact guard assist;Sit to/from stand       Functional mobility during ADLs: Contact guard assist;Rolling walker (2 wheels)      Extremity/Trunk Assessment              Vision       Perception     Praxis     Communication Communication Communication: No apparent difficulties   Cognition Arousal: Alert Behavior During Therapy: WFL for tasks assessed/performed Cognition: No apparent impairments                               Following commands: Intact        Cueing   Cueing Techniques: Verbal cues  Exercises      Shoulder Instructions       General Comments      Pertinent Vitals/ Pain  Pain Assessment Pain Assessment: Faces Faces Pain Scale: Hurts whole lot Pain Location: low back radiating down R leg Pain Descriptors / Indicators: Discomfort, Grimacing, Guarding Pain Intervention(s): Repositioned, Patient requesting pain meds-RN notified  Home Living                                          Prior Functioning/Environment              Frequency  Min 2X/week        Progress Toward Goals  OT Goals(current goals can now be found in the care plan section)  Progress towards OT goals: Progressing toward goals  Acute Rehab OT Goals OT Goal Formulation: With patient Time For  Goal Achievement: 02/18/24 Potential to Achieve Goals: Good  Plan      Co-evaluation                 AM-PAC OT 6 Clicks Daily Activity     Outcome Measure   Help from another person eating meals?: None Help from another person taking care of personal grooming?: A Little Help from another person toileting, which includes using toliet, bedpan, or urinal?: A Lot Help from another person bathing (including washing, rinsing, drying)?: A Lot Help from another person to put on and taking off regular upper body clothing?: A Little Help from another person to put on and taking off regular lower body clothing?: A Lot 6 Click Score: 16    End of Session Equipment Utilized During Treatment: Rolling walker (2 wheels)  OT Visit Diagnosis: Unsteadiness on feet (R26.81);Other abnormalities of gait and mobility (R26.89);Muscle weakness (generalized) (M62.81);Pain   Activity Tolerance Patient limited by pain   Patient Left in bed;with call bell/phone within reach;with nursing/sitter in room;with family/visitor present   Nurse Communication Other (comment);Patient requests pain meds (ok to assist pt with shower)        Time: 1515-1550 OT Time Calculation (min): 35 min  Charges: OT General Charges $OT Visit: 1 Visit OT Treatments $Self Care/Home Management : 23-37 mins  Mliss HERO, OTR/L Acute Rehabilitation Services Office: [phone]   Robert [name] 02/06/2024, 3:57 PM

## 2024-02-07 DIAGNOSIS — M545 Low back pain, unspecified: Secondary | ICD-10-CM | POA: Diagnosis not present

## 2024-02-07 LAB — GLUCOSE, CAPILLARY
Glucose-Capillary: 147 mg/dL — ABNORMAL HIGH (ref 70–99)
Glucose-Capillary: 172 mg/dL — ABNORMAL HIGH (ref 70–99)
Glucose-Capillary: 166 mg/dL — ABNORMAL HIGH (ref 70–99)
Glucose-Capillary: 126 mg/dL — ABNORMAL HIGH (ref 70–99)

## 2024-02-07 LAB — BASIC METABOLIC PANEL WITH GFR
Anion gap: 9 (ref 5–15)
BUN: 31 mg/dL — ABNORMAL HIGH (ref 8–23)
CO2: 21 mmol/L — ABNORMAL LOW (ref 22–32)
Calcium: 8.6 mg/dL — ABNORMAL LOW (ref 8.9–10.3)
Chloride: 103 mmol/L (ref 98–111)
Creatinine, Ser: 0.83 mg/dL (ref 0.61–1.24)
GFR, Estimated: >60 mL/min (ref >60)
Glucose, Bld: 142 mg/dL — ABNORMAL HIGH (ref 70–99)
Potassium: 4.3 mmol/L (ref 3.5–5.1)
Sodium: 133 mmol/L — ABNORMAL LOW (ref 135–145)

## 2024-02-07 LAB — CBC
HCT: 46.2 % (ref 39.0–52.0)
Hemoglobin: 16.5 g/dL (ref 13.0–17.0)
MCH: 30.7 pg (ref 26.0–34.0)
MCHC: 35.7 g/dL (ref 30.0–36.0)
MCV: 86 fL (ref 80.0–100.0)
Platelets: 175 K/uL (ref 150–400)
RBC: 5.37 MIL/uL (ref 4.22–5.81)
RDW: 13 % (ref 11.5–15.5)
WBC: 12.2 K/uL — ABNORMAL HIGH (ref 4.0–10.5)
nRBC: 0 % (ref 0.0–0.2)

## 2024-02-07 NOTE — Care Management Important Message (Signed)
 Important Message  Patient Details  Name: Robert [name] MRN: [id number] Date of Birth: [birth year]   Important Message Given:  Yes - Medicare IM  IM given on 7/24   Claretta Deed 02/07/2024, 8:35 AM

## 2024-02-07 NOTE — Plan of Care (Signed)

## 2024-02-07 NOTE — Progress Notes (Signed)
 Physical Therapy Treatment Patient Details Name: Oskar D [name] MRN: [id number] DOB: Mar 06, 1956 Today's Date: 02/07/2024   History of Present Illness Nocholas D [name] is a 68 y.o. male admitted 02/02/24 due to un retracting back pain: right-sided radicular symptoms x 5 days (trouble walking). MRI revealed moderately severe spinal stenosis at L3-4 above his 4 prior fusion significant right-sided L5-S1 compression at the L5-S1 disc space. NSG non-op management. PMH includes asthma, allergic rhinitis, chronic back pain, hypertension, hyperlipidemia, Barrett's esophagus, depression, type 2 diabetes, diabetic foot infections/osteomyelitis/toe amputations, OSA on CPAP    PT Comments  Patient is agreeable to PT session. He was premedicated but continues to have pain in the right leg that worsens with hallway ambulation. Encouraged continued use of rolling walker for safety with ambulation, maintaining general spine precautions and logroll technique for comfort. Recommend to continue PT to maximize independence and decrease caregiver burden.    If plan is discharge home, recommend the following: Assistance with cooking/housework;A little help with walking and/or transfers;A little help with bathing/dressing/bathroom   Can travel by private vehicle        Equipment Recommendations  BSC/3in1;Other (comment)    Recommendations for Other Services       Precautions / Restrictions Precautions Precautions: Back;Fall Precaution Booklet Issued: No Restrictions Weight Bearing Restrictions Per Provider Order: No     Mobility  Bed Mobility Overal bed mobility: Needs Assistance Bed Mobility: Sidelying to Sit, Sit to Sidelying   Sidelying to sit: Modified independent (Device/Increase time)     Sit to sidelying: Min assist General bed mobility comments: assistance required to lift BLE into bed due to right leg pain after walking. reinforced logroll technique    Transfers Overall transfer level: Needs  assistance Equipment used: Rolling walker (2 wheels) Transfers: Sit to/from Stand Sit to Stand: Supervision           General transfer comment: supervision for safety. 2 standing bouts performed    Ambulation/Gait Ambulation/Gait assistance: Contact guard assist Gait Distance (Feet):  (55ft, 53ft) Assistive device: Rolling walker (2 wheels) Gait Pattern/deviations: Step-through pattern Gait velocity: decreased     General Gait Details: standing rest break encouraged due to worsening right leg pain with mobility. emphasis on continued use of rolling walker for safety   Stairs             Wheelchair Mobility     Tilt Bed    Modified Rankin (Stroke Patients Only)       Balance Overall balance assessment: Needs assistance Sitting-balance support: Feet supported, No upper extremity supported Sitting balance-Leahy Scale: Fair     Standing balance support: Bilateral upper extremity supported Standing balance-Leahy Scale: Poor Standing balance comment: heavy reliance on rolling walker for support                            Communication Communication Communication: No apparent difficulties  Cognition Arousal: Alert Behavior During Therapy: WFL for tasks assessed/performed   PT - Cognitive impairments: No apparent impairments                         Following commands: Intact      Cueing Cueing Techniques: Verbal cues  Exercises      General Comments General comments (skin integrity, edema, etc.): reinforced logroll technique, general spine precuations, timing activity around pain medication schedule      Pertinent Vitals/Pain Pain Assessment Pain Assessment: Faces Faces Pain Scale: Hurts  little more Pain Location: low back radiating down R leg Pain Descriptors / Indicators: Discomfort, Grimacing, Guarding Pain Intervention(s): Limited activity within patient's tolerance, Repositioned, Premedicated before session, Monitored  during session    Home Living                          Prior Function            PT Goals (current goals can now be found in the care plan section) Acute Rehab PT Goals Patient Stated Goal: to get surgery on Monday and pain control PT Goal Formulation: With patient Time For Goal Achievement: 02/18/24 Potential to Achieve Goals: Good Progress towards PT goals: Progressing toward goals    Frequency    Min 2X/week      PT Plan      Co-evaluation              AM-PAC PT 6 Clicks Mobility   Outcome Measure  Help needed turning from your back to your side while in a flat bed without using bedrails?: A Little Help needed moving from lying on your back to sitting on the side of a flat bed without using bedrails?: A Little Help needed moving to and from a bed to a chair (including a wheelchair)?: A Little Help needed standing up from a chair using your arms (e.g., wheelchair or bedside chair)?: A Little Help needed to walk in hospital room?: A Little Help needed climbing 3-5 steps with a railing? : A Lot 6 Click Score: 17    End of Session   Activity Tolerance: Patient tolerated treatment well Patient left: in bed;with call bell/phone within reach   PT Visit Diagnosis: Unsteadiness on feet (R26.81);Other abnormalities of gait and mobility (R26.89);Pain Pain - Right/Left: Right Pain - part of body: Leg     Time: 9092-9063 PT Time Calculation (min) (ACUTE ONLY): 29 min  Charges:    $Therapeutic Activity: 23-37 mins PT General Charges $$ ACUTE PT VISIT: 1 Visit                     Randine Essex, PT, MPT    Randine LULLA Essex 02/07/2024, 9:52 AM

## 2024-02-07 NOTE — Progress Notes (Signed)
 Mobility Specialist Progress Note:    02/07/24 1459  Mobility  Activity Ambulated with assistance in hallway  Level of Assistance Contact guard assist, steadying assist  Assistive Device Front wheel walker  Distance Ambulated (ft) 200 ft  Activity Response Tolerated well  Mobility Referral Yes  Mobility visit 1 Mobility  Mobility Specialist Start Time (ACUTE ONLY) 1349  Mobility Specialist Stop Time (ACUTE ONLY) 1359  Mobility Specialist Time Calculation (min) (ACUTE ONLY) 10 min   Received pt in bed and agreeable to mobility. No physical assistance needed. No c/o. Returned to room without fault. Left pt in bed with personal belongings and call light within reach. All needs met.  Lavanda Pollack Mobility Specialist  Please contact via [employer] or  Rehab Office [phone]

## 2024-02-07 NOTE — Progress Notes (Signed)
 PROGRESS NOTE Robert [name]  FMW:[id number] DOB: 07/14/1956 DOA: 02/02/2024 PCP: [facility]  Brief Narrative/Hospital Course: 68 y.o. 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 He was recently seen by orthopedics and had an MRI done 2 days before admission.  Neurosurgery Dr Onetha on board, placed on IV steroids and S/p  Rt L5 nerve root block plus transforaminal epidural steroid injection by IR 7/22. No significant improvement despite epidural injection and planning for operative intervention By Dr. Onetha on 7/28  Subjective: Seen examined Pain ok.  I will stay and get surgery Monday Overnight afebrile BP stable he had episode of hypotension following opiate use> antihypertensive stopped and given IV fluid bolus 7/24   Assessment and plan:  Severe low back pain w/ R sided radiculopathy and neuropathy: Improving symptoms. No bowel or bladder incontinence Symptoms include severe right sided back pain, left posterior thigh numbness and left calf burning sensation Prior L4-L5 fusion done long time back.Dr Onetha on board-placed on IV steroids and S/p  Rt L5 nerve root block plus transforaminal epidural steroid injection by IR 02/04/24. It is secondary to severe stenosis above and below his fusion at L4-5 seems to have failed conservative treatment. Dr Onetha plans for OR Monday  Cont scheduled Tylenol  with Robaxin  and  PRN  Oxycodone  5-10 mg and minimize iv opiates. Cont PTOT recommends outpatient PT  Asthma: Stable,Monitor    Hypotension episodes after opiate use HTN: he had episode of hypotension following opiate use> antihypertensive stopped and given IV  fluid bolus 7/24  Cont to hold Norvasc , Avapro     Hyperlipidemia: Cont Crestor  5 mg PO daily    DM type2:  Stable on Novolog  SS ACHS    OSA  CPAP as able  Constipation:worsened in the setting of opioid use Cont  Colace 100 mg PO bid , Miralax  17 g PO daily    Class III obesity with adiposopathy symptoms: On Tirzepatide at home. Complicating care.  Will benefit patient with weight loss   Severe left knee osteoarthritis: Needs left knee replacement but he needs to lose weight before he can undergo surgery He is on Tirzepatide at home.   DVT prophylaxis: SCDs Start: 02/02/24 2347 Code Status:   Code Status: Full Code Family Communication: plan of care discussed with patient at bedside.  Discussed with wife on the phone 7/23 Patient status is: Remains hospitalized because of severity of illness Level of care: Med-Surg    Dispo: The patient is from: home            Anticipated disposition: TBD  need Or Monday  Objective: Vitals last 24 hrs: Vitals:   02/06/24 1949 02/07/24 0009 02/07/24 0442 02/07/24 0746  BP: 114/67 107/67 102/70 108/67  Pulse: (!) 59 64 (!) 51 (!) 53  Resp: 17  16   Temp: 98.5 F (36.9 C)  98.2 F (36.8 C) 98.3 F (36.8 C)  TempSrc: Oral   Oral  SpO2: 95%  97% 97%  Weight:      Height:        Physical Examination: General exam: alert awake,obese, oriented HEENT:Oral mucosa moist, Ear/Nose WNL grossly Respiratory system: Bilaterally clear BS,no use of accessory muscle Cardiovascular system: S1 & S2 +, No JVD. Gastrointestinal system: Abdomen soft,NT,ND, BS+ Nervous  System: Alert, awake, moving all extremities,and following commands. Extremities: LE edema neg,distal peripheral pulses palpable and warm.  Skin: No rashes,no icterus. MSK: Normal muscle bulk,tone, power    Data Reviewed: I have personally reviewed following labs and imaging studies ( see epic result tab) CBC: Recent Labs  Lab 02/02/24 1520 02/04/24 0653 02/07/24 0456  WBC 7.3  10.1 12.2*  NEUTROABS 5.8  --   --   HGB 15.3 15.1 16.5  HCT 43.2 42.7 46.2  MCV 87.3 87.3 86.0  PLT 177 173 175   CMP: Recent Labs  Lab 02/02/24 1520 02/04/24 0653 02/07/24 0456  NA 137 133* 133*  K 4.3 4.6 4.3  CL 105 102 103  CO2 24 23 21*  GLUCOSE 124* 141* 142*  BUN 27* 29* 31*  CREATININE 0.79 0.85 0.83  CALCIUM  9.1 9.1 8.6*  MG  --  2.1  --    GFR: Estimated Creatinine Clearance: 121.3 mL/min (by C-G formula based on SCr of 0.83 mg/dL). Recent Labs  Lab 02/04/24 0653  AST 16  ALT 19  ALKPHOS 55  BILITOT 0.8  PROT 6.2*  ALBUMIN 3.4*   No results for input(s): LIPASE, AMYLASE in the last 168 hours. No results for input(s): AMMONIA in the last 168 hours. Coagulation Profile:  Recent Labs  Lab 02/04/24 1300  INR 1.2   Unresulted Labs (From admission, onward)    None      Antimicrobials/Microbiology: Anti-infectives (From admission, onward)    None         Component Value Date/Time   SDES WOUND RIGHT TOE 04/11/2023 1725   SPECREQUEST MTJ PT ON ZOSYN  04/11/2023 1725   CULT  04/11/2023 1725    RARE STAPHYLOCOCCUS AUREUS WITHIN MIXED ORGANISMS NO ANAEROBES ISOLATED Performed at [facility], [address] [place], [place] [address]    REPTSTATUS 04/17/2023 FINAL 04/11/2023 1725  Medications reviewed:  Scheduled Meds:  acetaminophen   1,000 mg Oral TID   dexamethasone  (DECADRON ) injection  4 mg Intravenous Q6H   docusate sodium   100 mg Oral BID   insulin  aspart  0-5 Units Subcutaneous QHS   insulin  aspart  0-9 Units Subcutaneous TID WC   lidocaine  (PF)  30 mL Intradermal Once   methocarbamol   750 mg Oral TID   methylPREDNISolone  acetate  80 mg Intra-articular Once   rosuvastatin   5 mg Oral Daily   Continuous Infusions:  Mennie LAMY, MD [facility] 02/07/2024, 11:13 AM

## 2024-02-07 NOTE — Plan of Care (Signed)

## 2024-02-08 DIAGNOSIS — M545 Low back pain, unspecified: Secondary | ICD-10-CM | POA: Diagnosis not present

## 2024-02-08 LAB — GLUCOSE, CAPILLARY
Glucose-Capillary: 128 mg/dL — ABNORMAL HIGH (ref 70–99)
Glucose-Capillary: 119 mg/dL — ABNORMAL HIGH (ref 70–99)
Glucose-Capillary: 134 mg/dL — ABNORMAL HIGH (ref 70–99)
Glucose-Capillary: 162 mg/dL — ABNORMAL HIGH (ref 70–99)

## 2024-02-08 MED ORDER — POLYETHYLENE GLYCOL 3350 17 G PO PACK
17.0000 g | PACK | Freq: Every day | ORAL | Status: DC | PRN
  Administered 2024-02-08: 17 g via ORAL

## 2024-02-08 NOTE — Progress Notes (Signed)
 PROGRESS NOTE Robert [name]  FMW:[id number] DOB: 1956/06/22 DOA: 02/02/2024 PCP: [facility]  Brief Narrative/Hospital Course: 68 y.o. 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 He was recently seen by orthopedics and had an MRI done 2 days before admission.  Neurosurgery Dr Onetha on board, placed on IV steroids and S/p  Rt L5 nerve root block plus transforaminal epidural steroid injection by IR 7/22. No significant improvement despite epidural injection and planning for operative intervention By Dr. Onetha on 7/28  Subjective: Patient seen examined Pain is controlled no complaints History sleeping this morning with nasal CPAP on Overnight heart rate in 50s BP 90s-120s on room air Blood sugar well-controlled 120s  Assessment and plan:  Severe low back pain w/ R sided radiculopathy and neuropathy Prior L4-L5 fusion. Dr Onetha on board-placed on IV steroids and S/p  Rt L5 nerve root block plus transforaminal epidural steroid injection by IR 02/04/24.per neurosurgery presentation is secondary to severe stenosis above and below his fusion at L4-5 seems to have failed conservative treatment. Dr Onetha plans for OR Monday  Cont scheduled Tylenol  with Robaxin  and  PRN  Oxycodone  5-10 mg and minimize iv opiates. Cont PTOT recommends outpatient PT  Asthma: Stable,Monitor    Hypotension episodes after opiate use HTN: he had episode of hypotension following opiate use> antihypertensive stopped and given IV fluid bolus 7/24 -stable Cont to hold Norvasc , Avapro     Hyperlipidemia: Cont Crestor  5 mg PO daily    DM type2:  Well-controlled on sliding scale insulin      OSA  Continue home nasal CPAP qhs  Constipation:worsened in the setting of opioid use Cont  Colace 100 mg PO bid , Miralax  17 g PO daily    Class III obesity with adiposopathy symptoms: On Tirzepatide at home. Complicating care.  Will benefit patient with weight loss   Severe left knee osteoarthritis: Needs left knee replacement but he needs to lose weight before he can undergo surgery He is on Tirzepatide at home.   DVT prophylaxis: SCDs Start: 02/02/24 2347 Code Status:   Code Status: Full Code Family Communication: plan of care discussed with patient at bedside.Discussed with wife on the phone 7/23 Patient status is: Remains hospitalized because of severity of illness Level of care: Med-Surg    Dispo: The patient is from: home            Anticipated disposition: TBD needs surgery Monday  Objective: Vitals last 24 hrs: Vitals:   02/07/24 1730 02/07/24 2015 02/08/24 0405 02/08/24 0733  BP: (!) (P) 119/56 104/61 121/78 (!) 96/56  Pulse: (P) 60 (!) 56 (!) 57 (!) 52  Resp:  16 16 18   Temp: (P) 98.8 F (37.1 C) 98.4 F (36.9 C) 97.9 F (36.6 C) 98 F (36.7 C)  TempSrc:  Oral Oral   SpO2: (P) 98% 95% 95% 98%  Weight:      Height:        Physical Examination: General exam: alert awake, pleasant obese.  HEENT:Oral mucosa moist, Ear/Nose WNL grossly Respiratory system: Bilaterally clear BS,no use of accessory muscle Cardiovascular system: S1 & S2 +, No JVD. Gastrointestinal system: Abdomen soft,NT,ND, BS+ Nervous System: Alert, awake, moving all extremities,and following commands. Extremities: LE edema neg,distal peripheral pulses palpable and warm.  Skin: No rashes,no icterus. MSK: Normal muscle bulk,tone, power    Data Reviewed: I have personally reviewed following labs and imaging studies ( see epic result tab) CBC: Recent Labs  Lab 02/02/24 1520 02/04/24 0653 02/07/24 0456  WBC 7.3 10.1 12.2*  NEUTROABS 5.8  --   --   HGB 15.3 15.1 16.5  HCT 43.2 42.7 46.2   MCV 87.3 87.3 86.0  PLT 177 173 175   CMP: Recent Labs  Lab 02/02/24 1520 02/04/24 0653 02/07/24 0456  NA 137 133* 133*  K 4.3 4.6 4.3  CL 105 102 103  CO2 24 23 21*  GLUCOSE 124* 141* 142*  BUN 27* 29* 31*  CREATININE 0.79 0.85 0.83  CALCIUM  9.1 9.1 8.6*  MG  --  2.1  --    GFR: Estimated Creatinine Clearance: 121.3 mL/min (by C-G formula based on SCr of 0.83 mg/dL). Recent Labs  Lab 02/04/24 0653  AST 16  ALT 19  ALKPHOS 55  BILITOT 0.8  PROT 6.2*  ALBUMIN 3.4*   No results for input(s): LIPASE, AMYLASE in the last 168 hours. No results for input(s): AMMONIA in the last 168 hours. Coagulation Profile:  Recent Labs  Lab 02/04/24 1300  INR 1.2   Unresulted Labs (From admission, onward)    None      Antimicrobials/Microbiology: Anti-infectives (From admission, onward)    None         Component Value Date/Time   SDES WOUND RIGHT TOE 04/11/2023 1725   SPECREQUEST MTJ PT ON ZOSYN  04/11/2023 1725   CULT  04/11/2023 1725    RARE STAPHYLOCOCCUS AUREUS WITHIN MIXED ORGANISMS NO ANAEROBES ISOLATED Performed at [facility], [address], [place], [place] [address]    REPTSTATUS 04/17/2023 FINAL 04/11/2023 1725  Medications reviewed:  Scheduled Meds:  acetaminophen   1,000 mg Oral TID   dexamethasone  (DECADRON ) injection  4 mg Intravenous Q6H   docusate sodium   100 mg Oral BID   insulin  aspart  0-5 Units Subcutaneous QHS   insulin  aspart  0-9 Units Subcutaneous TID WC   lidocaine  (PF)  30 mL Intradermal Once   methocarbamol   750 mg Oral TID   methylPREDNISolone  acetate  80 mg Intra-articular Once   rosuvastatin   5 mg Oral Daily   Continuous Infusions:  Mennie LAMY, MD [facility] 02/08/2024, 10:55 AM

## 2024-02-08 NOTE — Progress Notes (Signed)
 Subjective: The patient is alert and pleasant.  Objective: Vital signs in last 24 hours: Temp:  [97.9 F (36.6 C)-98.8 F (37.1 C)] 98 F (36.7 C) (07/26 0733) Pulse Rate:  [52-60] 52 (07/26 0733) Resp:  [16-18] 18 (07/26 0733) BP: (96-121)/(56-78) 96/56 (07/26 0733) SpO2:  [95 %-98 %] 98 % (07/26 0733) Estimated body mass index is 43.79 kg/m as calculated from the following:   Height as of this encounter: 5' 11 (1.803 m).   Weight as of this encounter: 142.4 kg.   Intake/Output from previous day: 07/25 0701 - 07/26 0700 In: -  Out: 2100 [Urine:2100] Intake/Output this shift: No intake/output data recorded.  Physical exam the patient is alert and oriented.  He is moving all 4 extremities.  Lab Results: Recent Labs    02/07/24 0456  WBC 12.2*  HGB 16.5  HCT 46.2  PLT 175   BMET Recent Labs    02/07/24 0456  NA 133*  K 4.3  CL 103  CO2 21*  GLUCOSE 142*  BUN 31*  CREATININE 0.83  CALCIUM  8.6*    Studies/Results: No results found.  Assessment/Plan: The plan is for a extension of his lumbar fusion on Monday by Dr. Onetha.  I have answered all his questions.  LOS: 5 days     Robert [name] 02/08/2024, 8:34 AM     Patient ID: Robert [name], male   DOB: 05-17-1956, 68 y.o.   MRN: [id number]

## 2024-02-08 NOTE — Progress Notes (Signed)
 Mobility Specialist: Progress Note   02/08/24 1344  Mobility  Activity Ambulated with assistance in hallway  Level of Assistance Standby assist, set-up cues, supervision of patient - no hands on  Assistive Device Front wheel walker  Distance Ambulated (ft) 150 ft  Activity Response Tolerated well  Mobility Referral Yes  Mobility visit 1 Mobility  Mobility Specialist Start Time (ACUTE ONLY) 1300  Mobility Specialist Stop Time (ACUTE ONLY) 1311  Mobility Specialist Time Calculation (min) (ACUTE ONLY) 11 min    Pt received in bed, agreeable to mobility session. SV throughout. C/o RLE pain but still good tolerance for WB this session. Returned to room without fault. Left in bed with all needs met, call bell in reach.   Ileana Lute Mobility Specialist Please contact via SecureChat or Rehab office at [phone]

## 2024-02-09 DIAGNOSIS — M545 Low back pain, unspecified: Secondary | ICD-10-CM | POA: Diagnosis not present

## 2024-02-09 LAB — GLUCOSE, CAPILLARY
Glucose-Capillary: 188 mg/dL — ABNORMAL HIGH (ref 70–99)
Glucose-Capillary: 157 mg/dL — ABNORMAL HIGH (ref 70–99)
Glucose-Capillary: 206 mg/dL — ABNORMAL HIGH (ref 70–99)
Glucose-Capillary: 162 mg/dL — ABNORMAL HIGH (ref 70–99)

## 2024-02-09 MED ORDER — POLYETHYLENE GLYCOL 3350 17 G PO PACK
17.0000 g | PACK | Freq: Two times a day (BID) | ORAL | Status: DC
  Administered 2024-02-09 – 2024-02-11 (×3): 17 g via ORAL
  Filled 2024-02-09 (×4): qty 1

## 2024-02-09 NOTE — Plan of Care (Signed)
   Problem: Nutrition: Goal: Adequate nutrition will be maintained Outcome: Progressing   Problem: Coping: Goal: Level of anxiety will decrease Outcome: Progressing   Problem: Elimination: Goal: Will not experience complications related to bowel motility Outcome: Progressing Goal: Will not experience complications related to urinary retention Outcome: Progressing

## 2024-02-09 NOTE — Plan of Care (Signed)
   Problem: Education: Goal: Knowledge of General Education information will improve Description Including pain rating scale, medication(s)/side effects and non-pharmacologic comfort measures Outcome: Progressing   Problem: Clinical Measurements: Goal: Ability to maintain clinical measurements within normal limits will improve Outcome: Progressing   Problem: Clinical Measurements: Goal: Will remain free from infection Outcome: Progressing

## 2024-02-09 NOTE — Progress Notes (Signed)
 PROGRESS NOTE Robert [name]  FMW:[id number] DOB: 1956/05/15 DOA: 02/02/2024 PCP: [facility]  Brief Narrative/Hospital Course: 68 y.o. 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 He was recently seen by orthopedics and had an MRI done 2 days before admission.  Neurosurgery Dr Onetha on board, placed on IV steroids and S/p  Rt L5 nerve root block plus transforaminal epidural steroid injection by IR 7/22. No significant improvement despite epidural injection and planning for operative intervention By Dr. Onetha on 7/28  Subjective: Patient seen and examined No numbness tingling able to move lower extremities pain is controlled Requesting MiraLAX  Overnight heart rate 50s-60s BP soft 90s-105, on room air Blood sugar 130s-16-s Remains on as needed IV Dilaudid  along with oral oxy, bowel regimen  Assessment and plan:  Severe low back pain w/ R sided radiculopathy and neuropathy Prior L4-L5 fusion. Dr Onetha on board-placed on IV steroids and S/p  Rt L5 nerve root block plus transforaminal epidural steroid injection by IR 02/04/24.per neurosurgery presentation is secondary to severe stenosis above and below his fusion at L4-5 seems to have failed conservative treatment.  Dr Onetha is planning for surgical intervention 7/28  Cont scheduled Tylenol  with Robaxin  and  PRN  Oxycodone  5-10 mg and minimize iv opiates. Cont PTOT  Asthma: Stable,Monitor    Hypotension episodes after opiate use HTN: Hypotension following opiate use x 1>given IV fluid bolus 7/24 - BP soft. Cont to hold Norvasc , Avapro     Hyperlipidemia: Cont Crestor  5 mg PO daily    DM type2:   Well-controlled on sliding scale insulin .  Monitor closely while on IV Decadron    OSA  Continue home nasal CPAP qhs  Constipation:worsened in the setting of opioid use Cont Colace, will make MiraLAX  twice daily   Class III obesity with adiposopathy symptoms: On Tirzepatide at home. Complicating care.  Will benefit patient with weight loss   Severe left knee osteoarthritis: Needs left knee replacement but he needs to lose weight before he can undergo surgery He is on Tirzepatide at home.   DVT prophylaxis: SCDs Start: 02/02/24 2347 Code Status:   Code Status: Full Code Family Communication: plan of care discussed with patient at bedside.Discussed with wife on the phone 7/23 Patient status is: Remains hospitalized because of severity of illness Level of care: Med-Surg    Dispo: The patient is from: home            Anticipated disposition: TBD needs surgery Monday  Objective: Vitals last 24 hrs: Vitals:   02/08/24 2000 02/08/24 2110 02/09/24 0720 02/09/24 0941  BP: (!) 92/59 (!) 105/49 (!) 96/56 122/70  Pulse: (!) 55 (!) 54 (!) 48 (!) 56  Resp: 16 16 18 16   Temp: 98.7 F (37.1 C)  98.2 F (36.8 C) 98.6 F (37 C)  TempSrc: Oral  Oral Oral  SpO2: 95% 98% 94% 98%  Weight:      Height:        Physical Examination: General exam: alert awake, oriented , pleasant conversant HEENT:Oral mucosa moist, Ear/Nose WNL grossly Respiratory system: Bilaterally clear BS,no use of accessory muscle Cardiovascular system: S1 & S2 +, No JVD. Gastrointestinal system: Abdomen soft,NT,ND, BS+ Nervous System: Alert, awake, moving all extremities,and following commands. Extremities: LE edema neg,distal peripheral pulses palpable  and warm.  Skin: No rashes,no icterus. MSK: Normal muscle bulk,tone, power    Data Reviewed: I have personally reviewed following labs and imaging studies ( see epic result tab) CBC: Recent Labs  Lab 02/02/24 1520 02/04/24 0653 02/07/24 0456  WBC 7.3 10.1 12.2*   NEUTROABS 5.8  --   --   HGB 15.3 15.1 16.5  HCT 43.2 42.7 46.2  MCV 87.3 87.3 86.0  PLT 177 173 175   CMP: Recent Labs  Lab 02/02/24 1520 02/04/24 0653 02/07/24 0456  NA 137 133* 133*  K 4.3 4.6 4.3  CL 105 102 103  CO2 24 23 21*  GLUCOSE 124* 141* 142*  BUN 27* 29* 31*  CREATININE 0.79 0.85 0.83  CALCIUM  9.1 9.1 8.6*  MG  --  2.1  --    GFR: Estimated Creatinine Clearance: 121.3 mL/min (by C-G formula based on SCr of 0.83 mg/dL). Recent Labs  Lab 02/04/24 0653  AST 16  ALT 19  ALKPHOS 55  BILITOT 0.8  PROT 6.2*  ALBUMIN 3.4*   No results for input(s): LIPASE, AMYLASE in the last 168 hours. No results for input(s): AMMONIA in the last 168 hours. Coagulation Profile:  Recent Labs  Lab 02/04/24 1300  INR 1.2   Unresulted Labs (From admission, onward)    None      Antimicrobials/Microbiology: Anti-infectives (From admission, onward)    None         Component Value Date/Time   SDES WOUND RIGHT TOE 04/11/2023 1725   SPECREQUEST MTJ PT ON ZOSYN  04/11/2023 1725   CULT  04/11/2023 1725    RARE STAPHYLOCOCCUS AUREUS WITHIN MIXED ORGANISMS NO ANAEROBES ISOLATED Performed at [facility], [address], [place], [place] [address]    REPTSTATUS 04/17/2023 FINAL 04/11/2023 1725  Medications reviewed:  Scheduled Meds:  acetaminophen   1,000 mg Oral TID   dexamethasone  (DECADRON ) injection  4 mg Intravenous Q6H   docusate sodium   100 mg Oral BID   insulin  aspart  0-5 Units Subcutaneous QHS   insulin  aspart  0-9 Units Subcutaneous TID WC   lidocaine  (PF)  30 mL Intradermal Once   methocarbamol   750 mg Oral TID   polyethylene glycol  17 g Oral BID   rosuvastatin   5 mg Oral Daily   Continuous Infusions:  Mennie LAMY, MD [facility] 02/09/2024, 10:43 AM

## 2024-02-09 NOTE — Progress Notes (Signed)
 2 hour V/S recheck yields greens MEWS BP recheck: 122/70, HR 56   02/09/24 0720  Assess: MEWS Score  Temp 98.2 F (36.8 C)  BP (!) 96/56  MAP (mmHg) 69  Pulse Rate (!) 48  Resp 18  SpO2 94 %  O2 Device Room Air  Assess: MEWS Score  MEWS Temp 0  MEWS Systolic 1  MEWS Pulse 1  MEWS RR 0  MEWS LOC 0  MEWS Score 2  MEWS Score Color Yellow  Assess: if the MEWS score is Yellow or Red  Were vital signs accurate and taken at a resting state? Yes  Does the patient meet 2 or more of the SIRS criteria? No  MEWS guidelines implemented  Yes, yellow  Treat  MEWS Interventions Considered administering scheduled or prn medications/treatments as ordered  Take Vital Signs  Increase Vital Sign Frequency  Yellow: Q2hr x1, continue Q4hrs until patient remains green for 12hrs  Escalate  MEWS: Escalate Yellow: Discuss with charge nurse and consider notifying provider and/or RRT  Notify: Charge Nurse/RN  Name of Charge Nurse/RN Notified Antoinette, RN  Assess: SIRS CRITERIA  SIRS Temperature  0  SIRS Respirations  0  SIRS Pulse 0  SIRS WBC 0  SIRS Score Sum  0

## 2024-02-10 ENCOUNTER — Inpatient Hospital Stay (HOSPITAL_COMMUNITY)

## 2024-02-10 ENCOUNTER — Encounter (HOSPITAL_COMMUNITY): Payer: Self-pay | Admitting: Internal Medicine

## 2024-02-10 ENCOUNTER — Encounter (HOSPITAL_COMMUNITY): Admitting: Anesthesiology

## 2024-02-10 ENCOUNTER — Encounter (HOSPITAL_COMMUNITY)
Admission: EM | Disposition: A | Discharge status: Home Health | Payer: Self-pay | Source: Home / Self Care | Attending: Internal Medicine

## 2024-02-10 DIAGNOSIS — M545 Low back pain, unspecified: Secondary | ICD-10-CM | POA: Diagnosis not present

## 2024-02-10 LAB — CBC
HCT: 45.8 % (ref 39.0–52.0)
Hemoglobin: 16.5 g/dL (ref 13.0–17.0)
MCH: 30.8 pg (ref 26.0–34.0)
MCHC: 36 g/dL (ref 30.0–36.0)
MCV: 85.6 fL (ref 80.0–100.0)
Platelets: 159 K/uL (ref 150–400)
RBC: 5.35 MIL/uL (ref 4.22–5.81)
RDW: 13.1 % (ref 11.5–15.5)
WBC: 10.9 K/uL — ABNORMAL HIGH (ref 4.0–10.5)
nRBC: 0 % (ref 0.0–0.2)

## 2024-02-10 LAB — TYPE AND SCREEN
ABO/RH(D): O POS
Antibody Screen: NEGATIVE

## 2024-02-10 LAB — GLUCOSE, CAPILLARY
Glucose-Capillary: 116 mg/dL — ABNORMAL HIGH (ref 70–99)
Glucose-Capillary: 145 mg/dL — ABNORMAL HIGH (ref 70–99)
Glucose-Capillary: 116 mg/dL — ABNORMAL HIGH (ref 70–99)
Glucose-Capillary: 113 mg/dL — ABNORMAL HIGH (ref 70–99)
Glucose-Capillary: 161 mg/dL — ABNORMAL HIGH (ref 70–99)

## 2024-02-10 LAB — BASIC METABOLIC PANEL WITH GFR
Anion gap: 7 (ref 5–15)
BUN: 29 mg/dL — ABNORMAL HIGH (ref 8–23)
CO2: 23 mmol/L (ref 22–32)
Calcium: 8.3 mg/dL — ABNORMAL LOW (ref 8.9–10.3)
Chloride: 102 mmol/L (ref 98–111)
Creatinine, Ser: 0.77 mg/dL (ref 0.61–1.24)
GFR, Estimated: >60 mL/min (ref >60)
Glucose, Bld: 127 mg/dL — ABNORMAL HIGH (ref 70–99)
Potassium: 4.3 mmol/L (ref 3.5–5.1)
Sodium: 132 mmol/L — ABNORMAL LOW (ref 135–145)

## 2024-02-10 LAB — SURGICAL PCR SCREEN
MRSA, PCR: NEGATIVE
Staphylococcus aureus: POSITIVE — AB

## 2024-02-10 SURGERY — POSTERIOR LUMBAR FUSION 2 LEVEL
Anesthesia: General | Site: Back

## 2024-02-10 MED ORDER — ROCURONIUM BROMIDE 10 MG/ML (PF) SYRINGE
PREFILLED_SYRINGE | INTRAVENOUS | Status: AC
  Filled 2024-02-10: qty 10

## 2024-02-10 MED ORDER — ZOLPIDEM TARTRATE 5 MG PO TABS
5.0000 mg | ORAL_TABLET | Freq: Once | ORAL | Status: AC
  Administered 2024-02-10: 5 mg via ORAL
  Filled 2024-02-10: qty 1

## 2024-02-10 MED ORDER — CHLORHEXIDINE GLUCONATE CLOTH 2 % EX PADS
6.0000 | MEDICATED_PAD | Freq: Every day | CUTANEOUS | Status: DC
  Administered 2024-02-10: 6 via TOPICAL

## 2024-02-10 MED ORDER — CEFAZOLIN SODIUM-DEXTROSE 2-4 GM/100ML-% IV SOLN
INTRAVENOUS | Status: AC
  Filled 2024-02-10: qty 100

## 2024-02-10 MED ORDER — CHLORHEXIDINE GLUCONATE 0.12 % MT SOLN
OROMUCOSAL | Status: AC
  Administered 2024-02-10: 15 mL via OROMUCOSAL
  Filled 2024-02-10: qty 15

## 2024-02-10 MED ORDER — CHLORHEXIDINE GLUCONATE CLOTH 2 % EX PADS: 6.0000 | MEDICATED_PAD | Freq: Once | CUTANEOUS | Status: DC

## 2024-02-10 MED ORDER — ORAL CARE MOUTH RINSE: 15.0000 mL | Freq: Once | OROMUCOSAL | Status: AC

## 2024-02-10 MED ORDER — CEFAZOLIN SODIUM-DEXTROSE 2-4 GM/100ML-% IV SOLN: 2.0000 g | INTRAVENOUS | Status: DC

## 2024-02-10 MED ORDER — CHLORHEXIDINE GLUCONATE 0.12 % MT SOLN
15.0000 mL | Freq: Once | OROMUCOSAL | Status: AC
  Filled 2024-02-10: qty 15

## 2024-02-10 MED ORDER — MUPIROCIN 2 % EX OINT
1.0000 | TOPICAL_OINTMENT | Freq: Two times a day (BID) | CUTANEOUS | Status: DC
  Administered 2024-02-10 – 2024-02-11 (×2): 1 via NASAL
  Filled 2024-02-10 (×2): qty 22

## 2024-02-10 MED ORDER — MIDAZOLAM HCL 2 MG/2ML IJ SOLN
INTRAMUSCULAR | Status: AC
  Filled 2024-02-10: qty 2

## 2024-02-10 MED ORDER — LACTATED RINGERS IV SOLN: INTRAVENOUS | Status: DC

## 2024-02-10 MED ORDER — FENTANYL CITRATE (PF) 250 MCG/5ML IJ SOLN
INTRAMUSCULAR | Status: AC
  Filled 2024-02-10: qty 5

## 2024-02-10 MED ORDER — LIDOCAINE 2% (20 MG/ML) 5 ML SYRINGE
INTRAMUSCULAR | Status: AC
  Filled 2024-02-10: qty 5

## 2024-02-10 MED ORDER — PROPOFOL 10 MG/ML IV BOLUS
INTRAVENOUS | Status: AC
  Filled 2024-02-10: qty 20

## 2024-02-10 NOTE — H&P (Signed)
 Robert [name] is an 68 y.o. male.   Chief Complaint: Back and right leg pain HPI: 68 year old gentleman admitted to the hospital last week with severe back and right leg pain try to get his pain under control with IV steroids as well as an epidural steroid injection nothing worked so we had recommended a two-level lumbar fusion at L3-4 and L5-S1 above and below his previous L4-5 fusion.  We are prepping for that however if we were made aware of the patient took a GLP-1 injection on Saturday that precludes him from getting nonemergent surgery for approximately 10 days.  We went over extensive discussions with he and his wife and just do not feel that it is in his best interest to push this forward urgently or emergently in the setting.  We will get him on the schedule for soon as possible moment but we have canceled the surgery and will get the patient back upstairs work on pain management see if we can get him home and bring him back.  Past Medical History:  Diagnosis Date   Allergic rhinitis due to pollen    Arthritis    Asthma    Back pain    Barrett esophagus    Depressive disorder    Diabetes mellitus type 2 in obese    Exposure to hepatitis B    Exposure to hepatitis C    Narcotic abuse (HCC)    pain medications   OSA (obstructive sleep apnea)    on CPAP     Past Surgical History:  Procedure Laterality Date   ABDOMINAL SURGERY     AMPUTATION Right 06/11/2018   Procedure: RIGHT 2ND RAY AMPUTATION;  Surgeon: Harden Jerona GAILS, MD;  Location: [facility] OR;  Service: Orthopedics;  Laterality: Right;   AMPUTATION TOE Left 06/02/2022   Procedure: LEFT FOURTH PARTIAL TOE AMPUTATION;  Surgeon: Silva Juliene SAUNDERS, DPM;  Location: [facility] ORS;  Service: Podiatry;  Laterality: Left;   AMPUTATION TOE Right 04/11/2023   Procedure: AMPUTATION TOE;  Surgeon: Malvin Marsa FALCON, DPM;  Location: [facility] OR;  Service: Orthopedics/Podiatry;  Laterality: Right;  Right 3rd toe amputation   BACK SURGERY     Rods and  screws in lumbar area   COLONOSCOPY     ESOPHAGOGASTRODUODENOSCOPY  multiple   HERNIA REPAIR     6   IR INJECT/THERA/INC NEEDLE/CATH/PLC EPI/LUMB/SAC W/IMG  02/04/2024   TOTAL KNEE ARTHROPLASTY  2009   left    Family History  Problem Relation Age of Onset   Allergies Mother    Early death Father    Thyroid  disease Sister    Colon cancer Neg Hx    Stomach cancer Neg Hx    Social History:  reports that he quit smoking about 19 years ago. His smoking use included cigarettes. He started smoking about 34 years ago. He has a 12 pack-year smoking history. He has never used smokeless tobacco. He reports that he does not drink alcohol and does not use drugs.  Allergies:  Allergies  Allergen Reactions   Onion Other (See Comments)    Stomach Irritation    Latex Rash    With prolonged contact    Medications Prior to Admission  Medication Sig Dispense Refill   amLODipine -olmesartan  (AZOR ) 5-40 MG tablet Take 1 tablet by mouth daily.     APPLE CIDER VINEGAR PO Take 15 mLs by mouth 2 (two) times daily.      B Complex Vitamins (B COMPLEX 100 PO) Take 1 capsule by  mouth daily.     CALCIUM -MAGNESIUM -ZINC PO Take 1 tablet by mouth every other day.     Cholecalciferol  (VITAMIN D3) 5000 units TABS 5,000 IU OTC vitamin D3 daily. 90 tablet 3   FARXIGA  10 MG TABS tablet Take 10 mg by mouth daily.     furosemide (LASIX) 40 MG tablet TAKE 1/2-1 TABLET BY MOUTH 1 TIME A DAY AS NEEDED FLUID. FOR OCCASIONAL USE.     GINSENG PO Take 1 tablet by mouth 2 (two) times a week.     meloxicam  (MOBIC ) 15 MG tablet TAKE 1 TABLET BY MOUTH EVERY DAY FOR 30 DAYS     Potassium 99 MG TABS Take 2 tablets by mouth in the morning and at bedtime.     rosuvastatin  (CRESTOR ) 5 MG tablet Take 1 tablet (5 mg total) by mouth daily. 30 tablet 3   tirzepatide (MOUNJARO) 15 MG/0.5ML Pen Inject 15 mg into the skin once a week.     traMADol  (ULTRAM ) 50 MG tablet Take 100 mg by mouth 4 (four) times daily.     vitamin C (ASCORBIC  ACID) 500 MG tablet Take 500 mg by mouth daily.     VITAMIN E PO Take 1 tablet by mouth daily.     blood glucose meter kit and supplies Dispense based on patient and insurance preference. Use to check glucose fasting in the AM and then after largest meal of the day.. (FOR ICD-10 E10.9, E11.9). 1 each 0   Blood Glucose Monitoring Suppl (ONE TOUCH ULTRA MINI) w/Device KIT Use to test blood sugar, test in the morning fasting and test after largest meal of the day. 1 each 0   cyclobenzaprine  (FLEXERIL ) 10 MG tablet TAKE 1/2 TO 1 TABLET BY MOUTH EVERY 8 HOURS AS NEEDED FOR MUSCLE SPASM. MAY CAUSE SEDATION.     glucose blood (ONE TOUCH ULTRA TEST) test strip Use to test blood sugar, test in the morning fasting and test after largest meal of the day. 100 each 11   NARCAN  4 MG/0.1ML LIQD nasal spray kit Place 1 spray into the nose daily as needed (overdose). (Patient not taking: Reported on 02/02/2024)     ONETOUCH DELICA LANCETS 33G MISC Use for testing blood sugar, test once in the morning fasting and test after largest meal of the day. 100 each 11   valACYclovir  (VALTREX ) 1000 MG tablet TAKE 1 TABLET BY MOUTH DAILY AS NEEDED (FOR OUTBREAKS ONLY). (Patient not taking: Reported on 02/02/2024) 5 tablet 0    Results for orders placed or performed during the hospital encounter of 02/02/24 (from the past 48 hours)  Glucose, capillary     Status: Abnormal   Collection Time: 02/08/24  9:07 PM  Result Value Ref Range   Glucose-Capillary 162 (H) 70 - 99 mg/dL    Comment: Glucose reference range applies only to samples taken after fasting for at least 8 hours.  Glucose, capillary     Status: Abnormal   Collection Time: 02/09/24  8:29 AM  Result Value Ref Range   Glucose-Capillary 188 (H) 70 - 99 mg/dL    Comment: Glucose reference range applies only to samples taken after fasting for at least 8 hours.  Glucose, capillary     Status: Abnormal   Collection Time: 02/09/24 12:23 PM  Result Value Ref Range    Glucose-Capillary 157 (H) 70 - 99 mg/dL    Comment: Glucose reference range applies only to samples taken after fasting for at least 8 hours.  Glucose, capillary  Status: Abnormal   Collection Time: 02/09/24  3:26 PM  Result Value Ref Range   Glucose-Capillary 206 (H) 70 - 99 mg/dL    Comment: Glucose reference range applies only to samples taken after fasting for at least 8 hours.  Glucose, capillary     Status: Abnormal   Collection Time: 02/09/24  9:09 PM  Result Value Ref Range   Glucose-Capillary 162 (H) 70 - 99 mg/dL    Comment: Glucose reference range applies only to samples taken after fasting for at least 8 hours.  Glucose, capillary     Status: Abnormal   Collection Time: 02/10/24  1:08 AM  Result Value Ref Range   Glucose-Capillary 116 (H) 70 - 99 mg/dL    Comment: Glucose reference range applies only to samples taken after fasting for at least 8 hours.  Glucose, capillary     Status: Abnormal   Collection Time: 02/10/24  7:23 AM  Result Value Ref Range   Glucose-Capillary 145 (H) 70 - 99 mg/dL    Comment: Glucose reference range applies only to samples taken after fasting for at least 8 hours.  CBC     Status: Abnormal   Collection Time: 02/10/24  9:00 AM  Result Value Ref Range   WBC 10.9 (H) 4.0 - 10.5 K/uL   RBC 5.35 4.22 - 5.81 MIL/uL   Hemoglobin 16.5 13.0 - 17.0 g/dL   HCT 54.1 60.9 - 47.9 %   MCV 85.6 80.0 - 100.0 fL   MCH 30.8 26.0 - 34.0 pg   MCHC 36.0 30.0 - 36.0 g/dL   RDW 86.8 88.4 - 84.4 %   Platelets 159 150 - 400 K/uL   nRBC 0.0 0.0 - 0.2 %    Comment: Performed at [facility], [address], [place], [place] [address]  Basic metabolic panel     Status: Abnormal   Collection Time: 02/10/24  9:00 AM  Result Value Ref Range   Sodium 132 (L) 135 - 145 mmol/L   Potassium 4.3 3.5 - 5.1 mmol/L   Chloride 102 98 - 111 mmol/L   CO2 23 22 - 32 mmol/L   Glucose, Bld 127 (H) 70 - 99 mg/dL    Comment: Glucose reference range applies only to  samples taken after fasting for at least 8 hours.   BUN 29 (H) 8 - 23 mg/dL   Creatinine, Ser 9.22 0.61 - 1.24 mg/dL   Calcium  8.3 (L) 8.9 - 10.3 mg/dL   GFR, Estimated >39 >39 mL/min    Comment: (NOTE) Calculated using the CKD-EPI Creatinine Equation (2021)    Anion gap 7 5 - 15    Comment: Performed at [facility], [address], [place], [place] [address]  Glucose, capillary     Status: Abnormal   Collection Time: 02/10/24 11:47 AM  Result Value Ref Range   Glucose-Capillary 116 (H) 70 - 99 mg/dL    Comment: Glucose reference range applies only to samples taken after fasting for at least 8 hours.  Glucose, capillary     Status: Abnormal   Collection Time: 02/10/24  3:57 PM  Result Value Ref Range   Glucose-Capillary 113 (H) 70 - 99 mg/dL    Comment: Glucose reference range applies only to samples taken after fasting for at least 8 hours.  Type and screen [facility]     Status: None   Collection Time: 02/10/24  4:55 PM  Result Value Ref Range   ABO/RH(D) MALVA  POS    Antibody Screen NEG    Sample Expiration      02/13/2024,2359 Performed at [facility], [address], [place], [place] [address]    No results found.  Review of Systems  Musculoskeletal:  Positive for back pain.  Neurological:  Positive for weakness.    Blood pressure (!) 120/56, pulse (!) 56, temperature 97.8 F (36.6 C), temperature source Oral, resp. rate 16, height 5' 11 (1.803 m), weight (!) 142 kg, SpO2 95%. Physical Exam HENT:     Head: Normocephalic.     Right Ear: Tympanic membrane normal.     Nose: Nose normal.     Mouth/Throat:     Mouth: Mucous membranes are moist.  Cardiovascular:     Rate and Rhythm: Normal rate.     Pulses: Normal pulses.  Pulmonary:     Effort: Pulmonary effort is normal.  Musculoskeletal:        General: Normal range of motion.     Cervical back: Normal range of motion.  Skin:    General: Skin is warm.  Neurological:     Mental  Status: He is alert.     Comments: Strength 5 out of 5 iliopsoas, quads, hamstrings, gastroc, tibialis, EHL.      Assessment/Plan 68 year old with severe stenosis L3-4 herniated disc L5-S1 working on pain management had to cancel surgery for anesthesia risk reasons and will reschedule  Arley SHAUNNA Helling, MD 02/10/2024, 6:01 PM

## 2024-02-10 NOTE — Progress Notes (Signed)
 Occupational Therapy Treatment Patient Details Name: Robert [name] MRN: [id number] DOB: 13-Feb-1956 Today's Date: 02/10/2024   History of present illness Robert [name] is a 68 y.o. male admitted 02/02/24 due to un retracting back pain: right-sided radicular symptoms x 5 days (trouble walking). MRI revealed moderately severe spinal stenosis at L3-4 above his 4 prior fusion significant right-sided L5-S1 compression at the L5-S1 disc space. NSG non-op management. PMH includes asthma, allergic rhinitis, chronic back pain, hypertension, hyperlipidemia, Barrett's esophagus, depression, type 2 diabetes, diabetic foot infections/osteomyelitis/toe amputations, OSA on CPAP   OT comments  Focus of session on teaching pt back precautions related to ADLs, compensatory strategies for bathing, dressing and pericare and IADLs he will need to avoid to adhere to precautions. Provided post op back instruction written handout to reinforce. Pt receptive to education. Eager for surgery later today.       If plan is discharge home, recommend the following:  A little help with walking and/or transfers;A lot of help with bathing/dressing/bathroom;Assistance with cooking/housework;Assist for transportation;Help with stairs or ramp for entrance   Equipment Recommendations  BSC/3in1;Tub/shower bench    Recommendations for Other Services      Precautions / Restrictions Precautions Precautions: Back;Fall Precaution Booklet Issued: Yes (comment) Precaution/Restrictions Comments: provided post op back handout and reviewed       Mobility Bed Mobility               General bed mobility comments: cues to avoid twisting in bed    Transfers                         Balance                                           ADL either performed or assessed with clinical judgement   ADL                                         General ADL Comments: Educated pt in two cup  method for oral care, use of washcloth on face to avoid bending over sink. Recommended tongs and wet wipes, long handled bath sponge to wash back. Instructed pt in IADLs he will need to avoid. Pt has 2 adult children who can assist in addition to his wife.    Extremity/Trunk Assessment              Vision       Restaurant manager, fast food Communication: No apparent difficulties   Cognition Arousal: Alert Behavior During Therapy: WFL for tasks assessed/performed Cognition: No apparent impairments                                        Cueing      Exercises      Shoulder Instructions       General Comments      Pertinent Vitals/ Pain       Pain Assessment Pain Assessment: Faces Faces Pain Scale: Hurts even more Pain Location: low back radiating down R leg Pain Descriptors / Indicators: Aching, Radiating Pain Intervention(s): RN gave pain meds during session  Home Living                                          Prior Functioning/Environment              Frequency  Min 2X/week        Progress Toward Goals  OT Goals(current goals can now be found in the care plan section)  Progress towards OT goals: Progressing toward goals  Acute Rehab OT Goals OT Goal Formulation: With patient Time For Goal Achievement: 02/18/24 Potential to Achieve Goals: Good  Plan      Co-evaluation                 AM-PAC OT 6 Clicks Daily Activity     Outcome Measure   Help from another person eating meals?: None Help from another person taking care of personal grooming?: A Little Help from another person toileting, which includes using toliet, bedpan, or urinal?: A Lot Help from another person bathing (including washing, rinsing, drying)?: A Lot Help from another person to put on and taking off regular upper body clothing?: A Little Help from another person to put on and taking off regular lower  body clothing?: A Lot 6 Click Score: 16    End of Session    OT Visit Diagnosis: Unsteadiness on feet (R26.81);Other abnormalities of gait and mobility (R26.89);Muscle weakness (generalized) (M62.81);Pain   Activity Tolerance     Patient Left in bed;with call bell/phone within reach;with nursing/sitter in room   Nurse Communication Patient requests pain meds        Time: 0945-1000 OT Time Calculation (min): 15 min  Charges: OT General Charges $OT Visit: 1 Visit OT Treatments $Self Care/Home Management : 8-22 mins  Mliss HERO, OTR/L Acute Rehabilitation Services Office: [phone]   Kennth Mliss Helling 02/10/2024, 10:15 AM

## 2024-02-10 NOTE — Anesthesia Preprocedure Evaluation (Addendum)
 Anesthesia Evaluation  Patient identified by MRN, date of birth, ID band Patient awake    Reviewed: Allergy & Precautions, NPO status , Patient's Chart, lab work & pertinent test results  History of Anesthesia Complications Negative for: history of anesthetic complications  Airway Mallampati: II  TM Distance: >3 FB Neck ROM: Full    Dental  (+) Dental Advisory Given   Pulmonary sleep apnea and Continuous Positive Airway Pressure Ventilation , former smoker   breath sounds clear to auscultation       Cardiovascular hypertension, Pt. on medications (-) angina + Peripheral Vascular Disease   Rhythm:Regular Rate:Normal     Neuro/Psych    Depression    Back pain    GI/Hepatic negative GI ROS,,,(+)     substance abuse    Endo/Other  diabetes (glu 116), Oral Hypoglycemic Agents  Mounjaro: last dose 2d ago 02/08/2024 BMI 43.6  Renal/GU negative Renal ROS     Musculoskeletal  (+) Arthritis ,  narcotic dependent  Abdominal   Peds  Hematology Hb 16.5, plt 159k   Anesthesia Other Findings   Reproductive/Obstetrics                              Anesthesia Physical Anesthesia Plan  ASA: 3  Anesthesia Plan: General   Post-op Pain Management: Tylenol  PO (pre-op)*   Induction:   PONV Risk Score and Plan: 2 and Ondansetron  and Dexamethasone   Airway Management Planned:   Additional Equipment: None  Intra-op Plan:   Post-operative Plan:   Informed Consent:   Plan Discussed with: Surgeon  Anesthesia Plan Comments: (Pt took Mounjaro injection 2d ago, Dr. Onetha and pt agree to follow GLP-1 guidelines and postpone surgery until next week)         Anesthesia Quick Evaluation

## 2024-02-10 NOTE — Plan of Care (Signed)
  Problem: Education: Goal: Knowledge of General Education information will improve Description: Including pain rating scale, medication(s)/side effects and non-pharmacologic comfort measures Outcome: Progressing   Problem: Health Behavior/Discharge Planning: Goal: Ability to manage health-related needs will improve Outcome: Progressing   Problem: Clinical Measurements: Goal: Ability to maintain clinical measurements within normal limits will improve Outcome: Progressing Goal: Will remain free from infection Outcome: Progressing Goal: Diagnostic test results will improve Outcome: Progressing Goal: Respiratory complications will improve Outcome: Progressing Goal: Cardiovascular complication will be avoided Outcome: Progressing   Problem: Activity: Goal: Risk for activity intolerance will decrease Outcome: Progressing   Problem: Nutrition: Goal: Adequate nutrition will be maintained Outcome: Progressing   Problem: Coping: Goal: Level of anxiety will decrease Outcome: Progressing   Problem: Elimination: Goal: Will not experience complications related to urinary retention Outcome: Progressing   Problem: Pain Managment: Goal: General experience of comfort will improve and/or be controlled Outcome: Progressing   Problem: Safety: Goal: Ability to remain free from injury will improve Outcome: Progressing   Problem: Skin Integrity: Goal: Risk for impaired skin integrity will decrease Outcome: Progressing   Problem: Education: Goal: Ability to describe self-care measures that may prevent or decrease complications (Diabetes Survival Skills Education) will improve Outcome: Progressing Goal: Individualized Educational Video(s) Outcome: Progressing   Problem: Coping: Goal: Ability to adjust to condition or change in health will improve Outcome: Progressing   Problem: Fluid Volume: Goal: Ability to maintain a balanced intake and output will improve Outcome: Progressing    Problem: Health Behavior/Discharge Planning: Goal: Ability to identify and utilize available resources and services will improve Outcome: Progressing Goal: Ability to manage health-related needs will improve Outcome: Progressing   Problem: Metabolic: Goal: Ability to maintain appropriate glucose levels will improve Outcome: Progressing   Problem: Nutritional: Goal: Maintenance of adequate nutrition will improve Outcome: Progressing Goal: Progress toward achieving an optimal weight will improve Outcome: Progressing   Problem: Skin Integrity: Goal: Risk for impaired skin integrity will decrease Outcome: Progressing   Problem: Tissue Perfusion: Goal: Adequacy of tissue perfusion will improve Outcome: Progressing

## 2024-02-10 NOTE — Progress Notes (Signed)
 Subjective: Patient reports stable continues with right leg pain  Objective: Vital signs in last 24 hours: Temp:  [97.7 F (36.5 C)-98.6 F (37 C)] 97.8 F (36.6 C) (07/28 1630) Pulse Rate:  [52-61] 56 (07/28 1630) Resp:  [16-19] 16 (07/28 1630) BP: (97-125)/(49-75) 120/56 (07/28 1630) SpO2:  [95 %-99 %] 95 % (07/28 1630) Weight:  [142 kg] 142 kg (07/28 1630)  Intake/Output from previous day: 07/27 0701 - 07/28 0700 In: -  Out: 1050 [Urine:1050] Intake/Output this shift: No intake/output data recorded.  Awake and alert strength is 5 out of 5  Lab Results: Recent Labs    02/10/24 0900  WBC 10.9*  HGB 16.5  HCT 45.8  PLT 159   BMET Recent Labs    02/10/24 0900  NA 132*  K 4.3  CL 102  CO2 23  GLUCOSE 127*  BUN 29*  CREATININE 0.77  CALCIUM  8.3*    Studies/Results: No results found.  Assessment/Plan: 68 year old gentleman with severe back predominately right leg pain workup revealed severe spinal stenosis at L3-4 and L5-S1 above below his previous fusion.  Due to patient progression of clinical syndrome imaging finding failed conservative treatment I recommended decompression stabilization procedure at those 2 levels.  I extensively reviewed the risks and benefits of the operation with the patient as well as perioperative course expectations of outcome and alternatives to surgery and he understands and agrees to proceed forward.  LOS: 7 days     Arley SHAUNNA Helling 02/10/2024, 4:50 PM

## 2024-02-10 NOTE — Progress Notes (Signed)
 PROGRESS NOTE Robert [name]  FMW:[id number] DOB: May 18, 1956 DOA: 02/02/2024 PCP: [facility]  Brief Narrative/Hospital Course: 68 y.o. 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 He was recently seen by orthopedics and had an MRI done 2 days before admission.  Neurosurgery Dr Onetha on board, placed on IV steroids and S/p  Rt L5 nerve root block plus transforaminal epidural steroid injection by IR 7/22. No significant improvement despite epidural injection and planning for operative intervention By Dr. Onetha on 7/28  Subjective: Patient seen and examined Not feeling well, anxious about the surgery Overnight patient remained afebrile BP stable Blood sugar in 140s. Pain controlled on  prn iV Dilaudid  along with oral oxy, bowel regimen Labs pending this am  Assessment and plan:  Severe low back pain w/ R sided radiculopathy and neuropathy Prior L4-L5 fusion: Dr Onetha on board-Pain secondary to severe stenosis above and below his fusion at L4-5 He is on iv Decadron  and also  S/P  Rt L5 nerve root block plus transforaminal epidural steroid injection by IR 02/04/24. Patient has failed conservative treatment and Dr Onetha is planning for surgical intervention 7/28  Cont scheduled Tylenol  with Robaxin  and  PRN  Oxycodone  5-10 mg and minimize iv opiates.  Asthma: Stable,Monitor    Hypotension episodes after opiate use HTN: Hypotension following opiate use x 1>given IV fluid bolus 7/24 - BP remains well-controlled. Cont to hold Norvasc , Avapro     Hyperlipidemia: Cont Crestor  5 mg PO daily    DM type2:  Well-controlled on sliding scale insulin .  Monitor closely  while on IV Decadron    OSA  Continue home nasal CPAP qhs  Constipation:worsened in the setting of opioid use Cont Colace, will make MiraLAX  twice daily   Class III obesity with adiposopathy symptoms: On Tirzepatide at home. Complicating care.  Will benefit patient with weight loss   Severe left knee osteoarthritis: Needs left knee replacement but he needs to lose weight before he can undergo surgery He is on Tirzepatide at home.   DVT prophylaxis: SCDs Start: 02/02/24 2347 Code Status:   Code Status: Full Code Family Communication: plan of care discussed with patient at bedside Patient status is: Remains hospitalized because of severity of illness Level of care: Med-Surg    Dispo: The patient is from: home            Anticipated disposition: Pending operative intervention  Objective: Vitals last 24 hrs: Vitals:   02/09/24 2222 02/09/24 2308 02/10/24 0237 02/10/24 0724  BP: (!) 97/49 (!) 97/49 120/66 108/73  Pulse: (!) 56 (!) 56 (!) 52 61  Resp: 16 16 16 19   Temp: 98 F (36.7 C) 98 F (36.7 C) 97.7 F (36.5 C) 98.6 F (37 C)  TempSrc: Oral Oral Oral   SpO2: 98% 98% 98% 99%  Weight:      Height:        Physical Examination: General exam: alert awake, oriented, pleasant obese HEENT:Oral mucosa moist, Ear/Nose WNL grossly Respiratory system: Bilaterally clear BS,no use of accessory muscle Cardiovascular system: S1 & S2 +, No JVD. Gastrointestinal system: Abdomen soft,NT,ND, BS+ Nervous System: Alert, awake, moving all extremities,and following commands. Extremities: LE edema neg,distal peripheral pulses palpable and warm.  Skin: No rashes,no icterus. MSK: Normal muscle bulk,tone, power  Data Reviewed: I have personally reviewed following labs and imaging studies ( see epic result tab) CBC: Recent Labs  Lab 02/04/24 0653 02/07/24 0456  WBC 10.1 12.2*  HGB 15.1 16.5  HCT 42.7 46.2  MCV 87.3 86.0  PLT 173 175   CMP: Recent Labs  Lab 02/04/24 0653  02/07/24 0456  NA 133* 133*  K 4.6 4.3  CL 102 103  CO2 23 21*  GLUCOSE 141* 142*  BUN 29* 31*  CREATININE 0.85 0.83  CALCIUM  9.1 8.6*  MG 2.1  --    GFR: Estimated Creatinine Clearance: 121.3 mL/min (by C-G formula based on SCr of 0.83 mg/dL). Recent Labs  Lab 02/04/24 0653  AST 16  ALT 19  ALKPHOS 55  BILITOT 0.8  PROT 6.2*  ALBUMIN 3.4*   No results for input(s): LIPASE, AMYLASE in the last 168 hours. No results for input(s): AMMONIA in the last 168 hours. Coagulation Profile:  Recent Labs  Lab 02/04/24 1300  INR 1.2   Unresulted Labs (From admission, onward)     Start     Ordered   02/10/24 0731  CBC  ONCE - STAT,   STAT        02/10/24 0730   02/10/24 0731  Basic metabolic panel  ONCE - STAT,   STAT        02/10/24 0730           Antimicrobials/Microbiology: Anti-infectives (From admission, onward)    None         Component Value Date/Time   SDES WOUND RIGHT TOE 04/11/2023 1725   SPECREQUEST MTJ PT ON ZOSYN  04/11/2023 1725   CULT  04/11/2023 1725    RARE STAPHYLOCOCCUS AUREUS WITHIN MIXED ORGANISMS NO ANAEROBES ISOLATED Performed at [facility], [address], [place], [place] [address]    REPTSTATUS 04/17/2023 FINAL 04/11/2023 1725  Medications reviewed:  Scheduled Meds:  acetaminophen   1,000 mg Oral TID   dexamethasone  (DECADRON ) injection  4 mg Intravenous Q6H   docusate sodium   100 mg Oral BID   insulin  aspart  0-5 Units Subcutaneous QHS   insulin  aspart  0-9 Units Subcutaneous TID WC   lidocaine  (PF)  30 mL Intradermal Once   methocarbamol   750 mg Oral TID   polyethylene glycol  17 g Oral BID   rosuvastatin   5 mg Oral Daily   Continuous Infusions:  Mennie LAMY, MD [facility] 02/10/2024, 8:54 AM

## 2024-02-10 NOTE — Progress Notes (Signed)
 PT Cancellation Note  Patient Details Name: Robert [name] MRN: [id number] DOB: 1955-09-25   Cancelled Treatment:    Reason Eval/Treat Not Completed: Patient declined, no reason specified. Pt reporting that he was due for pain meds and waiting on his RN. Pt reporting that he has been ambulating in hallway with his family when they are present. Pt reporting his spouse will be arriving to the hospital soon. Surgery is scheduled for later today (removal of old hardware and extension of fusion). PT will continue to f/u with pt acutely as available and appropriate.    Robert [name] 02/10/2024, 9:44 AM

## 2024-02-10 NOTE — Progress Notes (Signed)
 Attempts x2 to reach Deland, RN for 5C-18, with no answer. The pt will be transported back to the floor without having his surgical procedure, per Dr. Onetha. Please see Dr. Eliott note for details. Awaiting transportation. Pt in no apparent distress.

## 2024-02-10 NOTE — Plan of Care (Signed)

## 2024-02-11 DIAGNOSIS — M545 Low back pain, unspecified: Secondary | ICD-10-CM | POA: Diagnosis not present

## 2024-02-11 LAB — GLUCOSE, CAPILLARY: Glucose-Capillary: 157 mg/dL — ABNORMAL HIGH (ref 70–99)

## 2024-02-11 MED ORDER — GABAPENTIN 300 MG PO CAPS: 300.0000 mg | ORAL_CAPSULE | Freq: Three times a day (TID) | ORAL | 2 refills | Status: AC

## 2024-02-11 MED ORDER — PREDNISONE 20 MG PO TABS: 20.0000 mg | ORAL_TABLET | Freq: Every day | ORAL | 0 refills | Status: AC

## 2024-02-11 MED ORDER — METHOCARBAMOL 750 MG PO TABS: 750.0000 mg | ORAL_TABLET | Freq: Three times a day (TID) | ORAL | 1 refills | Status: AC

## 2024-02-11 MED ORDER — OXYCODONE HCL 5 MG PO TABS: 15.0000 mg | ORAL_TABLET | ORAL | 0 refills | Status: DC | PRN

## 2024-02-11 NOTE — Plan of Care (Signed)
  Problem: Education: Goal: Knowledge of General Education information will improve Description: Including pain rating scale, medication(s)/side effects and non-pharmacologic comfort measures Outcome: Progressing   Problem: Health Behavior/Discharge Planning: Goal: Ability to manage health-related needs will improve Outcome: Progressing   Problem: Clinical Measurements: Goal: Ability to maintain clinical measurements within normal limits will improve Outcome: Progressing Goal: Will remain free from infection Outcome: Progressing Goal: Diagnostic test results will improve Outcome: Progressing Goal: Respiratory complications will improve Outcome: Progressing Goal: Cardiovascular complication will be avoided Outcome: Progressing   Problem: Activity: Goal: Risk for activity intolerance will decrease Outcome: Progressing   Problem: Nutrition: Goal: Adequate nutrition will be maintained Outcome: Progressing   Problem: Coping: Goal: Level of anxiety will decrease Outcome: Progressing   Problem: Elimination: Goal: Will not experience complications related to urinary retention Outcome: Progressing   Problem: Pain Managment: Goal: General experience of comfort will improve and/or be controlled Outcome: Progressing   Problem: Safety: Goal: Ability to remain free from injury will improve Outcome: Progressing   Problem: Skin Integrity: Goal: Risk for impaired skin integrity will decrease Outcome: Progressing   Problem: Education: Goal: Ability to describe self-care measures that may prevent or decrease complications (Diabetes Survival Skills Education) will improve Outcome: Progressing Goal: Individualized Educational Video(s) Outcome: Progressing   Problem: Coping: Goal: Ability to adjust to condition or change in health will improve Outcome: Progressing   Problem: Fluid Volume: Goal: Ability to maintain a balanced intake and output will improve Outcome: Progressing    Problem: Health Behavior/Discharge Planning: Goal: Ability to identify and utilize available resources and services will improve Outcome: Progressing Goal: Ability to manage health-related needs will improve Outcome: Progressing   Problem: Metabolic: Goal: Ability to maintain appropriate glucose levels will improve Outcome: Progressing   Problem: Nutritional: Goal: Maintenance of adequate nutrition will improve Outcome: Progressing Goal: Progress toward achieving an optimal weight will improve Outcome: Progressing   Problem: Skin Integrity: Goal: Risk for impaired skin integrity will decrease Outcome: Progressing   Problem: Tissue Perfusion: Goal: Adequacy of tissue perfusion will improve Outcome: Progressing

## 2024-02-11 NOTE — TOC Progression Note (Signed)
 Transition of Care [facility]) - Progression Note    Patient Details  Name: Robert [name] MRN: [id number] Date of Birth: 01-23-1956  Transition of Care [facility]) CM/SW Contact  Nola Devere Hands, RN Phone Number: 02/11/2024, 10:27 AM  Clinical Narrative:    Patient will discharge home and return for surgery on Monday, pt took medication that precludes his having surgery today.    Expected Discharge Plan: OP Rehab Barriers to Discharge: Continued Medical Work up               Expected Discharge Plan and Services   Discharge Planning Services: CM Consult Post Acute Care Choice: Durable Medical Equipment Living arrangements for the past 2 months: Single Family Home                 DME Arranged: Bedside commode DME Agency: [employer], [employer] Date DME Agency Contacted: 02/04/24 Time DME Agency Contacted: 1655 Representative spoke with at DME Agency: jermaine             Social Drivers of Health (SDOH) Interventions SDOH Screenings   Food Insecurity: No Food Insecurity (02/03/2024)  Housing: Low Risk  (02/03/2024)  Transportation Needs: No Transportation Needs (02/03/2024)  Utilities: Not At Risk (02/03/2024)  Depression (PHQ2-9): Low Risk  (04/03/2021)  Social Connections: Unknown (02/03/2024)  Tobacco Use: Medium Risk (02/10/2024)    Readmission Risk Interventions     No data to display

## 2024-02-11 NOTE — Discharge Summary (Signed)
 Physician Discharge Summary  Robert [name] FMW:[id number] DOB: 12-21-1955 DOA: 02/02/2024  PCP: [facility]  Admit date: 02/02/2024 Discharge date: 02/11/2024 Recommendations for Outpatient Follow-up:  Follow up with PCP in 1 weeks-call for appointment Please obtain BMP/CBC in one week. Follow-up with Dr. Onetha in 1 wk  Discharge Dispo: home Discharge Condition: Stable Code Status:   Code Status: Full Code Diet recommendation:  Diet Order             Diet heart healthy/carb modified Room service appropriate? Yes; Fluid consistency: Thin  Diet effective now                    Brief/Interim Summary: 68 y.o. 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 He was recently seen by orthopedics and had an MRI done 2 days before admission.  Neurosurgery Dr Onetha on board, placed on IV steroids and S/p  Rt L5 nerve root block plus transforaminal epidural steroid injection by IR 7/22. No significant improvement despite epidural injection and planning for operative intervention By Dr. Onetha on 7/28> unfortunately surgery could not be completed patient had taken injection for diabetes that precludes him from anesthesia for 10 days planning for outpatient follow-up and rescheduling surgery coming Monday  Subjective: Seen examined Overnight afebrile BP stable Blood sugar 160s  Discharge diagnoses:  Severe low back pain w/ R sided radiculopathy and neuropathy Prior L4-L5 fusion Herniated nucleus pulposus L5-S1 with severe spinal stenosis L3-4, L5-S1: Dr Onetha on board-Pain secondary to severe stenosis above and below his fusion at L4-5 He is on iv Decadron  and  also  S/P  Rt L5 nerve root block plus transforaminal epidural steroid injection by IR 02/04/24. Patient has failed conservative treatment and Dr Onetha is planning for surgical intervention 7/28  unfortunately surgery could not be completed patient had taken injection for diabetes that precludes him from anesthesia for 10 days planning for outpatient follow-up and rescheduling surgery coming Monday Continue on pain regimen and steroid  Asthma: Stable,Monitor    Hypotension episodes after opiate use HTN: Hypotension following opiate use x 1>given IV fluid bolus 7/24 - BP remains well-controlled. Cont to hold Norvasc , Avapro  and follow-up outpatient   Hyperlipidemia: Cont Crestor  5 mg PO daily    DM type2:  Well-controlled on sliding scale insulin .     OSA  Continue home nasal CPAP qhs  Constipation: worsened in the setting of opioid use Cont Colace,  MiraLAX  twice daily   Class III obesity with adiposopathy symptoms: On Tirzepatide at home. Complicating care.  Will benefit patient with weight loss   Severe left knee osteoarthritis: Needs left knee replacement but he needs to lose weight before he can undergo surgery He is on Tirzepatide at home.   DVT prophylaxis: SCDs Start: 02/02/24 2347 Code Status:   Code Status: Full Code Family Communication: plan of care discussed with patient at bedside Patient status is: Remains hospitalized because of severity of illness Level of care: Med-Surg    Dispo: The patient is from: home            Anticipated disposition: home  Objective: Vitals last 24 hrs: Vitals:   02/10/24 1156 02/10/24 1630 02/10/24 1907 02/11/24 0425  BP: 125/75 (!) 120/56 (!) 97/57 119/67  Pulse: ROLLEN)  55 (!) 56 (!) 53 (!) 55  Resp: 18 16 16 16   Temp: 97.9 F (36.6 C) 97.8 F (36.6 C) 98.7 F (37.1 C) 97.8 F (36.6 C)  TempSrc:  Oral Oral Oral  SpO2: 97% 95% 95% 97%  Weight:  (!) 142 kg    Height:  5' 11 (1.803 m)      Physical Examination: General  exam: alert awake, oriented x3 HEENT:Oral mucosa moist, Ear/Nose WNL grossly Respiratory system: Bilaterally clear BS,no use of accessory muscle Cardiovascular system: S1 & S2 +, No JVD. Gastrointestinal system: Abdomen soft,NT,ND, BS+ Nervous System: Alert, awake, moving all extremities, and nonfocal  Extremities: LE edema neg,distal peripheral pulses palpable and warm.  Skin: No rashes,no icterus. MSK: Normal muscle bulk,tone, power    Discharge Exam: Vitals:   02/11/24 0425 02/11/24 0820  BP: 119/67 124/64  Pulse: (!) 55 (!) 57  Resp: 16 17  Temp: 97.8 F (36.6 C) 98 F (36.7 C)  SpO2: 97% 93%   General: Pt is alert, awake, not in acute distress Cardiovascular: RRR, S1/S2 +, no rubs, no gallops Respiratory: CTA bilaterally, no wheezing, no rhonchi Abdominal: Soft, NT, ND, bowel sounds + Extremities: no edema, no cyanosis Discharge Instructions  Discharge Instructions     Ambulatory referral to Occupational Therapy   Complete by: As directed    Ambulatory referral to Physical Therapy   Complete by: As directed    Discharge instructions   Complete by: As directed    Please call call MD or return to ER for similar or worsening recurring problem that brought you to hospital or if any fever,nausea/vomiting,abdominal pain, uncontrolled pain, chest pain,  shortness of breath or any other alarming symptoms.  Please follow-up your doctor as instructed in a week time and call the office for appointment.  Please avoid alcohol, smoking, or any other illicit substance and maintain healthy habits including taking your regular medications as prescribed.  You were cared for by a hospitalist during your hospital stay. If you have any questions about your discharge medications or the care you received while you were in the hospital after you are discharged, you can call the unit and ask to speak with the hospitalist on call if the hospitalist that took care of you is not available.  Once  you are discharged, your primary care physician will handle any further medical issues. Please note that NO REFILLS for any discharge medications will be authorized once you are discharged, as it is imperative that you return to your primary care physician (or establish a relationship with a primary care physician if you do not have one) for your aftercare needs so that they can reassess your need for medications and monitor your lab values   Increase activity slowly   Complete by: As directed    No wound care   Complete by: As directed       Allergies as of 02/11/2024       Reactions   Onion Other (See Comments)   Stomach Irritation    Latex Rash   With prolonged contact        Medication List     PAUSE taking these medications    amLODipine -olmesartan  5-40 MG tablet Wait to take this until your doctor or other care provider tells you to start again. Commonly known as: AZOR  Take 1 tablet by mouth daily.       TAKE these medications    APPLE CIDER VINEGAR PO Take 15 mLs by mouth 2 (  two) times daily.   ascorbic acid 500 MG tablet Commonly known as: VITAMIN C Take 500 mg by mouth daily.   B COMPLEX 100 PO Take 1 capsule by mouth daily.   blood glucose meter kit and supplies Dispense based on patient and insurance preference. Use to check glucose fasting in the AM and then after largest meal of the day.. (FOR ICD-10 E10.9, E11.9).   CALCIUM -MAGNESIUM -ZINC PO Take 1 tablet by mouth every other day.   cyclobenzaprine  10 MG tablet Commonly known as: FLEXERIL  TAKE 1/2 TO 1 TABLET BY MOUTH EVERY 8 HOURS AS NEEDED FOR MUSCLE SPASM. MAY CAUSE SEDATION.   Farxiga  10 MG Tabs tablet Generic drug: dapagliflozin  propanediol Take 10 mg by mouth daily.   furosemide 40 MG tablet Commonly known as: LASIX TAKE 1/2-1 TABLET BY MOUTH 1 TIME A DAY AS NEEDED FLUID. FOR OCCASIONAL USE.   gabapentin  300 MG capsule Commonly known as: Neurontin  Take 1 capsule (300 mg total) by  mouth 3 (three) times daily.   GINSENG PO Take 1 tablet by mouth 2 (two) times a week.   glucose blood test strip Commonly known as: ONE TOUCH ULTRA TEST Use to test blood sugar, test in the morning fasting and test after largest meal of the day.   meloxicam  15 MG tablet Commonly known as: MOBIC  TAKE 1 TABLET BY MOUTH EVERY DAY FOR 30 DAYS   methocarbamol  750 MG tablet Commonly known as: ROBAXIN  Take 1 tablet (750 mg total) by mouth 3 (three) times daily.   Mounjaro 15 MG/0.5ML Pen Generic drug: tirzepatide Inject 15 mg into the skin once a week.   Narcan  4 MG/0.1ML Liqd nasal spray kit Generic drug: naloxone  Place 1 spray into the nose daily as needed (overdose).   ONE TOUCH ULTRA MINI w/Device Kit Use to test blood sugar, test in the morning fasting and test after largest meal of the day.   OneTouch Delica Lancets 33G Misc Use for testing blood sugar, test once in the morning fasting and test after largest meal of the day.   oxyCODONE  5 MG immediate release tablet Commonly known as: Oxy IR/ROXICODONE  Take 3 tablets (15 mg total) by mouth every 4 (four) hours as needed for severe pain (pain score 7-10) or moderate pain (pain score 4-6) (5mg  for moderate pain and 10 mg for severe).   Potassium 99 MG Tabs Take 2 tablets by mouth in the morning and at bedtime.   predniSONE  20 MG tablet Commonly known as: DELTASONE  Take 1 tablet (20 mg total) by mouth daily for 3 days.   rosuvastatin  5 MG tablet Commonly known as: CRESTOR  Take 1 tablet (5 mg total) by mouth daily.   traMADol  50 MG tablet Commonly known as: ULTRAM  Take 100 mg by mouth 4 (four) times daily.   valACYclovir  1000 MG tablet Commonly known as: VALTREX  TAKE 1 TABLET BY MOUTH DAILY AS NEEDED (FOR OUTBREAKS ONLY).   Vitamin D3 125 MCG (5000 UT) Tabs 5,000 IU OTC vitamin D3 daily.   VITAMIN E PO Take 1 tablet by mouth daily.               Durable Medical Equipment  (From admission, onward)            Start     Ordered   02/04/24 1649  For home use only DME Bedside commode  Once       Question:  Patient needs a bedside commode to treat with the following condition  Answer:  Weakness  02/04/24 1648            Follow-up Information     [facility] Follow up.   Specialty: Rehabilitation Why: Call to schedule apt for out patient physical and occupational therapy Contact information: [address] [place]  [address] [phone]        Onetha Kuba, MD Follow up in 1 week(s).   Specialty: Neurosurgery Contact information: [address] [place] [address] [phone]                Allergies  Allergen Reactions   Onion Other (See Comments)    Stomach Irritation    Latex Rash    With prolonged contact    The results of significant diagnostics from this hospitalization (including imaging, microbiology, ancillary and laboratory) are listed below for reference.    Microbiology: Recent Results (from the past 240 hours)  Surgical pcr screen     Status: Abnormal   Collection Time: 02/10/24  4:02 PM   Specimen: Nasal Mucosa; Nasal Swab  Result Value Ref Range Status   MRSA, PCR NEGATIVE NEGATIVE Final   Staphylococcus aureus POSITIVE (A) NEGATIVE Final    Comment: (NOTE) The Xpert SA Assay (FDA approved for NASAL specimens in patients 36 years of age and older), is one component of a comprehensive surveillance program. It is not intended to diagnose infection nor to guide or monitor treatment. Performed at [facility], [address], [place], [place] [address]     Procedures/Studies: [place] ANG DIAG/THERA/INC NEEDLE/CATH/PLC EPI/LUMB/SAC W/IMG Result Date: 02/04/2024 CLINICAL DATA:  Previous lumbar fusion none L4-5. Severe right lower extremity radicular pain. EXAM: SELECTIVE NERVE ROOT BLOCK AND TRANSFORAMINAL EPIDURAL STEROID INJECTION UNDER FLUOROSCOPY FLUOROSCOPY:  Radiation Exposure Index (as provided by the fluoroscopic device): 194.1 mGy air Kerma TECHNIQUE: An appropriate skin entry site was determined under fluoroscopy. Operator donned sterile gloves and mask. Site was marked, prepped with Betadine, draped in usual sterile fashion, infiltrated locally with 1% lidocaine . A 22 gauge 15 cm Chiba needle was advanced to the superior ventral margin of the right L5 - S1 neural foramen. Diagnostic injection of 2 ml Omnipaque  180 showed partial outlining of the exiting nerve root as well as epidural extension of contrast, with no intravascular or subarachnoid component. 80 mg Depo-Medrol  in 3 ml lidocaine  1% was administered. The patient tolerated procedure well, with no immediate complication. IMPRESSION: 1. Technically successful right L5 selective nerve root block and transforaminal epidural steroid injection Electronically Signed   By: JONETTA Faes M.D.   On: 02/04/2024 16:29    Labs: BNP (last 3 results) No results for input(s): BNP in the last 8760 hours. Basic Metabolic Panel: Recent Labs  Lab 02/07/24 0456 02/10/24 0900  NA 133* 132*  K 4.3 4.3  CL 103 102  CO2 21* 23  GLUCOSE 142* 127*  BUN 31* 29*  CREATININE 0.83 0.77  CALCIUM  8.6* 8.3*   Liver Function Tests: No results for input(s): AST, ALT, ALKPHOS, BILITOT, PROT, ALBUMIN in the last 168 hours. No results for input(s): LIPASE, AMYLASE in the last 168 hours. No results for input(s): AMMONIA in the last 168 hours. CBC: Recent Labs  Lab 02/07/24 0456 02/10/24 0900  WBC 12.2* 10.9*  HGB 16.5 16.5  HCT 46.2 45.8  MCV 86.0 85.6  PLT 175 159  CBG: Recent Labs  Lab 02/10/24 0723 02/10/24 1147 02/10/24 1557 02/10/24 2108 02/11/24 0833  GLUCAP 145* 116* 113* 161* 157*  Urinalysis    Component Value Date/Time   COLORURINE YELLOW 03/15/2018 0235   APPEARANCEUR CLEAR 03/15/2018 0235   LABSPEC 1.021 03/15/2018 0235   PHURINE 5.0 03/15/2018 0235   GLUCOSEU  NEGATIVE 03/15/2018 0235   HGBUR NEGATIVE 03/15/2018 0235   BILIRUBINUR NEGATIVE 03/15/2018 0235   KETONESUR NEGATIVE 03/15/2018 0235   PROTEINUR NEGATIVE 03/15/2018 0235   UROBILINOGEN 0.2 05/31/2012 1658   NITRITE NEGATIVE 03/15/2018 0235   LEUKOCYTESUR NEGATIVE 03/15/2018 0235   Sepsis Labs Recent Labs  Lab 02/07/24 0456 02/10/24 0900  WBC 12.2* 10.9*   Microbiology Recent Results (from the past 240 hours)  Surgical pcr screen     Status: Abnormal   Collection Time: 02/10/24  4:02 PM   Specimen: Nasal Mucosa; Nasal Swab  Result Value Ref Range Status   MRSA, PCR NEGATIVE NEGATIVE Final   Staphylococcus aureus POSITIVE (A) NEGATIVE Final    Comment: (NOTE) The Xpert SA Assay (FDA approved for NASAL specimens in patients 86 years of age and older), is one component of a comprehensive surveillance program. It is not intended to diagnose infection nor to guide or monitor treatment. Performed at [facility], [address] [place], [place] [address]    Time coordinating discharge:  25  minutes  SIGNED: Mennie LAMY, MD  [facility] 02/11/2024, 10:35 AM  If 7PM-7AM, please contact night-coverage [email or web address]

## 2024-02-11 NOTE — Telephone Encounter (Signed)
 I called the patient and reschedule his appointment.

## 2024-02-11 NOTE — Progress Notes (Signed)
 OT Cancellation Note  Patient Details Name: Robert [name] MRN: [id number] DOB: January 30, 1956   Cancelled Treatment:    Reason Eval/Treat Not Completed: (P) Patient declined, no reason specified,Pt to DC home today, surgery rescheduled, will follow up acutely as able  [name] 02/11/2024, 11:29 AM

## 2024-02-11 NOTE — Progress Notes (Signed)
 PT Cancellation Note  Patient Details Name: Robert [name] MRN: [id number] DOB: 1955-09-03   Cancelled Treatment:    Reason Eval/Treat Not Completed: Patient declined, no reason specified. Pt reporting that he will d/c home today with family support and await rescheduled spinal sx. PT will continue to f/u with pt acutely as available and appropriate.    Delon HERO Leilana Mcquire 02/11/2024, 10:42 AM

## 2024-02-11 NOTE — Discharge Summary (Signed)
 Physician Discharge Summary  Patient ID: Robert [name] MRN: [id number] DOB/AGE: 1956-02-02 68 y.o. Estimated body mass index is 43.65 kg/m as calculated from the following:   Height as of this encounter: 5' 11 (1.803 m).   Weight as of this encounter: 142 kg.   Admit date: 02/02/2024 Discharge date: 02/11/2024  Admission Diagnoses: Herniated nucleus pulposus L5-S1 severe spinal stenosis L3-4 L5-S1  Discharge Diagnoses: Same Principal Problem:   Low back pain Active Problems:   OSA (obstructive sleep apnea)   Essential hypertension   Hyperlipidemia   Type 2 diabetes mellitus (HCC)   Discharged Condition: good  Hospital Course: Patient was admitted to hospital through the emergency room with severe right hip and leg pain consistent with L5-S1 workup revealed large disc radiation L5-S1 and severe spinal stenosis critical stenosis L3-4 and degenerative disc disease L3-4 and L5-S1 previous lumbar fusion L4-5 we attempted to stabilize patient medicine with IV steroid patient underwent epidural steroid injection noting this gave him adequate relief we were prepping for surgery however 2 days prior to surgery he took an injection for his diabetes that precludes him from anesthesia for 10 days so we had to cancel surgery patient is requested to go home and come back to which were looking into and planning discharge with follow-up and rescheduling surgery for Monday.  We will coordinate to make sure there is not an insurance issue with that and hopefully patient can get home today.  Consults: Significant Diagnostic Studies: Treatments: Discharge Exam: Blood pressure 119/67, pulse (!) 55, temperature 97.8 F (36.6 C), temperature source Oral, resp. rate 16, height 5' 11 (1.803 m), weight (!) 142 kg, SpO2 97%. Strength 5 out of 5  Disposition: Home  Discharge Instructions     Ambulatory referral to Occupational Therapy   Complete by: As directed    Ambulatory referral to Physical  Therapy   Complete by: As directed       Allergies as of 02/11/2024       Reactions   Onion Other (See Comments)   Stomach Irritation    Latex Rash   With prolonged contact        Medication List     TAKE these medications    amLODipine -olmesartan  5-40 MG tablet Commonly known as: AZOR  Take 1 tablet by mouth daily.   APPLE CIDER VINEGAR PO Take 15 mLs by mouth 2 (two) times daily.   ascorbic acid 500 MG tablet Commonly known as: VITAMIN C Take 500 mg by mouth daily.   B COMPLEX 100 PO Take 1 capsule by mouth daily.   blood glucose meter kit and supplies Dispense based on patient and insurance preference. Use to check glucose fasting in the AM and then after largest meal of the day.. (FOR ICD-10 E10.9, E11.9).   CALCIUM -MAGNESIUM -ZINC PO Take 1 tablet by mouth every other day.   cyclobenzaprine  10 MG tablet Commonly known as: FLEXERIL  TAKE 1/2 TO 1 TABLET BY MOUTH EVERY 8 HOURS AS NEEDED FOR MUSCLE SPASM. MAY CAUSE SEDATION.   Farxiga  10 MG Tabs tablet Generic drug: dapagliflozin  propanediol Take 10 mg by mouth daily.   furosemide 40 MG tablet Commonly known as: LASIX TAKE 1/2-1 TABLET BY MOUTH 1 TIME A DAY AS NEEDED FLUID. FOR OCCASIONAL USE.   gabapentin  300 MG capsule Commonly known as: Neurontin  Take 1 capsule (300 mg total) by mouth 3 (three) times daily.   GINSENG PO Take 1 tablet by mouth 2 (two) times a week.   glucose blood test strip  Commonly known as: ONE TOUCH ULTRA TEST Use to test blood sugar, test in the morning fasting and test after largest meal of the day.   meloxicam  15 MG tablet Commonly known as: MOBIC  TAKE 1 TABLET BY MOUTH EVERY DAY FOR 30 DAYS   methocarbamol  750 MG tablet Commonly known as: ROBAXIN  Take 1 tablet (750 mg total) by mouth 3 (three) times daily.   Mounjaro 15 MG/0.5ML Pen Generic drug: tirzepatide Inject 15 mg into the skin once a week.   Narcan  4 MG/0.1ML Liqd nasal spray kit Generic drug:  naloxone  Place 1 spray into the nose daily as needed (overdose).   ONE TOUCH ULTRA MINI w/Device Kit Use to test blood sugar, test in the morning fasting and test after largest meal of the day.   OneTouch Delica Lancets 33G Misc Use for testing blood sugar, test once in the morning fasting and test after largest meal of the day.   oxyCODONE  5 MG immediate release tablet Commonly known as: Oxy IR/ROXICODONE  Take 3 tablets (15 mg total) by mouth every 4 (four) hours as needed for severe pain (pain score 7-10) or moderate pain (pain score 4-6) (5mg  for moderate pain and 10 mg for severe).   Potassium 99 MG Tabs Take 2 tablets by mouth in the morning and at bedtime.   rosuvastatin  5 MG tablet Commonly known as: CRESTOR  Take 1 tablet (5 mg total) by mouth daily.   traMADol  50 MG tablet Commonly known as: ULTRAM  Take 100 mg by mouth 4 (four) times daily.   valACYclovir  1000 MG tablet Commonly known as: VALTREX  TAKE 1 TABLET BY MOUTH DAILY AS NEEDED (FOR OUTBREAKS ONLY).   Vitamin D3 125 MCG (5000 UT) Tabs 5,000 IU OTC vitamin D3 daily.   VITAMIN E PO Take 1 tablet by mouth daily.               Durable Medical Equipment  (From admission, onward)           Start     Ordered   02/04/24 1649  For home use only DME Bedside commode  Once       Question:  Patient needs a bedside commode to treat with the following condition  Answer:  Weakness   02/04/24 1648            Follow-up Information     [facility] Follow up.   Specialty: Rehabilitation Why: Call to schedule apt for out patient physical and occupational therapy Contact information: [address] [place]  [address] [phone]                Signed: Arley SHAUNNA Helling 02/11/2024, 7:57 AM

## 2024-02-11 NOTE — Progress Notes (Signed)
 Pt unable to have surgery as scheduled on 7/28. Pt d/c'd to home and will return 08/04 for rescheduled surgical procedure.

## 2024-02-14 ENCOUNTER — Encounter (HOSPITAL_COMMUNITY): Payer: Self-pay | Admitting: Neurosurgery

## 2024-02-14 ENCOUNTER — Other Ambulatory Visit: Payer: Self-pay

## 2024-02-14 NOTE — Progress Notes (Signed)
 SDW call  Patient was given pre-op instructions over the phone. Patient verbalized understanding of instructions provided.     PCP - [facility] Cardiologist - denies Pulmonary:    PPM/ICD - denies Device Orders - na Rep Notified - na   Chest x-ray - na EKG -  DOS, 02/17/2024 Stress Test - ECHO -  Cardiac Cath -  PFT: 02/15/2012  Sleep Study/sleep apnea/CPAP: OSA with nightly CPAP  Type II Diabetic.  Patient denies. Last A1C 4.8 02/03/2024. Does not check his sugar Fasting Blood sugar range: na How often check sugars: na Farxiga , hold 72 hours, states last dose 02/14/2024 Mounjaro, states last dose 02/07/2024   Blood Thinner Instructions: denies Aspirin  Instructions:denies   ERAS Protcol - Clears until 0930  Anesthesia review: No   Patient denies shortness of breath, fever, cough and chest pain over the phone call  Your procedure is scheduled on Monday February 17, 2024  Report to [facility] Main Entrance A at  1000  A.M., then check in with the Admitting office.  Call this number if you have problems the morning of surgery:  [phone]   If you have any questions prior to your surgery date call [phone]: Open Monday-Friday 8am-4pm If you experience any cold or flu symptoms such as cough, fever, chills, shortness of breath, etc. between now and your scheduled surgery, please notify us  at the above number    Remember:  Do not eat after midnight the night before your surgery  You may drink clear liquids until  0930   the morning of your surgery.   Clear liquids allowed are: Water, Non-Citrus Juices (without pulp), Carbonated Beverages, Clear Tea, Black Coffee ONLY (NO MILK, CREAM OR POWDERED CREAMER of any kind), and Gatorade   Take these medicines the morning of surgery with A SIP OF WATER:  Gabapentin , crestor   As needed: Flexeril , Robaxin , oxycodone , travadol, valtrex   As of today, STOP taking any Aspirin  (unless otherwise instructed by your  surgeon) Aleve, Naproxen, Ibuprofen , Motrin , Advil , Goody's, BC's, all herbal medications, fish oil, and all vitamins.

## 2024-02-15 ENCOUNTER — Ambulatory Visit: Admitting: Podiatry

## 2024-02-17 ENCOUNTER — Encounter (HOSPITAL_COMMUNITY): Payer: Self-pay | Admitting: Neurosurgery

## 2024-02-17 ENCOUNTER — Inpatient Hospital Stay (HOSPITAL_COMMUNITY): Admitting: Anesthesiology

## 2024-02-17 ENCOUNTER — Inpatient Hospital Stay (HOSPITAL_COMMUNITY)
Admission: RE | Admit: 2024-02-17 | Discharge: 2024-02-19 | DRG: 427 | Disposition: A | Discharge status: Home / Self Care | Attending: Neurosurgery | Admitting: Neurosurgery

## 2024-02-17 ENCOUNTER — Inpatient Hospital Stay (HOSPITAL_COMMUNITY)

## 2024-02-17 ENCOUNTER — Encounter (HOSPITAL_COMMUNITY)
Admission: RE | Disposition: A | Discharge status: Home / Self Care | Payer: Self-pay | Source: Home / Self Care | Attending: Neurosurgery

## 2024-02-17 ENCOUNTER — Other Ambulatory Visit: Payer: Self-pay

## 2024-02-17 DIAGNOSIS — M48061 Spinal stenosis, lumbar region without neurogenic claudication: Secondary | ICD-10-CM

## 2024-02-17 DIAGNOSIS — Z91018 Allergy to other foods: Secondary | ICD-10-CM

## 2024-02-17 DIAGNOSIS — Z87891 Personal history of nicotine dependence: Secondary | ICD-10-CM

## 2024-02-17 DIAGNOSIS — Z79891 Long term (current) use of opiate analgesic: Secondary | ICD-10-CM | POA: Diagnosis not present

## 2024-02-17 DIAGNOSIS — E1151 Type 2 diabetes mellitus with diabetic peripheral angiopathy without gangrene: Secondary | ICD-10-CM | POA: Diagnosis not present

## 2024-02-17 DIAGNOSIS — Z791 Long term (current) use of non-steroidal anti-inflammatories (NSAID): Secondary | ICD-10-CM

## 2024-02-17 DIAGNOSIS — M5126 Other intervertebral disc displacement, lumbar region: Secondary | ICD-10-CM | POA: Diagnosis present

## 2024-02-17 DIAGNOSIS — M4807 Spinal stenosis, lumbosacral region: Secondary | ICD-10-CM | POA: Diagnosis present

## 2024-02-17 DIAGNOSIS — Z6841 Body Mass Index (BMI) 40.0 and over, adult: Secondary | ICD-10-CM | POA: Diagnosis not present

## 2024-02-17 DIAGNOSIS — E66813 Obesity, class 3: Secondary | ICD-10-CM | POA: Diagnosis present

## 2024-02-17 DIAGNOSIS — E119 Type 2 diabetes mellitus without complications: Secondary | ICD-10-CM | POA: Diagnosis present

## 2024-02-17 DIAGNOSIS — M4057 Lordosis, unspecified, lumbosacral region: Secondary | ICD-10-CM | POA: Diagnosis present

## 2024-02-17 DIAGNOSIS — G4733 Obstructive sleep apnea (adult) (pediatric): Secondary | ICD-10-CM | POA: Diagnosis present

## 2024-02-17 DIAGNOSIS — Z96652 Presence of left artificial knee joint: Secondary | ICD-10-CM | POA: Diagnosis present

## 2024-02-17 DIAGNOSIS — F32A Depression, unspecified: Secondary | ICD-10-CM | POA: Diagnosis present

## 2024-02-17 DIAGNOSIS — Z79899 Other long term (current) drug therapy: Secondary | ICD-10-CM | POA: Diagnosis not present

## 2024-02-17 DIAGNOSIS — Z91040 Latex allergy status: Secondary | ICD-10-CM

## 2024-02-17 DIAGNOSIS — Z7984 Long term (current) use of oral hypoglycemic drugs: Secondary | ICD-10-CM

## 2024-02-17 DIAGNOSIS — M1990 Unspecified osteoarthritis, unspecified site: Secondary | ICD-10-CM | POA: Diagnosis present

## 2024-02-17 DIAGNOSIS — Z7985 Long-term (current) use of injectable non-insulin antidiabetic drugs: Secondary | ICD-10-CM

## 2024-02-17 DIAGNOSIS — Z205 Contact with and (suspected) exposure to viral hepatitis: Secondary | ICD-10-CM | POA: Diagnosis present

## 2024-02-17 DIAGNOSIS — M549 Dorsalgia, unspecified: Secondary | ICD-10-CM | POA: Diagnosis present

## 2024-02-17 DIAGNOSIS — Z89422 Acquired absence of other left toe(s): Secondary | ICD-10-CM | POA: Diagnosis not present

## 2024-02-17 DIAGNOSIS — K2270 Barrett's esophagus without dysplasia: Secondary | ICD-10-CM | POA: Diagnosis present

## 2024-02-17 DIAGNOSIS — Z89421 Acquired absence of other right toe(s): Secondary | ICD-10-CM

## 2024-02-17 DIAGNOSIS — I10 Essential (primary) hypertension: Secondary | ICD-10-CM | POA: Diagnosis not present

## 2024-02-17 DIAGNOSIS — J45909 Unspecified asthma, uncomplicated: Secondary | ICD-10-CM | POA: Diagnosis present

## 2024-02-17 LAB — CBC
HCT: 34.8 % — ABNORMAL LOW (ref 39.0–52.0)
Hemoglobin: 11.4 g/dL — ABNORMAL LOW (ref 13.0–17.0)
MCH: 30.8 pg (ref 26.0–34.0)
MCHC: 32.8 g/dL (ref 30.0–36.0)
MCV: 94.1 fL (ref 80.0–100.0)
Platelets: 157 K/uL (ref 150–400)
RBC: 3.7 MIL/uL — ABNORMAL LOW (ref 4.22–5.81)
RDW: 14 % (ref 11.5–15.5)
WBC: 19.9 K/uL — ABNORMAL HIGH (ref 4.0–10.5)
nRBC: 0 % (ref 0.0–0.2)

## 2024-02-17 LAB — TYPE AND SCREEN
ABO/RH(D): O POS
Antibody Screen: NEGATIVE

## 2024-02-17 LAB — GLUCOSE, CAPILLARY
Glucose-Capillary: 100 mg/dL — ABNORMAL HIGH (ref 70–99)
Glucose-Capillary: 119 mg/dL — ABNORMAL HIGH (ref 70–99)
Glucose-Capillary: 157 mg/dL — ABNORMAL HIGH (ref 70–99)

## 2024-02-17 SURGERY — POSTERIOR LUMBAR FUSION 2 LEVEL
Anesthesia: General | Site: Back

## 2024-02-17 MED ORDER — FENTANYL CITRATE (PF) 100 MCG/2ML IJ SOLN
25.0000 ug | INTRAMUSCULAR | Status: DC | PRN
  Administered 2024-02-17 (×3): 50 ug via INTRAVENOUS

## 2024-02-17 MED ORDER — HYDROMORPHONE HCL 1 MG/ML IJ SOLN
INTRAMUSCULAR | Status: AC
  Filled 2024-02-17: qty 0.5

## 2024-02-17 MED ORDER — HYDROCODONE-ACETAMINOPHEN 5-325 MG PO TABS: 2.0000 | ORAL_TABLET | ORAL | Status: DC | PRN

## 2024-02-17 MED ORDER — MIDAZOLAM HCL 2 MG/2ML IJ SOLN
INTRAMUSCULAR | Status: DC | PRN
  Administered 2024-02-17: 2 mg via INTRAVENOUS

## 2024-02-17 MED ORDER — ROSUVASTATIN CALCIUM 5 MG PO TABS
5.0000 mg | ORAL_TABLET | Freq: Every day | ORAL | Status: DC
  Administered 2024-02-18 – 2024-02-19 (×2): 5 mg via ORAL
  Filled 2024-02-17 (×2): qty 1

## 2024-02-17 MED ORDER — PROPOFOL 10 MG/ML IV BOLUS
INTRAVENOUS | Status: DC | PRN
  Administered 2024-02-17: 160 mg via INTRAVENOUS

## 2024-02-17 MED ORDER — ONDANSETRON HCL 4 MG PO TABS: 4.0000 mg | ORAL_TABLET | Freq: Four times a day (QID) | ORAL | Status: DC | PRN

## 2024-02-17 MED ORDER — MIDAZOLAM HCL 2 MG/2ML IJ SOLN
INTRAMUSCULAR | Status: AC
  Filled 2024-02-17: qty 2

## 2024-02-17 MED ORDER — PHENOL 1.4 % MT LIQD: 1.0000 | OROMUCOSAL | Status: DC | PRN

## 2024-02-17 MED ORDER — METHOCARBAMOL 750 MG PO TABS
750.0000 mg | ORAL_TABLET | Freq: Three times a day (TID) | ORAL | Status: DC
  Administered 2024-02-18 – 2024-02-19 (×4): 750 mg via ORAL
  Filled 2024-02-17 (×4): qty 1

## 2024-02-17 MED ORDER — LACTATED RINGERS IV SOLN: INTRAVENOUS | Status: DC

## 2024-02-17 MED ORDER — SODIUM CHLORIDE 0.9% FLUSH
3.0000 mL | Freq: Two times a day (BID) | INTRAVENOUS | Status: DC
  Administered 2024-02-18 – 2024-02-19 (×3): 3 mL via INTRAVENOUS

## 2024-02-17 MED ORDER — ONDANSETRON HCL 4 MG/2ML IJ SOLN: 4.0000 mg | Freq: Once | INTRAMUSCULAR | Status: DC | PRN

## 2024-02-17 MED ORDER — VITAMIN C 500 MG PO TABS
500.0000 mg | ORAL_TABLET | Freq: Every day | ORAL | Status: DC
  Administered 2024-02-18 – 2024-02-19 (×2): 500 mg via ORAL
  Filled 2024-02-17 (×2): qty 1

## 2024-02-17 MED ORDER — DEXAMETHASONE SODIUM PHOSPHATE 10 MG/ML IJ SOLN
INTRAMUSCULAR | Status: DC | PRN
  Administered 2024-02-17: 5 mg via INTRAVENOUS

## 2024-02-17 MED ORDER — LIDOCAINE 2% (20 MG/ML) 5 ML SYRINGE
INTRAMUSCULAR | Status: AC
  Filled 2024-02-17: qty 5

## 2024-02-17 MED ORDER — OXYCODONE HCL 5 MG/5ML PO SOLN
ORAL | Status: AC
  Filled 2024-02-17: qty 5

## 2024-02-17 MED ORDER — ALBUMIN HUMAN 5 % IV SOLN
12.5000 g | Freq: Once | INTRAVENOUS | Status: AC
  Administered 2024-02-17: 12.5 g via INTRAVENOUS

## 2024-02-17 MED ORDER — ACETAMINOPHEN 650 MG RE SUPP: 650.0000 mg | RECTAL | Status: DC | PRN

## 2024-02-17 MED ORDER — LIDOCAINE-EPINEPHRINE 1 %-1:100000 IJ SOLN
INTRAMUSCULAR | Status: DC | PRN
  Administered 2024-02-17: 10 mL

## 2024-02-17 MED ORDER — NALOXONE HCL 4 MG/0.1ML NA LIQD: 1.0000 | Freq: Every day | NASAL | Status: DC | PRN

## 2024-02-17 MED ORDER — SUGAMMADEX SODIUM 200 MG/2ML IV SOLN
INTRAVENOUS | Status: DC | PRN
  Administered 2024-02-17: 400 mg via INTRAVENOUS

## 2024-02-17 MED ORDER — ONDANSETRON HCL 4 MG/2ML IJ SOLN
INTRAMUSCULAR | Status: AC
  Filled 2024-02-17: qty 2

## 2024-02-17 MED ORDER — FENTANYL CITRATE (PF) 250 MCG/5ML IJ SOLN
INTRAMUSCULAR | Status: AC
  Filled 2024-02-17: qty 5

## 2024-02-17 MED ORDER — VITAMIN A 3 MG (10000 UNIT) PO CAPS
10000.0000 [IU] | ORAL_CAPSULE | Freq: Every day | ORAL | Status: DC
  Administered 2024-02-18 – 2024-02-19 (×2): 10000 [IU] via ORAL
  Filled 2024-02-17 (×2): qty 1

## 2024-02-17 MED ORDER — ALBUMIN HUMAN 5 % IV SOLN
INTRAVENOUS | Status: AC
  Filled 2024-02-17: qty 500

## 2024-02-17 MED ORDER — OXYCODONE HCL 5 MG/5ML PO SOLN
5.0000 mg | Freq: Once | ORAL | Status: AC | PRN
  Administered 2024-02-17: 5 mg via ORAL

## 2024-02-17 MED ORDER — APPLE CIDER VINEGAR 500 MG PO TABS: ORAL_TABLET | Freq: Two times a day (BID) | ORAL | Status: DC

## 2024-02-17 MED ORDER — MAGNESIUM OXIDE -MG SUPPLEMENT 400 (240 MG) MG PO TABS
400.0000 mg | ORAL_TABLET | Freq: Every evening | ORAL | Status: DC
  Administered 2024-02-18: 400 mg via ORAL
  Filled 2024-02-17: qty 1

## 2024-02-17 MED ORDER — FENTANYL CITRATE (PF) 100 MCG/2ML IJ SOLN
INTRAMUSCULAR | Status: AC
Start: 2024-02-17 — End: 2024-02-17
  Filled 2024-02-17: qty 2

## 2024-02-17 MED ORDER — POTASSIUM CHLORIDE CRYS ER 10 MEQ PO TBCR
5.0000 meq | EXTENDED_RELEASE_TABLET | Freq: Two times a day (BID) | ORAL | Status: DC
  Administered 2024-02-18 – 2024-02-19 (×3): 5 meq via ORAL
  Filled 2024-02-17 (×3): qty 1

## 2024-02-17 MED ORDER — GABAPENTIN 300 MG PO CAPS
300.0000 mg | ORAL_CAPSULE | Freq: Three times a day (TID) | ORAL | Status: DC
  Administered 2024-02-18 – 2024-02-19 (×4): 300 mg via ORAL
  Filled 2024-02-17 (×4): qty 1

## 2024-02-17 MED ORDER — ROCURONIUM BROMIDE 10 MG/ML (PF) SYRINGE
PREFILLED_SYRINGE | INTRAVENOUS | Status: AC
  Filled 2024-02-17: qty 10

## 2024-02-17 MED ORDER — VITAMIN D3 25 MCG (1000 UNIT) PO TABS
5000.0000 [IU] | ORAL_TABLET | Freq: Every day | ORAL | Status: DC
  Administered 2024-02-18 – 2024-02-19 (×2): 5000 [IU] via ORAL
  Filled 2024-02-17 (×4): qty 5

## 2024-02-17 MED ORDER — ALBUMIN HUMAN 5 % IV SOLN: INTRAVENOUS | Status: DC | PRN

## 2024-02-17 MED ORDER — TRAMADOL HCL 50 MG PO TABS
100.0000 mg | ORAL_TABLET | Freq: Four times a day (QID) | ORAL | Status: DC
  Administered 2024-02-18: 100 mg via ORAL
  Filled 2024-02-17: qty 2

## 2024-02-17 MED ORDER — ROCURONIUM BROMIDE 10 MG/ML (PF) SYRINGE
PREFILLED_SYRINGE | INTRAVENOUS | Status: DC | PRN
  Administered 2024-02-17: 50 mg via INTRAVENOUS
  Administered 2024-02-17 (×2): 30 mg via INTRAVENOUS
  Administered 2024-02-17: 20 mg via INTRAVENOUS
  Administered 2024-02-17: 30 mg via INTRAVENOUS
  Administered 2024-02-17: 20 mg via INTRAVENOUS

## 2024-02-17 MED ORDER — OXYCODONE HCL 5 MG PO TABS: 5.0000 mg | ORAL_TABLET | Freq: Once | ORAL | Status: AC | PRN

## 2024-02-17 MED ORDER — OXYCODONE HCL 5 MG PO TABS
5.0000 mg | ORAL_TABLET | ORAL | Status: DC | PRN
  Administered 2024-02-17 – 2024-02-19 (×8): 10 mg via ORAL
  Filled 2024-02-17 (×8): qty 2

## 2024-02-17 MED ORDER — MUPIROCIN 2 % EX OINT: 1.0000 | TOPICAL_OINTMENT | Freq: Two times a day (BID) | CUTANEOUS | 0 refills | Status: AC

## 2024-02-17 MED ORDER — VALACYCLOVIR HCL 500 MG PO TABS
1000.0000 mg | ORAL_TABLET | Freq: Every day | ORAL | Status: DC
  Filled 2024-02-17: qty 2

## 2024-02-17 MED ORDER — MENTHOL 3 MG MT LOZG: 1.0000 | LOZENGE | OROMUCOSAL | Status: DC | PRN

## 2024-02-17 MED ORDER — TIRZEPATIDE 15 MG/0.5ML ~~LOC~~ SOAJ: 15.0000 mg | SUBCUTANEOUS | Status: DC

## 2024-02-17 MED ORDER — THROMBIN 20000 UNITS EX SOLR: CUTANEOUS | Status: DC | PRN

## 2024-02-17 MED ORDER — INSULIN ASPART 100 UNIT/ML IJ SOLN: 0.0000 [IU] | INTRAMUSCULAR | Status: DC | PRN

## 2024-02-17 MED ORDER — INSULIN ASPART 100 UNIT/ML IJ SOLN
0.0000 [IU] | Freq: Three times a day (TID) | INTRAMUSCULAR | Status: DC
  Administered 2024-02-18 (×3): 2 [IU] via SUBCUTANEOUS
  Administered 2024-02-19: 3 [IU] via SUBCUTANEOUS

## 2024-02-17 MED ORDER — FUROSEMIDE 20 MG PO TABS: 20.0000 mg | ORAL_TABLET | Freq: Every day | ORAL | Status: DC | PRN

## 2024-02-17 MED ORDER — 0.9 % SODIUM CHLORIDE (POUR BTL) OPTIME
TOPICAL | Status: DC | PRN
  Administered 2024-02-17: 1000 mL

## 2024-02-17 MED ORDER — GINSENG 100 MG PO CAPS: ORAL_CAPSULE | ORAL | Status: DC

## 2024-02-17 MED ORDER — CEFAZOLIN SODIUM-DEXTROSE 2-4 GM/100ML-% IV SOLN
2.0000 g | Freq: Three times a day (TID) | INTRAVENOUS | Status: AC
  Administered 2024-02-17 – 2024-02-18 (×2): 2 g via INTRAVENOUS
  Filled 2024-02-17 (×2): qty 100

## 2024-02-17 MED ORDER — ONDANSETRON HCL 4 MG/2ML IJ SOLN
INTRAMUSCULAR | Status: DC | PRN
  Administered 2024-02-17: 4 mg via INTRAVENOUS

## 2024-02-17 MED ORDER — CHLORHEXIDINE GLUCONATE 4 % EX SOLN: 1.0000 | CUTANEOUS | 1 refills | Status: AC

## 2024-02-17 MED ORDER — BUPIVACAINE LIPOSOME 1.3 % IJ SUSP
INTRAMUSCULAR | Status: AC
Start: 2024-02-17 — End: 2024-02-17
  Filled 2024-02-17: qty 20

## 2024-02-17 MED ORDER — ACETAMINOPHEN 325 MG PO TABS
650.0000 mg | ORAL_TABLET | ORAL | Status: DC | PRN
Start: 2024-02-17 — End: 2024-02-19

## 2024-02-17 MED ORDER — FENTANYL CITRATE (PF) 250 MCG/5ML IJ SOLN
INTRAMUSCULAR | Status: DC | PRN
  Administered 2024-02-17: 100 ug via INTRAVENOUS
  Administered 2024-02-17 (×3): 50 ug via INTRAVENOUS
  Administered 2024-02-17 (×2): 100 ug via INTRAVENOUS
  Administered 2024-02-17: 50 ug via INTRAVENOUS

## 2024-02-17 MED ORDER — SODIUM CHLORIDE 0.9 % IV SOLN: 250.0000 mL | INTRAVENOUS | Status: AC

## 2024-02-17 MED ORDER — DEXAMETHASONE SODIUM PHOSPHATE 10 MG/ML IJ SOLN
INTRAMUSCULAR | Status: AC
  Filled 2024-02-17: qty 1

## 2024-02-17 MED ORDER — KETAMINE HCL 50 MG/5ML IJ SOSY
PREFILLED_SYRINGE | INTRAMUSCULAR | Status: AC
  Filled 2024-02-17: qty 5

## 2024-02-17 MED ORDER — PANTOPRAZOLE SODIUM 40 MG PO TBEC
40.0000 mg | DELAYED_RELEASE_TABLET | Freq: Every day | ORAL | Status: DC
  Administered 2024-02-17 – 2024-02-18 (×2): 40 mg via ORAL
  Filled 2024-02-17 (×2): qty 1

## 2024-02-17 MED ORDER — ACETAMINOPHEN 500 MG PO TABS
1000.0000 mg | ORAL_TABLET | Freq: Once | ORAL | Status: AC
  Administered 2024-02-17: 1000 mg via ORAL
  Filled 2024-02-17: qty 2

## 2024-02-17 MED ORDER — CYCLOBENZAPRINE HCL 10 MG PO TABS
10.0000 mg | ORAL_TABLET | Freq: Three times a day (TID) | ORAL | Status: DC | PRN
  Administered 2024-02-18: 10 mg via ORAL
  Filled 2024-02-17: qty 1

## 2024-02-17 MED ORDER — DEXTROSE 5 % IV SOLN
INTRAVENOUS | Status: DC | PRN
  Administered 2024-02-17 (×2): 3 g via INTRAVENOUS

## 2024-02-17 MED ORDER — SODIUM CHLORIDE 0.9% FLUSH: 3.0000 mL | INTRAVENOUS | Status: DC | PRN

## 2024-02-17 MED ORDER — PROPOFOL 10 MG/ML IV BOLUS
INTRAVENOUS | Status: AC
  Filled 2024-02-17: qty 20

## 2024-02-17 MED ORDER — HYDROMORPHONE HCL 1 MG/ML IJ SOLN: 0.5000 mg | INTRAMUSCULAR | Status: DC | PRN

## 2024-02-17 MED ORDER — HYDROMORPHONE HCL 1 MG/ML IJ SOLN
INTRAMUSCULAR | Status: DC | PRN
  Administered 2024-02-17 (×2): .5 mg via INTRAVENOUS

## 2024-02-17 MED ORDER — FENTANYL CITRATE (PF) 100 MCG/2ML IJ SOLN: 50.0000 ug | Freq: Once | INTRAMUSCULAR | Status: AC

## 2024-02-17 MED ORDER — ALUM & MAG HYDROXIDE-SIMETH 200-200-20 MG/5ML PO SUSP: 30.0000 mL | Freq: Four times a day (QID) | ORAL | Status: DC | PRN

## 2024-02-17 MED ORDER — DEXMEDETOMIDINE HCL IN NACL 80 MCG/20ML IV SOLN
INTRAVENOUS | Status: DC | PRN
  Administered 2024-02-17 (×2): 12 ug via INTRAVENOUS
  Administered 2024-02-17: 8 ug via INTRAVENOUS

## 2024-02-17 MED ORDER — BUPIVACAINE LIPOSOME 1.3 % IJ SUSP
INTRAMUSCULAR | Status: DC | PRN
  Administered 2024-02-17: 20 mL

## 2024-02-17 MED ORDER — LIDOCAINE-EPINEPHRINE 1 %-1:100000 IJ SOLN
INTRAMUSCULAR | Status: AC
Start: 2024-02-17 — End: 2024-02-17
  Filled 2024-02-17: qty 1

## 2024-02-17 MED ORDER — THROMBIN 20000 UNITS EX SOLR
CUTANEOUS | Status: AC
  Filled 2024-02-17: qty 20000

## 2024-02-17 MED ORDER — DAPAGLIFLOZIN PROPANEDIOL 10 MG PO TABS
10.0000 mg | ORAL_TABLET | Freq: Every day | ORAL | Status: DC
Start: 2024-02-18 — End: 2024-02-19
  Administered 2024-02-18 – 2024-02-19 (×2): 10 mg via ORAL
  Filled 2024-02-17 (×2): qty 1

## 2024-02-17 MED ORDER — PANTOPRAZOLE SODIUM 40 MG IV SOLR: 40.0000 mg | Freq: Every day | INTRAVENOUS | Status: DC

## 2024-02-17 MED ORDER — KETAMINE HCL 10 MG/ML IJ SOLN
INTRAMUSCULAR | Status: DC | PRN
Start: 2024-02-17 — End: 2024-02-17
  Administered 2024-02-17 (×2): 20 mg via INTRAVENOUS
  Administered 2024-02-17: 10 mg via INTRAVENOUS

## 2024-02-17 MED ORDER — ONDANSETRON HCL 4 MG/2ML IJ SOLN: 4.0000 mg | Freq: Four times a day (QID) | INTRAMUSCULAR | Status: DC | PRN

## 2024-02-17 MED ORDER — LIDOCAINE 2% (20 MG/ML) 5 ML SYRINGE
INTRAMUSCULAR | Status: DC | PRN
  Administered 2024-02-17: 60 mg via INTRAVENOUS

## 2024-02-17 MED ORDER — LIDOCAINE 2% (20 MG/ML) 5 ML SYRINGE: INTRAMUSCULAR | Status: DC | PRN

## 2024-02-17 MED ORDER — CHLORHEXIDINE GLUCONATE 0.12 % MT SOLN
15.0000 mL | OROMUCOSAL | Status: AC
  Administered 2024-02-17: 15 mL via OROMUCOSAL
  Filled 2024-02-17: qty 15

## 2024-02-17 MED ORDER — FENTANYL CITRATE (PF) 100 MCG/2ML IJ SOLN
INTRAMUSCULAR | Status: AC
  Administered 2024-02-17: 50 ug
  Filled 2024-02-17: qty 2

## 2024-02-17 MED ORDER — CEFAZOLIN SODIUM 1 G IJ SOLR
INTRAMUSCULAR | Status: AC
  Filled 2024-02-17: qty 30

## 2024-02-17 SURGICAL SUPPLY — 62 items
BAG COUNTER SPONGE SURGICOUNT (BAG) ×1 IMPLANT
BASKET BONE COLLECTION (BASKET) ×1 IMPLANT
BENZOIN TINCTURE PRP APPL 2/3 (GAUZE/BANDAGES/DRESSINGS) ×1 IMPLANT
BLADE BONE MILL MEDIUM (MISCELLANEOUS) ×1 IMPLANT
BLADE CLIPPER SURG (BLADE) IMPLANT
BLADE SURG 11 STRL SS (BLADE) ×1 IMPLANT
BUR CUTTER 7.0 ROUND (BURR) ×1 IMPLANT
BUR MATCHSTICK NEURO 3.0 LAGG (BURR) ×1 IMPLANT
CANISTER SUCTION 3000ML PPV (SUCTIONS) ×1 IMPLANT
CNTNR URN SCR LID CUP LEK RST (MISCELLANEOUS) ×1 IMPLANT
COVER BACK TABLE 60X90IN (DRAPES) ×1 IMPLANT
DERMABOND ADVANCED .7 DNX12 (GAUZE/BANDAGES/DRESSINGS) ×1 IMPLANT
DRAPE C-ARM 42X72 X-RAY (DRAPES) ×1 IMPLANT
DRAPE C-ARMOR (DRAPES) IMPLANT
DRAPE HALF SHEET 40X57 (DRAPES) IMPLANT
DRAPE LAPAROTOMY 100X72X124 (DRAPES) ×1 IMPLANT
DRAPE SURG 17X23 STRL (DRAPES) ×1 IMPLANT
DRSG OPSITE 4X5.5 SM (GAUZE/BANDAGES/DRESSINGS) ×1 IMPLANT
DRSG OPSITE POSTOP 4X6 (GAUZE/BANDAGES/DRESSINGS) ×1 IMPLANT
DURAPREP 26ML APPLICATOR (WOUND CARE) ×1 IMPLANT
ELECTRODE REM PT RTRN 9FT ADLT (ELECTROSURGICAL) ×1 IMPLANT
EVACUATOR 1/8 PVC DRAIN (DRAIN) ×1 IMPLANT
GAUZE 4X4 16PLY ~~LOC~~+RFID DBL (SPONGE) IMPLANT
GAUZE SPONGE 4X4 12PLY STRL (GAUZE/BANDAGES/DRESSINGS) ×1 IMPLANT
GLOVE BIO SURGEON STRL SZ7 (GLOVE) IMPLANT
GLOVE BIO SURGEON STRL SZ8 (GLOVE) ×2 IMPLANT
GLOVE BIOGEL PI IND STRL 7.0 (GLOVE) IMPLANT
GLOVE EXAM NITRILE XL STR (GLOVE) IMPLANT
GLOVE INDICATOR 8.5 STRL (GLOVE) ×2 IMPLANT
GOWN STRL REUS W/ TWL LRG LVL3 (GOWN DISPOSABLE) IMPLANT
GOWN STRL REUS W/ TWL XL LVL3 (GOWN DISPOSABLE) ×2 IMPLANT
GOWN STRL REUS W/TWL 2XL LVL3 (GOWN DISPOSABLE) IMPLANT
GRAFT BNE MATRIX VG FRMBL MD 5 (Bone Implant) IMPLANT
KIT BASIN OR (CUSTOM PROCEDURE TRAY) ×1 IMPLANT
KIT TURNOVER KIT B (KITS) ×1 IMPLANT
MILL BONE PREP (MISCELLANEOUS) ×1 IMPLANT
NDL HYPO 21X1.5 SAFETY (NEEDLE) ×1 IMPLANT
NDL HYPO 25X1 1.5 SAFETY (NEEDLE) ×1 IMPLANT
NEEDLE HYPO 21X1.5 SAFETY (NEEDLE) ×1 IMPLANT
NEEDLE HYPO 25X1 1.5 SAFETY (NEEDLE) ×1 IMPLANT
NS IRRIG 1000ML POUR BTL (IV SOLUTION) ×1 IMPLANT
PACK LAMINECTOMY NEURO (CUSTOM PROCEDURE TRAY) ×1 IMPLANT
PAD ARMBOARD POSITIONER FOAM (MISCELLANEOUS) ×3 IMPLANT
PATTIES SURGICAL 1X1 (DISPOSABLE) IMPLANT
ROD PREBENT EXP 5.5X90 NS (Rod) IMPLANT
SCREW CORT FIX FEN 5.5X6X40MM (Screw) IMPLANT
SCREW CORT FIX FEN 5.5X6X45MM (Screw) IMPLANT
SCREW SET SINGLE INNER (Screw) IMPLANT
SPACER SUSTAIN RT 12 8D 9X26 (Spacer) IMPLANT
SPIKE FLUID TRANSFER (MISCELLANEOUS) ×1 IMPLANT
SPONGE SURGIFOAM ABS GEL 100 (HEMOSTASIS) ×1 IMPLANT
SPONGE T-LAP 4X18 ~~LOC~~+RFID (SPONGE) IMPLANT
STRIP CLOSURE SKIN 1/2X4 (GAUZE/BANDAGES/DRESSINGS) ×2 IMPLANT
SUT VIC AB 0 CT1 18XCR BRD8 (SUTURE) ×1 IMPLANT
SUT VIC AB 2-0 CT1 18 (SUTURE) ×1 IMPLANT
SUT VIC AB 4-0 PS2 27 (SUTURE) ×1 IMPLANT
SYR 20ML LL LF (SYRINGE) ×1 IMPLANT
TAP EXPEDIUM DL 5.0 (INSTRUMENTS) IMPLANT
TOWEL GREEN STERILE (TOWEL DISPOSABLE) ×1 IMPLANT
TOWEL GREEN STERILE FF (TOWEL DISPOSABLE) ×1 IMPLANT
TRAY FOLEY MTR SLVR 16FR STAT (SET/KITS/TRAYS/PACK) ×1 IMPLANT
WATER STERILE IRR 1000ML POUR (IV SOLUTION) ×1 IMPLANT

## 2024-02-17 NOTE — OR Nursing (Addendum)
 4 taps and 2 rods, 6x19mm screw explanted from lumbar spine

## 2024-02-17 NOTE — H&P (Signed)
 Robert [name] is an 68 y.o. male.   Chief Complaint: Back and right leg pain HPI: 68 year old gentleman admitted to the hospital from the emergency room 2 weeks ago with severe back and right leg pain refractory to IV steroids and epidural steroid injection workup revealed severe spinal stenosis at L3-4 L4-5 L5-S1 with a large disc herniation on the right at L5-S1.  Due to his failed conservative treatment imaging findings and progression of clinical syndrome we recommended decompression stabilization procedure at those levels.  We extensively reviewed the risk and benefits of the operation with the patient as well as perioperative course expectations of outcome and alternatives to surgery and he understands and agrees to proceed forward.  Past Medical History:  Diagnosis Date   Allergic rhinitis due to pollen    Arthritis    Asthma    Back pain    Barrett esophagus    Depressive disorder    Diabetes mellitus type 2 in obese    Exposure to hepatitis B    Exposure to hepatitis C    Narcotic abuse (HCC)    pain medications   OSA (obstructive sleep apnea)    on CPAP     Past Surgical History:  Procedure Laterality Date   ABDOMINAL SURGERY     AMPUTATION Right 06/11/2018   Procedure: RIGHT 2ND RAY AMPUTATION;  Surgeon: Harden Jerona GAILS, MD;  Location: [facility] OR;  Service: Orthopedics;  Laterality: Right;   AMPUTATION TOE Left 06/02/2022   Procedure: LEFT FOURTH PARTIAL TOE AMPUTATION;  Surgeon: Silva Juliene SAUNDERS, DPM;  Location: [facility] ORS;  Service: Podiatry;  Laterality: Left;   AMPUTATION TOE Right 04/11/2023   Procedure: AMPUTATION TOE;  Surgeon: Malvin Marsa FALCON, DPM;  Location: [facility] OR;  Service: Orthopedics/Podiatry;  Laterality: Right;  Right 3rd toe amputation   BACK SURGERY     Rods and screws in lumbar area   COLONOSCOPY     ESOPHAGOGASTRODUODENOSCOPY  multiple   HERNIA REPAIR     6   IR INJECT/THERA/INC NEEDLE/CATH/PLC EPI/LUMB/SAC W/IMG  02/04/2024   TOTAL KNEE ARTHROPLASTY   2009   left    Family History  Problem Relation Age of Onset   Allergies Mother    Early death Father    Thyroid  disease Sister    Colon cancer Neg Hx    Stomach cancer Neg Hx    Social History:  reports that he quit smoking about 19 years ago. His smoking use included cigarettes. He started smoking about 34 years ago. He has a 12 pack-year smoking history. He has never used smokeless tobacco. He reports that he does not drink alcohol and does not use drugs.  Allergies:  Allergies  Allergen Reactions   Onion Other (See Comments)    Stomach Irritation    Latex Rash    With prolonged contact    Medications Prior to Admission  Medication Sig Dispense Refill   APPLE CIDER VINEGAR PO Take 15 mLs by mouth 2 (two) times daily.      B Complex Vitamins (B COMPLEX 100 PO) Take 1 capsule by mouth daily.     Cholecalciferol  (VITAMIN D3) 125 MCG (5000 UT) CAPS Take 5,000 Units by mouth daily.     cyclobenzaprine  (FLEXERIL ) 10 MG tablet Take 5-10 mg by mouth 3 (three) times daily as needed for muscle spasms.     FARXIGA  10 MG TABS tablet Take 10 mg by mouth daily.     furosemide  (LASIX ) 40 MG tablet Take 20-40 mg by  mouth daily as needed for fluid or edema.     gabapentin  (NEURONTIN ) 300 MG capsule Take 1 capsule (300 mg total) by mouth 3 (three) times daily. 90 capsule 2   GINSENG PO Take 1 tablet by mouth 2 (two) times a week.     glucose blood (ONE TOUCH ULTRA TEST) test strip Use to test blood sugar, test in the morning fasting and test after largest meal of the day. 100 each 11   magnesium  oxide (MAG-OX) 400 (240 Mg) MG tablet Take 400 mg by mouth every evening.     meloxicam  (MOBIC ) 15 MG tablet Take 15 mg by mouth daily.     methocarbamol  (ROBAXIN ) 750 MG tablet Take 1 tablet (750 mg total) by mouth 3 (three) times daily. 40 tablet 1   NARCAN  4 MG/0.1ML LIQD nasal spray kit Place 1 spray into the nose daily as needed (overdose).     oxyCODONE  (OXY IR/ROXICODONE ) 5 MG immediate release  tablet Take 3 tablets (15 mg total) by mouth every 4 (four) hours as needed for severe pain (pain score 7-10) or moderate pain (pain score 4-6) (5mg  for moderate pain and 10 mg for severe). (Patient taking differently: Take 5-15 mg by mouth every 4 (four) hours as needed for severe pain (pain score 7-10) or moderate pain (pain score 4-6) (5mg  for moderate pain and 10 mg for severe).) 30 tablet 0   Potassium 99 MG TABS Take 2 tablets by mouth in the morning and at bedtime.     rosuvastatin  (CRESTOR ) 5 MG tablet Take 1 tablet (5 mg total) by mouth daily. 30 tablet 3   tirzepatide  (MOUNJARO ) 15 MG/0.5ML Pen Inject 15 mg into the skin once a week.     traMADol  (ULTRAM ) 50 MG tablet Take 100 mg by mouth 4 (four) times daily.     valACYclovir  (VALTREX ) 1000 MG tablet TAKE 1 TABLET BY MOUTH DAILY AS NEEDED (FOR OUTBREAKS ONLY). 5 tablet 0   Vitamin A  3 MG (10000 UT) TABS Take 1 tablet by mouth daily.     vitamin C  (ASCORBIC ACID ) 500 MG tablet Take 500 mg by mouth daily.     VITAMIN E PO Take 1 tablet by mouth daily.     [Paused] amLODipine -olmesartan  (AZOR ) 5-40 MG tablet Take 1 tablet by mouth daily.     blood glucose meter kit and supplies Dispense based on patient and insurance preference. Use to check glucose fasting in the AM and then after largest meal of the day.. (FOR ICD-10 E10.9, E11.9). 1 each 0   Blood Glucose Monitoring Suppl (ONE TOUCH ULTRA MINI) w/Device KIT Use to test blood sugar, test in the morning fasting and test after largest meal of the day. 1 each 0   ONETOUCH DELICA LANCETS 33G MISC Use for testing blood sugar, test once in the morning fasting and test after largest meal of the day. 100 each 11    Results for orders placed or performed during the hospital encounter of 02/17/24 (from the past 48 hours)  Glucose, capillary     Status: Abnormal   Collection Time: 02/17/24 10:28 AM  Result Value Ref Range   Glucose-Capillary 100 (H) 70 - 99 mg/dL    Comment: Glucose reference  range applies only to samples taken after fasting for at least 8 hours.   No results found.  Review of Systems  Musculoskeletal:  Positive for back pain.  Neurological:  Positive for numbness.    Blood pressure 122/78, pulse 71, temperature 98.5  F (36.9 C), temperature source Oral, resp. rate 17, height 5' 11 (1.803 m), weight (!) 140.6 kg, SpO2 99%. Physical Exam HENT:     Head: Normocephalic.     Right Ear: Tympanic membrane normal.     Nose: Nose normal.     Mouth/Throat:     Mouth: Mucous membranes are moist.  Cardiovascular:     Rate and Rhythm: Normal rate.  Pulmonary:     Effort: Pulmonary effort is normal.  Abdominal:     General: Abdomen is flat.  Musculoskeletal:        General: Normal range of motion.     Cervical back: Normal range of motion.  Skin:    General: Skin is warm.  Neurological:     Mental Status: He is alert.     Comments: Patient is awake alert strength is 5 out of 5 iliopsoas, quads, hamstrings, gastrocs, and tibialis, and EHL.      Assessment/Plan 68 year old presents for decompressive laminectomy interbody fusions L3-4 L4 of S1  Robert SHAUNNA Helling, MD 02/17/2024, 11:33 AM

## 2024-02-17 NOTE — Anesthesia Procedure Notes (Signed)
 Procedure Name: Intubation Date/Time: 02/17/2024 12:52 PM  Performed by: Lucious Debby BRAVO, MDPre-anesthesia Checklist: Patient identified, Emergency Drugs available, Suction available and Patient being monitored Patient Re-evaluated:Patient Re-evaluated prior to induction Oxygen Delivery Method: Circle system utilized Preoxygenation: Pre-oxygenation with 100% oxygen Induction Type: IV induction Ventilation: Mask ventilation without difficulty Laryngoscope Size: Glidescope and 4 Grade View: Grade I Tube type: Oral Tube size: 7.5 mm Number of attempts: 1 Airway Equipment and Method: Stylet and Oral airway Placement Confirmation: ETT inserted through vocal cords under direct vision, positive ETCO2 and breath sounds checked- equal and bilateral Secured at: 24 cm Tube secured with: Tape Dental Injury: Teeth and Oropharynx as per pre-operative assessment

## 2024-02-17 NOTE — Anesthesia Preprocedure Evaluation (Addendum)
 Anesthesia Evaluation  Patient identified by MRN, date of birth, ID band Patient awake    Reviewed: Allergy & Precautions, NPO status , Patient's Chart, lab work & pertinent test results  History of Anesthesia Complications Negative for: history of anesthetic complications  Airway Mallampati: II  TM Distance: >3 FB Neck ROM: Full    Dental  (+) Dental Advisory Given, Partial Upper   Pulmonary asthma , sleep apnea and Continuous Positive Airway Pressure Ventilation , former smoker   Pulmonary exam normal        Cardiovascular hypertension, Pt. on medications + Peripheral Vascular Disease  Normal cardiovascular exam     Neuro/Psych  PSYCHIATRIC DISORDERS  Depression     Neuromuscular disease    GI/Hepatic negative GI ROS,,,(+)     substance abuse    Endo/Other  diabetes, Type 2  Class 3 obesity On GLP-1a Na 132   Renal/GU negative Renal ROS     Musculoskeletal  (+) Arthritis ,  narcotic dependent  Abdominal  (+) + obese  Peds  Hematology negative hematology ROS (+)   Anesthesia Other Findings Chronic pain   Reproductive/Obstetrics                              Anesthesia Physical Anesthesia Plan  ASA: 3  Anesthesia Plan: General   Post-op Pain Management: Tylenol  PO (pre-op)*, Ketamine  IV* and Dilaudid  IV   Induction: Intravenous  PONV Risk Score and Plan: 2 and Treatment may vary due to age or medical condition, Ondansetron  and Dexamethasone   Airway Management Planned: Oral ETT  Additional Equipment: None  Intra-op Plan:   Post-operative Plan: Extubation in OR  Informed Consent: I have reviewed the patients History and Physical, chart, labs and discussed the procedure including the risks, benefits and alternatives for the proposed anesthesia with the patient or authorized representative who has indicated his/her understanding and acceptance.     Dental advisory  given  Plan Discussed with: CRNA and Anesthesiologist  Anesthesia Plan Comments:          Anesthesia Quick Evaluation

## 2024-02-17 NOTE — Transfer of Care (Signed)
 Immediate Anesthesia Transfer of Care Note  Patient: Robert [name]  Procedure(s) Performed: POSTERIOR LUMBAR FUSION 2 LEVEL (Back)  Patient Location: PACU  Anesthesia Type:General  Level of Consciousness: drowsy  Airway & Oxygen Therapy: Patient Spontanous Breathing and Patient connected to face mask oxygen  Post-op Assessment: Report given to RN, Post -op Vital signs reviewed and stable, and Patient moving all extremities  Post vital signs: Reviewed and stable  Last Vitals:  Vitals Value Taken Time  BP 113/63 02/17/24 19:46  Temp 37.6 C 02/17/24 19:45  Pulse 102 02/17/24 19:58  Resp 16 02/17/24 19:58  SpO2 100 % 02/17/24 19:58  Vitals shown include unfiled device data.  Last Pain:  Vitals:   02/17/24 1040  TempSrc:   PainSc: 8          Complications: No notable events documented.

## 2024-02-17 NOTE — Anesthesia Postprocedure Evaluation (Signed)
 Anesthesia Post Note  Patient: Robert [name]  Procedure(s) Performed: POSTERIOR LUMBAR FUSION 2 LEVEL (Back)     Patient location during evaluation: PACU Anesthesia Type: General Level of consciousness: awake and alert Pain management: pain level controlled Vital Signs Assessment: post-procedure vital signs reviewed and stable Respiratory status: spontaneous breathing, nonlabored ventilation and respiratory function stable Cardiovascular status: blood pressure returned to baseline and stable Postop Assessment: no apparent nausea or vomiting Anesthetic complications: no   No notable events documented.  Last Vitals:  Vitals:   02/17/24 2215 02/17/24 2230  BP: (!) 150/126 120/66  Pulse: 91 92  Resp: 13 16  Temp:    SpO2: 98% 94%    Last Pain:  Vitals:   02/17/24 2230  TempSrc:   PainSc: 5                  Erabella Kuipers,W. EDMOND

## 2024-02-17 NOTE — Op Note (Signed)
 Preoperative diagnosis: Lumbar spinal stenosis L3-4 herniated nucleus pulposus lumbar spinal stenosis L5-S1.  Postoperative diagnosis: Same.  Procedure: #1 decompressive lumbar laminectomy L3-4 with complete medial facetectomies and radical foraminotomies of the L3 and L4 nerve roots in excess and requiring more work to remove the standard interbody fusion in addition to decompression with complete facetectomies Rodick foraminotomies the L5 and S1 nerve roots at L5-S1 with redo foraminotomies of the L5 nerve root at L5-S1 and redo foramina of the L4 nerve root at L3-4.  2.  Posterior lumbar interbody fusion L3-4 and L5-S1 utilizing locally harvested autograft mixed with Vivigen and globus titanium cages.  3.  Pedicle screw fixation L3-S1 utilizing DePuy Expedium pedicle screw set with new screws placed at L3 and S1 utilizing existing screws at L4 and removal of bilateral L5 pedicle screws.  4.  Posterolateral arthrodesis L3-4 and L5-S1 using locally harvested autograft mixed with Vivigen.  5.  Exploration fusion removal of hardware L4-5  Surgeon: Arley Zoraida Havrilla  Assistant: Rockey Peru.  Assistant: Suzen Click.  Anesthesia: General.  EBL: Approximately 800 cc.  HPI: 68 year old gentleman with severe spinal stenosis and herniated disc was admitted to the emergency room was stabilized and I attempted to get pain under control with IV steroids and epidural steroid injection success ultimately recommended decompression stabilization procedure at L3-4 and L5-S1.  Also with exploration of fusion removal hardware L4-5.  I extensively over the risks and benefits of the operation with patient as well as perioperative course expectations of outcome and alternatives to surgery he understood and agreed to proceed forward.  Operative procedure: Patient was brought into the OR was induced under general esthesia positioned prone on the Wilson frame his back was prepped and draped in routine sterile fashion  his old incision was opened up and extended slightly cephalad caudally scar tissue was dissected free and subperiosteal dissection carried lamina of L3-4 and L5-S1 exposing the hardware which was encased in bone at L4-5.  Very difficult exposure difficult identifying the hardware because it was covered with brown 5 after we have localized the appropriate levels spinal process at L3 was very spinous process at L5 which removed central decompression was begun all scar tissue was freed complete facetectomies were performed radical foraminotomies of the L4 and L3 nerve roots at L3-4 as well as L5 and S1 nerve roots at L5-S1 with removal of the pars and aggressive under biting of the supratrochlear facet at both levels.  This spaces were then cleaned out large herniated disc on the right was removed this was secondary to underneath the S1 nerve root projecting up towards the L5 nerve root.  This was all removed endplates were prepared and after complete discectomy and endplate preparation cages were inserted bilaterally with extensive mount of autograft material centrally.  I then placed pedicle screws at L3 and S1 all screws and excellent purchase this was all done under fluoroscopy then an attempt to contour the rod it was too steep angle due to his hyperlordosis to utilize his existing 5 screws were heterogeneous then pinned the rod contoured up and gather anchored ending down in place and then laid extensive mount of posterior lateral bowing at L3-4 and L5-S1 after aggressive decortication.  All the foramina reinspected to confirm patency no migration of graft material Gelfoam was over top of dura muscle and fascia approximate layers with interoperative Vicryl after bleeding Hemovac drain was placed and the skin was closed running 4 subcuticular Exparel  was injected in the fascia.  Wound  was closed dressed and patient went to cover him same condition.  Images on the Sponge counts were correct.

## 2024-02-18 LAB — BASIC METABOLIC PANEL WITH GFR
Anion gap: 8 (ref 5–15)
BUN: 22 mg/dL (ref 8–23)
CO2: 24 mmol/L (ref 22–32)
Calcium: 8.1 mg/dL — ABNORMAL LOW (ref 8.9–10.3)
Chloride: 103 mmol/L (ref 98–111)
Creatinine, Ser: 1.1 mg/dL (ref 0.61–1.24)
GFR, Estimated: >60 mL/min (ref >60)
Glucose, Bld: 141 mg/dL — ABNORMAL HIGH (ref 70–99)
Potassium: 4.4 mmol/L (ref 3.5–5.1)
Sodium: 135 mmol/L (ref 135–145)

## 2024-02-18 LAB — CBC WITH DIFFERENTIAL/PLATELET
Abs Immature Granulocytes: 0.09 K/uL — ABNORMAL HIGH (ref 0.00–0.07)
Basophils Absolute: 0 K/uL (ref 0.0–0.1)
Basophils Relative: 0 %
Eosinophils Absolute: 0 K/uL (ref 0.0–0.5)
Eosinophils Relative: 0 %
HCT: 28.9 % — ABNORMAL LOW (ref 39.0–52.0)
Hemoglobin: 9.8 g/dL — ABNORMAL LOW (ref 13.0–17.0)
Immature Granulocytes: 1 %
Lymphocytes Relative: 9 %
Lymphs Abs: 1.1 K/uL (ref 0.7–4.0)
MCH: 30.9 pg (ref 26.0–34.0)
MCHC: 33.9 g/dL (ref 30.0–36.0)
MCV: 91.2 fL (ref 80.0–100.0)
Monocytes Absolute: 1.3 K/uL — ABNORMAL HIGH (ref 0.1–1.0)
Monocytes Relative: 11 %
Neutro Abs: 9.6 K/uL — ABNORMAL HIGH (ref 1.7–7.7)
Neutrophils Relative %: 79 %
Platelets: 112 K/uL — ABNORMAL LOW (ref 150–400)
RBC: 3.17 MIL/uL — ABNORMAL LOW (ref 4.22–5.81)
RDW: 13.6 % (ref 11.5–15.5)
WBC: 12.2 K/uL — ABNORMAL HIGH (ref 4.0–10.5)
nRBC: 0 % (ref 0.0–0.2)

## 2024-02-18 LAB — GLUCOSE, CAPILLARY
Glucose-Capillary: 133 mg/dL — ABNORMAL HIGH (ref 70–99)
Glucose-Capillary: 136 mg/dL — ABNORMAL HIGH (ref 70–99)
Glucose-Capillary: 137 mg/dL — ABNORMAL HIGH (ref 70–99)
Glucose-Capillary: 143 mg/dL — ABNORMAL HIGH (ref 70–99)
Glucose-Capillary: 149 mg/dL — ABNORMAL HIGH (ref 70–99)

## 2024-02-18 MED ORDER — CEPHALEXIN 500 MG PO CAPS
500.0000 mg | ORAL_CAPSULE | Freq: Three times a day (TID) | ORAL | Status: DC
  Administered 2024-02-18 – 2024-02-19 (×2): 500 mg via ORAL
  Filled 2024-02-18 (×4): qty 1

## 2024-02-18 MED FILL — Sodium Chloride IV Soln 0.9%: INTRAVENOUS | Qty: 3000 | Status: AC

## 2024-02-18 MED FILL — Heparin Sodium (Porcine) Inj 1000 Unit/ML: INTRAMUSCULAR | Qty: 30 | Status: AC

## 2024-02-18 NOTE — Evaluation (Signed)
 Physical Therapy Evaluation and Discharge Patient Details Name: Robert [name] MRN: [id number] DOB: [birth year] Today's Date: 02/18/2024  History of Present Illness  Pt is a 68 y/o male admitted for lumbar laminectomy of L3-4, lumbar fusion at L3-4 and L5-S1 and hardware removal at L4-5 in setting of spinal stenosis.PMH includes asthma, allergic rhinitis, chronic back pain, hypertension, hyperlipidemia, Barrett's esophagus, depression, type 2 diabetes, diabetic foot infections/osteomyelitis/toe amputations, OSA on CPAP  Clinical Impression  Patient evaluated by Physical Therapy with no further acute PT needs identified. All education has been completed and the patient has no further questions. Pt from home with family and has adequate support for d/c. Pt ambulated 150' with RW and supervision as well as ascending and descending 4 steps with rails. Pt with good understanding of precautions, posture, and activity level for home. Continues to have some pain and tightness down RLE but reports it is less than before surgery. No further acute PT needs. PT is signing off. Thank you for this referral.         If plan is discharge home, recommend the following: Assistance with cooking/housework;A little help with bathing/dressing/bathroom;Help with stairs or ramp for entrance   Can travel by private vehicle        Equipment Recommendations None recommended by PT  Recommendations for Other Services       Functional Status Assessment Patient has had a recent decline in their functional status and demonstrates the ability to make significant improvements in function in a reasonable and predictable amount of time.     Precautions / Restrictions Precautions Precautions: Back;Fall Precaution Booklet Issued: Yes (comment) Recall of Precautions/Restrictions: Intact Precaution/Restrictions Comments: precautions and posture reviewed Required Braces or Orthoses: Spinal Brace Spinal Brace: Lumbar  corset Restrictions Weight Bearing Restrictions Per Provider Order: No      Mobility  Bed Mobility Overal bed mobility: Modified Independent Bed Mobility: Rolling, Sit to Sidelying Rolling: Modified independent (Device/Increase time) Sidelying to sit: Modified independent (Device/Increase time)       General bed mobility comments: pt able to roll and come to EOB without physical assist, cues for sequencing with precautions    Transfers Overall transfer level: Needs assistance Equipment used: Rolling walker (2 wheels) Transfers: Sit to/from Stand Sit to Stand: Supervision           General transfer comment: supervision for safety, good hand placement, no LOB    Ambulation/Gait Ambulation/Gait assistance: Supervision Gait Distance (Feet): 150 Feet Assistive device: Rolling walker (2 wheels) Gait Pattern/deviations: Step-through pattern, Trendelenburg Gait velocity: decreased Gait velocity interpretation: 1.31 - 2.62 ft/sec, indicative of limited community ambulator   General Gait Details: pt tolerated gait without standing rest, continues to report pain down RLE that he describes as a tight rope. Slightly better than before surgery though  Stairs Stairs: Yes Stairs assistance: Supervision Stair Management: Two rails, Step to pattern, Forwards Number of Stairs: 4 General stair comments: ascends with LLE as R knee needs replacement. Descends with RLE. Safe use of rails. Recommend supervision with stairs as pt has had several falls in past couple of years  Wheelchair Mobility     Tilt Bed    Modified Rankin (Stroke Patients Only)       Balance Overall balance assessment: Needs assistance Sitting-balance support: Feet supported, No upper extremity supported Sitting balance-Leahy Scale: Fair     Standing balance support: Bilateral upper extremity supported Standing balance-Leahy Scale: Poor Standing balance comment: reliance on RW for safety  Pertinent Vitals/Pain Pain Assessment Pain Assessment: Faces Faces Pain Scale: Hurts even more Pain Location: low back Pain Descriptors / Indicators: Operative site guarding Pain Intervention(s): Limited activity within patient's tolerance, Monitored during session, Premedicated before session    Home Living Family/patient expects to be discharged to:: Private residence Living Arrangements: Spouse/significant other;Children Available Help at Discharge: Family;Available PRN/intermittently Type of Home: House Home Access: Stairs to enter Entrance Stairs-Rails: Doctor, general practice of Steps: 3   Home Layout: One level Home Equipment: Agricultural consultant (2 wheels);Cane - single point;Crutches;Shower seat;Adaptive equipment Additional Comments: has help from wife and 2 kids in their 10s. 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    Prior Function Prior Level of Function : Independent/Modified Independent             Mobility Comments: typically uses SPC, has been using RW for mobility in recent weeks due to increased back pain ADLs Comments: Mod I for ADLs, IADLs typically; plays guitat     Extremity/Trunk Assessment   Upper Extremity Assessment Upper Extremity Assessment: Defer to OT evaluation    Lower Extremity Assessment Lower Extremity Assessment: RLE deficits/detail RLE Deficits / Details: Pain rediating down R LE, mildly decreased from before surgery but still with mild trendelenberg gait due to this RLE Sensation: history of peripheral neuropathy RLE Coordination: decreased gross motor    Cervical / Trunk Assessment Cervical / Trunk Assessment: Back Surgery  Communication   Communication Communication: No apparent difficulties    Cognition Arousal: Alert Behavior During Therapy: WFL for tasks assessed/performed   PT - Cognitive impairments: No apparent impairments                         Following commands: Intact        Cueing Cueing Techniques: Verbal cues     General Comments General comments (skin integrity, edema, etc.): discussed activity modifications and home activity level    Exercises Other Exercises Other Exercises: lie flat no pillow x3 mins Other Exercises: pelvic tilt x5 before getting up   Assessment/Plan    PT Assessment All further PT needs can be met in the next venue of care  PT Problem List Decreased strength;Decreased range of motion;Decreased activity tolerance;Decreased balance;Decreased mobility;Pain       PT Treatment Interventions DME instruction;Gait training;Stair training;Functional mobility training;Therapeutic activities;Therapeutic exercise;Balance training;Patient/family education    PT Goals (Current goals can be found in the Care Plan section)  Acute Rehab PT Goals Patient Stated Goal: decreased pain, increased independence PT Goal Formulation: All assessment and education complete, DC therapy    Frequency       Co-evaluation               AM-PAC PT 6 Clicks Mobility  Outcome Measure Help needed turning from your back to your side while in a flat bed without using bedrails?: None Help needed moving from lying on your back to sitting on the side of a flat bed without using bedrails?: None Help needed moving to and from a bed to a chair (including a wheelchair)?: None Help needed standing up from a chair using your arms (e.g., wheelchair or bedside chair)?: A Little Help needed to walk in hospital room?: A Little Help needed climbing 3-5 steps with a railing? : A Little 6 Click Score: 21    End of Session Equipment Utilized During Treatment: Gait belt;Back brace Activity Tolerance: Patient tolerated treatment well Patient left: in bed;with call bell/phone within reach Nurse Communication:  Mobility status PT Visit Diagnosis: Unsteadiness on feet (R26.81);Other abnormalities of gait and mobility (R26.89);Pain Pain - Right/Left: Right Pain -  part of body: Leg (back)    Time: 0935-1006 PT Time Calculation (min) (ACUTE ONLY): 31 min   Charges:   PT Evaluation $PT Eval Moderate Complexity: 1 Mod PT Treatments $Gait Training: 8-22 mins PT General Charges $$ ACUTE PT VISIT: 1 Visit         Richerd Lipoma, PT  Acute Rehab Services Secure chat preferred Office [phone]   Richerd CROME Jackelynn Hosie 02/18/2024, 11:53 AM

## 2024-02-18 NOTE — Progress Notes (Signed)
 Subjective: Patient reports doing well, a lot of back pain but no leg pain   Objective: Vital signs in last 24 hours: Temp:  [98.2 F (36.8 C)-99.6 F (37.6 C)] 98.5 F (36.9 C) (08/05 0354) Pulse Rate:  [71-105] 86 (08/05 0730) Resp:  [11-20] 20 (08/05 0730) BP: (70-163)/(30-136) 118/97 (08/05 0730) SpO2:  [94 %-100 %] 100 % (08/05 0730) Weight:  [140.6 kg] 140.6 kg (08/04 1024)  Intake/Output from previous day: 08/04 0701 - 08/05 0700 In: 3675 [P.O.:300; I.V.:2500; Blood:375; IV Piggyback:500] Out: 2925 [Urine:1100; Drains:625; Blood:1200] Intake/Output this shift: No intake/output data recorded.  Neurologic: Grossly normal  Lab Results: Lab Results  Component Value Date   WBC 19.9 (H) 02/17/2024   HGB 11.4 (L) 02/17/2024   HCT 34.8 (L) 02/17/2024   MCV 94.1 02/17/2024   PLT 157 02/17/2024   Lab Results  Component Value Date   INR 1.2 02/04/2024   BMET Lab Results  Component Value Date   NA 132 (L) 02/10/2024   K 4.3 02/10/2024   CL 102 02/10/2024   CO2 23 02/10/2024   GLUCOSE 127 (H) 02/10/2024   BUN 29 (H) 02/10/2024   CREATININE 0.77 02/10/2024   CALCIUM  8.3 (L) 02/10/2024    Studies/Results: DG Lumbar Spine 2-3 Views Result Date: 02/17/2024 CLINICAL DATA:  Elective surgery. EXAM: LUMBAR SPINE - 2-3 VIEW COMPARISON:  MRI 01/31/2024 FINDINGS: Three fluoroscopic spot views of the lumbar spine submitted from the operating room. Previous L4-L5 fusion. Subsequent pedicle screws at L3 and S1 with L3-L4 and L5-S1 interbody spacers. Fluoroscopy time 71.2 seconds. Dose 126.64 mGy. IMPRESSION: Intraoperative fluoroscopy during lumbar spine surgery. Electronically Signed   By: Andrea Gasman M.D.   On: 02/17/2024 18:45   DG C-Arm 1-60 Min-No Report Result Date: 02/17/2024 Fluoroscopy was utilized by the requesting physician.  No radiographic interpretation.   DG C-Arm 1-60 Min-No Report Result Date: 02/17/2024 Fluoroscopy was utilized by the requesting physician.   No radiographic interpretation.   DG C-Arm 1-60 Min-No Report Result Date: 02/17/2024 Fluoroscopy was utilized by the requesting physician.  No radiographic interpretation.   DG C-Arm 1-60 Min-No Report Result Date: 02/17/2024 Fluoroscopy was utilized by the requesting physician.  No radiographic interpretation.    Assessment/Plan: Postop day 1 two level PLIF, drain putting out a lot so we will see how this does today and make sure it slows down before being discharged.    LOS: 1 day    Suzen Lacks Demetric Parslow 02/18/2024, 7:43 AM

## 2024-02-18 NOTE — Evaluation (Signed)
 Occupational Therapy Evaluation/Discharge Patient Details Name: Domenic D [name] MRN: [id number] DOB: Jun 30, 1956 Today's Date: 02/18/2024   History of Present Illness   Pt is a 68 y/o male admitted for lumbar laminectomy of L3-4, lumbar fusion at L3-4 and L5-S1 and hardware removal at L4-5 in setting of spinal stenosis.PMH includes asthma, allergic rhinitis, chronic back pain, hypertension, hyperlipidemia, Barrett's esophagus, depression, type 2 diabetes, diabetic foot infections/osteomyelitis/toe amputations, OSA on CPAP     Clinical Impressions PTA, pt lives with family, typically Modified Independent with ADLs, IADLs and mobility using cane but has required a RW due to progressive back pain. Educated re: back precautions for ADLs, IADL considerations, LSO brace mgmt and general body mechanics. Pt able to manage ADLs/transfers with RW w/ no physical assist and increased time required due to ongoing pain. Pt endorses adequate assist at home, denies any concerns. No further skilled OT services needed at this time.      If plan is discharge home, recommend the following:   A little help with bathing/dressing/bathroom;Assistance with cooking/housework;Assist for transportation     Functional Status Assessment   Patient has had a recent decline in their functional status and demonstrates the ability to make significant improvements in function in a reasonable and predictable amount of time.     Equipment Recommendations   None recommended by OT     Recommendations for Other Services         Precautions/Restrictions   Precautions Precautions: Back Precaution Booklet Issued: Yes (comment) Recall of Precautions/Restrictions: Intact Required Braces or Orthoses: Spinal Brace Spinal Brace: Lumbar corset Restrictions Weight Bearing Restrictions Per Provider Order: No     Mobility Bed Mobility Overal bed mobility: Needs Assistance Bed Mobility: Rolling, Sit to  Sidelying Rolling: Modified independent (Device/Increase time)       Sit to sidelying: Modified independent (Device/Increase time) General bed mobility comments: EOB on entry, able to return to supine without issues    Transfers Overall transfer level: Needs assistance Equipment used: Rolling walker (2 wheels) Transfers: Sit to/from Stand Sit to Stand: Supervision                  Balance Overall balance assessment: Needs assistance Sitting-balance support: Feet supported, No upper extremity supported Sitting balance-Leahy Scale: Fair     Standing balance support: Bilateral upper extremity supported Standing balance-Leahy Scale: Poor                             ADL either performed or assessed with clinical judgement   ADL Overall ADL's : Needs assistance/impaired Eating/Feeding: Independent   Grooming: Modified independent;Standing   Upper Body Bathing: Modified independent;Sitting   Lower Body Bathing: Supervison/ safety;Sitting/lateral leans;Sit to/from stand   Upper Body Dressing : Modified independent   Lower Body Dressing: Supervision/safety;Sitting/lateral leans;Sit to/from stand Lower Body Dressing Details (indicate cue type and reason): able to don shorts sitting EOB, discussed use of reacher; modifications of clothing worn Toilet Transfer: Supervision/safety;Ambulation;Rolling walker (2 wheels)   Toileting- Clothing Manipulation and Hygiene: Supervision/safety;Sit to/from stand;Sitting/lateral lean       Functional mobility during ADLs: Supervision/safety;Rolling walker (2 wheels)       Vision Baseline Vision/History: 1 Wears glasses Ability to See in Adequate Light: 0 Adequate Patient Visual Report: No change from baseline Vision Assessment?: No apparent visual deficits     Perception         Praxis         Pertinent Vitals/Pain Pain  Assessment Pain Assessment: Faces Faces Pain Scale: Hurts even more Pain Location: low  back Pain Descriptors / Indicators: Operative site guarding Pain Intervention(s): Monitored during session, Limited activity within patient's tolerance     Extremity/Trunk Assessment Upper Extremity Assessment Upper Extremity Assessment: Overall WFL for tasks assessed;Right hand dominant   Lower Extremity Assessment Lower Extremity Assessment: Defer to PT evaluation   Cervical / Trunk Assessment Cervical / Trunk Assessment: Back Surgery   Communication Communication Communication: No apparent difficulties   Cognition Arousal: Alert Behavior During Therapy: WFL for tasks assessed/performed Cognition: No apparent impairments                               Following commands: Intact       Cueing  General Comments   Cueing Techniques: Verbal cues      Exercises     Shoulder Instructions      Home Living Family/patient expects to be discharged to:: Private residence Living Arrangements: Spouse/significant other;Children Available Help at Discharge: Family;Available PRN/intermittently Type of Home: House Home Access: Stairs to enter [employer] of Steps: 3 Entrance Stairs-Rails: Right;Left Home Layout: One level     Bathroom Shower/Tub: Chief Strategy Officer: Standard     Home Equipment: Agricultural consultant (2 wheels);Cane - single point;Crutches;Shower seat;Adaptive equipment Adaptive Equipment: Reacher Additional Comments: reports oblong toilet seat that does not impact lower back nerves when sitting on toilet      Prior Functioning/Environment Prior Level of Function : Independent/Modified Independent             Mobility Comments: typically uses SPC, has been using RW for mobility in recent weeks due to increased back pain ADLs Comments: Mod I for ADLs, IADLs typically; plays guitat    OT Problem List:     OT Treatment/Interventions:        OT Goals(Current goals can be found in the care plan section)   Acute  Rehab OT Goals Patient Stated Goal: pain control, no more back surgeries OT Goal Formulation: With patient Time For Goal Achievement: 03/03/24 Potential to Achieve Goals: Good   OT Frequency:       Co-evaluation              AM-PAC OT 6 Clicks Daily Activity     Outcome Measure Help from another person eating meals?: None Help from another person taking care of personal grooming?: None Help from another person toileting, which includes using toliet, bedpan, or urinal?: A Little Help from another person bathing (including washing, rinsing, drying)?: A Little Help from another person to put on and taking off regular upper body clothing?: None Help from another person to put on and taking off regular lower body clothing?: A Little 6 Click Score: 21   End of Session Equipment Utilized During Treatment: Rolling walker (2 wheels);Back brace Nurse Communication: Mobility status;Patient requests pain meds  Activity Tolerance: Patient tolerated treatment well Patient left: in bed;with call bell/phone within reach  OT Visit Diagnosis: Pain Pain - part of body:  (back)                Time: 9273-9244 OT Time Calculation (min): 29 min Charges:  OT General Charges $OT Visit: 1 Visit OT Evaluation $OT Eval Low Complexity: 1 Low OT Treatments $Self Care/Home Management : 8-22 mins  Mliss NOVAK, OTR/L Acute Rehab Services Office: [phone]   Mliss Fish 02/18/2024, 8:08 AM

## 2024-02-18 NOTE — Progress Notes (Signed)
 Orthopedic Tech Progress Note Patient Details:  Robert [name] 06-23-1956 [id number]  Ortho Devices Type of Ortho Device: Lumbar corsett Ortho Device/Splint Interventions: Ordered, Application, Adjustment   Post Interventions Patient Tolerated: Well Instructions Provided: Care of device, Adjustment of device  [name] 02/18/2024, 2:39 AM

## 2024-02-18 NOTE — Plan of Care (Signed)

## 2024-02-19 LAB — GLUCOSE, CAPILLARY: Glucose-Capillary: 154 mg/dL — ABNORMAL HIGH (ref 70–99)

## 2024-02-19 MED ORDER — OXYCODONE HCL 5 MG PO TABS: 15.0000 mg | ORAL_TABLET | ORAL | 0 refills | Status: AC | PRN

## 2024-02-19 MED ORDER — CYCLOBENZAPRINE HCL 10 MG PO TABS: 5.0000 mg | ORAL_TABLET | Freq: Three times a day (TID) | ORAL | 0 refills | Status: AC | PRN

## 2024-02-19 NOTE — Progress Notes (Signed)
 Patient alert and oriented, voiding adequately, skin clean, dry and intact without evidence of skin break down, or symptoms of complications - no redness or edema noted, only slight tenderness at site.  Patient states pain is manageable at time of discharge. Patient has an appointment with MD in 2 weeks

## 2024-02-19 NOTE — Discharge Summary (Signed)
 Physician Discharge Summary  Patient ID: Robert [name] MRN: [id number] DOB/AGE: [birth year] 68 y.o.  Admit date: 02/17/2024 Discharge date: 02/19/2024  Admission Diagnoses: Lumbar spinal stenosis L3-4 herniated nucleus pulposus lumbar spinal stenosis L5-S1.     Discharge Diagnoses: same   Discharged Condition: good  Hospital Course: The patient was admitted on 02/17/2024 and taken to the operating room where the patient underwent PLIF L3-4, L5-S1. The patient tolerated the procedure well and was taken to the recovery room and then to the floor in stable condition. The hospital course was routine. There were no complications. The wound remained clean dry and intact. Pt had appropriate back soreness. No complaints of leg pain or new N/T/W. The patient remained afebrile with stable vital signs, and tolerated a regular diet. The patient continued to increase activities, and pain was well controlled with oral pain medications.   Consults: None  Significant Diagnostic Studies:  Results for orders placed or performed during the hospital encounter of 02/17/24  Glucose, capillary   Collection Time: 02/17/24 10:28 AM  Result Value Ref Range   Glucose-Capillary 100 (H) 70 - 99 mg/dL  Type and screen [facility]   Collection Time: 02/17/24 11:00 AM  Result Value Ref Range   ABO/RH(D) O POS    Antibody Screen NEG    Sample Expiration      02/20/2024,2359 Performed at [facility], [address] [place], [place] [address]   Glucose, capillary   Collection Time: 02/17/24 12:20 PM  Result Value Ref Range   Glucose-Capillary 119 (H) 70 - 99 mg/dL  Glucose, capillary   Collection Time: 02/17/24  7:50 PM  Result Value Ref Range   Glucose-Capillary 157 (H) 70 - 99 mg/dL  CBC   Collection Time: 02/17/24 10:52 PM  Result Value Ref Range   WBC 19.9 (H) 4.0 - 10.5 K/uL   RBC 3.70 (L) 4.22 - 5.81 MIL/uL   Hemoglobin 11.4 (L) 13.0 - 17.0 g/dL   HCT 65.1 (L) 60.9 - 47.9 %    MCV 94.1 80.0 - 100.0 fL   MCH 30.8 26.0 - 34.0 pg   MCHC 32.8 30.0 - 36.0 g/dL   RDW 85.9 88.4 - 84.4 %   Platelets 157 150 - 400 K/uL   nRBC 0.0 0.0 - 0.2 %  Glucose, capillary   Collection Time: 02/18/24 12:15 AM  Result Value Ref Range   Glucose-Capillary 137 (H) 70 - 99 mg/dL   Comment 1 Notify RN    Comment 2 Document in Chart   Glucose, capillary   Collection Time: 02/18/24  6:41 AM  Result Value Ref Range   Glucose-Capillary 136 (H) 70 - 99 mg/dL   Comment 1 Notify RN    Comment 2 Document in Chart   CBC with Differential/Platelet   Collection Time: 02/18/24  8:37 AM  Result Value Ref Range   WBC 12.2 (H) 4.0 - 10.5 K/uL   RBC 3.17 (L) 4.22 - 5.81 MIL/uL   Hemoglobin 9.8 (L) 13.0 - 17.0 g/dL   HCT 71.0 (L) 60.9 - 47.9 %   MCV 91.2 80.0 - 100.0 fL   MCH 30.9 26.0 - 34.0 pg   MCHC 33.9 30.0 - 36.0 g/dL   RDW 86.3 88.4 - 84.4 %   Platelets 112 (L) 150 - 400 K/uL   nRBC 0.0 0.0 - 0.2 %   Neutrophils Relative % 79 %   Neutro Abs 9.6 (H) 1.7 - 7.7 K/uL   Lymphocytes Relative 9 %  Lymphs Abs 1.1 0.7 - 4.0 K/uL   Monocytes Relative 11 %   Monocytes Absolute 1.3 (H) 0.1 - 1.0 K/uL   Eosinophils Relative 0 %   Eosinophils Absolute 0.0 0.0 - 0.5 K/uL   Basophils Relative 0 %   Basophils Absolute 0.0 0.0 - 0.1 K/uL   Immature Granulocytes 1 %   Abs Immature Granulocytes 0.09 (H) 0.00 - 0.07 K/uL  Basic metabolic panel   Collection Time: 02/18/24  8:37 AM  Result Value Ref Range   Sodium 135 135 - 145 mmol/L   Potassium 4.4 3.5 - 5.1 mmol/L   Chloride 103 98 - 111 mmol/L   CO2 24 22 - 32 mmol/L   Glucose, Bld 141 (H) 70 - 99 mg/dL   BUN 22 8 - 23 mg/dL   Creatinine, Ser 8.89 0.61 - 1.24 mg/dL   Calcium  8.1 (L) 8.9 - 10.3 mg/dL   GFR, Estimated >39 >39 mL/min   Anion gap 8 5 - 15  Glucose, capillary   Collection Time: 02/18/24 12:18 PM  Result Value Ref Range   Glucose-Capillary 133 (H) 70 - 99 mg/dL  Glucose, capillary   Collection Time: 02/18/24  5:44 PM   Result Value Ref Range   Glucose-Capillary 143 (H) 70 - 99 mg/dL   Comment 1 Notify RN    Comment 2 Document in Chart   Glucose, capillary   Collection Time: 02/18/24  9:11 PM  Result Value Ref Range   Glucose-Capillary 149 (H) 70 - 99 mg/dL   Comment 1 Notify RN    Comment 2 Document in Chart   Glucose, capillary   Collection Time: 02/19/24  6:21 AM  Result Value Ref Range   Glucose-Capillary 154 (H) 70 - 99 mg/dL   Comment 1 Notify RN    Comment 2 Document in Chart     DG Lumbar Spine 2-3 Views Result Date: 02/17/2024 CLINICAL DATA:  Elective surgery. EXAM: LUMBAR SPINE - 2-3 VIEW COMPARISON:  MRI 01/31/2024 FINDINGS: Three fluoroscopic spot views of the lumbar spine submitted from the operating room. Previous L4-L5 fusion. Subsequent pedicle screws at L3 and S1 with L3-L4 and L5-S1 interbody spacers. Fluoroscopy time 71.2 seconds. Dose 126.64 mGy. IMPRESSION: Intraoperative fluoroscopy during lumbar spine surgery. Electronically Signed   By: Andrea Gasman M.D.   On: 02/17/2024 18:45   DG C-Arm 1-60 Min-No Report Result Date: 02/17/2024 Fluoroscopy was utilized by the requesting physician.  No radiographic interpretation.   DG C-Arm 1-60 Min-No Report Result Date: 02/17/2024 Fluoroscopy was utilized by the requesting physician.  No radiographic interpretation.   DG C-Arm 1-60 Min-No Report Result Date: 02/17/2024 Fluoroscopy was utilized by the requesting physician.  No radiographic interpretation.   DG C-Arm 1-60 Min-No Report Result Date: 02/17/2024 Fluoroscopy was utilized by the requesting physician.  No radiographic interpretation.   IR INJECT DIAG/THERA/INC NEEDLE/CATH/PLC EPI/LUMB/SAC W/IMG Result Date: 02/04/2024 CLINICAL DATA:  Previous lumbar fusion none L4-5. Severe right lower extremity radicular pain. EXAM: SELECTIVE NERVE ROOT BLOCK AND TRANSFORAMINAL EPIDURAL STEROID INJECTION UNDER FLUOROSCOPY FLUOROSCOPY: Radiation Exposure Index (as provided by the fluoroscopic  device): 194.1 mGy air Kerma TECHNIQUE: An appropriate skin entry site was determined under fluoroscopy. Operator donned sterile gloves and mask. Site was marked, prepped with Betadine, draped in usual sterile fashion, infiltrated locally with 1% lidocaine . A 22 gauge 15 cm Chiba needle was advanced to the superior ventral margin of the right L5 - S1 neural foramen. Diagnostic injection of 2 ml Omnipaque  180 showed partial outlining of  the exiting nerve root as well as epidural extension of contrast, with no intravascular or subarachnoid component. 80 mg Depo-Medrol  in 3 ml lidocaine  1% was administered. The patient tolerated procedure well, with no immediate complication. IMPRESSION: 1. Technically successful right L5 selective nerve root block and transforaminal epidural steroid injection Electronically Signed   By: JONETTA Faes M.D.   On: 02/04/2024 16:29    Antibiotics:  Anti-infectives (From admission, onward)    Start     Dose/Rate Route Frequency Ordered Stop   02/18/24 1630  cephALEXin  (KEFLEX ) capsule 500 mg        500 mg Oral Every 8 hours 02/18/24 1544     02/18/24 1000  valACYclovir  (VALTREX ) tablet 1,000 mg        1,000 mg Oral Daily 02/17/24 2329     02/18/24 0015  ceFAZolin  (ANCEF ) IVPB 2g/100 mL premix        2 g 200 mL/hr over 30 Minutes Intravenous Every 8 hours 02/17/24 2330 02/18/24 0800       Discharge Exam: Blood pressure (!) 140/86, pulse 93, temperature 99.4 F (37.4 C), temperature source Oral, resp. rate 18, height 5' 11 (1.803 m), weight (!) 140.6 kg, SpO2 99%. Neurologic: Grossly normal Ambulating and voiding well incision cdi   Discharge Medications:   Allergies as of 02/19/2024       Reactions   Onion Other (See Comments)   Stomach Irritation    Latex Rash   With prolonged contact        Medication List     PAUSE taking these medications    amLODipine -olmesartan  5-40 MG tablet Wait to take this until your doctor or other care provider tells you to  start again. Commonly known as: AZOR  Take 1 tablet by mouth daily.       STOP taking these medications    meloxicam  15 MG tablet Commonly known as: MOBIC        TAKE these medications    APPLE CIDER VINEGAR PO Take 15 mLs by mouth 2 (two) times daily.   ascorbic acid  500 MG tablet Commonly known as: VITAMIN C  Take 500 mg by mouth daily.   B COMPLEX 100 PO Take 1 capsule by mouth daily.   blood glucose meter kit and supplies Dispense based on patient and insurance preference. Use to check glucose fasting in the AM and then after largest meal of the day.. (FOR ICD-10 E10.9, E11.9).   chlorhexidine  4 % external liquid Commonly known as: HIBICLENS  Apply 15 mLs (1 Application total) topically as directed for 30 doses. Use as directed daily for 5 days every other week for 6 weeks.   cyclobenzaprine  10 MG tablet Commonly known as: FLEXERIL  Take 0.5-1 tablets (5-10 mg total) by mouth 3 (three) times daily as needed for muscle spasms.   Farxiga  10 MG Tabs tablet Generic drug: dapagliflozin  propanediol Take 10 mg by mouth daily.   furosemide  40 MG tablet Commonly known as: LASIX  Take 20-40 mg by mouth daily as needed for fluid or edema.   gabapentin  300 MG capsule Commonly known as: Neurontin  Take 1 capsule (300 mg total) by mouth 3 (three) times daily.   GINSENG PO Take 1 tablet by mouth 2 (two) times a week.   glucose blood test strip Commonly known as: ONE TOUCH ULTRA TEST Use to test blood sugar, test in the morning fasting and test after largest meal of the day.   magnesium  oxide 400 (240 Mg) MG tablet Commonly known as: MAG-OX Take 400  mg by mouth every evening.   methocarbamol  750 MG tablet Commonly known as: ROBAXIN  Take 1 tablet (750 mg total) by mouth 3 (three) times daily.   Mounjaro  15 MG/0.5ML Pen Generic drug: tirzepatide  Inject 15 mg into the skin once a week.   mupirocin  ointment 2 % Commonly known as: BACTROBAN  Place 1 Application into the  nose 2 (two) times daily for 60 doses. Use as directed 2 times daily for 5 days every other week for 6 weeks.   Narcan  4 MG/0.1ML Liqd nasal spray kit Generic drug: naloxone  Place 1 spray into the nose daily as needed (overdose).   ONE TOUCH ULTRA MINI w/Device Kit Use to test blood sugar, test in the morning fasting and test after largest meal of the day.   OneTouch Delica Lancets 33G Misc Use for testing blood sugar, test once in the morning fasting and test after largest meal of the day.   oxyCODONE  5 MG immediate release tablet Commonly known as: Oxy IR/ROXICODONE  Take 3 tablets (15 mg total) by mouth every 4 (four) hours as needed for severe pain (pain score 7-10) or moderate pain (pain score 4-6) (5mg  for moderate pain and 10 mg for severe). What changed: how much to take   Potassium 99 MG Tabs Take 2 tablets by mouth in the morning and at bedtime.   rosuvastatin  5 MG tablet Commonly known as: CRESTOR  Take 1 tablet (5 mg total) by mouth daily.   traMADol  50 MG tablet Commonly known as: ULTRAM  Take 100 mg by mouth 4 (four) times daily.   valACYclovir  1000 MG tablet Commonly known as: VALTREX  TAKE 1 TABLET BY MOUTH DAILY AS NEEDED (FOR OUTBREAKS ONLY).   Vitamin A  3 MG (10000 UT) Tabs Take 1 tablet by mouth daily.   Vitamin D3 125 MCG (5000 UT) Caps Take 5,000 Units by mouth daily.   VITAMIN E PO Take 1 tablet by mouth daily.        Disposition: home   Final Dx: PLIF L3-4, L5-S1  Discharge Instructions      Remove dressing in 72 hours   Complete by: As directed    Call MD for:   Complete by: As directed    Call MD for:  difficulty breathing, headache or visual disturbances   Complete by: As directed    Call MD for:  hives   Complete by: As directed    Call MD for:  persistant nausea and vomiting   Complete by: As directed    Call MD for:  redness, tenderness, or signs of infection (pain, swelling, redness, odor or green/yellow discharge around incision  site)   Complete by: As directed    Call MD for:  severe uncontrolled pain   Complete by: As directed    Call MD for:  temperature >100.4   Complete by: As directed    Diet - low sodium heart healthy   Complete by: As directed    Driving Restrictions   Complete by: As directed    No driving for 2 weeks, no riding in the car for 1 week   Increase activity slowly   Complete by: As directed    Lifting restrictions   Complete by: As directed    No lifting more than 8 lbs          Signed: Suzen Lacks Kalyan Barabas 02/19/2024, 9:10 AM

## 2024-02-19 NOTE — Plan of Care (Signed)
  Problem: Education: Goal: Knowledge of General Education information will improve Description: Including pain rating scale, medication(s)/side effects and non-pharmacologic comfort measures Outcome: Completed/Met   Problem: Health Behavior/Discharge Planning: Goal: Ability to manage health-related needs will improve Outcome: Completed/Met   Problem: Clinical Measurements: Goal: Ability to maintain clinical measurements within normal limits will improve Outcome: Completed/Met Goal: Will remain free from infection Outcome: Completed/Met Goal: Diagnostic test results will improve Outcome: Completed/Met Goal: Respiratory complications will improve Outcome: Completed/Met Goal: Cardiovascular complication will be avoided Outcome: Completed/Met   Problem: Activity: Goal: Risk for activity intolerance will decrease Outcome: Completed/Met   Problem: Nutrition: Goal: Adequate nutrition will be maintained Outcome: Completed/Met   Problem: Coping: Goal: Level of anxiety will decrease Outcome: Completed/Met   Problem: Elimination: Goal: Will not experience complications related to bowel motility Outcome: Completed/Met Goal: Will not experience complications related to urinary retention Outcome: Completed/Met   Problem: Pain Managment: Goal: General experience of comfort will improve and/or be controlled Outcome: Completed/Met   Problem: Safety: Goal: Ability to remain free from injury will improve Outcome: Completed/Met   Problem: Skin Integrity: Goal: Risk for impaired skin integrity will decrease Outcome: Completed/Met   Problem: Education: Goal: Ability to describe self-care measures that may prevent or decrease complications (Diabetes Survival Skills Education) will improve Outcome: Completed/Met Goal: Individualized Educational Video(s) Outcome: Completed/Met   Problem: Coping: Goal: Ability to adjust to condition or change in health will improve Outcome:  Completed/Met   Problem: Fluid Volume: Goal: Ability to maintain a balanced intake and output will improve Outcome: Completed/Met   Problem: Health Behavior/Discharge Planning: Goal: Ability to identify and utilize available resources and services will improve Outcome: Completed/Met Goal: Ability to manage health-related needs will improve Outcome: Completed/Met   Problem: Metabolic: Goal: Ability to maintain appropriate glucose levels will improve Outcome: Completed/Met   Problem: Nutritional: Goal: Maintenance of adequate nutrition will improve Outcome: Completed/Met Goal: Progress toward achieving an optimal weight will improve Outcome: Completed/Met   Problem: Skin Integrity: Goal: Risk for impaired skin integrity will decrease Outcome: Completed/Met   Problem: Tissue Perfusion: Goal: Adequacy of tissue perfusion will improve Outcome: Completed/Met   Problem: Education: Goal: Ability to verbalize activity precautions or restrictions will improve Outcome: Completed/Met Goal: Knowledge of the prescribed therapeutic regimen will improve Outcome: Completed/Met Goal: Understanding of discharge needs will improve Outcome: Completed/Met   Problem: Activity: Goal: Ability to avoid complications of mobility impairment will improve Outcome: Completed/Met Goal: Ability to tolerate increased activity will improve Outcome: Completed/Met Goal: Will remain free from falls Outcome: Completed/Met   Problem: Bowel/Gastric: Goal: Gastrointestinal status for postoperative course will improve Outcome: Completed/Met   Problem: Clinical Measurements: Goal: Ability to maintain clinical measurements within normal limits will improve Outcome: Completed/Met Goal: Postoperative complications will be avoided or minimized Outcome: Completed/Met Goal: Diagnostic test results will improve Outcome: Completed/Met   Problem: Pain Management: Goal: Pain level will decrease Outcome:  Completed/Met   Problem: Skin Integrity: Goal: Will show signs of wound healing Outcome: Completed/Met   Problem: Health Behavior/Discharge Planning: Goal: Identification of resources available to assist in meeting health care needs will improve Outcome: Completed/Met   Problem: Bladder/Genitourinary: Goal: Urinary functional status for postoperative course will improve Outcome: Completed/Met

## 2024-03-12 ENCOUNTER — Ambulatory Visit: Admitting: Podiatry

## 2024-03-25 LAB — LAB REPORT - SCANNED
A1c: 5.7
EGFR: 104
TSH: 0.756

## 2024-04-02 ENCOUNTER — Ambulatory Visit: Admitting: Podiatry

## 2024-04-16 ENCOUNTER — Other Ambulatory Visit: Payer: Self-pay

## 2024-04-16 ENCOUNTER — Encounter: Payer: Self-pay | Admitting: Internal Medicine

## 2024-04-16 ENCOUNTER — Ambulatory Visit: Admitting: Internal Medicine

## 2024-04-16 VITALS — BP 128/76 | HR 88 | Temp 98.6°F | Wt 334.0 lb

## 2024-04-16 DIAGNOSIS — R509 Fever, unspecified: Secondary | ICD-10-CM | POA: Diagnosis not present

## 2024-04-16 NOTE — Progress Notes (Signed)
 Patient Active Problem List   Diagnosis Date Noted   Spinal stenosis of lumbar region 02/17/2024   Low back pain 02/02/2024   Type 2 diabetes mellitus with diabetic peripheral angiopathy without gangrene, without long-term current use of insulin  (HCC) 04/11/2023   Acute osteomyelitis of toe, right (HCC) 04/10/2023   Secondary hypertension 04/10/2023   Type 2 diabetes mellitus (HCC) 06/02/2022   Conjunctivitis due to adenovirus, left eye 09/11/2018   Type 2 diabetes mellitus with foot ulcer (CODE) (HCC) 08/21/2018   Amputation of second toe, right, traumatic, sequela 08/21/2018   Osteomyelitis of fourth toe of left foot (HCC)    Lower extremity cellulitis 06/08/2018   Hepatitis C antibody test positive 04/16/2018   Osteomyelitis of toe of right foot (HCC) 04/10/2018   Hyperlipidemia 04/08/2018   Recurrent cellulitis of lower extremity 04/07/2018   Chronic neuropathy of both feet-  due to lumbar radiculopathy as well as diabetic neuropathy 03/25/2018   Pancytopenia (HCC) 03/15/2018   DJD (degenerative joint disease) 01/09/2018   Diabetic foot infection (HCC) 09/30/2017   Essential hypertension 09/30/2017   Mixed diabetic hyperlipidemia associated with type 2 diabetes mellitus (HCC) 09/30/2017   Barrett's esophagus 12/07/2016   Chronic venous stasis dermatitis of both lower extremities 05/08/2016   Vitamin D  deficiency 05/08/2016   OSA (obstructive sleep apnea) 04/11/2016   Bilateral lower extremity pitting edema 04/11/2016   Physical deconditioning 04/11/2016   Restrictive lung disease secondary to obesity 04/11/2016   History of tobacco use 04/11/2016   h/o Depressive disorder 04/11/2016   Lumbar radiculopathy 07/21/2012   Chronic pain due to injury 07/21/2012   Morbid obesity (HCC) 02/15/2012   Postlaminectomy syndrome, lumbar region 01/04/2012   h/o Narcotic abuse 01/04/2012   Constipation 09/22/2011   Chronic back pain 09/22/2011    Patient's Medications   New Prescriptions   No medications on file  Previous Medications   AMLODIPINE -OLMESARTAN  (AZOR ) 5-40 MG TABLET    Take 1 tablet by mouth daily.   APPLE CIDER VINEGAR PO    Take 15 mLs by mouth 2 (two) times daily.    B COMPLEX VITAMINS (B COMPLEX 100 PO)    Take 1 capsule by mouth daily.   BLOOD GLUCOSE METER KIT AND SUPPLIES    Dispense based on patient and insurance preference. Use to check glucose fasting in the AM and then after largest meal of the day.. (FOR ICD-10 E10.9, E11.9).   BLOOD GLUCOSE MONITORING SUPPL (ONE TOUCH ULTRA MINI) W/DEVICE KIT    Use to test blood sugar, test in the morning fasting and test after largest meal of the day.   CHLORHEXIDINE  (HIBICLENS ) 4 % EXTERNAL LIQUID    Apply 15 mLs (1 Application total) topically as directed for 30 doses. Use as directed daily for 5 days every other week for 6 weeks.   CHOLECALCIFEROL  (VITAMIN D3) 125 MCG (5000 UT) CAPS    Take 5,000 Units by mouth daily.   CYCLOBENZAPRINE  (FLEXERIL ) 10 MG TABLET    Take 0.5-1 tablets (5-10 mg total) by mouth 3 (three) times daily as needed for muscle spasms.   FARXIGA  10 MG TABS TABLET    Take 10 mg by mouth daily.   FUROSEMIDE  (LASIX ) 40 MG TABLET    Take 20-40 mg by mouth daily as needed for fluid or edema.   GABAPENTIN  (NEURONTIN ) 300 MG CAPSULE    Take 1 capsule (300 mg total) by mouth 3 (three) times daily.   GINSENG PO  Take 1 tablet by mouth 2 (two) times a week.   GLUCOSE BLOOD (ONE TOUCH ULTRA TEST) TEST STRIP    Use to test blood sugar, test in the morning fasting and test after largest meal of the day.   MAGNESIUM  (RA NATURAL MAGNESIUM ) 250 MG TABS    Daily 2   MAGNESIUM  OXIDE (MAG-OX) 400 (240 MG) MG TABLET    Take 400 mg by mouth every evening.   METHOCARBAMOL  (ROBAXIN ) 750 MG TABLET    Take 1 tablet (750 mg total) by mouth 3 (three) times daily.   NARCAN  4 MG/0.1ML LIQD NASAL SPRAY KIT    Place 1 spray into the nose daily as needed (overdose).   ONETOUCH DELICA LANCETS 33G MISC     Use for testing blood sugar, test once in the morning fasting and test after largest meal of the day.   OXYCODONE  (OXY IR/ROXICODONE ) 5 MG IMMEDIATE RELEASE TABLET    Take 3 tablets (15 mg total) by mouth every 4 (four) hours as needed for severe pain (pain score 7-10) or moderate pain (pain score 4-6) (5mg  for moderate pain and 10 mg for severe).   OZEMPIC , 0.25 OR 0.5 MG/DOSE, 2 MG/3ML SOPN    SMARTSIG:0.5 Milligram(s) Once a Week   POTASSIUM 99 MG TABS    Take 2 tablets by mouth in the morning and at bedtime.   ROSUVASTATIN  (CRESTOR ) 5 MG TABLET    Take 1 tablet (5 mg total) by mouth daily.   TIRZEPATIDE  (MOUNJARO ) 15 MG/0.5ML PEN    Inject 15 mg into the skin once a week.   TRAMADOL  (ULTRAM ) 50 MG TABLET    Take 100 mg by mouth 4 (four) times daily.   VALACYCLOVIR  (VALTREX ) 1000 MG TABLET    TAKE 1 TABLET BY MOUTH DAILY AS NEEDED (FOR OUTBREAKS ONLY).   VITAMIN A  3 MG (10000 UT) TABS    Take 1 tablet by mouth daily.   VITAMIN C  (ASCORBIC ACID ) 500 MG TABLET    Take 500 mg by mouth daily.   VITAMIN E PO    Take 1 tablet by mouth daily.  Modified Medications   No medications on file  Discontinued Medications   No medications on file    Subjective: 68 year old male with history of lumabar stenosis with hernaition sp PLIF on on 02/17/24, asthma, back pain, Barrett's esophagus, diabetes type 2, narcotic abuse with pain meds presents for referral for a fever of unknown origin.   He was seen by family practice and referred to infectious disease given fever noted at that point on 9/17 been going on for about 6 months.  Noted happens monthly.  Recurrent temps of 101-102.  Today 04/16/24 : Discussed the use of AI scribe software for clinical note transcription with the patient, who gave verbal consent to proceed. He has been experiencing periodic fevers for the past year, occurring approximately once a month. These fevers typically last for three to four days and reach temperatures of 101-102F. The  fevers often start suddenly without any specific triggers. During these episodes, he experiences significant fatigue, feeling 'hot and then cold' and often needing to sleep extensively.  In conjunction with the fevers, he experiences joint swelling, particularly in the distal joints of his fingers and occasionally in his knees. The joint swelling is painful and occurs concurrently with the fever episodes. No associated headaches or abdominal pain during these episodes.  His past medical history includes significant childhood illnesses, including multiple hospitalizations for pneumonia and a history  of hepatitis A at age 26. He also has a history of back pain and surgery, as well as recurrent cellulitis, primarily affecting his legs. The cellulitis episodes are characterized by a rash and have been a frequent cause of hospitalization.  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 He denies any recent travel outside the country, with his last international travel being over 15 years ago. He has a history of smoking marijuana but denies any other drug use or current alcohol consumption.  He is not aware of any family history of autoimmune diseases, although his mother has irritable bowel syndrome (IBS). He is currently undergoing evaluation for gastrointestinal issues.  Review of Systems: Review of Systems  All other systems reviewed and are negative.   Past Medical History:  Diagnosis Date   Allergic rhinitis due to pollen    Arthritis    Asthma    Back pain    Barrett esophagus    Depressive disorder    Diabetes mellitus type 2 in obese    Exposure to hepatitis B    Exposure to hepatitis C    Narcotic abuse (HCC)    pain medications   OSA (obstructive sleep apnea)    on CPAP     Social History   Tobacco Use   Smoking status: Former    Current packs/day: 0.00    Average packs/day:  0.8 packs/day for 15.0 years (12.0 ttl pk-yrs)    Types: Cigarettes    Start date: 07/16/1989    Quit date: 07/16/2004    Years since quitting: 19.7   Smokeless tobacco: Never  Vaping Use   Vaping status: Never Used  Substance Use Topics   Alcohol use: No   Drug use: No    Comment: Prior abuse of narcotics    Family History  Problem Relation Age of Onset   Allergies Mother    Early death Father    Thyroid  disease Sister    Colon cancer Neg Hx    Stomach cancer Neg Hx     Allergies  Allergen Reactions   Onion Other (See Comments)    Stomach Irritation    Latex Rash    With prolonged contact    Health Maintenance  Topic Date Due   OPHTHALMOLOGY EXAM  Never done   Pneumococcal Vaccine: 50+ Years (1 of 2 - PCV) Never done   Zoster Vaccines- Shingrix (1 of 2) Never done   COVID-19 Vaccine (2 - Pfizer risk series) 12/21/2019   Diabetic kidney evaluation - Urine ACR  06/03/2021   FOOT EXAM  06/03/2021   Medicare Annual Wellness (AWV)  07/26/2022   Influenza Vaccine  02/14/2024   HEMOGLOBIN A1C  08/05/2024   Diabetic kidney evaluation - eGFR measurement  02/17/2025   Colonoscopy  12/08/2026   DTaP/Tdap/Td (2 - Td or Tdap) 05/10/2027   Hepatitis C Screening  Completed   HPV VACCINES  Aged Out   Meningococcal B Vaccine  Aged Out    Objective:  Vitals:   04/16/24 1000  BP: 128/76  Pulse: 88  Temp: 98.6 F (37 C)  TempSrc: Oral  SpO2: 97%  Weight: (!) 334 lb (151.5 kg)   Body mass index is 46.58 kg/m.  Physical Exam Constitutional:      General: He is not in acute distress.    Appearance: He is normal weight. He is not toxic-appearing.  HENT:  Head: Normocephalic and atraumatic.     Right Ear: External ear normal.     Left Ear: External ear normal.     Nose: No congestion or rhinorrhea.     Mouth/Throat:     Mouth: Mucous membranes are moist.     Pharynx: Oropharynx is clear.  Eyes:     Extraocular Movements: Extraocular movements intact.      Conjunctiva/sclera: Conjunctivae normal.     Pupils: Pupils are equal, round, and reactive to light.  Cardiovascular:     Rate and Rhythm: Normal rate and regular rhythm.     Heart sounds: No murmur heard.    No friction rub. No gallop.  Pulmonary:     Effort: Pulmonary effort is normal.     Breath sounds: Normal breath sounds.  Abdominal:     General: Abdomen is flat. Bowel sounds are normal.     Palpations: Abdomen is soft.  Musculoskeletal:        General: No swelling.     Cervical back: Normal range of motion and neck supple.     Comments: Right thumb with ROM somewhat limited  Skin:    General: Skin is warm and dry.  Neurological:     General: No focal deficit present.     Mental Status: He is oriented to person, place, and time.  Psychiatric:        Mood and Affect: Mood normal.    Lab Results Lab Results  Component Value Date   WBC 12.2 (H) 02/18/2024   HGB 9.8 (L) 02/18/2024   HCT 28.9 (L) 02/18/2024   MCV 91.2 02/18/2024   PLT 112 (L) 02/18/2024    Lab Results  Component Value Date   CREATININE 1.10 02/18/2024   BUN 22 02/18/2024   NA 135 02/18/2024   K 4.4 02/18/2024   CL 103 02/18/2024   CO2 24 02/18/2024    Lab Results  Component Value Date   ALT 19 02/04/2024   AST 16 02/04/2024   ALKPHOS 55 02/04/2024   BILITOT 0.8 02/04/2024    Lab Results  Component Value Date   CHOL 161 03/28/2021   HDL 29 (L) 03/28/2021   LDLCALC 101 (H) 03/28/2021   TRIG 175 (H) 03/28/2021   CHOLHDL 5.6 (H) 03/28/2021   No results found for: LABRPR, RPRTITER No results found for: HIV1RNAQUANT, HIV1RNAVL, CD4TABS   Problem List Items Addressed This Visit   None  Results   Assessment/Plan Recurrent fevers with episodic joint swelling and rash Recurrent fevers with joint swelling and rash suggest autoimmune etiology. Rather than infectious.. No active hepatitis indicated by normal liver enzymes. Reviewed labs on 9/9 WBC 4K, SCR 0.6 LFTs within normal  limits, LDL 60, hemoglobin A1c 5.7, TSH normal at 0.756. sees ortho, today exan of hands were not suspicious for acute infection. 2018 xray right hand showed sever DJD of first cmc joint Toe ulcer, appears to be healing  Plan: - Order blood cultures to rule out infection. - Conduct autoimmune workup. - Perform hepatitis workup to confirm no active hepatitis. - Order HIV and syphilis tests. -F/U on 10//16, sees ortho in the interim. Consider imaging     Loney Stank, MD [facility] 04/16/2024, 10:06 AM   I have personally spent 62 minutes involved in face-to-face and non-face-to-face activities for this patient on the day of the visit. Professional time spent includes the following activities: Preparing to see the patient (review of tests), Obtaining and/or reviewing  separately obtained history (admission/discharge record), Performing a medically appropriate examination and/or evaluation , Ordering medications/tests/procedures, referring and communicating with other health care professionals, Documenting clinical information in the EMR, Independently interpreting results (not separately reported), Communicating results to the patient/family/caregiver, Counseling and educating the patient/family/caregiver and Care coordination (not separately reported).

## 2024-04-22 ENCOUNTER — Encounter: Payer: Self-pay | Admitting: Nurse Practitioner

## 2024-04-22 LAB — CULTURE, BLOOD (SINGLE)
MICRO NUMBER:: 17052630
Result:: NO GROWTH
SPECIMEN QUALITY:: ADEQUATE

## 2024-04-30 ENCOUNTER — Ambulatory Visit: Admitting: Internal Medicine

## 2024-04-30 ENCOUNTER — Encounter: Payer: Self-pay | Admitting: Physical Therapy

## 2024-04-30 ENCOUNTER — Ambulatory Visit: Attending: Family Medicine | Admitting: Physical Therapy

## 2024-04-30 ENCOUNTER — Encounter: Payer: Self-pay | Admitting: Internal Medicine

## 2024-04-30 ENCOUNTER — Other Ambulatory Visit: Payer: Self-pay

## 2024-04-30 VITALS — BP 145/3 | HR 90 | Temp 98.4°F | Wt 327.0 lb

## 2024-04-30 DIAGNOSIS — M5459 Other low back pain: Secondary | ICD-10-CM | POA: Diagnosis present

## 2024-04-30 DIAGNOSIS — R5081 Fever presenting with conditions classified elsewhere: Secondary | ICD-10-CM | POA: Diagnosis not present

## 2024-04-30 DIAGNOSIS — R2689 Other abnormalities of gait and mobility: Secondary | ICD-10-CM | POA: Insufficient documentation

## 2024-04-30 DIAGNOSIS — M6281 Muscle weakness (generalized): Secondary | ICD-10-CM | POA: Diagnosis present

## 2024-04-30 DIAGNOSIS — R509 Fever, unspecified: Secondary | ICD-10-CM | POA: Diagnosis not present

## 2024-04-30 LAB — CULTURE, BLOOD (SINGLE)
MICRO NUMBER:: 17052629
Result:: NO GROWTH
SPECIMEN QUALITY:: ADEQUATE

## 2024-04-30 LAB — HEPATITIS PANEL, ACUTE
Hep A IgM: NONREACTIVE
Hep B C IgM: NONREACTIVE
Hepatitis B Surface Ag: NONREACTIVE
Hepatitis C Ab: NONREACTIVE

## 2024-04-30 LAB — ANA,IFA RA DIAG PNL W/RFLX TIT/PATN
Anti Nuclear Antibody (ANA): NEGATIVE
Cyclic Citrullin Peptide Ab: <16 U
Rheumatoid fact SerPl-aCnc: <10 [IU]/mL (ref <14)

## 2024-04-30 LAB — HEPATITIS A ANTIBODY, TOTAL: Hepatitis A AB,Total: REACTIVE — AB

## 2024-04-30 LAB — HIV-1 RNA QUANT-NO REFLEX-BLD
HIV 1 RNA Quant: NOT DETECTED {copies}/mL
HIV-1 RNA Quant, Log: NOT DETECTED {Log_copies}/mL

## 2024-04-30 LAB — RPR: RPR Ser Ql: NONREACTIVE

## 2024-04-30 LAB — C-REACTIVE PROTEIN: CRP: 4.1 mg/L (ref <8.0)

## 2024-04-30 LAB — SEDIMENTATION RATE: Sed Rate: 17 mm/h (ref 0–20)

## 2024-04-30 NOTE — Therapy (Signed)
 OUTPATIENT PHYSICAL THERAPY THORACOLUMBAR EVALUATION   Patient Name: Robert [name] MRN: [id number] DOB:Nov 11, 1955, 68 y.o., male Today's Date: 04/30/2024  END OF SESSION:  PT End of Session - 04/30/24 1823     Visit Number 1    Number of Visits 17    Date for Recertification  06/25/24    PT Start Time 1745    PT Stop Time 1825    PT Time Calculation (min) 40 min    Activity Tolerance Patient tolerated treatment well    Behavior During Therapy WFL for tasks assessed/performed          Past Medical History:  Diagnosis Date   Allergic rhinitis due to pollen    Arthritis    Asthma    Back pain    Barrett esophagus    Depressive disorder    Diabetes mellitus type 2 in obese    Exposure to hepatitis B    Exposure to hepatitis C    Narcotic abuse (HCC)    pain medications   OSA (obstructive sleep apnea)    on CPAP    Past Surgical History:  Procedure Laterality Date   ABDOMINAL SURGERY     AMPUTATION Right 06/11/2018   Procedure: RIGHT 2ND RAY AMPUTATION;  Surgeon: Harden Jerona GAILS, MD;  Location: [facility] OR;  Service: Orthopedics;  Laterality: Right;   AMPUTATION TOE Left 06/02/2022   Procedure: LEFT FOURTH PARTIAL TOE AMPUTATION;  Surgeon: Silva Juliene SAUNDERS, DPM;  Location: [facility];  Service: Podiatry;  Laterality: Left;   AMPUTATION TOE Right 04/11/2023   Procedure: AMPUTATION TOE;  Surgeon: Malvin Marsa FALCON, DPM;  Location: [facility] OR;  Service: Orthopedics/Podiatry;  Laterality: Right;  Right 3rd toe amputation   BACK SURGERY     Rods and screws in lumbar area   COLONOSCOPY     ESOPHAGOGASTRODUODENOSCOPY  multiple   HERNIA REPAIR     6   IR INJECT/THERA/INC NEEDLE/CATH/PLC EPI/LUMB/SAC W/IMG  02/04/2024   TOTAL KNEE ARTHROPLASTY  2009   left   Patient Active Problem List   Diagnosis Date Noted   Spinal stenosis of lumbar region 02/17/2024   Low back pain 02/02/2024   Type 2 diabetes mellitus with diabetic peripheral angiopathy without gangrene, without long-term  current use of insulin  (HCC) 04/11/2023   Acute osteomyelitis of toe, right (HCC) 04/10/2023   Secondary hypertension 04/10/2023   Type 2 diabetes mellitus (HCC) 06/02/2022   Conjunctivitis due to adenovirus, left eye 09/11/2018   Type 2 diabetes mellitus with foot ulcer (CODE) (HCC) 08/21/2018   Amputation of second toe, right, traumatic, sequela 08/21/2018   Osteomyelitis of fourth toe of left foot (HCC)    Lower extremity cellulitis 06/08/2018   Hepatitis C antibody test positive 04/16/2018   Osteomyelitis of toe of right foot (HCC) 04/10/2018   Hyperlipidemia 04/08/2018   Recurrent cellulitis of lower extremity 04/07/2018   Chronic neuropathy of both feet-  due to lumbar radiculopathy as well as diabetic neuropathy 03/25/2018   Pancytopenia (HCC) 03/15/2018   DJD (degenerative joint disease) 01/09/2018   Diabetic foot infection (HCC) 09/30/2017   Essential hypertension 09/30/2017   Mixed diabetic hyperlipidemia associated with type 2 diabetes mellitus (HCC) 09/30/2017   Barrett's esophagus 12/07/2016   Chronic venous stasis dermatitis of both lower extremities 05/08/2016   Vitamin D  deficiency 05/08/2016   OSA (obstructive sleep apnea) 04/11/2016   Bilateral lower extremity pitting edema 04/11/2016   Physical deconditioning 04/11/2016   Restrictive lung disease secondary to obesity 04/11/2016   History  of tobacco use 04/11/2016   h/o Depressive disorder 04/11/2016   Lumbar radiculopathy 07/21/2012   Chronic pain due to injury 07/21/2012   Morbid obesity (HCC) 02/15/2012   Postlaminectomy syndrome, lumbar region 01/04/2012   h/o Narcotic abuse 01/04/2012   Constipation 09/22/2011   Chronic back pain 09/22/2011    PCP: [facility]  REFERRING PROVIDER: Orlean Suzen Lacks, NP  REFERRING DIAG: Lumbar radiculopathy [M54.16]   Rationale for Evaluation and Treatment: Rehabilitation  THERAPY DIAG:  Other low back pain  Other abnormalities of gait  and mobility  Muscle weakness (generalized)  PERTINENT HISTORY: DM2,OSA, HTN,   WEIGHT BEARING RESTRICTIONS: No  FALLS:  Has patient fallen in last 6 months? No  LIVING ENVIRONMENT: Lives with: lives with their family Lives in: House/apartment Stairs: Yes: External: 3 steps; can reach both Has following equipment at home: Vannie - 2 wheeled  OCCUPATION: Not currently working   PRECAUTIONS: None ---------------------------------------------------------------------------------------------  SUBJECTIVE:                                                                                                                                                                                           SUBJECTIVE STATEMENT: Eval statement 04/30/2024: Has multilevel lumbar fusion on Aug 4rh, wants to start moving better and walk better.  Pain currently 5/10 Some n/t down LLE  RED FLAGS: None    PLOF: Independent  PATIENT GOALS: walk better  NEXT MD VISIT: October 26th ---------------------------------------------------------------------------------------------  OBJECTIVE:  Note: Objective measures were completed at Evaluation unless otherwise noted.  DIAGNOSTIC FINDINGS:  FINDINGS: Three fluoroscopic spot views of the lumbar spine submitted from the operating room. Previous L4-L5 fusion. Subsequent pedicle screws at L3 and S1 with L3-L4 and L5-S1 interbody spacers. Fluoroscopy time 71.2 seconds. Dose 126.64 mGy.   IMPRESSION: Intraoperative fluoroscopy during lumbar spine surgery.     Electronically Signed   By: Andrea Gasman M.D.   On: 02/17/2024 18:45    PATIENT SURVEYS:  MODI: 19/50  COGNITION: Overall cognitive status: Within functional limits for tasks assessed   PALPATION: Not TTP   SENSATION: Light touch: Impaired   MUSCLE LENGTH: Hamstrings: Right 20d; deg; Left 20d deg  Thomas test: 1/2 joint tightness  POSTURE: flexed trunk    LUMBAR ROM:    AROM eval  Flexion 40%  Extension 0%  Right lateral flexion   Left lateral flexion   Right rotation   Left rotation    (Blank rows = not tested)  ! Indicates pain with testing  LOWER EXTREMITY ROM:     Active  Right eval Left eval  Hip flexion    Hip extension  Hip abduction    Hip adduction    Hip internal rotation    Hip external rotation    Knee flexion    Knee extension    Ankle dorsiflexion    Ankle plantarflexion    Ankle inversion    Ankle eversion     (Blank rows = not tested)  ! Indicates pain with testing  LOWER EXTREMITY MMT:    MMT Right eval Left eval  Hip flexion 4 4  Hip extension 4 4  Hip abduction    Hip adduction    Hip internal rotation    Hip external rotation    Knee flexion    Knee extension    Ankle dorsiflexion    Ankle plantarflexion    Ankle inversion    Ankle eversion     (Blank rows = not tested)   ! Indicates pain with testing LUMBAR SPECIAL TESTS:  Unable to tolerate tests  FUNCTIONAL TESTS:  5 STS: 14.57  GAIT: Distance walked: 100' Assistive device utilized: None Level of assistance: Complete Independence Comments:   [facility] Adult PT Treatment:                                                DATE: 04/30/2024  Self Care: Pt education POC discussion                                                                                                                                PATIENT EDUCATION:  Education details: Pt received education regarding HEP performance, ADL performance, functional activity tolerance, impairment education, appropriate performance of therapeutic activities.  Person educated: Patient Education method: Explanation, Demonstration, Tactile cues, Verbal cues, and Handouts Education comprehension: verbalized understanding and returned demonstration  HOME EXERCISE PROGRAM: Access Code: [email or web address] URL: [email or web address] Date: 04/30/2024 Prepared by: Mabel Kiang  Exercises - Mini Squat with Counter Support  - 1 x daily - 4 x weekly - 3 sets - 10 reps - 2s hold - Bent Knee Fallouts  - 1 x daily - 4 x weekly - 3 sets - 12 reps - 2s hold - Abdominal Bracing  - 1 x daily - 4 x weekly - 2 sets - 10 reps - 6s hold ---------------------------------------------------------------------------------------------  ASSESSMENT:  CLINICAL IMPRESSION: Eval impression (04/30/2024): Pt. attended today's physical therapy session for evaluation of LBP. Pt has complaints of functional deconditioning and mobility following multilevel lumbar fusion on August 8th, 2025. Pt has notable deficits and would benefit from therapeutic focus on lumbar AROM, Proximal hip strength, dynamic core activation patterns, and hip hinge mechanics.  Treatment performed today focused on pt education detailed in the objective. Pt demonstrated great understanding of education provided. required minimal v/t cues and no assistance for appropriate performance with today's activities. Pt requires the intervention of skilled outpatient physical therapy  to address the aforementioned deficits and progress towards a functional level in line with therapeutic goals.    OBJECTIVE IMPAIRMENTS: Abnormal gait, decreased activity tolerance, decreased mobility, difficulty walking, decreased ROM, decreased strength, hypomobility, improper body mechanics, postural dysfunction, and pain.   ACTIVITY LIMITATIONS: carrying, lifting, bending, standing, squatting, transfers, bed mobility, and locomotion level  PARTICIPATION LIMITATIONS: meal prep, cleaning, laundry, interpersonal relationship, driving, shopping, and community activity  PERSONAL FACTORS: Age, Fitness, Past/current experiences, and Time since onset of injury/illness/exacerbation are also affecting patient's functional outcome.   REHAB POTENTIAL: Fair see personal factors  CLINICAL DECISION MAKING: Stable/uncomplicated  EVALUATION COMPLEXITY:  Low   GOALS: Goals reviewed with patient? YES  SHORT TERM GOALS: Target date: 05/28/2024  Pt will be independent with administered HEP to demonstrate the competency necessary for long term managemnet of symptoms at home. Baseline: Goal status: INITIAL   LONG TERM GOALS: Target date: 06/25/2024  Pt. Will achieve a MODI score of 13/50 as to demonstrate improvement in self-perceived functional ability with daily activities.  Baseline: 19/50(38%) Goal status: INITIAL  2.  Pt will improve Global hip strength to a 4+/5 to demonstrate improvement in strength for quality of motion and activity performance.  Baseline:  Goal status: INITIAL  3.  Pt will improve Thoracolumbar AROM to 60% of standardized norms with less than 3/10 pain to demonstrate necessary mobility for high quality and safe ADLs  Baseline: 5/10 Goal status: INITIAL  4.   Pt will independently ambulate 1000' with no AD and less than 3/10 pain to demonstrate improved weightbearing tolerance, BLE strength, and functional capacity for community ambulation.  Baseline:  Goal status: INITIAL  5.  Pt will demonstrate x5 squat to parallel using appropriate hip hinge technique necessary for ADL performance and compensatory functional motion in place of lumbar mobility. Baseline:  Goal status: INITIAL  ---------------------------------------------------------------------------------------------  PLAN:  PT FREQUENCY: 2x/week  PT DURATION: 8 weeks  PLANNED INTERVENTIONS: 97110-Therapeutic exercises, 97530- Therapeutic activity, 97112- Neuromuscular re-education, 97535- Self Care, [id number]- Manual therapy, [phone]- Gait training, [phone] (1-2 muscles), [phone] (3+ muscles)- Dry Needling, Patient/Family education, Balance training, Stair training, Taping, Joint mobilization, Spinal mobilization, DME instructions, Cryotherapy, and Moist heat.  PLAN FOR NEXT SESSION: Review HEP, Begin POC as detailed in assessment   Mabel Kiang, PT,  DPT 04/30/2024, 6:25 PM   Date of referral: 03/06/2024 Referring provider: Orlean Suzen Lacks, NP Referring diagnosis? Lumbar radiculopathy Treatment diagnosis? (if different than referring diagnosis) Low back pain, gait mobility deficits  What was this (referring dx) caused by? Surgery (Type: lumbar fusion)  Nature of Condition: Initial Onset (within last 3 months)   Laterality: Both  Current Functional Measure Score: Back Index 19/50  Objective measurements identify impairments when they are compared to normal values, the uninvolved extremity, and prior level of function.  [x]  Yes  []  No  Objective assessment of functional ability: Severe functional limitations   Briefly describe symptoms: limited mobillity,strength and walking   How did symptoms start: surgery  Average pain intensity:  Last 24 hours: 5/10  Past week: 7/10  How often does the pt experience symptoms? Frequently  How much have the symptoms interfered with usual daily activities? Quite a bit  How has condition changed since care began at this facility? NA - initial visit  In general, how is the patients overall health? Fair   BACK PAIN (STarT Back Screening Tool) Has pain spread down the leg(s) at some time in the last 2 weeks? Y Has there been pain in the  shoulder or neck at some time in the last 2 weeks? n Has the pt only walked short distances because of back pain? y Has patient dressed more slowly because of back pain in the past 2 weeks? y Does patient think it's not safe for a person with this condition to be physically active? y Does patient have worrying thoughts a lot of the time? n Does patient feel back pain is terrible and will never get any better? n Has patient stopped enjoying things they usually enjoy? y

## 2024-04-30 NOTE — Progress Notes (Signed)
 Patient: Robert [name]  DOB: January 28, 1956 MRN: [id number] PCP: [facility]    Chief Complaint  Patient presents with   Follow-up     Patient Active Problem List   Diagnosis Date Noted   Spinal stenosis of lumbar region 02/17/2024   Low back pain 02/02/2024   Type 2 diabetes mellitus with diabetic peripheral angiopathy without gangrene, without long-term current use of insulin  (HCC) 04/11/2023   Acute osteomyelitis of toe, right (HCC) 04/10/2023   Secondary hypertension 04/10/2023   Type 2 diabetes mellitus (HCC) 06/02/2022   Conjunctivitis due to adenovirus, left eye 09/11/2018   Type 2 diabetes mellitus with foot ulcer (CODE) (HCC) 08/21/2018   Amputation of second toe, right, traumatic, sequela 08/21/2018   Osteomyelitis of fourth toe of left foot (HCC)    Lower extremity cellulitis 06/08/2018   Hepatitis C antibody test positive 04/16/2018   Osteomyelitis of toe of right foot (HCC) 04/10/2018   Hyperlipidemia 04/08/2018   Recurrent cellulitis of lower extremity 04/07/2018   Chronic neuropathy of both feet-  due to lumbar radiculopathy as well as diabetic neuropathy 03/25/2018   Pancytopenia (HCC) 03/15/2018   DJD (degenerative joint disease) 01/09/2018   Diabetic foot infection (HCC) 09/30/2017   Essential hypertension 09/30/2017   Mixed diabetic hyperlipidemia associated with type 2 diabetes mellitus (HCC) 09/30/2017   Barrett's esophagus 12/07/2016   Chronic venous stasis dermatitis of both lower extremities 05/08/2016   Vitamin D  deficiency 05/08/2016   OSA (obstructive sleep apnea) 04/11/2016   Bilateral lower extremity pitting edema 04/11/2016   Physical deconditioning 04/11/2016   Restrictive lung disease secondary to obesity 04/11/2016   History of tobacco use 04/11/2016   h/o Depressive disorder 04/11/2016   Lumbar radiculopathy 07/21/2012   Chronic pain due to injury 07/21/2012   Morbid obesity (HCC) 02/15/2012   Postlaminectomy  syndrome, lumbar region 01/04/2012   h/o Narcotic abuse 01/04/2012   Constipation 09/22/2011   Chronic back pain 09/22/2011     Subjective:  Robert [name] is a 68 year old male with history of lumabar stenosis with hernaition sp PLIF on on 02/17/24, asthma, back pain, Barrett's esophagus, diabetes type 2, narcotic abuse with pain meds presents for referral for a fever of unknown origin.   He was seen by family practice and referred to infectious disease given fever noted at that point on 9/17 been going on for about 6 months.  Noted happens monthly.  Recurrent temps of 101-102.    04/16/24 : Discussed the use of AI scribe software for clinical note transcription with the patient, who gave verbal consent to proceed. He has been experiencing periodic fevers for the past year, occurring approximately once a month. These fevers typically last for three to four days and reach temperatures of 101-102F. The fevers often start suddenly without any specific triggers. During these episodes, he experiences significant fatigue, feeling 'hot and then cold' and often needing to sleep extensively.   In conjunction with the fevers, he experiences joint swelling, particularly in the distal joints of his fingers and occasionally in his knees. The joint swelling is painful and occurs concurrently with the fever episodes. No associated headaches or abdominal pain during these episodes.   His past medical history includes significant childhood illnesses, including multiple hospitalizations for pneumonia and a history of hepatitis A at age 59. He also has a history of back pain and surgery, as well as recurrent cellulitis, primarily affecting his legs. The cellulitis episodes are characterized by a rash and  have been a frequent cause of hospitalization.   Socially, he is a psychologist, clinical, although he has not been performing publicly since the COVID-19 pandemic. 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 He denies any recent  travel outside the country, with his last international travel being over 15 years ago. He has a history of smoking marijuana but denies any other drug use or current alcohol consumption.   He is not aware of any family history of autoimmune diseases, although his mother has irritable bowel syndrome (IBS). He is currently undergoing evaluation for gastrointestinal issues. Today: doing well. Afebrile since LV. Seen by orhto with xray showing degenerative disease of hand.  Review of Systems  All other systems reviewed and are negative.   Past Medical History:  Diagnosis Date   Allergic rhinitis due to pollen    Arthritis    Asthma    Back pain    Barrett esophagus    Depressive disorder    Diabetes mellitus type 2 in obese    Exposure to hepatitis B    Exposure to hepatitis C    Narcotic abuse (HCC)    pain medications   OSA (obstructive sleep apnea)    on CPAP     Outpatient Medications Prior to Visit  Medication Sig Dispense Refill   APPLE CIDER VINEGAR PO Take 15 mLs by mouth 2 (two) times daily.      B Complex Vitamins (B COMPLEX 100 PO) Take 1 capsule by mouth daily.     Cholecalciferol  (VITAMIN D3) 125 MCG (5000 UT) CAPS Take 5,000 Units by mouth daily.     cyclobenzaprine  (FLEXERIL ) 10 MG tablet Take 0.5-1 tablets (5-10 mg total) by mouth 3 (three) times daily as needed for muscle spasms. 30 tablet 0   FARXIGA  10 MG TABS tablet Take 10 mg by mouth daily.     furosemide  (LASIX ) 40 MG tablet Take 20-40 mg by mouth daily as needed for fluid or edema.     GINSENG PO Take 1 tablet by mouth 2 (two) times a week.     Magnesium  (RA NATURAL MAGNESIUM ) 250 MG TABS Daily 2     magnesium  oxide (MAG-OX) 400 (240 Mg) MG tablet Take 400 mg by mouth every evening.     methocarbamol  (ROBAXIN ) 750 MG tablet Take 1 tablet (750 mg total) by mouth 3 (three) times daily. 40 tablet 1   NARCAN  4 MG/0.1ML LIQD nasal spray kit Place 1 spray into the nose daily as needed (overdose).     oxyCODONE   (OXY IR/ROXICODONE ) 5 MG immediate release tablet Take 3 tablets (15 mg total) by mouth every 4 (four) hours as needed for severe pain (pain score 7-10) or moderate pain (pain score 4-6) (5mg  for moderate pain and 10 mg for severe). 30 tablet 0   OZEMPIC , 0.25 OR 0.5 MG/DOSE, 2 MG/3ML SOPN SMARTSIG:0.5 Milligram(s) Once a Week     Potassium 99 MG TABS Take 2 tablets by mouth in the morning and at bedtime.     rosuvastatin  (CRESTOR ) 5 MG tablet Take 1 tablet (5 mg total) by mouth daily. 30 tablet 3   valACYclovir  (VALTREX ) 1000 MG tablet TAKE 1 TABLET BY MOUTH DAILY AS NEEDED (FOR OUTBREAKS ONLY). 5 tablet 0   Vitamin A  3 MG (10000 UT) TABS Take 1 tablet by mouth daily.     vitamin C  (ASCORBIC ACID ) 500 MG tablet Take 500 mg by mouth daily.     VITAMIN E PO Take 1 tablet by mouth daily.  amLODipine -olmesartan  (AZOR ) 5-40 MG tablet Take 1 tablet by mouth daily. (Patient not taking: Reported on 04/30/2024)     blood glucose meter kit and supplies Dispense based on patient and insurance preference. Use to check glucose fasting in the AM and then after largest meal of the day.. (FOR ICD-10 E10.9, E11.9). (Patient not taking: Reported on 04/30/2024) 1 each 0   Blood Glucose Monitoring Suppl (ONE TOUCH ULTRA MINI) w/Device KIT Use to test blood sugar, test in the morning fasting and test after largest meal of the day. (Patient not taking: Reported on 04/30/2024) 1 each 0   chlorhexidine  (HIBICLENS ) 4 % external liquid Apply 15 mLs (1 Application total) topically as directed for 30 doses. Use as directed daily for 5 days every other week for 6 weeks. (Patient not taking: Reported on 04/30/2024) 946 mL 1   gabapentin  (NEURONTIN ) 300 MG capsule Take 1 capsule (300 mg total) by mouth 3 (three) times daily. (Patient not taking: Reported on 04/30/2024) 90 capsule 2   glucose blood (ONE TOUCH ULTRA TEST) test strip Use to test blood sugar, test in the morning fasting and test after largest meal of the day.  (Patient not taking: Reported on 04/30/2024) 100 each 11   ONETOUCH DELICA LANCETS 33G MISC Use for testing blood sugar, test once in the morning fasting and test after largest meal of the day. (Patient not taking: Reported on 04/30/2024) 100 each 11   tirzepatide  (MOUNJARO ) 15 MG/0.5ML Pen Inject 15 mg into the skin once a week. (Patient not taking: Reported on 04/30/2024)     traMADol  (ULTRAM ) 50 MG tablet Take 100 mg by mouth 4 (four) times daily. (Patient not taking: Reported on 04/30/2024)     No facility-administered medications prior to visit.     Allergies  Allergen Reactions   Onion Other (See Comments)    Stomach Irritation    Latex Rash    With prolonged contact    Social History   Tobacco Use   Smoking status: Former    Current packs/day: 0.00    Average packs/day: 0.8 packs/day for 15.0 years (12.0 ttl pk-yrs)    Types: Cigarettes    Start date: 07/16/1989    Quit date: 07/16/2004    Years since quitting: 19.8   Smokeless tobacco: Never  Vaping Use   Vaping status: Never Used  Substance Use Topics   Alcohol use: No   Drug use: No    Comment: Prior abuse of narcotics    Family History  Problem Relation Age of Onset   Allergies Mother    Early death Father    Thyroid  disease Sister    Colon cancer Neg Hx    Stomach cancer Neg Hx     Objective:   Vitals:   04/30/24 1609  BP: (!) 145/3  Pulse: 90  Temp: 98.4 F (36.9 C)  TempSrc: Oral  SpO2: 95%  Weight: (!) 327 lb (148.3 kg)   Body mass index is 45.61 kg/m.  Physical Exam Constitutional:      General: He is not in acute distress.    Appearance: He is normal weight. He is not toxic-appearing.  HENT:     Head: Normocephalic and atraumatic.     Right Ear: External ear normal.     Left Ear: External ear normal.     Nose: No congestion or rhinorrhea.     Mouth/Throat:     Mouth: Mucous membranes are moist.     Pharynx: Oropharynx is clear.  Eyes:  Extraocular Movements: Extraocular movements  intact.     Conjunctiva/sclera: Conjunctivae normal.     Pupils: Pupils are equal, round, and reactive to light.  Cardiovascular:     Rate and Rhythm: Normal rate and regular rhythm.     Heart sounds: No murmur heard.    No friction rub. No gallop.  Pulmonary:     Effort: Pulmonary effort is normal.     Breath sounds: Normal breath sounds.  Abdominal:     General: Abdomen is flat. Bowel sounds are normal.     Palpations: Abdomen is soft.  Musculoskeletal:        General: No swelling.     Cervical back: Normal range of motion and neck supple.  Skin:    General: Skin is warm and dry.  Neurological:     General: No focal deficit present.     Mental Status: He is oriented to person, place, and time.  Psychiatric:        Mood and Affect: Mood normal.     Lab Results: Lab Results  Component Value Date   WBC 12.2 (H) 02/18/2024   HGB 9.8 (L) 02/18/2024   HCT 28.9 (L) 02/18/2024   MCV 91.2 02/18/2024   PLT 112 (L) 02/18/2024    Lab Results  Component Value Date   CREATININE 1.10 02/18/2024   BUN 22 02/18/2024   NA 135 02/18/2024   K 4.4 02/18/2024   CL 103 02/18/2024   CO2 24 02/18/2024    Lab Results  Component Value Date   ALT 19 02/04/2024   AST 16 02/04/2024   ALKPHOS 55 02/04/2024   BILITOT 0.8 02/04/2024     Assessment & Plan:  Recurrent fevers with episodic joint swelling and rash Recurrent fevers with joint swelling and rash suggest autoimmune etiology. Referred to [email or web address] No active hepatitis indicated by normal liver enzymes. Reviewed labs on 9/9 WBC 4K, SCR 0.6 LFTs within normal limits, LDL 60, hemoglobin A1c 5.7, TSH normal at 0.756. 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 They had done b/l xray which severe cmc arthrtis Plan: Afebrile since LV. Blood Cx, autoimmune work up negative(ANA/RF), rpr/Acute Hep panel negative. Negative esr and crp . Counseled to keep fever diary. Will get HIV ab.  F/u  with ID prn  Loney Stank, MD [facility]   04/30/24  4:21 PM I have personally spent 42 minutes involved in face-to-face and non-face-to-face activities for this patient on the day of the visit. Professional time spent includes the following activities: Preparing to see the patient (review of tests), Obtaining and/or reviewing separately obtained history (admission/discharge record), Performing a medically appropriate examination and/or evaluation , Ordering medications/tests/procedures, referring and communicating with other health care professionals, Documenting clinical information in the EMR, Independently interpreting results (not separately reported), Communicating results to the patient/family/caregiver, Counseling and educating the patient/family/caregiver and Care coordination (not separately reported).

## 2024-05-01 LAB — HIV ANTIBODY (ROUTINE TESTING W REFLEX)
HIV 1&2 Ab, 4th Generation: NONREACTIVE
HIV FINAL INTERPRETATION: NEGATIVE

## 2024-05-11 ENCOUNTER — Ambulatory Visit

## 2024-05-11 DIAGNOSIS — M5459 Other low back pain: Secondary | ICD-10-CM | POA: Diagnosis not present

## 2024-05-11 DIAGNOSIS — R2689 Other abnormalities of gait and mobility: Secondary | ICD-10-CM

## 2024-05-11 DIAGNOSIS — M6281 Muscle weakness (generalized): Secondary | ICD-10-CM

## 2024-05-11 NOTE — Therapy (Signed)
 OUTPATIENT PHYSICAL THERAPY TREATMENT NOTE   Patient Name: Robert [name] MRN: [id number] DOB:1955/10/04, 68 y.o., male Today's Date: 05/11/2024  END OF SESSION:  PT End of Session - 05/11/24 1002     Visit Number 2    Number of Visits 17    Date for Recertification  06/25/24    PT Start Time 1001    PT Stop Time 1041    PT Time Calculation (min) 40 min    Activity Tolerance Patient tolerated treatment well    Behavior During Therapy WFL for tasks assessed/performed          Past Medical History:  Diagnosis Date   Allergic rhinitis due to pollen    Arthritis    Asthma    Back pain    Barrett esophagus    Depressive disorder    Diabetes mellitus type 2 in obese    Exposure to hepatitis B    Exposure to hepatitis C    Narcotic abuse (HCC)    pain medications   OSA (obstructive sleep apnea)    on CPAP    Past Surgical History:  Procedure Laterality Date   ABDOMINAL SURGERY     AMPUTATION Right 06/11/2018   Procedure: RIGHT 2ND RAY AMPUTATION;  Surgeon: Harden Jerona GAILS, MD;  Location: [facility] OR;  Service: Orthopedics;  Laterality: Right;   AMPUTATION TOE Left 06/02/2022   Procedure: LEFT FOURTH PARTIAL TOE AMPUTATION;  Surgeon: Silva Juliene SAUNDERS, DPM;  Location: [facility];  Service: Podiatry;  Laterality: Left;   AMPUTATION TOE Right 04/11/2023   Procedure: AMPUTATION TOE;  Surgeon: Malvin Marsa FALCON, DPM;  Location: [facility] OR;  Service: Orthopedics/Podiatry;  Laterality: Right;  Right 3rd toe amputation   BACK SURGERY     Rods and screws in lumbar area   COLONOSCOPY     ESOPHAGOGASTRODUODENOSCOPY  multiple   HERNIA REPAIR     6   IR INJECT/THERA/INC NEEDLE/CATH/PLC EPI/LUMB/SAC W/IMG  02/04/2024   TOTAL KNEE ARTHROPLASTY  2009   left   Patient Active Problem List   Diagnosis Date Noted   Spinal stenosis of lumbar region 02/17/2024   Low back pain 02/02/2024   Type 2 diabetes mellitus with diabetic peripheral angiopathy without gangrene, without long-term current use  of insulin  (HCC) 04/11/2023   Acute osteomyelitis of toe, right (HCC) 04/10/2023   Secondary hypertension 04/10/2023   Type 2 diabetes mellitus (HCC) 06/02/2022   Conjunctivitis due to adenovirus, left eye 09/11/2018   Type 2 diabetes mellitus with foot ulcer (CODE) (HCC) 08/21/2018   Amputation of second toe, right, traumatic, sequela 08/21/2018   Osteomyelitis of fourth toe of left foot (HCC)    Lower extremity cellulitis 06/08/2018   Hepatitis C antibody test positive 04/16/2018   Osteomyelitis of toe of right foot (HCC) 04/10/2018   Hyperlipidemia 04/08/2018   Recurrent cellulitis of lower extremity 04/07/2018   Chronic neuropathy of both feet-  due to lumbar radiculopathy as well as diabetic neuropathy 03/25/2018   Pancytopenia (HCC) 03/15/2018   DJD (degenerative joint disease) 01/09/2018   Diabetic foot infection (HCC) 09/30/2017   Essential hypertension 09/30/2017   Mixed diabetic hyperlipidemia associated with type 2 diabetes mellitus (HCC) 09/30/2017   Barrett's esophagus 12/07/2016   Chronic venous stasis dermatitis of both lower extremities 05/08/2016   Vitamin D  deficiency 05/08/2016   OSA (obstructive sleep apnea) 04/11/2016   Bilateral lower extremity pitting edema 04/11/2016   Physical deconditioning 04/11/2016   Restrictive lung disease secondary to obesity 04/11/2016   History  of tobacco use 04/11/2016   h/o Depressive disorder 04/11/2016   Lumbar radiculopathy 07/21/2012   Chronic pain due to injury 07/21/2012   Morbid obesity (HCC) 02/15/2012   Postlaminectomy syndrome, lumbar region 01/04/2012   h/o Narcotic abuse 01/04/2012   Constipation 09/22/2011   Chronic back pain 09/22/2011    PCP: [facility]  REFERRING PROVIDER: Orlean Suzen Lacks, NP  REFERRING DIAG: Lumbar radiculopathy [M54.16]   Rationale for Evaluation and Treatment: Rehabilitation  THERAPY DIAG:  Other low back pain  Other abnormalities of gait and  mobility  Muscle weakness (generalized)  PERTINENT HISTORY: DM2,OSA, HTN,   WEIGHT BEARING RESTRICTIONS: No  FALLS:  Has patient fallen in last 6 months? No  LIVING ENVIRONMENT: Lives with: lives with their family Lives in: House/apartment Stairs: Yes: External: 3 steps; can reach both Has following equipment at home: Walker - 2 wheeled  OCCUPATION: Not currently working   PRECAUTIONS: None ---------------------------------------------------------------------------------------------  SUBJECTIVE:                                                                                                                                                                                           SUBJECTIVE STATEMENT: Patient reports 3/10 pain today in his back, has been compliant with his HEP.   Eval statement 04/30/2024: Has multilevel lumbar fusion on Aug 4rh, wants to start moving better and walk better.  Pain currently 5/10 Some n/t down LLE  RED FLAGS: None    PLOF: Independent  PATIENT GOALS: walk better  NEXT MD VISIT: October 26th ---------------------------------------------------------------------------------------------  OBJECTIVE:  Note: Objective measures were completed at Evaluation unless otherwise noted.  DIAGNOSTIC FINDINGS:  FINDINGS: Three fluoroscopic spot views of the lumbar spine submitted from the operating room. Previous L4-L5 fusion. Subsequent pedicle screws at L3 and S1 with L3-L4 and L5-S1 interbody spacers. Fluoroscopy time 71.2 seconds. Dose 126.64 mGy.   IMPRESSION: Intraoperative fluoroscopy during lumbar spine surgery.     Electronically Signed   By: Andrea Gasman M.D.   On: 02/17/2024 18:45    PATIENT SURVEYS:  MODI: 19/50  COGNITION: Overall cognitive status: Within functional limits for tasks assessed   PALPATION: Not TTP   SENSATION: Light touch: Impaired   MUSCLE LENGTH: Hamstrings: Right 20d; deg; Left 20d  deg  Thomas test: 1/2 joint tightness  POSTURE: flexed trunk    LUMBAR ROM:   AROM eval  Flexion 40%  Extension 0%  Right lateral flexion   Left lateral flexion   Right rotation   Left rotation    (Blank rows = not tested)  ! Indicates pain with testing  LOWER EXTREMITY ROM:  Active  Right eval Left eval  Hip flexion    Hip extension    Hip abduction    Hip adduction    Hip internal rotation    Hip external rotation    Knee flexion    Knee extension    Ankle dorsiflexion    Ankle plantarflexion    Ankle inversion    Ankle eversion     (Blank rows = not tested)  ! Indicates pain with testing  LOWER EXTREMITY MMT:    MMT Right eval Left eval  Hip flexion 4 4  Hip extension 4 4  Hip abduction    Hip adduction    Hip internal rotation    Hip external rotation    Knee flexion    Knee extension    Ankle dorsiflexion    Ankle plantarflexion    Ankle inversion    Ankle eversion     (Blank rows = not tested)   ! Indicates pain with testing LUMBAR SPECIAL TESTS:  Unable to tolerate tests  FUNCTIONAL TESTS:  5 STS: 14.57  GAIT: Distance walked: 100' Assistive device utilized: None Level of assistance: Complete Independence Comments:   [facility] Adult PT Treatment:                                                DATE: 05/11/24 Therapeutic Exercise: Nustep level 5 x 6 mins while gathering subjective info and planning session with patient Standing hip abduction/extension x10 ea BIL Mini squats with UE support 2x10 Seated hamstring stretch 2x30 BIL Seated pball roll outs fwd/lat x10 ea Neuromuscular re-ed: Seated pball press downs 5 2x10  [facility] Adult PT Treatment:                                                DATE: 04/30/2024  Self Care: Pt education POC discussion                                                                                                                                PATIENT EDUCATION:  Education details: Pt received  education regarding HEP performance, ADL performance, functional activity tolerance, impairment education, appropriate performance of therapeutic activities.  Person educated: Patient Education method: Explanation, Demonstration, Tactile cues, Verbal cues, and Handouts Education comprehension: verbalized understanding and returned demonstration  HOME EXERCISE PROGRAM: Access Code: [email or web address] URL: [email or web address] Date: 04/30/2024 Prepared by: Mabel Kiang  Exercises - Mini Squat with Counter Support  - 1 x daily - 4 x weekly - 3 sets - 10 reps - 2s hold - Bent Knee Fallouts  - 1 x daily - 4 x weekly - 3 sets - 12 reps - 2s hold -  Abdominal Bracing  - 1 x daily - 4 x weekly - 2 sets - 10 reps - 6s hold ---------------------------------------------------------------------------------------------  ASSESSMENT:  CLINICAL IMPRESSION: Patient presents to first follow up PT session reporting 3/10 back pain and that he has been compliant with his HEP. Session today focused on proximal hip and core strengthening. Patient was able to tolerate all prescribed exercises with no adverse effects. Patient continues to benefit from skilled PT services and should be progressed as able to improve functional independence.   Eval impression (04/30/2024): Pt. attended today's physical therapy session for evaluation of LBP. Pt has complaints of functional deconditioning and mobility following multilevel lumbar fusion on August 8th, 2025. Pt has notable deficits and would benefit from therapeutic focus on lumbar AROM, Proximal hip strength, dynamic core activation patterns, and hip hinge mechanics.  Treatment performed today focused on pt education detailed in the objective. Pt demonstrated great understanding of education provided. required minimal v/t cues and no assistance for appropriate performance with today's activities. Pt requires the intervention of skilled outpatient physical therapy to  address the aforementioned deficits and progress towards a functional level in line with therapeutic goals.    OBJECTIVE IMPAIRMENTS: Abnormal gait, decreased activity tolerance, decreased mobility, difficulty walking, decreased ROM, decreased strength, hypomobility, improper body mechanics, postural dysfunction, and pain.   ACTIVITY LIMITATIONS: carrying, lifting, bending, standing, squatting, transfers, bed mobility, and locomotion level  PARTICIPATION LIMITATIONS: meal prep, cleaning, laundry, interpersonal relationship, driving, shopping, and community activity  PERSONAL FACTORS: Age, Fitness, Past/current experiences, and Time since onset of injury/illness/exacerbation are also affecting patient's functional outcome.   REHAB POTENTIAL: Fair see personal factors  CLINICAL DECISION MAKING: Stable/uncomplicated  EVALUATION COMPLEXITY: Low   GOALS: Goals reviewed with patient? YES  SHORT TERM GOALS: Target date: 05/28/2024  Pt will be independent with administered HEP to demonstrate the competency necessary for long term managemnet of symptoms at home. Baseline: Goal status: INITIAL   LONG TERM GOALS: Target date: 06/25/2024  Pt. Will achieve a MODI score of 13/50 as to demonstrate improvement in self-perceived functional ability with daily activities.  Baseline: 19/50(38%) Goal status: INITIAL  2.  Pt will improve Global hip strength to a 4+/5 to demonstrate improvement in strength for quality of motion and activity performance.  Baseline:  Goal status: INITIAL  3.  Pt will improve Thoracolumbar AROM to 60% of standardized norms with less than 3/10 pain to demonstrate necessary mobility for high quality and safe ADLs  Baseline: 5/10 Goal status: INITIAL  4.   Pt will independently ambulate 1000' with no AD and less than 3/10 pain to demonstrate improved weightbearing tolerance, BLE strength, and functional capacity for community ambulation.  Baseline:  Goal status:  INITIAL  5.  Pt will demonstrate x5 squat to parallel using appropriate hip hinge technique necessary for ADL performance and compensatory functional motion in place of lumbar mobility. Baseline:  Goal status: INITIAL  ---------------------------------------------------------------------------------------------  PLAN:  PT FREQUENCY: 2x/week  PT DURATION: 8 weeks  PLANNED INTERVENTIONS: 97110-Therapeutic exercises, 97530- Therapeutic activity, 97112- Neuromuscular re-education, 97535- Self Care, [id number]- Manual therapy, [phone]- Gait training, [phone] (1-2 muscles), [phone] (3+ muscles)- Dry Needling, Patient/Family education, Balance training, Stair training, Taping, Joint mobilization, Spinal mobilization, DME instructions, Cryotherapy, and Moist heat.  PLAN FOR NEXT SESSION: Review HEP, Begin POC as detailed in assessment   Corean Pouch PTA  05/11/2024, 10:42 AM

## 2024-05-13 ENCOUNTER — Other Ambulatory Visit: Payer: Self-pay | Admitting: Student

## 2024-05-13 DIAGNOSIS — M5416 Radiculopathy, lumbar region: Secondary | ICD-10-CM

## 2024-05-18 ENCOUNTER — Ambulatory Visit: Attending: Student

## 2024-05-18 ENCOUNTER — Ambulatory Visit
Admission: RE | Admit: 2024-05-18 | Discharge: 2024-05-18 | Disposition: A | Source: Ambulatory Visit | Attending: Student

## 2024-05-18 DIAGNOSIS — M6281 Muscle weakness (generalized): Secondary | ICD-10-CM | POA: Insufficient documentation

## 2024-05-18 DIAGNOSIS — R2689 Other abnormalities of gait and mobility: Secondary | ICD-10-CM | POA: Insufficient documentation

## 2024-05-18 DIAGNOSIS — M5459 Other low back pain: Secondary | ICD-10-CM | POA: Insufficient documentation

## 2024-05-18 DIAGNOSIS — M5416 Radiculopathy, lumbar region: Secondary | ICD-10-CM

## 2024-05-18 NOTE — Therapy (Signed)
 OUTPATIENT PHYSICAL THERAPY TREATMENT NOTE   Patient Name: Robert [name] MRN: [id number] DOB:13-May-1956, 68 y.o., male Today's Date: 05/18/2024  END OF SESSION:  PT End of Session - 05/18/24 1135     Visit Number 3    Date for Recertification  06/25/24    Authorization Type 6 visits 04/30/24-06/25/24    Authorization - Visit Number 2    Authorization - Number of Visits 6    PT Start Time 1134    PT Stop Time 1214    PT Time Calculation (min) 40 min    Activity Tolerance Patient tolerated treatment well    Behavior During Therapy WFL for tasks assessed/performed          Past Medical History:  Diagnosis Date   Allergic rhinitis due to pollen    Arthritis    Asthma    Back pain    Barrett esophagus    Depressive disorder    Diabetes mellitus type 2 in obese    Exposure to hepatitis B    Exposure to hepatitis C    Narcotic abuse (HCC)    pain medications   OSA (obstructive sleep apnea)    on CPAP    Past Surgical History:  Procedure Laterality Date   ABDOMINAL SURGERY     AMPUTATION Right 06/11/2018   Procedure: RIGHT 2ND RAY AMPUTATION;  Surgeon: Harden Jerona GAILS, MD;  Location: [facility] OR;  Service: Orthopedics;  Laterality: Right;   AMPUTATION TOE Left 06/02/2022   Procedure: LEFT FOURTH PARTIAL TOE AMPUTATION;  Surgeon: Silva Juliene SAUNDERS, DPM;  Location: [facility];  Service: Podiatry;  Laterality: Left;   AMPUTATION TOE Right 04/11/2023   Procedure: AMPUTATION TOE;  Surgeon: Malvin Marsa FALCON, DPM;  Location: [facility] OR;  Service: Orthopedics/Podiatry;  Laterality: Right;  Right 3rd toe amputation   BACK SURGERY     Rods and screws in lumbar area   COLONOSCOPY     ESOPHAGOGASTRODUODENOSCOPY  multiple   HERNIA REPAIR     6   IR INJECT/THERA/INC NEEDLE/CATH/PLC EPI/LUMB/SAC W/IMG  02/04/2024   TOTAL KNEE ARTHROPLASTY  2009   left   Patient Active Problem List   Diagnosis Date Noted   Spinal stenosis of lumbar region 02/17/2024   Low back pain 02/02/2024   Type 2  diabetes mellitus with diabetic peripheral angiopathy without gangrene, without long-term current use of insulin  (HCC) 04/11/2023   Acute osteomyelitis of toe, right (HCC) 04/10/2023   Secondary hypertension 04/10/2023   Type 2 diabetes mellitus (HCC) 06/02/2022   Conjunctivitis due to adenovirus, left eye 09/11/2018   Type 2 diabetes mellitus with foot ulcer (CODE) (HCC) 08/21/2018   Amputation of second toe, right, traumatic, sequela 08/21/2018   Osteomyelitis of fourth toe of left foot (HCC)    Lower extremity cellulitis 06/08/2018   Hepatitis C antibody test positive 04/16/2018   Osteomyelitis of toe of right foot (HCC) 04/10/2018   Hyperlipidemia 04/08/2018   Recurrent cellulitis of lower extremity 04/07/2018   Chronic neuropathy of both feet-  due to lumbar radiculopathy as well as diabetic neuropathy 03/25/2018   Pancytopenia (HCC) 03/15/2018   DJD (degenerative joint disease) 01/09/2018   Diabetic foot infection (HCC) 09/30/2017   Essential hypertension 09/30/2017   Mixed diabetic hyperlipidemia associated with type 2 diabetes mellitus (HCC) 09/30/2017   Barrett's esophagus 12/07/2016   Chronic venous stasis dermatitis of both lower extremities 05/08/2016   Vitamin D  deficiency 05/08/2016   OSA (obstructive sleep apnea) 04/11/2016   Bilateral lower extremity pitting edema  04/11/2016   Physical deconditioning 04/11/2016   Restrictive lung disease secondary to obesity 04/11/2016   History of tobacco use 04/11/2016   h/o Depressive disorder 04/11/2016   Lumbar radiculopathy 07/21/2012   Chronic pain due to injury 07/21/2012   Morbid obesity (HCC) 02/15/2012   Postlaminectomy syndrome, lumbar region 01/04/2012   h/o Narcotic abuse 01/04/2012   Constipation 09/22/2011   Chronic back pain 09/22/2011    PCP: [facility]  REFERRING PROVIDER: Orlean Suzen Lacks, NP  REFERRING DIAG: Lumbar radiculopathy [M54.16]   Rationale for Evaluation and  Treatment: Rehabilitation  THERAPY DIAG:  Other low back pain  Other abnormalities of gait and mobility  Muscle weakness (generalized)  PERTINENT HISTORY: DM2,OSA, HTN,   WEIGHT BEARING RESTRICTIONS: No  FALLS:  Has patient fallen in last 6 months? No  LIVING ENVIRONMENT: Lives with: lives with their family Lives in: House/apartment Stairs: Yes: External: 3 steps; can reach both Has following equipment at home: Walker - 2 wheeled  OCCUPATION: Not currently working   PRECAUTIONS: None ---------------------------------------------------------------------------------------------  SUBJECTIVE:                                                                                                                                                                                           SUBJECTIVE STATEMENT: Patient reports compliance with HEP, lower overall back pain today.   Eval statement 04/30/2024: Has multilevel lumbar fusion on Aug 4rh, wants to start moving better and walk better.  Pain currently 5/10 Some n/t down LLE  RED FLAGS: None    PLOF: Independent  PATIENT GOALS: walk better  NEXT MD VISIT: October 26th ---------------------------------------------------------------------------------------------  OBJECTIVE:  Note: Objective measures were completed at Evaluation unless otherwise noted.  DIAGNOSTIC FINDINGS:  FINDINGS: Three fluoroscopic spot views of the lumbar spine submitted from the operating room. Previous L4-L5 fusion. Subsequent pedicle screws at L3 and S1 with L3-L4 and L5-S1 interbody spacers. Fluoroscopy time 71.2 seconds. Dose 126.64 mGy.   IMPRESSION: Intraoperative fluoroscopy during lumbar spine surgery.     Electronically Signed   By: Andrea Gasman M.D.   On: 02/17/2024 18:45    PATIENT SURVEYS:  MODI: 19/50  COGNITION: Overall cognitive status: Within functional limits for tasks assessed   PALPATION: Not  TTP   SENSATION: Light touch: Impaired   MUSCLE LENGTH: Hamstrings: Right 20d; deg; Left 20d deg  Thomas test: 1/2 joint tightness  POSTURE: flexed trunk    LUMBAR ROM:   AROM eval  Flexion 40%  Extension 0%  Right lateral flexion   Left lateral flexion   Right rotation   Left rotation    (Blank rows =  not tested)  ! Indicates pain with testing  LOWER EXTREMITY ROM:     Active  Right eval Left eval  Hip flexion    Hip extension    Hip abduction    Hip adduction    Hip internal rotation    Hip external rotation    Knee flexion    Knee extension    Ankle dorsiflexion    Ankle plantarflexion    Ankle inversion    Ankle eversion     (Blank rows = not tested)  ! Indicates pain with testing  LOWER EXTREMITY MMT:    MMT Right eval Left eval  Hip flexion 4 4  Hip extension 4 4  Hip abduction    Hip adduction    Hip internal rotation    Hip external rotation    Knee flexion    Knee extension    Ankle dorsiflexion    Ankle plantarflexion    Ankle inversion    Ankle eversion     (Blank rows = not tested)   ! Indicates pain with testing LUMBAR SPECIAL TESTS:  Unable to tolerate tests  FUNCTIONAL TESTS:  5 STS: 14.57  GAIT: Distance walked: 100' Assistive device utilized: None Level of assistance: Complete Independence Comments:   [facility] Adult PT Treatment:                                                DATE: 05/18/24 Therapeutic Exercise: Nustep level 5 x 5 mins while gathering subjective info and planning session with patient Standing hip abduction/extension x10 ea BIL Mini squats with UE support 2x10 Seated hamstring stretch 2x30 BIL Seated pball roll outs fwd/lat x10 ea STS arms crossed 2x10 Neuromuscular re-ed: Seated pball press downs 2 2x10 Posterior pelvic tilt 2x10 2 sec hold  [facility] Adult PT Treatment:                                                DATE: 05/11/24 Therapeutic Exercise: Nustep level 5 x 6 mins while gathering  subjective info and planning session with patient Standing hip abduction/extension x10 ea BIL Mini squats with UE support 2x10 Seated hamstring stretch 2x30 BIL Seated pball roll outs fwd/lat x10 ea Neuromuscular re-ed: Seated pball press downs 5 2x10  [facility] Adult PT Treatment:                                                DATE: 04/30/2024  Self Care: Pt education POC discussion  PATIENT EDUCATION:  Education details: Pt received education regarding HEP performance, ADL performance, functional activity tolerance, impairment education, appropriate performance of therapeutic activities.  Person educated: Patient Education method: Explanation, Demonstration, Tactile cues, Verbal cues, and Handouts Education comprehension: verbalized understanding and returned demonstration  HOME EXERCISE PROGRAM: Access Code: [email or web address] URL: [email or web address] Date: 04/30/2024 Prepared by: Mabel Kiang  Exercises - Mini Squat with Counter Support  - 1 x daily - 4 x weekly - 3 sets - 10 reps - 2s hold - Bent Knee Fallouts  - 1 x daily - 4 x weekly - 3 sets - 12 reps - 2s hold - Abdominal Bracing  - 1 x daily - 4 x weekly - 2 sets - 10 reps - 6s hold ---------------------------------------------------------------------------------------------  ASSESSMENT:  CLINICAL IMPRESSION: Patient presents to PT reporting lower pain in his back today, has been compliant with his HEP. Session today focused on core and proximal hip strengthening. Supine was a painful position, will avoid in future session. Patient was able to tolerate all prescribed exercises with no adverse effects. Patient continues to benefit from skilled PT services and should be progressed as able to improve functional independence.   Eval impression (04/30/2024): Pt. attended today's physical therapy  session for evaluation of LBP. Pt has complaints of functional deconditioning and mobility following multilevel lumbar fusion on August 8th, 2025. Pt has notable deficits and would benefit from therapeutic focus on lumbar AROM, Proximal hip strength, dynamic core activation patterns, and hip hinge mechanics.  Treatment performed today focused on pt education detailed in the objective. Pt demonstrated great understanding of education provided. required minimal v/t cues and no assistance for appropriate performance with today's activities. Pt requires the intervention of skilled outpatient physical therapy to address the aforementioned deficits and progress towards a functional level in line with therapeutic goals.    OBJECTIVE IMPAIRMENTS: Abnormal gait, decreased activity tolerance, decreased mobility, difficulty walking, decreased ROM, decreased strength, hypomobility, improper body mechanics, postural dysfunction, and pain.   ACTIVITY LIMITATIONS: carrying, lifting, bending, standing, squatting, transfers, bed mobility, and locomotion level  PARTICIPATION LIMITATIONS: meal prep, cleaning, laundry, interpersonal relationship, driving, shopping, and community activity  PERSONAL FACTORS: Age, Fitness, Past/current experiences, and Time since onset of injury/illness/exacerbation are also affecting patient's functional outcome.   REHAB POTENTIAL: Fair see personal factors  CLINICAL DECISION MAKING: Stable/uncomplicated  EVALUATION COMPLEXITY: Low   GOALS: Goals reviewed with patient? YES  SHORT TERM GOALS: Target date: 05/28/2024  Pt will be independent with administered HEP to demonstrate the competency necessary for long term managemnet of symptoms at home. Baseline: Goal status: INITIAL   LONG TERM GOALS: Target date: 06/25/2024  Pt. Will achieve a MODI score of 13/50 as to demonstrate improvement in self-perceived functional ability with daily activities.  Baseline: 19/50(38%) Goal  status: INITIAL  2.  Pt will improve Global hip strength to a 4+/5 to demonstrate improvement in strength for quality of motion and activity performance.  Baseline:  Goal status: INITIAL  3.  Pt will improve Thoracolumbar AROM to 60% of standardized norms with less than 3/10 pain to demonstrate necessary mobility for high quality and safe ADLs  Baseline: 5/10 Goal status: INITIAL  4.   Pt will independently ambulate 1000' with no AD and less than 3/10 pain to demonstrate improved weightbearing tolerance, BLE strength, and functional capacity for community ambulation.  Baseline:  Goal status: INITIAL  5.  Pt will demonstrate x5 squat to parallel using appropriate hip hinge technique necessary for ADL performance and compensatory  functional motion in place of lumbar mobility. Baseline:  Goal status: INITIAL  ---------------------------------------------------------------------------------------------  PLAN:  PT FREQUENCY: 2x/week  PT DURATION: 8 weeks  PLANNED INTERVENTIONS: 97110-Therapeutic exercises, 97530- Therapeutic activity, 97112- Neuromuscular re-education, 97535- Self Care, [id number]- Manual therapy, [phone]- Gait training, [phone] (1-2 muscles), [phone] (3+ muscles)- Dry Needling, Patient/Family education, Balance training, Stair training, Taping, Joint mobilization, Spinal mobilization, DME instructions, Cryotherapy, and Moist heat.  PLAN FOR NEXT SESSION: Review HEP, Begin POC as detailed in assessment   Corean Pouch PTA  05/18/2024, 12:15 PM

## 2024-05-22 NOTE — Therapy (Deleted)
 OUTPATIENT PHYSICAL THERAPY TREATMENT NOTE   Patient Name: Robert [name] MRN: [id number] DOB:1955/10/05, 68 y.o., male Today's Date: 05/22/2024  END OF SESSION:    Past Medical History:  Diagnosis Date   Allergic rhinitis due to pollen    Arthritis    Asthma    Back pain    Barrett esophagus    Depressive disorder    Diabetes mellitus type 2 in obese    Exposure to hepatitis B    Exposure to hepatitis C    Narcotic abuse (HCC)    pain medications   OSA (obstructive sleep apnea)    on CPAP    Past Surgical History:  Procedure Laterality Date   ABDOMINAL SURGERY     AMPUTATION Right 06/11/2018   Procedure: RIGHT 2ND RAY AMPUTATION;  Surgeon: Harden Jerona GAILS, MD;  Location: [facility] OR;  Service: Orthopedics;  Laterality: Right;   AMPUTATION TOE Left 06/02/2022   Procedure: LEFT FOURTH PARTIAL TOE AMPUTATION;  Surgeon: Silva Juliene SAUNDERS, DPM;  Location: [facility];  Service: Podiatry;  Laterality: Left;   AMPUTATION TOE Right 04/11/2023   Procedure: AMPUTATION TOE;  Surgeon: Malvin Marsa FALCON, DPM;  Location: [facility] OR;  Service: Orthopedics/Podiatry;  Laterality: Right;  Right 3rd toe amputation   BACK SURGERY     Rods and screws in lumbar area   COLONOSCOPY     ESOPHAGOGASTRODUODENOSCOPY  multiple   HERNIA REPAIR     6   IR INJECT/THERA/INC NEEDLE/CATH/PLC EPI/LUMB/SAC W/IMG  02/04/2024   TOTAL KNEE ARTHROPLASTY  2009   left   Patient Active Problem List   Diagnosis Date Noted   Spinal stenosis of lumbar region 02/17/2024   Low back pain 02/02/2024   Type 2 diabetes mellitus with diabetic peripheral angiopathy without gangrene, without long-term current use of insulin  (HCC) 04/11/2023   Acute osteomyelitis of toe, right (HCC) 04/10/2023   Secondary hypertension 04/10/2023   Type 2 diabetes mellitus (HCC) 06/02/2022   Conjunctivitis due to adenovirus, left eye 09/11/2018   Type 2 diabetes mellitus with foot ulcer (CODE) (HCC) 08/21/2018   Amputation of second toe, right,  traumatic, sequela 08/21/2018   Osteomyelitis of fourth toe of left foot (HCC)    Lower extremity cellulitis 06/08/2018   Hepatitis C antibody test positive 04/16/2018   Osteomyelitis of toe of right foot (HCC) 04/10/2018   Hyperlipidemia 04/08/2018   Recurrent cellulitis of lower extremity 04/07/2018   Chronic neuropathy of both feet-  due to lumbar radiculopathy as well as diabetic neuropathy 03/25/2018   Pancytopenia (HCC) 03/15/2018   DJD (degenerative joint disease) 01/09/2018   Diabetic foot infection (HCC) 09/30/2017   Essential hypertension 09/30/2017   Mixed diabetic hyperlipidemia associated with type 2 diabetes mellitus (HCC) 09/30/2017   Barrett's esophagus 12/07/2016   Chronic venous stasis dermatitis of both lower extremities 05/08/2016   Vitamin D  deficiency 05/08/2016   OSA (obstructive sleep apnea) 04/11/2016   Bilateral lower extremity pitting edema 04/11/2016   Physical deconditioning 04/11/2016   Restrictive lung disease secondary to obesity 04/11/2016   History of tobacco use 04/11/2016   h/o Depressive disorder 04/11/2016   Lumbar radiculopathy 07/21/2012   Chronic pain due to injury 07/21/2012   Morbid obesity (HCC) 02/15/2012   Postlaminectomy syndrome, lumbar region 01/04/2012   h/o Narcotic abuse 01/04/2012   Constipation 09/22/2011   Chronic back pain 09/22/2011    PCP: [facility]  REFERRING PROVIDER: Orlean Suzen Lacks, NP  REFERRING DIAG: Lumbar radiculopathy [M54.16]   Rationale for Evaluation  and Treatment: Rehabilitation  THERAPY DIAG:  No diagnosis found.  PERTINENT HISTORY: DM2,OSA, HTN,   WEIGHT BEARING RESTRICTIONS: No  FALLS:  Has patient fallen in last 6 months? No  LIVING ENVIRONMENT: Lives with: lives with their family Lives in: House/apartment Stairs: Yes: External: 3 steps; can reach both Has following equipment at home: Walker - 2 wheeled  OCCUPATION: Not currently working   PRECAUTIONS:  None ---------------------------------------------------------------------------------------------  SUBJECTIVE:                                                                                                                                                                                           SUBJECTIVE STATEMENT: Patient reports compliance with HEP, lower overall back pain today.   Eval statement 04/30/2024: Has multilevel lumbar fusion on Aug 4rh, wants to start moving better and walk better.  Pain currently 5/10 Some n/t down LLE  RED FLAGS: None    PLOF: Independent  PATIENT GOALS: walk better  NEXT MD VISIT: October 26th ---------------------------------------------------------------------------------------------  OBJECTIVE:  Note: Objective measures were completed at Evaluation unless otherwise noted.  DIAGNOSTIC FINDINGS:  FINDINGS: Three fluoroscopic spot views of the lumbar spine submitted from the operating room. Previous L4-L5 fusion. Subsequent pedicle screws at L3 and S1 with L3-L4 and L5-S1 interbody spacers. Fluoroscopy time 71.2 seconds. Dose 126.64 mGy.   IMPRESSION: Intraoperative fluoroscopy during lumbar spine surgery.     Electronically Signed   By: Andrea Gasman M.D.   On: 02/17/2024 18:45    PATIENT SURVEYS:  MODI: 19/50  COGNITION: Overall cognitive status: Within functional limits for tasks assessed   PALPATION: Not TTP   SENSATION: Light touch: Impaired   MUSCLE LENGTH: Hamstrings: Right 20d; deg; Left 20d deg  Thomas test: 1/2 joint tightness  POSTURE: flexed trunk    LUMBAR ROM:   AROM eval  Flexion 40%  Extension 0%  Right lateral flexion   Left lateral flexion   Right rotation   Left rotation    (Blank rows = not tested)  ! Indicates pain with testing  LOWER EXTREMITY ROM:     Active  Right eval Left eval  Hip flexion    Hip extension    Hip abduction    Hip adduction    Hip internal rotation     Hip external rotation    Knee flexion    Knee extension    Ankle dorsiflexion    Ankle plantarflexion    Ankle inversion    Ankle eversion     (Blank rows = not tested)  ! Indicates pain with testing  LOWER EXTREMITY MMT:    MMT Right eval Left  eval  Hip flexion 4 4  Hip extension 4 4  Hip abduction    Hip adduction    Hip internal rotation    Hip external rotation    Knee flexion    Knee extension    Ankle dorsiflexion    Ankle plantarflexion    Ankle inversion    Ankle eversion     (Blank rows = not tested)   ! Indicates pain with testing LUMBAR SPECIAL TESTS:  Unable to tolerate tests  FUNCTIONAL TESTS:  5 STS: 14.57  GAIT: Distance walked: 100' Assistive device utilized: None Level of assistance: Complete Independence Comments:   [facility] Adult PT Treatment:                                                DATE: 05/18/24 Therapeutic Exercise: Nustep level 5 x 5 mins while gathering subjective info and planning session with patient Standing hip abduction/extension x10 ea BIL Mini squats with UE support 2x10 Seated hamstring stretch 2x30 BIL Seated pball roll outs fwd/lat x10 ea STS arms crossed 2x10 Neuromuscular re-ed: Seated pball press downs 2 2x10 Posterior pelvic tilt 2x10 2 sec hold  [facility] Adult PT Treatment:                                                DATE: 05/11/24 Therapeutic Exercise: Nustep level 5 x 6 mins while gathering subjective info and planning session with patient Standing hip abduction/extension x10 ea BIL Mini squats with UE support 2x10 Seated hamstring stretch 2x30 BIL Seated pball roll outs fwd/lat x10 ea Neuromuscular re-ed: Seated pball press downs 5 2x10  [facility] Adult PT Treatment:                                                DATE: 04/30/2024  Self Care: Pt education POC discussion                                                                                                                               PATIENT  EDUCATION:  Education details: Pt received education regarding HEP performance, ADL performance, functional activity tolerance, impairment education, appropriate performance of therapeutic activities.  Person educated: Patient Education method: Explanation, Demonstration, Tactile cues, Verbal cues, and Handouts Education comprehension: verbalized understanding and returned demonstration  HOME EXERCISE PROGRAM: Access Code: [email or web address] URL: [email or web address] Date: 04/30/2024 Prepared by: Mabel Kiang  Exercises - Mini Squat with Counter Support  - 1 x daily - 4 x weekly - 3 sets - 10 reps - 2s hold - Bent  Knee Fallouts  - 1 x daily - 4 x weekly - 3 sets - 12 reps - 2s hold - Abdominal Bracing  - 1 x daily - 4 x weekly - 2 sets - 10 reps - 6s hold ---------------------------------------------------------------------------------------------  ASSESSMENT:  CLINICAL IMPRESSION: Patient presents to PT reporting lower pain in his back today, has been compliant with his HEP. Session today focused on core and proximal hip strengthening. Supine was a painful position, will avoid in future session. Patient was able to tolerate all prescribed exercises with no adverse effects. Patient continues to benefit from skilled PT services and should be progressed as able to improve functional independence.   Eval impression (04/30/2024): Pt. attended today's physical therapy session for evaluation of LBP. Pt has complaints of functional deconditioning and mobility following multilevel lumbar fusion on August 8th, 2025. Pt has notable deficits and would benefit from therapeutic focus on lumbar AROM, Proximal hip strength, dynamic core activation patterns, and hip hinge mechanics.  Treatment performed today focused on pt education detailed in the objective. Pt demonstrated great understanding of education provided. required minimal v/t cues and no assistance for appropriate performance with today's  activities. Pt requires the intervention of skilled outpatient physical therapy to address the aforementioned deficits and progress towards a functional level in line with therapeutic goals.    OBJECTIVE IMPAIRMENTS: Abnormal gait, decreased activity tolerance, decreased mobility, difficulty walking, decreased ROM, decreased strength, hypomobility, improper body mechanics, postural dysfunction, and pain.   ACTIVITY LIMITATIONS: carrying, lifting, bending, standing, squatting, transfers, bed mobility, and locomotion level  PARTICIPATION LIMITATIONS: meal prep, cleaning, laundry, interpersonal relationship, driving, shopping, and community activity  PERSONAL FACTORS: Age, Fitness, Past/current experiences, and Time since onset of injury/illness/exacerbation are also affecting patient's functional outcome.   REHAB POTENTIAL: Fair see personal factors  CLINICAL DECISION MAKING: Stable/uncomplicated  EVALUATION COMPLEXITY: Low   GOALS: Goals reviewed with patient? YES  SHORT TERM GOALS: Target date: 05/28/2024  Pt will be independent with administered HEP to demonstrate the competency necessary for long term managemnet of symptoms at home. Baseline: Goal status: INITIAL   LONG TERM GOALS: Target date: 06/25/2024  Pt. Will achieve a MODI score of 13/50 as to demonstrate improvement in self-perceived functional ability with daily activities.  Baseline: 19/50(38%) Goal status: INITIAL  2.  Pt will improve Global hip strength to a 4+/5 to demonstrate improvement in strength for quality of motion and activity performance.  Baseline:  Goal status: INITIAL  3.  Pt will improve Thoracolumbar AROM to 60% of standardized norms with less than 3/10 pain to demonstrate necessary mobility for high quality and safe ADLs  Baseline: 5/10 Goal status: INITIAL  4.   Pt will independently ambulate 1000' with no AD and less than 3/10 pain to demonstrate improved weightbearing tolerance, BLE  strength, and functional capacity for community ambulation.  Baseline:  Goal status: INITIAL  5.  Pt will demonstrate x5 squat to parallel using appropriate hip hinge technique necessary for ADL performance and compensatory functional motion in place of lumbar mobility. Baseline:  Goal status: INITIAL  ---------------------------------------------------------------------------------------------  PLAN:  PT FREQUENCY: 2x/week  PT DURATION: 8 weeks  PLANNED INTERVENTIONS: 97110-Therapeutic exercises, 97530- Therapeutic activity, 97112- Neuromuscular re-education, 97535- Self Care, [id number]- Manual therapy, [phone]- Gait training, [phone] (1-2 muscles), [phone] (3+ muscles)- Dry Needling, Patient/Family education, Balance training, Stair training, Taping, Joint mobilization, Spinal mobilization, DME instructions, Cryotherapy, and Moist heat.  PLAN FOR NEXT SESSION: Review HEP, Begin POC as detailed in assessment   Reyes HERO  Lord Lancour PT  05/22/2024, 8:58 AM

## 2024-05-25 ENCOUNTER — Ambulatory Visit

## 2024-05-29 ENCOUNTER — Other Ambulatory Visit (HOSPITAL_COMMUNITY): Payer: Self-pay

## 2024-05-29 NOTE — Therapy (Deleted)
 OUTPATIENT PHYSICAL THERAPY TREATMENT NOTE   Patient Name: Robert [name] MRN: [id number] DOB:28-Aug-1955, 68 y.o., male Today's Date: 05/29/2024  END OF SESSION:    Past Medical History:  Diagnosis Date   Allergic rhinitis due to pollen    Arthritis    Asthma    Back pain    Barrett esophagus    Depressive disorder    Diabetes mellitus type 2 in obese    Exposure to hepatitis B    Exposure to hepatitis C    Narcotic abuse (HCC)    pain medications   OSA (obstructive sleep apnea)    on CPAP    Past Surgical History:  Procedure Laterality Date   ABDOMINAL SURGERY     AMPUTATION Right 06/11/2018   Procedure: RIGHT 2ND RAY AMPUTATION;  Surgeon: Harden Jerona GAILS, MD;  Location: [facility] OR;  Service: Orthopedics;  Laterality: Right;   AMPUTATION TOE Left 06/02/2022   Procedure: LEFT FOURTH PARTIAL TOE AMPUTATION;  Surgeon: Silva Juliene SAUNDERS, DPM;  Location: [facility];  Service: Podiatry;  Laterality: Left;   AMPUTATION TOE Right 04/11/2023   Procedure: AMPUTATION TOE;  Surgeon: Malvin Marsa FALCON, DPM;  Location: [facility] OR;  Service: Orthopedics/Podiatry;  Laterality: Right;  Right 3rd toe amputation   BACK SURGERY     Rods and screws in lumbar area   COLONOSCOPY     ESOPHAGOGASTRODUODENOSCOPY  multiple   HERNIA REPAIR     6   IR INJECT/THERA/INC NEEDLE/CATH/PLC EPI/LUMB/SAC W/IMG  02/04/2024   TOTAL KNEE ARTHROPLASTY  2009   left   Patient Active Problem List   Diagnosis Date Noted   Spinal stenosis of lumbar region 02/17/2024   Low back pain 02/02/2024   Type 2 diabetes mellitus with diabetic peripheral angiopathy without gangrene, without long-term current use of insulin  (HCC) 04/11/2023   Acute osteomyelitis of toe, right (HCC) 04/10/2023   Secondary hypertension 04/10/2023   Type 2 diabetes mellitus (HCC) 06/02/2022   Conjunctivitis due to adenovirus, left eye 09/11/2018   Type 2 diabetes mellitus with foot ulcer (CODE) (HCC) 08/21/2018   Amputation of second toe, right,  traumatic, sequela 08/21/2018   Osteomyelitis of fourth toe of left foot (HCC)    Lower extremity cellulitis 06/08/2018   Hepatitis C antibody test positive 04/16/2018   Osteomyelitis of toe of right foot (HCC) 04/10/2018   Hyperlipidemia 04/08/2018   Recurrent cellulitis of lower extremity 04/07/2018   Chronic neuropathy of both feet-  due to lumbar radiculopathy as well as diabetic neuropathy 03/25/2018   Pancytopenia (HCC) 03/15/2018   DJD (degenerative joint disease) 01/09/2018   Diabetic foot infection (HCC) 09/30/2017   Essential hypertension 09/30/2017   Mixed diabetic hyperlipidemia associated with type 2 diabetes mellitus (HCC) 09/30/2017   Barrett's esophagus 12/07/2016   Chronic venous stasis dermatitis of both lower extremities 05/08/2016   Vitamin D  deficiency 05/08/2016   OSA (obstructive sleep apnea) 04/11/2016   Bilateral lower extremity pitting edema 04/11/2016   Physical deconditioning 04/11/2016   Restrictive lung disease secondary to obesity 04/11/2016   History of tobacco use 04/11/2016   h/o Depressive disorder 04/11/2016   Lumbar radiculopathy 07/21/2012   Chronic pain due to injury 07/21/2012   Morbid obesity (HCC) 02/15/2012   Postlaminectomy syndrome, lumbar region 01/04/2012   h/o Narcotic abuse 01/04/2012   Constipation 09/22/2011   Chronic back pain 09/22/2011    PCP: [facility]  REFERRING PROVIDER: Orlean Suzen Lacks, NP  REFERRING DIAG: Lumbar radiculopathy [M54.16]   Rationale for Evaluation  and Treatment: Rehabilitation  THERAPY DIAG:  No diagnosis found.  PERTINENT HISTORY: DM2,OSA, HTN,   WEIGHT BEARING RESTRICTIONS: No  FALLS:  Has patient fallen in last 6 months? No  LIVING ENVIRONMENT: Lives with: lives with their family Lives in: House/apartment Stairs: Yes: External: 3 steps; can reach both Has following equipment at home: Walker - 2 wheeled  OCCUPATION: Not currently working   PRECAUTIONS:  None ---------------------------------------------------------------------------------------------  SUBJECTIVE:                                                                                                                                                                                           SUBJECTIVE STATEMENT: Patient reports compliance with HEP, lower overall back pain today.   Eval statement 04/30/2024: Has multilevel lumbar fusion on Aug 4rh, wants to start moving better and walk better.  Pain currently 5/10 Some n/t down LLE  RED FLAGS: None    PLOF: Independent  PATIENT GOALS: walk better  NEXT MD VISIT: October 26th ---------------------------------------------------------------------------------------------  OBJECTIVE:  Note: Objective measures were completed at Evaluation unless otherwise noted.  DIAGNOSTIC FINDINGS:  FINDINGS: Three fluoroscopic spot views of the lumbar spine submitted from the operating room. Previous L4-L5 fusion. Subsequent pedicle screws at L3 and S1 with L3-L4 and L5-S1 interbody spacers. Fluoroscopy time 71.2 seconds. Dose 126.64 mGy.   IMPRESSION: Intraoperative fluoroscopy during lumbar spine surgery.     Electronically Signed   By: Andrea Gasman M.D.   On: 02/17/2024 18:45    PATIENT SURVEYS:  MODI: 19/50  COGNITION: Overall cognitive status: Within functional limits for tasks assessed   PALPATION: Not TTP   SENSATION: Light touch: Impaired   MUSCLE LENGTH: Hamstrings: Right 20d; deg; Left 20d deg  Thomas test: 1/2 joint tightness  POSTURE: flexed trunk    LUMBAR ROM:   AROM eval  Flexion 40%  Extension 0%  Right lateral flexion   Left lateral flexion   Right rotation   Left rotation    (Blank rows = not tested)  ! Indicates pain with testing  LOWER EXTREMITY ROM:     Active  Right eval Left eval  Hip flexion    Hip extension    Hip abduction    Hip adduction    Hip internal rotation     Hip external rotation    Knee flexion    Knee extension    Ankle dorsiflexion    Ankle plantarflexion    Ankle inversion    Ankle eversion     (Blank rows = not tested)  ! Indicates pain with testing  LOWER EXTREMITY MMT:    MMT Right eval Left  eval  Hip flexion 4 4  Hip extension 4 4  Hip abduction    Hip adduction    Hip internal rotation    Hip external rotation    Knee flexion    Knee extension    Ankle dorsiflexion    Ankle plantarflexion    Ankle inversion    Ankle eversion     (Blank rows = not tested)   ! Indicates pain with testing LUMBAR SPECIAL TESTS:  Unable to tolerate tests  FUNCTIONAL TESTS:  5 STS: 14.57  GAIT: Distance walked: 100' Assistive device utilized: None Level of assistance: Complete Independence Comments:   [facility] Adult PT Treatment:                                                DATE: 05/18/24 Therapeutic Exercise: Nustep level 5 x 5 mins while gathering subjective info and planning session with patient Standing hip abduction/extension x10 ea BIL Mini squats with UE support 2x10 Seated hamstring stretch 2x30 BIL Seated pball roll outs fwd/lat x10 ea STS arms crossed 2x10 Neuromuscular re-ed: Seated pball press downs 2 2x10 Posterior pelvic tilt 2x10 2 sec hold  [facility] Adult PT Treatment:                                                DATE: 05/11/24 Therapeutic Exercise: Nustep level 5 x 6 mins while gathering subjective info and planning session with patient Standing hip abduction/extension x10 ea BIL Mini squats with UE support 2x10 Seated hamstring stretch 2x30 BIL Seated pball roll outs fwd/lat x10 ea Neuromuscular re-ed: Seated pball press downs 5 2x10  [facility] Adult PT Treatment:                                                DATE: 04/30/2024  Self Care: Pt education POC discussion                                                                                                                               PATIENT  EDUCATION:  Education details: Pt received education regarding HEP performance, ADL performance, functional activity tolerance, impairment education, appropriate performance of therapeutic activities.  Person educated: Patient Education method: Explanation, Demonstration, Tactile cues, Verbal cues, and Handouts Education comprehension: verbalized understanding and returned demonstration  HOME EXERCISE PROGRAM: Access Code: [email or web address] URL: [email or web address] Date: 04/30/2024 Prepared by: Mabel Kiang  Exercises - Mini Squat with Counter Support  - 1 x daily - 4 x weekly - 3 sets - 10 reps - 2s hold - Bent  Knee Fallouts  - 1 x daily - 4 x weekly - 3 sets - 12 reps - 2s hold - Abdominal Bracing  - 1 x daily - 4 x weekly - 2 sets - 10 reps - 6s hold ---------------------------------------------------------------------------------------------  ASSESSMENT:  CLINICAL IMPRESSION: Patient presents to PT reporting lower pain in his back today, has been compliant with his HEP. Session today focused on core and proximal hip strengthening. Supine was a painful position, will avoid in future session. Patient was able to tolerate all prescribed exercises with no adverse effects. Patient continues to benefit from skilled PT services and should be progressed as able to improve functional independence.   Eval impression (04/30/2024): Pt. attended today's physical therapy session for evaluation of LBP. Pt has complaints of functional deconditioning and mobility following multilevel lumbar fusion on August 8th, 2025. Pt has notable deficits and would benefit from therapeutic focus on lumbar AROM, Proximal hip strength, dynamic core activation patterns, and hip hinge mechanics.  Treatment performed today focused on pt education detailed in the objective. Pt demonstrated great understanding of education provided. required minimal v/t cues and no assistance for appropriate performance with today's  activities. Pt requires the intervention of skilled outpatient physical therapy to address the aforementioned deficits and progress towards a functional level in line with therapeutic goals.    OBJECTIVE IMPAIRMENTS: Abnormal gait, decreased activity tolerance, decreased mobility, difficulty walking, decreased ROM, decreased strength, hypomobility, improper body mechanics, postural dysfunction, and pain.   ACTIVITY LIMITATIONS: carrying, lifting, bending, standing, squatting, transfers, bed mobility, and locomotion level  PARTICIPATION LIMITATIONS: meal prep, cleaning, laundry, interpersonal relationship, driving, shopping, and community activity  PERSONAL FACTORS: Age, Fitness, Past/current experiences, and Time since onset of injury/illness/exacerbation are also affecting patient's functional outcome.   REHAB POTENTIAL: Fair see personal factors  CLINICAL DECISION MAKING: Stable/uncomplicated  EVALUATION COMPLEXITY: Low   GOALS: Goals reviewed with patient? YES  SHORT TERM GOALS: Target date: 05/28/2024  Pt will be independent with administered HEP to demonstrate the competency necessary for long term managemnet of symptoms at home. Baseline: Goal status: INITIAL   LONG TERM GOALS: Target date: 06/25/2024  Pt. Will achieve a MODI score of 13/50 as to demonstrate improvement in self-perceived functional ability with daily activities.  Baseline: 19/50(38%) Goal status: INITIAL  2.  Pt will improve Global hip strength to a 4+/5 to demonstrate improvement in strength for quality of motion and activity performance.  Baseline:  Goal status: INITIAL  3.  Pt will improve Thoracolumbar AROM to 60% of standardized norms with less than 3/10 pain to demonstrate necessary mobility for high quality and safe ADLs  Baseline: 5/10 Goal status: INITIAL  4.   Pt will independently ambulate 1000' with no AD and less than 3/10 pain to demonstrate improved weightbearing tolerance, BLE  strength, and functional capacity for community ambulation.  Baseline:  Goal status: INITIAL  5.  Pt will demonstrate x5 squat to parallel using appropriate hip hinge technique necessary for ADL performance and compensatory functional motion in place of lumbar mobility. Baseline:  Goal status: INITIAL  ---------------------------------------------------------------------------------------------  PLAN:  PT FREQUENCY: 2x/week  PT DURATION: 8 weeks  PLANNED INTERVENTIONS: 97110-Therapeutic exercises, 97530- Therapeutic activity, 97112- Neuromuscular re-education, 97535- Self Care, [id number]- Manual therapy, [phone]- Gait training, [phone] (1-2 muscles), [phone] (3+ muscles)- Dry Needling, Patient/Family education, Balance training, Stair training, Taping, Joint mobilization, Spinal mobilization, DME instructions, Cryotherapy, and Moist heat.  PLAN FOR NEXT SESSION: Review HEP, Begin POC as detailed in assessment   Reyes HERO  Dayveon Halley PT  05/29/2024, 1:26 PM

## 2024-06-01 ENCOUNTER — Ambulatory Visit

## 2024-06-08 ENCOUNTER — Ambulatory Visit

## 2024-06-08 DIAGNOSIS — M5459 Other low back pain: Secondary | ICD-10-CM

## 2024-06-08 DIAGNOSIS — M6281 Muscle weakness (generalized): Secondary | ICD-10-CM

## 2024-06-08 DIAGNOSIS — R2689 Other abnormalities of gait and mobility: Secondary | ICD-10-CM

## 2024-06-08 NOTE — Therapy (Signed)
 OUTPATIENT PHYSICAL THERAPY TREATMENT NOTE   Patient Name: Robert [name] MRN: [id number] DOB:06-Oct-1955, 68 y.o., male Today's Date: 06/08/2024  END OF SESSION:  PT End of Session - 06/08/24 1042     Visit Number 4    Number of Visits 17    Date for Recertification  06/25/24    Authorization Type [employer] MCR    Authorization Time Period 6 visits 04/30/24-06/25/24    Authorization - Visit Number 4    Authorization - Number of Visits 6    PT Start Time 1045    PT Stop Time 1125    PT Time Calculation (min) 40 min    Activity Tolerance Patient tolerated treatment well    Behavior During Therapy WFL for tasks assessed/performed           Past Medical History:  Diagnosis Date   Allergic rhinitis due to pollen    Arthritis    Asthma    Back pain    Barrett esophagus    Depressive disorder    Diabetes mellitus type 2 in obese    Exposure to hepatitis B    Exposure to hepatitis C    Narcotic abuse (HCC)    pain medications   OSA (obstructive sleep apnea)    on CPAP    Past Surgical History:  Procedure Laterality Date   ABDOMINAL SURGERY     AMPUTATION Right 06/11/2018   Procedure: RIGHT 2ND RAY AMPUTATION;  Surgeon: Harden Jerona GAILS, MD;  Location: [facility] OR;  Service: Orthopedics;  Laterality: Right;   AMPUTATION TOE Left 06/02/2022   Procedure: LEFT FOURTH PARTIAL TOE AMPUTATION;  Surgeon: Silva Juliene SAUNDERS, DPM;  Location: [facility];  Service: Podiatry;  Laterality: Left;   AMPUTATION TOE Right 04/11/2023   Procedure: AMPUTATION TOE;  Surgeon: Malvin Marsa FALCON, DPM;  Location: [facility] OR;  Service: Orthopedics/Podiatry;  Laterality: Right;  Right 3rd toe amputation   BACK SURGERY     Rods and screws in lumbar area   COLONOSCOPY     ESOPHAGOGASTRODUODENOSCOPY  multiple   HERNIA REPAIR     6   IR INJECT/THERA/INC NEEDLE/CATH/PLC EPI/LUMB/SAC W/IMG  02/04/2024   TOTAL KNEE ARTHROPLASTY  2009   left   Patient Active Problem List   Diagnosis Date Noted   Spinal stenosis of  lumbar region 02/17/2024   Low back pain 02/02/2024   Type 2 diabetes mellitus with diabetic peripheral angiopathy without gangrene, without long-term current use of insulin  (HCC) 04/11/2023   Acute osteomyelitis of toe, right (HCC) 04/10/2023   Secondary hypertension 04/10/2023   Type 2 diabetes mellitus (HCC) 06/02/2022   Conjunctivitis due to adenovirus, left eye 09/11/2018   Type 2 diabetes mellitus with foot ulcer (CODE) (HCC) 08/21/2018   Amputation of second toe, right, traumatic, sequela 08/21/2018   Osteomyelitis of fourth toe of left foot (HCC)    Lower extremity cellulitis 06/08/2018   Hepatitis C antibody test positive 04/16/2018   Osteomyelitis of toe of right foot (HCC) 04/10/2018   Hyperlipidemia 04/08/2018   Recurrent cellulitis of lower extremity 04/07/2018   Chronic neuropathy of both feet-  due to lumbar radiculopathy as well as diabetic neuropathy 03/25/2018   Pancytopenia (HCC) 03/15/2018   DJD (degenerative joint disease) 01/09/2018   Diabetic foot infection (HCC) 09/30/2017   Essential hypertension 09/30/2017   Mixed diabetic hyperlipidemia associated with type 2 diabetes mellitus (HCC) 09/30/2017   Barrett's esophagus 12/07/2016   Chronic venous stasis dermatitis of both lower extremities 05/08/2016   Vitamin D   deficiency 05/08/2016   OSA (obstructive sleep apnea) 04/11/2016   Bilateral lower extremity pitting edema 04/11/2016   Physical deconditioning 04/11/2016   Restrictive lung disease secondary to obesity 04/11/2016   History of tobacco use 04/11/2016   h/o Depressive disorder 04/11/2016   Lumbar radiculopathy 07/21/2012   Chronic pain due to injury 07/21/2012   Morbid obesity (HCC) 02/15/2012   Postlaminectomy syndrome, lumbar region 01/04/2012   h/o Narcotic abuse 01/04/2012   Constipation 09/22/2011   Chronic back pain 09/22/2011    PCP: [facility]  REFERRING PROVIDER: Orlean Suzen Lacks, NP  REFERRING DIAG: Lumbar  radiculopathy [M54.16]   Rationale for Evaluation and Treatment: Rehabilitation  THERAPY DIAG:  Other low back pain  Other abnormalities of gait and mobility  Muscle weakness (generalized)  PERTINENT HISTORY: DM2,OSA, HTN,   WEIGHT BEARING RESTRICTIONS: No  FALLS:  Has patient fallen in last 6 months? No  LIVING ENVIRONMENT: Lives with: lives with their family Lives in: House/apartment Stairs: Yes: External: 3 steps; can reach both Has following equipment at home: Walker - 2 wheeled  OCCUPATION: Not currently working   PRECAUTIONS: None ---------------------------------------------------------------------------------------------  SUBJECTIVE:                                                                                                                                                                                           SUBJECTIVE STATEMENT:  Continued low back pain/ache.  Feels he is moving better and has more mobility in his hips.  Has been sick over the past 3 weeks.   Eval statement 04/30/2024: Has multilevel lumbar fusion on Aug 4rh, wants to start moving better and walk better.  Pain currently 5/10 Some n/t down LLE  RED FLAGS: None    PLOF: Independent  PATIENT GOALS: walk better  NEXT MD VISIT: October 26th ---------------------------------------------------------------------------------------------  OBJECTIVE:  Note: Objective measures were completed at Evaluation unless otherwise noted.  DIAGNOSTIC FINDINGS:  FINDINGS: Three fluoroscopic spot views of the lumbar spine submitted from the operating room. Previous L4-L5 fusion. Subsequent pedicle screws at L3 and S1 with L3-L4 and L5-S1 interbody spacers. Fluoroscopy time 71.2 seconds. Dose 126.64 mGy.   IMPRESSION: Intraoperative fluoroscopy during lumbar spine surgery.     Electronically Signed   By: Andrea Gasman M.D.   On: 02/17/2024 18:45    PATIENT SURVEYS:  MODI:  19/50  COGNITION: Overall cognitive status: Within functional limits for tasks assessed   PALPATION: Not TTP   SENSATION: Light touch: Impaired   MUSCLE LENGTH: Hamstrings: Right 20d; deg; Left 20d deg  Thomas test: 1/2 joint tightness  POSTURE: flexed trunk    LUMBAR ROM:  AROM eval  Flexion 40%  Extension 0%  Right lateral flexion   Left lateral flexion   Right rotation   Left rotation    (Blank rows = not tested)  ! Indicates pain with testing  LOWER EXTREMITY ROM:     Active  Right eval Left eval  Hip flexion    Hip extension    Hip abduction    Hip adduction    Hip internal rotation    Hip external rotation    Knee flexion    Knee extension    Ankle dorsiflexion    Ankle plantarflexion    Ankle inversion    Ankle eversion     (Blank rows = not tested)  ! Indicates pain with testing  LOWER EXTREMITY MMT:    MMT Right eval Left eval  Hip flexion 4 4  Hip extension 4 4  Hip abduction    Hip adduction    Hip internal rotation    Hip external rotation    Knee flexion    Knee extension    Ankle dorsiflexion    Ankle plantarflexion    Ankle inversion    Ankle eversion     (Blank rows = not tested)   ! Indicates pain with testing LUMBAR SPECIAL TESTS:  Unable to tolerate tests  FUNCTIONAL TESTS:  5 STS: 14.57  GAIT: Distance walked: 100' Assistive device utilized: None Level of assistance: Complete Independence Comments:   [facility] Adult PT Treatment:                                                DATE: 06/08/24 Therapeutic Exercise: Nustep L4 8 min (focus on varus correction) Neuromuscular re-ed: Supine hip fallouts GTB 15x B, 15/15 Bridge against GTB 15x Therapeutic Activity: Seated hamstring stretch 30s x2 B P-ball curl ups 15x B, 15/15 STS 10x arms extended  [facility] Adult PT Treatment:                                                DATE: 05/18/24 Therapeutic Exercise: Nustep level 5 x 5 mins while gathering subjective info and  planning session with patient Standing hip abduction/extension x10 ea BIL Mini squats with UE support 2x10 Seated hamstring stretch 2x30 BIL Seated pball roll outs fwd/lat x10 ea STS arms crossed 2x10 Neuromuscular re-ed: Seated pball press downs 2 2x10 Posterior pelvic tilt 2x10 2 sec hold  [facility] Adult PT Treatment:                                                DATE: 05/11/24 Therapeutic Exercise: Nustep level 5 x 6 mins while gathering subjective info and planning session with patient Standing hip abduction/extension x10 ea BIL Mini squats with UE support 2x10 Seated hamstring stretch 2x30 BIL Seated pball roll outs fwd/lat x10 ea Neuromuscular re-ed: Seated pball press downs 5 2x10  [facility] Adult PT Treatment:  DATE: 04/30/2024  Self Care: Pt education POC discussion                                                                                                                               PATIENT EDUCATION:  Education details: Pt received education regarding HEP performance, ADL performance, functional activity tolerance, impairment education, appropriate performance of therapeutic activities.  Person educated: Patient Education method: Explanation, Demonstration, Tactile cues, Verbal cues, and Handouts Education comprehension: verbalized understanding and returned demonstration  HOME EXERCISE PROGRAM: Access Code: [email or web address] URL: [email or web address] Date: 06/08/2024 Prepared by: Reyes Kohut  Exercises - Bent Knee Fallouts  - 1 x daily - 4 x weekly - 3 sets - 12 reps - 2s hold - Abdominal Bracing  - 1 x daily - 4 x weekly - 2 sets - 10 reps - 6s hold - Seated Table Hamstring Stretch  - 2 x daily - 5 x weekly - 1 sets - 2 reps - 30s hold - Sit to Stand Without Arm Support  - 2 x daily - 5 x weekly - 1 sets - 10  reps ---------------------------------------------------------------------------------------------  ASSESSMENT:  CLINICAL IMPRESSION:  Focus of today's session was aerobic w/u followed by LE stretching and trunk/core strengthening.  HEP reviewed and updated as noted.  Emphasis on body mechanics, hip mobility and lumbosacral strengthening   Eval impression (04/30/2024): Pt. attended today's physical therapy session for evaluation of LBP. Pt has complaints of functional deconditioning and mobility following multilevel lumbar fusion on August 8th, 2025. Pt has notable deficits and would benefit from therapeutic focus on lumbar AROM, Proximal hip strength, dynamic core activation patterns, and hip hinge mechanics.  Treatment performed today focused on pt education detailed in the objective. Pt demonstrated great understanding of education provided. required minimal v/t cues and no assistance for appropriate performance with today's activities. Pt requires the intervention of skilled outpatient physical therapy to address the aforementioned deficits and progress towards a functional level in line with therapeutic goals.    OBJECTIVE IMPAIRMENTS: Abnormal gait, decreased activity tolerance, decreased mobility, difficulty walking, decreased ROM, decreased strength, hypomobility, improper body mechanics, postural dysfunction, and pain.   ACTIVITY LIMITATIONS: carrying, lifting, bending, standing, squatting, transfers, bed mobility, and locomotion level  PARTICIPATION LIMITATIONS: meal prep, cleaning, laundry, interpersonal relationship, driving, shopping, and community activity  PERSONAL FACTORS: Age, Fitness, Past/current experiences, and Time since onset of injury/illness/exacerbation are also affecting patient's functional outcome.   REHAB POTENTIAL: Fair see personal factors  CLINICAL DECISION MAKING: Stable/uncomplicated  EVALUATION COMPLEXITY: Low   GOALS: Goals reviewed with patient?  YES  SHORT TERM GOALS: Target date: 05/28/2024  Pt will be independent with administered HEP to demonstrate the competency necessary for long term managemnet of symptoms at home. Baseline: Goal status: INITIAL   LONG TERM GOALS: Target date: 06/25/2024  Pt. Will achieve a MODI score of 13/50 as to demonstrate improvement in self-perceived functional ability with daily activities.  Baseline: 19/50(38%) Goal status:  INITIAL  2.  Pt will improve Global hip strength to a 4+/5 to demonstrate improvement in strength for quality of motion and activity performance.  Baseline:  Goal status: INITIAL  3.  Pt will improve Thoracolumbar AROM to 60% of standardized norms with less than 3/10 pain to demonstrate necessary mobility for high quality and safe ADLs  Baseline: 5/10 Goal status: INITIAL  4.   Pt will independently ambulate 1000' with no AD and less than 3/10 pain to demonstrate improved weightbearing tolerance, BLE strength, and functional capacity for community ambulation.  Baseline:  Goal status: INITIAL  5.  Pt will demonstrate x5 squat to parallel using appropriate hip hinge technique necessary for ADL performance and compensatory functional motion in place of lumbar mobility. Baseline:  Goal status: INITIAL  ---------------------------------------------------------------------------------------------  PLAN:  PT FREQUENCY: 2x/week  PT DURATION: 8 weeks  PLANNED INTERVENTIONS: 97110-Therapeutic exercises, 97530- Therapeutic activity, 97112- Neuromuscular re-education, 97535- Self Care, [id number]- Manual therapy, [phone]- Gait training, [phone] (1-2 muscles), [phone] (3+ muscles)- Dry Needling, Patient/Family education, Balance training, Stair training, Taping, Joint mobilization, Spinal mobilization, DME instructions, Cryotherapy, and Moist heat.  PLAN FOR NEXT SESSION: Review HEP, Begin POC as detailed in assessment   Reyes CHRISTELLA Kohut PT  06/08/2024, 11:19 AM

## 2024-06-19 NOTE — Therapy (Deleted)
 OUTPATIENT PHYSICAL THERAPY TREATMENT NOTE   Patient Name: Robert [name] MRN: [id number] DOB:September 18, 1955, 68 y.o., male Today's Date: 06/19/2024  END OF SESSION:     Past Medical History:  Diagnosis Date   Allergic rhinitis due to pollen    Arthritis    Asthma    Back pain    Barrett esophagus    Depressive disorder    Diabetes mellitus type 2 in obese    Exposure to hepatitis B    Exposure to hepatitis C    Narcotic abuse (HCC)    pain medications   OSA (obstructive sleep apnea)    on CPAP    Past Surgical History:  Procedure Laterality Date   ABDOMINAL SURGERY     AMPUTATION Right 06/11/2018   Procedure: RIGHT 2ND RAY AMPUTATION;  Surgeon: Harden Jerona GAILS, MD;  Location: [facility] OR;  Service: Orthopedics;  Laterality: Right;   AMPUTATION TOE Left 06/02/2022   Procedure: LEFT FOURTH PARTIAL TOE AMPUTATION;  Surgeon: Silva Juliene SAUNDERS, DPM;  Location: [facility];  Service: Podiatry;  Laterality: Left;   AMPUTATION TOE Right 04/11/2023   Procedure: AMPUTATION TOE;  Surgeon: Malvin Marsa FALCON, DPM;  Location: [facility] OR;  Service: Orthopedics/Podiatry;  Laterality: Right;  Right 3rd toe amputation   BACK SURGERY     Rods and screws in lumbar area   COLONOSCOPY     ESOPHAGOGASTRODUODENOSCOPY  multiple   HERNIA REPAIR     6   IR INJECT/THERA/INC NEEDLE/CATH/PLC EPI/LUMB/SAC W/IMG  02/04/2024   TOTAL KNEE ARTHROPLASTY  2009   left   Patient Active Problem List   Diagnosis Date Noted   Spinal stenosis of lumbar region 02/17/2024   Low back pain 02/02/2024   Type 2 diabetes mellitus with diabetic peripheral angiopathy without gangrene, without long-term current use of insulin  (HCC) 04/11/2023   Acute osteomyelitis of toe, right (HCC) 04/10/2023   Secondary hypertension 04/10/2023   Type 2 diabetes mellitus (HCC) 06/02/2022   Conjunctivitis due to adenovirus, left eye 09/11/2018   Type 2 diabetes mellitus with foot ulcer (CODE) (HCC) 08/21/2018   Amputation of second toe, right,  traumatic, sequela 08/21/2018   Osteomyelitis of fourth toe of left foot (HCC)    Lower extremity cellulitis 06/08/2018   Hepatitis C antibody test positive 04/16/2018   Osteomyelitis of toe of right foot (HCC) 04/10/2018   Hyperlipidemia 04/08/2018   Recurrent cellulitis of lower extremity 04/07/2018   Chronic neuropathy of both feet-  due to lumbar radiculopathy as well as diabetic neuropathy 03/25/2018   Pancytopenia (HCC) 03/15/2018   DJD (degenerative joint disease) 01/09/2018   Diabetic foot infection (HCC) 09/30/2017   Essential hypertension 09/30/2017   Mixed diabetic hyperlipidemia associated with type 2 diabetes mellitus (HCC) 09/30/2017   Barrett's esophagus 12/07/2016   Chronic venous stasis dermatitis of both lower extremities 05/08/2016   Vitamin D  deficiency 05/08/2016   OSA (obstructive sleep apnea) 04/11/2016   Bilateral lower extremity pitting edema 04/11/2016   Physical deconditioning 04/11/2016   Restrictive lung disease secondary to obesity 04/11/2016   History of tobacco use 04/11/2016   h/o Depressive disorder 04/11/2016   Lumbar radiculopathy 07/21/2012   Chronic pain due to injury 07/21/2012   Morbid obesity (HCC) 02/15/2012   Postlaminectomy syndrome, lumbar region 01/04/2012   h/o Narcotic abuse 01/04/2012   Constipation 09/22/2011   Chronic back pain 09/22/2011    PCP: [facility]  REFERRING PROVIDER: Orlean Suzen Lacks, NP  REFERRING DIAG: Lumbar radiculopathy [M54.16]   Rationale for  Evaluation and Treatment: Rehabilitation  THERAPY DIAG:  No diagnosis found.  PERTINENT HISTORY: DM2,OSA, HTN,   WEIGHT BEARING RESTRICTIONS: No  FALLS:  Has patient fallen in last 6 months? No  LIVING ENVIRONMENT: Lives with: lives with their family Lives in: House/apartment Stairs: Yes: External: 3 steps; can reach both Has following equipment at home: Walker - 2 wheeled  OCCUPATION: Not currently working   PRECAUTIONS:  None ---------------------------------------------------------------------------------------------  SUBJECTIVE:                                                                                                                                                                                           SUBJECTIVE STATEMENT:  Continued low back pain/ache.  Feels he is moving better and has more mobility in his hips.  Has been sick over the past 3 weeks.   Eval statement 04/30/2024: Has multilevel lumbar fusion on Aug 4rh, wants to start moving better and walk better.  Pain currently 5/10 Some n/t down LLE  RED FLAGS: None    PLOF: Independent  PATIENT GOALS: walk better  NEXT MD VISIT: October 26th ---------------------------------------------------------------------------------------------  OBJECTIVE:  Note: Objective measures were completed at Evaluation unless otherwise noted.  DIAGNOSTIC FINDINGS:  FINDINGS: Three fluoroscopic spot views of the lumbar spine submitted from the operating room. Previous L4-L5 fusion. Subsequent pedicle screws at L3 and S1 with L3-L4 and L5-S1 interbody spacers. Fluoroscopy time 71.2 seconds. Dose 126.64 mGy.   IMPRESSION: Intraoperative fluoroscopy during lumbar spine surgery.     Electronically Signed   By: Andrea Gasman M.D.   On: 02/17/2024 18:45    PATIENT SURVEYS:  MODI: 19/50  COGNITION: Overall cognitive status: Within functional limits for tasks assessed   PALPATION: Not TTP   SENSATION: Light touch: Impaired   MUSCLE LENGTH: Hamstrings: Right 20d; deg; Left 20d deg  Thomas test: 1/2 joint tightness  POSTURE: flexed trunk    LUMBAR ROM:   AROM eval  Flexion 40%  Extension 0%  Right lateral flexion   Left lateral flexion   Right rotation   Left rotation    (Blank rows = not tested)  ! Indicates pain with testing  LOWER EXTREMITY ROM:     Active  Right eval Left eval  Hip flexion    Hip extension     Hip abduction    Hip adduction    Hip internal rotation    Hip external rotation    Knee flexion    Knee extension    Ankle dorsiflexion    Ankle plantarflexion    Ankle inversion    Ankle eversion     (Blank rows = not  tested)  ! Indicates pain with testing  LOWER EXTREMITY MMT:    MMT Right eval Left eval  Hip flexion 4 4  Hip extension 4 4  Hip abduction    Hip adduction    Hip internal rotation    Hip external rotation    Knee flexion    Knee extension    Ankle dorsiflexion    Ankle plantarflexion    Ankle inversion    Ankle eversion     (Blank rows = not tested)   ! Indicates pain with testing LUMBAR SPECIAL TESTS:  Unable to tolerate tests  FUNCTIONAL TESTS:  5 STS: 14.57  GAIT: Distance walked: 100' Assistive device utilized: None Level of assistance: Complete Independence Comments:   [facility] Adult PT Treatment:                                                DATE: 06/08/24 Therapeutic Exercise: Nustep L4 8 min (focus on varus correction) Neuromuscular re-ed: Supine hip fallouts GTB 15x B, 15/15 Bridge against GTB 15x Therapeutic Activity: Seated hamstring stretch 30s x2 B P-ball curl ups 15x B, 15/15 STS 10x arms extended  [facility] Adult PT Treatment:                                                DATE: 05/18/24 Therapeutic Exercise: Nustep level 5 x 5 mins while gathering subjective info and planning session with patient Standing hip abduction/extension x10 ea BIL Mini squats with UE support 2x10 Seated hamstring stretch 2x30 BIL Seated pball roll outs fwd/lat x10 ea STS arms crossed 2x10 Neuromuscular re-ed: Seated pball press downs 2 2x10 Posterior pelvic tilt 2x10 2 sec hold  [facility] Adult PT Treatment:                                                DATE: 05/11/24 Therapeutic Exercise: Nustep level 5 x 6 mins while gathering subjective info and planning session with patient Standing hip abduction/extension x10 ea BIL Mini squats with UE  support 2x10 Seated hamstring stretch 2x30 BIL Seated pball roll outs fwd/lat x10 ea Neuromuscular re-ed: Seated pball press downs 5 2x10  [facility] Adult PT Treatment:                                                DATE: 04/30/2024  Self Care: Pt education POC discussion  PATIENT EDUCATION:  Education details: Pt received education regarding HEP performance, ADL performance, functional activity tolerance, impairment education, appropriate performance of therapeutic activities.  Person educated: Patient Education method: Explanation, Demonstration, Tactile cues, Verbal cues, and Handouts Education comprehension: verbalized understanding and returned demonstration  HOME EXERCISE PROGRAM: Access Code: [email or web address] URL: [email or web address] Date: 06/08/2024 Prepared by: Reyes Kohut  Exercises - Bent Knee Fallouts  - 1 x daily - 4 x weekly - 3 sets - 12 reps - 2s hold - Abdominal Bracing  - 1 x daily - 4 x weekly - 2 sets - 10 reps - 6s hold - Seated Table Hamstring Stretch  - 2 x daily - 5 x weekly - 1 sets - 2 reps - 30s hold - Sit to Stand Without Arm Support  - 2 x daily - 5 x weekly - 1 sets - 10 reps ---------------------------------------------------------------------------------------------  ASSESSMENT:  CLINICAL IMPRESSION:  Focus of today's session was aerobic w/u followed by LE stretching and trunk/core strengthening.  HEP reviewed and updated as noted.  Emphasis on body mechanics, hip mobility and lumbosacral strengthening   Eval impression (04/30/2024): Pt. attended today's physical therapy session for evaluation of LBP. Pt has complaints of functional deconditioning and mobility following multilevel lumbar fusion on August 8th, 2025. Pt has notable deficits and would benefit from therapeutic focus on lumbar AROM, Proximal hip strength,  dynamic core activation patterns, and hip hinge mechanics.  Treatment performed today focused on pt education detailed in the objective. Pt demonstrated great understanding of education provided. required minimal v/t cues and no assistance for appropriate performance with today's activities. Pt requires the intervention of skilled outpatient physical therapy to address the aforementioned deficits and progress towards a functional level in line with therapeutic goals.    OBJECTIVE IMPAIRMENTS: Abnormal gait, decreased activity tolerance, decreased mobility, difficulty walking, decreased ROM, decreased strength, hypomobility, improper body mechanics, postural dysfunction, and pain.   ACTIVITY LIMITATIONS: carrying, lifting, bending, standing, squatting, transfers, bed mobility, and locomotion level  PARTICIPATION LIMITATIONS: meal prep, cleaning, laundry, interpersonal relationship, driving, shopping, and community activity  PERSONAL FACTORS: Age, Fitness, Past/current experiences, and Time since onset of injury/illness/exacerbation are also affecting patient's functional outcome.   REHAB POTENTIAL: Fair see personal factors  CLINICAL DECISION MAKING: Stable/uncomplicated  EVALUATION COMPLEXITY: Low   GOALS: Goals reviewed with patient? YES  SHORT TERM GOALS: Target date: 05/28/2024  Pt will be independent with administered HEP to demonstrate the competency necessary for long term managemnet of symptoms at home. Baseline: Goal status: INITIAL   LONG TERM GOALS: Target date: 06/25/2024  Pt. Will achieve a MODI score of 13/50 as to demonstrate improvement in self-perceived functional ability with daily activities.  Baseline: 19/50(38%) Goal status: INITIAL  2.  Pt will improve Global hip strength to a 4+/5 to demonstrate improvement in strength for quality of motion and activity performance.  Baseline:  Goal status: INITIAL  3.  Pt will improve Thoracolumbar AROM to 60% of  standardized norms with less than 3/10 pain to demonstrate necessary mobility for high quality and safe ADLs  Baseline: 5/10 Goal status: INITIAL  4.   Pt will independently ambulate 1000' with no AD and less than 3/10 pain to demonstrate improved weightbearing tolerance, BLE strength, and functional capacity for community ambulation.  Baseline:  Goal status: INITIAL  5.  Pt will demonstrate x5 squat to parallel using appropriate hip hinge technique necessary for ADL performance and compensatory functional motion in place of lumbar mobility. Baseline:  Goal status: INITIAL  ---------------------------------------------------------------------------------------------  PLAN:  PT FREQUENCY: 2x/week  PT DURATION: 8 weeks  PLANNED INTERVENTIONS: 97110-Therapeutic exercises, 97530- Therapeutic activity, 97112- Neuromuscular re-education, 97535- Self Care, [id number]- Manual therapy, [phone]- Gait training, [phone] (1-2 muscles), [phone] (3+ muscles)- Dry Needling, Patient/Family education, Balance training, Stair training, Taping, Joint mobilization, Spinal mobilization, DME instructions, Cryotherapy, and Moist heat.  PLAN FOR NEXT SESSION: Review HEP, Begin POC as detailed in assessment   Reyes CHRISTELLA Kohut PT  06/19/2024, 8:59 AM

## 2024-06-22 ENCOUNTER — Ambulatory Visit

## 2024-06-29 ENCOUNTER — Ambulatory Visit

## 2024-06-29 NOTE — Therapy (Deleted)
 OUTPATIENT PHYSICAL THERAPY TREATMENT NOTE   Patient Name: Robert [name] MRN: [id number] DOB:1956/05/26, 68 y.o., male Today's Date: 06/29/2024  END OF SESSION:     Past Medical History:  Diagnosis Date   Allergic rhinitis due to pollen    Arthritis    Asthma    Back pain    Barrett esophagus    Depressive disorder    Diabetes mellitus type 2 in obese    Exposure to hepatitis B    Exposure to hepatitis C    Narcotic abuse (HCC)    pain medications   OSA (obstructive sleep apnea)    on CPAP    Past Surgical History:  Procedure Laterality Date   ABDOMINAL SURGERY     AMPUTATION Right 06/11/2018   Procedure: RIGHT 2ND RAY AMPUTATION;  Surgeon: Harden Jerona GAILS, MD;  Location: [facility] OR;  Service: Orthopedics;  Laterality: Right;   AMPUTATION TOE Left 06/02/2022   Procedure: LEFT FOURTH PARTIAL TOE AMPUTATION;  Surgeon: Silva Juliene SAUNDERS, DPM;  Location: [facility];  Service: Podiatry;  Laterality: Left;   AMPUTATION TOE Right 04/11/2023   Procedure: AMPUTATION TOE;  Surgeon: Malvin Marsa FALCON, DPM;  Location: [facility] OR;  Service: Orthopedics/Podiatry;  Laterality: Right;  Right 3rd toe amputation   BACK SURGERY     Rods and screws in lumbar area   COLONOSCOPY     ESOPHAGOGASTRODUODENOSCOPY  multiple   HERNIA REPAIR     6   IR INJECT/THERA/INC NEEDLE/CATH/PLC EPI/LUMB/SAC W/IMG  02/04/2024   TOTAL KNEE ARTHROPLASTY  2009   left   Patient Active Problem List   Diagnosis Date Noted   Spinal stenosis of lumbar region 02/17/2024   Low back pain 02/02/2024   Type 2 diabetes mellitus with diabetic peripheral angiopathy without gangrene, without long-term current use of insulin  (HCC) 04/11/2023   Acute osteomyelitis of toe, right (HCC) 04/10/2023   Secondary hypertension 04/10/2023   Type 2 diabetes mellitus (HCC) 06/02/2022   Conjunctivitis due to adenovirus, left eye 09/11/2018   Type 2 diabetes mellitus with foot ulcer (CODE) (HCC) 08/21/2018   Amputation of second toe, right,  traumatic, sequela 08/21/2018   Osteomyelitis of fourth toe of left foot (HCC)    Lower extremity cellulitis 06/08/2018   Hepatitis C antibody test positive 04/16/2018   Osteomyelitis of toe of right foot (HCC) 04/10/2018   Hyperlipidemia 04/08/2018   Recurrent cellulitis of lower extremity 04/07/2018   Chronic neuropathy of both feet-  due to lumbar radiculopathy as well as diabetic neuropathy 03/25/2018   Pancytopenia (HCC) 03/15/2018   DJD (degenerative joint disease) 01/09/2018   Diabetic foot infection (HCC) 09/30/2017   Essential hypertension 09/30/2017   Mixed diabetic hyperlipidemia associated with type 2 diabetes mellitus (HCC) 09/30/2017   Barrett's esophagus 12/07/2016   Chronic venous stasis dermatitis of both lower extremities 05/08/2016   Vitamin D  deficiency 05/08/2016   OSA (obstructive sleep apnea) 04/11/2016   Bilateral lower extremity pitting edema 04/11/2016   Physical deconditioning 04/11/2016   Restrictive lung disease secondary to obesity 04/11/2016   History of tobacco use 04/11/2016   h/o Depressive disorder 04/11/2016   Lumbar radiculopathy 07/21/2012   Chronic pain due to injury 07/21/2012   Morbid obesity (HCC) 02/15/2012   Postlaminectomy syndrome, lumbar region 01/04/2012   h/o Narcotic abuse 01/04/2012   Constipation 09/22/2011   Chronic back pain 09/22/2011    PCP: [facility]  REFERRING PROVIDER: Orlean Suzen Lacks, NP  REFERRING DIAG: Lumbar radiculopathy [M54.16]   Rationale for  Evaluation and Treatment: Rehabilitation  THERAPY DIAG:  No diagnosis found.  PERTINENT HISTORY: DM2,OSA, HTN,   WEIGHT BEARING RESTRICTIONS: No  FALLS:  Has patient fallen in last 6 months? No  LIVING ENVIRONMENT: Lives with: lives with their family Lives in: House/apartment Stairs: Yes: External: 3 steps; can reach both Has following equipment at home: Walker - 2 wheeled  OCCUPATION: Not currently working   PRECAUTIONS:  None ---------------------------------------------------------------------------------------------  SUBJECTIVE:                                                                                                                                                                                           SUBJECTIVE STATEMENT:  Continued low back pain/ache.  Feels he is moving better and has more mobility in his hips.  Has been sick over the past 3 weeks.   Eval statement 04/30/2024: Has multilevel lumbar fusion on Aug 4rh, wants to start moving better and walk better.  Pain currently 5/10 Some n/t down LLE  RED FLAGS: None    PLOF: Independent  PATIENT GOALS: walk better  NEXT MD VISIT: October 26th ---------------------------------------------------------------------------------------------  OBJECTIVE:  Note: Objective measures were completed at Evaluation unless otherwise noted.  DIAGNOSTIC FINDINGS:  FINDINGS: Three fluoroscopic spot views of the lumbar spine submitted from the operating room. Previous L4-L5 fusion. Subsequent pedicle screws at L3 and S1 with L3-L4 and L5-S1 interbody spacers. Fluoroscopy time 71.2 seconds. Dose 126.64 mGy.   IMPRESSION: Intraoperative fluoroscopy during lumbar spine surgery.     Electronically Signed   By: Andrea Gasman M.D.   On: 02/17/2024 18:45    PATIENT SURVEYS:  MODI: 19/50  COGNITION: Overall cognitive status: Within functional limits for tasks assessed   PALPATION: Not TTP   SENSATION: Light touch: Impaired   MUSCLE LENGTH: Hamstrings: Right 20d; deg; Left 20d deg  Thomas test: 1/2 joint tightness  POSTURE: flexed trunk    LUMBAR ROM:   AROM eval  Flexion 40%  Extension 0%  Right lateral flexion   Left lateral flexion   Right rotation   Left rotation    (Blank rows = not tested)  ! Indicates pain with testing  LOWER EXTREMITY ROM:     Active  Right eval Left eval  Hip flexion    Hip extension     Hip abduction    Hip adduction    Hip internal rotation    Hip external rotation    Knee flexion    Knee extension    Ankle dorsiflexion    Ankle plantarflexion    Ankle inversion    Ankle eversion     (Blank rows = not  tested)  ! Indicates pain with testing  LOWER EXTREMITY MMT:    MMT Right eval Left eval  Hip flexion 4 4  Hip extension 4 4  Hip abduction    Hip adduction    Hip internal rotation    Hip external rotation    Knee flexion    Knee extension    Ankle dorsiflexion    Ankle plantarflexion    Ankle inversion    Ankle eversion     (Blank rows = not tested)   ! Indicates pain with testing LUMBAR SPECIAL TESTS:  Unable to tolerate tests  FUNCTIONAL TESTS:  5 STS: 14.57  GAIT: Distance walked: 100' Assistive device utilized: None Level of assistance: Complete Independence Comments:   [facility] Adult PT Treatment:                                                DATE: 06/08/24 Therapeutic Exercise: Nustep L4 8 min (focus on varus correction) Neuromuscular re-ed: Supine hip fallouts GTB 15x B, 15/15 Bridge against GTB 15x Therapeutic Activity: Seated hamstring stretch 30s x2 B P-ball curl ups 15x B, 15/15 STS 10x arms extended  [facility] Adult PT Treatment:                                                DATE: 05/18/24 Therapeutic Exercise: Nustep level 5 x 5 mins while gathering subjective info and planning session with patient Standing hip abduction/extension x10 ea BIL Mini squats with UE support 2x10 Seated hamstring stretch 2x30 BIL Seated pball roll outs fwd/lat x10 ea STS arms crossed 2x10 Neuromuscular re-ed: Seated pball press downs 2 2x10 Posterior pelvic tilt 2x10 2 sec hold  [facility] Adult PT Treatment:                                                DATE: 05/11/24 Therapeutic Exercise: Nustep level 5 x 6 mins while gathering subjective info and planning session with patient Standing hip abduction/extension x10 ea BIL Mini squats with UE  support 2x10 Seated hamstring stretch 2x30 BIL Seated pball roll outs fwd/lat x10 ea Neuromuscular re-ed: Seated pball press downs 5 2x10  [facility] Adult PT Treatment:                                                DATE: 04/30/2024  Self Care: Pt education POC discussion  PATIENT EDUCATION:  Education details: Pt received education regarding HEP performance, ADL performance, functional activity tolerance, impairment education, appropriate performance of therapeutic activities.  Person educated: Patient Education method: Explanation, Demonstration, Tactile cues, Verbal cues, and Handouts Education comprehension: verbalized understanding and returned demonstration  HOME EXERCISE PROGRAM: Access Code: [email or web address] URL: [email or web address] Date: 06/08/2024 Prepared by: Reyes Kohut  Exercises - Bent Knee Fallouts  - 1 x daily - 4 x weekly - 3 sets - 12 reps - 2s hold - Abdominal Bracing  - 1 x daily - 4 x weekly - 2 sets - 10 reps - 6s hold - Seated Table Hamstring Stretch  - 2 x daily - 5 x weekly - 1 sets - 2 reps - 30s hold - Sit to Stand Without Arm Support  - 2 x daily - 5 x weekly - 1 sets - 10 reps ---------------------------------------------------------------------------------------------  ASSESSMENT:  CLINICAL IMPRESSION:  Focus of today's session was aerobic w/u followed by LE stretching and trunk/core strengthening.  HEP reviewed and updated as noted.  Emphasis on body mechanics, hip mobility and lumbosacral strengthening   Eval impression (04/30/2024): Pt. attended today's physical therapy session for evaluation of LBP. Pt has complaints of functional deconditioning and mobility following multilevel lumbar fusion on August 8th, 2025. Pt has notable deficits and would benefit from therapeutic focus on lumbar AROM, Proximal hip strength,  dynamic core activation patterns, and hip hinge mechanics.  Treatment performed today focused on pt education detailed in the objective. Pt demonstrated great understanding of education provided. required minimal v/t cues and no assistance for appropriate performance with today's activities. Pt requires the intervention of skilled outpatient physical therapy to address the aforementioned deficits and progress towards a functional level in line with therapeutic goals.    OBJECTIVE IMPAIRMENTS: Abnormal gait, decreased activity tolerance, decreased mobility, difficulty walking, decreased ROM, decreased strength, hypomobility, improper body mechanics, postural dysfunction, and pain.   ACTIVITY LIMITATIONS: carrying, lifting, bending, standing, squatting, transfers, bed mobility, and locomotion level  PARTICIPATION LIMITATIONS: meal prep, cleaning, laundry, interpersonal relationship, driving, shopping, and community activity  PERSONAL FACTORS: Age, Fitness, Past/current experiences, and Time since onset of injury/illness/exacerbation are also affecting patient's functional outcome.   REHAB POTENTIAL: Fair see personal factors  CLINICAL DECISION MAKING: Stable/uncomplicated  EVALUATION COMPLEXITY: Low   GOALS: Goals reviewed with patient? YES  SHORT TERM GOALS: Target date: 05/28/2024  Pt will be independent with administered HEP to demonstrate the competency necessary for long term managemnet of symptoms at home. Baseline: Goal status: INITIAL   LONG TERM GOALS: Target date: 06/25/2024  Pt. Will achieve a MODI score of 13/50 as to demonstrate improvement in self-perceived functional ability with daily activities.  Baseline: 19/50(38%) Goal status: INITIAL  2.  Pt will improve Global hip strength to a 4+/5 to demonstrate improvement in strength for quality of motion and activity performance.  Baseline:  Goal status: INITIAL  3.  Pt will improve Thoracolumbar AROM to 60% of  standardized norms with less than 3/10 pain to demonstrate necessary mobility for high quality and safe ADLs  Baseline: 5/10 Goal status: INITIAL  4.   Pt will independently ambulate 1000' with no AD and less than 3/10 pain to demonstrate improved weightbearing tolerance, BLE strength, and functional capacity for community ambulation.  Baseline:  Goal status: INITIAL  5.  Pt will demonstrate x5 squat to parallel using appropriate hip hinge technique necessary for ADL performance and compensatory functional motion in place of lumbar mobility. Baseline:  Goal status: INITIAL  ---------------------------------------------------------------------------------------------  PLAN:  PT FREQUENCY: 2x/week  PT DURATION: 8 weeks  PLANNED INTERVENTIONS: 97110-Therapeutic exercises, 97530- Therapeutic activity, 97112- Neuromuscular re-education, 97535- Self Care, [id number]- Manual therapy, [phone]- Gait training, [phone] (1-2 muscles), [phone] (3+ muscles)- Dry Needling, Patient/Family education, Balance training, Stair training, Taping, Joint mobilization, Spinal mobilization, DME instructions, Cryotherapy, and Moist heat.  PLAN FOR NEXT SESSION: Review HEP, Begin POC as detailed in assessment   Reyes CHRISTELLA Kohut PT  06/29/2024, 7:46 AM

## 2024-07-13 NOTE — Therapy (Deleted)
 " OUTPATIENT PHYSICAL THERAPY TREATMENT NOTE   Patient Name: Robert [name] MRN: [id number] DOB:26-Oct-1955, 68 y.o., male Today's Date: 07/13/2024  END OF SESSION:     Past Medical History:  Diagnosis Date   Allergic rhinitis due to pollen    Arthritis    Asthma    Back pain    Barrett esophagus    Depressive disorder    Diabetes mellitus type 2 in obese    Exposure to hepatitis B    Exposure to hepatitis C    Narcotic abuse (HCC)    pain medications   OSA (obstructive sleep apnea)    on CPAP    Past Surgical History:  Procedure Laterality Date   ABDOMINAL SURGERY     AMPUTATION Right 06/11/2018   Procedure: RIGHT 2ND RAY AMPUTATION;  Surgeon: Harden Jerona GAILS, MD;  Location: [facility] OR;  Service: Orthopedics;  Laterality: Right;   AMPUTATION TOE Left 06/02/2022   Procedure: LEFT FOURTH PARTIAL TOE AMPUTATION;  Surgeon: Silva Juliene SAUNDERS, DPM;  Location: [facility];  Service: Podiatry;  Laterality: Left;   AMPUTATION TOE Right 04/11/2023   Procedure: AMPUTATION TOE;  Surgeon: Malvin Marsa FALCON, DPM;  Location: [facility] OR;  Service: Orthopedics/Podiatry;  Laterality: Right;  Right 3rd toe amputation   BACK SURGERY     Rods and screws in lumbar area   COLONOSCOPY     ESOPHAGOGASTRODUODENOSCOPY  multiple   HERNIA REPAIR     6   IR INJECT/THERA/INC NEEDLE/CATH/PLC EPI/LUMB/SAC W/IMG  02/04/2024   TOTAL KNEE ARTHROPLASTY  2009   left   Patient Active Problem List   Diagnosis Date Noted   Spinal stenosis of lumbar region 02/17/2024   Low back pain 02/02/2024   Type 2 diabetes mellitus with diabetic peripheral angiopathy without gangrene, without long-term current use of insulin  (HCC) 04/11/2023   Acute osteomyelitis of toe, right (HCC) 04/10/2023   Secondary hypertension 04/10/2023   Type 2 diabetes mellitus (HCC) 06/02/2022   Conjunctivitis due to adenovirus, left eye 09/11/2018   Type 2 diabetes mellitus with foot ulcer (CODE) (HCC) 08/21/2018   Amputation of second toe, right,  traumatic, sequela 08/21/2018   Osteomyelitis of fourth toe of left foot (HCC)    Lower extremity cellulitis 06/08/2018   Hepatitis C antibody test positive 04/16/2018   Osteomyelitis of toe of right foot (HCC) 04/10/2018   Hyperlipidemia 04/08/2018   Recurrent cellulitis of lower extremity 04/07/2018   Chronic neuropathy of both feet-  due to lumbar radiculopathy as well as diabetic neuropathy 03/25/2018   Pancytopenia (HCC) 03/15/2018   DJD (degenerative joint disease) 01/09/2018   Diabetic foot infection (HCC) 09/30/2017   Essential hypertension 09/30/2017   Mixed diabetic hyperlipidemia associated with type 2 diabetes mellitus (HCC) 09/30/2017   Barrett's esophagus 12/07/2016   Chronic venous stasis dermatitis of both lower extremities 05/08/2016   Vitamin D  deficiency 05/08/2016   OSA (obstructive sleep apnea) 04/11/2016   Bilateral lower extremity pitting edema 04/11/2016   Physical deconditioning 04/11/2016   Restrictive lung disease secondary to obesity 04/11/2016   History of tobacco use 04/11/2016   h/o Depressive disorder 04/11/2016   Lumbar radiculopathy 07/21/2012   Chronic pain due to injury 07/21/2012   Morbid obesity (HCC) 02/15/2012   Postlaminectomy syndrome, lumbar region 01/04/2012   h/o Narcotic abuse 01/04/2012   Constipation 09/22/2011   Chronic back pain 09/22/2011    PCP: [facility]  REFERRING PROVIDER: Orlean Suzen Lacks, NP  REFERRING DIAG: Lumbar radiculopathy [M54.16]   Rationale  for Evaluation and Treatment: Rehabilitation  THERAPY DIAG:  No diagnosis found.  PERTINENT HISTORY: DM2,OSA, HTN,   WEIGHT BEARING RESTRICTIONS: No  FALLS:  Has patient fallen in last 6 months? No  LIVING ENVIRONMENT: Lives with: lives with their family Lives in: House/apartment Stairs: Yes: External: 3 steps; can reach both Has following equipment at home: Walker - 2 wheeled  OCCUPATION: Not currently working   PRECAUTIONS:  None ---------------------------------------------------------------------------------------------  SUBJECTIVE:                                                                                                                                                                                           SUBJECTIVE STATEMENT:  Continued low back pain/ache.  Feels he is moving better and has more mobility in his hips.  Has been sick over the past 3 weeks.   Eval statement 04/30/2024: Has multilevel lumbar fusion on Aug 4rh, wants to start moving better and walk better.  Pain currently 5/10 Some n/t down LLE  RED FLAGS: None    PLOF: Independent  PATIENT GOALS: walk better  NEXT MD VISIT: October 26th ---------------------------------------------------------------------------------------------  OBJECTIVE:  Note: Objective measures were completed at Evaluation unless otherwise noted.  DIAGNOSTIC FINDINGS:  FINDINGS: Three fluoroscopic spot views of the lumbar spine submitted from the operating room. Previous L4-L5 fusion. Subsequent pedicle screws at L3 and S1 with L3-L4 and L5-S1 interbody spacers. Fluoroscopy time 71.2 seconds. Dose 126.64 mGy.   IMPRESSION: Intraoperative fluoroscopy during lumbar spine surgery.     Electronically Signed   By: Andrea Gasman M.D.   On: 02/17/2024 18:45    PATIENT SURVEYS:  MODI: 19/50  COGNITION: Overall cognitive status: Within functional limits for tasks assessed   PALPATION: Not TTP   SENSATION: Light touch: Impaired   MUSCLE LENGTH: Hamstrings: Right 20d; deg; Left 20d deg  Thomas test: 1/2 joint tightness  POSTURE: flexed trunk    LUMBAR ROM:   AROM eval  Flexion 40%  Extension 0%  Right lateral flexion   Left lateral flexion   Right rotation   Left rotation    (Blank rows = not tested)  ! Indicates pain with testing  LOWER EXTREMITY ROM:     Active  Right eval Left eval  Hip flexion    Hip extension     Hip abduction    Hip adduction    Hip internal rotation    Hip external rotation    Knee flexion    Knee extension    Ankle dorsiflexion    Ankle plantarflexion    Ankle inversion    Ankle eversion     (Blank rows =  not tested)  ! Indicates pain with testing  LOWER EXTREMITY MMT:    MMT Right eval Left eval  Hip flexion 4 4  Hip extension 4 4  Hip abduction    Hip adduction    Hip internal rotation    Hip external rotation    Knee flexion    Knee extension    Ankle dorsiflexion    Ankle plantarflexion    Ankle inversion    Ankle eversion     (Blank rows = not tested)   ! Indicates pain with testing LUMBAR SPECIAL TESTS:  Unable to tolerate tests  FUNCTIONAL TESTS:  5 STS: 14.57  GAIT: Distance walked: 100' Assistive device utilized: None Level of assistance: Complete Independence Comments:   [facility] Adult PT Treatment:                                                DATE: 06/08/24 Therapeutic Exercise: Nustep L4 8 min (focus on varus correction) Neuromuscular re-ed: Supine hip fallouts GTB 15x B, 15/15 Bridge against GTB 15x Therapeutic Activity: Seated hamstring stretch 30s x2 B P-ball curl ups 15x B, 15/15 STS 10x arms extended  [facility] Adult PT Treatment:                                                DATE: 05/18/24 Therapeutic Exercise: Nustep level 5 x 5 mins while gathering subjective info and planning session with patient Standing hip abduction/extension x10 ea BIL Mini squats with UE support 2x10 Seated hamstring stretch 2x30 BIL Seated pball roll outs fwd/lat x10 ea STS arms crossed 2x10 Neuromuscular re-ed: Seated pball press downs 2 2x10 Posterior pelvic tilt 2x10 2 sec hold  [facility] Adult PT Treatment:                                                DATE: 05/11/24 Therapeutic Exercise: Nustep level 5 x 6 mins while gathering subjective info and planning session with patient Standing hip abduction/extension x10 ea BIL Mini squats with UE  support 2x10 Seated hamstring stretch 2x30 BIL Seated pball roll outs fwd/lat x10 ea Neuromuscular re-ed: Seated pball press downs 5 2x10  [facility] Adult PT Treatment:                                                DATE: 04/30/2024  Self Care: Pt education POC discussion  PATIENT EDUCATION:  Education details: Pt received education regarding HEP performance, ADL performance, functional activity tolerance, impairment education, appropriate performance of therapeutic activities.  Person educated: Patient Education method: Explanation, Demonstration, Tactile cues, Verbal cues, and Handouts Education comprehension: verbalized understanding and returned demonstration  HOME EXERCISE PROGRAM: Access Code: [email or web address] URL: [email or web address] Date: 06/08/2024 Prepared by: Reyes Kohut  Exercises - Bent Knee Fallouts  - 1 x daily - 4 x weekly - 3 sets - 12 reps - 2s hold - Abdominal Bracing  - 1 x daily - 4 x weekly - 2 sets - 10 reps - 6s hold - Seated Table Hamstring Stretch  - 2 x daily - 5 x weekly - 1 sets - 2 reps - 30s hold - Sit to Stand Without Arm Support  - 2 x daily - 5 x weekly - 1 sets - 10 reps ---------------------------------------------------------------------------------------------  ASSESSMENT:  CLINICAL IMPRESSION:  Focus of today's session was aerobic w/u followed by LE stretching and trunk/core strengthening.  HEP reviewed and updated as noted.  Emphasis on body mechanics, hip mobility and lumbosacral strengthening   Eval impression (04/30/2024): Pt. attended today's physical therapy session for evaluation of LBP. Pt has complaints of functional deconditioning and mobility following multilevel lumbar fusion on August 8th, 2025. Pt has notable deficits and would benefit from therapeutic focus on lumbar AROM, Proximal hip strength,  dynamic core activation patterns, and hip hinge mechanics.  Treatment performed today focused on pt education detailed in the objective. Pt demonstrated great understanding of education provided. required minimal v/t cues and no assistance for appropriate performance with today's activities. Pt requires the intervention of skilled outpatient physical therapy to address the aforementioned deficits and progress towards a functional level in line with therapeutic goals.    OBJECTIVE IMPAIRMENTS: Abnormal gait, decreased activity tolerance, decreased mobility, difficulty walking, decreased ROM, decreased strength, hypomobility, improper body mechanics, postural dysfunction, and pain.   ACTIVITY LIMITATIONS: carrying, lifting, bending, standing, squatting, transfers, bed mobility, and locomotion level  PARTICIPATION LIMITATIONS: meal prep, cleaning, laundry, interpersonal relationship, driving, shopping, and community activity  PERSONAL FACTORS: Age, Fitness, Past/current experiences, and Time since onset of injury/illness/exacerbation are also affecting patient's functional outcome.   REHAB POTENTIAL: Fair see personal factors  CLINICAL DECISION MAKING: Stable/uncomplicated  EVALUATION COMPLEXITY: Low   GOALS: Goals reviewed with patient? YES  SHORT TERM GOALS: Target date: 05/28/2024  Pt will be independent with administered HEP to demonstrate the competency necessary for long term managemnet of symptoms at home. Baseline: Goal status: INITIAL   LONG TERM GOALS: Target date: 06/25/2024  Pt. Will achieve a MODI score of 13/50 as to demonstrate improvement in self-perceived functional ability with daily activities.  Baseline: 19/50(38%) Goal status: INITIAL  2.  Pt will improve Global hip strength to a 4+/5 to demonstrate improvement in strength for quality of motion and activity performance.  Baseline:  Goal status: INITIAL  3.  Pt will improve Thoracolumbar AROM to 60% of  standardized norms with less than 3/10 pain to demonstrate necessary mobility for high quality and safe ADLs  Baseline: 5/10 Goal status: INITIAL  4.   Pt will independently ambulate 1000' with no AD and less than 3/10 pain to demonstrate improved weightbearing tolerance, BLE strength, and functional capacity for community ambulation.  Baseline:  Goal status: INITIAL  5.  Pt will demonstrate x5 squat to parallel using appropriate hip hinge technique necessary for ADL performance and compensatory functional motion in place of lumbar mobility. Baseline:  Goal status: INITIAL  ---------------------------------------------------------------------------------------------  PLAN:  PT FREQUENCY: 2x/week  PT DURATION: 8 weeks  PLANNED INTERVENTIONS: 97110-Therapeutic exercises, 97530- Therapeutic activity, 97112- Neuromuscular re-education, 97535- Self Care, [id number]- Manual therapy, [phone]- Gait training, [phone] (1-2 muscles), [phone] (3+ muscles)- Dry Needling, Patient/Family education, Balance training, Stair training, Taping, Joint mobilization, Spinal mobilization, DME instructions, Cryotherapy, and Moist heat.  PLAN FOR NEXT SESSION: Review HEP, Begin POC as detailed in assessment   Reyes CHRISTELLA Kohut PT  07/13/2024, 11:02 AM    "

## 2024-07-14 ENCOUNTER — Ambulatory Visit
